# Patient Record
Sex: Female | Born: 1960 | Race: Black or African American | Hispanic: No | Marital: Married | Smoking: Never smoker
Health system: Southern US, Community
[De-identification: ages and names within clinical notes are randomized; demographics above are authoritative.]

## PROBLEM LIST (undated history)

## (undated) DIAGNOSIS — H543 Unqualified visual loss, both eyes: Secondary | ICD-10-CM

## (undated) DIAGNOSIS — I639 Cerebral infarction, unspecified: Secondary | ICD-10-CM

## (undated) DIAGNOSIS — I10 Essential (primary) hypertension: Secondary | ICD-10-CM

## (undated) DIAGNOSIS — E119 Type 2 diabetes mellitus without complications: Secondary | ICD-10-CM

## (undated) HISTORY — PX: TONSILLECTOMY: SUR1361

## (undated) HISTORY — PX: ABDOMINAL HYSTERECTOMY: SHX81

---

## 2006-12-09 ENCOUNTER — Emergency Department: Payer: Self-pay | Admitting: Emergency Medicine

## 2014-12-06 ENCOUNTER — Inpatient Hospital Stay: Payer: Self-pay | Admitting: Internal Medicine

## 2014-12-06 LAB — COMPREHENSIVE METABOLIC PANEL
ALK PHOS: 93 U/L
ALT: 17 U/L
Albumin: 3.8 g/dL
Anion Gap: 8 (ref 7–16)
BUN: 26 mg/dL — ABNORMAL HIGH
Bilirubin,Total: 0.3 mg/dL
CREATININE: 1.29 mg/dL — AB
Calcium, Total: 8.9 mg/dL
Chloride: 101 mmol/L
Co2: 29 mmol/L
EGFR (African American): 54 — ABNORMAL LOW
EGFR (Non-African Amer.): 47 — ABNORMAL LOW
GLUCOSE: 114 mg/dL — AB
Potassium: 3.7 mmol/L
SGOT(AST): 36 U/L
SODIUM: 138 mmol/L
Total Protein: 8.1 g/dL

## 2014-12-06 LAB — CBC
HCT: 40 % (ref 35.0–47.0)
HGB: 13 g/dL (ref 12.0–16.0)
MCH: 28 pg (ref 26.0–34.0)
MCHC: 32.6 g/dL (ref 32.0–36.0)
MCV: 86 fL (ref 80–100)
Platelet: 329 10*3/uL (ref 150–440)
RBC: 4.65 10*6/uL (ref 3.80–5.20)
RDW: 14.8 % — ABNORMAL HIGH (ref 11.5–14.5)
WBC: 5.9 10*3/uL (ref 3.6–11.0)

## 2014-12-06 LAB — BASIC METABOLIC PANEL
ANION GAP: 6 — AB (ref 7–16)
BUN: 22 mg/dL — ABNORMAL HIGH
CO2: 25 mmol/L
Calcium, Total: 8.1 mg/dL — ABNORMAL LOW
Chloride: 102 mmol/L
Creatinine: 1.07 mg/dL — ABNORMAL HIGH
EGFR (Non-African Amer.): 58 — ABNORMAL LOW
GLUCOSE: 153 mg/dL — AB
Potassium: 3.6 mmol/L
Sodium: 133 mmol/L — ABNORMAL LOW

## 2014-12-06 LAB — URINALYSIS, COMPLETE
Bacteria: NONE SEEN
Bilirubin,UR: NEGATIVE
Glucose,UR: >=500 mg/dL (ref 0–75)
Hyaline Cast: 6
Ketone: NEGATIVE
LEUKOCYTE ESTERASE: NEGATIVE
NITRITE: NEGATIVE
Ph: 5 (ref 4.5–8.0)
Specific Gravity: 1.011 (ref 1.003–1.030)
Squamous Epithelial: 1
WBC UR: 1 /HPF (ref 0–5)

## 2014-12-06 LAB — TROPONIN I: Troponin-I: <0.03 ng/mL

## 2014-12-07 LAB — BASIC METABOLIC PANEL
Anion Gap: 6 — ABNORMAL LOW (ref 7–16)
BUN: 21 mg/dL — ABNORMAL HIGH
Calcium, Total: 8.3 mg/dL — ABNORMAL LOW
Chloride: 101 mmol/L
Co2: 27 mmol/L
Creatinine: 1.36 mg/dL — ABNORMAL HIGH
EGFR (African American): 51 — ABNORMAL LOW
EGFR (Non-African Amer.): 44 — ABNORMAL LOW
Glucose: 265 mg/dL — ABNORMAL HIGH
Potassium: 4.1 mmol/L
Sodium: 134 mmol/L — ABNORMAL LOW

## 2014-12-07 LAB — HEMOGLOBIN A1C: Hemoglobin A1C: 11.1 % — ABNORMAL HIGH

## 2014-12-07 LAB — TSH: Thyroid Stimulating Horm: 0.284 u[IU]/mL — ABNORMAL LOW

## 2015-01-10 NOTE — H&P (Signed)
PATIENT NAME:  [name] Gilbert, Hendrix MR#:  [id number] DATE OF BIRTH:  [birth year]  DATE OF ADMISSION:  12/06/2014  REFERRING PHYSICIAN: Greenwood SinkJade J. Dolores FrameSung, M.D.    PRIMARY CARE PHYSICIAN: Nonlocal.   ADMIT DIAGNOSIS: Hypoglycemia and hypothermia.   HISTORY OF PRESENT ILLNESS: This is a 54 year old, African American female, who presents to the Emergency Department via the EMS after her blood sugar was too low to detect on her home glucometer. The patient had been lying in bed eating dinner after taking her insulin, when she reported feeling very hot. When her family members touched her skin, they report that she actually felt icy cold. The patient was diaphoretic, but complained of no pain. She had no nausea, vomiting or diarrhea. When she became more and more lethargic, the family members called the EMS, who confirmed that her blood sugar was too low to read and gave the patient glucagon in the field. Upon arrival to the Emergency Department, she was found to have a blood sugar of 14, and was given an amp of D50, which improved her blood sugar to more than 100. However, the patient's blood sugar quickly dropped again to approximately 40. She was also found to be hypothermic, which prompted the Emergency Department to call for admission.   REVIEW OF SYSTEMS: The patient is very lethargic upon my physical examination, and thus cannot contribute to her review of systems. She is able to tell me that she is not hurting anywhere, nor is she short of breath. Her family members report that the patient did not complain of anything but feeling hot, as stated before.   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   PAST SURGICAL HISTORY: C-section.   SOCIAL HISTORY: The patient lives with her husband. She does not smoke, drink, or do any drugs.    FAMILY HISTORY: There are no chronic medical illnesses that run in the family that the patient's daughter is aware of.   MEDICATIONS:  1. Aspirin 81 mg 1 tablet p.o. daily.  2. Ciprofloxacin 500 mg 1 tablet p.o. every 12 hours. Prescription is complete.  3.   Gabapentin 300 mg 1 capsule p.o. daily as needed for pain.  4. Gabapentin 300 mg 2 capsules p.o. at bedtime.  5. Insulin glargine 55 units subcutaneously at bedtime.  6. Lisinopril 10 mg 1 tablet p.o. daily.  7. Lovastatin 40 mg 1 tablet p.o. daily.  8. Nitrofurantoin macrocrystals 100 mg 1 capsule p.o. b.i.d.   ALLERGIES: CODEINE AND PENICILLIN.   PERTINENT LABORATORY RESULTS AND RADIOGRAPHIC FINDINGS: Serum glucose is 114 following glucagon and D50. BUN is 26, creatinine 1.29, serum sodium 138, potassium is 3.7, chloride is 101, bicarbonate 29, calcium is 8.9, serum albumin is 3.8, alkaline phosphatase 93, AST 36, ALT 17.   Troponin is negative.   White blood cell count 5.9, hemoglobin 13, hematocrit 40, platelet count 329,000. MCV is 86.   Urinalysis is negative for infection.   Chest x-ray shows no acute cardiopulmonary process.   PHYSICAL EXAMINATION:  VITAL SIGNS: Temperature at the time of admission is 95.1, pulse 62, respirations 10, blood pressure 138/79, pulse oximetry is 100% on room air.  GENERAL: The patient is somnolent and lethargic. She is oriented to person and place. She is in no apparent distress.  HEENT: Normocephalic, atraumatic. Pupils are equal and round, but difficult to ascertain  reactivity to light. Mucous membranes are moist.  NECK: Trachea is midline. No adenopathy. Thyroid is nonpalpable and nontender.  CHEST: Symmetric and atraumatic.  CARDIOVASCULAR: Regular rate and rhythm. Normal S1, S2. No rubs, clicks, or murmurs appreciated.  LUNGS: Clear to auscultation bilaterally. Normal effort and excursion.  ABDOMEN: Positive bowel sounds. Soft, nontender, nondistended. No hepatosplenomegaly.   GENITOURINARY: Deferred.  MUSCULOSKELETAL: The patient does not cooperate in strength or range of motion examination. I have also not observed her gait, as she will not get out of bed.  SKIN: Warm and dry. There are no rashes or lesions.  EXTREMITIES: No clubbing, cyanosis; however, there is trace edema of the left lower extremity at the ankle.  NEUROLOGIC: Cranial nerves II, and then IV through XII, are grossly intact. The patient is unable to see, and thus cranial nerve III is not working properly.  PSYCHIATRIC: It is difficult to ascertain mood, affect, judgment, or insight as the patient is so lethargic.   ASSESSMENT AND PLAN: This 54 year old female is admitted for hypoglycemia and hypothermia.   1. Hypoglycemia. Initially, the patient's blood sugar was 14 despite glucagon and D50. It was persistently low even after starting D5- half normal saline in the Emergency Department so I have switched her to D10W. We will monitor her blood sugar every hour for the next 3 hours. If it improves, we can extend the interval that we check her blood sugar. The patient does not meet septic criteria, at this time, but has recently completed antibiotics for a urinary tract infection. She does not appear to have a urinary tract infection at this juncture, but given her clinical findings, I will give 1 dose of ceftriaxone and vancomycin. Also, it is unclear as to the etiology of her hypoglycemia, at this time. The patient's daughter reports that the primary care doctor has written for a new version of her 70/30 insulin that she had been taking. This is not the same version of insulin that she has been taking. We need to clarify the type and dose of insulin the patient has been prescribed and adjust as necessary.  2. Hypothermia. The patient has a warming blanket on, at this time, and we will continue per protocol until her core temperature is improved. 3. Diabetes type 2. We will hold the patient's insulin until her  blood sugar is consistently improved.  4. Hypertension. Continue lisinopril.  5. Hyperlipidemia. Continue statins.  6. Cerebrovascular accident. The patient is currently blind. It is unclear if she has cortical blindness at this time. In better lighting conditions and when the patient is more cooperative, we will do a more thorough ophthalmologic examination.  7. Deep vein thrombosis prophylaxis: Heparin.  8. Gastrointestinal prophylaxis: None.   CODE STATUS: The patient is a FULL CODE.   TIME SPENT ON ADMISSION ORDERS AND CRITICAL PATIENT CARE: Approximately 45 minutes.    ____________________________ Kelton Pillar. Sheryle Hail, MD msd:JT D: 12/06/2014 09:54:28 ET T: 12/06/2014 10:26:52 ET JOB#: [id number]  cc: Kelton Pillar. Sheryle Hail, MD, <Dictator> Kelton Pillar Mariachristina Holle MD ELECTRONICALLY SIGNED 12/07/2014 0:33

## 2015-01-10 NOTE — Discharge Summary (Signed)
PATIENT NAME:  [name] Gilbert, [name] MR#:  [id number] DATE OF BIRTH:  1960/12/15  DATE OF ADMISSION:  12/06/2014 DATE OF DISCHARGE:  12/07/2014  For a detailed note, please take a look at the history and physical done on admission by Dr. Joycelyn RuaMichael Diamond.   DISCHARGE DIAGNOSES:  1.  Altered mental status, likely metabolic encephalopathy from hypoglycemia.  2.  Hypoglycemia, hypertension, diabetic neuropathy, diabetes, history of previous cerebrovascular accident.   DISCHARGE DIET: The patient is being discharged on a low-sodium, low-fat, carbohydrate-Controlled diet.   DISCHARGE ACTIVITY: As tolerated.   DISCHARGE FOLLOWUP: [facility] in the next 1 to 2 weeks.   DISCHARGE MEDICATIONS: Aspirin 81 mg daily, gabapentin 300 mg daily as needed, lisinopril 10 mg daily, lovastatin 40 mg daily, gabapentin 300 mg 2 caps at bedtime, Novolin 70/30 20 units in the morning and 10 units in the evening.   PERTINENT STUDIES DONE DURING THE HOSPITAL COURSE: A CT scan of the head showing prior left PCA territory stroke with encephalomalacia.   HOSPITAL COURSE: This is a 54 year old female with medical problems as mentioned above who presented to the hospital with altered mental status and noted to be severely hypoglycemic with blood sugars in the teens.  1.  Altered mental status. This was likely metabolic encephalopathy secondary from the severe hypoglycemia. The patient was placed on a D10 drip. Her blood sugars have remained much improved in the past 24 hours. Her mental status is back to baseline. Therefore, she is being discharged home. Her CT head showed old strokes, but no evidence of acute CVA presently.  2.  Hypoglycemia. This was likely the cause of the patient's altered mental status. The patient had recently been switched from Lantus to Novolin 70/30, but she was not really monitoring her blood sugars very well, and she was not eating very well prior to coming in.  That is probably what led to her hypoglycemia. At this point, I am reducing her dose of Novolin 70/30 from 40 units to 20 units in the morning and from 20 units in the evening to 10 units in the evening and further titrations to her insulin can be done as an outpatient.  3.  Acute renal failure. We do not have a baseline creatinine to compare with. She probably has underlying chronic kidney disease. This can be further followed up as an outpatient. 4.  Hyponatremia. This was likely secondary to mild hypoglycemia and has since then improved and resolved.  5.  Hypothermia. This was thought to be initially secondary to sepsis, but that has been ruled out. Her hypothermia cause is probably her hypoglycemia, which is now improved and her temperature is stable.   CODE STATUS: FULL.  DISPOSITION: She is being discharged home.   TIME SPENT ON DISCHARGE: 40 minutes. ____________________________ Rolly PancakeVivek J. Cherlynn KaiserSainani, MD vjs:sb D: 12/07/2014 16:30:57 ET T: 12/08/2014 07:33:09 ET JOB#: [id number]  cc: Rolly PancakeVivek J. Cherlynn KaiserSainani, MD, <Dictator> Houston SirenVIVEK J Chakira Jachim MD ELECTRONICALLY SIGNED 12/17/2014 16:19

## 2015-05-03 ENCOUNTER — Emergency Department: Payer: BLUE CROSS/BLUE SHIELD

## 2015-05-03 ENCOUNTER — Encounter: Payer: Self-pay | Admitting: Emergency Medicine

## 2015-05-03 ENCOUNTER — Other Ambulatory Visit: Payer: Self-pay

## 2015-05-03 ENCOUNTER — Emergency Department
Admission: EM | Admit: 2015-05-03 | Discharge: 2015-05-03 | Disposition: A | Discharge status: Home / Self Care | Payer: BLUE CROSS/BLUE SHIELD | Attending: Emergency Medicine | Admitting: Emergency Medicine

## 2015-05-03 DIAGNOSIS — M6281 Muscle weakness (generalized): Secondary | ICD-10-CM | POA: Insufficient documentation

## 2015-05-03 DIAGNOSIS — G43809 Other migraine, not intractable, without status migrainosus: Secondary | ICD-10-CM

## 2015-05-03 DIAGNOSIS — R519 Headache, unspecified: Secondary | ICD-10-CM

## 2015-05-03 DIAGNOSIS — R4182 Altered mental status, unspecified: Secondary | ICD-10-CM | POA: Diagnosis present

## 2015-05-03 DIAGNOSIS — Z79899 Other long term (current) drug therapy: Secondary | ICD-10-CM | POA: Diagnosis not present

## 2015-05-03 DIAGNOSIS — Z880 Allergy status to penicillin: Secondary | ICD-10-CM | POA: Insufficient documentation

## 2015-05-03 DIAGNOSIS — Z794 Long term (current) use of insulin: Secondary | ICD-10-CM | POA: Diagnosis not present

## 2015-05-03 DIAGNOSIS — E119 Type 2 diabetes mellitus without complications: Secondary | ICD-10-CM | POA: Insufficient documentation

## 2015-05-03 DIAGNOSIS — R531 Weakness: Secondary | ICD-10-CM

## 2015-05-03 DIAGNOSIS — R51 Headache: Secondary | ICD-10-CM

## 2015-05-03 DIAGNOSIS — I10 Essential (primary) hypertension: Secondary | ICD-10-CM | POA: Insufficient documentation

## 2015-05-03 HISTORY — DX: Cerebral infarction, unspecified: I63.9

## 2015-05-03 HISTORY — DX: Type 2 diabetes mellitus without complications: E11.9

## 2015-05-03 HISTORY — DX: Essential (primary) hypertension: I10

## 2015-05-03 LAB — LACTIC ACID, PLASMA: LACTIC ACID, VENOUS: 1.5 mmol/L (ref 0.5–2.0)

## 2015-05-03 LAB — CBC WITH DIFFERENTIAL/PLATELET
BASOS PCT: 1 %
Basophils Absolute: 0.1 10*3/uL (ref 0–0.1)
EOS ABS: 0 10*3/uL (ref 0–0.7)
Eosinophils Relative: 1 %
HCT: 42.4 % (ref 35.0–47.0)
Hemoglobin: 13.8 g/dL (ref 12.0–16.0)
Lymphocytes Relative: 19 %
Lymphs Abs: 1.8 10*3/uL (ref 1.0–3.6)
MCH: 27.8 pg (ref 26.0–34.0)
MCHC: 32.5 g/dL (ref 32.0–36.0)
MCV: 85.8 fL (ref 80.0–100.0)
MONO ABS: 0.3 10*3/uL (ref 0.2–0.9)
MONOS PCT: 3 %
Neutro Abs: 7.4 10*3/uL — ABNORMAL HIGH (ref 1.4–6.5)
Neutrophils Relative %: 76 %
PLATELETS: 319 10*3/uL (ref 150–440)
RBC: 4.94 MIL/uL (ref 3.80–5.20)
RDW: 15 % — AB (ref 11.5–14.5)
WBC: 9.6 10*3/uL (ref 3.6–11.0)

## 2015-05-03 LAB — COMPREHENSIVE METABOLIC PANEL
ALK PHOS: 106 U/L (ref 38–126)
ALT: 22 U/L (ref 14–54)
AST: 35 U/L (ref 15–41)
Albumin: 4.2 g/dL (ref 3.5–5.0)
Anion gap: 11 (ref 5–15)
BUN: 19 mg/dL (ref 6–20)
CALCIUM: 9.2 mg/dL (ref 8.9–10.3)
CO2: 28 mmol/L (ref 22–32)
CREATININE: 1.2 mg/dL — AB (ref 0.44–1.00)
Chloride: 98 mmol/L — ABNORMAL LOW (ref 101–111)
GFR calc non Af Amer: 50 mL/min — ABNORMAL LOW (ref >60)
GFR, EST AFRICAN AMERICAN: 57 mL/min — AB (ref >60)
GLUCOSE: 169 mg/dL — AB (ref 65–99)
Potassium: 4.2 mmol/L (ref 3.5–5.1)
SODIUM: 137 mmol/L (ref 135–145)
Total Bilirubin: 1.2 mg/dL (ref 0.3–1.2)
Total Protein: 9 g/dL — ABNORMAL HIGH (ref 6.5–8.1)

## 2015-05-03 LAB — URINALYSIS COMPLETE WITH MICROSCOPIC (ARMC ONLY)
Bilirubin Urine: NEGATIVE
GLUCOSE, UA: NEGATIVE mg/dL
Leukocytes, UA: NEGATIVE
Nitrite: POSITIVE — AB
Protein, ur: >500 mg/dL — AB
Specific Gravity, Urine: 1.015 (ref 1.005–1.030)
pH: 6 (ref 5.0–8.0)

## 2015-05-03 LAB — GLUCOSE, CAPILLARY: GLUCOSE-CAPILLARY: 122 mg/dL — AB (ref 65–99)

## 2015-05-03 LAB — TROPONIN I: TROPONIN I: 0.04 ng/mL — AB (ref <0.031)

## 2015-05-03 MED ORDER — ACETAMINOPHEN 325 MG PO TABS
650.0000 mg | ORAL_TABLET | Freq: Once | ORAL | Status: AC
  Administered 2015-05-03: 650 mg via ORAL
  Filled 2015-05-03: qty 2

## 2015-05-03 MED ORDER — SODIUM CHLORIDE 0.9 % IV BOLUS (SEPSIS)
1000.0000 mL | Freq: Once | INTRAVENOUS | Status: AC
  Administered 2015-05-03: 1000 mL via INTRAVENOUS

## 2015-05-03 MED ORDER — METOCLOPRAMIDE HCL 5 MG/ML IJ SOLN
10.0000 mg | Freq: Once | INTRAMUSCULAR | Status: AC
  Administered 2015-05-03: 10 mg via INTRAVENOUS
  Filled 2015-05-03: qty 2

## 2015-05-03 MED ORDER — MAGNESIUM SULFATE 2 GM/50ML IV SOLN
2.0000 g | Freq: Once | INTRAVENOUS | Status: AC
  Administered 2015-05-03: 2 g via INTRAVENOUS
  Filled 2015-05-03: qty 50

## 2015-05-03 NOTE — Discharge Instructions (Signed)
It is unclear what caused her general weakness, but we discussed the possibility that this was an atypical migraine. Your blood tests, CT scan of your head, and vital signs, were all within reason. He felt better after we gave the medicine to treat her migraine. Follow-up with your regular doctor. Return to the emergency department if you have severe headache, focal weakness, or other urgent concerns.   Migraine Headache A migraine headache is very bad, throbbing pain on one or both sides of your head. Talk to your doctor about what things may bring on (trigger) your migraine headaches. HOME CARE  Only take medicines as told by your doctor.  Lie down in a dark, quiet room when you have a migraine.  Keep a journal to find out if certain things bring on migraine headaches. For example, write down:  What you eat and drink.  How much sleep you get.  Any change to your diet or medicines.  Lessen how much alcohol you drink.  Quit smoking if you smoke.  Get enough sleep.  Lessen any stress in your life.  Keep lights dim if bright lights bother you or make your migraines worse. GET HELP RIGHT AWAY IF:   Your migraine becomes really bad.  You have a fever.  You have a stiff neck.  You have trouble seeing.  Your muscles are weak, or you lose muscle control.  You lose your balance or have trouble walking.  You feel like you will pass out (faint), or you pass out.  You have really bad symptoms that are different than your first symptoms. MAKE SURE YOU:   Understand these instructions.  Will watch your condition.  Will get help right away if you are not doing well or get worse. Document Released: 06/06/2008 Document Revised: 11/20/2011 Document Reviewed: 05/05/2013 [facility] Patient Information 2015 [place], [place]. This information is not intended to replace advice given to you by your health care provider. Make sure you discuss any questions you have with your health care  provider.

## 2015-05-03 NOTE — ED Notes (Signed)
Pt transported to CT via stretcher with Clydie Braun, RN on telemetry

## 2015-05-03 NOTE — ED Notes (Signed)
Dr Carollee Massed aware of elevated troponin, no additional orders at this time.

## 2015-05-03 NOTE — ED Notes (Signed)
Patients husband house # [phone] ;  Cell # [phone]  [name])

## 2015-05-03 NOTE — ED Notes (Signed)
Pts husband states pt was last seen well at 1030 last pm, states this am, his wife was not acting right, cold, not responding to verbal stimuli, states she vomited and passed out at some point this am. Grips weak and equal. Pt weak upon standing from car to chair. Had a headache last night however, denies ha now.

## 2015-05-03 NOTE — ED Provider Notes (Addendum)
[facility] Emergency Department Provider Note  ____________________________________________  Time seen: 8:05 AM  I have reviewed the triage vital signs and the nursing notes.   HISTORY  Chief Complaint Altered Mental Status     HPI [name] Gilbert is a 54 y.o. female who has a history of a stroke in the past now presents with some altered mental status and general weakness. Apparently she became nauseous and was vomiting last night. She reports she had a syncopal episode. Her husband, who works third shift, had called her this morning and she told him he needed to come home. When he got there he found her less communicative but still verbal. He thought her sugar may be low and he checked that. It was normal. The patient was subsequently brought emergency department.  The patient denies any focal pain. She is able to tell me "I just don't feel right". She is soft-spoken. She does follow commands and moves all 4 extremities, although with notable weakness.   Past medical history: Hypertension CVA Diabetes  There are no active problems to display for this patient.   History reviewed. No pertinent past surgical history.  Current Outpatient Rx  Name  Route  Sig  Dispense  Refill  . atorvastatin (LIPITOR) 40 MG tablet   Oral   Take 40 mg by mouth daily.         . carvedilol (COREG) 12.5 MG tablet   Oral   Take 12.5 mg by mouth 2 (two) times daily with a meal.         . insulin NPH-regular Human (NOVOLIN 70/30) (70-30) 100 UNIT/ML injection   Subcutaneous   Inject 10-18 Units into the skin 2 (two) times daily with a meal. 10 units in the morning and 18 units in the evening.         Marland Kitchen lisinopril-hydrochlorothiazide (PRINZIDE,ZESTORETIC) 20-12.5 MG per tablet   Oral   Take 2 tablets by mouth daily.           Allergies Ampicillin; Penicillins; Amoxicillin; Codeine; Metformin; and Statins  History reviewed. No pertinent family  history.  Social History Social History  Substance Use Topics  . Smoking status: Never Smoker   . Smokeless tobacco: None  . Alcohol Use: None    Review of Systems  Constitutional: Negative for fever but patient felt cold ENT: Negative for sore throat. Cardiovascular: Negative for chest pain. Respiratory: Negative for shortness of breath. Gastrointestinal: Negative for abdominal pain, vomiting and diarrhea. Genitourinary: Negative for dysuria. Musculoskeletal: No myalgias or injuries. Skin: Negative for rash. Neurological: Negative for headaches   10-point ROS otherwise negative.  ____________________________________________   PHYSICAL EXAM:  VITAL SIGNS: ED Triage Vitals  Enc Vitals Group     BP 05/03/15 0810 140/83 mmHg     Pulse Rate 05/03/15 0810 97     Resp 05/03/15 0810 18     Temp 05/03/15 0810 98.4 F (36.9 C)     Temp Source 05/03/15 0810 Oral     SpO2 05/03/15 0810 98 %     Weight 05/03/15 0810 180 lb (81.647 kg)     Height 05/03/15 0810  (1.676 m)     Head Cir --      Peak Flow --      Pain Score --      Pain Loc --      Pain Edu? --      Excl. in GC? --     Constitutional: Very soft spoken. She opens  her eyes but not much. She has not re-interactive. She appears overall lethargic. I need to get fairly close to her to understand her speech. She is basically able to speak at a level just above a whisper to tell me that she just doesn't feel right. She does answer questions appropriately and has an intact thought process. ENT   Head: Normocephalic and atraumatic.   Nose: No congestion/rhinnorhea.   Mouth/Throat: Mucous membranes are moist. Cardiovascular: Normal rate at 86, regular rhythm, no murmur noted Respiratory:  Normal respiratory effort, no tachypnea.    Breath sounds are clear and equal bilaterally.  Gastrointestinal: Soft and nontender. No distention.  Back: No muscle spasm, no tenderness, no CVA tenderness. Musculoskeletal:  No deformity noted. Nontender with normal range of motion in all extremities.  No noted edema. Neurologic:  Overall lethargic. 4+ out of 5 strength in all 4 extremities. Sensation intact. Skin:  Skin is warm, dry. No rash noted. Psychiatric: Quiet, lethargic, but with intact thought process.  ____________________________________________    LABS (pertinent positives/negatives)  Labs Reviewed  COMPREHENSIVE METABOLIC PANEL - Abnormal; Notable for the following:    Chloride 98 (*)    Glucose, Bld 169 (*)    Creatinine, Ser 1.20 (*)    Total Protein 9.0 (*)    GFR calc non Af Amer 50 (*)    GFR calc Af Amer 57 (*)    All other components within normal limits  CBC WITH DIFFERENTIAL/PLATELET - Abnormal; Notable for the following:    RDW 15.0 (*)    Neutro Abs 7.4 (*)    All other components within normal limits  TROPONIN I - Abnormal; Notable for the following:    Troponin I 0.04 (*)    All other components within normal limits  URINALYSIS COMPLETEWITH MICROSCOPIC ([facility] ONLY) - Abnormal; Notable for the following:    Color, Urine YELLOW (*)    APPearance HAZY (*)    Ketones, ur TRACE (*)    Hgb urine dipstick 2+ (*)    Protein, ur >500 (*)    Nitrite POSITIVE (*)    Bacteria, UA MANY (*)    Squamous Epithelial / LPF 0-5 (*)    All other components within normal limits  GLUCOSE, CAPILLARY - Abnormal; Notable for the following:    Glucose-Capillary 122 (*)    All other components within normal limits  LACTIC ACID, PLASMA  LACTIC ACID, PLASMA     ____________________________________________   EKG    ____________________________________________    RADIOLOGY   FINDINGS: Bilateral occipital infarcts, left larger than right. No acute intracranial abnormality. Specifically, no hemorrhage, hydrocephalus, mass lesion, acute infarction, or significant intracranial injury. No acute calvarial abnormality. Visualized paranasal sinuses and mastoids clear. Orbital soft  tissues unremarkable.  IMPRESSION: No acute intracranial abnormality.   ____________________________________________ ____________________________________________   INITIAL IMPRESSION / ASSESSMENT AND PLAN / ED COURSE  Pertinent labs & imaging results that were available during my care of the patient were reviewed by me and considered in my medical decision making (see chart for details).  54 year old female with altered mental status and general weakness. It is unclear the cause. She has had a stroke before. This weakness does not appear to have a focal nature to it. CT scan and labwork pending.  ----------------------------------------- 12:48 PM on 05/03/2015 -----------------------------------------  Blood tests overall look okay. Patient may have a urinary tract infection with 6-30 white blood cells, nitrite positive, and many bacteria. Her CT scan was negative.  Patient's vital signs continued  to be quite normal. She is unchanged currently, with ongoing general weakness and a soft voice. She complains of a headache. Upon further discussion, the patient reports she used to have migraine sometime ago. With all other tests and evaluation overall looking well, I suspect she may have a migraine. We will treat her with Reglan and magnesium.   ----------------------------------------- 3:40 PM on 05/03/2015 -----------------------------------------  After treatment for migraine, the patient looks much better. She reports she feels better. She is more alert and communicative. Her voice and strength seem to be stronger. She has eaten some food. Her husband reports that she looks much better as well as. The patient feels ready to go home.  We have discussed further the possibility that this was a migraine. We she will follow-up with her primary doctor at [facility]. ____________________________________________   FINAL CLINICAL IMPRESSION(S) / ED DIAGNOSES  Final diagnoses:  General weakness   Other type of migraine  Acute nonintractable headache, unspecified headache type      Darien Ramus, MD 05/03/15 1545  Darien Ramus, MD 05/03/15 [phone]

## 2015-12-13 ENCOUNTER — Emergency Department
Admission: EM | Admit: 2015-12-13 | Discharge: 2015-12-13 | Disposition: A | Discharge status: Home / Self Care | Payer: BLUE CROSS/BLUE SHIELD | Attending: Emergency Medicine | Admitting: Emergency Medicine

## 2015-12-13 ENCOUNTER — Emergency Department: Payer: BLUE CROSS/BLUE SHIELD

## 2015-12-13 ENCOUNTER — Encounter: Payer: Self-pay | Admitting: *Deleted

## 2015-12-13 DIAGNOSIS — E11649 Type 2 diabetes mellitus with hypoglycemia without coma: Secondary | ICD-10-CM | POA: Insufficient documentation

## 2015-12-13 DIAGNOSIS — R748 Abnormal levels of other serum enzymes: Secondary | ICD-10-CM | POA: Insufficient documentation

## 2015-12-13 DIAGNOSIS — Z794 Long term (current) use of insulin: Secondary | ICD-10-CM | POA: Diagnosis not present

## 2015-12-13 DIAGNOSIS — N390 Urinary tract infection, site not specified: Secondary | ICD-10-CM | POA: Diagnosis not present

## 2015-12-13 DIAGNOSIS — Z79899 Other long term (current) drug therapy: Secondary | ICD-10-CM | POA: Insufficient documentation

## 2015-12-13 DIAGNOSIS — E162 Hypoglycemia, unspecified: Secondary | ICD-10-CM

## 2015-12-13 DIAGNOSIS — Z8673 Personal history of transient ischemic attack (TIA), and cerebral infarction without residual deficits: Secondary | ICD-10-CM | POA: Insufficient documentation

## 2015-12-13 DIAGNOSIS — R4189 Other symptoms and signs involving cognitive functions and awareness: Secondary | ICD-10-CM

## 2015-12-13 DIAGNOSIS — I10 Essential (primary) hypertension: Secondary | ICD-10-CM | POA: Insufficient documentation

## 2015-12-13 LAB — LIPASE, BLOOD: LIPASE: 14 U/L (ref 11–51)

## 2015-12-13 LAB — ACETAMINOPHEN LEVEL: Acetaminophen (Tylenol), Serum: <10 ug/mL — ABNORMAL LOW (ref 10–30)

## 2015-12-13 LAB — PROTIME-INR
INR: 1.01
Prothrombin Time: 13.5 seconds (ref 11.4–15.0)

## 2015-12-13 LAB — URINALYSIS COMPLETE WITH MICROSCOPIC (ARMC ONLY)
BILIRUBIN URINE: NEGATIVE
GLUCOSE, UA: 50 mg/dL — AB
KETONES UR: NEGATIVE mg/dL
Leukocytes, UA: NEGATIVE
Nitrite: NEGATIVE
PH: 6 (ref 5.0–8.0)
Protein, ur: 100 mg/dL — AB
Specific Gravity, Urine: 1.01 (ref 1.005–1.030)

## 2015-12-13 LAB — GLUCOSE, CAPILLARY
GLUCOSE-CAPILLARY: 163 mg/dL — AB (ref 65–99)
GLUCOSE-CAPILLARY: 135 mg/dL — AB (ref 65–99)
GLUCOSE-CAPILLARY: 139 mg/dL — AB (ref 65–99)
GLUCOSE-CAPILLARY: 132 mg/dL — AB (ref 65–99)
Glucose-Capillary: 55 mg/dL — ABNORMAL LOW (ref 65–99)
Glucose-Capillary: 53 mg/dL — ABNORMAL LOW (ref 65–99)
Glucose-Capillary: 73 mg/dL (ref 65–99)

## 2015-12-13 LAB — AMMONIA: Ammonia: 38 umol/L — ABNORMAL HIGH (ref 9–35)

## 2015-12-13 LAB — COMPREHENSIVE METABOLIC PANEL
ALBUMIN: 4.1 g/dL (ref 3.5–5.0)
ALT: 327 U/L — ABNORMAL HIGH (ref 14–54)
ANION GAP: 6 (ref 5–15)
AST: 375 U/L — ABNORMAL HIGH (ref 15–41)
Alkaline Phosphatase: 197 U/L — ABNORMAL HIGH (ref 38–126)
BUN: 26 mg/dL — ABNORMAL HIGH (ref 6–20)
CO2: 25 mmol/L (ref 22–32)
Calcium: 9 mg/dL (ref 8.9–10.3)
Chloride: 106 mmol/L (ref 101–111)
Creatinine, Ser: 1.12 mg/dL — ABNORMAL HIGH (ref 0.44–1.00)
GFR calc non Af Amer: 54 mL/min — ABNORMAL LOW (ref >60)
GLUCOSE: 51 mg/dL — AB (ref 65–99)
POTASSIUM: 3.8 mmol/L (ref 3.5–5.1)
SODIUM: 137 mmol/L (ref 135–145)
TOTAL PROTEIN: 9.3 g/dL — AB (ref 6.5–8.1)
Total Bilirubin: 0.6 mg/dL (ref 0.3–1.2)

## 2015-12-13 LAB — CBC
HEMATOCRIT: 40.4 % (ref 35.0–47.0)
Hemoglobin: 13.6 g/dL (ref 12.0–16.0)
MCH: 29.1 pg (ref 26.0–34.0)
MCHC: 33.6 g/dL (ref 32.0–36.0)
MCV: 86.5 fL (ref 80.0–100.0)
PLATELETS: 246 10*3/uL (ref 150–440)
RBC: 4.68 MIL/uL (ref 3.80–5.20)
RDW: 15.4 % — AB (ref 11.5–14.5)
WBC: 9.1 10*3/uL (ref 3.6–11.0)

## 2015-12-13 LAB — APTT: aPTT: 30 seconds (ref 24–36)

## 2015-12-13 LAB — TROPONIN I: Troponin I: <0.03 ng/mL (ref <0.031)

## 2015-12-13 LAB — LACTIC ACID, PLASMA
LACTIC ACID, VENOUS: 1.6 mmol/L (ref 0.5–2.0)
LACTIC ACID, VENOUS: 1.5 mmol/L (ref 0.5–2.0)

## 2015-12-13 MED ORDER — DEXTROSE 50 % IV SOLN
INTRAVENOUS | Status: AC
  Administered 2015-12-13: 50 mL via INTRAVENOUS
  Filled 2015-12-13: qty 50

## 2015-12-13 MED ORDER — LEVOFLOXACIN 750 MG PO TABS
750.0000 mg | ORAL_TABLET | Freq: Every day | ORAL | Status: DC
Start: 2015-12-13 — End: 2016-01-28

## 2015-12-13 MED ORDER — LEVOFLOXACIN IN D5W 750 MG/150ML IV SOLN
750.0000 mg | Freq: Once | INTRAVENOUS | Status: AC
  Administered 2015-12-13: 750 mg via INTRAVENOUS
  Filled 2015-12-13: qty 150

## 2015-12-13 MED ORDER — LORAZEPAM 2 MG/ML IJ SOLN
1.0000 mg | Freq: Once | INTRAMUSCULAR | Status: AC
  Administered 2015-12-13: 1 mg via INTRAVENOUS

## 2015-12-13 MED ORDER — DEXTROSE 50 % IV SOLN
12.5000 g | Freq: Once | INTRAVENOUS | Status: AC
  Administered 2015-12-13: 50 mL via INTRAVENOUS

## 2015-12-13 NOTE — ED Provider Notes (Signed)
-----------------------------------------   11:03 AM on 12/13/2015 -----------------------------------------  Care was assumed Dr. Zenda AlpersWebster at approximately 9 AM pending results of ultrasound as well as additional lab work for evaluation of transaminitis, possibly hepatitis. Ultrasound shows possibly fatty infiltration with a question of trace ascites but no biliary ductal dilatation. [place] is mildly elevated at 38 but coags are normal also she has preserved synthetic function of the liver. Tylenol level is undetectable and patient reports that she has only taken 2 extra shaved Tylenol over the past 3 days. Repeat glucose is 132. She has not required any additional dextrose. She reports that she feels well and is requesting discharge patient is awake, alert, oriented in no acute distress with no complaints. I discussed return precautions with her, need for close PCP follow-up with likely GI follow-up for elevation of her AST and ALT and she voices understanding of this. DC with Levaquin for UTI.  Gayla DossEryka A Soriyah Osberg, MD 12/13/15 1115

## 2015-12-13 NOTE — Discharge Instructions (Signed)
You were seen in the emergency department for hypoglycemic episodes and were found to have elevation of your liver function tests as well as a urinary tract infection. Take all medications as prescribed and [facility] as possible. Return immediately to the emergency department if you develop any change in the color of your skin, abdominal pain, vomiting, diarrhea, fevers, chills, chest pain, back pain, difficulty breathing or for any other concerns.

## 2015-12-13 NOTE — ED Notes (Signed)
Pt given crackers and peanut butters, juice and applesauce.

## 2015-12-13 NOTE — ED Notes (Signed)
Pt woke husband at 0400 stated sugar was low, CBG at home 42. Given sprite prior to arrival. CBG at triage 55. Pt is tremorous and hypertensive.

## 2015-12-13 NOTE — ED Notes (Signed)
Patient ambulatory to bathroom.

## 2015-12-13 NOTE — ED Provider Notes (Signed)
[facility] Emergency Department Provider Note  ____________________________________________  Time seen: Approximately 426 AM  I have reviewed the triage vital signs and the nursing notes.   HISTORY  Chief Complaint Hypoglycemia    HPI [name] Gilbert is a 55 y.o. female who comes into the hospital today unresponsive. The patient's husband reports that he woke up saying that she did not feel right. She told her husband to touch her and her hands felt cold. The patient's husband checked her blood sugar and is found to be 42. He reports he gave her some Sprite and then her into the hospital. He reports that she has a history of diabetes as well as some strokes in the past and has some vision and memory challenges but she typically is verbal and can move around on her own. When she woke up at 4 AM she was moving and she was talking to her husband. She had no other complaints at the time. The husband denies any weakness at the patient has. She's not had any other symptoms as well. He was concerned so he brought her in for evaluation.   Past Medical History  Diagnosis Date  . Diabetes mellitus without complication (HCC)   . Hypertension   . Stroke [facility](HCC)     There are no active problems to display for this patient.   History reviewed. No pertinent past surgical history.  Current Outpatient Rx  Name  Route  Sig  Dispense  Refill  . atorvastatin (LIPITOR) 40 MG tablet   Oral   Take 40 mg by mouth daily.         . carvedilol (COREG) 12.5 MG tablet   Oral   Take 12.5 mg by mouth 2 (two) times daily with a meal.         . insulin NPH-regular Human (NOVOLIN 70/30) (70-30) 100 UNIT/ML injection   Subcutaneous   Inject 10-18 Units into the skin 2 (two) times daily with a meal. 10 units in the morning and 18 units in the evening.         Marland Kitchen. lisinopril-hydrochlorothiazide (PRINZIDE,ZESTORETIC) 20-12.5 MG per tablet   Oral   Take 2 tablets by mouth daily.            Allergies Ampicillin; Penicillins; Amoxicillin; Codeine; Metformin; and Statins  History reviewed. No pertinent family history.  Social History Social History  Substance Use Topics  . Smoking status: Never Smoker   . Smokeless tobacco: None  . Alcohol Use: None    Review of Systems Constitutional: Decreased responsiveness Eyes: No visual changes. ENT: No sore throat. Cardiovascular: Denies chest pain. Respiratory: Denies shortness of breath. Gastrointestinal: No abdominal pain.  No nausea, no vomiting.  No diarrhea.  No constipation. Genitourinary: Negative for dysuria. Musculoskeletal: Negative for back pain. Skin: Negative for rash. Neurological: Negative for headaches, focal weakness or numbness.  10-point ROS otherwise negative.  ____________________________________________   PHYSICAL EXAM:  VITAL SIGNS: ED Triage Vitals  Enc Vitals Group     BP 12/13/15 0430 225/111 mmHg     Pulse Rate 12/13/15 0430 95     Resp 12/13/15 0430 24     Temp 12/13/15 0628 97.8 F (36.6 C)     Temp Source 12/13/15 0628 Oral     SpO2 12/13/15 0430 100 %     Weight 12/13/15 0430 190 lb (86.183 kg)     Height 12/13/15 0430 5\' 2"  (1.575 m)     Head Cir --  Peak Flow --      Pain Score --      Pain Loc --      Pain Edu? --      Excl. in GC? --     Constitutional: Alert and not oriented and not responding sitting in the stretcher without responding.. Well appearing and in moderate distress. Eyes: Conjunctivae are normal. PERRL. EOMI. Head: Atraumatic. Nose: No congestion/rhinnorhea. Mouth/Throat: Mucous membranes are moist.  Oropharynx non-erythematous. Cardiovascular: Normal rate, regular rhythm. Grossly normal heart sounds.  Good peripheral circulation. Respiratory: Normal respiratory effort.  No retractions. Lungs CTAB. Gastrointestinal: Soft and nontender. No distention. Positive bowel sounds Musculoskeletal: No lower extremity tenderness nor edema.    Neurologic:  The patient was brought back to sitting in a wheelchair and staring. The patient is breathing without difficulty and she does move but does not follow commands. I did ask the patient she had pain and she did shake her head no when asked other questions the patient again is not responding. The patient also had some shaking episodes. Skin:  Skin is warm, dry and intact.  Psychiatric: Minimal responsiveness and staring off  ____________________________________________   LABS (all labs ordered are listed, but only abnormal results are displayed)  Labs Reviewed  CBC - Abnormal; Notable for the following:    RDW 15.4 (*)    All other components within normal limits  COMPREHENSIVE METABOLIC PANEL - Abnormal; Notable for the following:    Glucose, Bld 51 (*)    BUN 26 (*)    Creatinine, Ser 1.12 (*)    Total Protein 9.3 (*)    AST 375 (*)    ALT 327 (*)    Alkaline Phosphatase 197 (*)    GFR calc non Af Amer 54 (*)    All other components within normal limits  URINALYSIS COMPLETEWITH MICROSCOPIC ([facility] ONLY) - Abnormal; Notable for the following:    Color, Urine YELLOW (*)    APPearance HAZY (*)    Glucose, UA 50 (*)    Hgb urine dipstick 1+ (*)    Protein, ur 100 (*)    Bacteria, UA MANY (*)    Squamous Epithelial / LPF 0-5 (*)    All other components within normal limits  GLUCOSE, CAPILLARY - Abnormal; Notable for the following:    Glucose-Capillary 55 (*)    All other components within normal limits  GLUCOSE, CAPILLARY - Abnormal; Notable for the following:    Glucose-Capillary 135 (*)    All other components within normal limits  GLUCOSE, CAPILLARY - Abnormal; Notable for the following:    Glucose-Capillary 53 (*)    All other components within normal limits  GLUCOSE, CAPILLARY - Abnormal; Notable for the following:    Glucose-Capillary 163 (*)    All other components within normal limits  URINE CULTURE  TROPONIN I  LACTIC ACID, PLASMA  GLUCOSE, CAPILLARY   LACTIC ACID, PLASMA  LIPASE, BLOOD  CBG MONITORING, ED  CBG MONITORING, ED  CBG MONITORING, ED  CBG MONITORING, ED  CBG MONITORING, ED  CBG MONITORING, ED  CBG MONITORING, ED  CBG MONITORING, ED  CBG MONITORING, ED  CBG MONITORING, ED  CBG MONITORING, ED  CBG MONITORING, ED  CBG MONITORING, ED  CBG MONITORING, ED  CBG MONITORING, ED  CBG MONITORING, ED  CBG MONITORING, ED  CBG MONITORING, ED   ____________________________________________  EKG  ED ECG REPORT I, Rebecka Apley, the attending physician, personally viewed and interpreted this ECG.   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 ____________________________________________   PROCEDURES  Procedure(s) performed: None  Critical Care performed: No  ____________________________________________   INITIAL IMPRESSION / ASSESSMENT AND PLAN / ED COURSE  Pertinent labs & imaging results that were available during my care of the patient were reviewed by me and considered in my medical decision making (see chart for details).  This is a 55 year old female who has a history of diabetes and stroke who comes into the hospital today unresponsive. The patient's husband her blood sugar was very low at home. He did give her some Sprite and it did come up to the 50s. As the patient arrived here her blood sugar is still in the 50s but she is not responsive. She is also shaking which is concerning for possible seizure activity. I will give the patient a half an amp of D50 as well as a milligram of Ativan. I will check some blood work and reassess the patient.  After the D50 the patient's blood sugar came  up and she was more responsive. We did have her take some oral intake and blood sugar continued to be appropriate. The patient though does have some abnormal liver enzymes. I will check a lipase and send the patient for an ultrasound of her gallbladder.  The patient's care will be signed out to Dr Inocencio Homes who will follow up with the [place] and reassess the patient.  ____________________________________________   FINAL CLINICAL IMPRESSION(S) / ED DIAGNOSES  Final diagnoses:  Elevated liver enzymes  Hypoglycemia  Unresponsive episode      Rebecka Apley, MD 12/13/15 [id number]

## 2015-12-15 LAB — URINE CULTURE

## 2015-12-16 NOTE — Progress Notes (Signed)
ED Culture Report Follow up by Pharmacy  UC[facility] Culture from 12/13/15 growing >100k E Coli. Pt d/c'd on levofloxacin. Called microlab, who confirmed that the E Coli growing is resistant to levofloxacin. Called pt at [phone]. No answer, left message to call us back. Spoke with Dr. Lenard LancePaduchowski who agreed with changing the antibiotic to keflex 500 mg PO BID x10 days. Will await pt call back.

## 2015-12-16 NOTE — Progress Notes (Signed)
ED Culture Report Follow up by Pharmacy  UCx Culture from 12/13/15 growing >100k E Coli. Pt d/c'd on levofloxacin. Called microlab, who confirmed that the E Coli growing is resistant to levofloxacin. Called pt at [phone]. No answer, left message to call us back. Spoke with Dr. Lenard LancePaduchowski who agreed with changing the antibiotic to keflex 500 mg PO BID x10 days. Will await pt call back.   Called pt again at 1930 with no answer. Left another message on pt's phone to call. (see above)

## 2016-01-26 ENCOUNTER — Emergency Department: Payer: BLUE CROSS/BLUE SHIELD

## 2016-01-26 ENCOUNTER — Inpatient Hospital Stay
Admission: EM | Admit: 2016-01-26 | Discharge: 2016-01-28 | DRG: 639 | Disposition: A | Discharge status: Home / Self Care | Payer: BLUE CROSS/BLUE SHIELD | Attending: Internal Medicine | Admitting: Internal Medicine

## 2016-01-26 ENCOUNTER — Encounter: Payer: Self-pay | Admitting: Emergency Medicine

## 2016-01-26 DIAGNOSIS — I959 Hypotension, unspecified: Secondary | ICD-10-CM | POA: Diagnosis present

## 2016-01-26 DIAGNOSIS — Z880 Allergy status to penicillin: Secondary | ICD-10-CM

## 2016-01-26 DIAGNOSIS — Z7982 Long term (current) use of aspirin: Secondary | ICD-10-CM | POA: Diagnosis not present

## 2016-01-26 DIAGNOSIS — H540 Blindness, both eyes: Secondary | ICD-10-CM | POA: Diagnosis present

## 2016-01-26 DIAGNOSIS — R4182 Altered mental status, unspecified: Secondary | ICD-10-CM | POA: Diagnosis not present

## 2016-01-26 DIAGNOSIS — R351 Nocturia: Secondary | ICD-10-CM | POA: Diagnosis present

## 2016-01-26 DIAGNOSIS — I10 Essential (primary) hypertension: Secondary | ICD-10-CM | POA: Diagnosis present

## 2016-01-26 DIAGNOSIS — Z881 Allergy status to other antibiotic agents status: Secondary | ICD-10-CM

## 2016-01-26 DIAGNOSIS — F0631 Mood disorder due to known physiological condition with depressive features: Secondary | ICD-10-CM | POA: Diagnosis not present

## 2016-01-26 DIAGNOSIS — Z885 Allergy status to narcotic agent status: Secondary | ICD-10-CM | POA: Diagnosis not present

## 2016-01-26 DIAGNOSIS — R680 Hypothermia, not associated with low environmental temperature: Secondary | ICD-10-CM | POA: Diagnosis present

## 2016-01-26 DIAGNOSIS — Z8673 Personal history of transient ischemic attack (TIA), and cerebral infarction without residual deficits: Secondary | ICD-10-CM

## 2016-01-26 DIAGNOSIS — I639 Cerebral infarction, unspecified: Secondary | ICD-10-CM | POA: Diagnosis not present

## 2016-01-26 DIAGNOSIS — N3000 Acute cystitis without hematuria: Secondary | ICD-10-CM

## 2016-01-26 DIAGNOSIS — Z888 Allergy status to other drugs, medicaments and biological substances status: Secondary | ICD-10-CM

## 2016-01-26 DIAGNOSIS — T68XXXA Hypothermia, initial encounter: Secondary | ICD-10-CM

## 2016-01-26 DIAGNOSIS — F329 Major depressive disorder, single episode, unspecified: Secondary | ICD-10-CM | POA: Diagnosis present

## 2016-01-26 DIAGNOSIS — E11649 Type 2 diabetes mellitus with hypoglycemia without coma: Secondary | ICD-10-CM | POA: Diagnosis present

## 2016-01-26 DIAGNOSIS — IMO0002 Reserved for concepts with insufficient information to code with codable children: Secondary | ICD-10-CM

## 2016-01-26 DIAGNOSIS — E162 Hypoglycemia, unspecified: Secondary | ICD-10-CM | POA: Diagnosis present

## 2016-01-26 HISTORY — DX: Unqualified visual loss, both eyes: H54.3

## 2016-01-26 LAB — COMPREHENSIVE METABOLIC PANEL
ALK PHOS: 130 U/L — AB (ref 38–126)
ALT: 244 U/L — AB (ref 14–54)
AST: 254 U/L — ABNORMAL HIGH (ref 15–41)
Albumin: 3.4 g/dL — ABNORMAL LOW (ref 3.5–5.0)
Anion gap: 6 (ref 5–15)
BILIRUBIN TOTAL: 0.7 mg/dL (ref 0.3–1.2)
BUN: 28 mg/dL — ABNORMAL HIGH (ref 6–20)
CALCIUM: 8.5 mg/dL — AB (ref 8.9–10.3)
CO2: 26 mmol/L (ref 22–32)
CREATININE: 1.08 mg/dL — AB (ref 0.44–1.00)
Chloride: 108 mmol/L (ref 101–111)
GFR, EST NON AFRICAN AMERICAN: 56 mL/min — AB (ref >60)
Glucose, Bld: 117 mg/dL — ABNORMAL HIGH (ref 65–99)
Potassium: 3.8 mmol/L (ref 3.5–5.1)
SODIUM: 140 mmol/L (ref 135–145)
TOTAL PROTEIN: 8.1 g/dL (ref 6.5–8.1)

## 2016-01-26 LAB — CBC
HEMATOCRIT: 42 % (ref 35.0–47.0)
HEMOGLOBIN: 13.9 g/dL (ref 12.0–16.0)
MCH: 28.8 pg (ref 26.0–34.0)
MCHC: 33.2 g/dL (ref 32.0–36.0)
MCV: 86.7 fL (ref 80.0–100.0)
Platelets: 239 10*3/uL (ref 150–440)
RBC: 4.84 MIL/uL (ref 3.80–5.20)
RDW: 16.2 % — AB (ref 11.5–14.5)
WBC: 7.6 10*3/uL (ref 3.6–11.0)

## 2016-01-26 LAB — URINALYSIS COMPLETE WITH MICROSCOPIC (ARMC ONLY)
Bilirubin Urine: NEGATIVE
Glucose, UA: NEGATIVE mg/dL
Ketones, ur: NEGATIVE mg/dL
NITRITE: NEGATIVE
PROTEIN: 100 mg/dL — AB
SPECIFIC GRAVITY, URINE: 1.012 (ref 1.005–1.030)
pH: 5 (ref 5.0–8.0)

## 2016-01-26 LAB — URINE DRUG SCREEN, QUALITATIVE (ARMC ONLY)
Amphetamines, Ur Screen: NOT DETECTED
BARBITURATES, UR SCREEN: NOT DETECTED
BENZODIAZEPINE, UR SCRN: NOT DETECTED
CANNABINOID 50 NG, UR ~~LOC~~: NOT DETECTED
Cocaine Metabolite,Ur ~~LOC~~: NOT DETECTED
MDMA (Ecstasy)Ur Screen: NOT DETECTED
METHADONE SCREEN, URINE: NOT DETECTED
Opiate, Ur Screen: NOT DETECTED
Phencyclidine (PCP) Ur S: NOT DETECTED
TRICYCLIC, UR SCREEN: NOT DETECTED

## 2016-01-26 LAB — TROPONIN I

## 2016-01-26 LAB — SALICYLATE LEVEL

## 2016-01-26 LAB — GLUCOSE, CAPILLARY
GLUCOSE-CAPILLARY: 144 mg/dL — AB (ref 65–99)
Glucose-Capillary: 121 mg/dL — ABNORMAL HIGH (ref 65–99)
Glucose-Capillary: 73 mg/dL (ref 65–99)
Glucose-Capillary: 209 mg/dL — ABNORMAL HIGH (ref 65–99)
Glucose-Capillary: 267 mg/dL — ABNORMAL HIGH (ref 65–99)

## 2016-01-26 LAB — ACETAMINOPHEN LEVEL: Acetaminophen (Tylenol), Serum: <10 ug/mL — ABNORMAL LOW (ref 10–30)

## 2016-01-26 LAB — ETHANOL

## 2016-01-26 LAB — LACTIC ACID, PLASMA
Lactic Acid, Venous: 1 mmol/L (ref 0.5–2.0)
Lactic Acid, Venous: 1.6 mmol/L (ref 0.5–2.0)

## 2016-01-26 MED ORDER — ACETAMINOPHEN 650 MG RE SUPP: 650.0000 mg | Freq: Four times a day (QID) | RECTAL | Status: DC | PRN

## 2016-01-26 MED ORDER — DEXTROSE-NACL 5-0.45 % IV SOLN
INTRAVENOUS | Status: DC
  Administered 2016-01-26: 15:00:00 via INTRAVENOUS

## 2016-01-26 MED ORDER — ONDANSETRON HCL 4 MG PO TABS: 4.0000 mg | ORAL_TABLET | Freq: Four times a day (QID) | ORAL | Status: DC | PRN

## 2016-01-26 MED ORDER — SODIUM CHLORIDE 0.9 % IV BOLUS (SEPSIS)
2000.0000 mL | Freq: Once | INTRAVENOUS | Status: AC
  Administered 2016-01-26: 2000 mL via INTRAVENOUS

## 2016-01-26 MED ORDER — ACETAMINOPHEN 325 MG PO TABS: 650.0000 mg | ORAL_TABLET | Freq: Four times a day (QID) | ORAL | Status: DC | PRN

## 2016-01-26 MED ORDER — ASPIRIN 81 MG PO CHEW
81.0000 mg | CHEWABLE_TABLET | Freq: Every day | ORAL | Status: DC
  Administered 2016-01-26 – 2016-01-28 (×3): 81 mg via ORAL
  Filled 2016-01-26 (×3): qty 1

## 2016-01-26 MED ORDER — ATORVASTATIN CALCIUM 20 MG PO TABS
40.0000 mg | ORAL_TABLET | Freq: Every day | ORAL | Status: DC
  Administered 2016-01-26 – 2016-01-28 (×3): 40 mg via ORAL
  Filled 2016-01-26 (×3): qty 2

## 2016-01-26 MED ORDER — ENOXAPARIN SODIUM 40 MG/0.4ML ~~LOC~~ SOLN
40.0000 mg | SUBCUTANEOUS | Status: DC
  Administered 2016-01-27: 40 mg via SUBCUTANEOUS
  Filled 2016-01-26: qty 0.4

## 2016-01-26 MED ORDER — DEXTROSE 5 % IV SOLN
1.0000 g | Freq: Once | INTRAVENOUS | Status: AC
  Administered 2016-01-26: 1 g via INTRAVENOUS
  Filled 2016-01-26 (×2): qty 10

## 2016-01-26 MED ORDER — DEXTROSE 5 % IV SOLN
1.0000 g | INTRAVENOUS | Status: DC
  Administered 2016-01-27: 1 g via INTRAVENOUS
  Filled 2016-01-26 (×2): qty 10

## 2016-01-26 MED ORDER — ONDANSETRON HCL 4 MG/2ML IJ SOLN: 4.0000 mg | Freq: Four times a day (QID) | INTRAMUSCULAR | Status: DC | PRN

## 2016-01-26 NOTE — ED Notes (Signed)
Pt responding to painful and verbal stimuli, denies pain, oriented and alert.

## 2016-01-26 NOTE — ED Provider Notes (Signed)
[facility] Emergency Department Provider Note  ____________________________________________  Time seen: 8:20 AM on arrival by EMS  I have reviewed the triage vital signs and the nursing notes.   HISTORY  Chief Complaint No chief complaint on file. Unresponsive  Level 5 caveat:  Portions of the history and physical were unable to be obtained due to the patient's acute illness and depressed level of consciousness  HPI [name] Gilbert is a 55 y.o. female found unresponsive on the floor at home this morning by her daughter. Did not eat dinner last night due to not feeling well. Daughter thinks she may have taken extra insulin as well this morning. Patient is insulin-dependent diabetic, allergic to metformin. Also has history of hypertension hyperlipidemia and stroke. Last known well time was bedtime last night.  EMS gave one amp D50 due to hypoglycemia 30. Initial blood sugar responded to about 190, on arrival its decreased again to 74.  Per daughter, patient has had no appetite over last few days and not eating but still taking her insulin.     Past Medical History  Diagnosis Date  . Diabetes mellitus without complication (HCC)   . Hypertension   . Stroke (HCC)   . Blind in both eyes      There are no active problems to display for this patient.    History reviewed. No pertinent past surgical history.   Current Outpatient Rx  Name  Route  Sig  Dispense  Refill  . amLODipine (NORVASC) 10 MG tablet   Oral   Take 10 mg by mouth daily.         Marland Kitchen aspirin 81 MG chewable tablet   Oral   Chew 81 mg by mouth daily.         . carvedilol (COREG) 25 MG tablet   Oral   Take 25 mg by mouth 2 (two) times daily.         Marland Kitchen atorvastatin (LIPITOR) 40 MG tablet   Oral   Take 40 mg by mouth daily.         . insulin NPH-regular Human (NOVOLIN 70/30) (70-30) 100 UNIT/ML injection   Subcutaneous   Inject 10-15 Units into the skin 2 (two) times  daily with a meal. 10 units in the morning and 15 units in the evening.         Marland Kitchen levofloxacin (LEVAQUIN) 750 MG tablet   Oral   Take 1 tablet (750 mg total) by mouth daily.   5 tablet   0   . lisinopril-hydrochlorothiazide (PRINZIDE,ZESTORETIC) 20-12.5 MG per tablet   Oral   Take 2 tablets by mouth daily.            Allergies Ampicillin; Penicillins; Amoxicillin; Codeine; Metformin; and Statins   History reviewed. No pertinent family history.  Social History Social History  Substance Use Topics  . Smoking status: Never Smoker   . Smokeless tobacco: None  . Alcohol Use: No    Review of Systems Initially unable to provide, but later obtained when patient was more awake Constitutional:   No fever or chills. Generalized weakness, loss of appetite Eyes:   No vision changes.  ENT:   No sore throat. No rhinorrhea. Cardiovascular:   No chest pain. Respiratory:   No dyspnea or cough. Gastrointestinal:   Negative for abdominal pain, vomiting and diarrhea.  Genitourinary:   Negative for dysuria or difficulty urinating. Musculoskeletal:   Negative for focal pain or swelling Neurological:   Negative for headaches 10-point  ROS otherwise negative.  ____________________________________________   PHYSICAL EXAM:  VITAL SIGNS: ED Triage Vitals  Enc Vitals Group     BP 01/26/16 0818 185/100 mmHg     Pulse Rate 01/26/16 0818 67     Resp --      Temp --      Temp src --      SpO2 01/26/16 0818 100 %     Weight --      Height --      Head Cir --      Peak Flow --      Pain Score --      Pain Loc --      Pain Edu? --      Excl. in GC? --     Vital signs reviewed, nursing assessments reviewed.   Constitutional:   Awake but confused, not in distress Eyes:   No scleral icterus. No conjunctival pallor. PERRL. EOMI.  No nystagmus. No gaze fixation ENT   Head:   Normocephalic and atraumatic.   Nose:   No congestion/rhinnorhea. No septal hematoma    Mouth/Throat:   Dry mucous membranes, no pharyngeal erythema. No peritonsillar mass.    Neck:   No stridor. No SubQ emphysema. No meningismus. Hematological/Lymphatic/Immunilogical:   No cervical lymphadenopathy. Cardiovascular:   RRR. Symmetric bilateral radial and DP pulses.  No murmurs.  Respiratory:   Normal respiratory effort without tachypnea nor retractions. Breath sounds are clear and equal bilaterally. No wheezes/rales/rhonchi. Gastrointestinal:   Soft and nontender. Non distended. There is no CVA tenderness.  No rebound, rigidity, or guarding. Genitourinary:   deferred Musculoskeletal:   Nontender with normal range of motion in all extremities. No joint effusions.  No lower extremity tenderness.  No edema. Neurologic:   Slowly responsive, cranial nerves intact, motor intact. Exam nonfocal Skin:    Skin is warm, dry and intact. No rash noted.  No petechiae, purpura, or bullae.  ____________________________________________    LABS (pertinent positives/negatives) (all labs ordered are listed, but only abnormal results are displayed) Labs Reviewed  GLUCOSE, CAPILLARY - Abnormal; Notable for the following:    Glucose-Capillary 144 (*)    All other components within normal limits  CBC - Abnormal; Notable for the following:    RDW 16.2 (*)    All other components within normal limits  URINALYSIS COMPLETEWITH MICROSCOPIC ([facility] ONLY) - Abnormal; Notable for the following:    Color, Urine YELLOW (*)    APPearance CLOUDY (*)    Hgb urine dipstick 1+ (*)    Protein, ur 100 (*)    Leukocytes, UA TRACE (*)    Bacteria, UA MANY (*)    Squamous Epithelial / LPF 0-5 (*)    All other components within normal limits  BLOOD GAS, VENOUS - Abnormal; Notable for the following:    pO2, Ven <31.0 (*)    Bicarbonate 28.5 (*)    All other components within normal limits  GLUCOSE, CAPILLARY - Abnormal; Notable for the following:    Glucose-Capillary 121 (*)    All other components within  normal limits  COMPREHENSIVE METABOLIC PANEL - Abnormal; Notable for the following:    Glucose, Bld 117 (*)    BUN 28 (*)    Creatinine, Ser 1.08 (*)    Calcium 8.5 (*)    Albumin 3.4 (*)    AST 254 (*)    ALT 244 (*)    Alkaline Phosphatase 130 (*)    GFR calc non Af Amer 56 (*)  All other components within normal limits  ACETAMINOPHEN LEVEL - Abnormal; Notable for the following:    Acetaminophen (Tylenol), Serum <10 (*)    All other components within normal limits  CULTURE, BLOOD (ROUTINE X 2)  CULTURE, BLOOD (ROUTINE X 2)  URINE CULTURE  URINE DRUG SCREEN, QUALITATIVE ([facility] ONLY)  LACTIC ACID, PLASMA  TROPONIN I  COMPREHENSIVE METABOLIC PANEL  ETHANOL  SALICYLATE LEVEL  ACETAMINOPHEN LEVEL  TROPONIN I  LACTIC ACID, PLASMA  CBG MONITORING, ED  CBG MONITORING, ED   ____________________________________________   EKG  Interpreted by me Normal sinus rhythm rate of 83, right axis deviation, normal QRS ST segments and T waves  ____________________________________________    RADIOLOGY  CT head unremarkable Chest x-ray unremarkable  ____________________________________________   PROCEDURES CRITICAL CARE Performed by: Scotty Court, Riku Buttery   Total critical care time: 35 minutes  Critical care time was exclusive of separately billable procedures and treating other patients.  Critical care was necessary to treat or prevent imminent or life-threatening deterioration.  Critical care was time spent personally by me on the following activities: development of treatment plan with patient and/or surrogate as well as nursing, discussions with consultants, evaluation of patient's response to treatment, examination of patient, obtaining history from patient or surrogate, ordering and performing treatments and interventions, ordering and review of laboratory studies, ordering and review of radiographic studies, pulse oximetry and re-evaluation of patient's  condition.   ____________________________________________   INITIAL IMPRESSION / ASSESSMENT AND PLAN / ED COURSE  Pertinent labs & imaging results that were available during my care of the patient were reviewed by me and considered in my medical decision making (see chart for details).  Patient presents with hypothermia, hypoglycemia. On arrival to the emergency department blood sugar was down to 60 again, so patient was given another infusion of IV D50 by EMS. Found to be hypothermic at 93, passive rewarming blankets initiated. Exam nonfocal, labs CT head and x-ray all performed. Over the ensuing 4 hours in the emergency department, body temperature slowly improved to approximately normal. Other vital signs remained stable. Blood pressure stable and unremarkable. Workup is significant for urinary tract infection. Likely this is caused her malaise and loss of appetite which is then produced hypothermia and hypoglycemia in the setting of her insulin use. Discussed the case with the hospitalist for further management.     ____________________________________________   FINAL CLINICAL IMPRESSION(S) / ED DIAGNOSES  Final diagnoses:  Acute cystitis without hematuria  Hypothermia, initial encounter  Hypoglycemia       Portions of this note were generated with dragon dictation software. Dictation errors may occur despite best attempts at proofreading.   Sharman Cheek, MD 01/26/16 1254

## 2016-01-26 NOTE — ED Notes (Signed)
Per [facility]: pt. Found unresponsive in the floor at home by daughter who called. Pt. Initial CBG 30, amp D50 given. Pt. Is blind bilaterally. PT. Not oriented, but responsive to questions.   Pt. Arrives via [facility] hypoglycemic. Upon arrival, CBG 74.

## 2016-01-26 NOTE — ED Notes (Signed)
Blanket warmer applied per order.

## 2016-01-26 NOTE — ED Notes (Signed)
Pt responding verbally to pain caused by IV sticks, also states "i am very cold."

## 2016-01-26 NOTE — ED Notes (Signed)
MD at bedside. 

## 2016-01-26 NOTE — ED Notes (Signed)
Rectal temp placed per order. Pt tolerating well.

## 2016-01-26 NOTE — H&P (Signed)
[facility] [place] at [facility]   PATIENT NAME: [name] Gilbert    MR#:  [id number]  DATE OF BIRTH:  [birth year]  DATE OF ADMISSION:  01/26/2016  PRIMARY CARE PHYSICIAN: [facility]   REQUESTING/REFERRING PHYSICIAN: Dr. Scotty Court  CHIEF COMPLAINT:   Found unresponsive at home.  HISTORY OF PRESENT ILLNESS:  [name] Gilbert  is a 55 y.o. female with a known history of To 2 diabetes on insulin, hypertension comes to the emergency room after she was found unresponsive on the floor this morning by her daughter. She did not eat dinner last night due to nocturia and well. The patient continues to take her insulin. She was found to have blood sugar of 30. EMS gave an amp of D50. She responded blood sugar went up to 190 and decreased again to 74. She got another dose of D50 now on D5 normal saline.  Patient was also found hypothermic with temperature of 93. She is on a bear hugger and appears to be well oriented 3. She is being admitted with hypoglycemia hypothermia and UTI  PAST MEDICAL HISTORY:   Past Medical History  Diagnosis Date  . Diabetes mellitus without complication (HCC)   . Hypertension   . Stroke (HCC)   . Blind in both eyes     PAST SURGICAL HISTOIRY:  History reviewed. No pertinent past surgical history.  SOCIAL HISTORY:   Social History  Substance Use Topics  . Smoking status: Never Smoker   . Smokeless tobacco: Not on file  . Alcohol Use: No    FAMILY HISTORY:  Diabetes  DRUG ALLERGIES:   Allergies  Allergen Reactions  . Ampicillin Hives  . Penicillins Hives  . Amoxicillin   . Codeine   . Metformin   . 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 Negative for fever, chills and weight loss.  HENT: Negative for ear discharge, ear pain and nosebleeds.   Eyes: Negative for blurred vision, pain and discharge.  Respiratory: Negative  for sputum production, shortness of breath, wheezing and stridor.   Cardiovascular: Negative for chest pain, palpitations, orthopnea and PND.  Gastrointestinal: Negative for nausea, vomiting, abdominal pain and diarrhea.  Genitourinary: Negative for urgency and frequency.  Musculoskeletal: Negative for back pain and joint pain.  Neurological: Positive for weakness. Negative for sensory change, speech change and focal weakness.  Psychiatric/Behavioral: Negative for depression and hallucinations. The patient is not nervous/anxious.   All other systems reviewed and are negative.    MEDICATIONS AT HOME:   Prior to Admission medications   Medication Sig Start Date End Date Taking? Authorizing Provider  amLODipine (NORVASC) 10 MG tablet Take 10 mg by mouth daily.   Yes Historical Provider, MD  aspirin 81 MG chewable tablet Chew 81 mg by mouth daily.   Yes Historical Provider, MD  atorvastatin (LIPITOR) 40 MG tablet Take 40 mg by mouth daily.   Yes Historical Provider, MD  carvedilol (COREG) 25 MG tablet Take 25 mg by mouth 2 (two) times daily.   Yes Historical Provider, MD  insulin NPH-regular Human (NOVOLIN 70/30) (70-30) 100 UNIT/ML injection Inject 8-20 Units into the skin 2 (two) times daily with a meal. 20 units in the morning and 8 units in the evening.   Yes Historical Provider, MD  lisinopril-hydrochlorothiazide (PRINZIDE,ZESTORETIC) 20-12.5 MG per tablet Take 2 tablets by mouth daily.   Yes Historical Provider, MD  levofloxacin (LEVAQUIN) 750 MG tablet Take 1 tablet (750 mg total) by mouth daily. 12/13/15   Gayla Doss, MD      VITAL SIGNS:  Blood pressure 119/66, pulse 100, temperature 98.7 F (37.1 C), temperature source Oral, resp. rate 11, SpO2 91 %.  PHYSICAL EXAMINATION:  GENERAL:  55 y.o.-year-old patient lying in the bed with no acute distress.  EYES: Pupils equal, round, reactive to light and accommodation. No scleral icterus. Extraocular muscles intact.  HEENT: Head  atraumatic, normocephalic. Oropharynx and nasopharynx clear.  NECK:  Supple, no jugular venous distention. No thyroid enlargement, no tenderness.  LUNGS: Normal breath sounds bilaterally, no wheezing, rales,rhonchi or crepitation. No use of accessory muscles of respiration.  CARDIOVASCULAR: S1, S2 normal. No murmurs, rubs, or gallops.  ABDOMEN: Soft, nontender, nondistended. Bowel sounds present. No organomegaly or mass.  EXTREMITIES: No pedal edema, cyanosis, or clubbing.  NEUROLOGIC: Cranial nerves II through XII are intact. Muscle strength 5/5 in all extremities. Sensation intact. Gait not checked.  PSYCHIATRIC: The patient is alert and oriented x 3.  SKIN: No obvious rash, lesion, or ulcer.   LABORATORY PANEL:   CBC  Recent Labs Lab 01/26/16 0811  WBC 7.6  HGB 13.9  HCT 42.0  PLT 239   ------------------------------------------------------------------------------------------------------------------  Chemistries   Recent Labs Lab 01/26/16 1030  NA 140  K 3.8  CL 108  CO2 26  GLUCOSE 117*  BUN 28*  CREATININE 1.08*  CALCIUM 8.5*  AST 254*  ALT 244*  ALKPHOS 130*  BILITOT 0.7   ------------------------------------------------------------------------------------------------------------------  Cardiac Enzymes  Recent Labs Lab 01/26/16 1030  TROPONINI <0.03   ------------------------------------------------------------------------------------------------------------------  RADIOLOGY:  Ct Head Wo Contrast  01/26/2016  CLINICAL DATA:  Found unresponsive EXAM: CT HEAD WITHOUT CONTRAST TECHNIQUE: Contiguous axial images were obtained from the base of the skull through the vertex without intravenous contrast. COMPARISON:  12/13/2015 FINDINGS: The bony calvarium is intact. 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 No findings to suggest acute hemorrhage, acute infarction or space-occupying mass lesion are noted. IMPRESSION: Chronic  changes without acute abnormality. Electronically Signed   By: Alcide Clever M.D.   On: 01/26/2016 09:19   Dg Chest Portable 1 View  01/26/2016  CLINICAL DATA:  Altered mental status.  Found unresponsive. EXAM: PORTABLE CHEST 1 VIEW COMPARISON:  12/06/2014 FINDINGS: The cardiomediastinal silhouette is within normal limits. The lungs are well inflated and clear. There is no evidence of pleural effusion or pneumothorax. No acute osseous abnormality is identified. IMPRESSION: No active disease. Electronically Signed   By: Sebastian Ache M.D.   On: 01/26/2016 09:10    EKG:  NSR  IMPRESSION AND PLAN:   Yarrow Linhart  is a 55 y.o. female with a known history of To 2 diabetes on insulin, hypertension comes to the emergency room after she was found unresponsive on the floor this morning by her daughter. She did not eat dinner last night due to nocturia and well. The patient continues to take her insulin. She was found to have blood sugar of 30. EMS gave an amp of D50  1. Acute altered mental status/unresponsiveness secondary to Severe hypoglycemia secondary to poor by mouth intake and continuing use of her insulin -Continue dextrose with normal saline drip. -Accu-Chek every 4 hour once sugar stable. And resume sliding scale insulin -Patient encouraged to eat. 5.  2. Hypothermia suspected due to #1 and UTI -Patient's temperature appears normal. She was kept on a bear hugger. Received IV fluids.  3. UTI continue IV Rocephin and follow blood cultures and urine cultures  4. Hypertension Patient currently has relative hypotension. I will hold off on BP meds. Resume once blood pressure stable  5. DVT prophylaxis subcutaneous Lovenox  No family present in the emergency room  All the records are reviewed and case discussed with ED provider. Management plans discussed with the patient and they are in agreement.  CODE STATUS:Full  TOTAL TIME TAKING CARE OF THIS PATIENT: 50  minutes.    Lashaunda Schild  M.D on 01/26/2016 at 2:17 PM  Between 7am to 6pm - Pager - [phone]  After 6pm go to [email or web address] - password [email or web address] [facility]  [facility]  Office  [phone]  CC: Primary care physician; No PCP Per Patient

## 2016-01-27 DIAGNOSIS — IMO0002 Reserved for concepts with insufficient information to code with codable children: Secondary | ICD-10-CM

## 2016-01-27 DIAGNOSIS — F0631 Mood disorder due to known physiological condition with depressive features: Secondary | ICD-10-CM

## 2016-01-27 DIAGNOSIS — I639 Cerebral infarction, unspecified: Secondary | ICD-10-CM

## 2016-01-27 LAB — GLUCOSE, CAPILLARY
GLUCOSE-CAPILLARY: 300 mg/dL — AB (ref 65–99)
GLUCOSE-CAPILLARY: 258 mg/dL — AB (ref 65–99)
GLUCOSE-CAPILLARY: 181 mg/dL — AB (ref 65–99)
Glucose-Capillary: 237 mg/dL — ABNORMAL HIGH (ref 65–99)
Glucose-Capillary: 251 mg/dL — ABNORMAL HIGH (ref 65–99)
Glucose-Capillary: 267 mg/dL — ABNORMAL HIGH (ref 65–99)
Glucose-Capillary: 281 mg/dL — ABNORMAL HIGH (ref 65–99)
Glucose-Capillary: 295 mg/dL — ABNORMAL HIGH (ref 65–99)

## 2016-01-27 MED ORDER — CARVEDILOL 25 MG PO TABS
25.0000 mg | ORAL_TABLET | Freq: Every day | ORAL | Status: DC
  Administered 2016-01-27 – 2016-01-28 (×2): 25 mg via ORAL
  Filled 2016-01-27 (×2): qty 1

## 2016-01-27 MED ORDER — DEXTROSE 5 % IV SOLN
1.0000 g | INTRAVENOUS | Status: DC
  Administered 2016-01-28: 1 g via INTRAVENOUS
  Filled 2016-01-27: qty 10

## 2016-01-27 MED ORDER — INSULIN ASPART 100 UNIT/ML ~~LOC~~ SOLN
0.0000 [IU] | Freq: Every day | SUBCUTANEOUS | Status: DC
Start: 2016-01-27 — End: 2016-01-28

## 2016-01-27 MED ORDER — AMLODIPINE BESYLATE 10 MG PO TABS
10.0000 mg | ORAL_TABLET | Freq: Every day | ORAL | Status: DC
  Administered 2016-01-27 – 2016-01-28 (×2): 10 mg via ORAL
  Filled 2016-01-27 (×2): qty 1

## 2016-01-27 MED ORDER — MIRTAZAPINE 15 MG PO TBDP
15.0000 mg | ORAL_TABLET | Freq: Every day | ORAL | Status: DC
  Filled 2016-01-27: qty 1

## 2016-01-27 MED ORDER — HYDROCHLOROTHIAZIDE 25 MG PO TABS
25.0000 mg | ORAL_TABLET | Freq: Every day | ORAL | Status: DC
  Administered 2016-01-27 – 2016-01-28 (×2): 25 mg via ORAL
  Filled 2016-01-27 (×2): qty 1

## 2016-01-27 MED ORDER — INSULIN ASPART 100 UNIT/ML ~~LOC~~ SOLN
0.0000 [IU] | Freq: Three times a day (TID) | SUBCUTANEOUS | Status: DC
  Administered 2016-01-27 (×2): 5 [IU] via SUBCUTANEOUS
  Administered 2016-01-28 (×2): 3 [IU] via SUBCUTANEOUS
  Filled 2016-01-27 (×2): qty 5
  Filled 2016-01-27 (×2): qty 3

## 2016-01-27 MED ORDER — LISINOPRIL 20 MG PO TABS
40.0000 mg | ORAL_TABLET | Freq: Every day | ORAL | Status: DC
  Administered 2016-01-27 – 2016-01-28 (×2): 40 mg via ORAL
  Filled 2016-01-27 (×2): qty 2

## 2016-01-27 NOTE — Progress Notes (Signed)
Daughter informed me that she was concerned for mother's visual sense.  She states that the mother told her "I like your new tennis shoes" however, the daughter was not wearing sneakers but wearing white flip-flops.    She also felt as though her mother's cognition has declined as the daughter informed her mother that she was going to heat up her dinner in the microwave downstairs but her mothers reply was completely unattached to the conversation of food.  Dr. Elisabeth PigeonVachhani paged and awaiting on return call.

## 2016-01-27 NOTE — Progress Notes (Signed)
Pt Automatic BP was 172/80.  The BP was re-checked manually approx 30 minutes later and read to be 162/98.  Pt was asymptomatic however, given her hx of stroke, I informed Dr. Elisabeth PigeonVachhani that according to the [facility], that the pt was not taking her usual home medications for HTN.  Dr. Elisabeth PigeonVachhani ordered for pt's PTA Anti-Hypertensive medications to be given.  Will administer medications and continue to monitor patient and notify MD if needed.  .Marland Kitchen

## 2016-01-27 NOTE — Progress Notes (Signed)
Chaplain visited the patient on multiple occassions. Chaplain met the husband of the patient on the first encounter. The patient was in the shower room on multiple occassions, but Chaplain suspects the patient is not interested in a visit (due to the patient seemingly making herself 'unavailable' more than once). However, the Chaplain believes after being briefed by the nurse and hearing the encounters between the Doctor and patient, that a clinical assessment (emotional, mental and spiritual state) would be beneficial and seems a necessity. Patient may be depressed as she has been laid off for some time and unable to work in a setting that gives her fulfillment. Chaplain will again continue to seek out patient in an effort to support her spiritually, psychologically and emotionally during this challenging season.  

## 2016-01-27 NOTE — Progress Notes (Signed)
Pt refused to take insulin and eat breakfast. Pt stated that she, "Ate an orange and some carrots yesterday." Pt was encouraged to eat so MD's would see that she can keep her blood sugar up by PO intake. Pt's daughter was observed crying in the room saying, "You always do this to me every time I come home, you don't eat." RN went into room at 0845 and encouraged the pt to call and place a breakfast order. Pt stated she would do that and try to eat.

## 2016-01-27 NOTE — Progress Notes (Signed)
Pt refused breakfast tray. Still not willing to eat, despite education about blood sugars and how they will continue to drop.

## 2016-01-27 NOTE — Progress Notes (Addendum)
Inpatient Diabetes Program Recommendations  AACE/ADA: New Consensus Statement on Inpatient Glycemic Control (2015)  Target Ranges:  Prepandial:   less than 140 mg/dL      Peak postprandial:   less than 180 mg/dL (1-2 hours)      Critically ill patients:  140 - 180 mg/dL  Results for [name] Gilbert, Loris (MRN [id number]) as of 01/27/2016 12:09  Ref. Range 01/26/2016 16:47 01/26/2016 20:35 01/27/2016 00:28 01/27/2016 04:04 01/27/2016 07:43 01/27/2016 11:29  Glucose-Capillary Latest Ref Range: 65-99 mg/dL 841209 (H) 324267 (H) 401295 (H) 267 (H) 237 (H) 281 (H)   Review of Glycemic Control  Diabetes history: DM2 Outpatient Diabetes medications: 70/30 20 units QAM, 70/30 8 units QPM Current orders for Inpatient glycemic control: Novolog 0-9 units TID with meals, Novolog 0-5 units QHS  Inpatient Diabetes Program Recommendations: Insulin - Basal: Noted patient takes 70/30 as an outpatient and experiened hypoglcyemia after taking insulin and not eating. Hypoglycemia has resolved and glucose has ranged from 209-295 mg/dl over the past 20 hours and patient has NOT received any insulin since being admitted. If patient does not eat well, may want to switch basal insulin to Lantus since 70/30 has intermediate and short acting insulin mixed and is not recommended if patient is not eating well. Recommend starting with Lantus 9 units Q24H (based on 87 kg x 0.1 untis) starting now. Correction (SSI): May want to consider changing CBGs and Novolog frequency to Q4H since patient continues to have poor PO intake. NURSING: Please administer Novolog correction insulin as ordered even if patient is not eating. Correction insulin is intended to get glucose back down into target ranges of 140-180 mg/dl and should not be held without a MD order to hold.  A1C: Please consider ordering an A1C to evaluate glycemic control over the past 2-3 months.  Thanks, Orlando PennerMarie Zelma Snead, RN, MSN, CDE Diabetes Coordinator Inpatient Diabetes  Program [phone] (Team Pager from 8am to 5pm) [phone] ([facility] office) [phone] [facility] office) [phone] [facility] office)

## 2016-01-27 NOTE — Consult Note (Signed)
[facility] Face-to-Face Psychiatry Consult   Reason for Consult:  Consult requested for this 55 year old woman because of concern about depression. Referring Physician:  Anselm Jungling Patient Identification: [name] Gilbert MRN:  [id number] Principal Diagnosis: Depression due to stroke [facility]) Diagnosis:   Patient Active Problem List   Diagnosis Date Noted  . Depression due to stroke [facility]) [I63.9, F06.31] 01/27/2016  . 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 Hypoglycemia [E16.2] 01/26/2016    Total Time spent with patient: 1 hour  Subjective:   [name] Gilbert is a 55 y.o. female patient admitted with "I am not depressed".  HPI:  Patient interviewed. Her daughter was also present and the patient requested the daughter stay. Consult for concerns about depression in this patient who has presented multiple times to the hospital with dangerously low blood sugars and loss of consciousness. She per gives ended to the hospital again this time with blood sugars extraordinarily low she was passed out. Patient is aware that this is why she came into the hospital. She tells me she thinks it's the only time it's ever happen. Daughter points out correctly that it has been multiple times. Patient says that perhaps it is because she does not eat as well as she should but seems somewhat unconcerned about it. Patient made it very clear that she seemed taken aback by having a psychiatry consult called and was only partially compliant with the interview. She totally denied feeling sad down or depressed. On the other hand she was unable to articulate any clear enjoyment that she has in life or anything that she is looking forward to. Patient had a stroke in 2016 which profoundly changed her life. 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 She had been working at demanding white collar job previously and has never been able to  return to work. Patient denies any suicidal thoughts. Denies any hallucinations. She admits that her sleep is restless. She says that her appetite is fine. She admits that she does drink alcohol. She claims it's only a couple times a month. I was unable to directly interview the daughter because she left before the interview was over but it seemed to me that the daughter was shaking her head during this and so I'm concerned she may be drinking a little more than that. Not abusing any drugs. Not on any psychiatric medicine or receiving any psychiatric treatment.  : She is married. Lives with her husband. Her only child is a daughter who lives in [place]. Daughter is apparently back home frequently but not full time. The patient had previously been working as an Medical illustrator in a fulfilling job. Since her stroke she has never been able to work again. She indicates that she has not stayed in touch with anyone from work and does not feel that she has any friends.  Medical history: Diabetes insulin-dependent. Status post stroke. Multiple presentations to the hospital since then for hypoglycemia.  Substance abuse history: No documented history of substance abuse. Patient admits that she drinks a couple times a month and minimizes the severity of it. I think this is a question that needs to be pursued.  Past Psychiatric History: Evidently no previous psychiatric treatment. No hospitalization no suicide attempts. As I mentioned previously the patient seemed fairly upset that I had even been called to see her and no matter how much I reassured her and tried to be supportive she was  clearly unhappy with the idea of depression. At the same time she looks like she is in a great deal of distress. Evidently no history of psychiatric medicine being prescribed.  Risk to Self: Is patient at risk for suicide?: No Risk to Others:   Prior Inpatient Therapy:   Prior Outpatient Therapy:    Past Medical History:  Past  Medical History  Diagnosis Date  . Diabetes mellitus without complication ([place])   . Hypertension   . Stroke ([place])   . Blind in both eyes    History reviewed. No pertinent past surgical history. Family History: History reviewed. No pertinent family history. Family Psychiatric  History: Patient denies knowing of any family history at all of mental health problems Social History:  History  Alcohol Use No     History  Drug Use No    Social History   Social History  . Marital Status: Married    Spouse Name: N/A  . Number of Children: N/A  . Years of Education: N/A   Social History Main Topics  . Smoking status: Never Smoker   . Smokeless tobacco: None  . Alcohol Use: No  . Drug Use: No  . Sexual Activity: Not Asked   Other Topics Concern  . None   Social History Narrative   Additional Social History:    Allergies:   Allergies  Allergen Reactions  . Ampicillin Hives  . Penicillins Hives  . Amoxicillin   . Codeine   . Metformin   . 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    Labs:  Results for orders placed or performed during the hospital encounter of 01/26/16 (from the past 48 hour(s))  Comprehensive metabolic panel     Status: None   Collection Time: 01/26/16  8:11 AM  Result Value Ref Range   Sodium HEMOLYZED SPECIMEN - SUGGEST RECOLLECT 135 - 145 mmol/L   Potassium HEMOLYZED SPECIMEN - SUGGEST RECOLLECT 3.5 - 5.1 mmol/L   GFR calc non Af Amer NOT CALCULATED >60 mL/min   GFR calc Af Amer NOT CALCULATED >60 mL/min    Comment: (NOTE) The eGFR has been calculated using the CKD EPI equation. This calculation has not been validated in all clinical situations. eGFR's persistently <60 mL/min signify possible Chronic Kidney Disease.    Anion gap NOT CALCULATED 5 - 15    Comment: Performed at [facility]  CBC     Status: Abnormal   Collection Time: 01/26/16  8:11 AM  Result Value Ref Range   WBC 7.6 3.6 - 11.0 K/uL   RBC 4.84 3.80 -  5.20 MIL/uL   Hemoglobin 13.9 12.0 - 16.0 g/dL   HCT 42.0 35.0 - 47.0 %   MCV 86.7 80.0 - 100.0 fL   MCH 28.8 26.0 - 34.0 pg   MCHC 33.2 32.0 - 36.0 g/dL   RDW 16.2 (H) 11.5 - 14.5 %   Platelets 239 150 - 440 K/uL  Ethanol     Status: None   Collection Time: 01/26/16  8:11 AM  Result Value Ref Range   Alcohol, Ethyl (B) HEMOLYZED SPECIMEN - SUGGEST RECOLLECT <5 mg/dL    Comment: Performed at [facility]  Salicylate level     Status: None   Collection Time: 01/26/16  8:11 AM  Result Value Ref Range   Salicylate Lvl HEMOLYZED SPECIMEN - SUGGEST RECOLLECT 2.8 - 30.0 mg/dL    Comment: Performed at [facility]  Acetaminophen level  Status: None   Collection Time: 01/26/16  8:11 AM  Result Value Ref Range   Acetaminophen (Tylenol), Serum HEMOLYZED SPECIMEN - SUGGEST RECOLLECT 10 - 30 ug/mL    Comment: Performed at [facility]  Troponin I     Status: None   Collection Time: 01/26/16  8:11 AM  Result Value Ref Range   Troponin I HEMOLYZED SPECIMEN - SUGGEST RECOLLECT <0.031 ng/mL    Comment: Performed at [facility]  Glucose, capillary     Status: Abnormal   Collection Time: 01/26/16  8:28 AM  Result Value Ref Range   Glucose-Capillary 144 (H) 65 - 99 mg/dL  Glucose, capillary     Status: Abnormal   Collection Time: 01/26/16 10:13 AM  Result Value Ref Range   Glucose-Capillary 121 (H) 65 - 99 mg/dL  Culture, blood (routine x 2)     Status: None (Preliminary result)   Collection Time: 01/26/16 10:15 AM  Result Value Ref Range   Specimen Description BLOOD LEFT ARM    Special Requests BOTTLES DRAWN AEROBIC AND ANAEROBIC 5ML    Culture NO GROWTH 1 DAY    Report Status PENDING   Blood gas, venous     Status: Abnormal (Preliminary result)   Collection Time: 01/26/16 10:20 AM  Result Value Ref Range   pH, Ven 7.33 7.320 - 7.430   pCO2, Ven 54 44.0 - 60.0 mmHg   pO2, Ven <31.0 (L) 31.0 - 45.0 mmHg    Comment: CRITICAL RESULT CALLED TO, READ BACK BY  AND VERIFIED WITH: Garnetta Buddy RN ON [id number] AT 1040    Bicarbonate 28.5 (H) 21.0 - 28.0 mEq/L   Acid-Base Excess 1.4 0.0 - 3.0 mmol/L   O2 Saturation PENDING %   Patient temperature 37.0    Collection site PENDING    Sample type PENDING   Culture, blood (routine x 2)     Status: None (Preliminary result)   Collection Time: 01/26/16 10:30 AM  Result Value Ref Range   Specimen Description BLOOD LEFT ARM    Special Requests BOTTLES DRAWN AEROBIC AND ANAEROBIC 5ML    Culture NO GROWTH 1 DAY    Report Status PENDING   Lactic acid, plasma     Status: None   Collection Time: 01/26/16 10:30 AM  Result Value Ref Range   Lactic Acid, Venous 1.0 0.5 - 2.0 mmol/L  Comprehensive metabolic panel     Status: Abnormal   Collection Time: 01/26/16 10:30 AM  Result Value Ref Range   Sodium 140 135 - 145 mmol/L   Potassium 3.8 3.5 - 5.1 mmol/L   Chloride 108 101 - 111 mmol/L   CO2 26 22 - 32 mmol/L   Glucose, Bld 117 (H) 65 - 99 mg/dL   BUN 28 (H) 6 - 20 mg/dL   Creatinine, Ser 1.08 (H) 0.44 - 1.00 mg/dL   Calcium 8.5 (L) 8.9 - 10.3 mg/dL   Total Protein 8.1 6.5 - 8.1 g/dL   Albumin 3.4 (L) 3.5 - 5.0 g/dL   AST 254 (H) 15 - 41 U/L   ALT 244 (H) 14 - 54 U/L   Alkaline Phosphatase 130 (H) 38 - 126 U/L   Total Bilirubin 0.7 0.3 - 1.2 mg/dL   GFR calc non Af Amer 56 (L) >60 mL/min   GFR calc Af Amer >60 >60 mL/min    Comment: (NOTE) The eGFR has been calculated using the CKD EPI equation. This calculation has not been validated in all clinical situations. eGFR's  persistently <60 mL/min signify possible Chronic Kidney Disease.    Anion gap 6 5 - 15  Acetaminophen level     Status: Abnormal   Collection Time: 01/26/16 10:30 AM  Result Value Ref Range   Acetaminophen (Tylenol), Serum <10 (L) 10 - 30 ug/mL    Comment:        THERAPEUTIC CONCENTRATIONS VARY SIGNIFICANTLY. A RANGE OF 10-30 ug/mL MAY BE AN EFFECTIVE CONCENTRATION FOR MANY PATIENTS. HOWEVER, SOME ARE BEST TREATED AT  CONCENTRATIONS OUTSIDE THIS RANGE. ACETAMINOPHEN CONCENTRATIONS >150 ug/mL AT 4 HOURS AFTER INGESTION AND >50 ug/mL AT 12 HOURS AFTER INGESTION ARE OFTEN ASSOCIATED WITH TOXIC REACTIONS.   Troponin I     Status: None   Collection Time: 01/26/16 10:30 AM  Result Value Ref Range   Troponin I <0.03 <0.031 ng/mL    Comment:        NO INDICATION OF MYOCARDIAL INJURY.   Urinalysis complete, with microscopic     Status: Abnormal   Collection Time: 01/26/16 10:40 AM  Result Value Ref Range   Color, Urine YELLOW (A) YELLOW   APPearance CLOUDY (A) CLEAR   Glucose, UA NEGATIVE NEGATIVE mg/dL   Bilirubin Urine NEGATIVE NEGATIVE   Ketones, ur NEGATIVE NEGATIVE mg/dL   Specific Gravity, Urine 1.012 1.005 - 1.030   Hgb urine dipstick 1+ (A) NEGATIVE   pH 5.0 5.0 - 8.0   Protein, ur 100 (A) NEGATIVE mg/dL   Nitrite NEGATIVE NEGATIVE   Leukocytes, UA TRACE (A) NEGATIVE   RBC / HPF 0-5 0 - 5 RBC/hpf   WBC, UA 6-30 0 - 5 WBC/hpf   Bacteria, UA MANY (A) NONE SEEN   Squamous Epithelial / LPF 0-5 (A) NONE SEEN  Urine Drug Screen, Qualitative     Status: None   Collection Time: 01/26/16 10:40 AM  Result Value Ref Range   Tricyclic, Ur Screen NONE DETECTED NONE DETECTED   Amphetamines, Ur Screen NONE DETECTED NONE DETECTED   MDMA (Ecstasy)Ur Screen NONE DETECTED NONE DETECTED   Cocaine Metabolite,Ur [place] NONE DETECTED NONE DETECTED   Opiate, Ur Screen NONE DETECTED NONE DETECTED   Phencyclidine (PCP) Ur S NONE DETECTED NONE DETECTED   Cannabinoid 50 Ng, Ur [place] NONE DETECTED NONE DETECTED   Barbiturates, Ur Screen NONE DETECTED NONE DETECTED   Benzodiazepine, Ur Scrn NONE DETECTED NONE DETECTED   Methadone Scn, Ur NONE DETECTED NONE DETECTED    Comment: (NOTE) 352  Tricyclics, urine               Cutoff 1000 ng/mL 200  Amphetamines, urine             Cutoff 1000 ng/mL 300  MDMA (Ecstasy), urine           Cutoff 500 ng/mL 400  Cocaine Metabolite, urine       Cutoff 300 ng/mL 500  Opiate,  urine                   Cutoff 300 ng/mL 600  Phencyclidine (PCP), urine      Cutoff 25 ng/mL 700  Cannabinoid, urine              Cutoff 50 ng/mL 800  Barbiturates, urine             Cutoff 200 ng/mL 900  Benzodiazepine, urine           Cutoff 200 ng/mL 1000 Methadone, urine  Cutoff 300 ng/mL 1100 1200 The urine drug screen provides only a preliminary, unconfirmed 1300 analytical test result and should not be used for non-medical 1400 purposes. Clinical consideration and professional judgment should 1500 be applied to any positive drug screen result due to possible 1600 interfering substances. A more specific alternate chemical method 1700 must be used in order to obtain a confirmed analytical result.  1800 Gas chromato graphy / mass spectrometry (GC/MS) is the preferred 1900 confirmatory method.   Urine culture     Status: Abnormal (Preliminary result)   Collection Time: 01/26/16 10:40 AM  Result Value Ref Range   Specimen Description URINE, RANDOM    Special Requests NONE    Culture >=100,000 COLONIES/mL GRAM NEGATIVE RODS (A)    Report Status PENDING   Glucose, capillary     Status: None   Collection Time: 01/26/16  2:26 PM  Result Value Ref Range   Glucose-Capillary 73 65 - 99 mg/dL  Lactic acid, plasma     Status: None   Collection Time: 01/26/16  3:06 PM  Result Value Ref Range   Lactic Acid, Venous 1.6 0.5 - 2.0 mmol/L  Glucose, capillary     Status: Abnormal   Collection Time: 01/26/16  4:47 PM  Result Value Ref Range   Glucose-Capillary 209 (H) 65 - 99 mg/dL   Comment 1 Notify RN   Glucose, capillary     Status: Abnormal   Collection Time: 01/26/16  8:35 PM  Result Value Ref Range   Glucose-Capillary 267 (H) 65 - 99 mg/dL  Glucose, capillary     Status: Abnormal   Collection Time: 01/27/16 12:28 AM  Result Value Ref Range   Glucose-Capillary 295 (H) 65 - 99 mg/dL  Glucose, capillary     Status: Abnormal   Collection Time: 01/27/16  4:04 AM   Result Value Ref Range   Glucose-Capillary 267 (H) 65 - 99 mg/dL  Glucose, capillary     Status: Abnormal   Collection Time: 01/27/16  7:43 AM  Result Value Ref Range   Glucose-Capillary 237 (H) 65 - 99 mg/dL   Comment 1 Notify RN   Glucose, capillary     Status: Abnormal   Collection Time: 01/27/16 11:29 AM  Result Value Ref Range   Glucose-Capillary 281 (H) 65 - 99 mg/dL  Glucose, capillary     Status: Abnormal   Collection Time: 01/27/16  1:19 PM  Result Value Ref Range   Glucose-Capillary 251 (H) 65 - 99 mg/dL  Glucose, capillary     Status: Abnormal   Collection Time: 01/27/16  4:19 PM  Result Value Ref Range   Glucose-Capillary 300 (H) 65 - 99 mg/dL   Comment 1 Notify RN   Glucose, capillary     Status: Abnormal   Collection Time: 01/27/16  5:49 PM  Result Value Ref Range   Glucose-Capillary 258 (H) 65 - 99 mg/dL    Current Facility-Administered Medications  Medication Dose Route Frequency Provider Last Rate Last Dose  . acetaminophen (TYLENOL) tablet 650 mg  650 mg Oral Q6H PRN [name], MD       Or  . acetaminophen (TYLENOL) suppository 650 mg  650 mg Rectal Q6H PRN [name], MD      . amLODipine (NORVASC) tablet 10 mg  10 mg Oral Daily Vaughan Basta, MD   10 mg at 01/27/16 1753  . aspirin chewable tablet 81 mg  81 mg Oral Daily [name], MD   81 mg at 01/27/16 1040  .  atorvastatin (LIPITOR) tablet 40 mg  40 mg Oral Daily [name], MD   40 mg at 01/27/16 1040  . carvedilol (COREG) tablet 25 mg  25 mg Oral Daily Vaughan Basta, MD   25 mg at 01/27/16 1753  . [START ON 01/28/2016] cefTRIAXone (ROCEPHIN) 1 g in dextrose 5 % 50 mL IVPB  1 g Intravenous Q24H Crystal G Scarpena, RPH      . enoxaparin (LOVENOX) injection 40 mg  40 mg Subcutaneous Q24H [name], MD   40 mg at 01/27/16 1753  . hydrochlorothiazide (HYDRODIURIL) tablet 25 mg  25 mg Oral Daily Vaughan Basta, MD   25 mg at 01/27/16 1753  . insulin aspart (novoLOG) injection 0-5 Units   0-5 Units Subcutaneous QHS Lance Coon, MD      . insulin aspart (novoLOG) injection 0-9 Units  0-9 Units Subcutaneous TID WC Lance Coon, MD   5 Units at 01/27/16 1754  . lisinopril (PRINIVIL,ZESTRIL) tablet 40 mg  40 mg Oral Daily Vaughan Basta, MD   40 mg at 01/27/16 1753  . mirtazapine (REMERON SOL-TAB) disintegrating tablet 15 mg  15 mg Oral QHS John T Clapacs, MD      . ondansetron (ZOFRAN) tablet 4 mg  4 mg Oral Q6H PRN [name], MD       Or  . ondansetron (ZOFRAN) injection 4 mg  4 mg Intravenous Q6H PRN [name], MD        Musculoskeletal: Strength & Muscle Tone: decreased Gait & Station: unsteady Patient leans: N/A  Psychiatric Specialty Exam: Review of Systems  Constitutional: Negative.   HENT: Negative.   Eyes: Negative.        Patient had blindness after her stroke. She has regained some vision but seems to still be quite impaired. Very sensitive to strong light.  Respiratory: Negative.   Cardiovascular: Negative.   Gastrointestinal: Negative.   Musculoskeletal: Negative.   Skin: Negative.   Neurological: Negative.   Psychiatric/Behavioral: Positive for memory loss. Negative for depression, suicidal ideas, hallucinations and substance abuse. The patient has insomnia. The patient is not nervous/anxious.     Blood pressure 115/62, pulse 73, temperature 98.3 F (36.8 C), temperature source Oral, resp. rate 16, height '5\' 2"'  (1.575 m), weight 87.317 kg (192 lb 8 oz), SpO2 100 %.Body mass index is 35.2 kg/(m^2).  General Appearance: Fairly Groomed  Engineer, water::  Minimal  Speech:  Slow  Volume:  Decreased  Mood:  Euthymic  Affect:  Flat and Inappropriate  Thought Process:  Circumstantial  Orientation:  Full (Time, Place, and Person)  Thought Content:  Negative  Suicidal Thoughts:  No  Homicidal Thoughts:  No  Memory:  Immediate;   Good Recent;   Poor Remote;   Fair  Judgement:  Impaired  Insight:  Lacking  Psychomotor Activity:  Decreased   Concentration:  Poor  Recall:  Poor  Fund of Knowledge:Fair  Language: Fair  Akathisia:  No  Handed:  Right  AIMS (if indicated):     Assets:  Financial Resources/Insurance Housing Social Support  ADL's:  Impaired  Cognition: Impaired,  Mild  Sleep:      Treatment Plan Summary: Daily contact with patient to assess and evaluate symptoms and progress in treatment, Medication management and Plan 55 year old woman status post stroke. Patient and daughter did admit that she has had a remarkable change in her personality and behavior since her stroke. There've been multiple presentations to the hospital now with hypoglycemia. It's not clear if this is because  of cognitive problems that get in the way of her being able to take care of her diabetes or whether there could possibly be some intention involved. Patient's affect and behavior was quite atypical. Although she insists on denying depression and she looks quite pained and withdrawn. When I tried to discuss treatment she became quite angry with me and immediately said she would refuse to take any antidepressants and then abruptly threw me out of her room. All in all it was a very inappropriate and strange interaction. I'm quite concerned about this patient. I suspect C she has some serious cognitive and emotional problems related to the stroke. Despite her statements I am ordering mirtazapine 15 mg at night as an attempted treatment. I will try to follow-up as best I can in the hospital.  Disposition: Supportive therapy provided about ongoing stressors.  Alethia Berthold, MD 01/27/2016 9:29 PM

## 2016-01-27 NOTE — Progress Notes (Signed)
Pt FSBS increasing. Prime Dr paged. Primary nurse spoke to Dr. Anne HahnWillis. Dr. Anne HahnWillis placed new orders. Primary nurse to continue to monitor.

## 2016-01-28 LAB — URINE CULTURE

## 2016-01-28 LAB — GLUCOSE, CAPILLARY
GLUCOSE-CAPILLARY: 201 mg/dL — AB (ref 65–99)
Glucose-Capillary: 242 mg/dL — ABNORMAL HIGH (ref 65–99)

## 2016-01-28 MED ORDER — INSULIN ASPART PROT & ASPART (70-30 MIX) 100 UNIT/ML ~~LOC~~ SUSP: 10.0000 [IU] | Freq: Two times a day (BID) | SUBCUTANEOUS | Status: DC

## 2016-01-28 MED ORDER — INSULIN NPH ISOPHANE & REGULAR (70-30) 100 UNIT/ML ~~LOC~~ SUSP: 8.0000 [IU] | Freq: Every day | SUBCUTANEOUS | Status: DC

## 2016-01-28 MED ORDER — MIRTAZAPINE 15 MG PO TBDP: 15.0000 mg | ORAL_TABLET | Freq: Every day | ORAL | Status: DC

## 2016-01-28 MED ORDER — CEFUROXIME AXETIL 250 MG PO TABS: 250.0000 mg | ORAL_TABLET | Freq: Two times a day (BID) | ORAL | Status: DC

## 2016-01-28 NOTE — Progress Notes (Signed)
[facility] [place] at [facility]   PATIENT NAME: [name] GrandchildJeannette Szymborski    MR#:  [id number]  DATE OF BIRTH:  March 13, 1961  SUBJECTIVE:  CHIEF COMPLAINT:  No chief complaint on file.  Brought with episode fo hypoglycemia. Had multiple hospital visits for same reason.  She claims to be eating good, but her husband in room contradict it, and she was very angree and upset with him for telling that. Earlier her daughter was also concern for same and she spoke to nurse for some psych issues. Her blood sugar is now under control after holding insulin.  REVIEW OF SYSTEMS:  CONSTITUTIONAL: No fever, fatigue or weakness.  EYES: No blurred or double vision.  EARS, NOSE, AND THROAT: No tinnitus or ear pain.  RESPIRATORY: No cough, shortness of breath, wheezing or hemoptysis.  CARDIOVASCULAR: No chest pain, orthopnea, edema.  GASTROINTESTINAL: No nausea, vomiting, diarrhea or abdominal pain.  GENITOURINARY: No dysuria, hematuria.  ENDOCRINE: No polyuria, nocturia,  HEMATOLOGY: No anemia, easy bruising or bleeding SKIN: No rash or lesion. MUSCULOSKELETAL: No joint pain or arthritis.   NEUROLOGIC: No tingling, numbness, weakness.  PSYCHIATRY: No anxiety or depression.   ROS  DRUG ALLERGIES:   Allergies  Allergen Reactions  . Ampicillin Hives  . Penicillins Hives  . Amoxicillin   . Codeine   . Metformin   . 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    VITALS:  Blood pressure 151/70, pulse 72, temperature 98 F (36.7 C), temperature source Oral, resp. rate 16, height 5\' 2"  (1.575 m), weight 87.317 kg (192 lb 8 oz), SpO2 100 %.  PHYSICAL EXAMINATION:  GENERAL:  55 y.o.-year-old patient lying in the bed with no acute distress.  EYES: Pupils equal, round, reactive to light and accommodation. No scleral icterus. Extraocular muscles intact.  HEENT: Head atraumatic, normocephalic. Oropharynx and nasopharynx clear.  NECK:  Supple, no jugular venous distention. No  thyroid enlargement, no tenderness.  LUNGS: Normal breath sounds bilaterally, no wheezing, rales,rhonchi or crepitation. No use of accessory muscles of respiration.  CARDIOVASCULAR: S1, S2 normal. No murmurs, rubs, or gallops.  ABDOMEN: Soft, nontender, nondistended. Bowel sounds present. No organomegaly or mass.  EXTREMITIES: No pedal edema, cyanosis, or clubbing.  NEUROLOGIC: Cranial nerves II through XII are intact. Muscle strength 5/5 in all extremities. Sensation intact. Gait not checked.  PSYCHIATRIC: The patient is alert and oriented x 3.  SKIN: No obvious rash, lesion, or ulcer.   Physical Exam LABORATORY PANEL:   CBC  Recent Labs Lab 01/26/16 0811  WBC 7.6  HGB 13.9  HCT 42.0  PLT 239   ------------------------------------------------------------------------------------------------------------------  Chemistries   Recent Labs Lab 01/26/16 1030  NA 140  K 3.8  CL 108  CO2 26  GLUCOSE 117*  BUN 28*  CREATININE 1.08*  CALCIUM 8.5*  AST 254*  ALT 244*  ALKPHOS 130*  BILITOT 0.7   ------------------------------------------------------------------------------------------------------------------  Cardiac Enzymes  Recent Labs Lab 01/26/16 0811 01/26/16 1030  TROPONINI HEMOLYZED SPECIMEN - SUGGEST RECOLLECT <0.03   ------------------------------------------------------------------------------------------------------------------  RADIOLOGY:  Ct Head Wo Contrast  01/26/2016  CLINICAL DATA:  Found unresponsive EXAM: CT HEAD WITHOUT CONTRAST TECHNIQUE: Contiguous axial images were obtained from the base of the skull through the vertex without intravenous contrast. COMPARISON:  12/13/2015 FINDINGS: The bony calvarium is intact. 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 No findings to suggest acute hemorrhage, acute infarction or space-occupying mass lesion are noted. IMPRESSION: Chronic changes without acute abnormality.  Electronically Signed   By: Alcide Clever M.D.   On: 01/26/2016 09:19   Dg Chest Portable 1 View  01/26/2016  CLINICAL DATA:  Altered mental status.  Found unresponsive. EXAM: PORTABLE CHEST 1 VIEW COMPARISON:  12/06/2014 FINDINGS: The cardiomediastinal silhouette is within normal limits. The lungs are well inflated and clear. There is no evidence of pleural effusion or pneumothorax. No acute osseous abnormality is identified. IMPRESSION: No active disease. Electronically Signed   By: Sebastian Ache M.D.   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 female with a known history of To 2 diabetes on insulin, hypertension comes to the emergency room after she was found unresponsive on the floor this morning by her daughter. She did not eat dinner last night due to nocturia and well. The patient continues to take her insulin. She was found to have blood sugar of 30. EMS gave an amp of D50  1. Acute altered mental status/unresponsiveness secondary to Severe hypoglycemia secondary to poor by mouth intake and continuing use of her insulin -initially given dextrose with normal saline drip. Stopped last night, as blood sugar is stable. -started on sliding scale insulin. -Patient encouraged to eat.  2. Hypothermia suspected due to #1 and UTI -Patient's temperature appears normal. She was kept on a bear hugger. Received IV fluids.  3. UTI continue IV Rocephin and follow blood cultures and urine cultures  4. Hypertension Patient on admission has relative hypotension. Held off on BP meds. Resume now as blood pressure stable  5. DVT prophylaxis subcutaneous Lovenox  6. Depression deu to stroke    Family is very concerned about this   Called psych consult.   All the records are reviewed and case discussed with Care  Management/Social Workerr. Management plans discussed with the patient, family and they are in agreement.  CODE STATUS: FUll  TOTAL TIME TAKING CARE OF THIS PATIENT: 35 minutes.     POSSIBLE D/C IN 1-2 DAYS, DEPENDING ON CLINICAL CONDITION.   Altamese Dilling M.D on 01/28/2016   Between 7am to 6pm - Pager - [phone]  After 6pm go to [email or web address] - Social research officer, government  [facility] [place]  Office  [phone]  CC: Primary care physician; No PCP Per Patient  Note: This dictation was prepared with Dragon dictation along with smaller phrase technology. Any transcriptional errors that result from this process are unintentional.

## 2016-01-28 NOTE — Discharge Summary (Signed)
[facility] [place] at 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 Diabetes mellitus without complication (HCC)   . Hypertension   . Stroke (HCC)   . Blind in both eyes     HOSPITAL COURSE:   [name] Gilbert Cilia is a 55 y.o. female with a known history of To 2 diabetes on insulin, hypertension comes to the emergency room after she was found unresponsive on the floor this morning by her daughter. She did not eat dinner last night due to nocturia and well. The patient continues to take her insulin. She was found to have blood sugar of 30. EMS gave an amp of D50  1. Acute altered mental status/unresponsiveness secondary to Severe hypoglycemia secondary to poor by mouth intake and continuing use of her insulin -initially given dextrose with normal saline drip. Stopped last night, as blood sugar is stable. -started on sliding scale insulin. -Patient encouraged to eat. - Advised to take only morning insulin and skip night time insulin dose for next few days, and keep record of her blodo sugar and follow with PMD.  2. Hypothermia suspected due to #1 and UTI -Patient's temperature appears normal. She was kept on a bear hugger. Received IV fluids.  3. UTI continue IV Rocephin and follow blood cultures and urine cultures   As per Urine cx- switch to cefuroxime.  4. Hypertension Patient on admission has  relative hypotension. Held off on BP meds. Resume now as blood pressure stable  5. DVT prophylaxis subcutaneous Lovenox  6. Depression deu to stroke   Called psych consult.    He suggested Remeron, I gave prescription, but pt is saying " I am not going to take it, as I don't need it."  DISCHARGE CONDITIONS:   Stable.  CONSULTS OBTAINED:  Treatment Team:  Audery AmelJohn T Clapacs, MD Shane CrutchPradeep Ramachandran, MD  DRUG ALLERGIES:   Allergies  Allergen Reactions  . Ampicillin Hives  . Penicillins Hives  . Amoxicillin   . Codeine   . Metformin   . 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    DISCHARGE MEDICATIONS:   Current Discharge Medication List    START taking these medications   Details  cefUROXime (CEFTIN) 250 MG tablet Take 1 tablet (250 mg total) by mouth 2 (two) times daily with a meal. Qty: 8 tablet, Refills: 0    mirtazapine (REMERON SOL-TAB) 15 MG disintegrating tablet Take 1 tablet (15 mg total) by mouth at bedtime. Qty: 30 tablet, Refills: 0      CONTINUE these medications which have CHANGED   Details  insulin NPH-regular Human (NOVOLIN 70/30) (70-30) 100 UNIT/ML injection Inject 8-20 Units into the skin daily with breakfast. 20 units in the morning and 8 units in the evening. Qty: 10 mL, Refills: 11      CONTINUE these medications which have NOT CHANGED   Details  amLODipine (NORVASC) 10 MG tablet Take 10 mg by  mouth daily.    aspirin 81 MG chewable tablet Chew 81 mg by mouth daily.    atorvastatin (LIPITOR) 40 MG tablet Take 40 mg by mouth daily.    carvedilol (COREG) 25 MG tablet Take 25 mg by mouth 2 (two) times daily.    lisinopril-hydrochlorothiazide (PRINZIDE,ZESTORETIC) 20-12.5 MG per tablet Take 2 tablets by mouth daily.      STOP taking these medications     levofloxacin (LEVAQUIN) 750 MG tablet          DISCHARGE INSTRUCTIONS:    Follow with PMD in 1-2 weeks.  If you experience worsening of your admission symptoms,  develop shortness of breath, life threatening emergency, suicidal or homicidal thoughts you must seek medical attention immediately by calling 911 or calling your MD immediately  if symptoms less severe.  You Must read complete instructions/literature along with all the possible adverse reactions/side effects for all the Medicines you take and that have been prescribed to you. Take any new Medicines after you have completely understood and accept all the possible adverse reactions/side effects.   Please note  You were cared for by a hospitalist during your hospital stay. If you have any questions about your discharge medications or the care you received while you were in the hospital after you are discharged, you can call the unit and asked to speak with the hospitalist on call if the hospitalist that took care of you is not available. Once you are discharged, your primary care physician will handle any further medical issues. Please note that NO REFILLS for any discharge medications will be authorized once you are discharged, as it is imperative that you return to your primary care physician (or establish a relationship with a primary care physician if you do not have one) for your aftercare needs so that they can reassess your need for medications and monitor your lab values.    Today   CHIEF COMPLAINT:  No chief complaint on file.   HISTORY OF PRESENT ILLNESS:  [name] Gilbert  is a 55 y.o. female with a known history of 2 diabetes on insulin, hypertension comes to the emergency room after she was found unresponsive on the floor this morning by her daughter. She did not eat dinner last night due to nocturia and well. The patient continues to take her insulin. She was found to have blood sugar of 30. EMS gave an amp of D50. She responded blood sugar went up to 190 and decreased again to 74. She got another dose of D50 now on D5 normal saline.  Patient was also found hypothermic with temperature  of 93. She is on a bear hugger and appears to be well oriented 3. She is being admitted with hypoglycemia hypothermia and UTI   VITAL SIGNS:  Blood pressure 151/70, pulse 72, temperature 98 F (36.7 C), temperature source Oral, resp. rate 16, height  (1.575 m), weight 87.317 kg (192 lb 8 oz), SpO2 100 %.  I/O:   Intake/Output Summary (Last 24 hours) at 01/28/16 1456 Last data filed at 01/28/16 1300  Gross per 24 hour  Intake    240 ml  Output      0 ml  Net    240 ml    PHYSICAL EXAMINATION:  GENERAL:  55 y.o.-year-old patient lying in the bed with no acute distress.  EYES: Pupils equal, round, reactive to light and accommodation. No scleral icterus. Extraocular muscles intact.  HEENT: Head atraumatic, normocephalic. Oropharynx and nasopharynx clear.  NECK:  Supple, no jugular venous distention. No thyroid enlargement, no tenderness.  LUNGS: Normal breath sounds bilaterally, no wheezing, rales,rhonchi or crepitation. No use of accessory muscles of respiration.  CARDIOVASCULAR: S1, S2 normal. No murmurs, rubs, or gallops.  ABDOMEN: Soft, non-tender, non-distended. Bowel sounds present. No organomegaly or mass.  EXTREMITIES: No pedal edema, cyanosis, or clubbing.  NEUROLOGIC: Cranial nerves II through XII are intact. Muscle strength 5/5 in all extremities. Sensation intact. Gait not checked.  PSYCHIATRIC: The patient is alert and oriented x 3.  SKIN: No obvious rash, lesion, or ulcer.   DATA REVIEW:   CBC  Recent Labs Lab 01/26/16 0811  WBC 7.6  HGB 13.9  HCT 42.0  PLT 239    Chemistries   Recent Labs Lab 01/26/16 1030  NA 140  K 3.8  CL 108  CO2 26  GLUCOSE 117*  BUN 28*  CREATININE 1.08*  CALCIUM 8.5*  AST 254*  ALT 244*  ALKPHOS 130*  BILITOT 0.7    Cardiac Enzymes  Recent Labs Lab 01/26/16 1030  TROPONINI <0.03    Microbiology Results  Results for orders placed or performed during the hospital encounter of 01/26/16  Culture, blood  (routine x 2)     Status: None (Preliminary result)   Collection Time: 01/26/16 10:15 AM  Result Value Ref Range Status   Specimen Description BLOOD LEFT ARM  Final   Special Requests BOTTLES DRAWN AEROBIC AND ANAEROBIC  Final   Culture NO GROWTH 1 DAY  Final   Report Status PENDING  Incomplete  Culture, blood (routine x 2)     Status: None (Preliminary result)   Collection Time: 01/26/16 10:30 AM  Result Value Ref Range Status   Specimen Description BLOOD LEFT ARM  Final   Special Requests BOTTLES DRAWN AEROBIC AND ANAEROBIC  Final   Culture NO GROWTH 1 DAY  Final   Report Status PENDING  Incomplete  Urine culture     Status: Abnormal   Collection Time: 01/26/16 10:40 AM  Result Value Ref Range Status   Specimen Description URINE, RANDOM  Final   Special Requests NONE  Final   Culture >=100,000 COLONIES/mL ESCHERICHIA COLI (A)  Final   Report Status 01/28/2016 FINAL  Final   Organism ID, Bacteria ESCHERICHIA COLI (A)  Final      Susceptibility   Escherichia coli - MIC*    AMPICILLIN >=32 RESISTANT Resistant     CEFAZOLIN <=4 SENSITIVE Sensitive     CEFTRIAXONE <=1 SENSITIVE Sensitive     CIPROFLOXACIN >=4 RESISTANT Resistant     GENTAMICIN 2 SENSITIVE Sensitive     IMIPENEM <=0.25 SENSITIVE Sensitive     NITROFURANTOIN <=16 SENSITIVE Sensitive     TRIMETH/SULFA <=20 SENSITIVE Sensitive     AMPICILLIN/SULBACTAM >=32 RESISTANT Resistant     PIP/TAZO 8 SENSITIVE Sensitive     Extended ESBL NEGATIVE Sensitive     * >=100,000 COLONIES/mL ESCHERICHIA COLI    RADIOLOGY:  No results found.  EKG:   Orders placed or performed during the hospital encounter of 01/26/16  . EKG 12-Lead  . EKG 12-Lead  . ED EKG  . ED EKG      Management plans discussed with the patient, family and they are in agreement.  CODE STATUS:     Code Status Orders        Start     Ordered   01/26/16 1447  Full code   Continuous     01/26/16 1446    Code  Status History    Date  Active Date Inactive Code Status Order ID Comments User Context   This patient has a current code status but no historical code status.      TOTAL TIME TAKING CARE OF THIS PATIENT: 35 minutes.    Altamese Dilling M.D on 01/28/2016 at 2:56 PM  Between 7am to 6pm - Pager - [phone]  After 6pm go to [email or web address] - Social research officer, government  [facility] [facility]  Office  [phone]  CC: Primary care physician; No PCP Per Patient   Note: This dictation was prepared with Dragon dictation along with smaller phrase technology. Any transcriptional errors that result from this process are unintentional.

## 2016-01-28 NOTE — Progress Notes (Signed)
Pt stable. IV removed. D/c instructions given and education provided. Signed prescriptions verified and given. Pt states she understands instructions. Pt dressed and escorted out by staff. Driven home by family.  

## 2016-01-29 LAB — BLOOD GAS, VENOUS
ACID-BASE EXCESS: 1.4 mmol/L (ref 0.0–3.0)
Bicarbonate: 28.5 mEq/L — ABNORMAL HIGH (ref 21.0–28.0)
PATIENT TEMPERATURE: 37
pCO2, Ven: 54 mmHg (ref 44.0–60.0)
pH, Ven: 7.33 (ref 7.320–7.430)
pO2, Ven: <31 mmHg — ABNORMAL LOW (ref 31.0–45.0)

## 2016-01-31 LAB — CULTURE, BLOOD (ROUTINE X 2)
CULTURE: NO GROWTH
CULTURE: NO GROWTH

## 2016-08-21 ENCOUNTER — Encounter: Payer: Self-pay | Admitting: *Deleted

## 2016-08-21 ENCOUNTER — Inpatient Hospital Stay
Admission: EM | Admit: 2016-08-21 | Discharge: 2016-08-23 | DRG: 639 | Disposition: A | Discharge status: Home / Self Care | Payer: BLUE CROSS/BLUE SHIELD | Attending: Internal Medicine | Admitting: Internal Medicine

## 2016-08-21 DIAGNOSIS — Z880 Allergy status to penicillin: Secondary | ICD-10-CM

## 2016-08-21 DIAGNOSIS — H547 Unspecified visual loss: Secondary | ICD-10-CM | POA: Diagnosis present

## 2016-08-21 DIAGNOSIS — E11649 Type 2 diabetes mellitus with hypoglycemia without coma: Secondary | ICD-10-CM | POA: Diagnosis not present

## 2016-08-21 DIAGNOSIS — Z23 Encounter for immunization: Secondary | ICD-10-CM

## 2016-08-21 DIAGNOSIS — E860 Dehydration: Secondary | ICD-10-CM | POA: Diagnosis present

## 2016-08-21 DIAGNOSIS — Z90710 Acquired absence of both cervix and uterus: Secondary | ICD-10-CM

## 2016-08-21 DIAGNOSIS — Z888 Allergy status to other drugs, medicaments and biological substances status: Secondary | ICD-10-CM

## 2016-08-21 DIAGNOSIS — Z885 Allergy status to narcotic agent status: Secondary | ICD-10-CM

## 2016-08-21 DIAGNOSIS — E162 Hypoglycemia, unspecified: Secondary | ICD-10-CM | POA: Diagnosis present

## 2016-08-21 DIAGNOSIS — Z8673 Personal history of transient ischemic attack (TIA), and cerebral infarction without residual deficits: Secondary | ICD-10-CM

## 2016-08-21 DIAGNOSIS — Z79899 Other long term (current) drug therapy: Secondary | ICD-10-CM

## 2016-08-21 DIAGNOSIS — R410 Disorientation, unspecified: Secondary | ICD-10-CM | POA: Diagnosis not present

## 2016-08-21 DIAGNOSIS — Z833 Family history of diabetes mellitus: Secondary | ICD-10-CM

## 2016-08-21 DIAGNOSIS — Z794 Long term (current) use of insulin: Secondary | ICD-10-CM

## 2016-08-21 DIAGNOSIS — N179 Acute kidney failure, unspecified: Secondary | ICD-10-CM | POA: Diagnosis present

## 2016-08-21 DIAGNOSIS — E161 Other hypoglycemia: Secondary | ICD-10-CM | POA: Diagnosis present

## 2016-08-21 DIAGNOSIS — I10 Essential (primary) hypertension: Secondary | ICD-10-CM | POA: Diagnosis present

## 2016-08-21 LAB — GLUCOSE, CAPILLARY: GLUCOSE-CAPILLARY: 37 mg/dL — AB (ref 65–99)

## 2016-08-21 MED ORDER — SODIUM CHLORIDE 0.9 % IV BOLUS (SEPSIS)
500.0000 mL | Freq: Once | INTRAVENOUS | Status: AC
  Administered 2016-08-22: 500 mL via INTRAVENOUS

## 2016-08-21 MED ORDER — DEXTROSE 50 % IV SOLN
INTRAVENOUS | Status: AC
  Administered 2016-08-22: 50 mL
  Filled 2016-08-21: qty 50

## 2016-08-21 NOTE — ED Triage Notes (Signed)
Pt brought to room 4 from triage with blood sugar of 37.  Pt confused.

## 2016-08-22 ENCOUNTER — Emergency Department: Payer: BLUE CROSS/BLUE SHIELD

## 2016-08-22 ENCOUNTER — Encounter: Payer: Self-pay | Admitting: Emergency Medicine

## 2016-08-22 DIAGNOSIS — E161 Other hypoglycemia: Secondary | ICD-10-CM | POA: Diagnosis present

## 2016-08-22 DIAGNOSIS — Z23 Encounter for immunization: Secondary | ICD-10-CM | POA: Diagnosis not present

## 2016-08-22 DIAGNOSIS — N179 Acute kidney failure, unspecified: Secondary | ICD-10-CM | POA: Diagnosis present

## 2016-08-22 DIAGNOSIS — Z8673 Personal history of transient ischemic attack (TIA), and cerebral infarction without residual deficits: Secondary | ICD-10-CM | POA: Diagnosis not present

## 2016-08-22 DIAGNOSIS — Z880 Allergy status to penicillin: Secondary | ICD-10-CM | POA: Diagnosis not present

## 2016-08-22 DIAGNOSIS — Z90710 Acquired absence of both cervix and uterus: Secondary | ICD-10-CM | POA: Diagnosis not present

## 2016-08-22 DIAGNOSIS — R410 Disorientation, unspecified: Secondary | ICD-10-CM | POA: Diagnosis present

## 2016-08-22 DIAGNOSIS — I10 Essential (primary) hypertension: Secondary | ICD-10-CM | POA: Diagnosis present

## 2016-08-22 DIAGNOSIS — Z794 Long term (current) use of insulin: Secondary | ICD-10-CM | POA: Diagnosis not present

## 2016-08-22 DIAGNOSIS — H547 Unspecified visual loss: Secondary | ICD-10-CM | POA: Diagnosis present

## 2016-08-22 DIAGNOSIS — Z79899 Other long term (current) drug therapy: Secondary | ICD-10-CM | POA: Diagnosis not present

## 2016-08-22 DIAGNOSIS — Z885 Allergy status to narcotic agent status: Secondary | ICD-10-CM | POA: Diagnosis not present

## 2016-08-22 DIAGNOSIS — E860 Dehydration: Secondary | ICD-10-CM | POA: Diagnosis present

## 2016-08-22 DIAGNOSIS — Z888 Allergy status to other drugs, medicaments and biological substances status: Secondary | ICD-10-CM | POA: Diagnosis not present

## 2016-08-22 DIAGNOSIS — E11649 Type 2 diabetes mellitus with hypoglycemia without coma: Secondary | ICD-10-CM | POA: Diagnosis present

## 2016-08-22 DIAGNOSIS — Z833 Family history of diabetes mellitus: Secondary | ICD-10-CM | POA: Diagnosis not present

## 2016-08-22 LAB — CBC
HCT: 36.7 % (ref 35.0–47.0)
HEMATOCRIT: 41.1 % (ref 35.0–47.0)
HEMOGLOBIN: 12.4 g/dL (ref 12.0–16.0)
Hemoglobin: 13.8 g/dL (ref 12.0–16.0)
MCH: 29.8 pg (ref 26.0–34.0)
MCH: 30.5 pg (ref 26.0–34.0)
MCHC: 33.5 g/dL (ref 32.0–36.0)
MCHC: 33.9 g/dL (ref 32.0–36.0)
MCV: 88.9 fL (ref 80.0–100.0)
MCV: 89.9 fL (ref 80.0–100.0)
PLATELETS: 257 10*3/uL (ref 150–440)
Platelets: 266 10*3/uL (ref 150–440)
RBC: 4.08 MIL/uL (ref 3.80–5.20)
RBC: 4.62 MIL/uL (ref 3.80–5.20)
RDW: 14.9 % — ABNORMAL HIGH (ref 11.5–14.5)
RDW: 15.1 % — AB (ref 11.5–14.5)
WBC: 7.9 10*3/uL (ref 3.6–11.0)
WBC: 9.5 10*3/uL (ref 3.6–11.0)

## 2016-08-22 LAB — BASIC METABOLIC PANEL
Anion gap: 5 (ref 5–15)
BUN: 25 mg/dL — AB (ref 6–20)
CALCIUM: 8.6 mg/dL — AB (ref 8.9–10.3)
CO2: 24 mmol/L (ref 22–32)
CREATININE: 1.28 mg/dL — AB (ref 0.44–1.00)
Chloride: 111 mmol/L (ref 101–111)
GFR calc non Af Amer: 45 mL/min — ABNORMAL LOW (ref >60)
GFR, EST AFRICAN AMERICAN: 53 mL/min — AB (ref >60)
Glucose, Bld: 78 mg/dL (ref 65–99)
Potassium: 4.4 mmol/L (ref 3.5–5.1)
SODIUM: 140 mmol/L (ref 135–145)

## 2016-08-22 LAB — COMPREHENSIVE METABOLIC PANEL
ALBUMIN: 3.8 g/dL (ref 3.5–5.0)
ALK PHOS: 131 U/L — AB (ref 38–126)
ALT: 94 U/L — AB (ref 14–54)
AST: 133 U/L — AB (ref 15–41)
Anion gap: 7 (ref 5–15)
BILIRUBIN TOTAL: 0.8 mg/dL (ref 0.3–1.2)
BUN: 28 mg/dL — AB (ref 6–20)
CALCIUM: 8.6 mg/dL — AB (ref 8.9–10.3)
CO2: 25 mmol/L (ref 22–32)
CREATININE: 1.47 mg/dL — AB (ref 0.44–1.00)
Chloride: 107 mmol/L (ref 101–111)
GFR calc Af Amer: 45 mL/min — ABNORMAL LOW (ref >60)
GFR calc non Af Amer: 38 mL/min — ABNORMAL LOW (ref >60)
GLUCOSE: 33 mg/dL — AB (ref 65–99)
Potassium: 4.8 mmol/L (ref 3.5–5.1)
SODIUM: 139 mmol/L (ref 135–145)
TOTAL PROTEIN: 8.9 g/dL — AB (ref 6.5–8.1)

## 2016-08-22 LAB — GLUCOSE, CAPILLARY
GLUCOSE-CAPILLARY: 118 mg/dL — AB (ref 65–99)
GLUCOSE-CAPILLARY: 124 mg/dL — AB (ref 65–99)
GLUCOSE-CAPILLARY: 175 mg/dL — AB (ref 65–99)
GLUCOSE-CAPILLARY: 58 mg/dL — AB (ref 65–99)
GLUCOSE-CAPILLARY: 63 mg/dL — AB (ref 65–99)
GLUCOSE-CAPILLARY: 90 mg/dL (ref 65–99)
Glucose-Capillary: 65 mg/dL (ref 65–99)
Glucose-Capillary: 150 mg/dL — ABNORMAL HIGH (ref 65–99)
Glucose-Capillary: 65 mg/dL (ref 65–99)
Glucose-Capillary: 141 mg/dL — ABNORMAL HIGH (ref 65–99)
Glucose-Capillary: 97 mg/dL (ref 65–99)
Glucose-Capillary: 194 mg/dL — ABNORMAL HIGH (ref 65–99)
Glucose-Capillary: 194 mg/dL — ABNORMAL HIGH (ref 65–99)
Glucose-Capillary: 246 mg/dL — ABNORMAL HIGH (ref 65–99)
Glucose-Capillary: 314 mg/dL — ABNORMAL HIGH (ref 65–99)
Glucose-Capillary: 376 mg/dL — ABNORMAL HIGH (ref 65–99)

## 2016-08-22 LAB — URINALYSIS, COMPLETE (UACMP) WITH MICROSCOPIC
Bilirubin Urine: NEGATIVE
Glucose, UA: 150 mg/dL — AB
Ketones, ur: NEGATIVE mg/dL
NITRITE: NEGATIVE
PROTEIN: 30 mg/dL — AB
Specific Gravity, Urine: 1.009 (ref 1.005–1.030)
pH: 6 (ref 5.0–8.0)

## 2016-08-22 LAB — TROPONIN I: Troponin I: <0.03 ng/mL (ref <0.03)

## 2016-08-22 MED ORDER — DEXTROSE-NACL 5-0.9 % IV SOLN
INTRAVENOUS | Status: DC
  Administered 2016-08-22: 03:00:00 via INTRAVENOUS

## 2016-08-22 MED ORDER — ATORVASTATIN CALCIUM 20 MG PO TABS
40.0000 mg | ORAL_TABLET | Freq: Every day | ORAL | Status: DC
  Administered 2016-08-22: 21:00:00 40 mg via ORAL
  Filled 2016-08-22: qty 2

## 2016-08-22 MED ORDER — SODIUM CHLORIDE 0.9 % IV SOLN
INTRAVENOUS | Status: DC
  Administered 2016-08-22: 21:00:00 via INTRAVENOUS

## 2016-08-22 MED ORDER — ENOXAPARIN SODIUM 40 MG/0.4ML ~~LOC~~ SOLN
40.0000 mg | SUBCUTANEOUS | Status: DC
  Administered 2016-08-23: 40 mg via SUBCUTANEOUS
  Filled 2016-08-22: qty 0.4

## 2016-08-22 MED ORDER — ONDANSETRON HCL 4 MG PO TABS: 4.0000 mg | ORAL_TABLET | Freq: Four times a day (QID) | ORAL | Status: DC | PRN

## 2016-08-22 MED ORDER — INFLUENZA VAC SPLIT QUAD 0.5 ML IM SUSY
0.5000 mL | PREFILLED_SYRINGE | INTRAMUSCULAR | Status: AC
  Administered 2016-08-23: 09:00:00 0.5 mL via INTRAMUSCULAR
  Filled 2016-08-22: qty 0.5

## 2016-08-22 MED ORDER — DEXTROSE-NACL 5-0.9 % IV SOLN
INTRAVENOUS | Status: DC
  Administered 2016-08-22: 05:00:00 via INTRAVENOUS

## 2016-08-22 MED ORDER — ONDANSETRON HCL 4 MG/2ML IJ SOLN: 4.0000 mg | Freq: Four times a day (QID) | INTRAMUSCULAR | Status: DC | PRN

## 2016-08-22 MED ORDER — CARVEDILOL 12.5 MG PO TABS
12.5000 mg | ORAL_TABLET | Freq: Two times a day (BID) | ORAL | Status: DC
  Administered 2016-08-22 – 2016-08-23 (×2): 12.5 mg via ORAL
  Filled 2016-08-22 (×3): qty 1

## 2016-08-22 MED ORDER — AMLODIPINE BESYLATE 5 MG PO TABS
5.0000 mg | ORAL_TABLET | Freq: Every day | ORAL | Status: DC
  Administered 2016-08-22 – 2016-08-23 (×2): 5 mg via ORAL
  Filled 2016-08-22 (×2): qty 1

## 2016-08-22 NOTE — ED Notes (Signed)
Pt up to bathroom with assistance 

## 2016-08-22 NOTE — H&P (Signed)
[facility] [place] [facility]   PATIENT NAME: [name] Gilbert    MR#:  [id number]  DATE OF BIRTH:  1960/12/02  DATE OF ADMISSION:  08/21/2016  PRIMARY CARE PHYSICIAN: No PCP Per Patient   REQUESTING/REFERRING PHYSICIAN:   CHIEF COMPLAINT:   Chief Complaint  Patient presents with  . Hypoglycemia    HISTORY OF PRESENT ILLNESS: [name] GrandchildJeannette Ohmer  is a 55 y.o. female with a known history of Diabetes mellitus2 hypertension, CVA presented to the emergency room with confusion and disorientation. Patient was cold and sweaty and confused according to the family member. No history of fall or head injury. Blood sugars were very low when she presented to the emergency room as well as 37 mg/dL. Subsequent follow blood sugars were also low. Patient was evaluated for hypoglycemia and hospitalist service was consulted for further care of the patient. When patient was seen patient was awake and alert and oriented blood sugars improved slightly but still on the lower side. She was able to answer to verbal commands. No complaints of any chest pain, shortness of breath. No headache and dizziness. She of any witnessed seizures. No history of any syncope. Patient was worked up with CT head without contrast which showed old CVA.  PAST MEDICAL HISTORY:   Past Medical History:  Diagnosis Date  . Blind in both eyes   . Diabetes mellitus without complication (HCC)   . Hypertension   . Stroke [facility](HCC)     PAST SURGICAL HISTORY: Past Surgical History:  Procedure Laterality Date  . ABDOMINAL HYSTERECTOMY    . TONSILLECTOMY      SOCIAL HISTORY:  Social History  Substance Use Topics  . Smoking status: Never Smoker  . Smokeless tobacco: Never Used  . Alcohol use No    FAMILY HISTORY:  Family History  Problem Relation Age of Onset  . Diabetes Father     DRUG ALLERGIES:  Allergies  Allergen Reactions  . Penicillins Hives, Nausea And Vomiting and Swelling    Has patient  had a PCN reaction causing immediate rash, facial/tongue/throat swelling, SOB or lightheadedness with hypotension: Yes Has patient had a PCN reaction causing severe rash involving mucus membranes or skin necrosis: No Has patient had a PCN reaction that required hospitalization Yes Has patient had a PCN reaction occurring within the last 10 years: No If all of the above answers are "NO", then may proceed with Cephalosporin use.   . Codeine Other (See Comments)    Reaction: unknown  . Metformin Other (See Comments)    Reaction: unknown  . 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  EYES: No blurred or double vision.  EARS, NOSE, AND THROAT: No tinnitus or ear pain.  RESPIRATORY: No cough, shortness of breath, wheezing or hemoptysis.  CARDIOVASCULAR: No chest pain, orthopnea, edema.  GASTROINTESTINAL: No nausea, vomiting, diarrhea or abdominal pain.  GENITOURINARY: No dysuria, hematuria.  ENDOCRINE: No polyuria, nocturia,  HEMATOLOGY: No anemia, easy bruising or bleeding SKIN: No rash or lesion. MUSCULOSKELETAL: No joint pain or arthritis.   NEUROLOGIC: No tingling, numbness, weakness.  Disoriented at initial presentation PSYCHIATRY: No anxiety or depression.   MEDICATIONS AT HOME:  Prior to Admission medications   Medication Sig Start Date End Date Taking? Authorizing Provider  amLODipine (NORVASC) 2.5 MG tablet Take 5 mg by mouth daily.    Yes Historical Provider, MD  atorvastatin (LIPITOR) 40  MG tablet Take 40 mg by mouth daily.   Yes Historical Provider, MD  carvedilol (COREG) 12.5 MG tablet Take 12.5 mg by mouth 2 (two) times daily.    Yes Historical Provider, MD  insulin NPH-regular Human (NOVOLIN 70/30) (70-30) 100 UNIT/ML injection Inject 8-20 Units into the skin daily with breakfast. 20 units in the morning and 8 units in the evening. 01/28/16  Yes Altamese DillingVaibhavkumar Vachhani, MD   lisinopril-hydrochlorothiazide (PRINZIDE,ZESTORETIC) 20-12.5 MG per tablet Take 2 tablets by mouth daily.   Yes Historical Provider, MD      PHYSICAL EXAMINATION:   VITAL SIGNS: Blood pressure (!) 165/77, pulse 94, temperature 97.6 F (36.4 C), temperature source Oral, resp. rate 18, height 5\' 6"  (1.676 m), weight 83 kg (183 lb), SpO2 100 %.  GENERAL:  55 y.o.-year-old patient lying in the bed with no acute distress.  EYES: Pupils equal bilaterally. No scleral icterus. Extraocular muscles intact.  HEENT: Head atraumatic, normocephalic. Oropharynx and nasopharynx clear.  NECK:  Supple, no jugular venous distention. No thyroid enlargement, no tenderness.  LUNGS: Normal breath sounds bilaterally, no wheezing, rales,rhonchi or crepitation. No use of accessory muscles of respiration.  CARDIOVASCULAR: S1, S2 normal. No murmurs, rubs, or gallops.  ABDOMEN: Soft, nontender, nondistended. Bowel sounds present. No organomegaly or mass.  EXTREMITIES: No pedal edema, cyanosis, or clubbing.  NEUROLOGIC: Cranial nerves II through XII are intact. Muscle strength 5/5 in all extremities. Sensation intact. Gait not checked.  PSYCHIATRIC: The patient is alert and oriented x 3.  SKIN: No obvious rash, lesion, or ulcer.   LABORATORY PANEL:   CBC  Recent Labs Lab 08/21/16 2345  WBC 9.5  HGB 13.8  HCT 41.1  PLT 266  MCV 88.9  MCH 29.8  MCHC 33.5  RDW 14.9*   ------------------------------------------------------------------------------------------------------------------  Chemistries   Recent Labs Lab 08/21/16 2345  NA 139  K 4.8  CL 107  CO2 25  GLUCOSE 33*  BUN 28*  CREATININE 1.47*  CALCIUM 8.6*  AST 133*  ALT 94*  ALKPHOS 131*  BILITOT 0.8   ------------------------------------------------------------------------------------------------------------------ estimated creatinine clearance is 45.9 mL/min (by C-G formula based on SCr of 1.47 mg/dL  (H)). ------------------------------------------------------------------------------------------------------------------ No results for input(s): TSH, T4TOTAL, T3FREE, THYROIDAB in the last 72 hours.  Invalid input(s): FREET3   Coagulation profile No results for input(s): INR, PROTIME in the last 168 hours. ------------------------------------------------------------------------------------------------------------------- No results for input(s): DDIMER in the last 72 hours. -------------------------------------------------------------------------------------------------------------------  Cardiac Enzymes  Recent Labs Lab 08/21/16 2345  TROPONINI <0.03   ------------------------------------------------------------------------------------------------------------------ Invalid input(s): POCBNP  ---------------------------------------------------------------------------------------------------------------  Urinalysis    Component Value Date/Time   COLORURINE YELLOW (A) 08/21/2016 2345   APPEARANCEUR HAZY (A) 08/21/2016 2345   APPEARANCEUR Clear 12/06/2014 0133   LABSPEC 1.009 08/21/2016 2345   LABSPEC 1.011 12/06/2014 0133   PHURINE 6.0 08/21/2016 2345   GLUCOSEU 150 (A) 08/21/2016 2345   GLUCOSEU >=500 12/06/2014 0133   HGBUR SMALL (A) 08/21/2016 2345   BILIRUBINUR NEGATIVE 08/21/2016 2345   BILIRUBINUR Negative 12/06/2014 0133   KETONESUR NEGATIVE 08/21/2016 2345   PROTEINUR 30 (A) 08/21/2016 2345   NITRITE NEGATIVE 08/21/2016 2345   LEUKOCYTESUR MODERATE (A) 08/21/2016 2345   LEUKOCYTESUR Negative 12/06/2014 0133     RADIOLOGY: Ct Head Wo Contrast  Result Date: 08/22/2016 CLINICAL DATA:  55 year old female with altered mental status. No known injury. History of prior CVA. EXAM: CT HEAD WITHOUT CONTRAST TECHNIQUE: Contiguous axial images were obtained from the base of the skull through the vertex without intravenous contrast. COMPARISON:  Head CT dated 01/26/2016  FINDINGS: Brain: The ventricles and sulci are appropriate in size for the patient's age. Areas of old infarct and encephalomalacia noted in the left occipital lobe as well as right parietal convexity. There is associated mild ex vacuo dilatation of the trigone of the left lateral ventricle. Mild periventricular and deep white matter chronic microvascular ischemic changes noted. There is no acute intracranial hemorrhage. No mass effect or midline shift noted. No extra-axial fluid collection. Vascular: No hyperdense vessel or unexpected calcification. Skull: Normal. Negative for fracture or focal lesion. Sinuses/Orbits: No acute finding. Other: None IMPRESSION: No acute intracranial pathology. 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 Electronically Signed   By: Elgie Collard M.D.   On: 08/22/2016 01:13    EKG: Orders placed or performed during the hospital encounter of 08/21/16  . EKG 12-Lead  . EKG 12-Lead    IMPRESSION AND PLAN: 55 year old female patient with history of CVA, type 2 diabetes mellitus, hypertension presented to the emergency room with disorientation and low blood sugar. Admitting diagnosis 1. Altered mental status secondary to hypoglycemia 2. Hypoglycemia 3. Abnormal liver function tests 4. Hypertension Treatment plan Admit patient to medical floor IV fluid hydration with D5 normal saline Monitor blood sugars closely Follow-up liver function tests Resume home medication Avoid diabetic medication for now Supportive care.  All the records are reviewed and case discussed with ED provider. Management plans discussed with the patient, family and they are in agreement.  CODE STATUS: Code Status History    Date Active Date Inactive Code Status Order ID Comments User Context   01/26/2016  2:46 PM 01/28/2016  7:25 PM Full Code [id number]  Enedina Finner, MD ED       TOTAL TIME TAKING CARE OF THIS PATIENT:51 minutes.    Ihor Austin  M.D on 08/22/2016 at 2:56 AM  Between 7am to 6pm - Pager - [phone]  After 6pm go to [email or web address] - password [email or web address] [facility]  [place] [facility]  Office  [phone]  CC: Primary care physician; No PCP Per Patient

## 2016-08-22 NOTE — Progress Notes (Signed)
Inpatient Diabetes Program Recommendations  AACE/ADA: New Consensus Statement on Inpatient Glycemic Control (2015)  Target Ranges:  Prepandial:   less than 140 mg/dL      Peak postprandial:   less than 180 mg/dL (1-2 hours)      Critically ill patients:  140 - 180 mg/dL   Results for Gilbert Gilbert (MRN [id number]) as of 08/22/2016 13:53  Ref. Range 08/21/2016 23:34 08/22/2016 00:22 08/22/2016 01:32 08/22/2016 02:39 08/22/2016 04:02 08/22/2016 06:18 08/22/2016 06:20 08/22/2016 06:40 08/22/2016 07:22 08/22/2016 08:01 08/22/2016 09:59 08/22/2016 11:53  Glucose-Capillary Latest Ref Range: 65 - 99 mg/dL 37 (LL) 409175 (H) 90 65 811118 (H) 63 (L) 58 (L) 65 150 (H) 141 (H) 97 124 (H)   Review of Glycemic Control  Diabetes history: DM2 Outpatient Diabetes medications: 70/30 20 units with breakfast, 70/30 8 units with supper Current orders for Inpatient glycemic control: CBGs Q2H  Inpatient Diabetes Program Recommendations: Outpatient Referral: Recommend patient be referred to Endocrinologist to assist with improving DM control. Insulin-Correction: Once glucose is consistently greater than 150 mg/dl without any treatment of hypoglycemia, please consider ordering Novolog 0-9 units Q4H. Outpatient DM regimen: At time of discharge, MD may want to provide guidelines for patient to use reduced dose of 70/30 if she is not able to eat a full meal.  NOTE: Spoke with patient about diabetes and home regimen for diabetes control. Patient reports that she is followed by PCP for diabetes management and currently she takes 70/30 20 units with breakfast and 70/30 8 units with supper as an outpatient for diabetes control. Patient reports that she is taking insulin as prescribed.  Patient reports that she does not always eat a full meal with insulin, "I eat until I get full and sometimes it doesn't take much."  Inquired about hypoglycemia and patient reports that the night prior to coming to the hospital her husband  called EMS for hypoglycemia and she was treated and not brought to the hospital. In reviewing chart, noted patient has had issues with recurrent hypoglycemia for several months. Patient states that she checks her glucose 2 times per day and that it is usually in the 100's mg/dl in the morning and goes up in the 200-300's mg/dl during the day after eating. Inquired about potential of using Lantus insulin and patient states that she use to take Lantus insulin in the past and she feels that the 70/30 insulin works better for her and wants to continue to take 70/30. Discussed glucose and A1C goals. Discussed acute issues with hypoglycemia and long term complications of hyperglycemia.  Stressed to the patient the importance of improving glycemic control to prevent further recurrent hypoglycemia events and also avoidance of complications from uncontrolled diabetes. Inquired if patient has asked her PCP about reduced dosages of 70/30 with meals if she does not eat well and she reports that she has not asked her doctor about that.  Patient may benefit from MD guidelines to reduce 70/30 dose if she only eats half of meal. Inquired about an Endocrinologist and patient reports that she does not currently see an Actorndocrinologist. Encouraged patient to ask her PCP about being referred to an Endocrinologist to help with improving glycemic control.  Encouraged patient to check her glucose 4 times per day (before meals and at bedtime) and to keep a log book of glucose readings and insulin taken which she will need to take to doctor appointments. Explained how the doctor can use the log book to continue to make insulin adjustments if  needed. Patient verbalized understanding of information discussed and she states that she has no further questions at this time related to diabetes.  Thanks, Orlando PennerMarie Reverie Vaquera, RN, MSN, CDE Diabetes Coordinator Inpatient Diabetes Program [phone] (Team Pager)

## 2016-08-22 NOTE — ED Notes (Signed)
fsbs 175 after 1 amp d50   Dr Zenda Alperswebster aware

## 2016-08-22 NOTE — ED Notes (Signed)
Unsuccessful US IV (1xRA, 1x LA) start x2.

## 2016-08-22 NOTE — ED Provider Notes (Signed)
[facility] Emergency Department Provider Note   ____________________________________________   First MD Initiated Contact with Patient 08/21/16 2336     (approximate)  I have reviewed the triage vital signs and the nursing notes.   HISTORY  Chief Complaint Hypoglycemia    HPI [name] Gilbert is a 55 y.o. female who comes into the hospital today with hypoglycemia. According to the husband the patient checked her blood sugar at home because her eyes were glassy and she was sweaty. The patient's blood sugar was found to be 51. He reports that she seemed to be jerking so they contacted EMS. The patient received an amp of D50 but refused transport by EMS. When she arrived in the emergency department here her blood sugar was in the 30s. The patient's blood sugar prior to leaving home was in the 100s. The patient also had an episode during the weekend where her blood sugar was low and she received D50 by EMS but refused to come in. According to the patient's husband she is little confused. He states that she didn't eat much today and she hasn't been eating much because she doesn't think that food taste right. The patient takes Novolin 70/30 and according to her husband she thinks she is taking it appropriately. The patient has had a stroke in the past with some residual blurred vision and memory difficulties. The patient is staring off and is slow to respond during the history.   Past Medical History:  Diagnosis Date  . Blind in both eyes   . Diabetes mellitus without complication (HCC)   . Hypertension   . Stroke [facility])     Patient Active Problem List   Diagnosis Date Noted  . 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 Hypoglycemia 01/26/2016    History reviewed. No pertinent surgical history.  Prior to Admission medications   Medication Sig Start Date End Date Taking?  Authorizing Provider  amLODipine (NORVASC) 2.5 MG tablet Take 5 mg by mouth daily.    Yes Historical Provider, MD  atorvastatin (LIPITOR) 40 MG tablet Take 40 mg by mouth daily.   Yes Historical Provider, MD  carvedilol (COREG) 12.5 MG tablet Take 12.5 mg by mouth 2 (two) times daily.    Yes Historical Provider, MD  insulin NPH-regular Human (NOVOLIN 70/30) (70-30) 100 UNIT/ML injection Inject 8-20 Units into the skin daily with breakfast. 20 units in the morning and 8 units in the evening. 01/28/16  Yes Altamese Dilling, MD  lisinopril-hydrochlorothiazide (PRINZIDE,ZESTORETIC) 20-12.5 MG per tablet Take 2 tablets by mouth daily.   Yes Historical Provider, MD    Allergies Penicillins; Codeine; Metformin; and Statins  No family history on file.  Social History Social History  Substance Use Topics  . Smoking status: Never Smoker  . Smokeless tobacco: Never Used  . Alcohol use No    Review of Systems Constitutional: Sweats ENT: No sore throat. Cardiovascular: Denies chest pain. Respiratory: Denies shortness of breath. Gastrointestinal: No abdominal pain.  No nausea, no vomiting.  No diarrhea.  No constipation. Genitourinary: Negative for dysuria. Musculoskeletal: Negative for back pain. Skin: Negative for rash. Neurological: Confusion and slow to respond 10-point ROS otherwise negative.  ____________________________________________   PHYSICAL EXAM:  VITAL SIGNS: ED Triage Vitals  Enc Vitals Group     BP 08/21/16 2338 (!) 175/89     Pulse Rate 08/21/16 2338 100     Resp 08/21/16 2338 18  Temp 08/21/16 2338 97.6 F (36.4 C)     Temp Source 08/21/16 2338 Oral     SpO2 08/21/16 2338 99 %     Weight 08/21/16 2339 183 lb (83 kg)     Height 08/21/16 2339 5\' 6"  (1.676 m)     Head Circumference --      Peak Flow --      Pain Score --      Pain Loc --      Pain Edu? --      Excl. in GC? --     Constitutional: Alert but confused Well appearing and moderate  distress. Eyes: Conjunctivae are normal. PERRL. EOMI. Head: Atraumatic. Nose: No congestion/rhinnorhea. Mouth/Throat: Mucous membranes are moist.  Oropharynx non-erythematous. Cardiovascular: Normal rate, regular rhythm. Grossly normal heart sounds.  Good peripheral circulation. Respiratory: Normal respiratory effort.  No retractions. Lungs CTAB. Gastrointestinal: Soft and nontender. No distention.Positive bowel sounds Musculoskeletal: No lower extremity tenderness nor edema. . Neurologic:  Slow speech, slow to respond patient neurologically intact with no new focal motor or neuro deficits Skin:  Skin is warm, dry and intact.  Psychiatric: Mood and affect are normal.   ____________________________________________   LABS (all labs ordered are listed, but only abnormal results are displayed)  Labs Reviewed  GLUCOSE, CAPILLARY - Abnormal; Notable for the following:       Result Value   Glucose-Capillary 37 (*)    All other components within normal limits  CBC - Abnormal; Notable for the following:    RDW 14.9 (*)    All other components within normal limits  COMPREHENSIVE METABOLIC PANEL - Abnormal; Notable for the following:    Glucose, Bld 33 (*)    BUN 28 (*)    Creatinine, Ser 1.47 (*)    Calcium 8.6 (*)    Total Protein 8.9 (*)    AST 133 (*)    ALT 94 (*)    Alkaline Phosphatase 131 (*)    GFR calc non Af Amer 38 (*)    GFR calc Af Amer 45 (*)    All other components within normal limits  URINALYSIS, COMPLETE (UACMP) WITH MICROSCOPIC - Abnormal; Notable for the following:    Color, Urine YELLOW (*)    APPearance HAZY (*)    Glucose, UA 150 (*)    Hgb urine dipstick SMALL (*)    Protein, ur 30 (*)    Leukocytes, UA MODERATE (*)    Bacteria, UA FEW (*)    Squamous Epithelial / LPF 0-5 (*)    All other components within normal limits  GLUCOSE, CAPILLARY - Abnormal; Notable for the following:    Glucose-Capillary 175 (*)    All other components within normal limits   TROPONIN I  GLUCOSE, CAPILLARY  CBG MONITORING, ED  CBG MONITORING, ED   ____________________________________________  EKG  ED ECG REPORT I, Rebecka ApleyWebster,  Anglea Gordner P, the attending physician, personally viewed and interpreted this ECG.   Date: 08/22/2016  EKG Time: 2347  Rate: 96  Rhythm: normal sinus rhythm  Axis: normal  Intervals:none  ST&T Change: none  ____________________________________________  RADIOLOGY  CT head ____________________________________________   PROCEDURES  Procedure(s) performed: None  Procedures  Critical Care performed: No  ____________________________________________   INITIAL IMPRESSION / ASSESSMENT AND PLAN / ED COURSE  Pertinent labs & imaging results that were available during my care of the patient were reviewed by me and considered in my medical decision making (see chart for details).  This is a 55 year old female  who comes into the hospital today with hypoglycemia. The patient has had multiple episodes. I did have the patient drink some juice that she did have a difficult time obtaining an IV. The patient did start to speak more fluidly and ask where she was. We were able to obtain an IV and gave the patient D50. After the D50 the patient's blood sugar came up to 175 but it did start to decrease again. I will have the patient attempted to eat a more complex meal but I will admit the patient to the hospital for further evaluation of this hypoglycemia. We attempted to give the patient some fluids with a concern for dehydration but her IV stopped working. The patient will be admitted to the hospitalist service.  Clinical Course as of Aug 22 212  Tue Aug 22, 2016  0136 No acute intracranial pathology.  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   CT Head Wo Contrast [AW]    Clinical Course User Index [AW] Rebecka ApleyAllison P Eliah Ozawa, MD     ____________________________________________   FINAL  CLINICAL IMPRESSION(S) / ED DIAGNOSES  Final diagnoses:  Hypoglycemia      NEW MEDICATIONS STARTED DURING THIS VISIT:  New Prescriptions   No medications on file     Note:  This document was prepared using Dragon voice recognition software and may include unintentional dictation errors.    Rebecka ApleyAllison P Wayburn Shaler, MD 08/22/16 [phone]

## 2016-08-22 NOTE — ED Notes (Signed)
fsbs 90   md aware.

## 2016-08-22 NOTE — ED Notes (Signed)
Pt c/o burning with medication administration at both IV sites. RN checked IV placement - both IV's patent. MD informed. MD ordered to decrease infusion rate to 150 mL/hour. Infusion rate decreased.

## 2016-08-22 NOTE — Progress Notes (Signed)
Pt blood sugar was 63 with second check being 58. Followed hypoglycemic protocol by giving 4 ounces of juice. Blood sugar rechecked with reading of 65. Pt currently drinking 4 more ounces of grape juice (her preference). Pt has D5 NS at 1250ml/hr. On q 2 finger sticks. Will continue to monitor.

## 2016-08-22 NOTE — ED Notes (Signed)
Patient transported to CT 

## 2016-08-22 NOTE — ED Notes (Signed)
Iv hard to flush and pt has pain.   rn will try to start another iv.

## 2016-08-22 NOTE — ED Notes (Signed)
Lab called with glucose of 33  Dr Lazarus Gowdawebster awre.  Iv started after numerous attempts.  d50 given.  Pt talking.  nsr on monitor.  Skin warm and dry.

## 2016-08-22 NOTE — ED Notes (Signed)
fsbs 65  Md aware.   Pt eating dinner tray now.  Family with pt

## 2016-08-22 NOTE — Progress Notes (Signed)
[facility] - [place] at [facility]   PATIENT NAME: [name] Gilbert    MR#:  [id number]  DATE OF BIRTH:  04-26-1961  SUBJECTIVE:  CHIEF COMPLAINT:   Chief Complaint  Patient presents with  . Hypoglycemia  Was feeling lightheaded.  Her level in the blood sugar dropped, although now feeling much better.  Feels overall frustrated with diabetes management REVIEW OF SYSTEMS:  Review of Systems  Constitutional: Positive for diaphoresis and malaise/fatigue. Negative for chills, fever and weight loss.  HENT: Negative for nosebleeds and sore throat.   Eyes: Negative for blurred vision.  Respiratory: Negative for cough, shortness of breath and wheezing.   Cardiovascular: Negative for chest pain, orthopnea, leg swelling and PND.  Gastrointestinal: Negative for abdominal pain, constipation, diarrhea, heartburn, nausea and vomiting.  Genitourinary: Negative for dysuria and urgency.  Musculoskeletal: Negative for back pain.  Skin: Negative for rash.  Neurological: Positive for weakness. Negative for dizziness, speech change, focal weakness and headaches.  Endo/Heme/Allergies: Does not bruise/bleed easily.  Psychiatric/Behavioral: Negative for depression.    DRUG ALLERGIES:   Allergies  Allergen Reactions  . Penicillins Hives, Nausea And Vomiting and Swelling    Has patient had a PCN reaction causing immediate rash, facial/tongue/throat swelling, SOB or lightheadedness with hypotension: Yes Has patient had a PCN reaction causing severe rash involving mucus membranes or skin necrosis: No Has patient had a PCN reaction that required hospitalization Yes Has patient had a PCN reaction occurring within the last 10 years: No If all of the above answers are "NO", then may proceed with Cephalosporin use.   . Codeine Other (See Comments)    Reaction: unknown  . Metformin Other (See Comments)    Reaction: unknown  . 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 rate 18, height 5\' 6"  (1.676 m), weight 83 kg (183 lb), SpO2 100 %. PHYSICAL EXAMINATION:  Physical Exam  Constitutional: She is oriented to person, place, and time and well-developed, well-nourished, and in no distress.  HENT:  Head: Normocephalic and atraumatic.  Eyes: Conjunctivae and EOM are normal. Pupils are equal, round, and reactive to light.  Neck: Normal range of motion. Neck supple. No tracheal deviation present. No thyromegaly present.  Cardiovascular: Normal rate, regular rhythm and normal heart sounds.   Pulmonary/Chest: Effort normal and breath sounds normal. No respiratory distress. She has no wheezes. She exhibits no tenderness.  Abdominal: Soft. Bowel sounds are normal. She exhibits no distension. There is no tenderness.  Musculoskeletal: Normal range of motion.  Neurological: She is alert and oriented to person, place, and time. No cranial nerve deficit.  Skin: Skin is warm and dry. No rash noted.  Psychiatric: Mood and affect normal.   LABORATORY PANEL:   CBC  Recent Labs Lab 08/22/16 0529  WBC 7.9  HGB 12.4  HCT 36.7  PLT 257   ------------------------------------------------------------------------------------------------------------------ Chemistries   Recent Labs Lab 08/21/16 2345 08/22/16 0529  NA 139 140  K 4.8 4.4  CL 107 111  CO2 25 24  GLUCOSE 33* 78  BUN 28* 25*  CREATININE 1.47* 1.28*  CALCIUM 8.6* 8.6*  AST 133*  --   ALT 94*  --   ALKPHOS 131*  --   BILITOT 0.8  --    RADIOLOGY:  Ct Head Wo Contrast  Result Date: 08/22/2016 CLINICAL DATA:  55 year old female with altered mental  status. No known injury. History of prior CVA. EXAM: CT HEAD WITHOUT CONTRAST TECHNIQUE: Contiguous axial images were obtained from the base of the skull through the vertex without intravenous contrast. COMPARISON:  Head CT dated 01/26/2016 FINDINGS:  Brain: The ventricles and sulci are appropriate in size for the patient's age. Areas of old infarct and encephalomalacia noted in the left occipital lobe as well as right parietal convexity. There is associated mild ex vacuo dilatation of the trigone of the left lateral ventricle. Mild periventricular and deep white matter chronic microvascular ischemic changes noted. There is no acute intracranial hemorrhage. No mass effect or midline shift noted. No extra-axial fluid collection. Vascular: No hyperdense vessel or unexpected calcification. Skull: Normal. Negative for fracture or focal lesion. Sinuses/Orbits: No acute finding. Other: None IMPRESSION: No acute intracranial pathology. 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 Electronically Signed   By: Elgie CollardArash  Radparvar M.D.   On: 08/22/2016 01:13   ASSESSMENT AND PLAN:  55 year old female patient with history of CVA, type 2 diabetes mellitus, hypertension presented to the emergency room with disorientation and low blood sugar.  1. Altered mental status secondary to hypoglycemia - Mental status much better with improvement in blood sugar  2. Persistant Hypoglycemia - Sugar is improving while on D5 normal saline - Monitor closely - Consult diabetic nurse coordinator  3.  acute renal failure - Likely prerenal due to dehydration - Improving with IV fluids  4.  Hypertension - Stable on Norvasc and Coreg     All the records are reviewed and case discussed with Care Management/Social Worker. Management plans discussed with the patient, family and they are in agreement.  CODE STATUS: Full code  TOTAL TIME TAKING CARE OF THIS PATIENT: 35 minutes.   More than 50% of the time was spent in counseling/coordination of care: YES  POSSIBLE D/C IN 1-2 DAYS, DEPENDING ON CLINICAL CONDITION.  And blood sugar management   Delfino LovettVipul Trentyn Boisclair M.D on 08/22/2016 at 4:53 PM  Between 7am to 6pm - Pager -  [phone]  After 6pm go to [email or web address] - Scientist, research (life sciences)password EPAS [facility]  [facility] [place] [facility]  Office  [phone]  CC: Primary care physician; No PCP Per Patient  Note: This dictation was prepared with Dragon dictation along with smaller phrase technology. Any transcriptional errors that result from this process are unintentional.

## 2016-08-23 LAB — CBC
HEMATOCRIT: 33 % — AB (ref 35.0–47.0)
HEMOGLOBIN: 11 g/dL — AB (ref 12.0–16.0)
MCH: 30.2 pg (ref 26.0–34.0)
MCHC: 33.5 g/dL (ref 32.0–36.0)
MCV: 90 fL (ref 80.0–100.0)
Platelets: 210 10*3/uL (ref 150–440)
RBC: 3.66 MIL/uL — ABNORMAL LOW (ref 3.80–5.20)
RDW: 14.9 % — AB (ref 11.5–14.5)
WBC: 5.8 10*3/uL (ref 3.6–11.0)

## 2016-08-23 LAB — BASIC METABOLIC PANEL
Anion gap: 3 — ABNORMAL LOW (ref 5–15)
BUN: 27 mg/dL — ABNORMAL HIGH (ref 6–20)
CALCIUM: 8.1 mg/dL — AB (ref 8.9–10.3)
CHLORIDE: 109 mmol/L (ref 101–111)
CO2: 25 mmol/L (ref 22–32)
Creatinine, Ser: 1.39 mg/dL — ABNORMAL HIGH (ref 0.44–1.00)
GFR calc Af Amer: 48 mL/min — ABNORMAL LOW (ref >60)
GFR calc non Af Amer: 41 mL/min — ABNORMAL LOW (ref >60)
GLUCOSE: 348 mg/dL — AB (ref 65–99)
Potassium: 4.8 mmol/L (ref 3.5–5.1)
Sodium: 137 mmol/L (ref 135–145)

## 2016-08-23 LAB — GLUCOSE, CAPILLARY
GLUCOSE-CAPILLARY: 292 mg/dL — AB (ref 65–99)
GLUCOSE-CAPILLARY: 366 mg/dL — AB (ref 65–99)
GLUCOSE-CAPILLARY: 277 mg/dL — AB (ref 65–99)
GLUCOSE-CAPILLARY: 125 mg/dL — AB (ref 65–99)
Glucose-Capillary: 357 mg/dL — ABNORMAL HIGH (ref 65–99)

## 2016-08-23 MED ORDER — INSULIN ASPART PROT & ASPART (70-30 MIX) 100 UNIT/ML ~~LOC~~ SUSP
5.0000 [IU] | Freq: Two times a day (BID) | SUBCUTANEOUS | Status: DC
  Administered 2016-08-23: 11:00:00 5 [IU] via SUBCUTANEOUS
  Filled 2016-08-23: qty 5

## 2016-08-23 MED ORDER — INSULIN ASPART 100 UNIT/ML ~~LOC~~ SOLN: 0.0000 [IU] | Freq: Every day | SUBCUTANEOUS | Status: DC

## 2016-08-23 MED ORDER — INSULIN ASPART 100 UNIT/ML ~~LOC~~ SOLN
0.0000 [IU] | Freq: Three times a day (TID) | SUBCUTANEOUS | Status: DC
  Administered 2016-08-23: 8 [IU] via SUBCUTANEOUS
  Filled 2016-08-23: qty 8
  Filled 2016-08-23: qty 2

## 2016-08-23 MED ORDER — INSULIN ASPART PROT & ASPART (70-30 MIX) 100 UNIT/ML ~~LOC~~ SUSP: 5.0000 [IU] | Freq: Two times a day (BID) | SUBCUTANEOUS | 0 refills | Status: DC

## 2016-08-23 NOTE — Discharge Instructions (Signed)
Hypoglycemia  Hypoglycemia is when the sugar (glucose) level in the blood is too low. Symptoms of low blood sugar may include:  Feeling:  Hungry.  Worried or nervous (anxious).  Sweaty and clammy.  Confused.  Dizzy.  Sleepy.  Sick to your stomach (nauseous).  Having:  A fast heartbeat.  A headache.  A change in your vision.  Jerky movements that you cannot control (seizure).  Nightmares.  Tingling or no feeling (numbness) around the mouth, lips, or tongue.  Having trouble with:  Talking.  Paying attention (concentrating).  Moving (coordination).  Sleeping.  Shaking.  Passing out (fainting).  Getting upset easily (irritability). Low blood sugar can happen to people who have diabetes and people who do not have diabetes. Low blood sugar can happen quickly, and it can be an emergency. Treating Low Blood Sugar  Low blood sugar is often treated by eating or drinking something sugary right away. If you can think clearly and swallow safely, follow the 15:15 rule:  Take 15 grams of a fast-acting carb (carbohydrate). Some fast-acting carbs are:  1 tube of glucose gel.  3 sugar tablets (glucose pills).  6-8 pieces of hard candy.  4 oz (120 mL) of fruit juice.  4 oz (120 mL) of regular (not diet) soda.  Check your blood sugar 15 minutes after you take the carb.  If your blood sugar is still at or below 70 mg/dL (3.9 mmol/L), take 15 grams of a carb again.  If your blood sugar does not go above 70 mg/dL (3.9 mmol/L) after 3 tries, get help right away.  After your blood sugar goes back to normal, eat a meal or a snack within 1 hour. Treating Very Low Blood Sugar  If your blood sugar is at or below 54 mg/dL (3 mmol/L), you have very low blood sugar (severe hypoglycemia). This is an emergency. Do not wait to see if the symptoms will go away. Get medical help right away. Call your local emergency services (911 in the [place] Do not drive yourself to the  hospital. If you have very low blood sugar and you cannot eat or drink, you may need a glucagon shot (injection). A family member or friend should learn how to check your blood sugar and how to give you a glucagon shot. Ask your doctor if you need to have a glucagon shot kit at home. Follow these instructions at home: General instructions  Avoid any diets that cause you to not eat enough food. Talk with your doctor before you start any new diet.  Take over-the-counter and prescription medicines only as told by your doctor.  Limit alcohol to no more than 1 drink per day for nonpregnant women and 2 drinks per day for men. One drink equals 12 oz of beer, 5 oz of wine, or 1 oz of hard liquor.  Keep all follow-up visits as told by your doctor. This is important. If You Have Diabetes:   Make sure you know the symptoms of low blood sugar.  Always keep a source of sugar with you, such as:  Sugar.  Sugar tablets.  Glucose gel.  Fruit juice.  Regular soda (not diet soda).  Milk.  Hard candy.  Honey.  Take your medicines as told.  Follow your exercise and meal plan.  Eat on time. Do not skip meals.  Follow your sick day plan when you cannot eat or drink normally. Make this plan ahead of time with your doctor.  Check your blood sugar as  often as told by your doctor. Always check before and after exercise.  Share your diabetes care plan with:  Your work or school.  People you live with.  Check your pee (urine) for ketones:  When you are sick.  As told by your doctor.  Carry a card or wear jewelry that says you have diabetes. If You Have Low Blood Sugar From Other Causes:   Check your blood sugar as often as told by your doctor.  Follow instructions from your doctor about what you cannot eat or drink. Contact a doctor if:  You have trouble keeping your blood sugar in your target range.  You have low blood sugar often. Get help right away if:  You still have  symptoms after you eat or drink something sugary.  Your blood sugar is at or below 54 mg/dL (3 mmol/L).  You have jerky movements that you cannot control.  You pass out. These symptoms may be an emergency. Do not wait to see if the symptoms will go away. Get medical help right away. Call your local emergency services (911 in the [place] Do not drive yourself to the hospital.  This information is not intended to replace advice given to you by your health care provider. Make sure you discuss any questions you have with your health care provider. Document Released: 11/22/2009 Document Revised: 02/03/2016 Document Reviewed: 10/01/2015 Elsevier Interactive Patient Education  2017 [employer].

## 2016-08-23 NOTE — Progress Notes (Signed)
Inpatient Diabetes Program Recommendations  AACE/ADA: New Consensus Statement on Inpatient Glycemic Control (2015)  Target Ranges:  Prepandial:   less than 140 mg/dL      Peak postprandial:   less than 180 mg/dL (1-2 hours)      Critically ill patients:  140 - 180 mg/dL   Results for [name] Gilbert, [name] (MRN [id number]) as of 08/23/2016 08:28  Ref. Range 08/22/2016 09:59 08/22/2016 11:53 08/22/2016 13:52 08/22/2016 16:58 08/22/2016 18:33 08/22/2016 20:33 08/22/2016 22:50 08/23/2016 00:33 08/23/2016 02:27 08/23/2016 03:51 08/23/2016 07:56  Glucose-Capillary Latest Ref Range: 65 - 99 mg/dL 97 409124 (H) 811194 (H) 914194 (H) 246 (H) 314 (H) 376 (H) 357 (H) 292 (H) 366 (H) 277 (H)   Review of Glycemic Control  Diabetes history: DM2 Outpatient Diabetes medications: 70/30 20 units with breakfast, 70/30 8 units with supper Current orders for Inpatient glycemic control: Novolog 0-15 units TID with meals, Novolog 0-5 units QHS (both orders just started this morning)  Inpatient Diabetes Program Recommendations: Insulin - Basal: Please consider ordering 70/30 5 units BID starting now (which will provide 7 units for basal and 3 units for meal coverage per day). Outpatient Referral: Recommend patient be referred to Endocrinologist to assist with improving DM control. Diet: Please consider changing diet from Regular to Carb Modified diet.  NOTE: No insulin has been given since patient was admitted to the hospital. Glucose has ranged from 277-357 mg/dl over the past 8 hours. Novolog correction scale was just ordered this morning. At time of discharge patient may benefit from MD guidelines to reduce 70/30 dose if she only eats half of meal. Recommend patient to ask her PCP about being referred to an Endocrinologist   Thanks, Orlando PennerMarie Jadiel Schmieder, RN, MSN, CDE Diabetes Coordinator Inpatient Diabetes Program [phone] (Team Pager from 8am to 5pm)

## 2016-08-23 NOTE — Progress Notes (Signed)
Pt is being discharged today, discharge instructions given to patient, she verified understanding. Pt belongings packed and given to patient. She is currently waiting on her husband to arrive, she will be wheeled out in a wheelchair by staff.

## 2016-08-23 NOTE — Plan of Care (Signed)
Problem: Metabolic: Goal: Ability to maintain appropriate glucose levels will improve Outcome: Not Progressing Pt on q 2 hr CBG's. MD notified earlier on the shift because levels started to rise in the 300's. D5NS was discontinued and 0.9 % NS at 50 was added. Pt CBG's are currently trending downward (now <300).  A request was made to MD for parameters but none given. Also, pt being seen by in house Diabetes Educator. Will continue to monitor.

## 2016-08-24 NOTE — Discharge Summary (Signed)
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 Diabetes mellitus without complication (HCC)   . Hypertension   . Stroke (HCC)   . visual impaired    HOSPITAL COURSE:  55 year old female patient with history of CVA, type 2 diabetes mellitus, hypertension presented to the emergency room with disorientation and low blood sugar.  1.Altered mental status secondary to hypoglycemia - Mental status much better with improvement in blood sugar  2.Persistant Hypoglycemia: Initially requiring D10->D5 drip along with orange juices and d50 injections but later improved as she started eating. - resolved. Reduced dose of insulin on D/C  3. Acute renal failure - Likely prerenal due to dehydration - Resolved with IVFs  4.  Hypertension - Stable on Norvasc and Coreg  DISCHARGE CONDITIONS:  STABLE CONSULTS OBTAINED:   DRUG ALLERGIES:   Allergies  Allergen Reactions  . Penicillins Hives, Nausea And Vomiting and Swelling    Has patient had a PCN reaction causing immediate rash, facial/tongue/throat swelling, SOB or lightheadedness with hypotension: Yes Has patient had a PCN reaction causing severe rash involving mucus membranes or skin necrosis: No Has patient had a PCN reaction that required hospitalization Yes Has patient had a PCN reaction occurring within the last 10 years: No If all of the above answers are "NO", then may proceed with Cephalosporin use.   . Codeine Other (See Comments)     Reaction: unknown  . Metformin Other (See Comments)    Reaction: unknown  . 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   atorvastatin 40 MG tablet Commonly known as:  LIPITOR Take 40 mg by mouth daily.   carvedilol 12.5 MG tablet Commonly known as:  COREG Take 12.5 mg by mouth 2 (two) times daily.   insulin aspart protamine- aspart (70-30) 100 UNIT/ML injection Commonly known as:  NOVOLOG MIX 70/30 Inject 0.05 mLs (5 Units total) into the skin 2 (two) times daily with a meal.  lisinopril-hydrochlorothiazide 20-12.5 MG tablet Commonly known as:  PRINZIDE,ZESTORETIC Take 2 tablets by mouth daily.        DISCHARGE INSTRUCTIONS:   DIET:  Regular diet and Diabetic diet DISCHARGE CONDITION:  Good ACTIVITY:  Activity as tolerated OXYGEN:  Home Oxygen: No.  Oxygen Delivery: room air DISCHARGE LOCATION:  home   If you experience worsening of your admission symptoms, develop  shortness of breath, life threatening emergency, suicidal or homicidal thoughts you must seek medical attention immediately by calling 911 or calling your MD immediately  if symptoms less severe.  You Must read complete instructions/literature along with all the possible adverse reactions/side effects for all the Medicines you take and that have been prescribed to you. Take any new Medicines after you have completely understood and accpet all the possible adverse reactions/side effects.   Please note  You were cared for by a hospitalist during your hospital stay. If you have any questions about your discharge medications or the care you received while you were in the hospital after you are discharged, you can call the unit and asked to speak with the hospitalist on call if the hospitalist that took care of you is not available. Once you are discharged, your primary care physician will handle any further medical issues. Please note that NO REFILLS for any discharge medications will be authorized once you are discharged, as it is imperative that you return to your primary care physician (or establish a relationship with a primary care physician if you do not have one) for your aftercare needs so that they can reassess your need for medications and monitor your lab values.    On the day of Discharge:  VITAL SIGNS:  Blood pressure 103/60, pulse 73, temperature 98 F (36.7 C), temperature source Oral, resp. rate 15, height 5\' 6"  (1.676 m), weight 83 kg (183 lb), SpO2 100 %. PHYSICAL EXAMINATION:  GENERAL:  55 y.o.-year-old patient lying in the bed with no acute distress.  EYES: Pupils equal, round, reactive to light and accommodation. No scleral icterus. Extraocular muscles intact.  HEENT: Head atraumatic, normocephalic. Oropharynx and nasopharynx clear.  NECK:  Supple, no jugular venous distention. No thyroid enlargement, no tenderness.  LUNGS: Normal breath sounds bilaterally, no wheezing,  rales,rhonchi or crepitation. No use of accessory muscles of respiration.  CARDIOVASCULAR: S1, S2 normal. No murmurs, rubs, or gallops.  ABDOMEN: Soft, non-tender, non-distended. Bowel sounds present. No organomegaly or mass.  EXTREMITIES: No pedal edema, cyanosis, or clubbing.  NEUROLOGIC: Cranial nerves II through XII are intact. Muscle strength 5/5 in all extremities. Sensation intact. Gait not checked.  PSYCHIATRIC: The patient is alert and oriented x 3.  SKIN: No obvious rash, lesion, or ulcer.  DATA REVIEW:   CBC  Recent Labs Lab 08/23/16 0412  WBC 5.8  HGB 11.0*  HCT 33.0*  PLT 210    Chemistries   Recent Labs Lab 08/21/16 2345  08/23/16 0412  NA 139  < > 137  K 4.8  < > 4.8  CL 107  < > 109  CO2 25  < > 25  GLUCOSE 33*  < > 348*  BUN 28*  < > 27*  CREATININE 1.47*  < > 1.39*  CALCIUM 8.6*  < > 8.1*  AST 133*  --   --   ALT 94*  --   --   ALKPHOS 131*  --   --   BILITOT 0.8  --   --   < > = values in this  interval not displayed.    Management plans discussed with the patient, family and they are in agreement.  CODE STATUS: FULL CODE  TOTAL TIME TAKING CARE OF THIS PATIENT: 45minutes.    Delfino LovettVipul Candise Crabtree M.D on 08/24/2016 at 8:29 PM  Between 7am to 6pm - Pager - [phone]  After 6pm go to [email or web address] - Social research officer, governmentpassword EPAS [facility]  [facility] [place] [facility]  Office  [phone]  CC: Primary care physician; Tobey GrimMCCRAVY, MATTHEW S, MD   Note: This dictation was prepared with Dragon dictation along with smaller phrase technology. Any transcriptional errors that result from this process are unintentional.

## 2016-09-26 IMAGING — CT CT HEAD W/O CM
1 series · 16 of 30 positions shown, 20 images · non-contrast
Comparison: 12/06/2014

CLINICAL DATA: Altered mental status.

EXAM:
CT HEAD WITHOUT CONTRAST
TECHNIQUE: Contiguous axial images were obtained from the base of the skull
through the vertex without intravenous contrast.

[Series 2: soft tissue · axial · 0.41mm/px · z∈[+508,+644]mm · 16 of 30 slices shown, 20 images]
[im 2/30  brain]
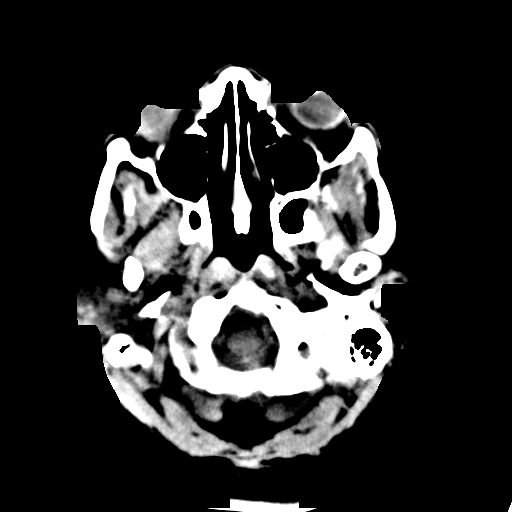
[im 2/30  bone]
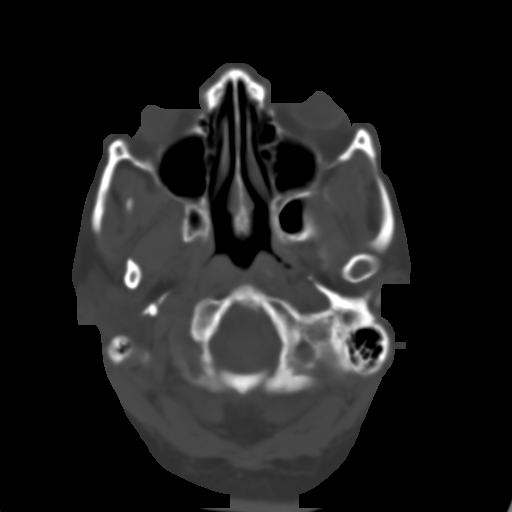
[im 4/30  brain]
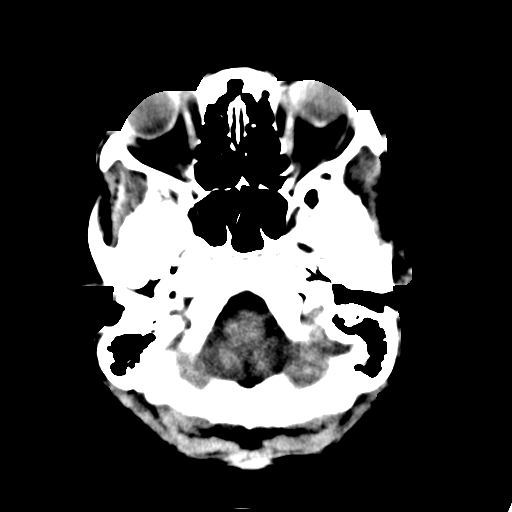
[im 6/30  brain]
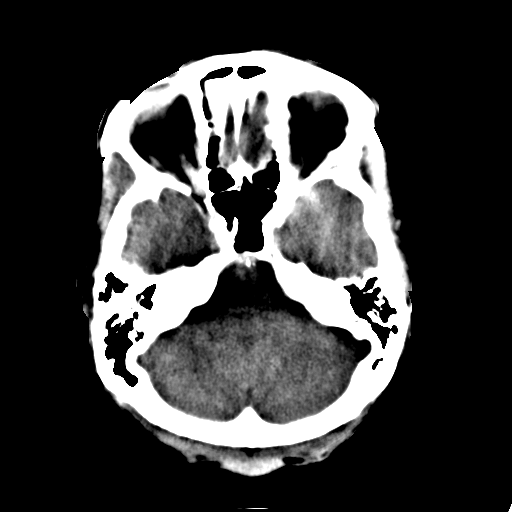
[im 8/30  brain]
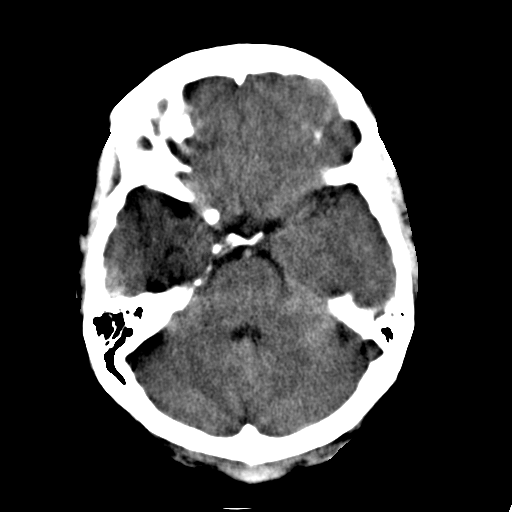
[im 9/30  brain]
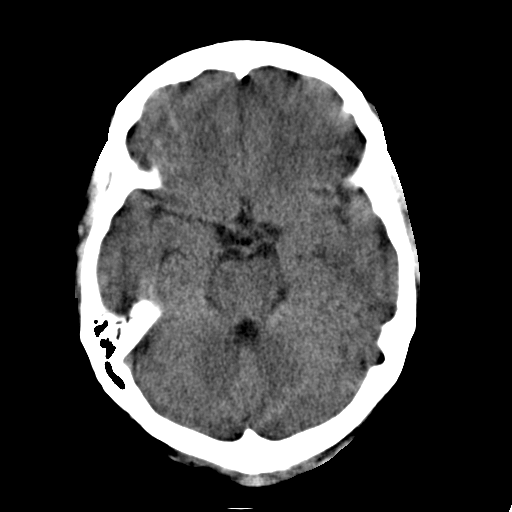
[im 9/30  bone]
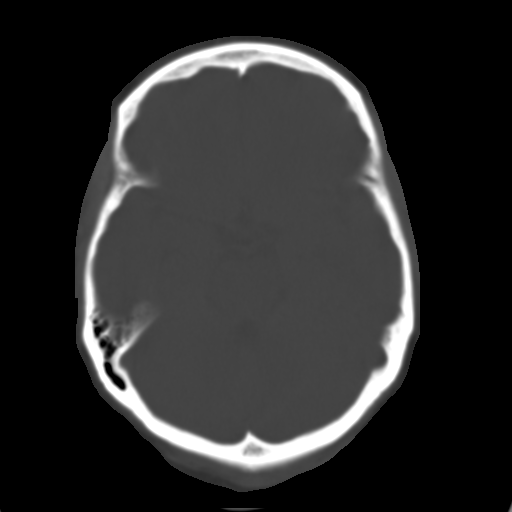
[im 11/30  brain]
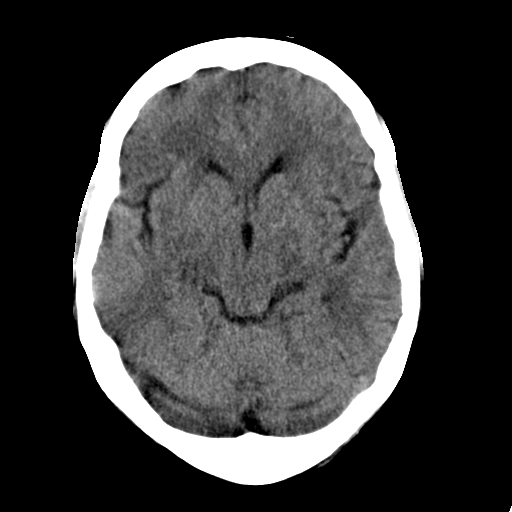
[im 13/30  brain]
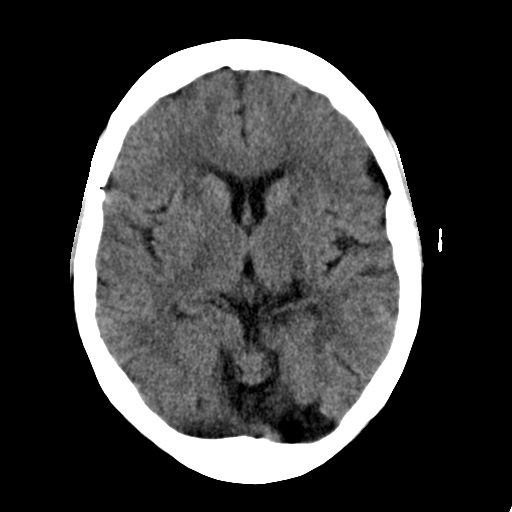
[im 15/30  brain]
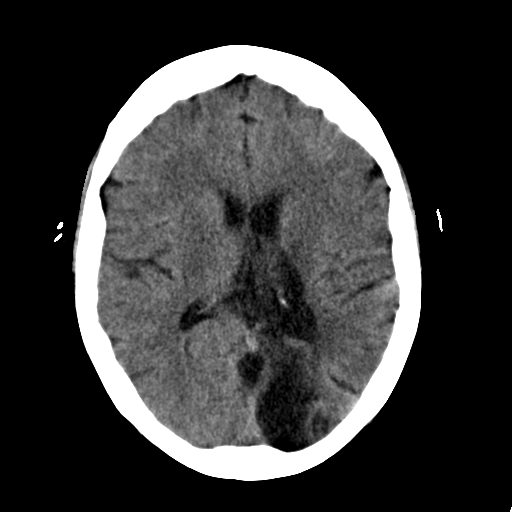
[im 16/30  brain]
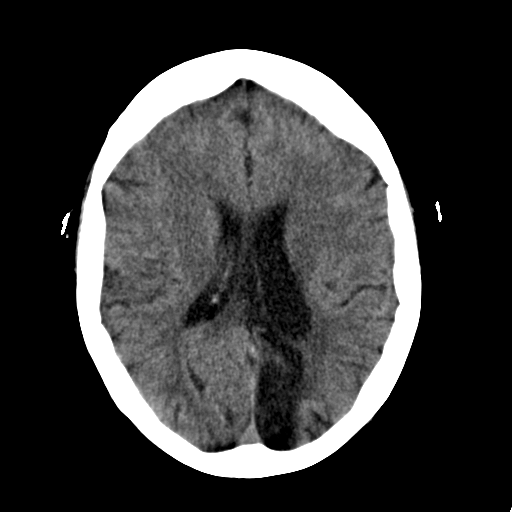
[im 16/30  bone]
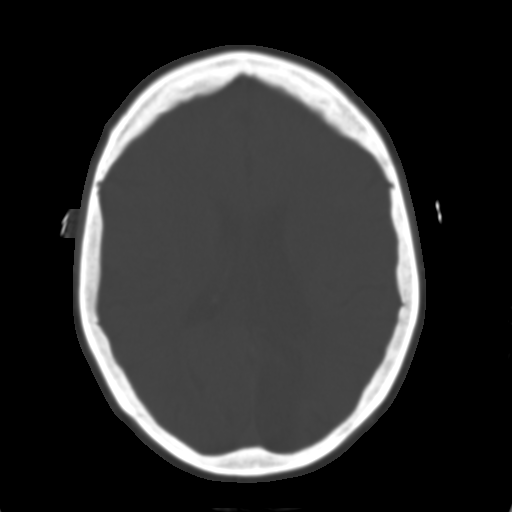
[im 18/30  brain]
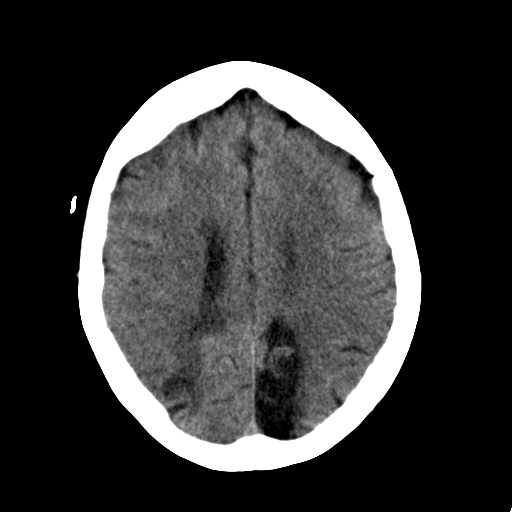
[im 20/30  brain]
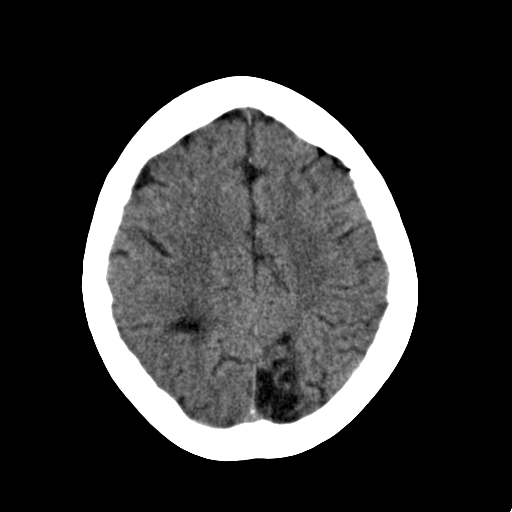
[im 22/30  brain]
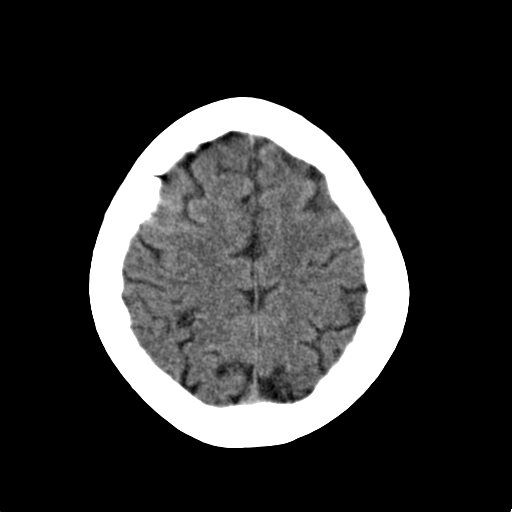
[im 23/30  brain]
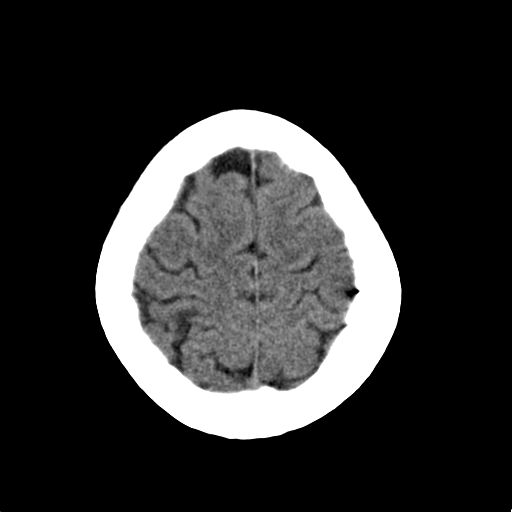
[im 23/30  bone]
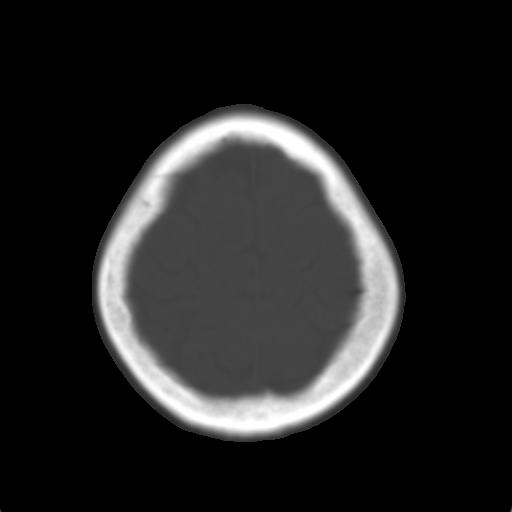
[im 25/30  brain]
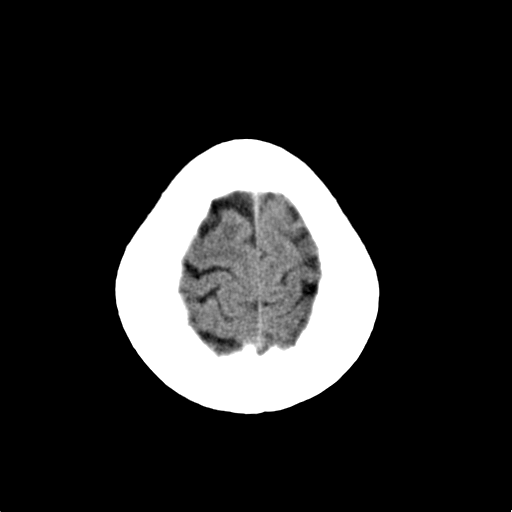
[im 27/30  brain]
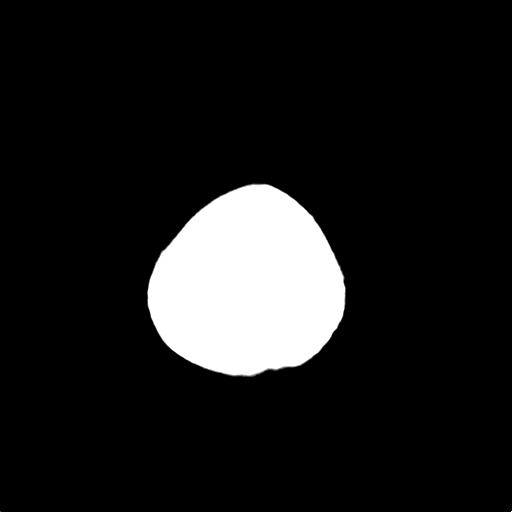
[im 29/30  brain]
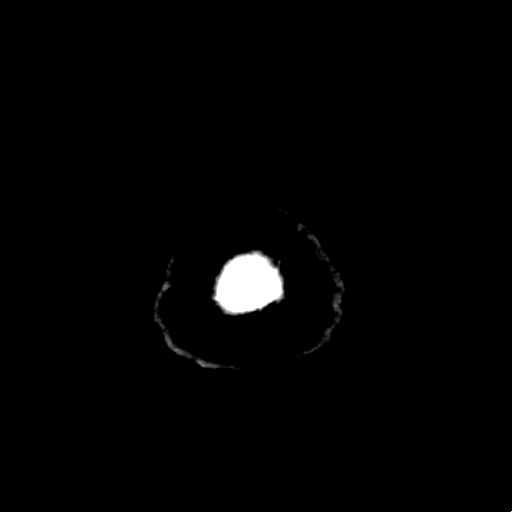

[16 of 30 positions shown; findings below may reference images not displayed]

FINDINGS: Bilateral occipital infarcts, left larger than right. No acute
intracranial abnormality. Specifically, no hemorrhage,
hydrocephalus, mass lesion, acute infarction, or significant
intracranial injury. No acute calvarial abnormality. Visualized
paranasal sinuses and mastoids clear. Orbital soft tissues
unremarkable.
IMPRESSION: No acute intracranial abnormality.

## 2017-05-08 IMAGING — CT CT HEAD W/O CM
1 series · 15 of 30 positions shown, 19 images · non-contrast
Comparison: CT head May 03, 2015

CLINICAL DATA: Hypertensive and hypoglycemic, altered mental
status. History of hypertension, stroke and diabetes.

EXAM:
CT HEAD WITHOUT CONTRAST
TECHNIQUE: Contiguous axial images were obtained from the base of the skull
through the vertex without intravenous contrast.

[Series 2: head wo · axial · 0.47mm/px · z∈[-123,+7]mm · 15 of 30 slices shown, 19 images]
[im 2/30  brain]
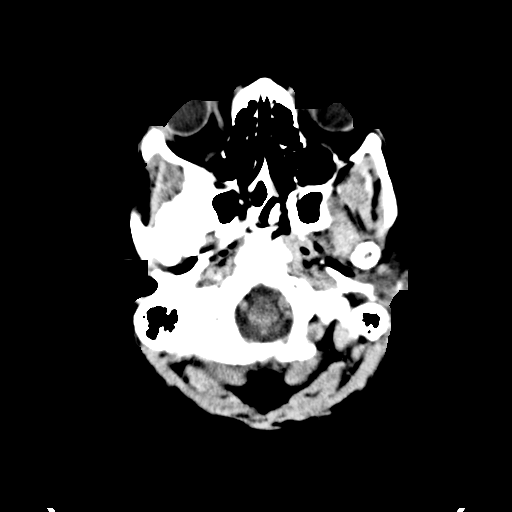
[im 2/30  bone]
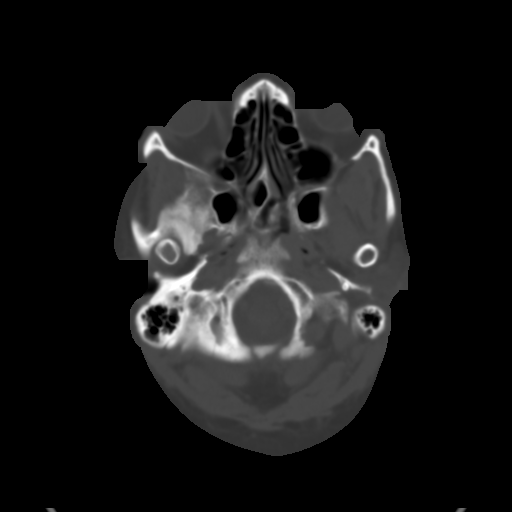
[im 4/30  brain]
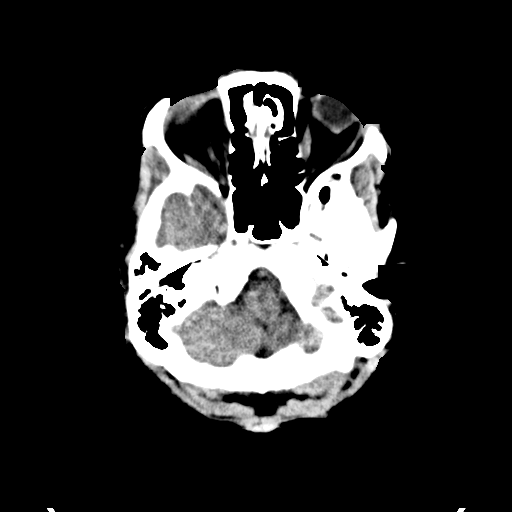
[im 6/30  brain]
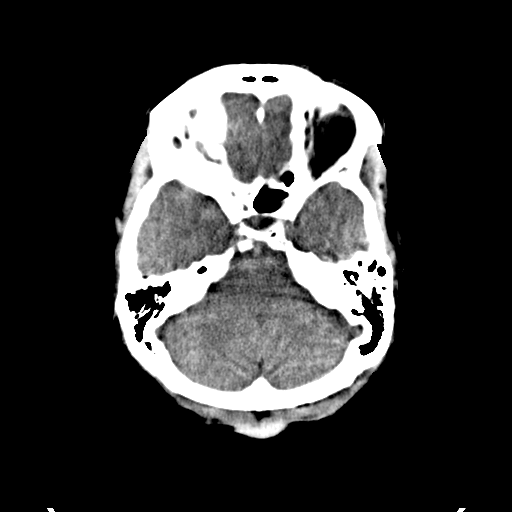
[im 8/30  brain]
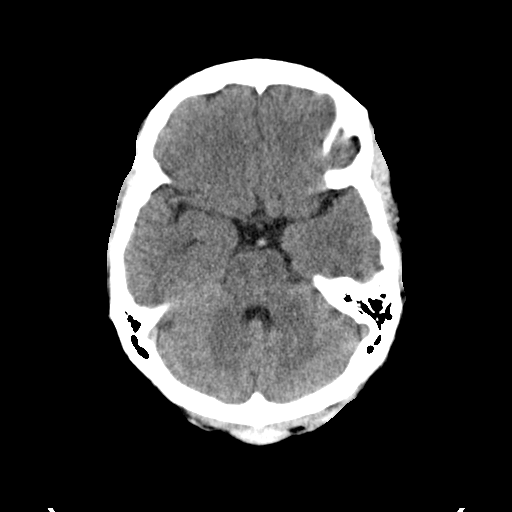
[im 10/30  brain]
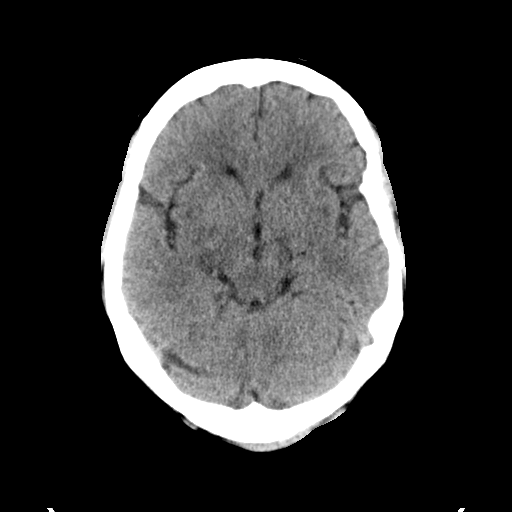
[im 10/30  bone]
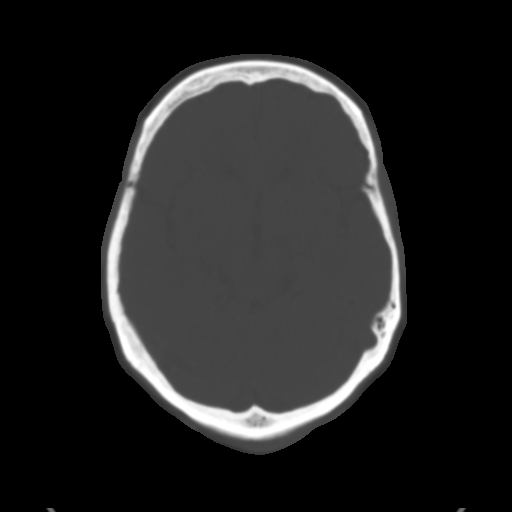
[im 12/30  brain]
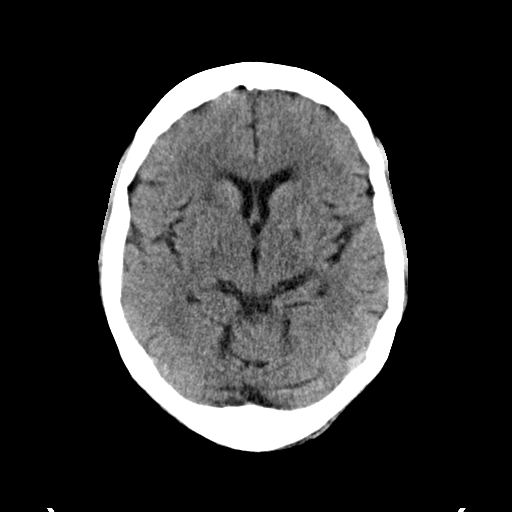
[im 14/30  brain]
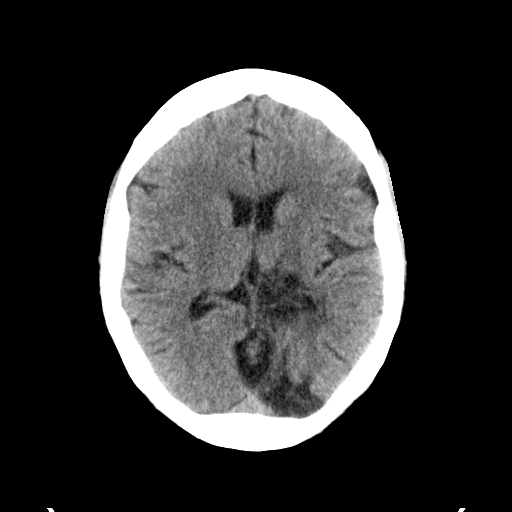
[im 16/30  brain]
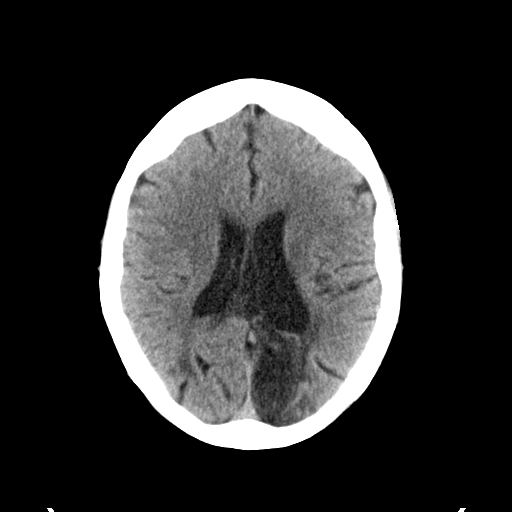
[im 17/30  brain]
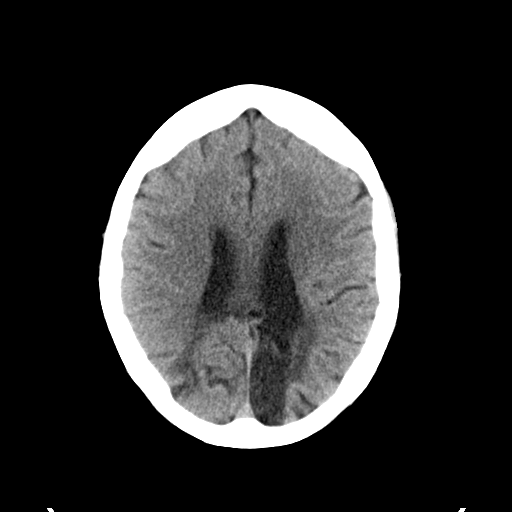
[im 17/30  bone]
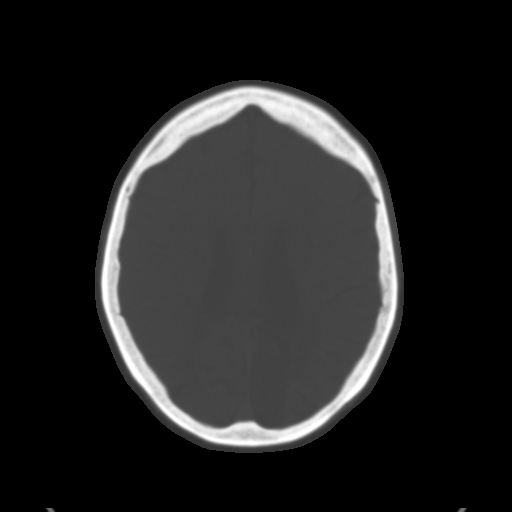
[im 19/30  brain]
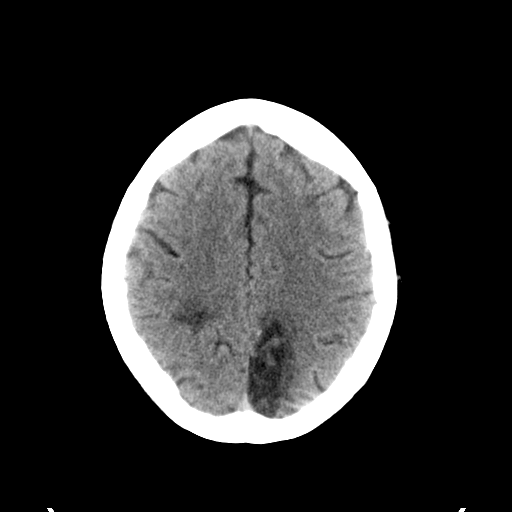
[im 21/30  brain]
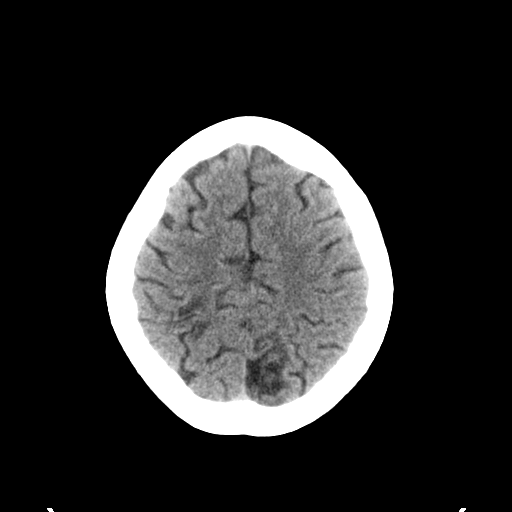
[im 23/30  brain]
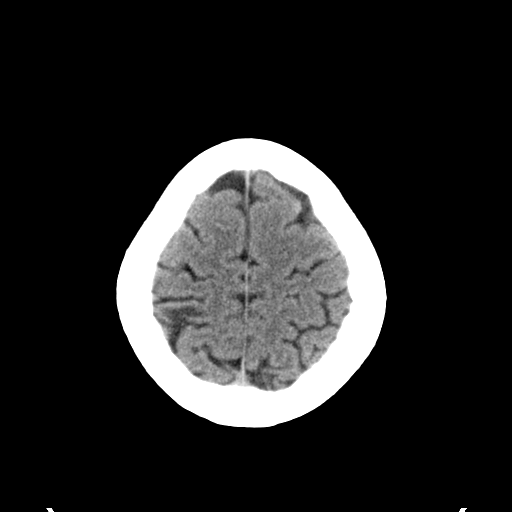
[im 25/30  brain]
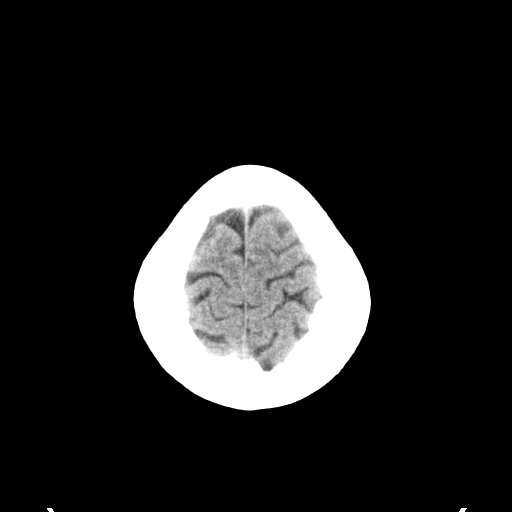
[im 25/30  bone]
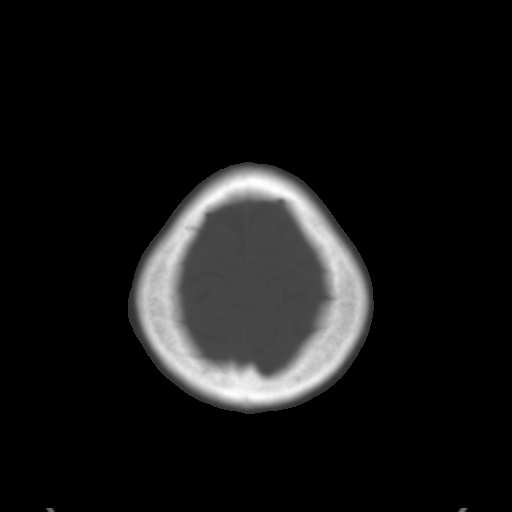
[im 27/30  brain]
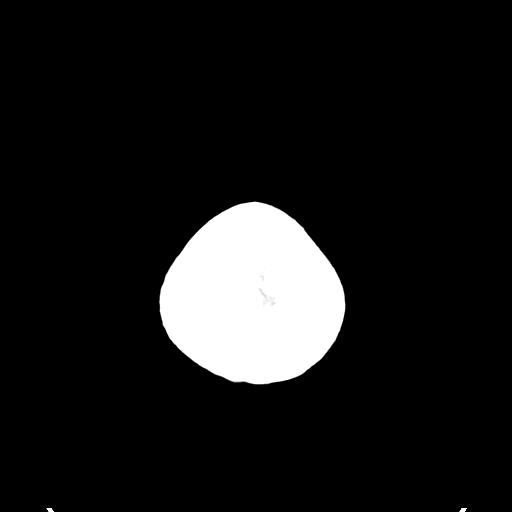
[im 29/30  brain]
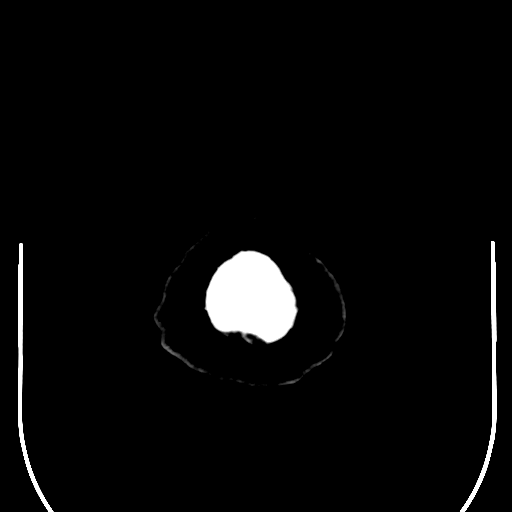

[15 of 30 positions shown; findings below may reference images not displayed]

FINDINGS: INTRACRANIAL CONTENTS: No intraparenchymal hemorrhage, mass effect,
midline shift or acute large vascular territory infarcts. Old LEFT
basal ganglia lacunar infarct. Old LEFT thalamus infarct associated
with LEFT mesial occipital encephalomalacia. Small area RIGHT
parietal infarct. Patchy white matter hypodensities compatible with
mild chronic small vessel ischemic disease. No abnormal extra-axial
fluid collections. Basal cisterns are patent. Minimal calcific
atherosclerosis of the carotid siphons.

ORBITS: The included ocular globes and orbital contents are
non-suspicious.

SINUSES: The mastoid aircells and included paranasal sinuses are
well-aerated.

SKULL/SOFT TISSUES: No skull fracture. No significant soft tissue
swelling.
IMPRESSION: No acute intracranial process.

Chronic changes including old LEFT PCA territory infarct, small area
RIGHT parietal encephalomalacia and LEFT basal ganglia lacunar
infarct.

## 2017-07-17 ENCOUNTER — Emergency Department
Admission: EM | Admit: 2017-07-17 | Discharge: 2017-07-17 | Disposition: A | Discharge status: Home / Self Care | Payer: BLUE CROSS/BLUE SHIELD | Attending: Emergency Medicine | Admitting: Emergency Medicine

## 2017-07-17 ENCOUNTER — Other Ambulatory Visit: Payer: Self-pay

## 2017-07-17 ENCOUNTER — Encounter: Payer: Self-pay | Admitting: Emergency Medicine

## 2017-07-17 DIAGNOSIS — Z8673 Personal history of transient ischemic attack (TIA), and cerebral infarction without residual deficits: Secondary | ICD-10-CM | POA: Insufficient documentation

## 2017-07-17 DIAGNOSIS — E11649 Type 2 diabetes mellitus with hypoglycemia without coma: Secondary | ICD-10-CM | POA: Insufficient documentation

## 2017-07-17 DIAGNOSIS — E162 Hypoglycemia, unspecified: Secondary | ICD-10-CM

## 2017-07-17 DIAGNOSIS — I10 Essential (primary) hypertension: Secondary | ICD-10-CM | POA: Diagnosis not present

## 2017-07-17 DIAGNOSIS — R4182 Altered mental status, unspecified: Secondary | ICD-10-CM | POA: Diagnosis present

## 2017-07-17 DIAGNOSIS — T68XXXA Hypothermia, initial encounter: Secondary | ICD-10-CM

## 2017-07-17 LAB — URINALYSIS, COMPLETE (UACMP) WITH MICROSCOPIC
Bilirubin Urine: NEGATIVE
GLUCOSE, UA: 150 mg/dL — AB
HGB URINE DIPSTICK: NEGATIVE
KETONES UR: NEGATIVE mg/dL
Leukocytes, UA: NEGATIVE
NITRITE: NEGATIVE
PROTEIN: 30 mg/dL — AB
RBC / HPF: NONE SEEN RBC/hpf (ref 0–5)
Specific Gravity, Urine: 1.012 (ref 1.005–1.030)
pH: 5 (ref 5.0–8.0)

## 2017-07-17 LAB — CBC WITH DIFFERENTIAL/PLATELET
BASOS PCT: 0 %
Basophils Absolute: 0 10*3/uL (ref 0–0.1)
EOS ABS: 0.1 10*3/uL (ref 0–0.7)
Eosinophils Relative: 1 %
HCT: 43.1 % (ref 35.0–47.0)
Hemoglobin: 14.2 g/dL (ref 12.0–16.0)
Lymphocytes Relative: 17 %
Lymphs Abs: 1.6 10*3/uL (ref 1.0–3.6)
MCH: 29.9 pg (ref 26.0–34.0)
MCHC: 33.1 g/dL (ref 32.0–36.0)
MCV: 90.3 fL (ref 80.0–100.0)
MONO ABS: 0.7 10*3/uL (ref 0.2–0.9)
MONOS PCT: 8 %
Neutro Abs: 6.8 10*3/uL — ABNORMAL HIGH (ref 1.4–6.5)
Neutrophils Relative %: 74 %
Platelets: 214 10*3/uL (ref 150–440)
RBC: 4.77 MIL/uL (ref 3.80–5.20)
RDW: 15 % — AB (ref 11.5–14.5)
WBC: 9.2 10*3/uL (ref 3.6–11.0)

## 2017-07-17 LAB — GLUCOSE, CAPILLARY
GLUCOSE-CAPILLARY: 112 mg/dL — AB (ref 65–99)
Glucose-Capillary: 154 mg/dL — ABNORMAL HIGH (ref 65–99)
Glucose-Capillary: 113 mg/dL — ABNORMAL HIGH (ref 65–99)

## 2017-07-17 LAB — COMPREHENSIVE METABOLIC PANEL
ALT: 26 U/L (ref 14–54)
ANION GAP: 7 (ref 5–15)
AST: 46 U/L — ABNORMAL HIGH (ref 15–41)
Albumin: 3 g/dL — ABNORMAL LOW (ref 3.5–5.0)
Alkaline Phosphatase: 103 U/L (ref 38–126)
BUN: 30 mg/dL — ABNORMAL HIGH (ref 6–20)
CHLORIDE: 107 mmol/L (ref 101–111)
CO2: 24 mmol/L (ref 22–32)
CREATININE: 1.29 mg/dL — AB (ref 0.44–1.00)
Calcium: 8.2 mg/dL — ABNORMAL LOW (ref 8.9–10.3)
GFR, EST AFRICAN AMERICAN: 52 mL/min — AB (ref >60)
GFR, EST NON AFRICAN AMERICAN: 45 mL/min — AB (ref >60)
Glucose, Bld: 119 mg/dL — ABNORMAL HIGH (ref 65–99)
POTASSIUM: 3.6 mmol/L (ref 3.5–5.1)
SODIUM: 138 mmol/L (ref 135–145)
Total Bilirubin: 0.8 mg/dL (ref 0.3–1.2)
Total Protein: 7.5 g/dL (ref 6.5–8.1)

## 2017-07-17 LAB — TROPONIN I

## 2017-07-17 MED ORDER — ATORVASTATIN CALCIUM 40 MG PO TABS
40.00 mg | ORAL_TABLET | ORAL | Status: DC
Start: 2017-07-16 — End: 2017-07-17

## 2017-07-17 MED ORDER — ASPIRIN 81 MG PO CHEW
81.00 mg | CHEWABLE_TABLET | ORAL | Status: DC
Start: 2017-07-17 — End: 2017-07-17

## 2017-07-17 MED ORDER — INSULIN LISPRO 100 UNIT/ML ~~LOC~~ SOLN
0.00 | SUBCUTANEOUS | Status: DC
Start: 2017-07-16 — End: 2017-07-17

## 2017-07-17 MED ORDER — GENERIC EXTERNAL MEDICATION
Status: DC
Start: 2017-07-17 — End: 2017-07-17

## 2017-07-17 MED ORDER — ACETAMINOPHEN 325 MG PO TABS
650.00 mg | ORAL_TABLET | ORAL | Status: DC
Start: ? — End: 2017-07-17

## 2017-07-17 MED ORDER — CARVEDILOL 6.25 MG PO TABS
25.00 mg | ORAL_TABLET | ORAL | Status: DC
Start: 2017-07-16 — End: 2017-07-17

## 2017-07-17 MED ORDER — INSULIN NPH ISOPHANE & REGULAR (70-30) 100 UNIT/ML ~~LOC~~ SUSP
12.00 | SUBCUTANEOUS | Status: DC
Start: 2017-07-17 — End: 2017-07-17

## 2017-07-17 MED ORDER — NITROFURANTOIN MONOHYD MACRO 100 MG PO CAPS
100.00 mg | ORAL_CAPSULE | ORAL | Status: DC
Start: 2017-07-16 — End: 2017-07-17

## 2017-07-17 MED ORDER — DEXTROSE 50 % IV SOLN
12.50 | INTRAVENOUS | Status: DC
Start: ? — End: 2017-07-17

## 2017-07-17 MED ORDER — BUTALBITAL-APAP-CAFFEINE 50-325-40 MG PO TABS
1.00 | ORAL_TABLET | ORAL | Status: DC
Start: ? — End: 2017-07-17

## 2017-07-17 MED ORDER — INSULIN NPH ISOPHANE & REGULAR (70-30) 100 UNIT/ML ~~LOC~~ SUSP
6.00 | SUBCUTANEOUS | Status: DC
Start: ? — End: 2017-07-17

## 2017-07-17 NOTE — ED Triage Notes (Signed)
Pt arrived via ems from home after family found pt unresponsive. EMS arrived and found pt unresonsive with a blood sugar of 40; pt was given 1 amp of dextrose and ems reports pt became more responsive. Upon arrival to the ED pt's blood sugar 154 and pt alert but is till non verbal. Pt's family last reported seeing pt eat at 1630 yesterday.No family at bedside currently.

## 2017-07-17 NOTE — ED Notes (Signed)
Patient given phone to call husband.

## 2017-07-17 NOTE — Care Management (Signed)
Patient has discharged to home from ED at time of this documentation. [facility] will accept this patient. I have contacted patient's home number without success. I have left message on patient's husband's cell phone [phone] to update on this and PCP follow up appointment. I also left message that his home VM was full and that home health would be calling to make arrangements to come out.

## 2017-07-17 NOTE — ED Notes (Signed)
Patient taken to lobby in wheelchair. Steady gait noted when getting in car.

## 2017-07-17 NOTE — ED Notes (Addendum)
Pt reports she is a diabetic and needs crackers and a banana. RN took pt a sandwich tray, crackers and peanut butter as well as a banana that was frozen in the refrigerator. RN explained to pt that the banana was frozen but presented other meal options. Pt refused and stated, "No, I want a banana." RN called dietary.

## 2017-07-17 NOTE — ED Provider Notes (Signed)
Patient is been evaluated at length and there were lengthy discussions with the family over best plan of care for her.  Family states they want to take her home.  I cannot ensure her safety and Adult Protective Services has been involved.  Her temperature and blood sugar have been normalized.   [name] Gilbert, Ashante Yellin E, MD 07/17/17 [phone]

## 2017-07-17 NOTE — ED Provider Notes (Signed)
[facility] Emergency Department Provider Note       Time seen: ----------------------------------------- 7:01 AM on 07/17/2017 -----------------------------------------  Level V caveat: History/ROS limited by altered mental status  I have reviewed the triage vital signs and the nursing notes.  HISTORY   Chief Complaint No chief complaint on file.    HPI [name] Gilbert is a 56 y.o. female with a history of diabetes who presents to the ED for altered mental status.  Patient arrives via EMS from home after the family found her unresponsive.  EMS arrived and found she was unresponsive with a blood sugar around 40.  She was given an amp of dextrose after which she was more responsive.  Blood sugar was at 154 on arrival but she arrives confused but alert.  Family last reported seeing her eat around 4:30 yesterday.  Past Medical History:  Diagnosis Date  . Diabetes mellitus without complication (HCC)   . Hypertension   . Stroke (HCC)   . visual impaired     Patient Active Problem List   Diagnosis Date Noted  . 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  . 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 Hypoglycemia 01/26/2016    Past Surgical History:  Procedure Laterality Date  . ABDOMINAL HYSTERECTOMY    . TONSILLECTOMY      Allergies Penicillins; Codeine; Metformin; and Statins  Social History Social History   Tobacco Use  . Smoking status: Never Smoker  . Smokeless tobacco: Never Used  Substance Use Topics  . Alcohol use: No  . Drug use: No    Review of Systems Unknown, patient is confused at this time  All systems negative/normal/unremarkable except as stated in the HPI  ____________________________________________   PHYSICAL EXAM:  VITAL SIGNS: ED Triage Vitals  Enc Vitals Group     BP      Pulse      Resp      Temp       Temp src      SpO2      Weight      Height      Head Circumference      Peak Flow      Pain Score      Pain Loc      Pain Edu?      Excl. in GC?     Constitutional: Alert but disoriented, mild distress  Eyes: Conjunctivae are normal. Normal extraocular movements. ENT   Head: Normocephalic and atraumatic.   Nose: No congestion/rhinnorhea.   Mouth/Throat: Mucous membranes are moist.   Neck: No stridor. Cardiovascular: Normal rate, regular rhythm. No murmurs, rubs, or gallops. Respiratory: Normal respiratory effort without tachypnea nor retractions. Breath sounds are clear and equal bilaterally. No wheezes/rales/rhonchi. Gastrointestinal: Soft and nontender. Normal bowel sounds Musculoskeletal: Nontender with normal range of motion in extremities. No lower extremity tenderness nor edema. Neurologic: Patient is confused and not following commands well.  She appears to be moving her extremities well. Skin:  Skin is cool to touch but dry and intact Psychiatric: Mood and affect are normal. Speech and behavior are normal.  ____________________________________________  EKG: Interpreted by me.  Sinus rhythm rate of 61 bpm, normal PR interval, normal QRS, normal QT.  No Earl GalaOsborne wave is present  ____________________________________________  ED COURSE:  Pertinent labs & imaging results that were available during my care of the patient were reviewed by me and considered in my medical  decision making (see chart for details). Patient presents for altered mental status and recent hypoglycemia, we will assess with labs and imaging as indicated.   Procedures ____________________________________________   LABS (pertinent positives/negatives)  Labs Reviewed  GLUCOSE, CAPILLARY - Abnormal; Notable for the following components:      Result Value   Glucose-Capillary 154 (*)    All other components within normal limits  CBC WITH DIFFERENTIAL/PLATELET - Abnormal; Notable for the  following components:   RDW 15.0 (*)    Neutro Abs 6.8 (*)    All other components within normal limits  COMPREHENSIVE METABOLIC PANEL - Abnormal; Notable for the following components:   Glucose, Bld 119 (*)    BUN 30 (*)    Creatinine, Ser 1.29 (*)    Calcium 8.2 (*)    Albumin 3.0 (*)    AST 46 (*)    GFR calc non Af Amer 45 (*)    GFR calc Af Amer 52 (*)    All other components within normal limits  GLUCOSE, CAPILLARY - Abnormal; Notable for the following components:   Glucose-Capillary 112 (*)    All other components within normal limits  TROPONIN I  URINALYSIS, COMPLETE (UACMP) WITH MICROSCOPIC  CBG MONITORING, ED   CRITICAL CARE Performed by: Emily FilbertWilliams, Radin Raptis E   Total critical care time: 30 minutes  Critical care time was exclusive of separately billable procedures and treating other patients.  Critical care was necessary to treat or prevent imminent or life-threatening deterioration.  Critical care was time spent personally by me on the following activities: development of treatment plan with patient and/or surrogate as well as nursing, discussions with consultants, evaluation of patient's response to treatment, examination of patient, obtaining history from patient or surrogate, ordering and performing treatments and interventions, ordering and review of laboratory studies, ordering and review of radiographic studies, pulse oximetry and re-evaluation of patient's condition. ____________________________________________  DIFFERENTIAL DIAGNOSIS   Hypoglycemia, hypothermia, electrolyte abnormality, renal failure, dehydration  FINAL ASSESSMENT AND PLAN  Hypoglycemia, hypothermia   Plan: Patient had presented for altered mental status likely secondary to prolonged hypoglycemia resulting in hypothermia.  She was placed under a bear hugger blanket and started on warmed fluids.  Patient's labs are grossly unremarkable.  Given the fact that she was just hospitalized for  this there [facility] in a rehab facility.  Family at that time want to take her home which has now resulted in prolonged hypoglycemia with resulting hypothermia.  We will consult social work for skilled rehab placement.   Emily FilbertWilliams, Jhada Risk E, MD   Note: This note was generated in part or whole with voice recognition software. Voice recognition is usually quite accurate but there are transcription errors that can and very often do occur. I apologize for any typographical errors that were not detected and corrected.     Emily FilbertWilliams, Ronte Parker E, MD 07/17/17 1007

## 2017-07-17 NOTE — ED Notes (Signed)
Patient ambulated to restroom with no assistance. Steady gait noted. Patient repositioned in bed and hooked to cardiac monitor.

## 2017-07-17 NOTE — Care Management (Addendum)
Current home health referral pending to [phone] [facility].  RNCM met with patient to discuss home health. She states she is not aware of home health agency [facility] arranged "because I haven't been home long enough for them to start". "I'm a very private person and I don't want them all in my business".  I explained what the [facility] will do: make adjustments to diabetes medications with her [facility] PCP. She is currently agreeing with [facility] I have added HHSW. Patient states she has a working glucometer and checks her sugars sometimes 3 times per day. Patient does not want her husband involved.  She states she is independent with mobility. She does not drive and her mother stays with her 24/7.  Husband works third shift at [employer]. He provides health insurance. She states she is "safe at home and denies abuse".  RNCM now working to find a home health agency that will accept her private BCBS. I have sent referral to [facility] fax [phone] since patient has [facility] provider. Status pending; ED MD updated.

## 2017-07-17 NOTE — ED Notes (Signed)
Pt given banana.

## 2017-07-17 NOTE — Care Management (Addendum)
Patient agrees to follow up appointment any day after 1PM with Dr. Penni BombardMcCravy at [facility]: [phone]. She has an appointment already in place for Monday Nov. 12 at 3:20PM.  Dr. Penni BombardMcCravy will call this RNCM back regarding [facility] with [facility] or any home health agency if possible. [facility] for [facility]- still pending their response. I have reached back out to [facility] to follow up on fax for home health orders- no answer/actually lost call. RNCM called back and spoke with central intake at [facility].  I spoke with Estella at [phone] at [facility]. She states she has a previous referral for home health on record also. I have managed to re-fax new homehealth orders to Estella's attention. She will call me back with update.
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
## 2017-07-17 NOTE — Care Management (Addendum)
RNCM received callback [facility] Ernestine stating that [facility]. [facility]. [facility] patient yesterday at time of referral. I called Ernestine back explaining that this patient was followed by [facility] and the [facility] this [facility]. Ernestine will contact [facility] "as it is documented that [facility] yesterday". [facility] Rebecca [phone] had accepted patient yesterday for home health- Lurena JoinerRebecca will [facility] and call this RNCM back with reply.

## 2017-07-18 NOTE — Care Management (Signed)
This RNCM received callback from [facility]CP Dr[facility] Penni BombardMcCravy this AM. He has been updated on [facility]'s [facility] ED and DM concerns. Per Dr. Penni BombardMcCravy, patient is on an insulin that may be contributing to hypoglycemia however "its the only one her insurance will cover".  Patient denied problems with obtaining her medication during my assessment of discharge needs. [facility] [place]  She may not be aware that PCP had considered a different insulin. I also received a voice mail from patient's husband letting me know that he did receive my message and is aware of follow up appointment with Dr. Penni BombardMcCravy and that [facility] with patient.

## 2017-10-28 ENCOUNTER — Emergency Department
Admission: EM | Admit: 2017-10-28 | Discharge: 2017-10-28 | Disposition: A | Discharge status: Home / Self Care | Payer: BLUE CROSS/BLUE SHIELD | Attending: Emergency Medicine | Admitting: Emergency Medicine

## 2017-10-28 ENCOUNTER — Emergency Department: Payer: BLUE CROSS/BLUE SHIELD

## 2017-10-28 DIAGNOSIS — N390 Urinary tract infection, site not specified: Secondary | ICD-10-CM | POA: Diagnosis not present

## 2017-10-28 DIAGNOSIS — E162 Hypoglycemia, unspecified: Secondary | ICD-10-CM

## 2017-10-28 DIAGNOSIS — Z8673 Personal history of transient ischemic attack (TIA), and cerebral infarction without residual deficits: Secondary | ICD-10-CM | POA: Diagnosis not present

## 2017-10-28 DIAGNOSIS — Z794 Long term (current) use of insulin: Secondary | ICD-10-CM | POA: Insufficient documentation

## 2017-10-28 DIAGNOSIS — I10 Essential (primary) hypertension: Secondary | ICD-10-CM | POA: Insufficient documentation

## 2017-10-28 DIAGNOSIS — E11649 Type 2 diabetes mellitus with hypoglycemia without coma: Secondary | ICD-10-CM | POA: Insufficient documentation

## 2017-10-28 LAB — COMPREHENSIVE METABOLIC PANEL
ALBUMIN: 4.3 g/dL (ref 3.5–5.0)
ALK PHOS: 128 U/L — AB (ref 38–126)
ALT: 49 U/L (ref 14–54)
AST: 86 U/L — ABNORMAL HIGH (ref 15–41)
Anion gap: 7 (ref 5–15)
BUN: 31 mg/dL — ABNORMAL HIGH (ref 6–20)
CALCIUM: 8.9 mg/dL (ref 8.9–10.3)
CO2: 27 mmol/L (ref 22–32)
CREATININE: 1.32 mg/dL — AB (ref 0.44–1.00)
Chloride: 104 mmol/L (ref 101–111)
GFR calc Af Amer: 50 mL/min — ABNORMAL LOW (ref >60)
GFR calc non Af Amer: 44 mL/min — ABNORMAL LOW (ref >60)
GLUCOSE: 198 mg/dL — AB (ref 65–99)
Potassium: 4.2 mmol/L (ref 3.5–5.1)
SODIUM: 138 mmol/L (ref 135–145)
Total Bilirubin: 0.8 mg/dL (ref 0.3–1.2)
Total Protein: 10 g/dL — ABNORMAL HIGH (ref 6.5–8.1)

## 2017-10-28 LAB — CBC WITH DIFFERENTIAL/PLATELET
BASOS PCT: 1 %
Basophils Absolute: 0 10*3/uL (ref 0–0.1)
EOS ABS: 0 10*3/uL (ref 0–0.7)
Eosinophils Relative: 1 %
HCT: 43.2 % (ref 35.0–47.0)
HEMOGLOBIN: 14.3 g/dL (ref 12.0–16.0)
Lymphocytes Relative: 20 %
Lymphs Abs: 1.5 10*3/uL (ref 1.0–3.6)
MCH: 29.8 pg (ref 26.0–34.0)
MCHC: 33.1 g/dL (ref 32.0–36.0)
MCV: 89.9 fL (ref 80.0–100.0)
Monocytes Absolute: 0.4 10*3/uL (ref 0.2–0.9)
Monocytes Relative: 6 %
NEUTROS PCT: 74 %
Neutro Abs: 5.6 10*3/uL (ref 1.4–6.5)
Platelets: 291 10*3/uL (ref 150–440)
RBC: 4.8 MIL/uL (ref 3.80–5.20)
RDW: 15 % — ABNORMAL HIGH (ref 11.5–14.5)
WBC: 7.6 10*3/uL (ref 3.6–11.0)

## 2017-10-28 LAB — ETHANOL: Alcohol, Ethyl (B): <10 mg/dL (ref <10)

## 2017-10-28 LAB — URINALYSIS, COMPLETE (UACMP) WITH MICROSCOPIC
BILIRUBIN URINE: NEGATIVE
GLUCOSE, UA: NEGATIVE mg/dL
KETONES UR: NEGATIVE mg/dL
Nitrite: NEGATIVE
PROTEIN: 30 mg/dL — AB
Specific Gravity, Urine: 1.008 (ref 1.005–1.030)
pH: 6 (ref 5.0–8.0)

## 2017-10-28 LAB — GLUCOSE, CAPILLARY
GLUCOSE-CAPILLARY: 112 mg/dL — AB (ref 65–99)
Glucose-Capillary: 146 mg/dL — ABNORMAL HIGH (ref 65–99)
Glucose-Capillary: 132 mg/dL — ABNORMAL HIGH (ref 65–99)
Glucose-Capillary: 112 mg/dL — ABNORMAL HIGH (ref 65–99)

## 2017-10-28 LAB — TROPONIN I: Troponin I: <0.03 ng/mL (ref <0.03)

## 2017-10-28 MED ORDER — DEXTROSE 50 % IV SOLN
INTRAVENOUS | Status: AC
  Filled 2017-10-28: qty 50

## 2017-10-28 MED ORDER — CIPROFLOXACIN IN D5W 400 MG/200ML IV SOLN
400.0000 mg | Freq: Once | INTRAVENOUS | Status: AC
  Administered 2017-10-28: 400 mg via INTRAVENOUS
  Filled 2017-10-28: qty 200

## 2017-10-28 MED ORDER — CIPROFLOXACIN HCL 500 MG PO TABS: 500.0000 mg | ORAL_TABLET | Freq: Two times a day (BID) | ORAL | 0 refills | Status: DC

## 2017-10-28 NOTE — ED Provider Notes (Signed)
Patient accepted from Dr. Dolores FrameSung.  I spoke with the patient again about her medication dosing.  She has continued to be on Victoza, and she is taking her insulin midday.  I discussed with her again with her husband and she is very clear not to stop the Victoza and move her insulin 7030 to the morning.  [facility], I have asked her to call for an appointment tomorrow.   Governor RooksLord, Tranice Laduke, MD 10/28/17 [phone]

## 2017-10-28 NOTE — Discharge Instructions (Signed)
1.  Here are your doctor's instructions regarding your blood sugar medicines:  - Give 12 units NPH 70/30 insulin with breakfast; do not give nightly dose  - Do not take Victoza 2. Take antibiotic as prescribed (Cipro 500mg  twice daily for 7 days). 3.  Return to the ER for recurrent or worsening symptoms, persistent vomiting, difficulty breathing or other concerns.

## 2017-10-28 NOTE — ED Notes (Signed)
Report received, pt awake and alert, only c/o feeling very tired, denies pain or discomfort at this time, IV Cipro infusing

## 2017-10-28 NOTE — ED Notes (Signed)
Juice and crackers given, repeat blood sugar 112

## 2017-10-28 NOTE — ED Triage Notes (Signed)
Pt presents today via [facility] from home. Pt husband called for pt being very lethargic. [facility] reports CBG of 39 and gave 1g of glucagon and 25 ml of D5. Pt is lethargic but arousal by stimuli. Pt takes 70/30 Humalog and Victozia for DM2. Pt was seen by PCP this week for unstable glucose. Pt presents with a foul smelling urine. EDP at bedside.

## 2017-10-28 NOTE — ED Provider Notes (Signed)
[facility] Emergency Department Provider Note   ____________________________________________   First MD Initiated Contact with Patient 10/28/17 0250     (approximate)  I have reviewed the triage vital signs and the nursing notes.   HISTORY  Chief Complaint Hypoglycemia  Level 5 caveat: Limited by decreased LOC  HPI [name] Gilbert is a 57 y.o. female brought to the ED from home via EMS with a chief complaint of hypoglycemia.  Patient is an insulin-dependent diabetic with bouts of recurrent hypoglycemia.  Patient's husband called EMS for patient sweating and shaking.  Blood sugar at home was 39.  EMS administered 1 g and one half dose of D50 with blood sugar 41.  Patient denies accidentally or intentionally taking more insulin as prescribed.  Denies recent fever, chills, chest pain, shortness of breath, abdominal pain, nausea, vomiting, dysuria, diarrhea.  Denies recent travel or trauma.   Past Medical History:  Diagnosis Date  . Diabetes mellitus without complication (HCC)   . Hypertension   . Stroke (HCC)   . visual impaired     Patient Active Problem List   Diagnosis Date Noted  . 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  . 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 Hypoglycemia 01/26/2016    Past Surgical History:  Procedure Laterality Date  . ABDOMINAL HYSTERECTOMY    . TONSILLECTOMY      Prior to Admission medications   Medication Sig Start Date End Date Taking? Authorizing Provider  amLODipine (NORVASC) 2.5 MG tablet Take 5 mg by mouth daily.     [provider]  atorvastatin (LIPITOR) 40 MG tablet Take 40 mg by mouth daily.    [provider]  carvedilol (COREG) 12.5 MG tablet Take 12.5 mg by mouth 2 (two) times daily.     [provider]  insulin aspart protamine- aspart (NOVOLOG MIX 70/30) (70-30)  100 UNIT/ML injection Inject 0.05 mLs (5 Units total) into the skin 2 (two) times daily with a meal. 08/23/16   Delfino Lovett, MD  lisinopril-hydrochlorothiazide (PRINZIDE,ZESTORETIC) 20-12.5 MG per tablet Take 2 tablets by mouth daily.    [provider]    Allergies Penicillins; Codeine; Metformin; and Statins  Family History  Problem Relation Age of Onset  . Diabetes Father     Social History Social History   Tobacco Use  . Smoking status: Never Smoker  . Smokeless tobacco: Never Used  Substance Use Topics  . Alcohol use: No  . Drug use: No    Review of Systems  Constitutional: Positive for sweating and shaking.  No fever/chills. Eyes: No visual changes. ENT: No sore throat. Cardiovascular: Denies chest pain. Respiratory: Denies shortness of breath. Gastrointestinal: No abdominal pain.  No nausea, no vomiting.  No diarrhea.  No constipation. Genitourinary: Negative for dysuria. Musculoskeletal: Negative for back pain. Skin: Negative for rash. Neurological: Negative for headaches, focal weakness or numbness.   ____________________________________________   PHYSICAL EXAM:  VITAL SIGNS: ED Triage Vitals  Enc Vitals Group     BP      Pulse      Resp      Temp      Temp src      SpO2      Weight      Height      Head Circumference      Peak Flow      Pain Score      Pain Loc  Pain Edu?      Excl. in GC?     Constitutional: Alert and oriented.  Tired appearing and in no acute distress. Eyes: Conjunctivae are normal. PERRL. EOMI. Head: Atraumatic. Nose: No congestion/rhinnorhea. Mouth/Throat: Mucous membranes are moist.  Oropharynx non-erythematous. Neck: No stridor.  Supple neck without meningismus. Cardiovascular: Normal rate, regular rhythm. Grossly normal heart sounds.  Good peripheral circulation. Respiratory: Normal respiratory effort.  No retractions. Lungs CTAB. Gastrointestinal: Soft and nontender. No distention. No abdominal  bruits. No CVA tenderness. Musculoskeletal: No lower extremity tenderness nor edema.  No joint effusions. Neurologic:  Normal speech and language. No gross focal neurologic deficits are appreciated.  Skin:  Skin is warm, dry and intact. No rash noted. Psychiatric: Mood and affect are normal. Speech and behavior are normal.  ____________________________________________   LABS (all labs ordered are listed, but only abnormal results are displayed)  Labs Reviewed  CBC WITH DIFFERENTIAL/PLATELET - Abnormal; Notable for the following components:      Result Value   RDW 15.0 (*)    All other components within normal limits  COMPREHENSIVE METABOLIC PANEL - Abnormal; Notable for the following components:   Glucose, Bld 198 (*)    BUN 31 (*)    Creatinine, Ser 1.32 (*)    Total Protein 10.0 (*)    AST 86 (*)    Alkaline Phosphatase 128 (*)    GFR calc non Af Amer 44 (*)    GFR calc Af Amer 50 (*)    All other components within normal limits  URINALYSIS, COMPLETE (UACMP) WITH MICROSCOPIC - Abnormal; Notable for the following components:   Color, Urine YELLOW (*)    APPearance HAZY (*)    Hgb urine dipstick SMALL (*)    Protein, ur 30 (*)    Leukocytes, UA SMALL (*)    Bacteria, UA FEW (*)    Squamous Epithelial / LPF 0-5 (*)    All other components within normal limits  GLUCOSE, CAPILLARY - Abnormal; Notable for the following components:   Glucose-Capillary 146 (*)    All other components within normal limits  GLUCOSE, CAPILLARY - Abnormal; Notable for the following components:   Glucose-Capillary 132 (*)    All other components within normal limits  GLUCOSE, CAPILLARY - Abnormal; Notable for the following components:   Glucose-Capillary 112 (*)    All other components within normal limits  URINE CULTURE  ETHANOL  TROPONIN I   ____________________________________________  EKG  ED ECG REPORT I, Percy Winterrowd J, the attending physician, personally viewed and interpreted this  ECG.   Date: 10/28/2017  EKG Time: 0433  Rate: 75  Rhythm: normal EKG, normal sinus rhythm  Axis: RAD  Intervals:none  ST&T Change: Nonspecific  ____________________________________________  RADIOLOGY  ED MD interpretation: No pneumonia  Official radiology report(s): Dg Chest Port 1 View  Result Date: 10/28/2017 CLINICAL DATA:  Hypoglycemia EXAM: PORTABLE CHEST 1 VIEW COMPARISON:  01/26/2016 FINDINGS: The heart size and mediastinal contours are within normal limits. Lung volumes are low with crowding of interstitial lung markings. No pneumonic consolidation, CHF, effusion or pneumothorax. The visualized skeletal structures are unremarkable. IMPRESSION: No active disease. Electronically Signed   By: Tollie Ethavid  Kwon M.D.   On: 10/28/2017 03:24    ____________________________________________   PROCEDURES  Procedure(s) performed: None  Procedures  Critical Care performed: No  ____________________________________________   INITIAL IMPRESSION / ASSESSMENT AND PLAN / ED COURSE  As part of my medical decision making, I reviewed the following data within the electronic medical  record:  History obtained from family, Nursing notes reviewed and incorporated, Labs reviewed, EKG interpreted, Old chart reviewed, Radiograph reviewed and Notes from prior ED visits.   57 year old female insulin-dependent diabetic who presents with hypoglycemia.  Chart review demonstrates patient has been seen for recurrent hypoglycemia.  Most recently patient was seen at [facility] on 2/9 for hypoglycemia most likely secondary to administering too much insulin.  Will check screening lab work, administer 1 amp D50 and observe.  Clinical Course as of Oct 28 645  Wynelle Link Oct 28, 2017  0507 Patient maintaining her blood sugar, currently 132.  Awaiting results of urinalysis.  [JS]  [id number] Updated patient and spouse of urine result.  Will administer dose of IV antibiotic.  Will continue to monitor blood sugar.  [JS]  0641  Resting no acute distress.  Oral temperature  98 F.  Discussed with patient and her spouse; anticipate discharge home with Cipro if patient maintains her blood sugars.  Strict return precautions given.  Both verbalize understanding and agree with plan of care.  [JS]  [id number] Of note, I reviewed patient's office visit with her PCP at [facility] on 10/19/2017 when she followed up after her ED visit the same day.  At the time she was instructed to only take her NPH 70/30 insulin 12 units at breakfast only.  She was to discontinue Victoza.  I am not convinced that the patient and her husband are doing this because they mentioned that she is taking NPH twice daily and taking Victoza.  I will reiterate her PCPs instructions to them.  [JS]    Clinical Course User Index [JS] Irean Hong, MD     ____________________________________________   FINAL CLINICAL IMPRESSION(S) / ED DIAGNOSES  Final diagnoses:  Hypoglycemia  Urinary tract infection without hematuria, site unspecified     ED Discharge Orders    None       Note:  This document was prepared using Dragon voice recognition software and may include unintentional dictation errors.    Irean Hong, MD 10/28/17 [phone]

## 2017-10-28 NOTE — ED Notes (Signed)
Pt lethargic and slow to respond.

## 2017-10-31 LAB — URINE CULTURE: Culture: >=100000 — AB

## 2017-11-01 NOTE — Progress Notes (Signed)
ED CULTURE REPORT   57 yo female presented to the ED on 2/17 with c/o hypoglycemia. During ED visit, she was found to have "few" bacteria in her urine, a urine culture was obtained, and she was discharged on ciprofloxacin 500mg  BID x 7 days. Her urine cultures resulted on 2/21 showing E coli sensitive to ciprofloxacin. I discussed her case with ED MD Dr. Cyril LoosenKinner who agreed no further action was needed.   Results for orders placed or performed during the hospital encounter of 10/28/17  Urine culture     Status: Abnormal   Collection Time: 10/28/17  3:02 AM  Result Value Ref Range Status   Specimen Description   Final    URINE, RANDOM Performed at [facility], [address] [place], [place] [address]    Special Requests   Final    NONE Performed at [facility], [address] [place], [place] [address]    Culture >=100,000 COLONIES/mL ESCHERICHIA COLI (A)  Final   Report Status 10/31/2017 FINAL  Final   Organism ID, Bacteria ESCHERICHIA COLI (A)  Final      Susceptibility   Escherichia coli - MIC*    AMPICILLIN 4 SENSITIVE Sensitive     CEFAZOLIN <=4 SENSITIVE Sensitive     CEFTRIAXONE <=1 SENSITIVE Sensitive     CIPROFLOXACIN <=0.25 SENSITIVE Sensitive     GENTAMICIN <=1 SENSITIVE Sensitive     IMIPENEM <=0.25 SENSITIVE Sensitive     NITROFURANTOIN <=16 SENSITIVE Sensitive     TRIMETH/SULFA <=20 SENSITIVE Sensitive     AMPICILLIN/SULBACTAM <=2 SENSITIVE Sensitive     PIP/TAZO <=4 SENSITIVE Sensitive     Extended ESBL NEGATIVE Sensitive     * >=100,000 COLONIES/mL ESCHERICHIA COLI   Yolanda BonineHannah Kayton Ripp, PharmD Pharmacy Resident

## 2018-10-31 ENCOUNTER — Observation Stay
Admission: EM | Admit: 2018-10-31 | Discharge: 2018-11-01 | Disposition: A | Discharge status: Home / Self Care | Payer: BLUE CROSS/BLUE SHIELD | Attending: Internal Medicine | Admitting: Internal Medicine

## 2018-10-31 ENCOUNTER — Other Ambulatory Visit: Payer: Self-pay

## 2018-10-31 ENCOUNTER — Emergency Department: Payer: BLUE CROSS/BLUE SHIELD

## 2018-10-31 DIAGNOSIS — E11649 Type 2 diabetes mellitus with hypoglycemia without coma: Secondary | ICD-10-CM | POA: Diagnosis not present

## 2018-10-31 DIAGNOSIS — Z79899 Other long term (current) drug therapy: Secondary | ICD-10-CM | POA: Insufficient documentation

## 2018-10-31 DIAGNOSIS — Z794 Long term (current) use of insulin: Secondary | ICD-10-CM | POA: Diagnosis not present

## 2018-10-31 DIAGNOSIS — Z888 Allergy status to other drugs, medicaments and biological substances status: Secondary | ICD-10-CM | POA: Insufficient documentation

## 2018-10-31 DIAGNOSIS — E785 Hyperlipidemia, unspecified: Secondary | ICD-10-CM | POA: Diagnosis not present

## 2018-10-31 DIAGNOSIS — E1151 Type 2 diabetes mellitus with diabetic peripheral angiopathy without gangrene: Secondary | ICD-10-CM | POA: Diagnosis not present

## 2018-10-31 DIAGNOSIS — Z8673 Personal history of transient ischemic attack (TIA), and cerebral infarction without residual deficits: Secondary | ICD-10-CM | POA: Diagnosis not present

## 2018-10-31 DIAGNOSIS — Z885 Allergy status to narcotic agent status: Secondary | ICD-10-CM | POA: Insufficient documentation

## 2018-10-31 DIAGNOSIS — I10 Essential (primary) hypertension: Secondary | ICD-10-CM | POA: Diagnosis not present

## 2018-10-31 DIAGNOSIS — R4182 Altered mental status, unspecified: Secondary | ICD-10-CM

## 2018-10-31 DIAGNOSIS — Z833 Family history of diabetes mellitus: Secondary | ICD-10-CM | POA: Diagnosis not present

## 2018-10-31 DIAGNOSIS — Z880 Allergy status to penicillin: Secondary | ICD-10-CM | POA: Diagnosis not present

## 2018-10-31 DIAGNOSIS — G9341 Metabolic encephalopathy: Secondary | ICD-10-CM | POA: Insufficient documentation

## 2018-10-31 DIAGNOSIS — E162 Hypoglycemia, unspecified: Secondary | ICD-10-CM | POA: Diagnosis present

## 2018-10-31 DIAGNOSIS — E119 Type 2 diabetes mellitus without complications: Secondary | ICD-10-CM

## 2018-10-31 LAB — GLUCOSE, CAPILLARY
GLUCOSE-CAPILLARY: 44 mg/dL — AB (ref 70–99)
Glucose-Capillary: 150 mg/dL — ABNORMAL HIGH (ref 70–99)
Glucose-Capillary: 165 mg/dL — ABNORMAL HIGH (ref 70–99)
Glucose-Capillary: 38 mg/dL — CL (ref 70–99)
Glucose-Capillary: 81 mg/dL (ref 70–99)
Glucose-Capillary: 93 mg/dL (ref 70–99)

## 2018-10-31 LAB — COMPREHENSIVE METABOLIC PANEL
ALT: 37 U/L (ref 0–44)
AST: 66 U/L — ABNORMAL HIGH (ref 15–41)
Albumin: 3.8 g/dL (ref 3.5–5.0)
Alkaline Phosphatase: 102 U/L (ref 38–126)
Anion gap: 6 (ref 5–15)
BILIRUBIN TOTAL: 0.9 mg/dL (ref 0.3–1.2)
BUN: 32 mg/dL — ABNORMAL HIGH (ref 6–20)
CALCIUM: 8.3 mg/dL — AB (ref 8.9–10.3)
CO2: 25 mmol/L (ref 22–32)
Chloride: 107 mmol/L (ref 98–111)
Creatinine, Ser: 1.3 mg/dL — ABNORMAL HIGH (ref 0.44–1.00)
GFR calc Af Amer: 52 mL/min — ABNORMAL LOW (ref >60)
GFR calc non Af Amer: 45 mL/min — ABNORMAL LOW (ref >60)
Glucose, Bld: 154 mg/dL — ABNORMAL HIGH (ref 70–99)
Potassium: 3.9 mmol/L (ref 3.5–5.1)
Sodium: 138 mmol/L (ref 135–145)
TOTAL PROTEIN: 8.4 g/dL — AB (ref 6.5–8.1)

## 2018-10-31 LAB — CBC WITH DIFFERENTIAL/PLATELET
Abs Immature Granulocytes: 0.01 10*3/uL (ref 0.00–0.07)
Basophils Absolute: 0 10*3/uL (ref 0.0–0.1)
Basophils Relative: 1 %
Eosinophils Absolute: 0.1 10*3/uL (ref 0.0–0.5)
Eosinophils Relative: 2 %
HCT: 44.2 % (ref 36.0–46.0)
Hemoglobin: 13.9 g/dL (ref 12.0–15.0)
Immature Granulocytes: 0 %
Lymphocytes Relative: 44 %
Lymphs Abs: 2.7 10*3/uL (ref 0.7–4.0)
MCH: 29.1 pg (ref 26.0–34.0)
MCHC: 31.4 g/dL (ref 30.0–36.0)
MCV: 92.7 fL (ref 80.0–100.0)
MONO ABS: 0.6 10*3/uL (ref 0.1–1.0)
Monocytes Relative: 11 %
Neutro Abs: 2.5 10*3/uL (ref 1.7–7.7)
Neutrophils Relative %: 42 %
Platelets: 233 10*3/uL (ref 150–400)
RBC: 4.77 MIL/uL (ref 3.87–5.11)
RDW: 14.6 % (ref 11.5–15.5)
WBC: 5.9 10*3/uL (ref 4.0–10.5)
nRBC: 0 % (ref 0.0–0.2)

## 2018-10-31 LAB — URINALYSIS, COMPLETE (UACMP) WITH MICROSCOPIC
Bilirubin Urine: NEGATIVE
Glucose, UA: 150 mg/dL — AB
Ketones, ur: NEGATIVE mg/dL
Leukocytes,Ua: NEGATIVE
Nitrite: NEGATIVE
Protein, ur: 30 mg/dL — AB
Specific Gravity, Urine: 1.009 (ref 1.005–1.030)
pH: 6 (ref 5.0–8.0)

## 2018-10-31 LAB — PROCALCITONIN: Procalcitonin: <0.1 ng/mL

## 2018-10-31 LAB — INFLUENZA PANEL BY PCR (TYPE A & B)
Influenza A By PCR: NEGATIVE
Influenza B By PCR: NEGATIVE

## 2018-10-31 LAB — LACTIC ACID, PLASMA: Lactic Acid, Venous: 1.5 mmol/L (ref 0.5–1.9)

## 2018-10-31 MED ORDER — DEXTROSE 10 % IV SOLN
INTRAVENOUS | Status: DC
  Administered 2018-10-31: 21:00:00 via INTRAVENOUS

## 2018-10-31 MED ORDER — DEXTROSE 50 % IV SOLN
INTRAVENOUS | Status: AC
  Filled 2018-10-31: qty 50

## 2018-10-31 MED ORDER — LIDOCAINE HCL (PF) 1 % IJ SOLN
INTRAMUSCULAR | Status: AC
  Filled 2018-10-31: qty 5

## 2018-10-31 MED ORDER — DEXTROSE 50 % IV SOLN
50.0000 mL | Freq: Once | INTRAVENOUS | Status: AC
  Administered 2018-10-31: 50 mL via INTRAVENOUS

## 2018-10-31 MED ORDER — SODIUM CHLORIDE 0.9 % IV BOLUS
500.0000 mL | Freq: Once | INTRAVENOUS | Status: AC
  Administered 2018-10-31: 500 mL via INTRAVENOUS

## 2018-10-31 MED ORDER — DEXTROSE 50 % IV SOLN
1.0000 | Freq: Once | INTRAVENOUS | Status: AC
  Administered 2018-10-31: 50 mL via INTRAVENOUS

## 2018-10-31 MED ORDER — DEXTROSE 50 % IV SOLN
INTRAVENOUS | Status: AC
  Administered 2018-10-31: 50 mL via INTRAVENOUS
  Filled 2018-10-31: qty 50

## 2018-10-31 NOTE — ED Triage Notes (Addendum)
Arrives to ER via [facility] from home. Husband left home approx 1530 and returned 1630. Pt in bed, nonverbal. EMS reports CBG of 50, given 1mg  glucagon IM and CBG up to 70. Pt gazing towards right, nonverbal with this RN.   axillary temperature 2F.

## 2018-10-31 NOTE — ED Notes (Signed)
EDP informed of patient rectal temperature.

## 2018-10-31 NOTE — ED Notes (Signed)
Patient transported to X-ray 

## 2018-10-31 NOTE — ED Notes (Signed)
Due to difficult IV stick, lactic and blood cultures not obtained at this time. PIV access X 2 in proccess.

## 2018-10-31 NOTE — ED Provider Notes (Signed)
[name] Gilbert    First MD Initiated Contact with Patient 10/31/18 1817     (approximate)  I have reviewed the triage vital signs and the nursing notes.   HISTORY  Chief Complaint Altered Mental Status    HPI [name] Gilbert is a 58 y.o. female presents the ER for evaluation of altered mental status.  Patient last seen normal roughly 1 to 2 hours prior to arrival.  Husband had gone out to run errands and came back to find the patient altered and unresponsive.  Had a right lateral gaze.  EMS found the patient found her to be hypothermic to 92 degrees and hypoglycemic to the 30s.  She was given IM glucagon.  They are unable to establish IV access and brought her to the ER.  She arrives encephalopathic.  Not following commands with seemingly right gaze deviation preference.  Repeat glucose here in the 30s.  She was given D50 with improvement of symptoms   Past Medical History:  Diagnosis Date  . Diabetes mellitus without complication (HCC)   . Hypertension   . Stroke (HCC)   . visual impaired    Family History  Problem Relation Age of Onset  . Diabetes Father    Past Surgical History:  Procedure Laterality Date  . ABDOMINAL HYSTERECTOMY    . TONSILLECTOMY     Patient Active Problem List   Diagnosis Date Noted  . 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  . 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 Hypoglycemia 01/26/2016      Prior to Admission medications   Medication Sig Start Date End Date Taking? Authorizing Provider  amLODipine (NORVASC) 10 MG tablet Take 10 mg by mouth daily. 10/28/18  Yes [provider]  atorvastatin (LIPITOR) 40 MG tablet Take 40 mg by mouth daily.   Yes [provider]  carvedilol (COREG) 12.5 MG tablet Take 25 mg by mouth 2 (two) times daily.    Yes [provider]  lisinopril-hydrochlorothiazide (PRINZIDE,ZESTORETIC) 20-25 MG tablet Take 1 tablet by mouth daily. 10/28/18 10/28/19 Yes [provider]  insulin aspart protamine- aspart (NOVOLOG MIX 70/30) (70-30) 100 UNIT/ML injection Inject 0.05 mLs (5 Units total) into the skin 2 (two) times daily with a meal. Patient taking differently: Inject 18 Units into the skin daily with breakfast.  08/23/16   Delfino LovettShah, Vipul, MD    Allergies Penicillins; Codeine; Metformin; and Statins    Social History Social History   Tobacco Use  . Smoking status: Never Smoker  . Smokeless tobacco: Never Used  Substance Use Topics  . Alcohol use: No  . Drug use: No    Review of Systems Patient denies headaches, rhinorrhea, blurry vision, numbness, shortness of breath, chest pain, edema, cough, abdominal pain, nausea, vomiting, diarrhea, dysuria, fevers, rashes or hallucinations unless otherwise stated above in HPI. ____________________________________________   PHYSICAL EXAM:  VITAL SIGNS: Vitals:   10/31/18 2300 10/31/18 2330  BP: 129/70 128/64  Pulse: 66 67  Resp: 15 17  Temp:    SpO2: 99% 99%    Constitutional: acute encephalopathy with rightward gaze deviation.   Eyes: Conjunctivae are normal.  Head: Atraumatic. Nose: No congestion/rhinnorhea. Mouth/Throat: Mucous membranes are moist.   Neck: No stridor. Painless ROM.  Cardiovascular: Normal rate, regular rhythm. Grossly normal heart sounds.  Good peripheral circulation. Respiratory: Normal respiratory effort.  No retractions. Lungs CTAB. Gastrointestinal: Soft and nontender.  No distention. No abdominal bruits. No CVA tenderness. Genitourinary: deferred Musculoskeletal: No lower extremity tenderness nor edema.  No joint effusions. Neurologic:  Right gaze deviation and nonverbal, sx resolved after D50.  MAE spontaneously, following commands, no facial droop or gaze deviation. Skin:  Skin is warm, dry and intact. No rash  noted. Psychiatric: Mood and affect are normal. Speech and behavior are normal.  ____________________________________________   LABS (all labs ordered are listed, but only abnormal results are displayed)  Results for orders placed or performed during the hospital encounter of 10/31/18 (from the past 24 hour(s))  Glucose, capillary     Status: Abnormal   Collection Time: 10/31/18  6:18 PM  Result Value Ref Range   Glucose-Capillary 44 (LL) 70 - 99 mg/dL  Glucose, capillary     Status: Abnormal   Collection Time: 10/31/18  6:31 PM  Result Value Ref Range   Glucose-Capillary 165 (H) 70 - 99 mg/dL  CBC WITH DIFFERENTIAL     Status: None   Collection Time: 10/31/18  6:31 PM  Result Value Ref Range   WBC 5.9 4.0 - 10.5 K/uL   RBC 4.77 3.87 - 5.11 MIL/uL   Hemoglobin 13.9 12.0 - 15.0 g/dL   HCT 82.9 56.2 - 13.0 %   MCV 92.7 80.0 - 100.0 fL   MCH 29.1 26.0 - 34.0 pg   MCHC 31.4 30.0 - 36.0 g/dL   RDW 86.5 78.4 - 69.6 %   Platelets 233 150 - 400 K/uL   nRBC 0.0 0.0 - 0.2 %   Neutrophils Relative % 42 %   Neutro Abs 2.5 1.7 - 7.7 K/uL   Lymphocytes Relative 44 %   Lymphs Abs 2.7 0.7 - 4.0 K/uL   Monocytes Relative 11 %   Monocytes Absolute 0.6 0.1 - 1.0 K/uL   Eosinophils Relative 2 %   Eosinophils Absolute 0.1 0.0 - 0.5 K/uL   Basophils Relative 1 %   Basophils Absolute 0.0 0.0 - 0.1 K/uL   Immature Granulocytes 0 %   Abs Immature Granulocytes 0.01 0.00 - 0.07 K/uL  Urinalysis, Complete w Microscopic     Status: Abnormal   Collection Time: 10/31/18  6:31 PM  Result Value Ref Range   Color, Urine COLORLESS (A) YELLOW   APPearance CLEAR (A) CLEAR   Specific Gravity, Urine 1.009 1.005 - 1.030   pH 6.0 5.0 - 8.0   Glucose, UA 150 (A) NEGATIVE mg/dL   Hgb urine dipstick SMALL (A) NEGATIVE   Bilirubin Urine NEGATIVE NEGATIVE   Ketones, ur NEGATIVE NEGATIVE mg/dL   Protein, ur 30 (A) NEGATIVE mg/dL   Nitrite NEGATIVE NEGATIVE   Leukocytes,Ua NEGATIVE NEGATIVE   RBC / HPF 0-5 0  - 5 RBC/hpf   WBC, UA 0-5 0 - 5 WBC/hpf   Bacteria, UA RARE (A) NONE SEEN   Squamous Epithelial / LPF 0-5 0 - 5  Influenza panel by PCR (type A & B)     Status: None   Collection Time: 10/31/18  6:31 PM  Result Value Ref Range   Influenza A By PCR NEGATIVE NEGATIVE   Influenza B By PCR NEGATIVE NEGATIVE  Glucose, capillary     Status: Abnormal   Collection Time: 10/31/18  7:49 PM  Result Value Ref Range   Glucose-Capillary 38 (LL) 70 - 99 mg/dL   Comment 1 Document in Chart   Procalcitonin - Baseline     Status: None   Collection Time: 10/31/18  8:26 PM  Result Value Ref  Range   Procalcitonin <0.10 ng/mL  Lactic acid, plasma     Status: None   Collection Time: 10/31/18  8:28 PM  Result Value Ref Range   Lactic Acid, Venous 1.5 0.5 - 1.9 mmol/L  Comprehensive metabolic panel     Status: Abnormal   Collection Time: 10/31/18  8:28 PM  Result Value Ref Range   Sodium 138 135 - 145 mmol/L   Potassium 3.9 3.5 - 5.1 mmol/L   Chloride 107 98 - 111 mmol/L   CO2 25 22 - 32 mmol/L   Glucose, Bld 154 (H) 70 - 99 mg/dL   BUN 32 (H) 6 - 20 mg/dL   Creatinine, Ser 3.88 (H) 0.44 - 1.00 mg/dL   Calcium 8.3 (L) 8.9 - 10.3 mg/dL   Total Protein 8.4 (H) 6.5 - 8.1 g/dL   Albumin 3.8 3.5 - 5.0 g/dL   AST 66 (H) 15 - 41 U/L   ALT 37 0 - 44 U/L   Alkaline Phosphatase 102 38 - 126 U/L   Total Bilirubin 0.9 0.3 - 1.2 mg/dL   GFR calc non Af Amer 45 (L) >60 mL/min   GFR calc Af Amer 52 (L) >60 mL/min   Anion gap 6 5 - 15  Glucose, capillary     Status: None   Collection Time: 10/31/18  8:58 PM  Result Value Ref Range   Glucose-Capillary 93 70 - 99 mg/dL  Glucose, capillary     Status: None   Collection Time: 10/31/18 10:00 PM  Result Value Ref Range   Glucose-Capillary 81 70 - 99 mg/dL  Glucose, capillary     Status: Abnormal   Collection Time: 10/31/18 11:35 PM  Result Value Ref Range   Glucose-Capillary 150 (H) 70 - 99 mg/dL   ____________________________________________  EKG My  review and personal interpretation at Time: 18:24   Indication: unresponsive  Rate: 85  Rhythm: sinus Axis: normal Other: normal intervals, no stemi ____________________________________________  RADIOLOGY  I personally reviewed all radiographic images ordered to evaluate for the above acute complaints and reviewed radiology reports and findings.  These findings were personally discussed with the patient.  Please see medical record for radiology report.  ____________________________________________   PROCEDURES  Procedure(s) performed:  .Critical Care Performed by: Willy Eddy, MD Authorized by: Willy Eddy, MD   Critical care provider statement:    Critical care time (minutes):  40   Critical care time was exclusive of:  Separately billable procedures and treating other patients   Critical care was necessary to treat or prevent imminent or life-threatening deterioration of the following conditions:  Endocrine crisis   Critical care was time spent personally by me on the following activities:  Development of treatment plan with patient or surrogate, discussions with consultants, evaluation of patient's response to treatment, examination of patient, obtaining history from patient or surrogate, ordering and performing treatments and interventions, ordering and review of laboratory studies, ordering and review of radiographic studies, pulse oximetry, re-evaluation of patient's condition and review of old charts   Due to difficulty with obtaining IV access, a 20G peripheral IV catheter was inserted using US guidance into the LUE.  The site was prepped with chlorhexidine and allowed to dry.  The patient tolerated the procedure without any complications.    Critical Care performed: yes ____________________________________________   INITIAL IMPRESSION / ASSESSMENT AND PLAN / ED COURSE  Pertinent labs & imaging results that were available during my care of the patient were  reviewed by me and  considered in my medical decision making (see chart for details).   DDX: Dehydration, sepsis, pna, uti, hypoglycemia, cva, drug effect, withdrawal, encephalitis   Darrielle Schoening is a 58 y.o. who presents to the ED with altered mental status and evidence of hypoglycemia.  Patient given D50 with improvement in symptoms.  Does not seem consistent with stroke but will order blood work as well as CT imaging and chest x-ray to evaluate for underlying etiology to explain the patient's hypoglycemia.  She was found to be hypothermic which may be secondary to prolonged hypoglycemia.  Will order septic evaluation.  The patient will be placed on continuous pulse oximetry and telemetry for monitoring.  Laboratory evaluation will be sent to evaluate for the above complaints.     Clinical Course as of Nov 01 2351  Thu Oct 31, 2018  0938 Patient now more alert.  Repeat glucose is 165.  Responding.  States that she needs to urinate.  Have a high suspicion for sepsis therefore will order blood work for that.  Does not seem consistent with code stroke given hypoglycemia.   [PR]  1924 Patient reassessed.  Denies any pain.  Denies any weakness but feels cold.   [PR]  2000 Repeat glucose down to 38.  Patient given another bolus of D50.  Will start on D10 drip.   [PR]  2113 Patient stabilizing on D10 infusion.  I do not have any other markers to suggest sepsis.  No identifiable source at this time   [PR]  2221 Procalcitonin is negative.  Doubt infectious process at this point.  Regardless will require hospitalization for further medical management given her recurrent hypoglycemia.   [PR]    Clinical Course User Index [PR] Willy Eddy, MD     As part of my medical decision making, I reviewed the following data within the electronic MEDICAL RECORD NUMBER Nursing notes reviewed and incorporated, Labs reviewed, notes from prior ED visits and [place] Controlled Substance  Database   ____________________________________________   FINAL CLINICAL IMPRESSION(S) / ED DIAGNOSES  Final diagnoses:  Acute metabolic encephalopathy due to hypoglycemia  Altered mental status, unspecified altered mental status type      NEW MEDICATIONS STARTED DURING THIS VISIT:  New Prescriptions   No medications on file     Gilbert:  This document was prepared using Dragon voice recognition software and may include unintentional dictation errors.    Willy Eddy, MD 10/31/18 [phone]

## 2018-11-01 DIAGNOSIS — E119 Type 2 diabetes mellitus without complications: Secondary | ICD-10-CM

## 2018-11-01 DIAGNOSIS — I10 Essential (primary) hypertension: Secondary | ICD-10-CM | POA: Diagnosis present

## 2018-11-01 LAB — URINE CULTURE

## 2018-11-01 LAB — CBC
HCT: 37.4 % (ref 36.0–46.0)
Hemoglobin: 12.3 g/dL (ref 12.0–15.0)
MCH: 29.1 pg (ref 26.0–34.0)
MCHC: 32.9 g/dL (ref 30.0–36.0)
MCV: 88.6 fL (ref 80.0–100.0)
Platelets: 248 10*3/uL (ref 150–400)
RBC: 4.22 MIL/uL (ref 3.87–5.11)
RDW: 14.2 % (ref 11.5–15.5)
WBC: 6.2 10*3/uL (ref 4.0–10.5)
nRBC: 0 % (ref 0.0–0.2)

## 2018-11-01 LAB — BASIC METABOLIC PANEL
ANION GAP: 5 (ref 5–15)
BUN: 27 mg/dL — ABNORMAL HIGH (ref 6–20)
CO2: 24 mmol/L (ref 22–32)
Calcium: 8.5 mg/dL — ABNORMAL LOW (ref 8.9–10.3)
Chloride: 110 mmol/L (ref 98–111)
Creatinine, Ser: 1.11 mg/dL — ABNORMAL HIGH (ref 0.44–1.00)
GFR calc Af Amer: >60 mL/min (ref >60)
GFR calc non Af Amer: 54 mL/min — ABNORMAL LOW (ref >60)
Glucose, Bld: 173 mg/dL — ABNORMAL HIGH (ref 70–99)
Potassium: 4.2 mmol/L (ref 3.5–5.1)
Sodium: 139 mmol/L (ref 135–145)

## 2018-11-01 LAB — GLUCOSE, CAPILLARY
Glucose-Capillary: 192 mg/dL — ABNORMAL HIGH (ref 70–99)
Glucose-Capillary: 128 mg/dL — ABNORMAL HIGH (ref 70–99)
Glucose-Capillary: 127 mg/dL — ABNORMAL HIGH (ref 70–99)

## 2018-11-01 LAB — HEMOGLOBIN A1C
Hgb A1c MFr Bld: 7.7 % — ABNORMAL HIGH (ref 4.8–5.6)
Mean Plasma Glucose: 174.29 mg/dL

## 2018-11-01 MED ORDER — ONDANSETRON HCL 4 MG/2ML IJ SOLN: 4.0000 mg | Freq: Four times a day (QID) | INTRAMUSCULAR | Status: DC | PRN

## 2018-11-01 MED ORDER — INSULIN DEGLUDEC 100 UNIT/ML ~~LOC~~ SOPN: 10.0000 [IU] | PEN_INJECTOR | Freq: Every day | SUBCUTANEOUS | Status: DC

## 2018-11-01 MED ORDER — ACETAMINOPHEN 325 MG PO TABS: 650.0000 mg | ORAL_TABLET | Freq: Four times a day (QID) | ORAL | Status: DC | PRN

## 2018-11-01 MED ORDER — INSULIN ASPART 100 UNIT/ML FLEXPEN: 2.0000 [IU] | PEN_INJECTOR | Freq: Three times a day (TID) | SUBCUTANEOUS | 11 refills | Status: DC

## 2018-11-01 MED ORDER — ENOXAPARIN SODIUM 40 MG/0.4ML ~~LOC~~ SOLN: 40.0000 mg | SUBCUTANEOUS | Status: DC

## 2018-11-01 MED ORDER — ONDANSETRON HCL 4 MG PO TABS: 4.0000 mg | ORAL_TABLET | Freq: Four times a day (QID) | ORAL | Status: DC | PRN

## 2018-11-01 MED ORDER — ACETAMINOPHEN 650 MG RE SUPP: 650.0000 mg | Freq: Four times a day (QID) | RECTAL | Status: DC | PRN

## 2018-11-01 NOTE — Discharge Summary (Signed)
[facility] - [place] [facility]   PATIENT NAME: [name] Gilbert    MR#:  [id number]  DATE OF BIRTH:  [birth year]  DATE OF ADMISSION:  10/31/2018 ADMITTING PHYSICIAN: Oralia Manisavid Willis, MD  DATE OF DISCHARGE: 11/01/2018 12:19 PM  PRIMARY CARE PHYSICIAN: Tobey GrimMcCravy, Matthew S, MD    ADMISSION DIAGNOSIS:  Altered mental status, unspecified altered mental status type [R41.82] Acute metabolic encephalopathy due to hypoglycemia [G93.41, E16.2]  DISCHARGE DIAGNOSIS:  Principal Problem:   Hypoglycemia Active Problems:   Diabetes (HCC)   HTN (hypertension)   SECONDARY DIAGNOSIS:   Past Medical History:  Diagnosis Date  . Diabetes mellitus without complication (HCC)   . Hypertension   . Stroke (HCC)   . visual impaired     HOSPITAL COURSE:   1.  Hypoglycemia with diabetes.  Patient has been off any drips at this point.  She feels well and wanted to go home.  I will stop Victoza.  New short acting NovoLog 2 units prior to meals.  Decrease Tresiba insulin to 10 units nightly.  Follow-up with PMD and endocrine as outpatient patient [facility] at [facility].  Patient states that she has poor vision.  She does have the pens and she does get her husband to help out with looking at the numbers so she can inject properly.  Advised to check her sugars quite often and anytime she feels funny. 2.  Essential hypertension.  Hold on Norvasc.  Continue Coreg and lisinopril HCT 3.  Hyperlipidemia unspecified on atorvastatin   DISCHARGE CONDITIONS:   Satisfactory  CONSULTS OBTAINED:  None  DRUG ALLERGIES:   Allergies  Allergen Reactions  . Penicillins Hives, Nausea And Vomiting and Swelling    Has patient had a PCN reaction causing immediate rash, facial/tongue/throat swelling, SOB or lightheadedness with hypotension: Yes Has patient had a PCN reaction causing severe rash involving mucus membranes or skin necrosis: No Has patient had a PCN reaction that required hospitalization Yes Has  patient had a PCN reaction occurring within the last 10 years: No If all of the above answers are "NO", then may proceed with Cephalosporin use.   . Codeine Other (See Comments)    Reaction: unknown  . Metformin Other (See Comments)    Reaction: unknown  . 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   carvedilol 12.5 MG tablet Commonly known as:  COREG Take  25 mg by mouth 2 (two) times daily.   insulin aspart 100 UNIT/ML FlexPen Commonly known as:  NOVOLOG FLEXPEN Inject 2 Units into the skin 3 (three) times daily with meals. What changed:    how much to take  when to take this   insulin degludec 100 UNIT/ML Sopn FlexTouch Pen Commonly known as:  TRESIBA Inject 0.1 mLs (10 Units total) into the skin at bedtime. What changed:    how much to take  when to take this   lisinopril-hydrochlorothiazide 20-25 MG tablet Commonly known as:  PRINZIDE,ZESTORETIC Take 1  tablet by mouth daily. Notes to patient:  Take this when you get home        DISCHARGE INSTRUCTIONS:   Follow-up PMD 5 days  If you experience worsening of your admission symptoms, develop shortness of breath, life threatening emergency, suicidal or homicidal thoughts you must seek medical attention immediately by calling 911 or calling your MD immediately  if symptoms less severe.  You Must read complete instructions/literature along with all the possible adverse reactions/side effects for all the Medicines you take and that have been prescribed to you. Take any new Medicines after you have completely understood and accept all the possible adverse reactions/side effects.   Please note  You were cared for by a hospitalist during your hospital stay. If you have any questions about your discharge medications or the care you received while you were in the hospital after you are discharged, you can call the unit and asked to speak with the hospitalist on call if the hospitalist that took care of you is not available. Once you are discharged, your primary care physician will handle any further medical issues. Please note that NO REFILLS for any discharge medications will be authorized once you are discharged, as it is imperative that you return to your primary care physician (or establish a relationship with a primary care physician if you do not have one) for your aftercare needs so that they can reassess your need for medications and monitor your lab values.    Today   CHIEF COMPLAINT:   Chief Complaint  Patient presents with  . Altered Mental Status    HISTORY OF PRESENT ILLNESS:  [name] Gilbert  is a 58 y.o. female came in with altered mental status secondary to hypoglycemia   VITAL SIGNS:  Blood pressure (!) 151/73, pulse 79, temperature 98.3 F (36.8 C), temperature source Oral, resp. rate 17, height 5\' 5"  (1.651 m), weight 72.6 kg, SpO2 100 %.  I/O:  No intake or output  data in the 24 hours ending 11/01/18 1403  PHYSICAL EXAMINATION:  GENERAL:  58 y.o.-year-old patient lying in the bed with no acute distress.  EYES: Pupils equal, round, reactive to light and accommodation. No scleral icterus. Extraocular muscles intact.  HEENT: Head atraumatic, normocephalic. Oropharynx and nasopharynx clear.  NECK:  Supple, no jugular venous distention. No thyroid enlargement, no tenderness.  LUNGS: Normal breath sounds bilaterally, no wheezing, rales,rhonchi or crepitation. No use of accessory muscles of respiration.  CARDIOVASCULAR: S1, S2 normal. No murmurs, rubs, or gallops.  ABDOMEN: Soft, non-tender, non-distended. Bowel sounds present. No organomegaly or mass.  EXTREMITIES: No pedal edema, cyanosis, or clubbing.  NEUROLOGIC: Cranial nerves II through XII are intact. Muscle strength 5/5 in all extremities. Sensation intact. Gait not checked.  PSYCHIATRIC: The patient is alert and oriented x 3.  SKIN: No obvious rash, lesion, or ulcer.   DATA REVIEW:   CBC Recent Labs  Lab 11/01/18  0430  WBC 6.2  HGB 12.3  HCT 37.4  PLT 248    Chemistries  Recent Labs  Lab 10/31/18 2028 11/01/18 0430  NA 138 139  K 3.9 4.2  CL 107 110  CO2 25 24  GLUCOSE 154* 173*  BUN 32* 27*  CREATININE 1.30* 1.11*  CALCIUM 8.3* 8.5*  AST 66*  --   ALT 37  --   ALKPHOS 102  --   BILITOT 0.9  --      Microbiology Results  Results for orders placed or performed during the hospital encounter of 10/31/18  Urine culture     Status: None   Collection Time: 10/31/18  6:31 PM  Result Value Ref Range Status   Specimen Description   Final    URINE, RANDOM Performed at [facility], [address] [place], [place] [address]    Special Requests   Final    NONE Performed at [facility], [address] [place], [place] [address]    Culture   Final    Multiple bacterial morphotypes present, none predominant. Suggest appropriate recollection if clinically  indicated.   Report Status 11/01/2018 FINAL  Final  Blood Culture (routine x 2)     Status: None (Preliminary result)   Collection Time: 10/31/18  8:28 PM  Result Value Ref Range Status   Specimen Description BLOOD BLOOD LEFT HAND  Final   Special Requests   Final    BOTTLES DRAWN AEROBIC ONLY Blood Culture results may not be optimal due to an inadequate volume of blood received in culture bottles   Culture   Final    NO GROWTH < 12 HOURS Performed at [facility], [address] [place], [place] [address]    Report Status PENDING  Incomplete  Blood Culture (routine x 2)     Status: None (Preliminary result)   Collection Time: 10/31/18  8:28 PM  Result Value Ref Range Status   Specimen Description BLOOD BLOOD RIGHT ARM  Final   Special Requests   Final    BOTTLES DRAWN AEROBIC AND ANAEROBIC Blood Culture results may not be optimal due to an inadequate volume of blood received in culture bottles   Culture   Final    NO GROWTH < 12 HOURS Performed at [facility], [address] [place], [place] [address]    Report Status PENDING  Incomplete    RADIOLOGY:  Dg Chest 2 View  Result Date: 10/31/2018 CLINICAL DATA:  58 year old female found with altered mental status, nonverbal. Fever. EXAM: CHEST - 2 VIEW COMPARISON:  10/28/2017 and earlier. FINDINGS: Seated AP and lateral views of the chest. Low lung volumes similar to the 2019 comparison. Normal cardiac size and mediastinal contours. Visualized tracheal air column is within normal limits. No pneumothorax, pleural effusion or consolidation. Mild increased pulmonary interstitial markings similar to those in 2019 which are probably crowding/atelectasis. No confluent pulmonary opacity. Negative visible bowel gas pattern. No acute osseous abnormality identified. IMPRESSION: Low lung volumes with suspected crowding of lung markings. No convincing acute cardiopulmonary abnormality. Electronically Signed   By: Odessa FlemingH  Hall M.D.    On: 10/31/2018 19:07   Ct Head Wo Contrast  Result Date: 10/31/2018 CLINICAL DATA:  58 year old female found with altered mental status, nonverbal. Hypoglycemia. Fever. EXAM: CT HEAD WITHOUT CONTRAST TECHNIQUE: Contiguous axial images were obtained from the base of the skull through the vertex without intravenous contrast. COMPARISON:  Head CTs 08/22/2016 and earlier. FINDINGS: Brain: Chronic encephalomalacia in  the left parietal and occipital lobes. Ex vacuo enlargement of the left lateral ventricle atrium. Patchy chronic encephalomalacia also in the right superior parietal lobe. Chronic heterogeneous hypodensity in the left lentiform. Stable gray-white matter differentiation throughout the brain. No midline shift, ventriculomegaly, mass effect, evidence of mass lesion, intracranial hemorrhage or evidence of cortically based acute infarction. Vascular: Calcified atherosclerosis at the skull base. No suspicious intracranial vascular hyperdensity. Skull: Negative. Sinuses/Orbits: Visualized paranasal sinuses and mastoids are stable and well pneumatized. Other: Stable orbits and scalp. IMPRESSION: 1. No acute intracranial abnormality. 2. 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 Electronically Signed   By: Odessa Fleming M.D.   On: 10/31/2018 19:27     Management plans discussed with the patient, family and they are in agreement.  CODE STATUS:     Code Status Orders  (From admission, onward)         Start     Ordered   11/01/18 0055  Full code  Continuous     11/01/18 0054        Code Status History    Date Active Date Inactive Code Status Order ID Comments User Context   08/22/2016 0507 08/23/2016 1549 Full Code [id number]  Ihor Austin, MD Inpatient   01/26/2016 1446 01/28/2016 1925 Full Code [id number]  Enedina Finner, MD ED      TOTAL TIME TAKING CARE OF THIS PATIENT: 35  minutes.    Alford Highland M.D on 11/01/2018 at 2:03 PM  Between 7am to 6pm - Pager - [phone]  After 6pm go to [email or web address] - password [email or web address]  [facility] Office  [phone]  CC: Primary care physician; Tobey Grim, MD

## 2018-11-01 NOTE — Progress Notes (Signed)
Discharge to home with husband.  Adjustments made to insulin doses.  No additional meds. Says she feels better than she did this morning.

## 2018-11-01 NOTE — ED Notes (Signed)
Admitting MD at bedside.

## 2018-11-01 NOTE — Discharge Instructions (Signed)
Blood Glucose Monitoring, Adult °Monitoring your blood sugar (glucose) is an important part of managing your diabetes (diabetes mellitus). Blood glucose monitoring involves checking your blood glucose as often as directed and keeping a record (log) of your results over time. °Checking your blood glucose regularly and keeping a blood glucose log can: °· Help you and your health care provider adjust your diabetes management plan as needed, including your medicines or insulin. °· Help you understand how food, exercise, illnesses, and medicines affect your blood glucose. °· Let you know what your blood glucose is at any time. You can quickly find out if you have low blood glucose (hypoglycemia) or high blood glucose (hyperglycemia). °Your health care provider will set individualized treatment goals for you. Your goals will be based on your age, other medical conditions you have, and how you respond to diabetes treatment. Generally, the goal of treatment is to maintain the following blood glucose levels: °· Before meals (preprandial): 80-130 mg/dL (4.4-7.2 mmol/L). °· After meals (postprandial): below 180 mg/dL (10 mmol/L). °· A1c level: less than 7%. °Supplies needed: °· Blood glucose meter. °· Test strips for your meter. Each meter has its own strips. You must use the strips that came with your meter. °· A needle to prick your finger (lancet). Do not use a lancet more than one time. °· A device that holds the lancet (lancing device). °· A journal or log book to write down your results. °How to check your blood glucose ° °1. Wash your hands with soap and water. °2. Prick the side of your finger (not the tip) with the lancet. Use a different finger each time. °3. Gently rub the finger until a small drop of blood appears. °4. Follow instructions that come with your meter for inserting the test strip, applying blood to the strip, and using your blood glucose meter. °5. Write down your result and any notes. °Some meters  allow you to use areas of your body other than your finger (alternative sites) to test your blood. The most common alternative sites are: °· Forearm. °· Thigh. °· Palm of the hand. °If you think you may have hypoglycemia, or if you have a history of not knowing when your blood glucose is getting low (hypoglycemia unawareness), do not use alternative sites. Use your finger instead. Alternative sites may not be as accurate as the fingers, because blood flow is slower in these areas. This means that the result you get may be delayed, and it may be different from the result that you would get from your finger. °Follow these instructions at home: °Blood glucose log ° °· Every time you check your blood glucose, write down your result. Also write down any notes about things that may be affecting your blood glucose, such as your diet and exercise for the day. This information can help you and your health care provider: °? Look for patterns in your blood glucose over time. °? Adjust your diabetes management plan as needed. °· Check if your meter allows you to download your records to a computer. Most glucose meters store a record of glucose readings in the meter. °If you have type 1 diabetes: °· Check your blood glucose 2 or more times a day. °· Also check your blood glucose: °? Before every insulin injection. °? Before and after exercise. °? Before meals. °? 2 hours after a meal. °? Occasionally between 2:00 a.m. and 3:00 a.m., as directed. °? Before potentially dangerous tasks, like driving or using heavy machinery. °?   At bedtime.  You may need to check your blood glucose more often, up to 6-10 times a day, if you: ? Use an insulin pump. ? Need multiple daily injections (MDI). ? Have diabetes that is not well-controlled. ? Are ill. ? Have a history of severe hypoglycemia. ? Have hypoglycemia unawareness. If you have type 2 diabetes:  If you take insulin or other diabetes medicines, check your blood glucose 2 or  more times a day.  If you are on intensive insulin therapy, check your blood glucose 4 or more times a day. Occasionally, you may also need to check between 2:00 a.m. and 3:00 a.m., as directed.  Also check your blood glucose: ? Before and after exercise. ? Before potentially dangerous tasks, like driving or using heavy machinery.  You may need to check your blood glucose more often if: ? Your medicine is being adjusted. ? Your diabetes is not well-controlled. ? You are ill. General tips  Always keep your supplies with you.  If you have questions or need help, all blood glucose meters have a 24-hour "hotline" phone number that you can call. You may also contact your health care provider.  After you use a few boxes of test strips, adjust (calibrate) your blood glucose meter by following instructions that came with your meter. Contact a health care provider if:  Your blood glucose is at or above 240 mg/dL (13.3 mmol/L) for 2 days in a row.  You have been sick or have had a fever for 2 days or longer, and you are not getting better.  You have any of the following problems for more than 6 hours: ? You cannot eat or drink. ? You have nausea or vomiting. ? You have diarrhea. Get help right away if:  Your blood glucose is lower than 54 mg/dL (3 mmol/L).  You become confused or you have trouble thinking clearly.  You have difficulty breathing.  You have moderate or large ketone levels in your urine. Summary  Monitoring your blood sugar (glucose) is an important part of managing your diabetes (diabetes mellitus).  Blood glucose monitoring involves checking your blood glucose as often as directed and keeping a record (log) of your results over time.  Your health care provider will set individualized treatment goals for you. Your goals will be based on your age, other medical conditions you have, and how you respond to diabetes treatment.  Every time you check your blood glucose,  write down your result. Also write down any notes about things that may be affecting your blood glucose, such as your diet and exercise for the day. This information is not intended to replace advice given to you by your health care provider. Make sure you discuss any questions you have with your health care provider. Document Released: 08/31/2003 Document Revised: 07/09/2017 Document Reviewed: 02/07/2016 Elsevier Interactive Patient Education  2019 [place].   Hypoglycemia Hypoglycemia is when the sugar (glucose) level in your blood is too low. Signs of low blood sugar may include:  Feeling: ? Hungry. ? Worried or nervous (anxious). ? Sweaty and clammy. ? Confused. ? Dizzy. ? Sleepy. ? Sick to your stomach (nauseous).  Having: ? A fast heartbeat. ? A headache. ? A change in your vision. ? Tingling or no feeling (numbness) around your mouth, lips, or tongue. ? Jerky movements that you cannot control (seizure).  Having trouble with: ? Moving (coordination). ? Sleeping. ? Passing out (fainting). ? Getting upset easily (irritability). Low blood sugar can  happen to people who have diabetes and people who do not have diabetes. Low blood sugar can happen quickly, and it can be an emergency. Treating low blood sugar Low blood sugar is often treated by eating or drinking something sugary right away, such as:  Fruit juice, 4-6 oz (120-150 mL).  Regular soda (not diet soda), 4-6 oz (120-150 mL).  Low-fat milk, 4 oz (120 mL).  Several pieces of hard candy.  Sugar or honey, 1 Tbsp (15 mL). Treating low blood sugar if you have diabetes If you can think clearly and swallow safely, follow the 15:15 rule:  Take 15 grams of a fast-acting carb (carbohydrate). Talk with your doctor about how much you should take.  Always keep a source of fast-acting carb with you, such as: ? Sugar tablets (glucose pills). Take 3-4 pills. ? 6-8 pieces of hard candy. ? 4-6 oz (120-150 mL) of fruit  juice. ? 4-6 oz (120-150 mL) of regular (not diet) soda. ? 1 Tbsp (15 mL) honey or sugar.  Check your blood sugar 15 minutes after you take the carb.  If your blood sugar is still at or below 70 mg/dL (3.9 mmol/L), take 15 grams of a carb again.  If your blood sugar does not go above 70 mg/dL (3.9 mmol/L) after 3 tries, get help right away.  After your blood sugar goes back to normal, eat a meal or a snack within 1 hour.  Treating very low blood sugar If your blood sugar is at or below 54 mg/dL (3 mmol/L), you have very low blood sugar (severe hypoglycemia). This may also cause:  Passing out.  Jerky movements you cannot control (seizure).  Losing consciousness (coma). This is an emergency. Do not wait to see if the symptoms will go away. Get medical help right away. Call your local emergency services (911 in the [place] Do not drive yourself to the hospital. If you have very low blood sugar and you cannot eat or drink, you may need a glucagon shot (injection). A family member or friend should learn how to check your blood sugar and how to give you a glucagon shot. Ask your doctor if you need to have a glucagon shot kit at home. Follow these instructions at home: General instructions  Take over-the-counter and prescription medicines only as told by your doctor.  Stay aware of your blood sugar as told by your doctor.  Limit alcohol intake to no more than 1 drink a day for nonpregnant women and 2 drinks a day for men. One drink equals 12 oz of beer (355 mL), 5 oz of wine (148 mL), or 1 oz of hard liquor (44 mL).  Keep all follow-up visits as told by your doctor. This is important. If you have diabetes:   Follow your diabetes care plan as told by your doctor. Make sure you: ? Know the signs of low blood sugar. ? Take your medicines as told. ? Follow your exercise and meal plan. ? Eat on time. Do not skip meals. ? Check your blood sugar as often as told by your doctor. Always check  it before and after exercise. ? Follow your sick day plan when you cannot eat or drink normally. Make this plan ahead of time with your doctor.  Share your diabetes care plan with: ? Your work or school. ? People you live with.  Check your pee (urine) for ketones: ? When you are sick. ? As told by your doctor.  Carry a card or  wear jewelry that says you have diabetes. Contact a doctor if:  You have trouble keeping your blood sugar in your target range.  You have low blood sugar often. Get help right away if:  You still have symptoms after you eat or drink something sugary.  Your blood sugar is at or below 54 mg/dL (3 mmol/L).  You have jerky movements that you cannot control.  You pass out. These symptoms may be an emergency. Do not wait to see if the symptoms will go away. Get medical help right away. Call your local emergency services (911 in the [place] Do not drive yourself to the hospital. Summary  Hypoglycemia happens when the level of sugar (glucose) in your blood is too low.  Low blood sugar can happen to people who have diabetes and people who do not have diabetes. Low blood sugar can happen quickly, and it can be an emergency.  Make sure you know the signs of low blood sugar and know how to treat it.  Always keep a source of sugar (fast-acting carb) with you to treat low blood sugar. This information is not intended to replace advice given to you by your health care provider. Make sure you discuss any questions you have with your health care provider. Document Released: 11/22/2009 Document Revised: 02/19/2018 Document Reviewed: 10/01/2015 Elsevier Interactive Patient Education  2019 [employer].

## 2018-11-01 NOTE — H&P (Signed)
[facility] - [place] at [facility]   PATIENT NAME: [name] Gilbert    MR#:  [id number]  DATE OF BIRTH:  07/09/1961  DATE OF ADMISSION:  10/31/2018  PRIMARY CARE PHYSICIAN: Tobey GrimMcCravy, Matthew S, MD   REQUESTING/REFERRING PHYSICIAN: Roxan Hockeyobinson, MD  CHIEF COMPLAINT:   Chief Complaint  Patient presents with  . Altered Mental Status    HISTORY OF PRESENT ILLNESS:  [name] Gilbert  is a 58 y.o. female who presents with chief complaint as above.  Presents the ED after an episode of unresponsiveness and altered mental status.  When she arrived she was found to have glucose in the 30s.  She was given dextrose and her glucose came up and her symptoms resolved.  However, her glucose began to drift down again afterwards.  Patient denies taking too much insulin, however on chart review she is seen by endocrinology and per their last note she has had multiple episodes of hypoglycemia like this requiring hospitalization and they are unsure why this is occurring, but they suspect she could be accidentally taking too much insulin.  She required initiation of D10 infusion in the ED to keep her glucose levels up, and hospitalist were called for admission  PAST MEDICAL HISTORY:   Past Medical History:  Diagnosis Date  . Diabetes mellitus without complication (HCC)   . Hypertension   . Stroke (HCC)   . visual impaired      PAST SURGICAL HISTORY:   Past Surgical History:  Procedure Laterality Date  . ABDOMINAL HYSTERECTOMY    . TONSILLECTOMY       SOCIAL HISTORY:   Social History   Tobacco Use  . Smoking status: Never Smoker  . Smokeless tobacco: Never Used  Substance Use Topics  . Alcohol use: No     FAMILY HISTORY:   Family History  Problem Relation Age of Onset  . Diabetes Father      DRUG ALLERGIES:   Allergies  Allergen Reactions  . Penicillins Hives, Nausea And Vomiting and Swelling    Has patient had a PCN reaction causing immediate rash,  facial/tongue/throat swelling, SOB or lightheadedness with hypotension: Yes Has patient had a PCN reaction causing severe rash involving mucus membranes or skin necrosis: No Has patient had a PCN reaction that required hospitalization Yes Has patient had a PCN reaction occurring within the last 10 years: No If all of the above answers are "NO", then may proceed with Cephalosporin use.   . Codeine Other (See Comments)    Reaction: unknown  . Metformin Other (See Comments)    Reaction: unknown  . 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 Authorizing Provider  amLODipine (NORVASC) 10 MG tablet Take 10 mg by mouth daily. 10/28/18  Yes [provider]  atorvastatin (LIPITOR) 40 MG tablet Take 40 mg by mouth daily.   Yes [provider]  carvedilol (COREG) 12.5 MG tablet Take 25 mg by mouth 2 (two) times daily.    Yes [provider]  insulin aspart (NOVOLOG FLEXPEN) 100 UNIT/ML FlexPen Inject into the skin. 03/22/18  Yes [provider]  insulin degludec (TRESIBA) 100 UNIT/ML SOPN FlexTouch Pen Inject into the skin. 03/22/18  Yes [provider]  liraglutide (VICTOZA) 18 MG/3ML SOPN INJ 0.6 MG [place] QD 06/21/18  Yes [provider]  lisinopril-hydrochlorothiazide (PRINZIDE,ZESTORETIC) 20-25 MG tablet Take 1  tablet by mouth daily. 10/28/18 10/28/19 Yes [provider]    REVIEW OF SYSTEMS:  Review of Systems  Constitutional: Negative for chills, fever, malaise/fatigue and weight loss.  HENT: Negative for ear pain, hearing loss and tinnitus.   Eyes: Negative for blurred vision, double vision, pain and redness.  Respiratory: Negative for cough, hemoptysis and shortness of breath.   Cardiovascular: Negative for chest pain, palpitations, orthopnea and leg swelling.  Gastrointestinal: Negative for abdominal pain,  constipation, diarrhea, nausea and vomiting.  Genitourinary: Negative for dysuria, frequency and hematuria.  Musculoskeletal: Negative for back pain, joint pain and neck pain.  Skin:       No acne, rash, or lesions  Neurological: Negative for dizziness, tremors, focal weakness and weakness.  Endo/Heme/Allergies: Negative for polydipsia. Does not bruise/bleed easily.  Psychiatric/Behavioral: Negative for depression. The patient is not nervous/anxious and does not have insomnia.      VITAL SIGNS:   Vitals:   10/31/18 2230 10/31/18 2300 10/31/18 2330 11/01/18 0007  BP: (!) 151/82 129/70 128/64   Pulse: 71 66 67   Resp: 13 15 17    Temp:    97.9 F (36.6 C)  TempSrc:    Oral  SpO2: 100% 99% 99%   Weight:      Height:       Wt Readings from Last 3 Encounters:  10/31/18 72.6 kg  07/17/17 77.6 kg  08/21/16 83 kg    PHYSICAL EXAMINATION:  Physical Exam  Vitals reviewed. Constitutional: She is oriented to person, place, and time. She appears well-developed and well-nourished. No distress.  HENT:  Head: Normocephalic and atraumatic.  Mouth/Throat: Oropharynx is clear and moist.  Eyes: Pupils are equal, round, and reactive to light. Conjunctivae and EOM are normal. No scleral icterus.  Neck: Normal range of motion. Neck supple. No JVD present. No thyromegaly present.  Cardiovascular: Normal rate, regular rhythm and intact distal pulses. Exam reveals no gallop and no friction rub.  No murmur heard. Respiratory: Effort normal and breath sounds normal. No respiratory distress. She has no wheezes. She has no rales.  GI: Soft. Bowel sounds are normal. She exhibits no distension. There is no abdominal tenderness.  Musculoskeletal: Normal range of motion.        General: No edema.     Comments: No arthritis, no gout  Lymphadenopathy:    She has no cervical adenopathy.  Neurological: She is alert and oriented to person, place, and time. No cranial nerve deficit.  No dysarthria, no  aphasia  Skin: Skin is warm and dry. No rash noted. No erythema.  Psychiatric: She has a normal mood and affect. Her behavior is normal. Judgment and thought content normal.    LABORATORY PANEL:   CBC Recent Labs  Lab 10/31/18 1831  WBC 5.9  HGB 13.9  HCT 44.2  PLT 233   ------------------------------------------------------------------------------------------------------------------  Chemistries  Recent Labs  Lab 10/31/18 2028  NA 138  K 3.9  CL 107  CO2 25  GLUCOSE 154*  BUN 32*  CREATININE 1.30*  CALCIUM 8.3*  AST 66*  ALT 37  ALKPHOS 102  BILITOT 0.9   ------------------------------------------------------------------------------------------------------------------  Cardiac Enzymes No results for input(s): TROPONINI in the last 168 hours. ------------------------------------------------------------------------------------------------------------------  RADIOLOGY:  Dg Chest 2 View  Result Date: 10/31/2018 CLINICAL DATA:  58 year old female found with altered mental status, nonverbal. Fever. EXAM: CHEST - 2 VIEW COMPARISON:  10/28/2017 and earlier. FINDINGS: Seated AP and lateral views of the chest. Low lung volumes similar to the 2019  comparison. Normal cardiac size and mediastinal contours. Visualized tracheal air column is within normal limits. No pneumothorax, pleural effusion or consolidation. Mild increased pulmonary interstitial markings similar to those in 2019 which are probably crowding/atelectasis. No confluent pulmonary opacity. Negative visible bowel gas pattern. No acute osseous abnormality identified. IMPRESSION: Low lung volumes with suspected crowding of lung markings. No convincing acute cardiopulmonary abnormality. Electronically Signed   By: Odessa Fleming M.D.   On: 10/31/2018 19:07   Ct Head Wo Contrast  Result Date: 10/31/2018 CLINICAL DATA:  58 year old female found with altered mental status, nonverbal. Hypoglycemia. Fever. EXAM: CT HEAD WITHOUT  CONTRAST TECHNIQUE: Contiguous axial images were obtained from the base of the skull through the vertex without intravenous contrast. COMPARISON:  Head CTs 08/22/2016 and earlier. FINDINGS: Brain: Chronic encephalomalacia in the left parietal and occipital lobes. Ex vacuo enlargement of the left lateral ventricle atrium. Patchy chronic encephalomalacia also in the right superior parietal lobe. Chronic heterogeneous hypodensity in the left lentiform. Stable gray-white matter differentiation throughout the brain. No midline shift, ventriculomegaly, mass effect, evidence of mass lesion, intracranial hemorrhage or evidence of cortically based acute infarction. Vascular: Calcified atherosclerosis at the skull base. No suspicious intracranial vascular hyperdensity. Skull: Negative. Sinuses/Orbits: Visualized paranasal sinuses and mastoids are stable and well pneumatized. Other: Stable orbits and scalp. IMPRESSION: 1. No acute intracranial abnormality. 2. 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 Electronically Signed   By: Odessa Fleming M.D.   On: 10/31/2018 19:27    EKG:   Orders placed or performed during the hospital encounter of 10/31/18  . ED EKG 12-Lead  . ED EKG 12-Lead    IMPRESSION AND PLAN:  Principal Problem:   Hypoglycemia -unclear etiology for her hypoglycemic episodes, however this is a recurring thing with this patient.  D10 infusion for now is keeping her glucose levels up.  We will monitor her and wean the D10 as able Active Problems:   Diabetes (HCC) -hold all anti-glycemic's at this time, carb modified diet   HTN (hypertension) -continue home meds  Chart review performed and case discussed with ED provider. Labs, imaging and/or ECG reviewed by provider and discussed with patient/family. Management plans discussed with the patient and/or family.  DVT PROPHYLAXIS:  SubQ lovenox   GI PROPHYLAXIS:  None  ADMISSION STATUS: Observation  CODE STATUS: Full Code Status History    Date Active Date Inactive Code Status Order ID Comments User Context   08/22/2016 0507 08/23/2016 1549 Full Code [id number]  Ihor Austin, MD Inpatient   01/26/2016 1446 01/28/2016 1925 Full Code [id number]  Enedina Finner, MD ED      TOTAL TIME TAKING CARE OF THIS PATIENT: 40 minutes.   Barney Drain 11/01/2018, 12:48 AM  [facility]  Office  [phone]  CC: Primary care physician; Tobey Grim, MD  Note:  This document was prepared using Dragon voice recognition software and may include unintentional dictation errors.

## 2018-11-01 NOTE — ED Notes (Signed)
ED TO INPATIENT HANDOFF REPORT  Name/Age/Gender [name] Gilbert 58 y.o. female  Code Status    Code Status Orders  (From admission, onward)         Start     Ordered   11/01/18 0055  Full code  Continuous     11/01/18 0054        Code Status History    Date Active Date Inactive Code Status Order ID Comments User Context   08/22/2016 0507 08/23/2016 1549 Full Code [id number]  Ihor Austin, MD Inpatient   01/26/2016 1446 01/28/2016 1925 Full Code [id number]  Enedina Finner, MD ED      Home/SNF/Other Home  Chief Complaint unresponsive  Level of Care/Admitting Diagnosis ED Disposition    ED Disposition Condition Comment   Admit  Hospital Area: [facility] [[id number]]  Level of Care: Med-Surg [16]  Diagnosis: Hypoglycemia [782956]  Admitting Physician: Oralia Manis [[id number]]  Attending Physician: Oralia Manis [id number]  PT Class (Do Not Modify): Observation [104]  PT Acc Code (Do Not Modify): Observation [10022]       Medical History Past Medical History:  Diagnosis Date  . Diabetes mellitus without complication (HCC)   . Hypertension   . Stroke (HCC)   . visual impaired     Allergies Allergies  Allergen Reactions  . Penicillins Hives, Nausea And Vomiting and Swelling    Has patient had a PCN reaction causing immediate rash, facial/tongue/throat swelling, SOB or lightheadedness with hypotension: Yes Has patient had a PCN reaction causing severe rash involving mucus membranes or skin necrosis: No Has patient had a PCN reaction that required hospitalization Yes Has patient had a PCN reaction occurring within the last 10 years: No If all of the above answers are "NO", then may proceed with Cephalosporin use.   . Codeine Other (See Comments)    Reaction: unknown  . Metformin Other (See Comments)    Reaction: unknown  . 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    IV Location/Drains/Wounds Patient Lines/Drains/Airways  Status   Active Line/Drains/Airways    Name:   Placement date:   Placement time:   Site:   Days:   Peripheral IV 10/31/18 Right Hand   10/31/18    1827    Hand   1   Peripheral IV 10/31/18 Left Arm   10/31/18    2011    Arm   1   External Urinary Catheter   10/31/18    2235    -   1          Labs/Imaging Results for orders placed or performed during the hospital encounter of 10/31/18 (from the past 48 hour(s))  Glucose, capillary     Status: Abnormal   Collection Time: 10/31/18  6:18 PM  Result Value Ref Range   Glucose-Capillary 44 (LL) 70 - 99 mg/dL  Glucose, capillary     Status: Abnormal   Collection Time: 10/31/18  6:31 PM  Result Value Ref Range   Glucose-Capillary 165 (H) 70 - 99 mg/dL  CBC WITH DIFFERENTIAL     Status: None   Collection Time: 10/31/18  6:31 PM  Result Value Ref Range   WBC 5.9 4.0 - 10.5 K/uL   RBC 4.77 3.87 - 5.11 MIL/uL   Hemoglobin 13.9 12.0 - 15.0 g/dL   HCT 95.2 84.1 - 32.4 %   MCV 92.7 80.0 - 100.0 fL   MCH 29.1 26.0 - 34.0 pg   MCHC 31.4 30.0 -  36.0 g/dL   RDW 28.4 13.2 - 44.0 %   Platelets 233 150 - 400 K/uL   nRBC 0.0 0.0 - 0.2 %   Neutrophils Relative % 42 %   Neutro Abs 2.5 1.7 - 7.7 K/uL   Lymphocytes Relative 44 %   Lymphs Abs 2.7 0.7 - 4.0 K/uL   Monocytes Relative 11 %   Monocytes Absolute 0.6 0.1 - 1.0 K/uL   Eosinophils Relative 2 %   Eosinophils Absolute 0.1 0.0 - 0.5 K/uL   Basophils Relative 1 %   Basophils Absolute 0.0 0.0 - 0.1 K/uL   Immature Granulocytes 0 %   Abs Immature Granulocytes 0.01 0.00 - 0.07 K/uL    Comment: Performed at [facility], [address] [place], [place] [address]  Urinalysis, Complete w Microscopic     Status: Abnormal   Collection Time: 10/31/18  6:31 PM  Result Value Ref Range   Color, Urine COLORLESS (A) YELLOW   APPearance CLEAR (A) CLEAR   Specific Gravity, Urine 1.009 1.005 - 1.030   pH 6.0 5.0 - 8.0   Glucose, UA 150 (A) NEGATIVE mg/dL   Hgb urine dipstick SMALL (A)  NEGATIVE   Bilirubin Urine NEGATIVE NEGATIVE   Ketones, ur NEGATIVE NEGATIVE mg/dL   Protein, ur 30 (A) NEGATIVE mg/dL   Nitrite NEGATIVE NEGATIVE   Leukocytes,Ua NEGATIVE NEGATIVE   RBC / HPF 0-5 0 - 5 RBC/hpf   WBC, UA 0-5 0 - 5 WBC/hpf   Bacteria, UA RARE (A) NONE SEEN   Squamous Epithelial / LPF 0-5 0 - 5    Comment: Performed at [facility], [address], [place], [place] [address]  Influenza panel by PCR (type A & B)     Status: None   Collection Time: 10/31/18  6:31 PM  Result Value Ref Range   Influenza A By PCR NEGATIVE NEGATIVE   Influenza B By PCR NEGATIVE NEGATIVE    Comment: (NOTE) The Xpert Xpress Flu assay is intended as an aid in the diagnosis of  influenza and should not be used as a sole basis for treatment.  This  assay is FDA approved for nasopharyngeal swab specimens only. Nasal  washings and aspirates are unacceptable for Xpert Xpress Flu testing. Performed at [facility], [address] [place], [place] [address]   Glucose, capillary     Status: Abnormal   Collection Time: 10/31/18  7:49 PM  Result Value Ref Range   Glucose-Capillary 38 (LL) 70 - 99 mg/dL   Comment 1 Document in Chart   Procalcitonin - Baseline     Status: None   Collection Time: 10/31/18  8:26 PM  Result Value Ref Range   Procalcitonin <0.10 ng/mL    Comment:        Interpretation: PCT (Procalcitonin) <= 0.5 ng/mL: Systemic infection (sepsis) is not likely. Local bacterial infection is possible. (NOTE)       Sepsis PCT Algorithm           Lower Respiratory Tract                                      Infection PCT Algorithm    ----------------------------     ----------------------------         PCT < 0.25 ng/mL                PCT < 0.10 ng/mL  Strongly encourage             Strongly discourage   discontinuation of antibiotics    initiation of antibiotics    ----------------------------     -----------------------------       PCT 0.25 - 0.50 ng/mL             PCT 0.10 - 0.25 ng/mL               OR       >80% decrease in PCT            Discourage initiation of                                            antibiotics      Encourage discontinuation           of antibiotics    ----------------------------     -----------------------------         PCT >= 0.50 ng/mL              PCT 0.26 - 0.50 ng/mL               AND        <80% decrease in PCT             Encourage initiation of                                             antibiotics       Encourage continuation           of antibiotics    ----------------------------     -----------------------------        PCT >= 0.50 ng/mL                  PCT > 0.50 ng/mL               AND         increase in PCT                  Strongly encourage                                      initiation of antibiotics    Strongly encourage escalation           of antibiotics                                     -----------------------------                                           PCT <= 0.25 ng/mL                                                 OR                                        >  80% decrease in PCT                                     Discontinue / Do not initiate                                             antibiotics Performed at [facility], [address] [place], [place] [address]   Lactic acid, plasma     Status: None   Collection Time: 10/31/18  8:28 PM  Result Value Ref Range   Lactic Acid, Venous 1.5 0.5 - 1.9 mmol/L    Comment: Performed at [facility], [address] [place], [place] [address]  Comprehensive metabolic panel     Status: Abnormal   Collection Time: 10/31/18  8:28 PM  Result Value Ref Range   Sodium 138 135 - 145 mmol/L   Potassium 3.9 3.5 - 5.1 mmol/L   Chloride 107 98 - 111 mmol/L   CO2 25 22 - 32 mmol/L   Glucose, Bld 154 (H) 70 - 99 mg/dL   BUN 32 (H) 6 - 20 mg/dL   Creatinine, Ser 0.981.30 (H) 0.44 - 1.00 mg/dL   Calcium 8.3 (L) 8.9 -  10.3 mg/dL   Total Protein 8.4 (H) 6.5 - 8.1 g/dL   Albumin 3.8 3.5 - 5.0 g/dL   AST 66 (H) 15 - 41 U/L   ALT 37 0 - 44 U/L   Alkaline Phosphatase 102 38 - 126 U/L   Total Bilirubin 0.9 0.3 - 1.2 mg/dL   GFR calc non Af Amer 45 (L) >60 mL/min   GFR calc Af Amer 52 (L) >60 mL/min   Anion gap 6 5 - 15    Comment: Performed at [facility], [address] [place], [place] [address]  Glucose, capillary     Status: None   Collection Time: 10/31/18  8:58 PM  Result Value Ref Range   Glucose-Capillary 93 70 - 99 mg/dL  Glucose, capillary     Status: None   Collection Time: 10/31/18 10:00 PM  Result Value Ref Range   Glucose-Capillary 81 70 - 99 mg/dL  Glucose, capillary     Status: Abnormal   Collection Time: 10/31/18 11:35 PM  Result Value Ref Range   Glucose-Capillary 150 (H) 70 - 99 mg/dL  Glucose, capillary     Status: Abnormal   Collection Time: 11/01/18  2:37 AM  Result Value Ref Range   Glucose-Capillary 192 (H) 70 - 99 mg/dL   Dg Chest 2 View  Result Date: 10/31/2018 CLINICAL DATA:  58 year old female found with altered mental status, nonverbal. Fever. EXAM: CHEST - 2 VIEW COMPARISON:  10/28/2017 and earlier. FINDINGS: Seated AP and lateral views of the chest. Low lung volumes similar to the 2019 comparison. Normal cardiac size and mediastinal contours. Visualized tracheal air column is within normal limits. No pneumothorax, pleural effusion or consolidation. Mild increased pulmonary interstitial markings similar to those in 2019 which are probably crowding/atelectasis. No confluent pulmonary opacity. Negative visible bowel gas pattern. No acute osseous abnormality identified. IMPRESSION: Low lung volumes with suspected crowding of lung markings. No convincing acute cardiopulmonary abnormality. Electronically Signed   By: Odessa FlemingH  Hall M.D.   On: 10/31/2018 19:07   Ct Head Wo Contrast  Result Date: 10/31/2018  CLINICAL DATA:  58 year old female found with altered mental status,  nonverbal. Hypoglycemia. Fever. EXAM: CT HEAD WITHOUT CONTRAST TECHNIQUE: Contiguous axial images were obtained from the base of the skull through the vertex without intravenous contrast. COMPARISON:  Head CTs 08/22/2016 and earlier. FINDINGS: Brain: Chronic encephalomalacia in the left parietal and occipital lobes. Ex vacuo enlargement of the left lateral ventricle atrium. Patchy chronic encephalomalacia also in the right superior parietal lobe. Chronic heterogeneous hypodensity in the left lentiform. Stable gray-white matter differentiation throughout the brain. No midline shift, ventriculomegaly, mass effect, evidence of mass lesion, intracranial hemorrhage or evidence of cortically based acute infarction. Vascular: Calcified atherosclerosis at the skull base. No suspicious intracranial vascular hyperdensity. Skull: Negative. Sinuses/Orbits: Visualized paranasal sinuses and mastoids are stable and well pneumatized. Other: Stable orbits and scalp. IMPRESSION: 1. No acute intracranial abnormality. 2. 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 Electronically Signed   By: Odessa Fleming M.D.   On: 10/31/2018 19:27    Pending Labs Unresulted Labs (From admission, onward)    Start     Ordered   11/08/18 0500  Creatinine, serum  (enoxaparin (LOVENOX)    CrCl >/= 30 ml/min)  Weekly,   STAT    Comments:  while on enoxaparin therapy    11/01/18 0054   11/01/18 0500  Basic metabolic panel  Tomorrow morning,   STAT     11/01/18 0054   11/01/18 0500  CBC  Tomorrow morning,   STAT     11/01/18 0054   10/31/18 1834  Blood Culture (routine x 2)  BLOOD CULTURE X 2,   STAT     10/31/18 1833   10/31/18 1834  Urine culture  ONCE - STAT,   STAT     10/31/18 1833   Signed and Held  HIV antibody (Routine Testing)  Once,   R     Signed and Held   Signed and Held  CBC  (enoxaparin (LOVENOX)    CrCl >/=  30 ml/min)  Once,   R    Comments:  Baseline for enoxaparin therapy IF NOT ALREADY DRAWN.  Notify MD if PLT < 100 K.    Signed and Held   Signed and Held  Creatinine, serum  (enoxaparin (LOVENOX)    CrCl >/= 30 ml/min)  Once,   R    Comments:  Baseline for enoxaparin therapy IF NOT ALREADY DRAWN.    Signed and Held          Vitals/Pain Today's Vitals   11/01/18 0030 11/01/18 0100 11/01/18 0130 11/01/18 0200  BP: 136/73 134/65 129/70 128/68  Pulse: 68 67 66 67  Resp: 12 20 13 16   Temp:      TempSrc:      SpO2: 98% 98% 99% 99%  Weight:      Height:        Isolation Precautions No active isolations  Medications Medications  lidocaine (PF) (XYLOCAINE) 1 % injection (has no administration in time range)  dextrose 10 % infusion ( Intravenous New Bag/Given 10/31/18 2102)  acetaminophen (TYLENOL) tablet 650 mg (has no administration in time range)    Or  acetaminophen (TYLENOL) suppository 650 mg (has no administration in time range)  ondansetron (ZOFRAN) tablet 4 mg (has no administration in time range)    Or  ondansetron (ZOFRAN) injection 4 mg (has no administration in time range)  dextrose 50 % solution  50 mL (50 mLs Intravenous Given 10/31/18 1829)  dextrose 50 % solution 50 mL (50 mLs Intravenous Given 10/31/18 1953)  sodium chloride 0.9 % bolus 500 mL (0 mLs Intravenous Stopped 10/31/18 2243)    Mobility walks

## 2018-11-01 NOTE — ED Notes (Signed)
POCT  CBP Q4hr per admitting MD Anne Hahn

## 2018-11-02 LAB — HIV ANTIBODY (ROUTINE TESTING W REFLEX): HIV Screen 4th Generation wRfx: NONREACTIVE

## 2018-11-05 LAB — CULTURE, BLOOD (ROUTINE X 2)
Culture: NO GROWTH
Culture: NO GROWTH

## 2019-03-24 IMAGING — DX DG CHEST 1V PORT
1 series · 1 of 1 positions shown · non-contrast
Comparison: 01/26/2016

CLINICAL DATA: Hypoglycemia

EXAM:
PORTABLE CHEST 1 VIEW

[chest ap]
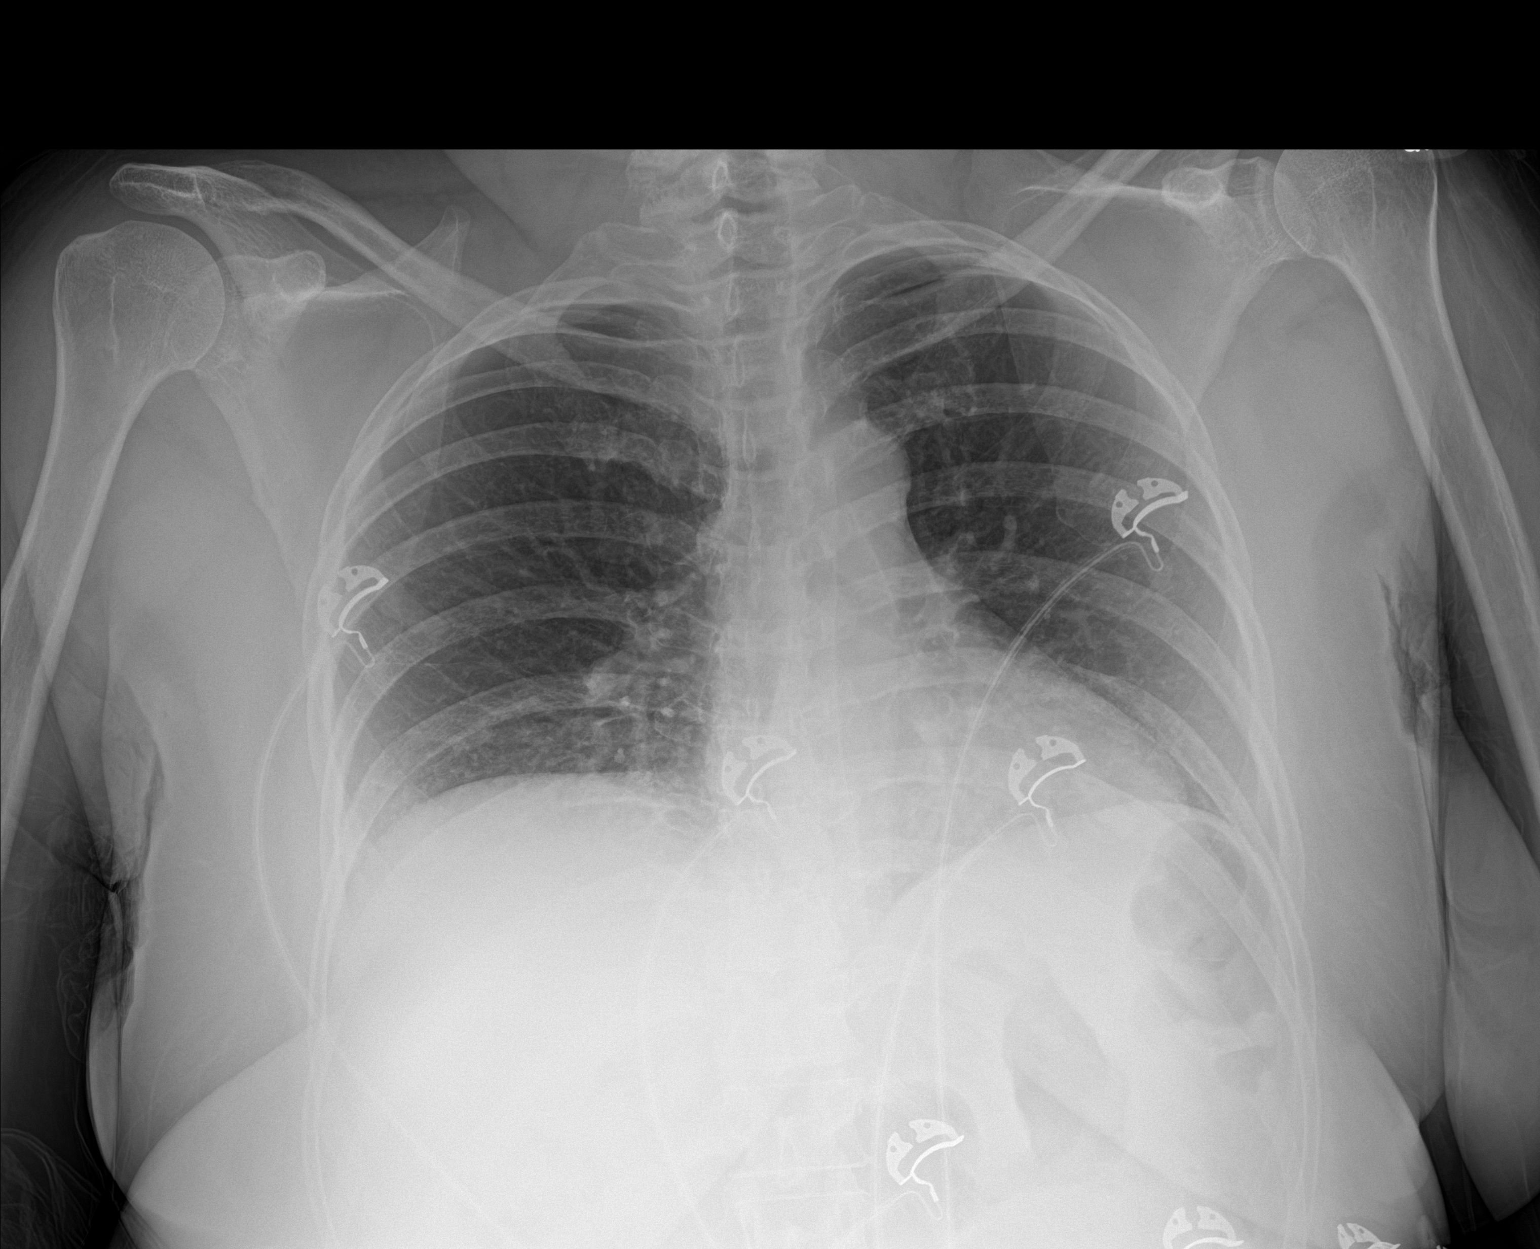

[1 of 1 positions shown; findings below may reference images not displayed]

FINDINGS: The heart size and mediastinal contours are within normal limits.
Lung volumes are low with crowding of interstitial lung markings. No
pneumonic consolidation, CHF, effusion or pneumothorax. The
visualized skeletal structures are unremarkable.
IMPRESSION: No active disease.

## 2019-10-01 ENCOUNTER — Inpatient Hospital Stay: Payer: Medicare HMO

## 2019-10-01 ENCOUNTER — Emergency Department: Payer: Medicare HMO

## 2019-10-01 ENCOUNTER — Inpatient Hospital Stay
Admission: EM | Admit: 2019-10-01 | Discharge: 2019-10-05 | DRG: 637 | Disposition: A | Discharge status: Home / Self Care | Payer: Medicare HMO | Attending: Internal Medicine | Admitting: Internal Medicine

## 2019-10-01 ENCOUNTER — Other Ambulatory Visit: Payer: Self-pay

## 2019-10-01 DIAGNOSIS — J9600 Acute respiratory failure, unspecified whether with hypoxia or hypercapnia: Secondary | ICD-10-CM | POA: Diagnosis present

## 2019-10-01 DIAGNOSIS — Z781 Physical restraint status: Secondary | ICD-10-CM

## 2019-10-01 DIAGNOSIS — Z9119 Patient's noncompliance with other medical treatment and regimen: Secondary | ICD-10-CM

## 2019-10-01 DIAGNOSIS — Z794 Long term (current) use of insulin: Secondary | ICD-10-CM

## 2019-10-01 DIAGNOSIS — J690 Pneumonitis due to inhalation of food and vomit: Secondary | ICD-10-CM | POA: Diagnosis present

## 2019-10-01 DIAGNOSIS — E1101 Type 2 diabetes mellitus with hyperosmolarity with coma: Secondary | ICD-10-CM | POA: Diagnosis not present

## 2019-10-01 DIAGNOSIS — Z20822 Contact with and (suspected) exposure to covid-19: Secondary | ICD-10-CM | POA: Diagnosis present

## 2019-10-01 DIAGNOSIS — Z833 Family history of diabetes mellitus: Secondary | ICD-10-CM

## 2019-10-01 DIAGNOSIS — Z8673 Personal history of transient ischemic attack (TIA), and cerebral infarction without residual deficits: Secondary | ICD-10-CM | POA: Diagnosis not present

## 2019-10-01 DIAGNOSIS — H547 Unspecified visual loss: Secondary | ICD-10-CM | POA: Diagnosis present

## 2019-10-01 DIAGNOSIS — N179 Acute kidney failure, unspecified: Secondary | ICD-10-CM | POA: Diagnosis present

## 2019-10-01 DIAGNOSIS — Z79899 Other long term (current) drug therapy: Secondary | ICD-10-CM | POA: Diagnosis not present

## 2019-10-01 DIAGNOSIS — E876 Hypokalemia: Secondary | ICD-10-CM | POA: Diagnosis not present

## 2019-10-01 DIAGNOSIS — J9601 Acute respiratory failure with hypoxia: Secondary | ICD-10-CM | POA: Diagnosis present

## 2019-10-01 DIAGNOSIS — E1100 Type 2 diabetes mellitus with hyperosmolarity without nonketotic hyperglycemic-hyperosmolar coma (NKHHC): Secondary | ICD-10-CM | POA: Diagnosis present

## 2019-10-01 DIAGNOSIS — Z880 Allergy status to penicillin: Secondary | ICD-10-CM | POA: Diagnosis not present

## 2019-10-01 DIAGNOSIS — Z90710 Acquired absence of both cervix and uterus: Secondary | ICD-10-CM | POA: Diagnosis not present

## 2019-10-01 DIAGNOSIS — F0280 Dementia in other diseases classified elsewhere without behavioral disturbance: Secondary | ICD-10-CM | POA: Diagnosis present

## 2019-10-01 DIAGNOSIS — G9341 Metabolic encephalopathy: Secondary | ICD-10-CM | POA: Diagnosis present

## 2019-10-01 DIAGNOSIS — E1165 Type 2 diabetes mellitus with hyperglycemia: Secondary | ICD-10-CM | POA: Diagnosis present

## 2019-10-01 DIAGNOSIS — Z885 Allergy status to narcotic agent status: Secondary | ICD-10-CM | POA: Diagnosis not present

## 2019-10-01 DIAGNOSIS — I10 Essential (primary) hypertension: Secondary | ICD-10-CM | POA: Diagnosis present

## 2019-10-01 DIAGNOSIS — Z452 Encounter for adjustment and management of vascular access device: Secondary | ICD-10-CM

## 2019-10-01 LAB — CBC WITH DIFFERENTIAL/PLATELET
Abs Immature Granulocytes: 0.03 10*3/uL (ref 0.00–0.07)
Basophils Absolute: 0 10*3/uL (ref 0.0–0.1)
Basophils Relative: 0 %
Eosinophils Absolute: 0 10*3/uL (ref 0.0–0.5)
Eosinophils Relative: 1 %
HCT: 38.4 % (ref 36.0–46.0)
Hemoglobin: 12.9 g/dL (ref 12.0–15.0)
Immature Granulocytes: 0 %
Lymphocytes Relative: 49 %
Lymphs Abs: 3.2 10*3/uL (ref 0.7–4.0)
MCH: 28.8 pg (ref 26.0–34.0)
MCHC: 33.6 g/dL (ref 30.0–36.0)
MCV: 85.7 fL (ref 80.0–100.0)
Monocytes Absolute: 0.4 10*3/uL (ref 0.1–1.0)
Monocytes Relative: 6 %
Neutro Abs: 2.9 10*3/uL (ref 1.7–7.7)
Neutrophils Relative %: 44 %
Platelets: 210 10*3/uL (ref 150–400)
RBC: 4.48 MIL/uL (ref 3.87–5.11)
RDW: 12.5 % (ref 11.5–15.5)
WBC: 6.7 10*3/uL (ref 4.0–10.5)
nRBC: 0 % (ref 0.0–0.2)

## 2019-10-01 LAB — URINALYSIS, COMPLETE (UACMP) WITH MICROSCOPIC
Bacteria, UA: NONE SEEN
Bilirubin Urine: NEGATIVE
Glucose, UA: >=500 mg/dL — AB
Ketones, ur: NEGATIVE mg/dL
Leukocytes,Ua: NEGATIVE
Nitrite: NEGATIVE
Protein, ur: 30 mg/dL — AB
Specific Gravity, Urine: 1.018 (ref 1.005–1.030)
Squamous Epithelial / HPF: NONE SEEN (ref 0–5)
pH: 7 (ref 5.0–8.0)

## 2019-10-01 LAB — COMPREHENSIVE METABOLIC PANEL
ALT: 22 U/L (ref 0–44)
AST: 32 U/L (ref 15–41)
Albumin: 3.5 g/dL (ref 3.5–5.0)
Alkaline Phosphatase: 168 U/L — ABNORMAL HIGH (ref 38–126)
Anion gap: 12 (ref 5–15)
BUN: 30 mg/dL — ABNORMAL HIGH (ref 6–20)
CO2: 22 mmol/L (ref 22–32)
Calcium: 8.5 mg/dL — ABNORMAL LOW (ref 8.9–10.3)
Chloride: 90 mmol/L — ABNORMAL LOW (ref 98–111)
Creatinine, Ser: 1.87 mg/dL — ABNORMAL HIGH (ref 0.44–1.00)
GFR calc Af Amer: 33 mL/min — ABNORMAL LOW (ref >60)
GFR calc non Af Amer: 29 mL/min — ABNORMAL LOW (ref >60)
Glucose, Bld: 1030 mg/dL (ref 70–99)
Potassium: 4.4 mmol/L (ref 3.5–5.1)
Sodium: 124 mmol/L — ABNORMAL LOW (ref 135–145)
Total Bilirubin: 0.7 mg/dL (ref 0.3–1.2)
Total Protein: 8.5 g/dL — ABNORMAL HIGH (ref 6.5–8.1)

## 2019-10-01 LAB — BLOOD GAS, VENOUS
Acid-base deficit: 1.9 mmol/L (ref 0.0–2.0)
Bicarbonate: 24.3 mmol/L (ref 20.0–28.0)
O2 Saturation: 81.1 %
Patient temperature: 37
pCO2, Ven: 46 mmHg (ref 44.0–60.0)
pH, Ven: 7.33 (ref 7.250–7.430)
pO2, Ven: 49 mmHg — ABNORMAL HIGH (ref 32.0–45.0)

## 2019-10-01 LAB — RESPIRATORY PANEL BY RT PCR (FLU A&B, COVID)
Influenza A by PCR: NEGATIVE
Influenza B by PCR: NEGATIVE
SARS Coronavirus 2 by RT PCR: NEGATIVE

## 2019-10-01 LAB — GLUCOSE, CAPILLARY
Glucose-Capillary: >600 mg/dL (ref 70–99)
Glucose-Capillary: >600 mg/dL (ref 70–99)

## 2019-10-01 LAB — TROPONIN I (HIGH SENSITIVITY): Troponin I (High Sensitivity): 16 ng/L (ref <18)

## 2019-10-01 LAB — BETA-HYDROXYBUTYRIC ACID: Beta-Hydroxybutyric Acid: 0.18 mmol/L (ref 0.05–0.27)

## 2019-10-01 LAB — LACTIC ACID, PLASMA: Lactic Acid, Venous: 4.5 mmol/L (ref 0.5–1.9)

## 2019-10-01 MED ORDER — SODIUM CHLORIDE 0.9 % IV SOLN
1.0000 g | Freq: Two times a day (BID) | INTRAVENOUS | Status: DC
  Filled 2019-10-01 (×2): qty 1

## 2019-10-01 MED ORDER — PROPOFOL 1000 MG/100ML IV EMUL
INTRAVENOUS | Status: AC
  Filled 2019-10-01: qty 100

## 2019-10-01 MED ORDER — DEXTROSE-NACL 5-0.45 % IV SOLN: INTRAVENOUS | Status: DC

## 2019-10-01 MED ORDER — SODIUM CHLORIDE 0.9 % IV SOLN
1000.0000 mL | Freq: Once | INTRAVENOUS | Status: AC
  Administered 2019-10-01: 21:00:00 1000 mL via INTRAVENOUS

## 2019-10-01 MED ORDER — SUCCINYLCHOLINE CHLORIDE 20 MG/ML IJ SOLN
INTRAMUSCULAR | Status: DC | PRN
  Administered 2019-10-01: 100 mg via INTRAVENOUS

## 2019-10-01 MED ORDER — SODIUM CHLORIDE 0.9 % IV SOLN: INTRAVENOUS | Status: DC

## 2019-10-01 MED ORDER — ACETAMINOPHEN 325 MG PO TABS: 650.0000 mg | ORAL_TABLET | ORAL | Status: DC | PRN

## 2019-10-01 MED ORDER — LORAZEPAM 2 MG/ML IJ SOLN
1.0000 mg | Freq: Once | INTRAMUSCULAR | Status: AC
  Administered 2019-10-01: 21:00:00 1 mg via INTRAVENOUS

## 2019-10-01 MED ORDER — FENTANYL CITRATE (PF) 100 MCG/2ML IJ SOLN
50.0000 ug | INTRAMUSCULAR | Status: DC | PRN
  Administered 2019-10-02: 01:00:00 100 ug via INTRAVENOUS
  Filled 2019-10-01 (×2): qty 2

## 2019-10-01 MED ORDER — INSULIN REGULAR(HUMAN) IN NACL 100-0.9 UT/100ML-% IV SOLN
INTRAVENOUS | Status: DC
  Administered 2019-10-01: 22:00:00 5 [IU]/h via INTRAVENOUS
  Filled 2019-10-01: qty 100

## 2019-10-01 MED ORDER — HEPARIN SODIUM (PORCINE) 5000 UNIT/ML IJ SOLN
5000.0000 [IU] | Freq: Three times a day (TID) | INTRAMUSCULAR | Status: DC
  Administered 2019-10-02 – 2019-10-05 (×11): 5000 [IU] via SUBCUTANEOUS
  Filled 2019-10-01 (×11): qty 1

## 2019-10-01 MED ORDER — DEXTROSE 50 % IV SOLN
0.0000 mL | INTRAVENOUS | Status: DC | PRN
  Administered 2019-10-03: 16:00:00 50 mL via INTRAVENOUS
  Administered 2019-10-03 – 2019-10-04 (×3): 25 mL via INTRAVENOUS
  Filled 2019-10-01 (×3): qty 50

## 2019-10-01 MED ORDER — FENTANYL CITRATE (PF) 100 MCG/2ML IJ SOLN: 50.0000 ug | INTRAMUSCULAR | Status: DC | PRN

## 2019-10-01 MED ORDER — MIDAZOLAM HCL 2 MG/2ML IJ SOLN
2.0000 mg | INTRAMUSCULAR | Status: DC | PRN
  Administered 2019-10-02: 01:00:00 2 mg via INTRAVENOUS
  Filled 2019-10-01: qty 2

## 2019-10-01 MED ORDER — POTASSIUM CHLORIDE 10 MEQ/100ML IV SOLN
10.0000 meq | INTRAVENOUS | Status: AC
  Administered 2019-10-02 (×2): 10 meq via INTRAVENOUS
  Filled 2019-10-01 (×2): qty 100

## 2019-10-01 MED ORDER — INSULIN ASPART 100 UNIT/ML ~~LOC~~ SOLN
10.0000 [IU] | Freq: Once | SUBCUTANEOUS | Status: AC
  Administered 2019-10-01: 22:00:00 10 [IU] via INTRAVENOUS
  Filled 2019-10-01: qty 1

## 2019-10-01 MED ORDER — ONDANSETRON HCL 4 MG/2ML IJ SOLN: 4.0000 mg | Freq: Four times a day (QID) | INTRAMUSCULAR | Status: DC | PRN

## 2019-10-01 MED ORDER — FAMOTIDINE IN NACL 20-0.9 MG/50ML-% IV SOLN
20.0000 mg | Freq: Two times a day (BID) | INTRAVENOUS | Status: DC
  Administered 2019-10-02 – 2019-10-05 (×7): 20 mg via INTRAVENOUS
  Filled 2019-10-01 (×7): qty 50

## 2019-10-01 MED ORDER — PROPOFOL 1000 MG/100ML IV EMUL
5.0000 ug/kg/min | INTRAVENOUS | Status: DC
  Administered 2019-10-02: 06:00:00 70 ug/kg/min via INTRAVENOUS
  Administered 2019-10-02: 10:00:00 50 ug/kg/min via INTRAVENOUS
  Filled 2019-10-01 (×3): qty 100

## 2019-10-01 MED ORDER — INSULIN REGULAR(HUMAN) IN NACL 100-0.9 UT/100ML-% IV SOLN
INTRAVENOUS | Status: DC
  Administered 2019-10-02: 08:00:00 1.3 [IU]/h via INTRAVENOUS

## 2019-10-01 MED ORDER — ETOMIDATE 2 MG/ML IV SOLN
INTRAVENOUS | Status: DC | PRN
  Administered 2019-10-01: 20 mg via INTRAVENOUS

## 2019-10-01 MED ORDER — LABETALOL HCL 5 MG/ML IV SOLN
10.0000 mg | INTRAVENOUS | Status: DC | PRN
  Administered 2019-10-04 – 2019-10-05 (×4): 10 mg via INTRAVENOUS
  Filled 2019-10-01 (×4): qty 4

## 2019-10-01 MED ORDER — SODIUM CHLORIDE 0.9 % IV SOLN
1.0000 g | INTRAVENOUS | Status: AC
  Administered 2019-10-02: 01:00:00 1 g via INTRAVENOUS
  Filled 2019-10-01: qty 1

## 2019-10-01 MED ORDER — DEXMEDETOMIDINE HCL IN NACL 400 MCG/100ML IV SOLN: 0.4000 ug/kg/h | INTRAVENOUS | Status: DC

## 2019-10-01 MED ORDER — MIDAZOLAM HCL 2 MG/2ML IJ SOLN: 2.0000 mg | INTRAMUSCULAR | Status: DC | PRN

## 2019-10-01 MED ORDER — IPRATROPIUM-ALBUTEROL 0.5-2.5 (3) MG/3ML IN SOLN: 3.0000 mL | RESPIRATORY_TRACT | Status: DC | PRN
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
## 2019-10-01 NOTE — Progress Notes (Signed)
pts medication list verified by spouse. Last doses were unknown.

## 2019-10-01 NOTE — ED Notes (Signed)
Date and time results received: 10/01/19 2135   (use smartphrase ".now" to insert current time)  Test: lactic/glucose Critical Value: 4.5/1,030  Name of Provider Notified: williams  Orders Received? Or Actions Taken?: Orders Received - See Orders for details

## 2019-10-01 NOTE — ED Triage Notes (Signed)
Pt arrives via EMS from home with husband. Pt arrives non verbal but alert, pt combative on arrival, having to be physically restrained by ems. MD williams at bediside, orders for wrist and ankle restraints. Pt has fixed gaze to right side. Hx stroke, moving all extremities, no known deficits from previous stroke. Per husband pt was verbal prior to transportation to hospital. Husband states this has happened before when pts sugar is high, cbg reading high at this time

## 2019-10-01 NOTE — ED Notes (Signed)
Lab called for redraw.

## 2019-10-01 NOTE — H&P (Signed)
Name: [name] Gilbert MRN: [id number] DOB: January 08, 1961    ADMISSION DATE:  10/01/2019 CONSULTATION DATE:  10/01/2019  REFERRING MD : Dr. Jimmye Norman  CHIEF COMPLAINT: Altered mental status  BRIEF PATIENT DESCRIPTION:  59 year old female admitted 10/01/2019 with altered mental status secondary to  Hyperosmolar hyperglycemic state requiring insulin drip.  While in ED she had episode of vomiting with concern for aspiration and inability to maintain her airway, of which she was subsequently intubated in the ED.  SIGNIFICANT EVENTS  1/20-presented to ED 1/20- vomiting with concern for aspiration while in ED requiring intubation 1/20- Admission to ICU  STUDIES:  1/20-CT head>>Moderate motion degradation limits evaluation. No evidence of acute intracranial abnormality.Unchanged chronic infarcts within the bilateral parietal lobes and left occipital lobe. Redemonstrated chronic small-vessel ischemic changes within the left deep gray nuclei. 1/20-chest x-ray>>No acute abnormalities.  CULTURES: SARS-CoV-2 PCR 1/20>> Influenza PCR 1/20>> Tracheal Aspirate 1/20>>  ANTIBIOTICS: Meropenem 1/20>>  LINES/TUBES: ETT 1/20>> Left IJ CVC 1/21>>  HISTORY OF PRESENT ILLNESS:   [name] Gilbert is a 59 year old female with a past medical history notable for diabetes mellitus, hypertension, CVA, and visual impairment who presented to [facility] ED on 10/01/2019 due to altered mental status and combativeness.  Patient is currently intubated and sedated and no family present at bedside, therefore history is obtained from ED and nursing notes.  Initial work-up in the ED revealed glucose 1030, BUN 30, creatinine 1.87, sodium 124, lactic acid 4.5, beta hydroxybutyric acid 0.18.  Urinalysis negative for ketones, and negative for UTI.  Chest x-ray was normal, and CT head negative for any acute intracranial abnormalities.  Her COVID-19 PCR is negative, influenza PCR is negative.  She was to be admitted to ICU for further  work-up and treatment of altered mental status in the setting of hyperosmolar hyperglycemic state.    However while awaiting bed placement in the ED, she became increasingly altered with vomiting, concerning for aspiration and inability to maintain her airway.  She was subsequently intubated in the ED.  PCCM is consulted to admit.  PAST MEDICAL HISTORY :   has a past medical history of Diabetes mellitus without complication ([place]), Hypertension, Stroke ([place]), and visual impaired.  has a past surgical history that includes Abdominal hysterectomy and Tonsillectomy. Prior to Admission medications   Medication Sig Start Date End Date Taking? Authorizing Provider  amLODipine (NORVASC) 10 MG tablet Take 10 mg by mouth daily. 09/28/19  Yes [provider]  atorvastatin (LIPITOR) 40 MG tablet Take 40 mg by mouth daily.   Yes [provider]  DULoxetine (CYMBALTA) 20 MG capsule Take 60 mg by mouth daily. 04/21/19  Yes [provider]  insulin aspart (NOVOLOG FLEXPEN) 100 UNIT/ML FlexPen Inject 2 Units into the skin 3 (three) times daily with meals. 11/01/18  Yes Wieting, Richard, MD  insulin degludec (TRESIBA FLEXTOUCH) 100 UNIT/ML SOPN FlexTouch Pen Inject 30 Units into the skin daily. 08/11/19  Yes [provider]  liraglutide (VICTOZA) 18 MG/3ML SOPN Inject 0.6 mg into the skin daily. 02/05/19  Yes [provider]  lisinopril-hydrochlorothiazide (PRINZIDE,ZESTORETIC) 20-25 MG tablet Take 1 tablet by mouth daily. 10/28/18 10/28/19 Yes [provider]   Allergies  Allergen Reactions  . Penicillins Hives, Nausea And Vomiting and Swelling    Has patient had a PCN reaction causing immediate rash, facial/tongue/throat swelling, SOB or lightheadedness with hypotension: Yes Has patient had a PCN reaction causing severe rash involving mucus membranes or skin necrosis: No Has patient had a PCN reaction that required  hospitalization Yes Has patient had a PCN  reaction occurring within the last 10 years: No If all of the above answers are "NO", then may proceed with Cephalosporin use.   . Codeine Other (See Comments)    Reaction: unknown  . Metformin Other (See Comments)    Reaction: unknown  . 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 SOCIAL HISTORY:  reports that she has never smoked. She has never used smokeless tobacco. She reports that she does not drink alcohol or use drugs.   COVID-19 DISASTER DECLARATION:  FULL CONTACT PHYSICAL EXAMINATION WAS NOT POSSIBLE DUE TO TREATMENT OF COVID-19 AND  CONSERVATION OF PERSONAL PROTECTIVE EQUIPMENT, LIMITED EXAM FINDINGS INCLUDE-  Patient assessed or the symptoms described in the history of present illness.  In the context of the Global COVID-19 pandemic, which necessitated consideration that the patient might be at risk for infection with the SARS-CoV-2 virus that causes COVID-19, Institutional protocols and algorithms that pertain to the evaluation of patients at risk for COVID-19 are in a state of rapid change based on information released by regulatory bodies including the CDC and federal and state organizations. These policies and algorithms were followed during the patient's care while in hospital.  REVIEW OF SYSTEMS:   Unable to assess due to intubation and critical illness  SUBJECTIVE:  Unable to assess due to intubation and critical illness  VITAL SIGNS: Pulse Rate:  [100-114] 100 (01/20 2045) Resp:  [16] 16 (01/20 2045) BP: (163)/(93) 163/93 (01/20 2045) SpO2:  [98 %] 98 % (01/20 2036) Weight:  [90.7 kg] 90.7 kg (01/20 2037)  PHYSICAL EXAMINATION: General: Acutely ill-appearing female, laying in bed, intubated and sedated, no acute distress Neuro: Sedated, intermittent agitation, withdraws from pain with purposeful movement, does not follow commands HEENT: Atraumatic, normocephalic, neck supple,  no JVD, pupils PERRLA Cardiovascular: Tachycardia, regular rhythm, S1-S2, no murmurs, rubs, or gallops, 2+ pulses Lungs: Clear to auscultation bilaterally, no wheezing or rails, vent assisted, even, nonlabored Abdomen: Soft, nontender, nondistended, no guarding or rebound tenderness, bowel sounds positive x4 Musculoskeletal: No deformities, normal bulk and tone, no edema Skin: Warm and dry, no obvious rashes, lesions, or ulcerations  Recent Labs  Lab 10/01/19 2038  NA 124*  K 4.4  CL 90*  CO2 22  BUN 30*  CREATININE 1.87*  GLUCOSE 1,030*   Recent Labs  Lab 10/01/19 2038  HGB 12.9  HCT 38.4  WBC 6.7  PLT 210   DG Chest 1 View  Result Date: 10/01/2019 CLINICAL DATA:  Combative, altered mental status, stroke EXAM: CHEST  1 VIEW COMPARISON:  Portable exam 2026 hours compared to 10/31/2018 FINDINGS: Normal heart size, mediastinal contours, and pulmonary vascularity. Lungs clear. No pulmonary infiltrate, pleural effusion or pneumothorax. Bones unremarkable. IMPRESSION: No acute abnormalities. Electronically Signed   By: Lavonia Dana M.D.   On: 10/01/2019 20:56   CT Head Wo Contrast  Result Date: 10/01/2019 CLINICAL DATA:  Encephalopathy. Additional history provided: Patient arrives from home nonverbal but alert, combative, fixed EAC the right, history of stroke, moving all extremities EXAM: CT HEAD WITHOUT CONTRAST TECHNIQUE: Contiguous axial images were obtained from the base of the skull through the vertex without intravenous contrast. COMPARISON:  Head CT 10/31/2018 FINDINGS: Brain: The examination is moderately motion degraded, limiting evaluation. Within this limitation, no acute intracranial hemorrhage or acute demarcated cortical infarction is identified. Redemonstrated chronic encephalomalacia within the left parietal and occipital  lobes with associated ex vacuo dilatation of the posterior left lateral ventricle. Redemonstrated patchy chronic encephalomalacia within the superior  right parietal lobe. Chronic heterogeneity within the left lentiform nucleus. Mild generalized parenchymal atrophy. There is no evidence of intracranial mass. No midline shift or extra-axial fluid collection. Vascular: No hyperdense vessel is identified. Atherosclerotic calcifications. Skull: Within the limitations of motion degradation, no calvarial fracture or suspicious osseous lesion is identified. Sinuses/Orbits: Rightward gaze. No significant paranasal sinus disease or mastoid effusion at the imaged levels. IMPRESSION: Moderate motion degradation limits evaluation. No evidence of acute intracranial abnormality. Unchanged chronic infarcts within the bilateral parietal lobes and left occipital lobe. Redemonstrated chronic small-vessel ischemic changes within the left deep gray nuclei. Electronically Signed   By: Kellie Simmering DO   On: 10/01/2019 21:36    ASSESSMENT / PLAN:  Hyperosmolar Hyperglycemic State Hx: DM -Follow HHS protocol -Aggressive IV fluids -Insulin drip -Follow BMP every 4 hours -Once Serum Osmolality <315, can convert to SSI and Long acting insulin -Check hemoglobin A1c -Consult diabetes coordinator, appreciate input  Concern for aspiration and inability to maintain airway -Intubated in ED -Full vent support -Wean FiO2 and PEEP as tolerated -Follow intermittent ABG and chest x-ray as needed -Spontaneous breathing trials when respiratory parameters met and mentation permits -As needed bronchodilators -Will place on empiric Meropenem (pt with severe PCN allergy)  ? aspiration -Monitor fever curve -Trend WBCs and procalcitonin -Follow cultures as above -Will place on empiric meropenem for now (patient with severe penicillin allergy)  AKI Hypertonic Hyponatremia (corrected Na given glucose of 1030 is Na 139) Lactic acidosis -Monitor I&O's / urinary output -Follow BMP -Ensure adequate renal perfusion -Avoid nephrotoxic agents as able -Replace electrolytes as  indicated -IV Fluids -Trend lactic acid until normalized  Acute Metabolic Encephalopathy in setting of HHS Sedation needs in setting of mechanical ventilation Hx: CVA, visual impairment -Correct HHS -Maintain RASS 0 to -1 -Propfol gtt to maintain RASS goal -Avoid sedating meds as able -Daily wake up assessment -Provide supportive care -CT Head negative for acute intracranial abnormality -Check Urine drug screen            DISPOSITION: ICU GOALS OF CARE: Full code VTE PROPHYLAXIS: SQ Heparin SUP PROPHYLAXIS: Pepcid UPDATES: Pt intubated and sedated, No family at bedside for update during NP rounds 10/01/2019  Darel Hong, [facility] [place] [facility] Pager: [phone]  10/01/2019, 10:02 PM

## 2019-10-01 NOTE — ED Notes (Signed)
Prop inititated at at this time

## 2019-10-01 NOTE — Code Documentation (Signed)
Restraints removed.

## 2019-10-02 ENCOUNTER — Inpatient Hospital Stay: Payer: Medicare HMO

## 2019-10-02 LAB — BASIC METABOLIC PANEL
Anion gap: 8 (ref 5–15)
Anion gap: 7 (ref 5–15)
BUN: 25 mg/dL — ABNORMAL HIGH (ref 6–20)
BUN: 26 mg/dL — ABNORMAL HIGH (ref 6–20)
CO2: 24 mmol/L (ref 22–32)
CO2: 25 mmol/L (ref 22–32)
Calcium: 8 mg/dL — ABNORMAL LOW (ref 8.9–10.3)
Calcium: 7.8 mg/dL — ABNORMAL LOW (ref 8.9–10.3)
Chloride: 106 mmol/L (ref 98–111)
Chloride: 106 mmol/L (ref 98–111)
Creatinine, Ser: 1.56 mg/dL — ABNORMAL HIGH (ref 0.44–1.00)
Creatinine, Ser: 1.32 mg/dL — ABNORMAL HIGH (ref 0.44–1.00)
GFR calc Af Amer: 41 mL/min — ABNORMAL LOW (ref >60)
GFR calc Af Amer: 51 mL/min — ABNORMAL LOW (ref >60)
GFR calc non Af Amer: 36 mL/min — ABNORMAL LOW (ref >60)
GFR calc non Af Amer: 44 mL/min — ABNORMAL LOW (ref >60)
Glucose, Bld: 185 mg/dL — ABNORMAL HIGH (ref 70–99)
Glucose, Bld: 214 mg/dL — ABNORMAL HIGH (ref 70–99)
Potassium: 3.9 mmol/L (ref 3.5–5.1)
Potassium: 4.2 mmol/L (ref 3.5–5.1)
Sodium: 138 mmol/L (ref 135–145)
Sodium: 138 mmol/L (ref 135–145)

## 2019-10-02 LAB — BLOOD GAS, ARTERIAL
Acid-base deficit: 0.3 mmol/L (ref 0.0–2.0)
Bicarbonate: 24.1 mmol/L (ref 20.0–28.0)
FIO2: 0.5
MECHVT: 430 mL
O2 Saturation: 99.8 %
PEEP: 5 cmH2O
Patient temperature: 37
RATE: 16 resp/min
pCO2 arterial: 38 mmHg (ref 32.0–48.0)
pH, Arterial: 7.41 (ref 7.350–7.450)
pO2, Arterial: 242 mmHg — ABNORMAL HIGH (ref 83.0–108.0)

## 2019-10-02 LAB — TROPONIN I (HIGH SENSITIVITY)
Troponin I (High Sensitivity): 98 ng/L — ABNORMAL HIGH (ref <18)
Troponin I (High Sensitivity): 99 ng/L — ABNORMAL HIGH (ref <18)
Troponin I (High Sensitivity): 118 ng/L (ref <18)

## 2019-10-02 LAB — GLUCOSE, CAPILLARY
Glucose-Capillary: 116 mg/dL — ABNORMAL HIGH (ref 70–99)
Glucose-Capillary: 151 mg/dL — ABNORMAL HIGH (ref 70–99)
Glucose-Capillary: 160 mg/dL — ABNORMAL HIGH (ref 70–99)
Glucose-Capillary: 161 mg/dL — ABNORMAL HIGH (ref 70–99)
Glucose-Capillary: 164 mg/dL — ABNORMAL HIGH (ref 70–99)
Glucose-Capillary: 169 mg/dL — ABNORMAL HIGH (ref 70–99)
Glucose-Capillary: 171 mg/dL — ABNORMAL HIGH (ref 70–99)
Glucose-Capillary: 176 mg/dL — ABNORMAL HIGH (ref 70–99)
Glucose-Capillary: 180 mg/dL — ABNORMAL HIGH (ref 70–99)
Glucose-Capillary: 186 mg/dL — ABNORMAL HIGH (ref 70–99)
Glucose-Capillary: 186 mg/dL — ABNORMAL HIGH (ref 70–99)
Glucose-Capillary: 216 mg/dL — ABNORMAL HIGH (ref 70–99)
Glucose-Capillary: 344 mg/dL — ABNORMAL HIGH (ref 70–99)
Glucose-Capillary: 419 mg/dL — ABNORMAL HIGH (ref 70–99)
Glucose-Capillary: >600 mg/dL (ref 70–99)

## 2019-10-02 LAB — URINE DRUG SCREEN, QUALITATIVE (ARMC ONLY)
Amphetamines, Ur Screen: NOT DETECTED
Barbiturates, Ur Screen: NOT DETECTED
Benzodiazepine, Ur Scrn: NOT DETECTED
Cannabinoid 50 Ng, Ur ~~LOC~~: NOT DETECTED
Cocaine Metabolite,Ur ~~LOC~~: NOT DETECTED
MDMA (Ecstasy)Ur Screen: NOT DETECTED
Methadone Scn, Ur: NOT DETECTED
Opiate, Ur Screen: NOT DETECTED
Phencyclidine (PCP) Ur S: NOT DETECTED
Tricyclic, Ur Screen: NOT DETECTED

## 2019-10-02 LAB — PROCALCITONIN
Procalcitonin: <0.1 ng/mL
Procalcitonin: <0.1 ng/mL

## 2019-10-02 LAB — OSMOLALITY
Osmolality: 311 mOsm/kg — ABNORMAL HIGH (ref 275–295)
Osmolality: 303 mOsm/kg — ABNORMAL HIGH (ref 275–295)

## 2019-10-02 LAB — CBC
HCT: 34.9 % — ABNORMAL LOW (ref 36.0–46.0)
Hemoglobin: 12 g/dL (ref 12.0–15.0)
MCH: 28.9 pg (ref 26.0–34.0)
MCHC: 34.4 g/dL (ref 30.0–36.0)
MCV: 84.1 fL (ref 80.0–100.0)
Platelets: 237 10*3/uL (ref 150–400)
RBC: 4.15 MIL/uL (ref 3.87–5.11)
RDW: 12.2 % (ref 11.5–15.5)
WBC: 13.5 10*3/uL — ABNORMAL HIGH (ref 4.0–10.5)
nRBC: 0 % (ref 0.0–0.2)

## 2019-10-02 LAB — LACTIC ACID, PLASMA
Lactic Acid, Venous: 4.2 mmol/L (ref 0.5–1.9)
Lactic Acid, Venous: 2.6 mmol/L (ref 0.5–1.9)
Lactic Acid, Venous: 2 mmol/L (ref 0.5–1.9)

## 2019-10-02 LAB — MRSA PCR SCREENING: MRSA by PCR: NEGATIVE

## 2019-10-02 LAB — HEMOGLOBIN A1C
Hgb A1c MFr Bld: 13.6 % — ABNORMAL HIGH (ref 4.8–5.6)
Mean Plasma Glucose: 343.62 mg/dL

## 2019-10-02 LAB — TRIGLYCERIDES: Triglycerides: 125 mg/dL (ref <150)

## 2019-10-02 MED ORDER — SODIUM CHLORIDE 0.9 % IV SOLN: INTRAVENOUS | Status: DC

## 2019-10-02 MED ORDER — INSULIN ASPART 100 UNIT/ML ~~LOC~~ SOLN
0.0000 [IU] | SUBCUTANEOUS | Status: DC
  Administered 2019-10-02 (×3): 3 [IU] via SUBCUTANEOUS
  Administered 2019-10-03 (×2): 2 [IU] via SUBCUTANEOUS
  Filled 2019-10-02 (×5): qty 1

## 2019-10-02 MED ORDER — SODIUM CHLORIDE 0.9 % IV SOLN
2.0000 g | INTRAVENOUS | Status: DC
  Administered 2019-10-02: 12:00:00 2 g via INTRAVENOUS
  Filled 2019-10-02 (×2): qty 20

## 2019-10-02 MED ORDER — LACTATED RINGERS IV BOLUS
1000.0000 mL | Freq: Once | INTRAVENOUS | Status: AC
  Administered 2019-10-02: 20:00:00 1000 mL via INTRAVENOUS

## 2019-10-02 MED ORDER — INSULIN DETEMIR 100 UNIT/ML ~~LOC~~ SOLN
20.0000 [IU] | Freq: Every day | SUBCUTANEOUS | Status: DC
  Administered 2019-10-02 – 2019-10-03 (×2): 20 [IU] via SUBCUTANEOUS
  Filled 2019-10-02 (×2): qty 0.2

## 2019-10-02 MED ORDER — DEXMEDETOMIDINE HCL IN NACL 400 MCG/100ML IV SOLN
0.4000 ug/kg/h | INTRAVENOUS | Status: DC
  Administered 2019-10-02 – 2019-10-03 (×2): 0.5 ug/kg/h via INTRAVENOUS
  Filled 2019-10-02 (×2): qty 100

## 2019-10-02 MED ORDER — FENTANYL CITRATE (PF) 100 MCG/2ML IJ SOLN: 50.0000 ug | Freq: Once | INTRAMUSCULAR | Status: DC

## 2019-10-02 MED ORDER — FENTANYL 2500MCG IN NS 250ML (10MCG/ML) PREMIX INFUSION
50.0000 ug/h | INTRAVENOUS | Status: DC
  Administered 2019-10-02: 06:00:00 100 ug/h via INTRAVENOUS
  Filled 2019-10-02: qty 250

## 2019-10-02 MED ORDER — CHLORHEXIDINE GLUCONATE CLOTH 2 % EX PADS
6.0000 | MEDICATED_PAD | Freq: Every day | CUTANEOUS | Status: DC
  Administered 2019-10-02 – 2019-10-05 (×3): 6 via TOPICAL

## 2019-10-02 MED ORDER — SODIUM CHLORIDE 0.9 % IV BOLUS
1000.0000 mL | Freq: Once | INTRAVENOUS | Status: AC
  Administered 2019-10-02: 03:00:00 1000 mL via INTRAVENOUS

## 2019-10-02 MED ORDER — ORAL CARE MOUTH RINSE
15.0000 mL | OROMUCOSAL | Status: DC
  Administered 2019-10-02 – 2019-10-03 (×12): 15 mL via OROMUCOSAL

## 2019-10-02 MED ORDER — CHLORHEXIDINE GLUCONATE 0.12% ORAL RINSE (MEDLINE KIT)
15.0000 mL | Freq: Two times a day (BID) | OROMUCOSAL | Status: DC
  Administered 2019-10-02 – 2019-10-05 (×5): 15 mL via OROMUCOSAL

## 2019-10-02 MED ORDER — FENTANYL BOLUS VIA INFUSION
50.0000 ug | INTRAVENOUS | Status: DC | PRN
  Filled 2019-10-02: qty 50

## 2019-10-02 MED ORDER — NOREPINEPHRINE 16 MG/250ML-% IV SOLN
0.0000 ug/min | INTRAVENOUS | Status: DC
  Administered 2019-10-02: 20:00:00 1 ug/min via INTRAVENOUS
  Administered 2019-10-02: 03:00:00 2 ug/min via INTRAVENOUS
  Filled 2019-10-02: qty 250

## 2019-10-02 NOTE — Progress Notes (Signed)
Called and updated pt's spouse [name] of pt's status, that she required intubation in ED due to concern for aspiration and airway protection.  Updated him that HHS has resolved, and that we are continuing to treat her for potential aspiration.  All questions answered.   Harlon Ditty, AGACNP-BC [place] [facility] Pager: [phone]

## 2019-10-02 NOTE — Progress Notes (Signed)
Inpatient Diabetes Program Recommendations  AACE/ADA: New Consensus Statement on Inpatient Glycemic Control   Target Ranges:  Prepandial:   less than 140 mg/dL      Peak postprandial:   less than 180 mg/dL (1-2 hours)      Critically ill patients:  140 - 180 mg/dL   Results for [name] Gilbert, [name] Gilbert (MRN [id number]) as of 10/02/2019 09:08  Ref. Range 10/02/2019 00:52 10/02/2019 02:07 10/02/2019 03:08 10/02/2019 04:10 10/02/2019 05:08 10/02/2019 06:27 10/02/2019 07:06 10/02/2019 07:57  Glucose-Capillary Latest Ref Range: 70 - 99 mg/dL 409 (H) 811 (H) 914 (H) 180 (H) 169 (H) 186 (H) 186 (H) 176 (H)  Results for [name] Gilbert, [name] Gilbert (MRN [id number]) as of 10/02/2019 09:08  Ref. Range 10/01/2019 20:38  Beta-Hydroxybutyric Acid Latest Ref Range: 0.05 - 0.27 mmol/L 0.18  Glucose Latest Ref Range: 70 - 99 mg/dL 0,865 (HH)   Review of Glycemic Control  Diabetes history: DM2 Outpatient Diabetes medications: Tresiba 30 units daily, Novolog 2 units TID with meals, Victoza 0.6 mg daily Current orders for Inpatient glycemic control: IV insulin; transitioning to Levemir 20 units daily, Novolog 0-15 units Q4H  NOTE: Noted consult for Diabetes Coordinator. Chart reviewed. Patient is currently ordered IV insulin and has already received Levemir 20 units at 8:17 am today for transitioning to SQ insulin. Patient is currently on the ventilator. Will continue to follow and make further recommendations if needed based on glycemic trends.   Thanks, Orlando Penner, RN, MSN, CDE Diabetes Coordinator Inpatient Diabetes Program [phone] (Team Pager from 8am to 5pm)

## 2019-10-02 NOTE — Progress Notes (Signed)
Name: [name] Gilbert MRN: [id number] DOB: 11-07-1960    ADMISSION DATE:  10/01/2019 CONSULTATION DATE:  10/01/2019  REFERRING MD : Dr. Jimmye Norman  CHIEF COMPLAINT: Altered mental status  BRIEF PATIENT DESCRIPTION:  59 year old female admitted 10/01/2019 with altered mental status secondary to  Hyperosmolar hyperglycemic state requiring insulin drip.  While in ED she had episode of vomiting with concern for aspiration and inability to maintain her airway, of which she was subsequently intubated in the ED.  SIGNIFICANT EVENTS  1/20-presented to ED 1/20- vomiting with concern for aspiration while in ED requiring intubation 1/20- Admission to ICU  STUDIES:  1/20-CT head>>Moderate motion degradation limits evaluation. No evidence of acute intracranial abnormality.Unchanged chronic infarcts within the bilateral parietal lobes and left occipital lobe. Redemonstrated chronic small-vessel ischemic changes within the left deep gray nuclei. 1/20-chest x-ray>>No acute abnormalities.  CULTURES: SARS-CoV-2 PCR 1/20>> Influenza PCR 1/20>> Tracheal Aspirate 1/20>>  ANTIBIOTICS: Meropenem 1/20>>Rocephin   LINES/TUBES: ETT 1/20>> Left IJ CVC 1/21>>  HISTORY OF PRESENT ILLNESS:   [name] Gilbert is a 59 year old female with a past medical history notable for diabetes mellitus, hypertension, CVA, and visual impairment who presented to [facility] ED on 10/01/2019 due to altered mental status and combativeness.  Patient is currently intubated and sedated and no family present at bedside, therefore history is obtained from ED and nursing notes.  Initial work-up in the ED revealed glucose 1030, BUN 30, creatinine 1.87, sodium 124, lactic acid 4.5, beta hydroxybutyric acid 0.18.  Urinalysis negative for ketones, and negative for UTI.  Chest x-ray was normal, and CT head negative for any acute intracranial abnormalities.  Her COVID-19 PCR is negative, influenza PCR is negative.  She was to be admitted to ICU for  further work-up and treatment of altered mental status in the setting of hyperosmolar hyperglycemic state.    However while awaiting bed placement in the ED, she became increasingly altered with vomiting, concerning for aspiration and inability to maintain her airway.  She was subsequently intubated in the ED.  PCCM is consulted to admit.  PAST MEDICAL HISTORY :   has a past medical history of Diabetes mellitus without complication ([place]), Hypertension, Stroke ([place]), and visual impaired.  has a past surgical history that includes Abdominal hysterectomy and Tonsillectomy. Prior to Admission medications   Medication Sig Start Date End Date Taking? Authorizing Provider  amLODipine (NORVASC) 10 MG tablet Take 10 mg by mouth daily. 09/28/19  Yes [provider]  atorvastatin (LIPITOR) 40 MG tablet Take 40 mg by mouth daily.   Yes [provider]  DULoxetine (CYMBALTA) 20 MG capsule Take 60 mg by mouth daily. 04/21/19  Yes [provider]  insulin aspart (NOVOLOG FLEXPEN) 100 UNIT/ML FlexPen Inject 2 Units into the skin 3 (three) times daily with meals. 11/01/18  Yes Wieting, Richard, MD  insulin degludec (TRESIBA FLEXTOUCH) 100 UNIT/ML SOPN FlexTouch Pen Inject 30 Units into the skin daily. 08/11/19  Yes [provider]  liraglutide (VICTOZA) 18 MG/3ML SOPN Inject 0.6 mg into the skin daily. 02/05/19  Yes [provider]  lisinopril-hydrochlorothiazide (PRINZIDE,ZESTORETIC) 20-25 MG tablet Take 1 tablet by mouth daily. 10/28/18 10/28/19 Yes [provider]   Allergies  Allergen Reactions  . Penicillins Hives, Nausea And Vomiting and Swelling    Has patient had a PCN reaction causing immediate rash, facial/tongue/throat swelling, SOB or lightheadedness with hypotension: Yes Has patient had a PCN reaction causing severe rash involving mucus membranes or skin necrosis: No Has patient had a PCN reaction that  required hospitalization Yes Has patient had a  PCN reaction occurring within the last 10 years: No If all of the above answers are "NO", then may proceed with Cephalosporin use.   . Codeine Other (See Comments)    Reaction: unknown  . Metformin Other (See Comments)    Reaction: unknown  . 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 SOCIAL HISTORY:  reports that she has never smoked. She has never used smokeless tobacco. She reports that she does not drink alcohol or use drugs.    REVIEW OF SYSTEMS:   Unable to assess due to intubation and critical illness  SUBJECTIVE:  Unable to assess due to intubation and critical illness  VITAL SIGNS: Temp:  [97.6 F (36.4 C)-99.8 F (37.7 C)] 99 F (37.2 C) (01/21 0800) Pulse Rate:  [73-116] 84 (01/21 0700) Resp:  [14-23] 16 (01/21 0700) BP: (82-174)/(60-141) 105/63 (01/21 0700) SpO2:  [98 %-100 %] 100 % (01/21 0700) FiO2 (%):  [24 %-50 %] 24 % (01/21 0816) Weight:  [80 kg-90.7 kg] 80 kg (01/21 0500)  PHYSICAL EXAMINATION: General: Acutely ill-appearing female, laying in bed, intubated and sedated, no acute distress Neuro: Sedated, intermittent agitation, withdraws from pain with purposeful movement, does not follow commands HEENT: Atraumatic, normocephalic, neck supple, no JVD, pupils PERRLA Cardiovascular: Tachycardia, regular rhythm, S1-S2, no murmurs, rubs, or gallops, 2+ pulses Lungs: Clear to auscultation bilaterally, no wheezing or rails, vent assisted, even, nonlabored Abdomen: Soft, nontender, nondistended, no guarding or rebound tenderness, bowel sounds positive x4 Musculoskeletal: No deformities, normal bulk and tone, no edema Skin: Warm and dry, no obvious rashes, lesions, or ulcerations  Recent Labs  Lab 10/01/19 2038 10/02/19 0240 10/02/19 0645  NA 124* 138 138  K 4.4 3.9 4.2  CL 90* 106 106  CO2 '22 24 25  ' BUN 30* 25* 26*  CREATININE 1.87* 1.56* 1.32*  GLUCOSE 1,030* 185* 214*    Recent Labs  Lab 10/01/19 2038 10/02/19 0511  HGB 12.9 12.0  HCT 38.4 34.9*  WBC 6.7 13.5*  PLT 210 237   DG Chest 1 View  Result Date: 10/01/2019 CLINICAL DATA:  Tube placement EXAM: CHEST  1 VIEW COMPARISON:  09/30/2018 FINDINGS: Interval intubation, tip of the endotracheal tube is about 1.5 cm superior to the carina. Esophageal tube is looped over the gastric fundus. Low lung volumes. No focal airspace disease, pleural effusion or pneumothorax. Normal heart size IMPRESSION: 1. Endotracheal tube tip about 1.5 cm superior to carina 2. Esophageal tube tip overlies the gastric fundus. 3. Clear lung fields Electronically Signed   By: Donavan Foil M.D.   On: 10/01/2019 22:56   DG Chest 1 View  Result Date: 10/01/2019 CLINICAL DATA:  Combative, altered mental status, stroke EXAM: CHEST  1 VIEW COMPARISON:  Portable exam 2026 hours compared to 10/31/2018 FINDINGS: Normal heart size, mediastinal contours, and pulmonary vascularity. Lungs clear. No pulmonary infiltrate, pleural effusion or pneumothorax. Bones unremarkable. IMPRESSION: No acute abnormalities. Electronically Signed   By: Lavonia Dana M.D.   On: 10/01/2019 20:56   CT Head Wo Contrast  Result Date: 10/01/2019 CLINICAL DATA:  Encephalopathy. Additional history provided: Patient arrives from home nonverbal but alert, combative, fixed EAC the right, history of stroke, moving all extremities EXAM: CT HEAD WITHOUT CONTRAST TECHNIQUE: Contiguous axial images were obtained from the base of the skull through the vertex without intravenous contrast. COMPARISON:  Head CT 10/31/2018 FINDINGS:  Brain: The examination is moderately motion degraded, limiting evaluation. Within this limitation, no acute intracranial hemorrhage or acute demarcated cortical infarction is identified. Redemonstrated chronic encephalomalacia within the left parietal and occipital lobes with associated ex vacuo dilatation of the posterior left lateral ventricle.  Redemonstrated patchy chronic encephalomalacia within the superior right parietal lobe. Chronic heterogeneity within the left lentiform nucleus. Mild generalized parenchymal atrophy. There is no evidence of intracranial mass. No midline shift or extra-axial fluid collection. Vascular: No hyperdense vessel is identified. Atherosclerotic calcifications. Skull: Within the limitations of motion degradation, no calvarial fracture or suspicious osseous lesion is identified. Sinuses/Orbits: Rightward gaze. No significant paranasal sinus disease or mastoid effusion at the imaged levels. IMPRESSION: Moderate motion degradation limits evaluation. No evidence of acute intracranial abnormality. Unchanged chronic infarcts within the bilateral parietal lobes and left occipital lobe. Redemonstrated chronic small-vessel ischemic changes within the left deep gray nuclei. Electronically Signed   By: Kellie Simmering DO   On: 10/01/2019 21:36   DG Chest Port 1 View  Result Date: 10/02/2019 CLINICAL DATA:  Central line placement EXAM: PORTABLE CHEST 1 VIEW COMPARISON:  October 01, 2019 FINDINGS: The endotracheal tube terminates approximately 3.5 cm above the carina. There is a well-positioned left IJ central venous catheter with tip terminating near the cavoatrial junction. The enteric tube terminates over the gastric body. The lungs are essentially clear. There is no evidence for pneumothorax. No large pleural effusion. The heart size is normal. IMPRESSION: 1. Lines and tubes as above. 2. No pneumothorax. 3. The lungs are clear. Electronically Signed   By: Constance Holster M.D.   On: 10/02/2019 02:36    ASSESSMENT / PLAN:  Hyperosmolar Hyperglycemic State Hx: DM -Follow HHS protocol- off of insulin drip now  -Aggressive IV fluids -Insulin drip -Follow BMP every 4 hours -Once Serum Osmolality <315, can convert to SSI and Long acting insulin -Check hemoglobin A1c -Consult diabetes coordinator, appreciate  input  Aspiration pneumonia  -Intubated in ED -Full vent support -Wean FiO2 and PEEP as tolerated -Follow intermittent ABG and chest x-ray as needed -Spontaneous breathing trials when respiratory parameters met and mentation permits -As needed bronchodilators -changing abx for Rocephin IV -weaning trial now for liberation from MV   AKI Hypertonic Hyponatremia (corrected Na given glucose of 1030 is Na 139) Lactic acidosis -Monitor I&O's / urinary output -Follow BMP -Ensure adequate renal perfusion -Avoid nephrotoxic agents as able -Replace electrolytes as indicated -IV Fluids -Trend lactic acid until normalized  Acute Metabolic Encephalopathy in setting of HHS Sedation needs in setting of mechanical ventilation Hx: CVA, visual impairment -Correct HHS -Maintain RASS 0 to -1 -Propfol gtt to maintain RASS goal -Avoid sedating meds as able -Daily wake up assessment -Provide supportive care -CT Head negative for acute intracranial abnormality -Check Urine drug screen   DISPOSITION: ICU GOALS OF CARE: Full code VTE PROPHYLAXIS: SQ Heparin SUP PROPHYLAXIS: Pepcid UPDATES: Pt intubated and sedated, No family at bedside for update during NP rounds 10/01/2019   Critical care provider statement:    Critical care time (minutes):  33   Critical care time was exclusive of:  Separately billable procedures and  treating other patients   Critical care was necessary to treat or prevent imminent or  life-threatening deterioration of the following conditions:  aspiration pneumonia, hyperosmolar non ketotic hypeglycemia, multiple comorbid conditions   Critical care was time spent personally by me on the following  activities:  Development of treatment plan with patient or surrogate,  discussions with consultants, evaluation of  patient's response to  treatment, examination of patient, obtaining history from patient or  surrogate, ordering and performing treatments and interventions,  ordering  and review of laboratory studies and re-evaluation of patient's condition   I assumed direction of critical care for this patient from another  provider in my specialty: no      Ottie Glazier, M.D.  [facility]

## 2019-10-02 NOTE — Progress Notes (Signed)
Propofol and fentanyl dose decreased by 50% for vent wean.

## 2019-10-02 NOTE — Progress Notes (Signed)
Patient moving around in bed but does not follow any simple commands.  Sedation remains off.

## 2019-10-02 NOTE — Progress Notes (Signed)
Pharmacy Antibiotic Note  [name] Gilbert is a 59 y.o. female admitted on 10/01/2019 with pneumonia.  Pharmacy has been consulted for Meropenem dosing.  Plan: Meropenem 1gm IV q12hrs  Height: 5\' 8"  (172.7 cm) Weight: 200 lb (90.7 kg) IBW/kg (Calculated) : 63.9  Temp (24hrs), Avg:98.6 F (37 C), Min:97.6 F (36.4 C), Max:99.8 F (37.7 C)  Recent Labs  Lab 10/01/19 2037 10/01/19 2038  WBC  --  6.7  CREATININE  --  1.87*  LATICACIDVEN 4.5*  --     Estimated Creatinine Clearance: 37.7 mL/min (A) (by C-G formula based on SCr of 1.87 mg/dL (H)).    Allergies  Allergen Reactions  . Penicillins Hives, Nausea And Vomiting and Swelling    Has patient had a PCN reaction causing immediate rash, facial/tongue/throat swelling, SOB or lightheadedness with hypotension: Yes Has patient had a PCN reaction causing severe rash involving mucus membranes or skin necrosis: No Has patient had a PCN reaction that required hospitalization Yes Has patient had a PCN reaction occurring within the last 10 years: No If all of the above answers are "NO", then may proceed with Cephalosporin use.   . Codeine Other (See Comments)    Reaction: unknown  . Metformin Other (See Comments)    Reaction: unknown  . 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  2/21 A 10/02/2019 1:52 AM

## 2019-10-02 NOTE — Progress Notes (Signed)
Patient has put out 125 ml of urine from foley. Dr. Karna Christmas notified and no orders obtained.

## 2019-10-02 NOTE — Progress Notes (Signed)
Initial Nutrition Assessment  DOCUMENTATION CODES:   Not applicable  INTERVENTION:   If tube feeds initiated, recommend:  Vital HP @20ml /hr + PS 71ml QID via tube   Free water flushes 37ml q4 hours to maintain tube patency   Propofol: 27.1 ml/hr- provides 715kcal/day   Regimen provides 1595kca/day, 102g/day protein, 574ml/day free water  Liquid MVI daily via tube   Pt likely at refeed risk; recommend monitor K, Mg and P labs daily until stable   NUTRITION DIAGNOSIS:   Inadequate oral intake related to inability to eat(pt sedated and ventilated) as evidenced by NPO status.  GOAL:   Provide needs based on ASPEN/SCCM guidelines  MONITOR:   Vent status, Labs, Weight trends, Skin, I & O's  REASON FOR ASSESSMENT:   Ventilator    ASSESSMENT:   59 year old female with a past medical history notable for diabetes mellitus, hypertension, CVA, and visual impairment who presented to [facility] ED on 10/01/2019 due to altered mental status and combativeness secondary to hyperosmolar hyperglycemia.   Pt sedated and ventilated for airway protection in setting of pt vomiting. OGT in place. No plans for tube feeds today. Per chart, pt appears fairly weight stable pta. Suspect pt with good appetite and oral intake at baseline.   Medications reviewed and include: heparin, insulin, NaCl @100ml /hr, ceftriaxone, pepcid, levophed, propofol, fentanyl   Labs reviewed: BUN 26(H), creat 1.32(H) Wbc- 13.5(H) cbgs- 186, 176, 151, 171, 160 x 24 hrs AIC 13.6(H)- 1/21  Patient is currently intubated on ventilator support MV: 6.9 L/min Temp (24hrs), Avg:98.7 F (37.1 C), Min:97.6 F (36.4 C), Max:99.8 F (37.7 C)  Propofol: 27.1 ml/hr- provides 715kcal/day   MAP- >31mmHg  UOP- 2/21   NUTRITION - FOCUSED PHYSICAL EXAM:    Most Recent Value  Orbital Region  No depletion  Upper Arm Region  No depletion  Thoracic and Lumbar Region  No depletion  Buccal Region  No depletion  Temple Region   No depletion  Clavicle Bone Region  No depletion  Clavicle and Acromion Bone Region  No depletion  Scapular Bone Region  No depletion  Dorsal Hand  No depletion  Patellar Region  No depletion  Anterior Thigh Region  No depletion  Posterior Calf Region  No depletion  Edema (RD Assessment)  None  Hair  Reviewed  Eyes  Reviewed  Mouth  Reviewed  Skin  Reviewed  Nails  Reviewed     Diet Order:   Diet Order            Diet NPO time specified  Diet effective now             EDUCATION NEEDS:   No education needs have been identified at this time  Skin:  Skin Assessment: Reviewed RN Assessment(ecchymosis)  Last BM:  pta  Height:   Ht Readings from Last 1 Encounters:  10/01/19 5\' 8"  (1.727 m)    Weight:   Wt Readings from Last 1 Encounters:  10/02/19 80 kg    Ideal Body Weight:  63.6 kg  BMI:  Body mass index is 26.82 kg/m.  Estimated Nutritional Needs:   Kcal:  1663kcal/day  Protein:  95-105g/day  Fluid:  1.9L/day  10/03/19 MS, RD, LDN Pager #- [phone] Office#- [phone] After Hours Pager: [phone]

## 2019-10-02 NOTE — Procedures (Signed)
Central Venous Catheter Insertion Procedure Note Ninette Cotta [id number] June 07, 1961  Procedure: Insertion of Central Venous Catheter Indications: Assessment of intravascular volume, Drug and/or fluid administration and Frequent blood sampling  Procedure Details Consent: Unable to obtain consent because of emergent medical necessity. Time Out: Verified patient identification, verified procedure, site/side was marked, verified correct patient position, special equipment/implants available, medications/allergies/relevent history reviewed, required imaging and test results available.  Performed  Maximum sterile technique was used including antiseptics, cap, gloves, gown, hand hygiene, mask and sheet. Skin prep: Chlorhexidine; local anesthetic administered A antimicrobial bonded/coated triple lumen catheter was placed in the left internal jugular vein using the Seldinger technique.  Evaluation Blood flow good Complications: No apparent complications Patient did tolerate procedure well. Chest X-ray ordered to verify placement.  CXR: pending.      Procedure was performed using ultrasound for direct visualization of cannulation of left IJ.    Line was secured at 20 cm.        Harlon Ditty, AGACNP-BC  [facility] Pager: [phone]  Judithe Modest 10/02/2019, 2:07 AM

## 2019-10-02 NOTE — Progress Notes (Signed)
eLink Physician-Brief Progress Note Patient Name: [name] Gilbert DOB: 06/19/1961 MRN: [id number]   Date of Service  10/02/2019  HPI/Events of Note  Pt admitted to [facility] ICU due to uncontrolled type 2 diabetes with hyperosmolar syndrome, complicated by vomiting in the ED and hypoxemic respiratory failure due to aspiration.  eICU Interventions  New Patient Evaluation completed.        [name] 10/02/2019, 12:51 AM

## 2019-10-02 NOTE — Progress Notes (Signed)
Propofol and fentanyl remain off.  Patient does not wake and follow commands.  Dr. Karna Christmas at bedside to assess patient. Per Dr. Karna Christmas patient needs more time for medication to clear from system.

## 2019-10-02 NOTE — Progress Notes (Signed)
End Tidal CO2 module was not reading properly. New ETCO2 sensor was replaced but still not reading on the monitor. Module was replaced and working properly.   RN called RT to bedside for assistance with placing bite block. Patient clamping down on ETT and NG tube despite sedation and bolus'. RN at bedside. Biteblock placed with no complications. Patient tolerated well.

## 2019-10-02 NOTE — Progress Notes (Addendum)
Patient not waking or following simple commands.  Propofol and fentanyl turned off.

## 2019-10-03 LAB — BASIC METABOLIC PANEL
Anion gap: 5 (ref 5–15)
BUN: 23 mg/dL — ABNORMAL HIGH (ref 6–20)
CO2: 23 mmol/L (ref 22–32)
Calcium: 7.4 mg/dL — ABNORMAL LOW (ref 8.9–10.3)
Chloride: 110 mmol/L (ref 98–111)
Creatinine, Ser: 1.25 mg/dL — ABNORMAL HIGH (ref 0.44–1.00)
GFR calc Af Amer: 54 mL/min — ABNORMAL LOW (ref >60)
GFR calc non Af Amer: 47 mL/min — ABNORMAL LOW (ref >60)
Glucose, Bld: 146 mg/dL — ABNORMAL HIGH (ref 70–99)
Potassium: 4 mmol/L (ref 3.5–5.1)
Sodium: 138 mmol/L (ref 135–145)

## 2019-10-03 LAB — CBC
HCT: 31.2 % — ABNORMAL LOW (ref 36.0–46.0)
Hemoglobin: 10.6 g/dL — ABNORMAL LOW (ref 12.0–15.0)
MCH: 29.3 pg (ref 26.0–34.0)
MCHC: 34 g/dL (ref 30.0–36.0)
MCV: 86.2 fL (ref 80.0–100.0)
Platelets: 177 10*3/uL (ref 150–400)
RBC: 3.62 MIL/uL — ABNORMAL LOW (ref 3.87–5.11)
RDW: 12.9 % (ref 11.5–15.5)
WBC: 8.1 10*3/uL (ref 4.0–10.5)
nRBC: 0 % (ref 0.0–0.2)

## 2019-10-03 LAB — GLUCOSE, CAPILLARY
Glucose-Capillary: 105 mg/dL — ABNORMAL HIGH (ref 70–99)
Glucose-Capillary: 129 mg/dL — ABNORMAL HIGH (ref 70–99)
Glucose-Capillary: 130 mg/dL — ABNORMAL HIGH (ref 70–99)
Glucose-Capillary: 132 mg/dL — ABNORMAL HIGH (ref 70–99)
Glucose-Capillary: 155 mg/dL — ABNORMAL HIGH (ref 70–99)
Glucose-Capillary: 53 mg/dL — ABNORMAL LOW (ref 70–99)
Glucose-Capillary: 57 mg/dL — ABNORMAL LOW (ref 70–99)
Glucose-Capillary: 63 mg/dL — ABNORMAL LOW (ref 70–99)
Glucose-Capillary: 72 mg/dL (ref 70–99)

## 2019-10-03 LAB — CBC WITH DIFFERENTIAL/PLATELET
Abs Immature Granulocytes: 0.01 10*3/uL (ref 0.00–0.07)
Basophils Absolute: 0 10*3/uL (ref 0.0–0.1)
Basophils Relative: 0 %
Eosinophils Absolute: 0.1 10*3/uL (ref 0.0–0.5)
Eosinophils Relative: 2 %
HCT: 30.7 % — ABNORMAL LOW (ref 36.0–46.0)
Hemoglobin: 10.4 g/dL — ABNORMAL LOW (ref 12.0–15.0)
Immature Granulocytes: 0 %
Lymphocytes Relative: 22 %
Lymphs Abs: 1.8 10*3/uL (ref 0.7–4.0)
MCH: 29.1 pg (ref 26.0–34.0)
MCHC: 33.9 g/dL (ref 30.0–36.0)
MCV: 86 fL (ref 80.0–100.0)
Monocytes Absolute: 0.8 10*3/uL (ref 0.1–1.0)
Monocytes Relative: 10 %
Neutro Abs: 5.6 10*3/uL (ref 1.7–7.7)
Neutrophils Relative %: 66 %
Platelets: 168 10*3/uL (ref 150–400)
RBC: 3.57 MIL/uL — ABNORMAL LOW (ref 3.87–5.11)
RDW: 13 % (ref 11.5–15.5)
WBC: 8.4 10*3/uL (ref 4.0–10.5)
nRBC: 0 % (ref 0.0–0.2)

## 2019-10-03 LAB — PROCALCITONIN: Procalcitonin: <0.1 ng/mL

## 2019-10-03 MED ORDER — INSULIN ASPART 100 UNIT/ML ~~LOC~~ SOLN
0.0000 [IU] | SUBCUTANEOUS | Status: DC
  Administered 2019-10-04 (×2): 2 [IU] via SUBCUTANEOUS
  Administered 2019-10-04: 13:00:00 1 [IU] via SUBCUTANEOUS
  Administered 2019-10-05 (×2): 2 [IU] via SUBCUTANEOUS
  Filled 2019-10-03 (×5): qty 1

## 2019-10-03 MED ORDER — INSULIN DETEMIR 100 UNIT/ML ~~LOC~~ SOLN
16.0000 [IU] | Freq: Every day | SUBCUTANEOUS | Status: DC
  Administered 2019-10-04 – 2019-10-05 (×2): 16 [IU] via SUBCUTANEOUS
  Filled 2019-10-03 (×3): qty 0.16

## 2019-10-03 MED ORDER — FENTANYL CITRATE (PF) 100 MCG/2ML IJ SOLN
12.5000 ug | INTRAMUSCULAR | Status: DC | PRN
  Administered 2019-10-04 (×2): 25 ug via INTRAVENOUS
  Filled 2019-10-03 (×2): qty 2

## 2019-10-03 MED ORDER — DEXTROSE-NACL 5-0.9 % IV SOLN: INTRAVENOUS | Status: DC

## 2019-10-03 NOTE — Progress Notes (Signed)
Name: [name] Gilbert MRN: [id number] DOB: 12/06/1960    ADMISSION DATE:  10/01/2019 CONSULTATION DATE:  10/01/2019  REFERRING MD : Dr. Mayford Knife  CHIEF COMPLAINT: Altered mental status  BRIEF PATIENT DESCRIPTION:  59 year old female admitted 10/01/2019 with altered mental status secondary to  Hyperosmolar hyperglycemic state requiring insulin drip.  While in ED she had episode of vomiting with concern for aspiration and inability to maintain her airway, of which she was subsequently intubated in the ED.  SIGNIFICANT EVENTS  1/20-presented to ED 1/20- vomiting with concern for aspiration while in ED requiring intubation 1/20- Admission to ICU 1/22- liberated from mechanical ventilation.  STUDIES:  1/20-CT head>>Moderate motion degradation limits evaluation. No evidence of acute intracranial abnormality.Unchanged chronic infarcts within the bilateral parietal lobes and left occipital lobe. Redemonstrated chronic small-vessel ischemic changes within the left deep gray nuclei. 1/20-chest x-ray>>No acute abnormalities.  CULTURES: SARS-CoV-2 PCR 1/20>> Influenza PCR 1/20>> Tracheal Aspirate 1/20>>  ANTIBIOTICS: Meropenem 1/20>>Rocephin   LINES/TUBES: ETT 1/20>> Left IJ CVC 1/21>>  HISTORY OF PRESENT ILLNESS:   [name] Gilbert is a 59 year old female with a past medical history notable for diabetes mellitus, hypertension, CVA, and visual impairment who presented to [facility] ED on 10/01/2019 due to altered mental status and combativeness.  Patient is currently intubated and sedated and no family present at bedside, therefore history is obtained from ED and nursing notes.  Initial work-up in the ED revealed glucose 1030, BUN 30, creatinine 1.87, sodium 124, lactic acid 4.5, beta hydroxybutyric acid 0.18.  Urinalysis negative for ketones, and negative for UTI.  Chest x-ray was normal, and CT head negative for any acute intracranial abnormalities.  Her COVID-19 PCR is negative, influenza PCR is  negative.  She was to be admitted to ICU for further work-up and treatment of altered mental status in the setting of hyperosmolar hyperglycemic state.    However while awaiting bed placement in the ED, she became increasingly altered with vomiting, concerning for aspiration and inability to maintain her airway.  She was subsequently intubated in the ED.  PCCM is consulted to admit.  PAST MEDICAL HISTORY :   has a past medical history of Diabetes mellitus without complication (HCC), Hypertension, Stroke (HCC), and visual impaired.  has a past surgical history that includes Abdominal hysterectomy and Tonsillectomy. Prior to Admission medications   Medication Sig Start Date End Date Taking? Authorizing Provider  amLODipine (NORVASC) 10 MG tablet Take 10 mg by mouth daily. 09/28/19  Yes [provider]  atorvastatin (LIPITOR) 40 MG tablet Take 40 mg by mouth daily.   Yes [provider]  DULoxetine (CYMBALTA) 20 MG capsule Take 60 mg by mouth daily. 04/21/19  Yes [provider]  insulin aspart (NOVOLOG FLEXPEN) 100 UNIT/ML FlexPen Inject 2 Units into the skin 3 (three) times daily with meals. 11/01/18  Yes Wieting, Richard, MD  insulin degludec (TRESIBA FLEXTOUCH) 100 UNIT/ML SOPN FlexTouch Pen Inject 30 Units into the skin daily. 08/11/19  Yes [provider]  liraglutide (VICTOZA) 18 MG/3ML SOPN Inject 0.6 mg into the skin daily. 02/05/19  Yes [provider]  lisinopril-hydrochlorothiazide (PRINZIDE,ZESTORETIC) 20-25 MG tablet Take 1 tablet by mouth daily. 10/28/18 10/28/19 Yes [provider]   Allergies  Allergen Reactions   Penicillins Hives, Nausea And Vomiting and Swelling    Has patient had a PCN reaction causing immediate rash, facial/tongue/throat swelling, SOB or lightheadedness with hypotension: Yes Has patient had a PCN reaction causing severe rash involving mucus membranes or skin necrosis: No Has patient  had a PCN reaction that  required hospitalization Yes Has patient had a PCN reaction occurring within the last 10 years: No If all of the above answers are "NO", then may proceed with Cephalosporin use.    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 SOCIAL HISTORY:  reports that she has never smoked. She has never used smokeless tobacco. She reports that she does not drink alcohol or use drugs.    REVIEW OF SYSTEMS:   Unable to assess due to intubation and critical illness  SUBJECTIVE:  Unable to assess due to intubation and critical illness  VITAL SIGNS: Temp:  [93.7 F (34.3 C)-99.8 F (37.7 C)] 97.9 F (36.6 C) (01/22 1400) Pulse Rate:  [43-109] 69 (01/22 1400) Resp:  [3-31] 17 (01/22 1400) BP: (50-164)/(32-78) 120/65 (01/22 1400) SpO2:  [99 %-100 %] 100 % (01/22 1400) FiO2 (%):  [12 %-24 %] 24 % (01/22 1140) Weight:  [80.1 kg] 80.1 kg (01/22 0422)  PHYSICAL EXAMINATION: General: Acutely ill-appearing female, laying in bed, intubated and sedated, no acute distress Neuro: Sedated, intermittent agitation, withdraws from pain with purposeful movement, does not follow commands HEENT: Atraumatic, normocephalic, neck supple, no JVD, pupils PERRLA Cardiovascular: Tachycardia, regular rhythm, S1-S2, no murmurs, rubs, or gallops, 2+ pulses Lungs: Clear to auscultation bilaterally, no wheezing or rails, vent assisted, even, nonlabored Abdomen: Soft, nontender, nondistended, no guarding or rebound tenderness, bowel sounds positive x4 Musculoskeletal: No deformities, normal bulk and tone, no edema Skin: Warm and dry, no obvious rashes, lesions, or ulcerations  Recent Labs  Lab 10/02/19 0240 10/02/19 0645 10/03/19 0427  NA 138 138 138  K 3.9 4.2 4.0  CL 106 106 110  CO2 24 25 23   BUN 25* 26* 23*  CREATININE  1.56* 1.32* 1.25*  GLUCOSE 185* 214* 146*   Recent Labs  Lab 10/01/19 2038 10/02/19 0511 10/03/19 0427  HGB 12.9 12.0 10.6*  HCT 38.4 34.9* 31.2*  WBC 6.7 13.5* 8.1  PLT 210 237 177   DG Chest 1 View  Result Date: 10/01/2019 CLINICAL DATA:  Tube placement EXAM: CHEST  1 VIEW COMPARISON:  09/30/2018 FINDINGS: Interval intubation, tip of the endotracheal tube is about 1.5 cm superior to the carina. Esophageal tube is looped over the gastric fundus. Low lung volumes. No focal airspace disease, pleural effusion or pneumothorax. Normal heart size IMPRESSION: 1. Endotracheal tube tip about 1.5 cm superior to carina 2. Esophageal tube tip overlies the gastric fundus. 3. Clear lung fields Electronically Signed   By: Donavan Foil M.D.   On: 10/01/2019 22:56   DG Chest 1 View  Result Date: 10/01/2019 CLINICAL DATA:  Combative, altered mental status, stroke EXAM: CHEST  1 VIEW COMPARISON:  Portable exam 2026 hours compared to 10/31/2018 FINDINGS: Normal heart size, mediastinal contours, and pulmonary vascularity. Lungs clear. No pulmonary infiltrate, pleural effusion or pneumothorax. Bones unremarkable. IMPRESSION: No acute abnormalities. Electronically Signed   By: Lavonia Dana M.D.   On: 10/01/2019 20:56   CT Head Wo Contrast  Result Date: 10/01/2019 CLINICAL DATA:  Encephalopathy. Additional history provided: Patient arrives from home nonverbal but alert, combative, fixed EAC the right, history of stroke, moving all extremities EXAM: CT HEAD WITHOUT CONTRAST TECHNIQUE: Contiguous axial images were obtained from the base of the skull through the  vertex without intravenous contrast. COMPARISON:  Head CT 10/31/2018 FINDINGS: Brain: The examination is moderately motion degraded, limiting evaluation. Within this limitation, no acute intracranial hemorrhage or acute demarcated cortical infarction is identified. Redemonstrated chronic encephalomalacia within the left parietal and occipital lobes with  associated ex vacuo dilatation of the posterior left lateral ventricle. Redemonstrated patchy chronic encephalomalacia within the superior right parietal lobe. Chronic heterogeneity within the left lentiform nucleus. Mild generalized parenchymal atrophy. There is no evidence of intracranial mass. No midline shift or extra-axial fluid collection. Vascular: No hyperdense vessel is identified. Atherosclerotic calcifications. Skull: Within the limitations of motion degradation, no calvarial fracture or suspicious osseous lesion is identified. Sinuses/Orbits: Rightward gaze. No significant paranasal sinus disease or mastoid effusion at the imaged levels. IMPRESSION: Moderate motion degradation limits evaluation. No evidence of acute intracranial abnormality. Unchanged chronic infarcts within the bilateral parietal lobes and left occipital lobe. Redemonstrated chronic small-vessel ischemic changes within the left deep gray nuclei. Electronically Signed   By: Jackey Loge DO   On: 10/01/2019 21:36   DG Chest Port 1 View  Result Date: 10/02/2019 CLINICAL DATA:  Central line placement EXAM: PORTABLE CHEST 1 VIEW COMPARISON:  October 01, 2019 FINDINGS: The endotracheal tube terminates approximately 3.5 cm above the carina. There is a well-positioned left IJ central venous catheter with tip terminating near the cavoatrial junction. The enteric tube terminates over the gastric body. The lungs are essentially clear. There is no evidence for pneumothorax. No large pleural effusion. The heart size is normal. IMPRESSION: 1. Lines and tubes as above. 2. No pneumothorax. 3. The lungs are clear. Electronically Signed   By: Katherine Mantle M.D.   On: 10/02/2019 02:36       ASSESSMENT / PLAN:  Hyperosmolar Hyperglycemic State Hx: DM -Follow HHS protocol- off of insulin drip now  -Aggressive IV fluids -Insulin drip-stopped,  Blood glucose repeatedly <200 over 24h -resolved - plan to transfer to SDU with hospitalist  consult  Aspiration pneumonia  -weaning trial and liberation from MV -stopped Rocephin per pharmacy recommendation - patient has PCN allergies - currently 100% on room air -repeat CBCw/Diff pending   AKI Hypertonic Hyponatremia (corrected Na given glucose of 1030 is Na 139) Lactic acidosis -Monitor I&O's / urinary output -Follow BMP -Ensure adequate renal perfusion -Avoid nephrotoxic agents as able -Replace electrolytes as indicated -IV Fluids -improingTrend lactic acid   Acute Metabolic Encephalopathy in setting of HHS Sedation needs in setting of mechanical ventilation Hx: CVA, visual impairment -Correct HHS -Maintain RASS 0 to -1 -Propfol gtt to maintain RASS goal -Avoid sedating meds as able -Daily wake up assessment -Provide supportive care -CT Head negative for acute intracranial abnormality -Check Urine drug screen   DISPOSITION: ICU GOALS OF CARE: Full code VTE PROPHYLAXIS: SQ Heparin SUP PROPHYLAXIS: Pepcid UPDATES: Pt intubated and sedated, No family at bedside for update during NP rounds 10/01/2019   Critical care provider statement:    Critical care time (minutes):  33   Critical care time was exclusive of:  Separately billable procedures and  treating other patients   Critical care was necessary to treat or prevent imminent or  life-threatening deterioration of the following conditions:  aspiration pneumonia, hyperosmolar non ketotic hypeglycemia, multiple comorbid conditions   Critical care was time spent personally by me on the following  activities:  Development of treatment plan with patient or surrogate,  discussions with consultants, evaluation of patient's response to  treatment, examination of patient, obtaining history from patient or  surrogate, ordering  and performing treatments and interventions, ordering  and review of laboratory studies and re-evaluation of patient's condition   I assumed direction of critical care for this patient from  another  provider in my specialty: no      Vida Rigger, M.D.  Pulmonary & Critical Care Medicine  [facility]

## 2019-10-03 NOTE — Progress Notes (Signed)
Patients bladder temp per foley cath is 34.4.  Will implement bare hugger and stop precedex to promote awakening trial.

## 2019-10-03 NOTE — Progress Notes (Addendum)
Patient has extubated from vent therapy and is breathing at a normal rate and normal rhythm on room air.  Her saturations are recording at 100% with a good wave pleth. Her CVP has been unremarkable.  Her husband came to bedside for a visit.  I explained the procedure for body warming through the bear hugger which successfully revived patients core temp to 37.4c.  Patients blood glucose levels were recorded low this afternoon around 35.  Utilized the hypoglycemia protocol and pushed 25g of dextrose via IV.  FSBS recheck indicated a blood sugar of 155.  Removed the hand mittens from patient as she has begun to express herself ina lucid manner.  Her UOP has ben WDL.  Her vitals have been WDL. Her peripheral pulses are good.  She is resting.  Coordinated with Physician to infuse 5% dextrose NS at 42ml hr to avoid further glucose reductions.

## 2019-10-03 NOTE — Progress Notes (Signed)
Patient was extubated around 1200 after she passed her spontaneous breathing trial.  She is resting now on RA at 91%.  Her husband has come to visit her. She responds to voice and moves herself left and right in bed along with movement of her legs to adjust for her sleeping posture.  CVP is WDL.  Urine output is low. Will consult physician.

## 2019-10-03 NOTE — Progress Notes (Signed)
Inpatient Diabetes Program Recommendations  AACE/ADA: New Consensus Statement on Inpatient Glycemic Control (2015)  Target Ranges:  Prepandial:   less than 140 mg/dL      Peak postprandial:   less than 180 mg/dL (1-2 hours)      Critically ill patients:  140 - 180 mg/dL   Results for [name] Gilbert, [name] Gilbert (MRN [id number]) as of 10/03/2019 11:28  Ref. Range 10/03/2019 00:10 10/03/2019 03:43 10/03/2019 07:29 10/03/2019 11:22 10/03/2019 11:25  Glucose-Capillary Latest Ref Range: 70 - 99 mg/dL 456 (H) 256 (H) 389 (H) 57 (L) 72     Home DM Meds: Tresiba 30 units daily       Novolog 2 units TID with meals       Victoza 0.6 mg daily  Current Orders: Levemir 20 units Daily      Novolog Moderate Correction Scale/ SSI (0-15 units) Q4 hours     Transitioned to SQ Insulin yesterday--Levemir 20 units started at 8am.  Mild Hypoglycemic event today at 11am.     MD- Please consider the following:  1. Reduce Levemir to 16 units Daily (20% reduction of dose)  2. Reduce Novolog SSI to Sensitive scale (0-9 units) Q4 hours     --Will follow patient during hospitalization--  Ambrose Finland RN, MSN, CDE Diabetes Coordinator Inpatient Glycemic Control Team Team Pager: [phone] (8a-5p)

## 2019-10-03 NOTE — Progress Notes (Signed)
Assessed patient on ventilator settings: FiO2 24% Peep 5, RR 10, 430 TV.  Patient vitals B/P 111/67, Saturating at 100%, HR is bradycardic maintaining 45-50 BPM.  Her heart did have an episode of 35 BPM that lasted for about 20 secs.  Upon auscultation of the lungs and belly the patient awoke to touch stimulus and was very restless and tearful.  Prepared to administer push of fentanyl but patient fell back asleep and is resting at bradycardic HR again.  Performed mouth care and suctioned her mouth d/t some emesis that occurred upon assessment.

## 2019-10-03 NOTE — Clinical Social Work Note (Signed)
CSW acknowledges consult for advanced directives and family contact information. Medical staff have been able to reach patient's husband. No further concerns. CSW signing off.  Charlynn Court, CSW [phone]

## 2019-10-04 DIAGNOSIS — J9601 Acute respiratory failure with hypoxia: Secondary | ICD-10-CM

## 2019-10-04 DIAGNOSIS — E1101 Type 2 diabetes mellitus with hyperosmolarity with coma: Secondary | ICD-10-CM

## 2019-10-04 LAB — BASIC METABOLIC PANEL
Anion gap: 5 (ref 5–15)
BUN: 14 mg/dL (ref 6–20)
CO2: 25 mmol/L (ref 22–32)
Calcium: 7.8 mg/dL — ABNORMAL LOW (ref 8.9–10.3)
Chloride: 110 mmol/L (ref 98–111)
Creatinine, Ser: 1.06 mg/dL — ABNORMAL HIGH (ref 0.44–1.00)
GFR calc Af Amer: >60 mL/min (ref >60)
GFR calc non Af Amer: 57 mL/min — ABNORMAL LOW (ref >60)
Glucose, Bld: 132 mg/dL — ABNORMAL HIGH (ref 70–99)
Potassium: 3.2 mmol/L — ABNORMAL LOW (ref 3.5–5.1)
Sodium: 140 mmol/L (ref 135–145)

## 2019-10-04 LAB — CBC WITH DIFFERENTIAL/PLATELET
Abs Immature Granulocytes: 0.02 10*3/uL (ref 0.00–0.07)
Basophils Absolute: 0 10*3/uL (ref 0.0–0.1)
Basophils Relative: 0 %
Eosinophils Absolute: 0.1 10*3/uL (ref 0.0–0.5)
Eosinophils Relative: 2 %
HCT: 31.3 % — ABNORMAL LOW (ref 36.0–46.0)
Hemoglobin: 10.7 g/dL — ABNORMAL LOW (ref 12.0–15.0)
Immature Granulocytes: 0 %
Lymphocytes Relative: 27 %
Lymphs Abs: 2 10*3/uL (ref 0.7–4.0)
MCH: 29.2 pg (ref 26.0–34.0)
MCHC: 34.2 g/dL (ref 30.0–36.0)
MCV: 85.3 fL (ref 80.0–100.0)
Monocytes Absolute: 0.8 10*3/uL (ref 0.1–1.0)
Monocytes Relative: 10 %
Neutro Abs: 4.7 10*3/uL (ref 1.7–7.7)
Neutrophils Relative %: 61 %
Platelets: 198 10*3/uL (ref 150–400)
RBC: 3.67 MIL/uL — ABNORMAL LOW (ref 3.87–5.11)
RDW: 12.8 % (ref 11.5–15.5)
WBC: 7.6 10*3/uL (ref 4.0–10.5)
nRBC: 0 % (ref 0.0–0.2)

## 2019-10-04 LAB — GLUCOSE, CAPILLARY
Glucose-Capillary: 108 mg/dL — ABNORMAL HIGH (ref 70–99)
Glucose-Capillary: 120 mg/dL — ABNORMAL HIGH (ref 70–99)
Glucose-Capillary: 134 mg/dL — ABNORMAL HIGH (ref 70–99)
Glucose-Capillary: 145 mg/dL — ABNORMAL HIGH (ref 70–99)
Glucose-Capillary: 173 mg/dL — ABNORMAL HIGH (ref 70–99)
Glucose-Capillary: 54 mg/dL — ABNORMAL LOW (ref 70–99)
Glucose-Capillary: 67 mg/dL — ABNORMAL LOW (ref 70–99)
Glucose-Capillary: 80 mg/dL (ref 70–99)

## 2019-10-04 LAB — PHOSPHORUS: Phosphorus: 2.1 mg/dL — ABNORMAL LOW (ref 2.5–4.6)

## 2019-10-04 LAB — MAGNESIUM: Magnesium: 1.7 mg/dL (ref 1.7–2.4)

## 2019-10-04 MED ORDER — ORAL CARE MOUTH RINSE
15.0000 mL | Freq: Every morning | OROMUCOSAL | Status: DC
  Administered 2019-10-05: 09:00:00 15 mL via OROMUCOSAL

## 2019-10-04 MED ORDER — MAGNESIUM SULFATE 2 GM/50ML IV SOLN
2.0000 g | Freq: Once | INTRAVENOUS | Status: AC
  Administered 2019-10-04: 2 g via INTRAVENOUS
  Filled 2019-10-04: qty 50

## 2019-10-04 MED ORDER — POTASSIUM PHOSPHATES 15 MMOLE/5ML IV SOLN
10.0000 mmol | Freq: Once | INTRAVENOUS | Status: AC
  Administered 2019-10-04: 09:00:00 10 mmol via INTRAVENOUS
  Filled 2019-10-04: qty 3.33

## 2019-10-04 NOTE — Progress Notes (Signed)
Name: Keyle Doby MRN: [id number] DOB: 1961/04/24    ADMISSION DATE:  10/01/2019 CONSULTATION DATE:  10/01/2019  REFERRING MD : Dr. Jimmye Norman  CHIEF COMPLAINT: Altered mental status  BRIEF PATIENT DESCRIPTION:  59 year old female admitted 10/01/2019 with altered mental status secondary to  Hyperosmolar hyperglycemic state requiring insulin drip.  While in ED she had episode of vomiting with concern for aspiration and inability to maintain her airway, of which she was subsequently intubated in the ED.  SIGNIFICANT EVENTS  1/20-presented to ED 1/20- vomiting with concern for aspiration while in ED requiring intubation 1/20- Admission to ICU 1/22- liberated from mechanical ventilation. 10/04/19- patient is more lucid and able to speak now, will optimize for downgrade to SDU  STUDIES:  1/20-CT head>>Moderate motion degradation limits evaluation. No evidence of acute intracranial abnormality.Unchanged chronic infarcts within the bilateral parietal lobes and left occipital lobe. Redemonstrated chronic small-vessel ischemic changes within the left deep gray nuclei. 1/20-chest x-ray>>No acute abnormalities.  CULTURES: SARS-CoV-2 PCR 1/20>> Influenza PCR 1/20>> Tracheal Aspirate 1/20>>  ANTIBIOTICS: Meropenem 1/20>>Rocephin   LINES/TUBES: ETT 1/20>> Left IJ CVC 1/21>>  HISTORY OF PRESENT ILLNESS:   Mrs. Chanda is a 59 year old female with a past medical history notable for diabetes mellitus, hypertension, CVA, and visual impairment who presented to [facility] ED on 10/01/2019 due to altered mental status and combativeness.  Patient is currently intubated and sedated and no family present at bedside, therefore history is obtained from ED and nursing notes.  Initial work-up in the ED revealed glucose 1030, BUN 30, creatinine 1.87, sodium 124, lactic acid 4.5, beta hydroxybutyric acid 0.18.  Urinalysis negative for ketones, and negative for UTI.  Chest x-ray was normal, and CT head negative for  any acute intracranial abnormalities.  Her COVID-19 PCR is negative, influenza PCR is negative.  She was to be admitted to ICU for further work-up and treatment of altered mental status in the setting of hyperosmolar hyperglycemic state.    However while awaiting bed placement in the ED, she became increasingly altered with vomiting, concerning for aspiration and inability to maintain her airway.  She was subsequently intubated in the ED.  PCCM is consulted to admit.  PAST MEDICAL HISTORY :   has a past medical history of Diabetes mellitus without complication ([place]), Hypertension, Stroke ([place]), and visual impaired.  has a past surgical history that includes Abdominal hysterectomy and Tonsillectomy. Prior to Admission medications   Medication Sig Start Date End Date Taking? Authorizing Provider  amLODipine (NORVASC) 10 MG tablet Take 10 mg by mouth daily. 09/28/19  Yes [provider]  atorvastatin (LIPITOR) 40 MG tablet Take 40 mg by mouth daily.   Yes [provider]  DULoxetine (CYMBALTA) 20 MG capsule Take 60 mg by mouth daily. 04/21/19  Yes [provider]  insulin aspart (NOVOLOG FLEXPEN) 100 UNIT/ML FlexPen Inject 2 Units into the skin 3 (three) times daily with meals. 11/01/18  Yes Wieting, Richard, MD  insulin degludec (TRESIBA FLEXTOUCH) 100 UNIT/ML SOPN FlexTouch Pen Inject 30 Units into the skin daily. 08/11/19  Yes [provider]  liraglutide (VICTOZA) 18 MG/3ML SOPN Inject 0.6 mg into the skin daily. 02/05/19  Yes [provider]  lisinopril-hydrochlorothiazide (PRINZIDE,ZESTORETIC) 20-25 MG tablet Take 1 tablet by mouth daily. 10/28/18 10/28/19 Yes [provider]   Allergies  Allergen Reactions  . Penicillins Hives, Nausea And Vomiting and Swelling    Has patient had a PCN reaction causing immediate rash, facial/tongue/throat swelling, SOB or lightheadedness with hypotension: Yes Has patient  had a PCN reaction causing severe rash  involving mucus membranes or skin necrosis: No Has patient had a PCN reaction that required hospitalization Yes Has patient had a PCN reaction occurring within the last 10 years: No If all of the above answers are "NO", then may proceed with Cephalosporin use.   . Codeine Other (See Comments)    Reaction: unknown  . Metformin Other (See Comments)    Reaction: unknown  . 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 SOCIAL HISTORY:  reports that she has never smoked. She has never used smokeless tobacco. She reports that she does not drink alcohol or use drugs.    REVIEW OF SYSTEMS:   Unable to assess due to intubation and critical illness  SUBJECTIVE:  Unable to assess due to intubation and critical illness  VITAL SIGNS: Temp:  [98.8 F (37.1 C)-100.4 F (38 C)] 99 F (37.2 C) (01/23 0800) Pulse Rate:  [69-97] 72 (01/23 1000) Resp:  [9-22] 16 (01/23 1000) BP: (94-169)/(58-115) 130/58 (01/23 1000) SpO2:  [91 %-100 %] 98 % (01/23 1000) Weight:  [80.2 kg] 80.2 kg (01/23 0452)  PHYSICAL EXAMINATION: General: Crhronically ill-appearing female, Neuro: mentation is slow but appropriate HEENT: Atraumatic, normocephalic, neck supple, no JVD, pupils PERRLA Cardiovascular: normal S1-S2, no murmurs, rubs, or gallops, 2+ pulses Lungs: Clear to auscultation bilaterally, no wheezing or rales Abdomen: Soft, nontender, nondistended, no guarding or rebound tenderness, bowel sounds positive x4 Musculoskeletal: No deformities, normal bulk and tone, no edema Skin: Warm and dry, no obvious rashes, lesions, or ulcerations  Recent Labs  Lab 10/02/19 0645 10/03/19 0427 10/04/19 0456  NA 138 138 140  K 4.2 4.0 3.2*  CL 106 110 110  CO2 25 23 25   BUN 26* 23* 14  CREATININE 1.32* 1.25* 1.06*  GLUCOSE 214* 146* 132*   Recent Labs  Lab 10/03/19 0427 10/03/19 1456 10/04/19 0456  HGB 10.6* 10.4* 10.7*  HCT  31.2* 30.7* 31.3*  WBC 8.1 8.4 7.6  PLT 177 168 198   No results found.     ASSESSMENT / PLAN:  Hyperosmolar Hyperglycemic State Hx: DM -Follow HHS protocol- off of insulin drip now  -Aggressive IV fluids -Insulin drip-stopped,  Blood glucose repeatedly <200 over 24h -resolved - plan to transfer to SDU with hospitalist consult  Aspiration pneumonia  -weaning trial and liberation from MV -stopped Rocephin per pharmacy recommendation - patient has PCN allergies - currently 100% on room air -repeat CBCw/Diff pending   AKI-IMPORVED  Hypertonic Hyponatremia (corrected Na given glucose of 1030 is Na 139) Lactic acidosis -Monitor I&O's / urinary output -Follow BMP -Ensure adequate renal perfusion -Avoid nephrotoxic agents as able -Replace electrolytes as indicated -IV Fluids -improingTrend lactic acid   Acute Metabolic Encephalopathy in setting of HHS-RESOLVED Sedation needs in setting of mechanical ventilation Hx: CVA, visual impairment HHS-RESOLVED  -Avoid sedating meds as able -Provide supportive care   DISPOSITION: ICU GOALS OF CARE: Full code VTE PROPHYLAXIS: SQ Heparin SUP PROPHYLAXIS: Pepcid UPDATES: Pt intubated and sedated, No family at bedside for update during NP rounds 10/01/2019   Critical care provider statement:    Critical care time (minutes):  33   Critical care time was exclusive of:  Separately billable procedures and  treating other patients   Critical care was necessary to treat or prevent imminent or  life-threatening deterioration of the following conditions:  aspiration pneumonia, hyperosmolar non ketotic hypeglycemia,  multiple comorbid conditions   Critical care was time spent personally by me on the following  activities:  Development of treatment plan with patient or surrogate,  discussions with consultants, evaluation of patient's response to  treatment, examination of patient, obtaining history from patient or  surrogate, ordering  and performing treatments and interventions, ordering  and review of laboratory studies and re-evaluation of patient's condition   I assumed direction of critical care for this patient from another  provider in my specialty: no      Vida Rigger, M.D.  Pulmonary & Critical Care Medicine  [facility]

## 2019-10-04 NOTE — Progress Notes (Signed)
PROGRESS NOTE  Transfer from PCCM.  [name] Gilbert  SFS:[id number] DOB: [birth year] DOA: 10/01/2019 PCP: Christella Scheuermann, MD   Brief Narrative:  [name] Gilbert is a 59 year old female with a past medical history notable for diabetes mellitus, hypertension, CVA, and visual impairment who presented to [facility] ED on 10/01/2019 due to altered mental status and combativeness. Her mental status worsened ED and she started vomiting, intubated on 10/01/2019 for airway protection.  Extubated on 10/03/2019.  Saturating well on room air.  She was found to have hyperosmolar hyperglycemia which was initially treated with insulin gtt. later switched to long-acting and NovoLog. Patient remained stable and transferred out of ICU.  Subjective: Patient was feeling better when seen this morning.  She seems little disoriented, not know about her baseline.  No new complaints.  Assessment & Plan:   Active Problems:   Hyperosmolar hyperglycemic state (HHS) (HCC)  Encephalopathy.  Resolved.  Patient seems little disoriented as she was unable to tell me the year stating it is 30.  She could not name the president but does know that it is a new one.  The patient she has some memory issues because of her prior CVA.  Otherwise answering all the questions appropriately. -Transfer to MedSurg. -Discontinue central line and Foley's catheter.  Diabetes with hyperosmolar hyperglycemia.  Uncontrolled diabetes with A1c of 13.6.  Initially treated with insulin GTT.  Mental status improved along with blood glucose level. -CBG within goal with Levemir of 16 units daily and sensitive scale SSI.  Concern for aspiration pneumonia.  Initially treated with meropenem and then Rocephin which was discontinued per pharmacy recommendations due to her allergies with penicillin. -Chest x-ray clear. -Patient is saturating well on room air. -CBC with differential within normal limit. -Procalcitonin below 0.10. -No need for antibiotics at  this time  Objective: Vitals:   10/04/19 0700 10/04/19 0800 10/04/19 0900 10/04/19 1000  BP: (!) 154/72 (!) 164/75 (!) 141/63 (!) 130/58  Pulse: 69 73 73 72  Resp: '13 14 14 16  ' Temp: 99 F (37.2 C) 99 F (37.2 C)    TempSrc:  Bladder    SpO2: 99% 99% 99% 98%  Weight:      Height:        Intake/Output Summary (Last 24 hours) at 10/04/2019 1234 Last data filed at 10/04/2019 1029 Gross per 24 hour  Intake 1257.59 ml  Output 2365 ml  Net -1107.41 ml   Filed Weights   10/02/19 0500 10/03/19 0422 10/04/19 0452  Weight: 80 kg 80.1 kg 80.2 kg    Examination:  General exam: Appears calm and comfortable  Respiratory system: Clear to auscultation. Respiratory effort normal. Cardiovascular system: S1 & S2 heard, RRR. No JVD, murmurs, rubs, gallops or clicks. Gastrointestinal system: Soft, nontender, nondistended, bowel sounds positive. Central nervous system: Alert and oriented. No focal neurological deficits.Symmetric 5 x 5 power. Extremities: No edema, no cyanosis, pulses intact and symmetrical. Skin: No rashes, lesions or ulcers Psychiatry: Judgement and insight appear normal. Mood & affect appropriate.    DVT prophylaxis: Heparin Code Status: Full Family Communication: No family at bedside. Disposition Plan: Most likely be home tomorrow, will observe 1 more day.  Consultants:   PCCM  Procedures:  Antimicrobials:   Data Reviewed: I have personally reviewed following labs and imaging studies  CBC: Recent Labs  Lab 10/01/19 2038 10/02/19 0511 10/03/19 0427 10/03/19 1456 10/04/19 0456  WBC 6.7 13.5* 8.1 8.4 7.6  NEUTROABS 2.9  --   --  5.6 4.7  HGB 12.9 12.0 10.6* 10.4* 10.7*  HCT 38.4 34.9* 31.2* 30.7* 31.3*  MCV 85.7 84.1 86.2 86.0 85.3  PLT 210 237 177 168 161   Basic Metabolic Panel: Recent Labs  Lab 10/01/19 2038 10/02/19 0240 10/02/19 0645 10/03/19 0427 10/04/19 0456  NA 124* 138 138 138 140  K 4.4 3.9 4.2 4.0 3.2*  CL 90* 106 106 110 110  CO2  '22 24 25 23 25  ' GLUCOSE 1,030* 185* 214* 146* 132*  BUN 30* 25* 26* 23* 14  CREATININE 1.87* 1.56* 1.32* 1.25* 1.06*  CALCIUM 8.5* 8.0* 7.8* 7.4* 7.8*  MG  --   --   --   --  1.7  PHOS  --   --   --   --  2.1*   GFR: Estimated Creatinine Clearance: 62.7 mL/min (A) (by C-G formula based on SCr of 1.06 mg/dL (H)). Liver Function Tests: Recent Labs  Lab 10/01/19 2038  AST 32  ALT 22  ALKPHOS 168*  BILITOT 0.7  PROT 8.5*  ALBUMIN 3.5   No results for input(s): LIPASE, AMYLASE in the last 168 hours. No results for input(s): AMMONIA in the last 168 hours. Coagulation Profile: No results for input(s): INR, PROTIME in the last 168 hours. Cardiac Enzymes: No results for input(s): CKTOTAL, CKMB, CKMBINDEX, TROPONINI in the last 168 hours. BNP (last 3 results) No results for input(s): PROBNP in the last 8760 hours. HbA1C: Recent Labs    10/02/19 0238  HGBA1C 13.6*   CBG: Recent Labs  Lab 10/04/19 0006 10/04/19 0322 10/04/19 0355 10/04/19 0735 10/04/19 1126  GLUCAP 108* 67* 145* 120* 134*   Lipid Profile: Recent Labs    10/02/19 0239  TRIG 125   Thyroid Function Tests: No results for input(s): TSH, T4TOTAL, FREET4, T3FREE, THYROIDAB in the last 72 hours. Anemia Panel: No results for input(s): VITAMINB12, FOLATE, FERRITIN, TIBC, IRON, RETICCTPCT in the last 72 hours. Sepsis Labs: Recent Labs  Lab 10/01/19 2037 10/02/19 0239 10/02/19 0511 10/02/19 0526 10/02/19 1011 10/03/19 0427  PROCALCITON  --  <0.10 <0.10  --   --  <0.10  LATICACIDVEN 4.5* 4.2*  --  2.6* 2.0*  --     Recent Results (from the past 240 hour(s))  Respiratory Panel by RT PCR (Flu A&B, Covid) - Nasopharyngeal Swab     Status: None   Collection Time: 10/01/19  8:53 PM   Specimen: Nasopharyngeal Swab  Result Value Ref Range Status   SARS Coronavirus 2 by RT PCR NEGATIVE NEGATIVE Final    Comment: (NOTE) SARS-CoV-2 target nucleic acids are NOT DETECTED. The SARS-CoV-2 RNA is generally  detectable in upper respiratoy specimens during the acute phase of infection. The lowest concentration of SARS-CoV-2 viral copies this assay can detect is 131 copies/mL. A negative result does not preclude SARS-Cov-2 infection and should not be used as the sole basis for treatment or other patient management decisions. A negative result may occur with  improper specimen collection/handling, submission of specimen other than nasopharyngeal swab, presence of viral mutation(s) within the areas targeted by this assay, and inadequate number of viral copies (<131 copies/mL). A negative result must be combined with clinical observations, patient history, and epidemiological information. The expected result is Negative. Fact Sheet for Patients:  PinkCheek.be Fact Sheet for Healthcare Providers:  GravelBags.it This test is not yet ap proved or cleared by the [place] FDA and  has been authorized for detection and/or diagnosis of SARS-CoV-2 by FDA under an Emergency Use Authorization (EUA). This  EUA will remain  in effect (meaning this test can be used) for the duration of the COVID-19 declaration under Section 564(b)(1) of the Act, 21 U.S.C. section 360bbb-3(b)(1), unless the authorization is terminated or revoked sooner.    Influenza A by PCR NEGATIVE NEGATIVE Final   Influenza B by PCR NEGATIVE NEGATIVE Final    Comment: (NOTE) The Xpert Xpress SARS-CoV-2/FLU/RSV assay is intended as an aid in  the diagnosis of influenza from Nasopharyngeal swab specimens and  should not be used as a sole basis for treatment. Nasal washings and  aspirates are unacceptable for Xpert Xpress SARS-CoV-2/FLU/RSV  testing. Fact Sheet for Patients: PinkCheek.be Fact Sheet for Healthcare Providers: GravelBags.it This test is not yet approved or cleared by the [place] FDA and  has been  authorized for detection and/or diagnosis of SARS-CoV-2 by  FDA under an Emergency Use Authorization (EUA). This EUA will remain  in effect (meaning this test can be used) for the duration of the  Covid-19 declaration under Section 564(b)(1) of the Act, 21  U.S.C. section 360bbb-3(b)(1), unless the authorization is  terminated or revoked. Performed at [facility], [address] [place], [place] [address]   MRSA PCR Screening     Status: None   Collection Time: 10/02/19 12:18 AM   Specimen: Nasopharyngeal  Result Value Ref Range Status   MRSA by PCR NEGATIVE NEGATIVE Final    Comment:        The GeneXpert MRSA Assay (FDA approved for NASAL specimens only), is one component of a comprehensive MRSA colonization surveillance program. It is not intended to diagnose MRSA infection nor to guide or monitor treatment for MRSA infections. Performed at [facility], [address], [place],  [address]      Radiology Studies: No results found.  Scheduled Meds: . chlorhexidine gluconate (MEDLINE KIT)  15 mL Mouth Rinse BID  . Chlorhexidine Gluconate Cloth  6 each Topical Daily  . heparin  5,000 Units Subcutaneous Q8H  . insulin aspart  0-9 Units Subcutaneous Q4H  . insulin detemir  16 Units Subcutaneous Daily  . [START ON 10/05/2019] mouth rinse  15 mL Mouth Rinse q morning - 10a   Continuous Infusions: . dextrose 5 % and 0.9% NaCl 40 mL/hr at 10/04/19 1000  . famotidine (PEPCID) IV 20 mg (10/04/19 1029)  . potassium PHOSPHATE IVPB (in mmol) 42 mL/hr at 10/04/19 1000     LOS: 3 days   Time spent: 35 minutes.  Lorella Nimrod, MD [facility] Pager [phone]  If 7PM-7AM, please contact night-coverage [email or web address] Password [facility] 10/04/2019, 12:34 PM   This record has been created using Dragon voice recognition software. Errors have been sought and corrected,but may not always be located. Such creation errors do not reflect on the standard  of care.

## 2019-10-04 NOTE — Evaluation (Signed)
Clinical/Bedside Swallow Evaluation Patient Details  Name: [name] Gilbert MRN: [id number] Date of Birth: 08-Jun-1961  Today's Date: 10/04/2019 Time: SLP Start Time (ACUTE ONLY): 1000 SLP Stop Time (ACUTE ONLY): 1045 SLP Time Calculation (min) (ACUTE ONLY): 45 min  Past Medical History:  Past Medical History:  Diagnosis Date  . Diabetes mellitus without complication ([place])   . Hypertension   . Stroke ([place])   . visual impaired    Past Surgical History:  Past Surgical History:  Procedure Laterality Date  . ABDOMINAL HYSTERECTOMY    . TONSILLECTOMY     HPI:  Patient is a 59 year old female admitted 10/01/2019 with altered mental status secondary to  Hyperosmolar hyperglycemic state requiring insulin drip.  While in ED she had episode of vomiting with concern for aspiration and inability to maintain her airway, of which she was subsequently intubated via ETT in the ED. She was extubated on 1/22 and currently on RA without respiratory concerns since extubation. CT impression on 1/20 stated the following: "No evidence of acute intracranial abnormality. Unchanged chronic infarcts within the bilateral parietal lobes andleft occipital lobe. Redemonstrated chronic small-vessel ischemicchanges within the left deep gray nuclei."   Assessment / Plan / Recommendation Clinical Impression  Patient presents with the following characteristics at bedside: generalized oral weakness, slow oral motor movements, cognitive-feeding deficits with intermittent confusion (RN states confusion is improving), and infrequent coughing throughout session (with and without PO trials). Unable to r/o pharyngeal dysphagia at bedside, however patient generally presents with an oropharyngeal swallowing function that is grossly WNL when given the following consistencies: ice chips, thins via cup/straw, puree, soft solid. Due to cognitive-feeding deficits, intermittent confusion, and recent extubation, SLP recommends Fine Chop  (Dysphagia 2) diet and Thin liquids given supervision/assistance by staff during meals to follow safe swallowing precautions. Education provided to patient, RN, and MD re: diet recommendation and safe swallowing precautions. Of note, due to recent intubation, hx of CVA, and inability to r/o pharyngeal dysphagia at bedside, SLP to consider MBSS/FEES if swallowing issues occur during this hospital stay or following d/c. SLP to also consider cognitive-communication assessment if confusion does not improve and/or return to appropriate baseline. Patient agreed to POC re: teaching/training in safe swallowing strategies and potential diet upgrade (as appropriate) during patient's hospital stay.  She was left in bed with her call bell in reach and all wants/needs met before exiting room.    SLP Visit Diagnosis: Dysphagia, unspecified (R13.10)    Aspiration Risk  Mild aspiration risk    Diet Recommendation Dysphagia 2 (Fine chop);Thin liquid   Liquid Administration via: Cup;Straw Medication Administration: Crushed with puree Supervision: Staff to assist with self feeding;Intermittent supervision to cue for compensatory strategies Compensations: Minimize environmental distractions;Slow rate;Small sips/bites Postural Changes: Seated upright at 90 degrees    Other  Recommendations Oral Care Recommendations: Oral care before and after PO   Follow up Recommendations Other (comment)(TBD)      Frequency and Duration min 3x week  2 weeks       Prognosis Prognosis for Safe Diet Advancement: Good Barriers to Reach Goals: Other (Comment)(Lethargy, Deconditioning, Med hx (CVA, previous intubation))      Swallow Study   General Date of Onset: 10/01/19 HPI: Patient is a 59 year old female admitted 10/01/2019 with altered mental status secondary to  Hyperosmolar hyperglycemic state requiring insulin drip.  While in ED she had episode of vomiting with concern for aspiration and inability to maintain her  airway, of which she was subsequently intubated via ETT in the  ED. She was extubated on 1/22 and currently on RA without respiratory concerns since extubation. CT impression on 1/20 stated the following: "No evidence of acute intracranial abnormality. Unchanged chronic infarcts within the bilateral parietal lobes andleft occipital lobe. Redemonstrated chronic small-vessel ischemicchanges within the left deep gray nuclei." Type of Study: Bedside Swallow Evaluation Previous Swallow Assessment: Patient stated she had a swallowing test a few years ago when she had a stroke, however was able to eat/drink afterwards. She was unable to recall what type of swallowing test she had (i.e. BSE, FEES, or MBSS). Diet Prior to this Study: NPO Temperature Spikes Noted: No Respiratory Status: Room air History of Recent Intubation: Yes Length of Intubations (days): 2 days Date extubated: 10/03/19 Behavior/Cognition: Alert;Confused;Lethargic/Drowsy Oral Cavity Assessment: Other (comment)(Tongue appeared to have white coating) Oral Care Completed by SLP: Yes Oral Cavity - Dentition: Adequate natural dentition Vision: Functional for self-feeding Self-Feeding Abilities: Needs assist Patient Positioning: Upright in bed Baseline Vocal Quality: Normal Volitional Cough: Strong Volitional Swallow: (laryngeal movement present)    Oral/Motor/Sensory Function Overall Oral Motor/Sensory Function: Generalized oral weakness Facial ROM: Within Functional Limits Facial Symmetry: Within Functional Limits Facial Sensation: Within Functional Limits Lingual ROM: Within Functional Limits Lingual Symmetry: Within Functional Limits Velum: Within Functional Limits Mandible: Within Functional Limits   Ice Chips Ice chips: Within functional limits Presentation: Spoon   Thin Liquid Thin Liquid: Within functional limits Presentation: Straw;Spoon;Cup;Self Fed    Nectar Thick Nectar Thick Liquid: Not tested   Honey Thick Honey  Thick Liquid: Not tested   Puree Puree: Within functional limits Presentation: Spoon;Self Fed   Solid     Loni Beckwith, M.S. CCC-SLP Speech-Language Pathologist  Solid: Within functional limits Presentation: Spoon;Self Fed      Loni Beckwith 10/04/2019,12:12 PM

## 2019-10-05 ENCOUNTER — Other Ambulatory Visit: Payer: Self-pay

## 2019-10-05 LAB — GLUCOSE, CAPILLARY
Glucose-Capillary: 158 mg/dL — ABNORMAL HIGH (ref 70–99)
Glucose-Capillary: 172 mg/dL — ABNORMAL HIGH (ref 70–99)
Glucose-Capillary: 126 mg/dL — ABNORMAL HIGH (ref 70–99)

## 2019-10-05 LAB — CBC WITH DIFFERENTIAL/PLATELET
Abs Immature Granulocytes: 0.03 10*3/uL (ref 0.00–0.07)
Basophils Absolute: 0 10*3/uL (ref 0.0–0.1)
Basophils Relative: 0 %
Eosinophils Absolute: 0.1 10*3/uL (ref 0.0–0.5)
Eosinophils Relative: 1 %
HCT: 37.3 % (ref 36.0–46.0)
Hemoglobin: 12.6 g/dL (ref 12.0–15.0)
Immature Granulocytes: 0 %
Lymphocytes Relative: 37 %
Lymphs Abs: 2.6 10*3/uL (ref 0.7–4.0)
MCH: 29 pg (ref 26.0–34.0)
MCHC: 33.8 g/dL (ref 30.0–36.0)
MCV: 85.9 fL (ref 80.0–100.0)
Monocytes Absolute: 0.7 10*3/uL (ref 0.1–1.0)
Monocytes Relative: 9 %
Neutro Abs: 3.7 10*3/uL (ref 1.7–7.7)
Neutrophils Relative %: 53 %
Platelets: 234 10*3/uL (ref 150–400)
RBC: 4.34 MIL/uL (ref 3.87–5.11)
RDW: 13.2 % (ref 11.5–15.5)
WBC: 7.1 10*3/uL (ref 4.0–10.5)
nRBC: 0 % (ref 0.0–0.2)

## 2019-10-05 LAB — RENAL FUNCTION PANEL
Albumin: 3.1 g/dL — ABNORMAL LOW (ref 3.5–5.0)
Anion gap: 11 (ref 5–15)
BUN: 8 mg/dL (ref 6–20)
CO2: 22 mmol/L (ref 22–32)
Calcium: 8.6 mg/dL — ABNORMAL LOW (ref 8.9–10.3)
Chloride: 106 mmol/L (ref 98–111)
Creatinine, Ser: 0.97 mg/dL (ref 0.44–1.00)
GFR calc Af Amer: >60 mL/min (ref >60)
GFR calc non Af Amer: >60 mL/min (ref >60)
Glucose, Bld: 181 mg/dL — ABNORMAL HIGH (ref 70–99)
Phosphorus: 2.2 mg/dL — ABNORMAL LOW (ref 2.5–4.6)
Potassium: 3.4 mmol/L — ABNORMAL LOW (ref 3.5–5.1)
Sodium: 139 mmol/L (ref 135–145)

## 2019-10-05 LAB — PHOSPHORUS: Phosphorus: 2.3 mg/dL — ABNORMAL LOW (ref 2.5–4.6)

## 2019-10-05 LAB — MAGNESIUM: Magnesium: 2 mg/dL (ref 1.7–2.4)

## 2019-10-05 MED ORDER — AMLODIPINE BESYLATE 10 MG PO TABS
10.0000 mg | ORAL_TABLET | Freq: Every day | ORAL | Status: DC
  Administered 2019-10-05: 09:00:00 10 mg via ORAL
  Filled 2019-10-05: qty 1

## 2019-10-05 MED ORDER — ATORVASTATIN CALCIUM 20 MG PO TABS: 40.0000 mg | ORAL_TABLET | Freq: Every day | ORAL | Status: DC

## 2019-10-05 MED ORDER — ONDANSETRON 4 MG PO TBDP
4.0000 mg | ORAL_TABLET | Freq: Three times a day (TID) | ORAL | Status: DC | PRN
  Administered 2019-10-05: 12:00:00 4 mg via ORAL
  Filled 2019-10-05: qty 1

## 2019-10-05 MED ORDER — INSULIN ASPART 100 UNIT/ML ~~LOC~~ SOLN
0.0000 [IU] | Freq: Three times a day (TID) | SUBCUTANEOUS | Status: DC
  Administered 2019-10-05: 12:00:00 1 [IU] via SUBCUTANEOUS
  Filled 2019-10-05: qty 1

## 2019-10-05 MED ORDER — DULOXETINE HCL 30 MG PO CPEP
60.0000 mg | ORAL_CAPSULE | Freq: Every day | ORAL | Status: DC
  Administered 2019-10-05: 09:00:00 60 mg via ORAL
  Filled 2019-10-05: qty 2

## 2019-10-05 MED ORDER — POTASSIUM PHOSPHATES 15 MMOLE/5ML IV SOLN
20.0000 mmol | Freq: Once | INTRAVENOUS | Status: AC
  Administered 2019-10-05: 12:00:00 20 mmol via INTRAVENOUS
  Filled 2019-10-05: qty 6.67

## 2019-10-05 MED ORDER — HYDROCHLOROTHIAZIDE 25 MG PO TABS
25.0000 mg | ORAL_TABLET | Freq: Every day | ORAL | Status: DC
  Administered 2019-10-05: 12:00:00 25 mg via ORAL
  Filled 2019-10-05: qty 1

## 2019-10-05 MED ORDER — LISINOPRIL 20 MG PO TABS
20.0000 mg | ORAL_TABLET | Freq: Every day | ORAL | Status: DC
  Administered 2019-10-05: 12:00:00 20 mg via ORAL
  Filled 2019-10-05: qty 1

## 2019-10-05 NOTE — Progress Notes (Signed)
[name] Gilbert A and O X 4. VSS. Pt tolerating diet well. No complaints of pain or nausea. IV removed intact, prescriptions given. Pt voiced understanding of discharge instructions with no further questions. Pt discharged via wheelchair with RN.   Allergies as of 10/05/2019      Reactions   Penicillins Hives, Nausea And Vomiting, Swelling   Has patient had a PCN reaction causing immediate rash, facial/tongue/throat swelling, SOB or lightheadedness with hypotension: Yes Has patient had a PCN reaction causing severe rash involving mucus membranes or skin necrosis: No Has patient had a PCN reaction that required hospitalization Yes Has patient had a PCN reaction occurring within the last 10 years: No If all of the above answers are "NO", then may proceed with Cephalosporin use.   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   atorvastatin 40 MG tablet Commonly known as: LIPITOR Take 40 mg by mouth daily.   DULoxetine 20 MG capsule Commonly known as: CYMBALTA Take 60 mg by mouth daily.   insulin aspart 100 UNIT/ML FlexPen Commonly known as: NovoLOG FlexPen Inject 2 Units into the skin 3 (three) times daily with meals.   lisinopril-hydrochlorothiazide 20-25 MG tablet Commonly known as: ZESTORETIC Take 1 tablet by mouth daily.   Evaristo Bury FlexTouch 100 UNIT/ML Sopn FlexTouch Pen Generic drug: insulin degludec Inject 30 Units into the skin daily.   Victoza 18 MG/3ML Sopn Generic drug: liraglutide Inject 0.6 mg into the skin daily.       Vitals:   10/05/19 0616 10/05/19 1202  BP: (!) 185/90 (!) 155/73  Pulse: 84 74  Resp:  15  Temp:  98.7 F (37.1 C)  SpO2:  99%    [name] Gilbert

## 2019-10-05 NOTE — Progress Notes (Signed)
Order given to check CBG ac and hs ( was previously ordered for q4h)

## 2019-10-05 NOTE — Discharge Summary (Signed)
Physician Discharge Summary  [name] Gilbert XTG:[id number] DOB: [birth year] DOA: 10/01/2019  PCP: Tobey Grim, MD  Admit date: 10/01/2019 Discharge date: 10/05/2019  Admitted From: Home Disposition: Home  Recommendations for Outpatient Follow-up:  1. Follow up with PCP in 1-2 weeks 2. Please obtain BMP/CBC in one week 3. Please follow up on the following pending results: None  Home Health: No Equipment/Devices: None Discharge Condition: Stable  CODE STATUS: Full Diet recommendation: Heart Healthy / Carb Modified   Brief/Interim Summary: [name] Gilbert is a 59 year old female with a past medical history notable for diabetes mellitus, hypertension, CVA, and visual impairment who presented to [facility] ED on 10/01/2019 due to altered mental status and combativeness. Her mental status worsened ED and she started vomiting, intubated on 10/01/2019 for airway protection.  Extubated on 10/03/2019.  Saturating well on room air.  She was found to have hyperosmolar hyperglycemia which was initially treated with insulin gtt. later switched to long-acting and NovoLog. There is some noncompliance issues as her blood sugar responded very well to insulin during hospitalization.  She needs to follow-up with PCP for further management.  Did had some electrolyte derangements which includes hypokalemia and hypophosphatemia which were repleted.  Patient to have some underlying dementia secondary to her CVA.  At baseline now.  She was given some antibiotics initially for concern of aspiration, chest x-ray remained clear, procalcitonin was below 0.10 and antibiotics were discontinued.  Discharge Diagnoses:  Active Problems:   HHNC (hyperglycemic hyperosmolar nonketotic coma) (HCC)   Acute respiratory failure with hypoxia [facility])  Discharge Instructions  Discharge Instructions    Diet - low sodium heart healthy   Complete by: As directed    Increase activity slowly   Complete by: As directed       Allergies as of 10/05/2019      Reactions   Penicillins Hives, Nausea And Vomiting, Swelling   Has patient had a PCN reaction causing immediate rash, facial/tongue/throat swelling, SOB or lightheadedness with hypotension: Yes Has patient had a PCN reaction causing severe rash involving mucus membranes or skin necrosis: No Has patient had a PCN reaction that required hospitalization Yes Has patient had a PCN reaction occurring within the last 10 years: No If all of the above answers are "NO", then may proceed with Cephalosporin use.   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   atorvastatin 40 MG tablet Commonly known as: LIPITOR Take 40 mg by mouth daily.   DULoxetine 20 MG capsule Commonly known as: CYMBALTA Take 60 mg by mouth daily.   insulin aspart 100 UNIT/ML FlexPen Commonly known as: NovoLOG FlexPen Inject 2 Units into the skin 3 (three) times daily with meals.   lisinopril-hydrochlorothiazide 20-25 MG tablet Commonly known as: ZESTORETIC Take 1 tablet by mouth daily.   Evaristo Bury FlexTouch 100 UNIT/ML Sopn FlexTouch Pen Generic drug: insulin degludec Inject 30 Units into the skin daily.   Victoza 18 MG/3ML Sopn Generic drug: liraglutide Inject 0.6 mg into the skin daily.       Allergies  Allergen Reactions  . Penicillins Hives, Nausea And Vomiting and Swelling    Has patient had a PCN reaction causing immediate rash, facial/tongue/throat swelling, SOB or lightheadedness with  hypotension: Yes Has patient had a PCN reaction causing severe rash involving mucus membranes or skin necrosis: No Has patient had a PCN reaction that required hospitalization Yes Has patient had a PCN reaction occurring within the last 10  years: No If all of the above answers are "NO", then may proceed with Cephalosporin use.   . Codeine Other (See Comments)    Reaction: unknown  . Metformin Other (See Comments)    Reaction: unknown  . 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 Esophageal tube is looped over the gastric fundus. Low lung volumes. No focal airspace disease, pleural effusion or pneumothorax. Normal heart size IMPRESSION: 1. Endotracheal tube tip about 1.5 cm superior to carina 2. Esophageal tube tip overlies the gastric fundus. 3. Clear lung fields Electronically Signed   By: Jasmine PangKim  Fujinaga M.D.   On: 10/01/2019 22:56   DG Chest 1 View  Result Date: 10/01/2019 CLINICAL DATA:  Combative, altered mental status, stroke EXAM: CHEST  1 VIEW COMPARISON:  Portable exam 2026 hours compared to 10/31/2018 FINDINGS: Normal heart size, mediastinal contours, and pulmonary vascularity. Lungs clear. No pulmonary infiltrate, pleural effusion or pneumothorax. Bones unremarkable. IMPRESSION: No acute abnormalities. Electronically Signed   By: Ulyses SouthwardMark  Boles M.D.   On: 10/01/2019 20:56   CT Head Wo Contrast  Result Date: 10/01/2019 CLINICAL DATA:  Encephalopathy. Additional history provided: Patient arrives from home nonverbal but alert, combative, fixed EAC the right, history of stroke, moving all extremities EXAM: CT HEAD WITHOUT CONTRAST TECHNIQUE: Contiguous axial images were obtained from the base of the skull through the vertex without intravenous contrast. COMPARISON:  Head CT 10/31/2018 FINDINGS: Brain: The examination is moderately motion degraded, limiting evaluation. Within this limitation, no acute intracranial hemorrhage or acute demarcated cortical  infarction is identified. Redemonstrated chronic encephalomalacia within the left parietal and occipital lobes with associated ex vacuo dilatation of the posterior left lateral ventricle. Redemonstrated patchy chronic encephalomalacia within the superior right parietal lobe. Chronic heterogeneity within the left lentiform nucleus. Mild generalized parenchymal atrophy. There is no evidence of intracranial mass. No midline shift or extra-axial fluid collection. Vascular: No hyperdense vessel is identified. Atherosclerotic calcifications. Skull: Within the limitations of motion degradation, no calvarial fracture or suspicious osseous lesion is identified. Sinuses/Orbits: Rightward gaze. No significant paranasal sinus disease or mastoid effusion at the imaged levels. IMPRESSION: Moderate motion degradation limits evaluation. No evidence of acute intracranial abnormality. Unchanged chronic infarcts within the bilateral parietal lobes and left occipital lobe. Redemonstrated chronic small-vessel ischemic changes within the left deep gray nuclei. Electronically Signed   By: Jackey LogeKyle  Golden DO   On: 10/01/2019 21:36   DG Chest Port 1 View  Result Date: 10/02/2019 CLINICAL DATA:  Central line placement EXAM: PORTABLE CHEST 1 VIEW COMPARISON:  October 01, 2019 FINDINGS: The endotracheal tube terminates approximately 3.5 cm above the carina. There is a well-positioned left IJ central venous catheter with tip terminating near the cavoatrial junction. The enteric tube terminates over the gastric body. The lungs are essentially clear. There is no evidence for pneumothorax. No large pleural effusion. The heart size is normal. IMPRESSION: 1. Lines and tubes as above. 2. No pneumothorax. 3. The lungs are clear. Electronically Signed   By:  Constance Holster M.D.   On: 10/02/2019 02:36    Subjective: Was feeling better when seen this morning.  No new complaints.  Discharge Exam: Vitals:   10/05/19 0616 10/05/19 1202  BP: (!)  185/90 (!) 155/73  Pulse: 84 74  Resp:  15  Temp:  98.7 F (37.1 C)  SpO2:  99%   Vitals:   10/04/19 2304 10/05/19 0457 10/05/19 0616 10/05/19 1202  BP: (!) 150/75 (!) 189/88 (!) 185/90 (!) 155/73  Pulse: 72 84 84 74  Resp:  18  15  Temp:  98.2 F (36.8 C)  98.7 F (37.1 C)  TempSrc:  Oral  Oral  SpO2:  100%  99%  Weight:      Height:        General: Pt is alert, awake, not in acute distress Cardiovascular: RRR, S1/S2 +, no rubs, no gallops Respiratory: CTA bilaterally, no wheezing, no rhonchi Abdominal: Soft, NT, ND, bowel sounds + Extremities: no edema, no cyanosis   The results of significant diagnostics from this hospitalization (including imaging, microbiology, ancillary and laboratory) are listed below for reference.    Microbiology: Recent Results (from the past 240 hour(s))  Respiratory Panel by RT PCR (Flu A&B, Covid) - Nasopharyngeal Swab     Status: None   Collection Time: 10/01/19  8:53 PM   Specimen: Nasopharyngeal Swab  Result Value Ref Range Status   SARS Coronavirus 2 by RT PCR NEGATIVE NEGATIVE Final    Comment: (NOTE) SARS-CoV-2 target nucleic acids are NOT DETECTED. The SARS-CoV-2 RNA is generally detectable in upper respiratoy specimens during the acute phase of infection. The lowest concentration of SARS-CoV-2 viral copies this assay can detect is 131 copies/mL. A negative result does not preclude SARS-Cov-2 infection and should not be used as the sole basis for treatment or other patient management decisions. A negative result may occur with  improper specimen collection/handling, submission of specimen other than nasopharyngeal swab, presence of viral mutation(s) within the areas targeted by this assay, and inadequate number of viral copies (<131 copies/mL). A negative result must be combined with clinical observations, patient history, and epidemiological information. The expected result is Negative. Fact Sheet for Patients:   PinkCheek.be Fact Sheet for Healthcare Providers:  GravelBags.it This test is not yet ap proved or cleared by the [place] FDA and  has been authorized for detection and/or diagnosis of SARS-CoV-2 by FDA under an Emergency Use Authorization (EUA). This EUA will remain  in effect (meaning this test can be used) for the duration of the COVID-19 declaration under Section 564(b)(1) of the Act, 21 U.S.C. section 360bbb-3(b)(1), unless the authorization is terminated or revoked sooner.    Influenza A by PCR NEGATIVE NEGATIVE Final   Influenza B by PCR NEGATIVE NEGATIVE Final    Comment: (NOTE) The Xpert Xpress SARS-CoV-2/FLU/RSV assay is intended as an aid in  the diagnosis of influenza from Nasopharyngeal swab specimens and  should not be used as a sole basis for treatment. Nasal washings and  aspirates are unacceptable for Xpert Xpress SARS-CoV-2/FLU/RSV  testing. Fact Sheet for Patients: PinkCheek.be Fact Sheet for Healthcare Providers: GravelBags.it This test is not yet approved or cleared by the [place] FDA and  has been authorized for detection and/or diagnosis of SARS-CoV-2 by  FDA under an Emergency Use Authorization (EUA). This EUA will remain  in effect (meaning this test can be used) for the duration of the  Covid-19 declaration under Section 564(b)(1) of the Act, 21  U.S.C. section  360bbb-3(b)(1), unless the authorization is  terminated or revoked. Performed at [facility], [address] [place], [place] [address]   MRSA PCR Screening     Status: None   Collection Time: 10/02/19 12:18 AM   Specimen: Nasopharyngeal  Result Value Ref Range Status   MRSA by PCR NEGATIVE NEGATIVE Final    Comment:        The GeneXpert MRSA Assay (FDA approved for NASAL specimens only), is one component of a comprehensive MRSA colonization surveillance  program. It is not intended to diagnose MRSA infection nor to guide or monitor treatment for MRSA infections. Performed at [facility], [address] [place], [place] [address]      Labs: BNP (last 3 results) No results for input(s): BNP in the last 8760 hours. Basic Metabolic Panel: Recent Labs  Lab 10/02/19 0240 10/02/19 0645 10/03/19 0427 10/04/19 0456 10/05/19 0735  NA 138 138 138 140 139  K 3.9 4.2 4.0 3.2* 3.4*  CL 106 106 110 110 106  CO2 24 25 23 25 22   GLUCOSE 185* 214* 146* 132* 181*  BUN 25* 26* 23* 14 8  CREATININE 1.56* 1.32* 1.25* 1.06* 0.97  CALCIUM 8.0* 7.8* 7.4* 7.8* 8.6*  MG  --   --   --  1.7 2.0  PHOS  --   --   --  2.1* 2.3*  2.2*   Liver Function Tests: Recent Labs  Lab 10/01/19 2038 10/05/19 0735  AST 32  --   ALT 22  --   ALKPHOS 168*  --   BILITOT 0.7  --   PROT 8.5*  --   ALBUMIN 3.5 3.1*   No results for input(s): LIPASE, AMYLASE in the last 168 hours. No results for input(s): AMMONIA in the last 168 hours. CBC: Recent Labs  Lab 10/01/19 2038 10/01/19 2038 10/02/19 0511 10/03/19 0427 10/03/19 1456 10/04/19 0456 10/05/19 0735  WBC 6.7   < > 13.5* 8.1 8.4 7.6 7.1  NEUTROABS 2.9  --   --   --  5.6 4.7 3.7  HGB 12.9   < > 12.0 10.6* 10.4* 10.7* 12.6  HCT 38.4   < > 34.9* 31.2* 30.7* 31.3* 37.3  MCV 85.7   < > 84.1 86.2 86.0 85.3 85.9  PLT 210   < > 237 177 168 198 234   < > = values in this interval not displayed.   Cardiac Enzymes: No results for input(s): CKTOTAL, CKMB, CKMBINDEX, TROPONINI in the last 168 hours. BNP: Invalid input(s): POCBNP CBG: Recent Labs  Lab 10/04/19 1621 10/04/19 2306 10/05/19 0501 10/05/19 0747 10/05/19 1157  GLUCAP 173* 80 158* 172* 126*   D-Dimer No results for input(s): DDIMER in the last 72 hours. Hgb A1c No results for input(s): HGBA1C in the last 72 hours. Lipid Profile No results for input(s): CHOL, HDL, LDLCALC, TRIG, CHOLHDL, LDLDIRECT in the last 72  hours. Thyroid function studies No results for input(s): TSH, T4TOTAL, T3FREE, THYROIDAB in the last 72 hours.  Invalid input(s): FREET3 Anemia work up No results for input(s): VITAMINB12, FOLATE, FERRITIN, TIBC, IRON, RETICCTPCT in the last 72 hours. Urinalysis    Component Value Date/Time   COLORURINE COLORLESS (A) 10/01/2019 2038   APPEARANCEUR CLEAR (A) 10/01/2019 2038   APPEARANCEUR Clear 12/06/2014 0133   LABSPEC 1.018 10/01/2019 2038   LABSPEC 1.011 12/06/2014 0133   PHURINE 7.0 10/01/2019 2038   GLUCOSEU >=500 (A) 10/01/2019 2038   GLUCOSEU >=500 12/06/2014 0133   HGBUR SMALL (  A) 10/01/2019 2038   BILIRUBINUR NEGATIVE 10/01/2019 2038   BILIRUBINUR Negative 12/06/2014 0133   KETONESUR NEGATIVE 10/01/2019 2038   PROTEINUR 30 (A) 10/01/2019 2038   NITRITE NEGATIVE 10/01/2019 2038   LEUKOCYTESUR NEGATIVE 10/01/2019 2038   LEUKOCYTESUR Negative 12/06/2014 0133   Sepsis Labs Invalid input(s): PROCALCITONIN,  WBC,  LACTICIDVEN Microbiology Recent Results (from the past 240 hour(s))  Respiratory Panel by RT PCR (Flu A&B, Covid) - Nasopharyngeal Swab     Status: None   Collection Time: 10/01/19  8:53 PM   Specimen: Nasopharyngeal Swab  Result Value Ref Range Status   SARS Coronavirus 2 by RT PCR NEGATIVE NEGATIVE Final    Comment: (NOTE) SARS-CoV-2 target nucleic acids are NOT DETECTED. The SARS-CoV-2 RNA is generally detectable in upper respiratoy specimens during the acute phase of infection. The lowest concentration of SARS-CoV-2 viral copies this assay can detect is 131 copies/mL. A negative result does not preclude SARS-Cov-2 infection and should not be used as the sole basis for treatment or other patient management decisions. A negative result may occur with  improper specimen collection/handling, submission of specimen other than nasopharyngeal swab, presence of viral mutation(s) within the areas targeted by this assay, and inadequate number of viral copies (<131  copies/mL). A negative result must be combined with clinical observations, patient history, and epidemiological information. The expected result is Negative. Fact Sheet for Patients:  https://www.moore.com/ Fact Sheet for Healthcare Providers:  https://www.young.biz/ This test is not yet ap proved or cleared by the [place] FDA and  has been authorized for detection and/or diagnosis of SARS-CoV-2 by FDA under an Emergency Use Authorization (EUA). This EUA will remain  in effect (meaning this test can be used) for the duration of the COVID-19 declaration under Section 564(b)(1) of the Act, 21 U.S.C. section 360bbb-3(b)(1), unless the authorization is terminated or revoked sooner.    Influenza A by PCR NEGATIVE NEGATIVE Final   Influenza B by PCR NEGATIVE NEGATIVE Final    Comment: (NOTE) The Xpert Xpress SARS-CoV-2/FLU/RSV assay is intended as an aid in  the diagnosis of influenza from Nasopharyngeal swab specimens and  should not be used as a sole basis for treatment. Nasal washings and  aspirates are unacceptable for Xpert Xpress SARS-CoV-2/FLU/RSV  testing. Fact Sheet for Patients: https://www.moore.com/ Fact Sheet for Healthcare Providers: https://www.young.biz/ This test is not yet approved or cleared by the [place] FDA and  has been authorized for detection and/or diagnosis of SARS-CoV-2 by  FDA under an Emergency Use Authorization (EUA). This EUA will remain  in effect (meaning this test can be used) for the duration of the  Covid-19 declaration under Section 564(b)(1) of the Act, 21  U.S.C. section 360bbb-3(b)(1), unless the authorization is  terminated or revoked. Performed at [facility], [address] [place], [place] [address]   MRSA PCR Screening     Status: None   Collection Time: 10/02/19 12:18 AM   Specimen: Nasopharyngeal  Result Value Ref Range Status   MRSA by  PCR NEGATIVE NEGATIVE Final    Comment:        The GeneXpert MRSA Assay (FDA approved for NASAL specimens only), is one component of a comprehensive MRSA colonization surveillance program. It is not intended to diagnose MRSA infection nor to guide or monitor treatment for MRSA infections. Performed at [facility], [address] [place], [place] [address]     Time coordinating discharge: Over 30 minutes  SIGNED:  Arnetha Courser, MD  [facility]  10/05/2019, 3:30 PM Pager [phone]  If 7PM-7AM, please contact night-coverage [email or web address] Password [email or web address]  This record has been created using Conservation officer, historic buildingsDragon voice recognition software. Errors have been sought and corrected,but may not always be located. Such creation errors do not reflect on the standard of care.

## 2019-10-06 LAB — GLUCOSE, CAPILLARY: Glucose-Capillary: 37 mg/dL — CL (ref 70–99)

## 2021-06-12 ENCOUNTER — Observation Stay: Payer: Medicare HMO

## 2021-06-12 ENCOUNTER — Inpatient Hospital Stay
Admission: EM | Admit: 2021-06-12 | Discharge: 2021-06-15 | DRG: 638 | Disposition: A | Discharge status: Home / Self Care | Payer: Medicare HMO | Attending: Internal Medicine | Admitting: Internal Medicine

## 2021-06-12 ENCOUNTER — Inpatient Hospital Stay (HOSPITAL_COMMUNITY)
Admission: AD | Admit: 2021-06-12 | Payer: Medicare HMO | Source: Other Acute Inpatient Hospital | Admitting: Internal Medicine

## 2021-06-12 DIAGNOSIS — T68XXXA Hypothermia, initial encounter: Secondary | ICD-10-CM | POA: Diagnosis not present

## 2021-06-12 DIAGNOSIS — E119 Type 2 diabetes mellitus without complications: Secondary | ICD-10-CM

## 2021-06-12 DIAGNOSIS — E1122 Type 2 diabetes mellitus with diabetic chronic kidney disease: Secondary | ICD-10-CM | POA: Diagnosis present

## 2021-06-12 DIAGNOSIS — R1013 Epigastric pain: Secondary | ICD-10-CM

## 2021-06-12 DIAGNOSIS — Z6832 Body mass index (BMI) 32.0-32.9, adult: Secondary | ICD-10-CM

## 2021-06-12 DIAGNOSIS — E663 Overweight: Secondary | ICD-10-CM | POA: Diagnosis present

## 2021-06-12 DIAGNOSIS — E11649 Type 2 diabetes mellitus with hypoglycemia without coma: Secondary | ICD-10-CM | POA: Diagnosis not present

## 2021-06-12 DIAGNOSIS — R680 Hypothermia, not associated with low environmental temperature: Secondary | ICD-10-CM | POA: Diagnosis present

## 2021-06-12 DIAGNOSIS — E1169 Type 2 diabetes mellitus with other specified complication: Secondary | ICD-10-CM

## 2021-06-12 DIAGNOSIS — E871 Hypo-osmolality and hyponatremia: Secondary | ICD-10-CM | POA: Diagnosis present

## 2021-06-12 DIAGNOSIS — I129 Hypertensive chronic kidney disease with stage 1 through stage 4 chronic kidney disease, or unspecified chronic kidney disease: Secondary | ICD-10-CM | POA: Diagnosis present

## 2021-06-12 DIAGNOSIS — Z833 Family history of diabetes mellitus: Secondary | ICD-10-CM

## 2021-06-12 DIAGNOSIS — N1831 Chronic kidney disease, stage 3a: Secondary | ICD-10-CM | POA: Diagnosis present

## 2021-06-12 DIAGNOSIS — I10 Essential (primary) hypertension: Secondary | ICD-10-CM | POA: Diagnosis present

## 2021-06-12 DIAGNOSIS — R519 Headache, unspecified: Secondary | ICD-10-CM

## 2021-06-12 DIAGNOSIS — I69354 Hemiplegia and hemiparesis following cerebral infarction affecting left non-dominant side: Secondary | ICD-10-CM

## 2021-06-12 DIAGNOSIS — Z885 Allergy status to narcotic agent status: Secondary | ICD-10-CM

## 2021-06-12 DIAGNOSIS — R569 Unspecified convulsions: Secondary | ICD-10-CM | POA: Diagnosis not present

## 2021-06-12 DIAGNOSIS — E860 Dehydration: Secondary | ICD-10-CM | POA: Diagnosis present

## 2021-06-12 DIAGNOSIS — Z79899 Other long term (current) drug therapy: Secondary | ICD-10-CM

## 2021-06-12 DIAGNOSIS — E162 Hypoglycemia, unspecified: Secondary | ICD-10-CM | POA: Diagnosis present

## 2021-06-12 DIAGNOSIS — Z888 Allergy status to other drugs, medicaments and biological substances status: Secondary | ICD-10-CM

## 2021-06-12 DIAGNOSIS — Z794 Long term (current) use of insulin: Secondary | ICD-10-CM | POA: Diagnosis not present

## 2021-06-12 DIAGNOSIS — R29818 Other symptoms and signs involving the nervous system: Secondary | ICD-10-CM

## 2021-06-12 DIAGNOSIS — Z20822 Contact with and (suspected) exposure to covid-19: Secondary | ICD-10-CM | POA: Diagnosis present

## 2021-06-12 DIAGNOSIS — N179 Acute kidney failure, unspecified: Secondary | ICD-10-CM | POA: Diagnosis present

## 2021-06-12 DIAGNOSIS — R109 Unspecified abdominal pain: Secondary | ICD-10-CM | POA: Diagnosis present

## 2021-06-12 DIAGNOSIS — Z880 Allergy status to penicillin: Secondary | ICD-10-CM

## 2021-06-12 LAB — COMPREHENSIVE METABOLIC PANEL
ALT: 14 U/L (ref 0–44)
AST: 24 U/L (ref 15–41)
Albumin: 3 g/dL — ABNORMAL LOW (ref 3.5–5.0)
Alkaline Phosphatase: 91 U/L (ref 38–126)
Anion gap: 5 (ref 5–15)
BUN: 37 mg/dL — ABNORMAL HIGH (ref 8–23)
CO2: 27 mmol/L (ref 22–32)
Calcium: 8.2 mg/dL — ABNORMAL LOW (ref 8.9–10.3)
Chloride: 105 mmol/L (ref 98–111)
Creatinine, Ser: 1.69 mg/dL — ABNORMAL HIGH (ref 0.44–1.00)
GFR, Estimated: 34 mL/min — ABNORMAL LOW (ref >60)
Glucose, Bld: 262 mg/dL — ABNORMAL HIGH (ref 70–99)
Potassium: 4 mmol/L (ref 3.5–5.1)
Sodium: 137 mmol/L (ref 135–145)
Total Bilirubin: 0.6 mg/dL (ref 0.3–1.2)
Total Protein: 7.1 g/dL (ref 6.5–8.1)

## 2021-06-12 LAB — URINALYSIS, COMPLETE (UACMP) WITH MICROSCOPIC
Bacteria, UA: NONE SEEN
Bilirubin Urine: NEGATIVE
Glucose, UA: >=500 mg/dL — AB
Hgb urine dipstick: NEGATIVE
Ketones, ur: NEGATIVE mg/dL
Leukocytes,Ua: NEGATIVE
Nitrite: NEGATIVE
Protein, ur: 100 mg/dL — AB
Specific Gravity, Urine: 1.014 (ref 1.005–1.030)
Squamous Epithelial / HPF: NONE SEEN (ref 0–5)
pH: 5 (ref 5.0–8.0)

## 2021-06-12 LAB — TROPONIN I (HIGH SENSITIVITY)
Troponin I (High Sensitivity): 11 ng/L (ref <18)
Troponin I (High Sensitivity): 11 ng/L (ref <18)

## 2021-06-12 LAB — CBC
HCT: 33.2 % — ABNORMAL LOW (ref 36.0–46.0)
Hemoglobin: 10.9 g/dL — ABNORMAL LOW (ref 12.0–15.0)
MCH: 29.1 pg (ref 26.0–34.0)
MCHC: 32.8 g/dL (ref 30.0–36.0)
MCV: 88.5 fL (ref 80.0–100.0)
Platelets: 233 10*3/uL (ref 150–400)
RBC: 3.75 MIL/uL — ABNORMAL LOW (ref 3.87–5.11)
RDW: 13.7 % (ref 11.5–15.5)
WBC: 7.6 10*3/uL (ref 4.0–10.5)
nRBC: 0 % (ref 0.0–0.2)

## 2021-06-12 LAB — APTT: aPTT: 30 seconds (ref 24–36)

## 2021-06-12 LAB — URINE DRUG SCREEN, QUALITATIVE (ARMC ONLY)
Amphetamines, Ur Screen: NOT DETECTED
Barbiturates, Ur Screen: NOT DETECTED
Benzodiazepine, Ur Scrn: NOT DETECTED
Cannabinoid 50 Ng, Ur ~~LOC~~: NOT DETECTED
Cocaine Metabolite,Ur ~~LOC~~: NOT DETECTED
MDMA (Ecstasy)Ur Screen: NOT DETECTED
Methadone Scn, Ur: NOT DETECTED
Opiate, Ur Screen: NOT DETECTED
Phencyclidine (PCP) Ur S: NOT DETECTED
Tricyclic, Ur Screen: NOT DETECTED

## 2021-06-12 LAB — RESP PANEL BY RT-PCR (FLU A&B, COVID) ARPGX2
Influenza A by PCR: NEGATIVE
Influenza B by PCR: NEGATIVE
SARS Coronavirus 2 by RT PCR: NEGATIVE

## 2021-06-12 LAB — LIPASE, BLOOD: Lipase: 21 U/L (ref 11–51)

## 2021-06-12 LAB — PROTIME-INR
INR: 1.1 (ref 0.8–1.2)
Prothrombin Time: 14 seconds (ref 11.4–15.2)

## 2021-06-12 LAB — CBG MONITORING, ED
Glucose-Capillary: 175 mg/dL — ABNORMAL HIGH (ref 70–99)
Glucose-Capillary: 65 mg/dL — ABNORMAL LOW (ref 70–99)
Glucose-Capillary: 149 mg/dL — ABNORMAL HIGH (ref 70–99)
Glucose-Capillary: 142 mg/dL — ABNORMAL HIGH (ref 70–99)
Glucose-Capillary: 107 mg/dL — ABNORMAL HIGH (ref 70–99)
Glucose-Capillary: 70 mg/dL (ref 70–99)
Glucose-Capillary: 106 mg/dL — ABNORMAL HIGH (ref 70–99)
Glucose-Capillary: 61 mg/dL — ABNORMAL LOW (ref 70–99)
Glucose-Capillary: 118 mg/dL — ABNORMAL HIGH (ref 70–99)
Glucose-Capillary: 87 mg/dL (ref 70–99)

## 2021-06-12 MED ORDER — SPIRONOLACTONE 25 MG PO TABS: 150.0000 mg | ORAL_TABLET | Freq: Every day | ORAL | Status: DC

## 2021-06-12 MED ORDER — CLONIDINE HCL 0.1 MG PO TABS
0.2000 mg | ORAL_TABLET | Freq: Two times a day (BID) | ORAL | Status: DC
  Administered 2021-06-12 – 2021-06-13 (×2): 0.2 mg via ORAL
  Filled 2021-06-12 (×2): qty 2

## 2021-06-12 MED ORDER — ATORVASTATIN CALCIUM 20 MG PO TABS: 40.0000 mg | ORAL_TABLET | Freq: Every day | ORAL | Status: DC

## 2021-06-12 MED ORDER — DEXTROSE 50 % IV SOLN
25.0000 mL | Freq: Once | INTRAVENOUS | Status: AC
  Administered 2021-06-12: 25 mL via INTRAVENOUS

## 2021-06-12 MED ORDER — KETOROLAC TROMETHAMINE 30 MG/ML IJ SOLN: 30.0000 mg | Freq: Four times a day (QID) | INTRAMUSCULAR | Status: DC | PRN

## 2021-06-12 MED ORDER — SODIUM CHLORIDE 0.9 % IV SOLN
20.0000 mg/kg | Freq: Once | INTRAVENOUS | Status: AC
  Administered 2021-06-12: 1630 mg via INTRAVENOUS
  Filled 2021-06-12: qty 16.3

## 2021-06-12 MED ORDER — ACETAMINOPHEN 500 MG PO TABS: 1000.0000 mg | ORAL_TABLET | Freq: Once | ORAL | Status: DC

## 2021-06-12 MED ORDER — SODIUM CHLORIDE 0.45 % IV SOLN: INTRAVENOUS | Status: DC

## 2021-06-12 MED ORDER — DEXTROSE 50 % IV SOLN
INTRAVENOUS | Status: AC
  Filled 2021-06-12: qty 50

## 2021-06-12 MED ORDER — LORAZEPAM 2 MG/ML IJ SOLN
2.0000 mg | Freq: Once | INTRAMUSCULAR | Status: AC
  Administered 2021-06-12: 2 mg via INTRAVENOUS
  Filled 2021-06-12: qty 1

## 2021-06-12 MED ORDER — ASPIRIN 81 MG PO CHEW
81.0000 mg | CHEWABLE_TABLET | Freq: Every morning | ORAL | Status: DC
  Administered 2021-06-12 – 2021-06-15 (×4): 81 mg via ORAL
  Filled 2021-06-12 (×4): qty 1

## 2021-06-12 MED ORDER — BUTALBITAL-APAP-CAFFEINE 50-325-40 MG PO TABS
2.0000 | ORAL_TABLET | Freq: Once | ORAL | Status: AC
  Administered 2021-06-12: 2 via ORAL
  Filled 2021-06-12: qty 2

## 2021-06-12 MED ORDER — INSULIN GLARGINE-YFGN 100 UNIT/ML ~~LOC~~ SOLN
10.0000 [IU] | Freq: Every day | SUBCUTANEOUS | Status: DC
  Filled 2021-06-12 (×2): qty 0.1

## 2021-06-12 MED ORDER — INSULIN DEGLUDEC 100 UNIT/ML ~~LOC~~ SOPN: 30.0000 [IU] | PEN_INJECTOR | Freq: Every day | SUBCUTANEOUS | Status: DC

## 2021-06-12 MED ORDER — DULOXETINE HCL 60 MG PO CPEP: 60.0000 mg | ORAL_CAPSULE | Freq: Every day | ORAL | Status: DC

## 2021-06-12 MED ORDER — PREGABALIN 75 MG PO CAPS
75.0000 mg | ORAL_CAPSULE | Freq: Two times a day (BID) | ORAL | Status: DC
  Administered 2021-06-12 – 2021-06-15 (×3): 75 mg via ORAL
  Filled 2021-06-12 (×5): qty 1

## 2021-06-12 MED ORDER — SODIUM CHLORIDE 0.45 % IV SOLN: INTRAVENOUS | Status: AC

## 2021-06-12 MED ORDER — ACETAMINOPHEN 500 MG PO TABS: 1000.0000 mg | ORAL_TABLET | Freq: Three times a day (TID) | ORAL | Status: DC | PRN

## 2021-06-12 MED ORDER — ENOXAPARIN SODIUM 40 MG/0.4ML IJ SOSY
40.0000 mg | PREFILLED_SYRINGE | INTRAMUSCULAR | Status: DC
  Administered 2021-06-12 – 2021-06-14 (×3): 40 mg via SUBCUTANEOUS
  Filled 2021-06-12 (×3): qty 0.4

## 2021-06-12 MED ORDER — INSULIN ASPART 100 UNIT/ML IJ SOLN
2.0000 [IU] | Freq: Three times a day (TID) | INTRAMUSCULAR | Status: DC
  Administered 2021-06-14: 2 [IU] via SUBCUTANEOUS
  Filled 2021-06-12: qty 1

## 2021-06-12 MED ORDER — AMLODIPINE BESYLATE 10 MG PO TABS
10.0000 mg | ORAL_TABLET | Freq: Every day | ORAL | Status: DC
  Administered 2021-06-12 – 2021-06-13 (×2): 10 mg via ORAL
  Filled 2021-06-12 (×2): qty 2

## 2021-06-12 MED ORDER — HYDRALAZINE HCL 50 MG PO TABS
25.0000 mg | ORAL_TABLET | Freq: Three times a day (TID) | ORAL | Status: DC
  Administered 2021-06-13: 25 mg via ORAL
  Filled 2021-06-12: qty 1

## 2021-06-12 MED ORDER — CARVEDILOL 12.5 MG PO TABS
25.0000 mg | ORAL_TABLET | Freq: Two times a day (BID) | ORAL | Status: DC
  Administered 2021-06-12: 25 mg via ORAL
  Filled 2021-06-12 (×2): qty 1

## 2021-06-12 MED ORDER — INSULIN ASPART 100 UNIT/ML IJ SOLN
0.0000 [IU] | Freq: Three times a day (TID) | INTRAMUSCULAR | Status: DC
  Administered 2021-06-13: 3 [IU] via SUBCUTANEOUS
  Administered 2021-06-14: 15 [IU] via SUBCUTANEOUS
  Administered 2021-06-14: 2 [IU] via SUBCUTANEOUS
  Filled 2021-06-12 (×3): qty 1

## 2021-06-12 MED ORDER — LEVETIRACETAM IN NACL 500 MG/100ML IV SOLN
500.0000 mg | Freq: Two times a day (BID) | INTRAVENOUS | Status: DC
  Administered 2021-06-13 – 2021-06-14 (×3): 500 mg via INTRAVENOUS
  Filled 2021-06-12 (×5): qty 100

## 2021-06-12 MED ORDER — HYDROCHLOROTHIAZIDE 25 MG PO TABS
50.0000 mg | ORAL_TABLET | Freq: Every day | ORAL | Status: DC
  Administered 2021-06-12: 50 mg via ORAL
  Filled 2021-06-12: qty 2

## 2021-06-12 MED ORDER — SODIUM CHLORIDE 0.9 % IV BOLUS
1000.0000 mL | Freq: Once | INTRAVENOUS | Status: AC
  Administered 2021-06-12: 1000 mL via INTRAVENOUS

## 2021-06-12 MED ORDER — ACETAMINOPHEN 650 MG RE SUPP
650.0000 mg | Freq: Four times a day (QID) | RECTAL | Status: DC | PRN
  Filled 2021-06-12: qty 1

## 2021-06-12 MED ORDER — ROSUVASTATIN CALCIUM 10 MG PO TABS
20.0000 mg | ORAL_TABLET | Freq: Every day | ORAL | Status: DC
  Administered 2021-06-14: 20 mg via ORAL
  Filled 2021-06-12: qty 1
  Filled 2021-06-12: qty 2
  Filled 2021-06-12: qty 1

## 2021-06-12 MED ORDER — KETOROLAC TROMETHAMINE 30 MG/ML IJ SOLN
30.0000 mg | Freq: Once | INTRAMUSCULAR | Status: AC
  Administered 2021-06-12: 30 mg via INTRAVENOUS
  Filled 2021-06-12: qty 1

## 2021-06-12 MED ORDER — AMLODIPINE BESYLATE 5 MG PO TABS: 10.0000 mg | ORAL_TABLET | Freq: Every day | ORAL | Status: DC

## 2021-06-12 MED ORDER — ACETAMINOPHEN 325 MG PO TABS
650.0000 mg | ORAL_TABLET | Freq: Four times a day (QID) | ORAL | Status: DC | PRN
  Filled 2021-06-12: qty 2

## 2021-06-12 MED ORDER — LIRAGLUTIDE 18 MG/3ML ~~LOC~~ SOPN: 0.6000 mg | PEN_INJECTOR | Freq: Every day | SUBCUTANEOUS | Status: DC

## 2021-06-12 NOTE — Progress Notes (Signed)
A consult was placed to IV Therapy requesting to place a PIV, so that the central line that was placed this a.m.could be removed;  both arms assessed w ultrasound; attempted x 1 in RAFA but was unable to thread the catheter;  pt stated " I always have to have a central line;"  pt also assessed by the IV Team Picc RN ; no suitable veins noted for picc or midline at this time;  RN made aware.

## 2021-06-12 NOTE — H&P (Addendum)
/ History and Physical    [name] Gilbert IDP:[id number] DOB: 07/28/1961 DOA: 06/12/2021  PCP: Tobey Grim, MD   Patient coming from: home  I have personally briefly reviewed patient's old medical records in [facility]  Chief Complaint: unresponsive  HPI: [name] Gilbert is a 60 y.o. female with medical history significant of DM on insulin, HTN, h/o CVA was found to be unresponsive at home. EMS activated. On arrival EMT found patient to have serum glucose of 30. She has poor vascular access and IV line could not be established. She is transported to [facility] for emergent treatment.   ED Course:  T 96.5  137/69  HR 63  RR 15. Patient was unresponsive. CBG 18. EDP placed right femoral IV and D50 was administered. Bear Hugger ordered for hypothermia. Lab revealed glucose (after administration of D50) of 262. Cr 1.69 (baseline approx 1.0), BUN 37. Patient became more responsive. She c/o headache - Fioricet given w/o relief. IVF ordered and Ketorolac IV. TRH called to admit for continued management.   Review of Systems: As per HPI otherwise 10 point review of systems negative. Persistent decreased sensation left vs right LE. Poor appetite   Past Medical History:  Diagnosis Date   Diabetes mellitus without complication (HCC)    Hypertension    Stroke (HCC)    visual impaired     Past Surgical History:  Procedure Laterality Date   ABDOMINAL HYSTERECTOMY     TONSILLECTOMY       reports that she has never smoked. She has never used smokeless tobacco. She reports that she does not drink alcohol and does not use drugs.  Allergies  Allergen Reactions   Penicillins Hives, Nausea And Vomiting and Swelling    Has patient had a PCN reaction causing immediate rash, facial/tongue/throat swelling, SOB or lightheadedness with hypotension: Yes Has patient had a PCN reaction causing severe rash involving mucus membranes or skin necrosis: No Has patient had a PCN reaction that required  hospitalization Yes Has patient had a PCN reaction occurring within the last 10 years: No If all of the above answers are "NO", then may proceed with Cephalosporin use.    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 Authorizing Provider  amLODipine (NORVASC) 10 MG tablet Take 10 mg by mouth daily. 09/28/19   [provider]  atorvastatin (LIPITOR) 40 MG tablet Take 40 mg by mouth daily.    [provider]  DULoxetine (CYMBALTA) 20 MG capsule Take 60 mg by mouth daily. 04/21/19   [provider]  insulin aspart (NOVOLOG FLEXPEN) 100 UNIT/ML FlexPen Inject 2 Units into the skin 3 (three) times daily with meals. 11/01/18   Alford Highland, MD  insulin degludec (TRESIBA FLEXTOUCH) 100 UNIT/ML SOPN FlexTouch Pen Inject 30 Units into the skin daily. 08/11/19   [provider]  liraglutide (VICTOZA) 18 MG/3ML SOPN Inject 0.6 mg into the skin daily. 02/05/19   [provider]    Physical Exam: Vitals:   06/12/21 0530 06/12/21 0545 06/12/21 0600 06/12/21 0839  BP: 96/79 96/79 137/69   Pulse: 65 65 63  Resp:  15    Temp:  (!) 95.8 F (35.4 C) (!) 96.5 F (35.8 C)   TempSrc:  Rectal    SpO2: 99% 100% 100%   Weight:    81.6 kg  Height:    5\' 2"  (1.575 m)     Vitals:   06/12/21 0530 06/12/21 0545 06/12/21 0600 06/12/21 0839  BP: 96/79 96/79 137/69   Pulse: 65 65 63   Resp:  15    Temp:  (!) 95.8 F (35.4 C) (!) 96.5 F (35.8 C)   TempSrc:  Rectal    SpO2: 99% 100% 100%   Weight:    81.6 kg  Height:    5\' 2"  (1.575 m)   General: over weight woman who is uncomfortable due to a HA.  Eyes: PERRL, lids and conjunctivae normal ENMT: Mucous  membranes are moist. Posterior pharynx clear of any exudate or lesions. Neck: normal, supple, no masses, no thyromegaly Respiratory: clear to auscultation bilaterally, no wheezing, no crackles. Normal respiratory effort. No accessory muscle use.  Cardiovascular: Regular rate and rhythm, no murmurs / rubs / gallops. No extremity edema. 2+ pedal pulses. No carotid bruits.  Abdomen:  tenderness to deep palpation epigastrum, RLQ, no masses palpated. No hepatosplenomegaly. Bowel sounds positive.  Musculoskeletal: no clubbing / cyanosis. No joint deformity upper and lower extremities. Good ROM, no contractures. Normal muscle tone.  Skin: no rashes, lesions, ulcers. No induration Neurologic: CN 2-12 grossly intact. Sensation intact, DTR normal. Strength 5/5 in all 4.  Psychiatric: Normal judgment and insight. Alert and oriented x 3. Normal mood.     Labs on Admission: I have personally reviewed following labs and imaging studies  CBC: Recent Labs  Lab 06/12/21 0342  WBC 7.6  HGB 10.9*  HCT 33.2*  MCV 88.5  PLT 233   Basic Metabolic Panel: Recent Labs  Lab 06/12/21 0342  NA 137  K 4.0  CL 105  CO2 27  GLUCOSE 262*  BUN 37*  CREATININE 1.69*  CALCIUM 8.2*   GFR: Estimated Creatinine Clearance: 34.2 mL/min (A) (by C-G formula based on SCr of 1.69 mg/dL (H)). Liver Function Tests: Recent Labs  Lab 06/12/21 0342  AST 24  ALT 14  ALKPHOS 91  BILITOT 0.6  PROT 7.1  ALBUMIN 3.0*   No results for input(s): LIPASE, AMYLASE in the last 168 hours. No results for input(s): AMMONIA in the last 168 hours. Coagulation Profile: No results for input(s): INR, PROTIME in the last 168 hours. Cardiac Enzymes: No results for input(s): CKTOTAL, CKMB, CKMBINDEX, TROPONINI in the last 168 hours. BNP (last 3 results) No results for input(s): PROBNP in the last 8760 hours. HbA1C: No results for input(s): HGBA1C in the last 72 hours. CBG: Recent Labs  Lab 06/12/21 0330 06/12/21 0354  06/12/21 0459 06/12/21 0636 06/12/21 0819  GLUCAP 65* 175* 149* 142* 107*   Lipid Profile: No results for input(s): CHOL, HDL, LDLCALC, TRIG, CHOLHDL, LDLDIRECT in the last 72 hours. Thyroid Function Tests: No results for input(s): TSH, T4TOTAL, FREET4, T3FREE, THYROIDAB in the last 72 hours. Anemia Panel: No results for input(s): VITAMINB12, FOLATE, FERRITIN, TIBC, IRON, RETICCTPCT in the last 72 hours. Urine analysis:    Component Value Date/Time   COLORURINE YELLOW (A) 06/12/2021 0342   APPEARANCEUR CLEAR (A) 06/12/2021 0342   APPEARANCEUR Clear 12/06/2014 0133   LABSPEC 1.014 06/12/2021 0342   LABSPEC 1.011 12/06/2014 0133   PHURINE 5.0 06/12/2021 0342   GLUCOSEU >=500 (A) 06/12/2021 0342   GLUCOSEU >=  500 12/06/2014 0133   HGBUR NEGATIVE 06/12/2021 0342   BILIRUBINUR NEGATIVE 06/12/2021 0342   BILIRUBINUR Negative 12/06/2014 0133   KETONESUR NEGATIVE 06/12/2021 0342   PROTEINUR 100 (A) 06/12/2021 0342   NITRITE NEGATIVE 06/12/2021 0342   LEUKOCYTESUR NEGATIVE 06/12/2021 0342   LEUKOCYTESUR Negative 12/06/2014 0133    Radiological Exams on Admission: No results found.  EKG: Independently reviewed. NSR,. T wave inversion AvL  Assessment/Plan Active Problems:   Hypoglycemia   Headache   Abdominal pain   Diabetes (HCC)   Dehydration   HTN (hypertension)   Hypoglycemia/diabetes - patient has been resuscitated in ED. She reports that this has happened before for unknown reasons. She is on multiple products including novolog, victoza and trisheba.  Plan SS coverage  Hold Victoza  2. Dehydration - patient with elevated BUN/Cr acutely. Has had poor PO intake Plan IVF w/ 1/2NS  F/u Bmet  3. HTN- stable - continue home meds  4. Abdominal pain - very tender on exam. No masses. Nl LFTs Plan Lipase  CT abd/pelvis  Addendum: After admission at about 1: 18 patient had an acute change in mental status.She was unresponsive .  Repeat Exam: Patient only ressponsive  to hard sternal rub Pupils equal but not reactive, no response to controntation.  Had a rightward gaze. Right UE flaccid.  Code stroke called.  CT head- negatvie MRI brain w/o acute event MRA w/o LVO  Dr. Selina Cooley saw patient.Her diagnostic concern was seizure. Patient Loaded with Keppra. Transfer to [facility] for neuro monitoring, continuous EEG recommended.  Transfer initiated.    DVT prophylaxis: lovenox  Code Status: full code  Family Communication: attempted to call Robt Day - spouse, no answer  Disposition Plan: home when stable  Consults called: none  Admission status: obs     Illene Regulus MD [facility] Pager [phone]  If 7PM-7AM, please contact night-coverage [email or web address] Password [email or web address]  06/12/2021, 8:52 AM

## 2021-06-12 NOTE — ED Notes (Signed)
Pt bladder scanned for .

## 2021-06-12 NOTE — ED Notes (Addendum)
Multiple attempts made for iv access and ej access by md and rns wihtout success. Emergency placement of right groin triple lumen central line initiated by dr. Lenard Lance. Attempted to get pt to drink juice, pt is too confused to follow commands, repeat fsbs 65on meter.

## 2021-06-12 NOTE — Consult Note (Addendum)
NEUROLOGY CONSULTATION NOTE   Date of service: June 12, 2021 Patient Name: [name] Gilbert MRN:  [id number] DOB:  April 13, 1961 Reason for consult: stroke code Requesting physician: Dr. Illene Regulus _ _ _   _ __   _ __ _ _  __ __   _ __   __ _  History of Present Illness   This is a 60 year old woman with past medical history of diabetes type 2, hypertension, remote strokes in the left PCA region as well as elements in the right occipital and right parietal lobes, and visual impairment who was found unresponsive by EMS with a blood sugar of 18.  After administration of D50 patient became more responsive.  She complained of a headache and Fioricet was given without relief.  She has poor vascular access and had to have a central femoral line placed.  She was started on IVF and ketorolac.  She was alert and interactive until LKW 1:10 PM when nurse noted that she became significantly less responsive, had acute on chronic halting speech defect, right-sided hemiplegia, and gaze deviation to the right.  Stroke code was activated.  Head CT showed no acute intracranial process.  Direction of gaze deviation was consistent consistent with seizure rather than stroke particularly in the context of recent hypoglycemia therefore TNKase was held pending further evaluation with stroke code MRI.  Patient did receive 2 mg of IV Ativan and CT scan which did not improve her exam but only made her more lethargic.  There is no evidence of acute infarct on limited MRI study.  MRA head and neck did not show any LVO.  Based on the MRI and MRA results there is no indication to give TNKase or to proceed with neuro intervention.  MRI brain MRA H&N IMPRESSION: 1. Severely motion degraded head MRI without an acute intracranial abnormality identified. 2. Chronic ischemia with multiple old infarcts as above. 3. No emergent large vessel occlusion. 4. Severe left P2 PCA stenoses with severely decreased flow/occlusion of more  distal left PCA branch vessels corresponding to a chronic infarct. 5. Severe distal M1 and proximal M2 stenoses bilaterally. 6. Mild-to-moderate right A1 origin stenosis. 7. Severely limited neck MRA. Gross patency of the included portions of the cervical carotid and vertebral arteries.    CNS imaging personally reviewed I agree with above interpretation.   ROS   Per HPI: all other systems reviewed and are negative  Past History   I have reviewed the following:  Past Medical History:  Diagnosis Date   Diabetes mellitus without complication (HCC)    Hypertension    Stroke (HCC)    visual impaired    Past Surgical History:  Procedure Laterality Date   ABDOMINAL HYSTERECTOMY     TONSILLECTOMY     Family History  Problem Relation Age of Onset   Diabetes Father    Social History   Socioeconomic History   Marital status: Married    Spouse name: Not on file   Number of children: Not on file   Years of education: Not on file   Highest education level: Not on file  Occupational History   Occupation: home maker  Tobacco Use   Smoking status: Never   Smokeless tobacco: Never  Substance and Sexual Activity   Alcohol use: No   Drug use: No   Sexual activity: Not on file  Other Topics Concern   Not on file  Social History Narrative   Not on file   Social Determinants of Health  Financial Resource Strain: Not on file  Food Insecurity: Not on file  Transportation Needs: Not on file  Physical Activity: Not on file  Stress: Not on file  Social Connections: Not on file   Allergies  Allergen Reactions   Metformin Other (See Comments)    Reaction: unknown   Penicillins Hives, Nausea And Vomiting and Swelling    Has patient had a PCN reaction causing immediate rash, facial/tongue/throat swelling, SOB or lightheadedness with hypotension: Yes Has patient had a PCN reaction causing severe rash involving mucus membranes or skin necrosis: No Has patient had a PCN  reaction that required hospitalization Yes Has patient had a PCN reaction occurring within the last 10 years: No If all of the above answers are "NO", then may proceed with Cephalosporin use.    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    Medications    Current Facility-Administered Medications:    [EXPIRED] 0.45 % sodium chloride infusion, , Intravenous, Continuous, Last Rate: 150 mL/hr at 06/12/21 0821, New Bag at 06/12/21 0821 **FOLLOWED BY** 0.45 % sodium chloride infusion, , Intravenous, Continuous, Norins, Rosalyn Gess, MD, Last Rate: 75 mL/hr at 06/12/21 1451, New Bag at 06/12/21 1451   acetaminophen (TYLENOL) tablet 650 mg, 650 mg, Oral, Q6H PRN **OR** acetaminophen (TYLENOL) suppository 650 mg, 650 mg, Rectal, Q6H PRN, Norins, Rosalyn Gess, MD   acetaminophen (TYLENOL) tablet 1,000 mg, 1,000 mg, Oral, TID PRN, Norins, Rosalyn Gess, MD   amLODipine (NORVASC) tablet 10 mg, 10 mg, Oral, Daily, Norins, Rosalyn Gess, MD, 10 mg at 06/12/21 1211   aspirin chewable tablet 81 mg, 81 mg, Oral, q morning, Norins, Rosalyn Gess, MD, 81 mg at 06/12/21 1212   carvedilol (COREG) tablet 25 mg, 25 mg, Oral, BID, Norins, Rosalyn Gess, MD, 25 mg at 06/12/21 1212   cloNIDine (CATAPRES) tablet 0.2 mg, 0.2 mg, Oral, BID, Norins, Rosalyn Gess, MD, 0.2 mg at 06/12/21 1211   enoxaparin (LOVENOX) injection 40 mg, 40 mg, Subcutaneous, Q24H, Norins, Rosalyn Gess, MD   hydrALAZINE (APRESOLINE) tablet 25 mg, 25 mg, Oral, TID, Norins, Rosalyn Gess, MD   hydrochlorothiazide (HYDRODIURIL) tablet 50 mg, 50 mg, Oral, Daily, Norins, Rosalyn Gess, MD, 50 mg at 06/12/21 1210   insulin aspart (novoLOG) injection 0-15 Units, 0-15 Units, Subcutaneous, TID WC, Norins, Rosalyn Gess, MD   insulin aspart (novoLOG) injection 2 Units, 2 Units, Subcutaneous, TID WC, Norins, Rosalyn Gess, MD   insulin glargine-yfgn (SEMGLEE) injection 10 Units, 10 Units, Subcutaneous,  QHS, Norins, Rosalyn Gess, MD   ketorolac (TORADOL) 30 MG/ML injection 30 mg, 30 mg, Intravenous, Q6H PRN, Norins, Rosalyn Gess, MD   levETIRAcetam (KEPPRA) 1,630 mg in sodium chloride 0.9 % 100 mL IVPB, 20 mg/kg, Intravenous, Once, Jefferson Fuel, MD   pregabalin (LYRICA) capsule 75 mg, 75 mg, Oral, BID, Norins, Rosalyn Gess, MD, 75 mg at 06/12/21 1212   rosuvastatin (CRESTOR) tablet 20 mg, 20 mg, Oral, QHS, Norins, Rosalyn Gess, MD   [START ON 06/13/2021] spironolactone (ALDACTONE) tablet 150 mg, 150 mg, Oral, Daily, Norins, Rosalyn Gess, MD  Current Outpatient Medications:    acetaminophen (TYLENOL) 500 MG tablet, Take 1,000 mg by mouth daily as needed for pain., Disp: , Rfl:    amLODipine (NORVASC) 10 MG tablet, Take 10 mg by mouth daily., Disp: , Rfl:    aspirin 81 MG chewable tablet, Chew 81 mg  by mouth every morning., Disp: , Rfl:    carvedilol (COREG) 12.5 MG tablet, Take 25 mg by mouth 2 (two) times daily., Disp: , Rfl:    cloNIDine (CATAPRES) 0.1 MG tablet, Take 0.2 mg by mouth 2 (two) times daily., Disp: , Rfl:    diclofenac Sodium (VOLTAREN) 1 % GEL, Apply 2 g topically in the morning, at noon, in the evening, and at bedtime., Disp: , Rfl:    hydrALAZINE (APRESOLINE) 25 MG tablet, Take 25 mg by mouth 3 (three) times daily., Disp: , Rfl:    hydrochlorothiazide (HYDRODIURIL) 50 MG tablet, Take 50 mg by mouth daily., Disp: , Rfl:    insulin aspart (NOVOLOG FLEXPEN) 100 UNIT/ML FlexPen, Inject 2 Units into the skin 3 (three) times daily with meals., Disp: 15 mL, Rfl: 11   insulin degludec (TRESIBA FLEXTOUCH) 100 UNIT/ML SOPN FlexTouch Pen, Inject 30 Units into the skin daily., Disp: , Rfl:    insulin lispro (HUMALOG) 100 UNIT/ML injection, Inject 0-30 Units into the skin daily., Disp: , Rfl:    lisinopril (ZESTRIL) 40 MG tablet, Take 40 mg by mouth daily., Disp: , Rfl:    pregabalin (LYRICA) 75 MG capsule, Take 75 mg by mouth 2 (two) times daily., Disp: , Rfl:    rosuvastatin (CRESTOR) 20 MG tablet,  Take 20 mg by mouth at bedtime., Disp: , Rfl:    spironolactone (ALDACTONE) 50 MG tablet, Take 150 mg by mouth daily., Disp: , Rfl:   Vitals   Vitals:   06/12/21 0600 06/12/21 0839 06/12/21 1200 06/12/21 1211  BP: 137/69  (!) 160/99 (!) 167/90  Pulse: 63  69   Resp:   16   Temp: (!) 96.5 F (35.8 C)     TempSrc:      SpO2: 100%  100%   Weight:  81.6 kg    Height:  5\' 2"  (1.575 m)       Body mass index is 32.92 kg/m.  Physical Exam   Physical Exam Gen: drowsy, oriented to first name only HEENT: Atraumatic, normocephalic;mucous membranes moist; oropharynx clear, tongue without atrophy or fasciculations. Neck: Supple, trachea midline. Resp: CTAB, no w/r/r CV: RRR, no m/g/r; nml S1 and S2. 2+ symmetric peripheral pulses. Abd: soft/NT/ND; nabs x 4 quad Extrem: Nml bulk; no cyanosis, clubbing, or edema.  Neuro: *MS: drowsy, oriented to first name only, unable to follow most simple commands *Speech: scanning, halting speech, moderate dysarthria, unable to attend to test naming and repetition *CN: PERRL, blinks to threat bilat, R gaze preference cannot cross midline, sensation intact, face symmetric at rest, hearing intact to voice *Motor: LUE slight drift, RUE some effort against gravity but falls to bed, wiggles toes only BLE *Sensory: SILT *Reflexes: 1+ symmetric throughout, toes mute bilat *Coordination, gait: UTA   NIHSS  1a Level of Conscious.: 1 1b LOC Questions: 2 1c LOC Commands: 2 2 Best Gaze: 1 3 Visual: 0 4 Facial Palsy: 0 5a Motor Arm - left: 1 5b Motor Arm - Right: 2 6a Motor Leg - Left: 3 6b Motor Leg - Right: 3 7 Limb Ataxia: 0 8 Sensory: 0 9 Best Language: 1 10 Dysarthria: 1 11 Extinct. and Inatten.: 0  TOTAL: 17   Premorbid mRS = 1   Labs   CBC:  Recent Labs  Lab 06/12/21 0342  WBC 7.6  HGB 10.9*  HCT 33.2*  MCV 88.5  PLT 233    Basic Metabolic Panel:  Lab Results  Component Value Date   NA 137  06/12/2021   K 4.0 06/12/2021    CO2 27 06/12/2021   GLUCOSE 262 (H) 06/12/2021   BUN 37 (H) 06/12/2021   CREATININE 1.69 (H) 06/12/2021   CALCIUM 8.2 (L) 06/12/2021   GFRNONAA 34 (L) 06/12/2021   GFRAA >60 10/05/2019   Lipid Panel: No results found for: LDLCALC HgbA1c:  Lab Results  Component Value Date   HGBA1C 13.6 (H) 10/02/2019   Urine Drug Screen:     Component Value Date/Time   LABOPIA NONE DETECTED 10/01/2019 2038   COCAINSCRNUR NONE DETECTED 10/01/2019 2038   LABBENZ NONE DETECTED 10/01/2019 2038   AMPHETMU NONE DETECTED 10/01/2019 2038   THCU NONE DETECTED 10/01/2019 2038   LABBARB NONE DETECTED 10/01/2019 2038    Alcohol Level     Component Value Date/Time   ETH <10 10/28/2017 0302     Impression   60 year old woman with past medical history of diabetes type 2, hypertension, remote strokes in the left PCA region as well as elements in the right occipital and right parietal lobes, and visual impairment who was found unresponsive by EMS with a blood sugar of 18.  After administration of D50 she was alert and interactive in ED with only a baseline halting speech defect from prior stroke and no other deficits. At 1310 while RN was in her room pt developed decreased responsiveness, aphasia, R sided weakness, and R gaze deviation. This constellation of symptoms with gaze deviation to the right is suggestive of seizure (would deviate to opposite side in stroke) therefore TNK was held pending MRI evaluation which was negative for acute ischemia. Therefore TNK was not administered. MRA was neg for LVO therefore no indication for intervention. Suspect etiology is seizure. Patient did not improve after administration of IV ativan. Keppra load 20mg /kg ordered to be f/b 500mg  bid. If she does not return to baseline shortly she should be transferred to [facility] for continuous EEG to rule out nonconvulsive seizure activity (we do not have that capability at [facility]).   Recommendations   - S/p 2mg  IV ativan w/o  improvement - S/p keppra load 20mg /kg to be f/b 500mg  bid - Workup for other medical causes of unresponsiveness per primary team - No further stroke w/u indicated - If patient does not return to baseline shortly or if she develops fluctuating mental status or further episodes of unresponsiveness she should be transferred to [facility] for continuous EEG to rule out nonconvulsive seizure activity (we do not have that capability at [facility]). She should be transferred to hospitalist service (floor bed if vitals stable, no need to be on neuro unit) and page [facility] neurohospitalist on arrival. - Continue ASA 81mg  daily for secondary stroke prevention  D/w Dr. her [facility] neurohospitalist who will request transfer to [facility]. I will make the [facility] neurohospitalist team aware that she is coming. In the event that she is still awaiting transfer by tmrw AM my colleague Dr. [facility] will see her at [facility].  This patient is critically ill and at significant risk of neurological worsening, death and care requires constant monitoring of vital signs, hemodynamics,respiratory and cardiac monitoring, neurological assessment, discussion with family, other specialists and medical decision making of high complexity. I spent 90 minutes of neurocritical care time  in the care of  this patient. This was time spent independent of any time provided by nurse practitioner or PA.  Debby Bud, MD [facility] [phone]  If 7pm- 7am, please page neurology on call as listed in AMION.

## 2021-06-12 NOTE — ED Provider Notes (Addendum)
[facility] Emergency Department Provider Note  Time seen: 3:36 AM  I have reviewed the triage vital signs and the nursing notes.   HISTORY  Chief Complaint Hypoglycemia    HPI [name] Gilbert is a 60 y.o. female with a past medical history of diabetes, hypertension, prior CVA, presents to the emergency department for unresponsiveness.  According to EMS they were called out for unresponsiveness, found the patient's blood sugar to be 30.  Attempted IV multiple times without success and brought the patient to the emergency department.  Upon arrival patient's blood glucose of 18 in the emergency department.  Unable to obtain IV access despite multiple attempts including ultrasound-guided attempts.      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 Authorizing Provider  amLODipine (NORVASC) 10 MG tablet Take 10 mg by mouth daily. 09/28/19   [provider]  atorvastatin (LIPITOR) 40 MG tablet Take 40 mg by mouth daily.    [provider]  DULoxetine (CYMBALTA) 20 MG capsule Take 60 mg by mouth daily. 04/21/19   [provider]  insulin aspart (NOVOLOG FLEXPEN) 100 UNIT/ML FlexPen Inject 2 Units into the skin 3 (three)  times daily with meals. 11/01/18   Alford Highland, MD  insulin degludec (TRESIBA FLEXTOUCH) 100 UNIT/ML SOPN FlexTouch Pen Inject 30 Units into the skin daily. 08/11/19   [provider]  liraglutide (VICTOZA) 18 MG/3ML SOPN Inject 0.6 mg into the skin daily. 02/05/19   [provider]    Allergies  Allergen Reactions   Penicillins Hives, Nausea And Vomiting and Swelling    Has patient had a PCN reaction causing immediate rash, facial/tongue/throat swelling, SOB or lightheadedness with hypotension: Yes Has patient had a PCN reaction causing severe rash involving mucus membranes or skin necrosis: No Has patient had a PCN reaction that required hospitalization Yes Has patient had a PCN reaction occurring within the last 10 years: No If all of the above answers are "NO", then may proceed with Cephalosporin use.    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  ____________________________________________   PHYSICAL EXAM:  VITAL SIGNS: ED Triage Vitals [06/12/21 0327]  Enc Vitals Group     BP (!) 152/132  Pulse Rate (!) 58     Resp 12     Temp      Temp Source Rectal     SpO2      Weight      Height      Head Circumference      Peak Flow      Pain Score      Pain Loc      Pain Edu?      Excl. in GC?    Constitutional: Patient is unresponsive occasionally will moan or state "I am cold."  Does not answer questions or follow commands. Eyes: 2 to 3 mm reactive although somewhat sluggishly. ENT      Head:  Normocephalic and atraumatic.      Mouth/Throat: Dry appearing mucous membranes Cardiovascular: Normal rate, regular rhythm. Respiratory: Normal respiratory effort without tachypnea nor retractions. Breath sounds are clear  Gastrointestinal: Soft and nontender. No distention.  Musculoskeletal: Nontender with normal range of motion in all extremities. Neurologic: Decreased responsiveness, not answering questions or following commands will occasionally moan or say "I am cold." Skin:  Skin is cool to the touch. Psychiatric: Confused  ____________________________________________    EKG  EKG viewed and interpreted by myself shows a normal sinus rhythm at 61 bpm with a narrow QRS, normal axis, normal intervals, nonspecific ST changes.  ____________________________________________   INITIAL IMPRESSION / ASSESSMENT AND PLAN / ED COURSE  Pertinent labs & imaging results that were available during my care of the patient were reviewed by me and considered in my medical decision making (see chart for details).   Patient presents to the emergency department via EMS for unresponsiveness.  Patient's initial blood glucose of 30 with EMS, 18 upon arrival.  Multiple IV attempts without success.  Attempted ultrasound-guided IV without success.  Patient has extremely limited and small veins.  Given the emergent situation unable to obtain IV access I placed a right femoral triple-lumen catheter.  We were able to give D50 immediately through the catheter after confirming placement via pullback and flushing.  Within several seconds patient has become more responsive although still confused, she is now talking and answering questions.  93.8 degrees rectally, placed on a Lawyer.  CENTRAL LINE Performed by: Minna Antis Consent: The procedure was performed in an emergent situation. Required items: required blood products, implants, devices, and special equipment available Patient identity confirmed:  arm band and provided demographic data Time out: Immediately prior to procedure a "time out" was called to verify the correct patient, procedure, equipment, support staff and site/side marked as required. Indications: vascular access Anesthesia: local infiltration Local anesthetic: lidocaine 1% with epinephrine Anesthetic total: 3 ml Patient sedated: no Preparation: skin prepped with 2% chlorhexidine Skin prep agent dried: skin prep agent completely dried prior to procedure Hand hygiene: hand hygiene performed prior to central venous catheter insertion  Location details: Right femoral  Catheter type: triple lumen Catheter size: 8 Fr Pre-procedure: landmarks identified Ultrasound guidance: Yes Successful placement: yes Post-procedure: line sutured and dressing applied Assessment: blood return through all parts, free fluid flow Patient tolerance: Patient tolerated the procedure well with no immediate complications.   Alysha Doolan was evaluated in Emergency Department on 06/12/2021 for the symptoms described in the history of present illness. She was evaluated in the context of the global COVID-19 pandemic, which necessitated consideration that the patient might be at risk for infection with the SARS-CoV-2 virus that causes COVID-19. Institutional protocols and algorithms that pertain to the evaluation of  patients at risk for COVID-19 are in a state of rapid change based on information released by regulatory bodies including the CDC and federal and state organizations. These policies and algorithms were followed during the patient's care in the ED.  CRITICAL CARE Performed by: Minna Antis   Total critical care time: 30 minutes  Critical care time was exclusive of separately billable procedures and treating other patients.  Critical care was necessary to treat or prevent imminent or life-threatening deterioration.  Critical care was time spent personally by me on the following  activities: development of treatment plan with patient and/or surrogate as well as nursing, discussions with consultants, evaluation of patient's response to treatment, examination of patient, obtaining history from patient or surrogate, ordering and performing treatments and interventions, ordering and review of laboratory studies, ordering and review of radiographic studies, pulse oximetry and re-evaluation of patient's condition.  ____________________________________________   FINAL CLINICAL IMPRESSION(S) / ED DIAGNOSES  Hypoglycemia   Minna Antis, MD 06/12/21 0998    Minna Antis, MD 06/12/21 [phone]

## 2021-06-12 NOTE — ED Notes (Addendum)
This RN was arriving with meds for patient when I witnessed pt sitting on floor at the foot of the bed. Pt had been hooked to monitor and checked in on moments before hand. Pt appeared confused and was unsure why she was out of bed. Pt was placed back in bed with assist of 2. Pt was assessed and is endorsing ankle pain with ROM with no pain. MD alerted. Pt resting in bed. Yellow socks placed on patient, bed alarm turned on and patient instructed to call for help before attempting to ambulate.

## 2021-06-12 NOTE — ED Notes (Signed)
Patient to be transferred to [facility]  per Dr. Ranell Patrick

## 2021-06-12 NOTE — ED Notes (Signed)
Called spoke to [facility] patient waiting for bed assignment [phone]

## 2021-06-12 NOTE — ED Notes (Signed)
Pt resting in bed, Drowsy but arousable. Pt unable to move RUE, but does have sensation. Pt R pupil sluggish but patient endorses vision at this time. Assessment was limited due to patients drowsiness. Pt was able to ask questions and respond at times.

## 2021-06-12 NOTE — ED Notes (Signed)
Pt with decreased LOC from baseline. Pt

## 2021-06-12 NOTE — ED Notes (Signed)
Crackers given to pt per pt request.

## 2021-06-12 NOTE — ED Notes (Signed)
Called [facility] spoke to [place] waiting for bed assignment

## 2021-06-12 NOTE — ED Notes (Signed)
Purewick placed for patient comfort.

## 2021-06-12 NOTE — ED Triage Notes (Signed)
EMS called out for unresponsive. Initial BG was in the 30s. Pt is altered but responsive to pain. Attempting IV access at this time.

## 2021-06-12 NOTE — Progress Notes (Signed)
Cross coverage  Pt had unwitnessed fall from bed, and that she was noted to be sitting on the floor when nurse walked into the room.  He was slightly confused.  No external signs of injury.  Was helped back into bed.  We will continue to monitor.  Fall precautions initiated.  Blood sugar testing ordered

## 2021-06-12 NOTE — ED Notes (Signed)
No TNK/TNP at this time per neuro. Pt responsive to voice at this time. IV Ativan given in CT scan due to possible suspected seizure.

## 2021-06-12 NOTE — ED Notes (Signed)
Bair hugger applied at highest temperature.

## 2021-06-13 ENCOUNTER — Inpatient Hospital Stay
Admit: 2021-06-13 | Discharge: 2021-06-13 | Disposition: A | Discharge status: Home / Self Care | Payer: Medicare HMO | Attending: Internal Medicine | Admitting: Internal Medicine

## 2021-06-13 DIAGNOSIS — R29818 Other symptoms and signs involving the nervous system: Secondary | ICD-10-CM

## 2021-06-13 DIAGNOSIS — N1831 Chronic kidney disease, stage 3a: Secondary | ICD-10-CM | POA: Diagnosis present

## 2021-06-13 DIAGNOSIS — E860 Dehydration: Secondary | ICD-10-CM | POA: Diagnosis not present

## 2021-06-13 DIAGNOSIS — I69354 Hemiplegia and hemiparesis following cerebral infarction affecting left non-dominant side: Secondary | ICD-10-CM | POA: Diagnosis not present

## 2021-06-13 DIAGNOSIS — Z880 Allergy status to penicillin: Secondary | ICD-10-CM | POA: Diagnosis not present

## 2021-06-13 DIAGNOSIS — R569 Unspecified convulsions: Secondary | ICD-10-CM | POA: Diagnosis present

## 2021-06-13 DIAGNOSIS — R680 Hypothermia, not associated with low environmental temperature: Secondary | ICD-10-CM | POA: Diagnosis present

## 2021-06-13 DIAGNOSIS — E11649 Type 2 diabetes mellitus with hypoglycemia without coma: Secondary | ICD-10-CM | POA: Diagnosis present

## 2021-06-13 DIAGNOSIS — N179 Acute kidney failure, unspecified: Secondary | ICD-10-CM | POA: Diagnosis present

## 2021-06-13 DIAGNOSIS — Z885 Allergy status to narcotic agent status: Secondary | ICD-10-CM | POA: Diagnosis not present

## 2021-06-13 DIAGNOSIS — Z79899 Other long term (current) drug therapy: Secondary | ICD-10-CM | POA: Diagnosis not present

## 2021-06-13 DIAGNOSIS — Z794 Long term (current) use of insulin: Secondary | ICD-10-CM | POA: Diagnosis not present

## 2021-06-13 DIAGNOSIS — Z833 Family history of diabetes mellitus: Secondary | ICD-10-CM | POA: Diagnosis not present

## 2021-06-13 DIAGNOSIS — Z888 Allergy status to other drugs, medicaments and biological substances status: Secondary | ICD-10-CM | POA: Diagnosis not present

## 2021-06-13 DIAGNOSIS — E1122 Type 2 diabetes mellitus with diabetic chronic kidney disease: Secondary | ICD-10-CM | POA: Diagnosis present

## 2021-06-13 DIAGNOSIS — T68XXXA Hypothermia, initial encounter: Secondary | ICD-10-CM | POA: Diagnosis present

## 2021-06-13 DIAGNOSIS — E871 Hypo-osmolality and hyponatremia: Secondary | ICD-10-CM | POA: Diagnosis present

## 2021-06-13 DIAGNOSIS — E162 Hypoglycemia, unspecified: Secondary | ICD-10-CM | POA: Diagnosis present

## 2021-06-13 DIAGNOSIS — Z6832 Body mass index (BMI) 32.0-32.9, adult: Secondary | ICD-10-CM | POA: Diagnosis not present

## 2021-06-13 DIAGNOSIS — I10 Essential (primary) hypertension: Secondary | ICD-10-CM | POA: Diagnosis not present

## 2021-06-13 DIAGNOSIS — E663 Overweight: Secondary | ICD-10-CM | POA: Diagnosis present

## 2021-06-13 DIAGNOSIS — I129 Hypertensive chronic kidney disease with stage 1 through stage 4 chronic kidney disease, or unspecified chronic kidney disease: Secondary | ICD-10-CM | POA: Diagnosis present

## 2021-06-13 DIAGNOSIS — Z20822 Contact with and (suspected) exposure to covid-19: Secondary | ICD-10-CM | POA: Diagnosis present

## 2021-06-13 DIAGNOSIS — R109 Unspecified abdominal pain: Secondary | ICD-10-CM | POA: Diagnosis present

## 2021-06-13 LAB — BASIC METABOLIC PANEL
Anion gap: 5 (ref 5–15)
BUN: 33 mg/dL — ABNORMAL HIGH (ref 8–23)
CO2: 23 mmol/L (ref 22–32)
Calcium: 7.6 mg/dL — ABNORMAL LOW (ref 8.9–10.3)
Chloride: 105 mmol/L (ref 98–111)
Creatinine, Ser: 1.8 mg/dL — ABNORMAL HIGH (ref 0.44–1.00)
GFR, Estimated: 31 mL/min — ABNORMAL LOW (ref >60)
Glucose, Bld: 163 mg/dL — ABNORMAL HIGH (ref 70–99)
Potassium: 4.1 mmol/L (ref 3.5–5.1)
Sodium: 133 mmol/L — ABNORMAL LOW (ref 135–145)

## 2021-06-13 LAB — ECHOCARDIOGRAM COMPLETE
AR max vel: 2.25 cm2
AV Area VTI: 2.51 cm2
AV Area mean vel: 2.51 cm2
AV Mean grad: 2 mmHg
AV Peak grad: 3.1 mmHg
Ao pk vel: 0.88 m/s
Area-P 1/2: 3.95 cm2
Height: 62 in
S' Lateral: 1.84 cm
Weight: 2880 oz

## 2021-06-13 LAB — CBG MONITORING, ED
Glucose-Capillary: 158 mg/dL — ABNORMAL HIGH (ref 70–99)
Glucose-Capillary: 160 mg/dL — ABNORMAL HIGH (ref 70–99)
Glucose-Capillary: 93 mg/dL (ref 70–99)
Glucose-Capillary: 109 mg/dL — ABNORMAL HIGH (ref 70–99)

## 2021-06-13 LAB — HEMOGLOBIN A1C
Hgb A1c MFr Bld: 11.6 % — ABNORMAL HIGH (ref 4.8–5.6)
Mean Plasma Glucose: 286 mg/dL

## 2021-06-13 MED ORDER — INSULIN GLARGINE-YFGN 100 UNIT/ML ~~LOC~~ SOLN
20.0000 [IU] | Freq: Every day | SUBCUTANEOUS | Status: DC
  Filled 2021-06-13: qty 0.2

## 2021-06-13 MED ORDER — SODIUM CHLORIDE 0.9 % IV SOLN: INTRAVENOUS | Status: DC

## 2021-06-13 MED ORDER — SODIUM CHLORIDE 0.9 % IV BOLUS
250.0000 mL | Freq: Once | INTRAVENOUS | Status: AC
  Administered 2021-06-13: 250 mL via INTRAVENOUS

## 2021-06-13 MED ORDER — INSULIN GLARGINE-YFGN 100 UNIT/ML ~~LOC~~ SOLN
26.0000 [IU] | Freq: Every day | SUBCUTANEOUS | Status: DC
  Filled 2021-06-13 (×2): qty 0.26

## 2021-06-13 NOTE — ED Notes (Addendum)
Unable to verify that stroke swallow screen was completed yesterday when it was ordered, 06/12/21 @ 1300. Pt was eating and drinking at shift change (0700) this am without choking and/or coughing and no s/s of aspiration.

## 2021-06-13 NOTE — Progress Notes (Signed)
Eeg done 

## 2021-06-13 NOTE — ED Notes (Addendum)
Pt with hypotension, 77/50(60). MD notified. 250cc bolus given. Bp improved. See flowsheet.

## 2021-06-13 NOTE — ED Notes (Signed)
Pt given applesauce, graham crackers, and ginger ale this time. Pt needs minimal assistance. Pt still lethargic at this time.

## 2021-06-13 NOTE — ED Notes (Signed)
Called Patient placement for update on bed assignment, nothing available at this time.

## 2021-06-13 NOTE — ED Notes (Signed)
Pt yelling to nurses station, pt stating she does not want to be here and she wants to call her husband to come pick her up. Explained that she has been in the hospital the past day and that she has been confused but that she is getting treated for her hypoglycemia and possible seizures. Pt is confused and disoriented x4. NAD

## 2021-06-13 NOTE — ED Notes (Signed)
Pt noted to be off the monitor. This RN and Zollie Scale, RN at bedside. Pt states she needs clothes and needs to get out of here because she cannot lay here like this. Pt states she wants to go home. Pt taken out of her personal clothes and into hospital gown. Pt states she feels wet. This RN checked brief cleaned and dry. Pt pad positioned in the right place and pt is repositioned in the bed.

## 2021-06-13 NOTE — ED Notes (Signed)
Pt requesting to go home - attending notified by secure chat.

## 2021-06-13 NOTE — Progress Notes (Signed)
Neurology Progress Note   S:// Seen and examined. No overnight seizures or concerning events. Comfortably laying in bed.   O:// Current vital signs: BP 96/83   Pulse 70   Temp (!) 96.5 F (35.8 C)   Resp 13   Ht 5\' 2"  (1.575 m)   Wt 81.6 kg   SpO2 100%   BMI 32.92 kg/m  Vital signs in last 24 hours: Pulse Rate:  [46-70] 70 (10/03 0600) Resp:  [8-20] 13 (10/03 0600) BP: (74-167)/(46-99) 96/83 (10/03 0600) SpO2:  [98 %-100 %] 100 % (10/03 0600) Weight:  [81.6 kg] 81.6 kg (10/02 0839) GENERAL: Awake, alert in NAD HEENT: - Normocephalic and atraumatic, dry mm, no LN++, no Thyromegally LUNGS - Clear to auscultation bilaterally with no wheezes CV - S1S2 RRR, no m/r/g, equal pulses bilaterally. ABDOMEN - Soft, nontender, nondistended with normoactive BS Ext: warm, well perfused, intact peripheral pulses, no edema NEURO:  Mental Status: AA and oriented to self, age (1 yr when she is 25), could not tell me month, told me she is in the hospital. Also said wants to go home. Attention concentration is reduced. Language: speech is clear.  Naming, repetition, fluency, and comprehension intact. Cranial Nerves: PERRL  EOMI, visual fields - blinks consistently to threat bilaterally but has difficulty with fixating gaze in all directions, no facial asymmetry, facial sensation intact, hearing intact, tongue/uvula/soft palate midline, normal sternocleidomastoid and trapezius muscle strength. No evidence of tongue atrophy or fibrillations Motor: baseline left hemiparesis - LUE 4/5, LLE barely 3/5 with coaching. RUE and RLE 4+/5 Tone: increased mildly on left. Normal on right Sensation- decreased to LT on the left Coordination: no dysmetria Gait- deferred   Medications  Current Facility-Administered Medications:    [EXPIRED] 0.45 % sodium chloride infusion, , Intravenous, Continuous, Stopped at 06/12/21 1500 **FOLLOWED BY** 0.45 % sodium chloride infusion, , Intravenous, Continuous,  Norins, 08/12/21, MD, Last Rate: 75 mL/hr at 06/13/21 0346, Rate Verify at 06/13/21 0346   acetaminophen (TYLENOL) tablet 650 mg, 650 mg, Oral, Q6H PRN **OR** acetaminophen (TYLENOL) suppository 650 mg, 650 mg, Rectal, Q6H PRN, Norins, 08/13/21, MD   acetaminophen (TYLENOL) tablet 1,000 mg, 1,000 mg, Oral, TID PRN, Norins, Rosalyn Gess, MD   amLODipine (NORVASC) tablet 10 mg, 10 mg, Oral, Daily, Norins, Rosalyn Gess, MD, 10 mg at 06/12/21 1211   aspirin chewable tablet 81 mg, 81 mg, Oral, q morning, Norins, 1212, MD, 81 mg at 06/12/21 1212   carvedilol (COREG) tablet 25 mg, 25 mg, Oral, BID, Norins, 08/12/21, MD, 25 mg at 06/12/21 1212   cloNIDine (CATAPRES) tablet 0.2 mg, 0.2 mg, Oral, BID, Norins, 1213, MD, 0.2 mg at 06/12/21 1211   enoxaparin (LOVENOX) injection 40 mg, 40 mg, Subcutaneous, Q24H, Norins, 08/12/21, MD, 40 mg at 06/12/21 2305   hydrALAZINE (APRESOLINE) tablet 25 mg, 25 mg, Oral, TID, Norins, 2306, MD   hydrochlorothiazide (HYDRODIURIL) tablet 50 mg, 50 mg, Oral, Daily, Norins, Rosalyn Gess, MD, 50 mg at 06/12/21 1210   insulin aspart (novoLOG) injection 0-15 Units, 0-15 Units, Subcutaneous, TID WC, Norins, 11-10-1986, MD   insulin aspart (novoLOG) injection 2 Units, 2 Units, Subcutaneous, TID WC, Norins, Rosalyn Gess, MD   insulin glargine-yfgn (SEMGLEE) injection 10 Units, 10 Units, Subcutaneous, QHS, Norins, Rosalyn Gess, MD   ketorolac (TORADOL) 30 MG/ML injection 30 mg, 30 mg, Intravenous, Q6H PRN, Norins, Rosalyn Gess, MD   levETIRAcetam (KEPPRA) IVPB 500 mg/100 mL premix, 500 mg, Intravenous, Q12H, Rosalyn Gess  M, MD, Stopped at 06/13/21 0345   pregabalin (LYRICA) capsule 75 mg, 75 mg, Oral, BID, Norins, Rosalyn Gess, MD, 75 mg at 06/12/21 1212   rosuvastatin (CRESTOR) tablet 20 mg, 20 mg, Oral, QHS, Norins, Rosalyn Gess, MD   spironolactone (ALDACTONE) tablet 150 mg, 150 mg, Oral, Daily, Norins, Rosalyn Gess, MD  Current Outpatient Medications:    acetaminophen (TYLENOL) 500 MG  tablet, Take 1,000 mg by mouth daily as needed for pain., Disp: , Rfl:    amLODipine (NORVASC) 10 MG tablet, Take 10 mg by mouth daily., Disp: , Rfl:    aspirin 81 MG chewable tablet, Chew 81 mg by mouth every morning., Disp: , Rfl:    carvedilol (COREG) 12.5 MG tablet, Take 25 mg by mouth 2 (two) times daily., Disp: , Rfl:    cloNIDine (CATAPRES) 0.1 MG tablet, Take 0.2 mg by mouth 2 (two) times daily., Disp: , Rfl:    diclofenac Sodium (VOLTAREN) 1 % GEL, Apply 2 g topically in the morning, at noon, in the evening, and at bedtime., Disp: , Rfl:    hydrALAZINE (APRESOLINE) 25 MG tablet, Take 25 mg by mouth 3 (three) times daily., Disp: , Rfl:    hydrochlorothiazide (HYDRODIURIL) 50 MG tablet, Take 50 mg by mouth daily., Disp: , Rfl:    insulin aspart (NOVOLOG FLEXPEN) 100 UNIT/ML FlexPen, Inject 2 Units into the skin 3 (three) times daily with meals., Disp: 15 mL, Rfl: 11   insulin degludec (TRESIBA FLEXTOUCH) 100 UNIT/ML SOPN FlexTouch Pen, Inject 30 Units into the skin daily., Disp: , Rfl:    insulin lispro (HUMALOG) 100 UNIT/ML injection, Inject 0-30 Units into the skin daily., Disp: , Rfl:    lisinopril (ZESTRIL) 40 MG tablet, Take 40 mg by mouth daily., Disp: , Rfl:    pregabalin (LYRICA) 75 MG capsule, Take 75 mg by mouth 2 (two) times daily., Disp: , Rfl:    rosuvastatin (CRESTOR) 20 MG tablet, Take 20 mg by mouth at bedtime., Disp: , Rfl:    spironolactone (ALDACTONE) 50 MG tablet, Take 150 mg by mouth daily., Disp: , Rfl:  Labs CBC    Component Value Date/Time   WBC 7.6 06/12/2021 0342   RBC 3.75 (L) 06/12/2021 0342   HGB 10.9 (L) 06/12/2021 0342   HGB 13.0 12/06/2014 0133   HCT 33.2 (L) 06/12/2021 0342   HCT 40.0 12/06/2014 0133   PLT 233 06/12/2021 0342   PLT 329 12/06/2014 0133   MCV 88.5 06/12/2021 0342   MCV 86 12/06/2014 0133   MCH 29.1 06/12/2021 0342   MCHC 32.8 06/12/2021 0342   RDW 13.7 06/12/2021 0342   RDW 14.8 (H) 12/06/2014 0133   LYMPHSABS 2.6 10/05/2019 0735    MONOABS 0.7 10/05/2019 0735   EOSABS 0.1 10/05/2019 0735   BASOSABS 0.0 10/05/2019 0735    CMP     Component Value Date/Time   NA 137 06/12/2021 0342   NA 134 (L) 12/07/2014 0606   K 4.0 06/12/2021 0342   K 4.1 12/07/2014 0606   CL 105 06/12/2021 0342   CL 101 12/07/2014 0606   CO2 27 06/12/2021 0342   CO2 27 12/07/2014 0606   GLUCOSE 262 (H) 06/12/2021 0342   GLUCOSE 265 (H) 12/07/2014 0606   BUN 37 (H) 06/12/2021 0342   BUN 21 (H) 12/07/2014 0606   CREATININE 1.69 (H) 06/12/2021 0342   CREATININE 1.36 (H) 12/07/2014 0606   CALCIUM 8.2 (L) 06/12/2021 0342   CALCIUM 8.3 (L) 12/07/2014 0606  PROT 7.1 06/12/2021 0342   PROT 8.1 12/06/2014 0133   ALBUMIN 3.0 (L) 06/12/2021 0342   ALBUMIN 3.8 12/06/2014 0133   AST 24 06/12/2021 0342   AST 36 12/06/2014 0133   ALT 14 06/12/2021 0342   ALT 17 12/06/2014 0133   ALKPHOS 91 06/12/2021 0342   ALKPHOS 93 12/06/2014 0133   BILITOT 0.6 06/12/2021 0342   BILITOT 0.3 12/06/2014 0133   GFRNONAA 34 (L) 06/12/2021 0342   GFRNONAA 44 (L) 12/07/2014 0606   GFRAA >60 10/05/2019 0735   GFRAA 51 (L) 12/07/2014 0606   UA neg for UTI   Imaging I have reviewed images in epic and the results pertinent to this consultation are: CT head, MRI brain and MRA head, MRA neck without any acute changes. Chronic left PCA and small chronic right occipital and parietal infarcts.  Formal read below  IMPRESSION: 1. Severely motion degraded head MRI without an acute intracranial abnormality identified. 2. Chronic ischemia with multiple old infarcts as above. 3. No emergent large vessel occlusion. 4. Severe left P2 PCA stenoses with severely decreased flow/occlusion of more distal left PCA branch vessels corresponding to a chronic infarct. 5. Severe distal M1 and proximal M2 stenoses bilaterally. 6. Mild-to-moderate right A1 origin stenosis. 7. Severely limited neck MRA. Gross patency of the included portions of the cervical carotid and  vertebral arteries.  Assessment: 60/F with multiple prior strokes with residual spastic left hemiparesis, HTN, DM2, came in as a code stroke for unresponsiveness. CBG 18 at that time. Gaze deviation to right with right sided weakness, concerning for seizure. Stroke eval emergently done - MRI negative for stroke. MRA negative for LVO.  Loaded with Keppra and started on Keppra standing dose. Clinically doing better today. Awake and alert, cooperative with exam. Mild left hemiparesis. No gaze preference or deviation noted today. Likely presentation due to seizure, possibly in the setting of profound hypoglycemia. Initial recs were to transfer to [facility] for cEEG if not back to baseline, but improved exam nearing baseline today - may do a spot EEG here at [facility] and not transfer patient to [facility]. Will keep on AED given prior strokes and possibly multiple risk factors that can lower seizure threshold.   Impression: -Seizure - ?provoked by hypoglycemia in the setting of prior strokes in multiple areas of the brain -Bilateral remote cortical infarcts -Toxic metabolic encephalopathy - multifactorial - post ictal+hypoglycemia+deranged renal function.  Recommendations: -Management of hypoglycemia and toxic metabolic derangements per primary team as you are. Renal function mildly more deranged today compared to yesterday on labs. -Will recommend continue Keppra 500 BID -Seizure precautions (detailed below) -Routine EEG - if not concerning, no further neurological work up needed. -Given significant improvement in exam, do not see an emergent need for cEEG and hence no need for emergent transfer to [facility]. -Stroke prevention - continue antiplatelet. No indication for DAPT as there is no acute stroke although has multifocal intracranial stenoses.  Preliminary plan relayed to primary hospitalist Dr. Butler Denmark via secure chat    -- Milon Dikes, MD Neurologist [facility] Pager:  [phone]  SEIZURE PRECAUTIONS Per [facility] statutes, patients with seizures are not allowed to drive until they have been seizure-free for six months.   Use caution when using heavy equipment or power tools. Avoid working on ladders or at heights. Take showers instead of baths. Ensure the water temperature is not too high on the home water heater. Do not go swimming alone. Do not lock yourself in  a room alone (i.e. bathroom). When caring for infants or small children, sit down when holding, feeding, or changing them to minimize risk of injury to the child in the event you have a seizure. Maintain good sleep hygiene. Avoid alcohol.    If patient has another seizure, call 911 and bring them back to the ED if: A.  The seizure lasts longer than 5 minutes.      B.  The patient doesn't wake shortly after the seizure or has new problems such as difficulty seeing, speaking or moving following the seizure C.  The patient was injured during the seizure D.  The patient has a temperature over 102 F (39C) E.  The patient vomited during the seizure and now is having trouble breathing

## 2021-06-13 NOTE — ED Notes (Signed)
Bed alarm remains in place. Curtains are open in the room.

## 2021-06-13 NOTE — Progress Notes (Addendum)
Inpatient Diabetes Program Recommendations  AACE/ADA: New Consensus Statement on Inpatient Glycemic Control (2015)  Target Ranges:  Prepandial:   less than 140 mg/dL      Peak postprandial:   less than 180 mg/dL (1-2 hours)      Critically ill patients:  140 - 180 mg/dL  Results for ADELIS, DOCTER (MRN [id number]) as of 06/13/2021 10:37  Ref. Range 06/12/2021 03:30 06/12/2021 03:54 06/12/2021 04:59 06/12/2021 06:36 06/12/2021 08:19 06/12/2021 12:00 06/12/2021 13:12 06/12/2021 18:45 06/12/2021 19:42  Glucose-Capillary Latest Ref Range: 70 - 99 mg/dL 65 (L) 175 (H) 149 (H) 142 (H) 107 (H) 70 106 (H) 61 (L) 118 (H)  Results for LETTI, TOWELL (MRN [id number]) as of 06/13/2021 10:37  Ref. Range 06/12/2021 23:17 06/13/2021 02:55 06/13/2021 07:55  Glucose-Capillary Latest Ref Range: 70 - 99 mg/dL 87 158 (H) 160 (H)  3 units Novolog     Admit with: Unresponsive due to Hypoglycemia  History: DM  Home DM Meds: Novolog 7 units with Breakfast/ 10 units with Lunch/ 10 units with Dinner       Tresiba 12 units QHS  Current Orders: Semglee 26 units daily      Novolog 2 units TID with meals      Novolog Moderate Correction Scale/ SSI (0-15 units) TID AC    Note pt saw hr Endocrinologist on 05/13/2021 (see below)  Spoke w/ Dr. Wynelle Cleveland by phone this AM--Dr. Wynelle Cleveland currently working with the RN caring for patient to dose insulins based on food intake, CBGs.  Per Dr. Wynelle Cleveland, food intake and Insulin dosing at home very labile.    Addendum 12:30pm--Met w/ pt in the ED.  Husband at bedside.  No other family present.  Pt unable to have meaningful conversation as she appeared lethargic and would not keep her eyes open or answer any of my questions.  Husband did not provide much info to me.  Verified pt takes her own insulin and usually only eats once per day.  Did not know when next ENDO appt is.  Discussed with husband that the plan is to monitor pt's food intake and insulin needs over the next 12-24 hours and try  to come up with a safe discharge plan.  Discussed with husband briefly that pt should be eating 3 times per day at home, monitoring her CBGs at least TID at home, and taking insulin as prescribed by Dr. Osvaldo Angst.  Discussed with husband that the diabetes team will come see pt again tomorrow to further discuss home diabetes habits, routine and to provide advice for home.    Endocrinologist: Dr. Joneen Caraway with 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 If blood glucose level is 201 - 250, give 3 units of insulin. If blood glucose level is 251 - 300, give 5 units of insulin. If blood glucose level is 301 - 350, give 7 units of insulin. If blood glucose level is > 350, give 9 units of insulin. At bedtime: Use following correction scale novolog If blood glucose level is 201 - 300, give 1 units of insulin. If blood glucose level is 301 - 400, give 2 units of insulin. If blood glucose level is 401 or above 3 units of insulin  --  Will follow patient during hospitalization--  Wyn Quaker RN, MSN, CDE Diabetes Coordinator Inpatient Glycemic Control Team Team Pager: [phone] (8a-5p)

## 2021-06-13 NOTE — ED Notes (Signed)
Called spoke to [employer] patient still waiting bed assignment [phone]

## 2021-06-13 NOTE — Progress Notes (Signed)
*  PRELIMINARY RESULTS* Echocardiogram 2D Echocardiogram has been performed.  [name] Gilbert 06/13/2021, 2:34 PM

## 2021-06-13 NOTE — Progress Notes (Signed)
PROGRESS NOTE    [name] Gilbert   JHE:[id number]  DOB: [birth year]  DOA: 06/12/2021 PCP: Tobey Grim, MD   Brief Narrative:  [name] Gilbert is a 60 year old female with a history of diabetes mellitus on insulin and CVA who was found to be unresponsive at home.  When EMS arrived, the patient was noted to have a glucose of 30.  An IV line could not be established by EMS. Upon arrival to the ED, CBG was 18 and an IV was unable to be obtained despite multiple attempts using the ultrasound.  A central line was placed and D50 was given.  The patient became more alert but was confused. CBG improved to 262. Also noted, creatinine was 1.69 where her baseline is around 1.0. She was given a 1 L normal saline bolus in the ED.  Subjective: The patient states that she is feeling extremely tired and slowed this morning.  She has no other complaints.  We have had an extensive discussion about her sugars.  Her daughter is at bedside and is able to give more input.  The patient and daughter state that typically, the patient eats only 1 meal a day which is often somewhere between 4 and 6 PM.  When she checks her sugars about 3 -5 hours later sometimes they are running in the 400s.  When she wakes up in the morning sometimes she has sugars in the 70s and other times she has sugars which are in the 400s.  She typically will eat something small if her sugars are running in the 70s.  She gives herself 30 units of long-acting insulin daily and despite giving herself this much insulin in the morning, her sugars will range in the 4-5 100s in the evening. We have discussed eating multiple small meals a day.    Assessment & Plan:   Principal Problem:   Hypoglycemia associated with diabetes mellitus type 2 -Please see above discussion in regards to the patient's oral intake and insulin usage - I have discussed the solution with the family and the patient and we have decided that the patient will begin to eat  at least 2 meals a day-I have spoken with the diabetes coordinator to teach the patient carb counting so that she is receiving a consistent amount of carbohydrates that each meal - At this point we are going to see how much lunch she eats prior to giving her long-acting insulin-I have also lowered her mealtime insulin (she typically gives herself a range between 0 to 30 units) and will be following her sugars carefully and adjusting insulin as needed - I have also ordered a dietitian consult for teaching purposes -Her last A1c on in 2/221 was 11.1 - A1c has been ordered and is pending  Active Problems: Neurological abnormality - In the ED, patient was noted to be lethargic with right-sided hemiplegia and gaze deviation to the right - Code stroke was activated however, this was most likely secondary to having a CBG of 18 - Neurology following-EEG is pending-she has been started on Keppra    HTN (hypertension) -Continue amlodipine - Hold HCTZ and Aldactone in the setting of rising creatinine -There is no echo on the chart-I would like to obtain this before placing the patient back on diuretics-if her sugars are uncontrolled, she does not need additional diuretics as she will be more prone to being dehydrated  AKI/dehydration, hyponatremia - The patient's creatinine on 10/2 was 1.69 and has risen to 1.80 - Creatinine in  1/21 was 0.97 but more recently this year it has ranged from 1.2-1.4 which would place her in the CKD stage IIIa category - Change half-normal saline to normal saline and continue to infuse today-recheck creatinine tomorrow morning    Time spent in minutes: 35 DVT prophylaxis: Lovenox Code Status: Full code Family Communication: Daughter at bedside  Level of Care: Level of care: Med-Surg Disposition Plan:  Status is: Observation  The patient will require care spanning > 2 midnights and should be moved to inpatient because: IV treatments appropriate due to intensity of  illness or inability to take PO  Dispo: The patient is from: Home              Anticipated d/c is to: Home              Patient currently is not medically stable to d/c.   Difficult to place patient No  Consultants:  Neurology Procedures:   Antimicrobials:  Anti-infectives (From admission, onward)    None        Objective: Vitals:   06/13/21 0600 06/13/21 0915 06/13/21 0930 06/13/21 1000  BP: 96/83 136/73 137/78 138/77  Pulse: 70 (!) 51 (!) 51 (!) 54  Resp: Temp:      TempSrc:      SpO2: 100% 100% 100% 100%  Weight:      Height:        Intake/Output Summary (Last 24 hours) at 06/13/2021 1056 Last data filed at 06/13/2021 0636 Gross per 24 hour  Intake 215.49 ml  Output 400 ml  Net -184.51 ml   Filed Weights   06/12/21 0839  Weight: 81.6 kg    Examination: General exam: Well-built well-nourished female laying in bed, appears fatigued but appears comfortable  HEENT: PERRLA, oral mucosa moist, no sclera icterus or thrush Respiratory system: Clear to auscultation. Respiratory effort normal. Cardiovascular system: S1 & S2 heard, RRR.   Gastrointestinal system: Abdomen soft, non-tender, nondistended. Normal bowel sounds. Central nervous system: Alert and oriented. No focal neurological deficits. Extremities: No cyanosis, clubbing or edema Skin: No rashes or ulcers Psychiatry:  Mood & affect appropriate.     Data Reviewed: I have personally reviewed following labs and imaging studies  CBC: Recent Labs  Lab 06/12/21 0342  WBC 7.6  HGB 10.9*  HCT 33.2*  MCV 88.5  PLT 233   Basic Metabolic Panel: Recent Labs  Lab 06/12/21 0342 06/13/21 0756  NA 137 133*  K 4.0 4.1  CL 105 105  CO2 27 23  GLUCOSE 262* 163*  BUN 37* 33*  CREATININE 1.69* 1.80*  CALCIUM 8.2* 7.6*   GFR: Estimated Creatinine Clearance: 32.1 mL/min (A) (by C-G formula based on SCr of 1.8 mg/dL (H)). Liver Function Tests: Recent Labs  Lab 06/12/21 0342  AST 24  ALT  14  ALKPHOS 91  BILITOT 0.6  PROT 7.1  ALBUMIN 3.0*   Recent Labs  Lab 06/12/21 0556  LIPASE 21   No results for input(s): AMMONIA in the last 168 hours. Coagulation Profile: Recent Labs  Lab 06/12/21 1520  INR 1.1   Cardiac Enzymes: No results for input(s): CKTOTAL, CKMB, CKMBINDEX, TROPONINI in the last 168 hours. BNP (last 3 results) No results for input(s): PROBNP in the last 8760 hours. HbA1C: No results for input(s): HGBA1C in the last 72 hours. CBG: Recent Labs  Lab 06/12/21 1845 06/12/21 1942 06/12/21 2317 06/13/21 0255 06/13/21 0755  GLUCAP 61* 118* 87 158* 160*   Lipid  Profile: No results for input(s): CHOL, HDL, LDLCALC, TRIG, CHOLHDL, LDLDIRECT in the last 72 hours. Thyroid Function Tests: No results for input(s): TSH, T4TOTAL, FREET4, T3FREE, THYROIDAB in the last 72 hours. Anemia Panel: No results for input(s): VITAMINB12, FOLATE, FERRITIN, TIBC, IRON, RETICCTPCT in the last 72 hours. Urine analysis:    Component Value Date/Time   COLORURINE YELLOW (A) 06/12/2021 0342   APPEARANCEUR CLEAR (A) 06/12/2021 0342   APPEARANCEUR Clear 12/06/2014 0133   LABSPEC 1.014 06/12/2021 0342   LABSPEC 1.011 12/06/2014 0133   PHURINE 5.0 06/12/2021 0342   GLUCOSEU >=500 (A) 06/12/2021 0342   GLUCOSEU >=500 12/06/2014 0133   HGBUR NEGATIVE 06/12/2021 0342   BILIRUBINUR NEGATIVE 06/12/2021 0342   BILIRUBINUR Negative 12/06/2014 0133   KETONESUR NEGATIVE 06/12/2021 0342   PROTEINUR 100 (A) 06/12/2021 0342   NITRITE NEGATIVE 06/12/2021 0342   LEUKOCYTESUR NEGATIVE 06/12/2021 0342   LEUKOCYTESUR Negative 12/06/2014 0133   Sepsis Labs: @LABRCNTIP (procalcitonin:4,lacticidven:4) ) Recent Results (from the past 240 hour(s))  Resp Panel by RT-PCR (Flu A&B, Covid) Nasopharyngeal Swab     Status: None   Collection Time: 06/12/21  3:42 AM   Specimen: Nasopharyngeal Swab; Nasopharyngeal(NP) swabs in vial transport medium  Result Value Ref Range Status   SARS  Coronavirus 2 by RT PCR NEGATIVE NEGATIVE Final    Comment: (NOTE) SARS-CoV-2 target nucleic acids are NOT DETECTED.  The SARS-CoV-2 RNA is generally detectable in upper respiratory specimens during the acute phase of infection. The lowest concentration of SARS-CoV-2 viral copies this assay can detect is 138 copies/mL. A negative result does not preclude SARS-Cov-2 infection and should not be used as the sole basis for treatment or other patient management decisions. A negative result may occur with  improper specimen collection/handling, submission of specimen other than nasopharyngeal swab, presence of viral mutation(s) within the areas targeted by this assay, and inadequate number of viral copies(<138 copies/mL). A negative result must be combined with clinical observations, patient history, and epidemiological information. The expected result is Negative.  Fact Sheet for Patients:  08/12/21  Fact Sheet for Healthcare Providers:  BloggerCourse.com  This test is no t yet approved or cleared by the [employer] FDA and  has been authorized for detection and/or diagnosis of SARS-CoV-2 by FDA under an Emergency Use Authorization (EUA). This EUA will remain  in effect (meaning this test can be used) for the duration of the COVID-19 declaration under Section 564(b)(1) of the Act, 21 U.S.C.section 360bbb-3(b)(1), unless the authorization is terminated  or revoked sooner.       Influenza A by PCR NEGATIVE NEGATIVE Final   Influenza B by PCR NEGATIVE NEGATIVE Final    Comment: (NOTE) The Xpert Xpress SARS-CoV-2/FLU/RSV plus assay is intended as an aid in the diagnosis of influenza from Nasopharyngeal swab specimens and should not be used as a sole basis for treatment. Nasal washings and aspirates are unacceptable for Xpert Xpress SARS-CoV-2/FLU/RSV testing.  Fact Sheet for  Patients: [place]  Fact Sheet for Healthcare Providers: BloggerCourse.com  This test is not yet approved or cleared by the [employer] FDA and has been authorized for detection and/or diagnosis of SARS-CoV-2 by FDA under an Emergency Use Authorization (EUA). This EUA will remain in effect (meaning this test can be used) for the duration of the COVID-19 declaration under Section 564(b)(1) of the Act, 21 U.S.C. section 360bbb-3(b)(1), unless the authorization is terminated or revoked.  Performed at [facility], [address], [place], [place]  Radiology Studies: CT ABDOMEN PELVIS WO CONTRAST  Result Date: 06/12/2021 CLINICAL DATA:  Acute abdominal pain. EXAM: CT ABDOMEN AND PELVIS WITHOUT CONTRAST TECHNIQUE: Multidetector CT imaging of the abdomen and pelvis was performed following the standard protocol without IV contrast. COMPARISON:  None. FINDINGS: Lower chest: Normal heart size. Minimal atelectasis lower lobes bilaterally. No pleural effusion. Hepatobiliary: The liver is nodular in contour. Gallbladder is unremarkable. No intrahepatic or extrahepatic biliary ductal dilatation. Gallbladder is unremarkable. Pancreas: Atrophy of the pancreatic parenchyma. Spleen: Unremarkable Adrenals/Urinary Tract: Mild bilateral adrenal gland thickening. Kidneys are symmetric in size. Abnormal contour along the inferior margin of the left kidney (image 31; series 2). No hydronephrosis. No nephroureterolithiasis. Stomach/Bowel: Normal morphology of the stomach. No abnormal bowel wall thickening or evidence for bowel obstruction. No free fluid or free intraperitoneal air. Normal appendix. Vascular/Lymphatic: Normal caliber abdominal aorta. No retroperitoneal lymphadenopathy. Reproductive: Unremarkable.  No pelvic masses. Other: None. Musculoskeletal: No aggressive or acute appearing osseous lesions. IMPRESSION: No  acute process within the abdomen or pelvis. Abnormal contour along the inferior margin of the left kidney, potentially normal for the patient or secondary to atrophy. The possibility of underlying mass causing this contour abnormality is not excluded. When patient clinically able, recommend further evaluation of the kidneys with contrast-enhanced CT. Additionally, patient may benefit from renal ultrasound prior to the CT, if they are unable to receive contrast due to elevated creatinine. Nodular contour of the liver raising the possibility of cirrhosis. Electronically Signed   By: Annia Belt M.D.   On: 06/12/2021 09:36   MR ANGIO HEAD WO CONTRAST  Result Date: 06/12/2021 CLINICAL DATA:  Neuro deficit, acute, stroke suspected. Unresponsive. Hypoglycemia. EXAM: MRI HEAD WITHOUT CONTRAST MRA HEAD WITHOUT CONTRAST MRA NECK WITHOUT CONTRAST TECHNIQUE: Multiplanar, multiecho pulse sequences of the brain and surrounding structures were obtained without intravenous contrast. Angiographic images of the Circle of Willis were obtained using MRA technique without intravenous contrast. Angiographic images of the neck were obtained using MRA technique without intravenous contrast. Carotid stenosis measurements (when applicable) are obtained utilizing NASCET criteria, using the distal internal carotid diameter as the denominator. COMPARISON:  Head CT 06/12/2021 FINDINGS: MRI HEAD FINDINGS Multiple sequences are severely motion degraded. Brain: Within limitations of motion, no acute infarct, intracranial hemorrhage, gross mass/mass effect, or extra-axial fluid collection is identified. A large chronic left PCA infarct and small chronic right occipital and right parietal infarcts are again noted. There is ex vacuo dilatation of the posterior left lateral ventricle. T2 hyperintensities elsewhere in the cerebral white matter bilaterally are nonspecific but compatible with moderate chronic small vessel ischemic disease. There is a  chronic right pontine infarct. There is also a chronic lacunar infarct in the left basal ganglia. Vascular: Major intracranial vascular flow voids are grossly preserved. Skull and upper cervical spine: No gross skull lesion. Sinuses/Orbits: Unremarkable orbits. Paranasal sinuses and mastoid air cells are clear. Other: None. MRA HEAD FINDINGS The study is moderately motion degraded. The intracranial vertebral arteries are patent to the basilar without evidence of significant stenosis. Patent left PICA and bilateral SCA origins are identified. AICAs and a right PICA are not clearly seen. The basilar artery is widely patent. Posterior communicating arteries are small or absent. The right PCA is patent without evidence of a significant proximal stenosis. There are severe left P2 stenoses with severely decreased flow or occlusion of more distal left PCA branch vessels corresponding to the chronic infarct. The internal carotid arteries are patent from skull base to carotid termini without  evidence of a significant stenosis. ACAs and MCAs are patent without evidence of a proximal branch occlusion. There are severe distal M1 and proximal M2 stenoses bilaterally. The left A1 segment is absent with the right A1 segment supplying both A2 segments. There is a mild-to-moderate right A1 origin stenosis. No aneurysm is identified. MRA NECK FINDINGS The study is limited by severe motion artifact and noncontrast technique. Flow is present in the included portions of the common carotid and cervical internal carotid arteries bilaterally. There is no evidence of a flow limiting stenosis at either carotid bifurcation. Antegrade flow is also evident in the vertebral arteries which are codominant. IMPRESSION: 1. Severely motion degraded head MRI without an acute intracranial abnormality identified. 2. Chronic ischemia with multiple old infarcts as above. 3. No emergent large vessel occlusion. 4. Severe left P2 PCA stenoses with severely  decreased flow/occlusion of more distal left PCA branch vessels corresponding to a chronic infarct. 5. Severe distal M1 and proximal M2 stenoses bilaterally. 6. Mild-to-moderate right A1 origin stenosis. 7. Severely limited neck MRA. Gross patency of the included portions of the cervical carotid and vertebral arteries. Electronically Signed   By: Sebastian Ache M.D.   On: 06/12/2021 15:25   MR ANGIO NECK WO CONTRAST  Result Date: 06/12/2021 CLINICAL DATA:  Neuro deficit, acute, stroke suspected. Unresponsive. Hypoglycemia. EXAM: MRI HEAD WITHOUT CONTRAST MRA HEAD WITHOUT CONTRAST MRA NECK WITHOUT CONTRAST TECHNIQUE: Multiplanar, multiecho pulse sequences of the brain and surrounding structures were obtained without intravenous contrast. Angiographic images of the Circle of Willis were obtained using MRA technique without intravenous contrast. Angiographic images of the neck were obtained using MRA technique without intravenous contrast. Carotid stenosis measurements (when applicable) are obtained utilizing NASCET criteria, using the distal internal carotid diameter as the denominator. COMPARISON:  Head CT 06/12/2021 FINDINGS: MRI HEAD FINDINGS Multiple sequences are severely motion degraded. Brain: Within limitations of motion, no acute infarct, intracranial hemorrhage, gross mass/mass effect, or extra-axial fluid collection is identified. A large chronic left PCA infarct and small chronic right occipital and right parietal infarcts are again noted. There is ex vacuo dilatation of the posterior left lateral ventricle. T2 hyperintensities elsewhere in the cerebral white matter bilaterally are nonspecific but compatible with moderate chronic small vessel ischemic disease. There is a chronic right pontine infarct. There is also a chronic lacunar infarct in the left basal ganglia. Vascular: Major intracranial vascular flow voids are grossly preserved. Skull and upper cervical spine: No gross skull lesion.  Sinuses/Orbits: Unremarkable orbits. Paranasal sinuses and mastoid air cells are clear. Other: None. MRA HEAD FINDINGS The study is moderately motion degraded. The intracranial vertebral arteries are patent to the basilar without evidence of significant stenosis. Patent left PICA and bilateral SCA origins are identified. AICAs and a right PICA are not clearly seen. The basilar artery is widely patent. Posterior communicating arteries are small or absent. The right PCA is patent without evidence of a significant proximal stenosis. There are severe left P2 stenoses with severely decreased flow or occlusion of more distal left PCA branch vessels corresponding to the chronic infarct. The internal carotid arteries are patent from skull base to carotid termini without evidence of a significant stenosis. ACAs and MCAs are patent without evidence of a proximal branch occlusion. There are severe distal M1 and proximal M2 stenoses bilaterally. The left A1 segment is absent with the right A1 segment supplying both A2 segments. There is a mild-to-moderate right A1 origin stenosis. No aneurysm is identified. MRA NECK FINDINGS The  study is limited by severe motion artifact and noncontrast technique. Flow is present in the included portions of the common carotid and cervical internal carotid arteries bilaterally. There is no evidence of a flow limiting stenosis at either carotid bifurcation. Antegrade flow is also evident in the vertebral arteries which are codominant. IMPRESSION: 1. Severely motion degraded head MRI without an acute intracranial abnormality identified. 2. Chronic ischemia with multiple old infarcts as above. 3. No emergent large vessel occlusion. 4. Severe left P2 PCA stenoses with severely decreased flow/occlusion of more distal left PCA branch vessels corresponding to a chronic infarct. 5. Severe distal M1 and proximal M2 stenoses bilaterally. 6. Mild-to-moderate right A1 origin stenosis. 7. Severely limited  neck MRA. Gross patency of the included portions of the cervical carotid and vertebral arteries. Electronically Signed   By: Sebastian Ache M.D.   On: 06/12/2021 15:25   MR BRAIN WO CONTRAST  Result Date: 06/12/2021 CLINICAL DATA:  Neuro deficit, acute, stroke suspected. Unresponsive. Hypoglycemia. EXAM: MRI HEAD WITHOUT CONTRAST MRA HEAD WITHOUT CONTRAST MRA NECK WITHOUT CONTRAST TECHNIQUE: Multiplanar, multiecho pulse sequences of the brain and surrounding structures were obtained without intravenous contrast. Angiographic images of the Circle of Willis were obtained using MRA technique without intravenous contrast. Angiographic images of the neck were obtained using MRA technique without intravenous contrast. Carotid stenosis measurements (when applicable) are obtained utilizing NASCET criteria, using the distal internal carotid diameter as the denominator. COMPARISON:  Head CT 06/12/2021 FINDINGS: MRI HEAD FINDINGS Multiple sequences are severely motion degraded. Brain: Within limitations of motion, no acute infarct, intracranial hemorrhage, gross mass/mass effect, or extra-axial fluid collection is identified. A large chronic left PCA infarct and small chronic right occipital and right parietal infarcts are again noted. There is ex vacuo dilatation of the posterior left lateral ventricle. T2 hyperintensities elsewhere in the cerebral white matter bilaterally are nonspecific but compatible with moderate chronic small vessel ischemic disease. There is a chronic right pontine infarct. There is also a chronic lacunar infarct in the left basal ganglia. Vascular: Major intracranial vascular flow voids are grossly preserved. Skull and upper cervical spine: No gross skull lesion. Sinuses/Orbits: Unremarkable orbits. Paranasal sinuses and mastoid air cells are clear. Other: None. MRA HEAD FINDINGS The study is moderately motion degraded. The intracranial vertebral arteries are patent to the basilar without evidence of  significant stenosis. Patent left PICA and bilateral SCA origins are identified. AICAs and a right PICA are not clearly seen. The basilar artery is widely patent. Posterior communicating arteries are small or absent. The right PCA is patent without evidence of a significant proximal stenosis. There are severe left P2 stenoses with severely decreased flow or occlusion of more distal left PCA branch vessels corresponding to the chronic infarct. The internal carotid arteries are patent from skull base to carotid termini without evidence of a significant stenosis. ACAs and MCAs are patent without evidence of a proximal branch occlusion. There are severe distal M1 and proximal M2 stenoses bilaterally. The left A1 segment is absent with the right A1 segment supplying both A2 segments. There is a mild-to-moderate right A1 origin stenosis. No aneurysm is identified. MRA NECK FINDINGS The study is limited by severe motion artifact and noncontrast technique. Flow is present in the included portions of the common carotid and cervical internal carotid arteries bilaterally. There is no evidence of a flow limiting stenosis at either carotid bifurcation. Antegrade flow is also evident in the vertebral arteries which are codominant. IMPRESSION: 1. Severely motion degraded head MRI  without an acute intracranial abnormality identified. 2. Chronic ischemia with multiple old infarcts as above. 3. No emergent large vessel occlusion. 4. Severe left P2 PCA stenoses with severely decreased flow/occlusion of more distal left PCA branch vessels corresponding to a chronic infarct. 5. Severe distal M1 and proximal M2 stenoses bilaterally. 6. Mild-to-moderate right A1 origin stenosis. 7. Severely limited neck MRA. Gross patency of the included portions of the cervical carotid and vertebral arteries. Electronically Signed   By: Sebastian Ache M.D.   On: 06/12/2021 15:25   CT HEAD CODE STROKE WO CONTRAST  Result Date: 06/12/2021 CLINICAL DATA:   Code stroke. Neuro deficit, acute, stroke suspected. Unresponsive. Hypoglycemia. EXAM: CT HEAD WITHOUT CONTRAST TECHNIQUE: Contiguous axial images were obtained from the base of the skull through the vertex without intravenous contrast. COMPARISON:  10/01/2019 FINDINGS: Brain: There is no evidence of an acute infarct, intracranial hemorrhage, mass, midline shift, or extra-axial fluid collection. A large chronic left PCA infarct is again noted with ex vacuo dilatation of the left lateral ventricle. There are also unchanged small chronic right occipital and right parietal infarcts. Dilated perivascular spaces or chronic lacunar infarcts are noted in the lentiform nuclei bilaterally. Hypodensities elsewhere in the cerebral white matter bilaterally are similar to the prior CT and nonspecific but compatible with moderate chronic small vessel ischemic disease. Vascular: Calcified atherosclerosis at the skull base. No hyperdense vessel. Skull: No acute fracture or suspicious osseous lesion. Sinuses/Orbits: Visualized paranasal sinuses and mastoid air cells are clear. Unremarkable orbits. Other: None. ASPECTS [facility] Stroke Program Early CT Score) Not scored with this history. IMPRESSION: 1. No evidence of acute intracranial abnormality. 2. Chronic ischemia with old infarcts as above. These results were communicated to Dr. Selina Cooley at 1:56 pm on 06/12/2021 by text page via the [facility] messaging system. Electronically Signed   By: Sebastian Ache M.D.   On: 06/12/2021 13:56      Scheduled Meds:  amLODipine  10 mg Oral Daily   aspirin  81 mg Oral q morning   carvedilol  25 mg Oral BID   cloNIDine  0.2 mg Oral BID   enoxaparin (LOVENOX) injection  40 mg Subcutaneous Q24H   hydrALAZINE  25 mg Oral TID   insulin aspart  0-15 Units Subcutaneous TID WC   insulin aspart  2 Units Subcutaneous TID WC   insulin glargine-yfgn  26 Units Subcutaneous Daily   pregabalin  75 mg Oral BID   rosuvastatin  20 mg Oral QHS   Continuous  Infusions:  sodium chloride     levETIRAcetam Stopped (06/13/21 0345)     LOS: 0 days      Calvert Cantor, MD [facility] Pager: [email or web address] 06/13/2021, 10:56 AM

## 2021-06-13 NOTE — Progress Notes (Signed)
Will request unit chaplain follow up visit with this patient, she was a code stroke yesterday taken to ct scan when I was available for visit, later she was sleeping.

## 2021-06-13 NOTE — ED Notes (Signed)
Informed RN bed assigned 

## 2021-06-13 NOTE — Procedures (Signed)
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 CBG 18 at that time. Gaze deviation to right with right sided weakness, concerning for seizure. Stroke eval emergently done - MRI negative for stroke. MRA negative for LVO.  Loaded with Keppra and started on Keppra standing dose.  EEG to evaluate for seizures.  Level of alertness: lethargic, asleep  AEDs during EEG study: LEV, Pregabalin  Technical aspects: This EEG study was done with scalp electrodes positioned according to the 10-20 International system of electrode placement. Electrical activity was acquired at a sampling rate of 500Hz  and reviewed with a high frequency filter of 70Hz  and a low frequency filter of 1Hz . EEG data were recorded continuously and digitally stored.   Description: No posterior dominant rhythm was seen. Sleep was characterized by vertex waves, sleep spindles (12 to 14 Hz), maximal frontocentral region. EEG showed intermittent generalized 3 to 6 Hz theta-delta slowing. Hyperventilation and photic stimulation were not performed.     ABNORMALITY - Intermittent slow, generalized  IMPRESSION: This study is suggestive of mild diffuse encephalopathy, nonspecific etiology. No seizures or epileptiform discharges were seen throughout the recording.  Annina Piotrowski 

## 2021-06-13 NOTE — ED Notes (Signed)
Bed alarm went off. Patient was sitting up and going toward the end of the stretcher. Patient was alert, asking for her husband. Patient was persuaded to move back up the stretcher and another RN was brought to bedside to assist with repositioning. Patient was given a phone to call her husband.

## 2021-06-14 DIAGNOSIS — E11649 Type 2 diabetes mellitus with hypoglycemia without coma: Secondary | ICD-10-CM | POA: Diagnosis not present

## 2021-06-14 DIAGNOSIS — R569 Unspecified convulsions: Secondary | ICD-10-CM | POA: Diagnosis not present

## 2021-06-14 LAB — BASIC METABOLIC PANEL
Anion gap: 4 — ABNORMAL LOW (ref 5–15)
BUN: 30 mg/dL — ABNORMAL HIGH (ref 8–23)
CO2: 21 mmol/L — ABNORMAL LOW (ref 22–32)
Calcium: 7.2 mg/dL — ABNORMAL LOW (ref 8.9–10.3)
Chloride: 112 mmol/L — ABNORMAL HIGH (ref 98–111)
Creatinine, Ser: 1.75 mg/dL — ABNORMAL HIGH (ref 0.44–1.00)
GFR, Estimated: 33 mL/min — ABNORMAL LOW (ref >60)
Glucose, Bld: 118 mg/dL — ABNORMAL HIGH (ref 70–99)
Potassium: 4.4 mmol/L (ref 3.5–5.1)
Sodium: 137 mmol/L (ref 135–145)

## 2021-06-14 LAB — CBC
HCT: 31.3 % — ABNORMAL LOW (ref 36.0–46.0)
Hemoglobin: 10.7 g/dL — ABNORMAL LOW (ref 12.0–15.0)
MCH: 30 pg (ref 26.0–34.0)
MCHC: 34.2 g/dL (ref 30.0–36.0)
MCV: 87.7 fL (ref 80.0–100.0)
Platelets: 223 10*3/uL (ref 150–400)
RBC: 3.57 MIL/uL — ABNORMAL LOW (ref 3.87–5.11)
RDW: 13.9 % (ref 11.5–15.5)
WBC: 3.7 10*3/uL — ABNORMAL LOW (ref 4.0–10.5)
nRBC: 0 % (ref 0.0–0.2)

## 2021-06-14 LAB — MRSA NEXT GEN BY PCR, NASAL: MRSA by PCR Next Gen: NOT DETECTED

## 2021-06-14 LAB — GLUCOSE, CAPILLARY
Glucose-Capillary: 116 mg/dL — ABNORMAL HIGH (ref 70–99)
Glucose-Capillary: 117 mg/dL — ABNORMAL HIGH (ref 70–99)
Glucose-Capillary: 126 mg/dL — ABNORMAL HIGH (ref 70–99)
Glucose-Capillary: 376 mg/dL — ABNORMAL HIGH (ref 70–99)
Glucose-Capillary: 386 mg/dL — ABNORMAL HIGH (ref 70–99)
Glucose-Capillary: 178 mg/dL — ABNORMAL HIGH (ref 70–99)
Glucose-Capillary: 213 mg/dL — ABNORMAL HIGH (ref 70–99)

## 2021-06-14 LAB — HIV ANTIBODY (ROUTINE TESTING W REFLEX): HIV Screen 4th Generation wRfx: NONREACTIVE

## 2021-06-14 MED ORDER — INSULIN GLARGINE-YFGN 100 UNIT/ML ~~LOC~~ SOLN
25.0000 [IU] | Freq: Every day | SUBCUTANEOUS | Status: DC
  Administered 2021-06-14: 25 [IU] via SUBCUTANEOUS
  Filled 2021-06-14 (×2): qty 0.25

## 2021-06-14 MED ORDER — CHLORHEXIDINE GLUCONATE CLOTH 2 % EX PADS
6.0000 | MEDICATED_PAD | Freq: Every day | CUTANEOUS | Status: DC
  Administered 2021-06-14: 6 via TOPICAL

## 2021-06-14 MED ORDER — AMLODIPINE BESYLATE 10 MG PO TABS
10.0000 mg | ORAL_TABLET | Freq: Every day | ORAL | Status: DC
  Administered 2021-06-14 – 2021-06-15 (×2): 10 mg via ORAL
  Filled 2021-06-14 (×2): qty 1

## 2021-06-14 MED ORDER — INSULIN DEGLUDEC 100 UNIT/ML ~~LOC~~ SOPN: 25.0000 [IU] | PEN_INJECTOR | SUBCUTANEOUS | Status: DC

## 2021-06-14 MED ORDER — LEVETIRACETAM 250 MG PO TABS
250.0000 mg | ORAL_TABLET | Freq: Two times a day (BID) | ORAL | Status: DC
  Administered 2021-06-14 – 2021-06-15 (×3): 250 mg via ORAL
  Filled 2021-06-14 (×6): qty 1

## 2021-06-14 NOTE — Plan of Care (Signed)
  Problem: Education: Goal: Knowledge of General Education information will improve Description: Including pain rating scale, medication(s)/side effects and non-pharmacologic comfort measures Outcome: Progressing   Problem: Clinical Measurements: Goal: Cardiovascular complication will be avoided Outcome: Progressing   Problem: Activity: Goal: Risk for activity intolerance will decrease Outcome: Progressing   Problem: Safety: Goal: Ability to remain free from injury will improve Outcome: Progressing   Problem: Coping: Goal: Level of anxiety will decrease Outcome: Progressing

## 2021-06-14 NOTE — Evaluation (Signed)
Physical Therapy Evaluation Patient Details Name: [name] Gilbert MRN: [id number] DOB: May 05, 1961 Today's Date: 06/14/2021  History of Present Illness  Pt is a 60 y.o. 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 EMT found patient to have serum glucose of 30. She has poor vascular access and IV line could not be established. She was transported to [facility] for emergent treatment. MD assessment includes: gaze deviation to the right with right-sided weakness with MRI negative for CVA and possible seizures with pt started on Keppra.   Clinical Impression  Pt was pleasant and motivated to participate during the session and put forth good effort throughout.  Pt did not require physical assistance with any functional task and demonstrated good control and stability with transfers and gait.  Pt stated subjectively that she feels weaker than at baseline and she typically ambulates without an AD but preferred the RW during our session.  Pt will benefit from HHPT upon discharge to safely address deficits listed in patient problem list for decreased caregiver assistance and eventual return to PLOF. Of note, pt's R femoral line was assessed after the session with no concerns/bleeding noted.          Recommendations for follow up therapy are one component of a multi-disciplinary discharge planning process, led by the attending physician.  Recommendations may be updated based on patient status, additional functional criteria and insurance authorization.  Follow Up Recommendations Home health PT;Supervision for mobility/OOB    Equipment Recommendations  None recommended by PT    Recommendations for Other Services       Precautions / Restrictions Precautions Precautions: Fall Restrictions Weight Bearing Restrictions: No Other Position/Activity  Restrictions: Possible seizure activity per neuro note      Mobility  Bed Mobility Overal bed mobility: Modified Independent             General bed mobility comments: Min extra time and effort only    Transfers Overall transfer level: Needs assistance Equipment used: Rolling walker (2 wheeled) Transfers: Sit to/from Stand Sit to Stand: Supervision         General transfer comment: Good eccentric and concentric control and stability  Ambulation/Gait Ambulation/Gait assistance: Supervision Gait Distance (Feet): 150 Feet Assistive device: Rolling walker (2 wheeled) Gait Pattern/deviations: Step-through pattern;Decreased step length - right;Decreased step length - left Gait velocity: decreased   General Gait Details: Slow cadence but steady with the RW with min lean on the walker for support  Stairs            Wheelchair Mobility    Modified Rankin (Stroke Patients Only)       Balance Overall balance assessment: Needs assistance   Sitting balance-Leahy Scale: Good     Standing balance support: Bilateral upper extremity supported;During functional activity Standing balance-Leahy Scale: Good                               Pertinent Vitals/Pain Pain Assessment: No/denies pain    Home Living Family/patient expects to be discharged to:: Private residence Living Arrangements: Children;Spouse/significant other Available Help at Discharge: Family;Available 24 hours/day Type of Home: House Home Access: Stairs to enter Entrance Stairs-Rails: None Entrance Stairs-Number of Steps: 2 Home Layout: One level Home Equipment: Walker - 2 wheels      Prior Function Level of Independence: Independent  Comments: Ind amb community distances without an AD, one fall in the last year related to this admission from hypoglycemia, Ind with ADLs     Hand Dominance   Dominant Hand: Right    Extremity/Trunk Assessment   Upper Extremity  Assessment Upper Extremity Assessment: LUE deficits/detail LUE Deficits / Details: chronic LUE weakness secondary to CVA    Lower Extremity Assessment Lower Extremity Assessment: Generalized weakness       Communication   Communication: No difficulties  Cognition Arousal/Alertness: Awake/alert Behavior During Therapy: WFL for tasks assessed/performed Overall Cognitive Status: Within Functional Limits for tasks assessed                                        General Comments      Exercises Total Joint Exercises Ankle Circles/Pumps: Strengthening;Both;10 reps Quad Sets: Strengthening;Both;10 reps Gluteal Sets: Strengthening;Both;10 reps Long Arc Quad: AROM;Strengthening;Both;10 reps Knee Flexion: AROM;Strengthening;Both;10 reps Marching in Standing: AROM;Strengthening;Both;5 reps;Standing (small amplitude) Other Exercises Other Exercises: HEP education for BLE APs, QS, and GS x 10 each every 1-2 hours daily   Assessment/Plan    PT Assessment Patient needs continued PT services  PT Problem List Decreased strength;Decreased activity tolerance;Decreased balance       PT Treatment Interventions DME instruction;Gait training;Stair training;Functional mobility training;Therapeutic activities;Therapeutic exercise;Balance training;Patient/family education    PT Goals (Current goals can be found in the Care Plan section)  Acute Rehab PT Goals Patient Stated Goal: To get stronger PT Goal Formulation: With patient Time For Goal Achievement: 06/27/21 Potential to Achieve Goals: Good    Frequency Min 2X/week   Barriers to discharge        Co-evaluation               AM-PAC PT "6 Clicks" Mobility  Outcome Measure Help needed turning from your back to your side while in a flat bed without using bedrails?: None Help needed moving from lying on your back to sitting on the side of a flat bed without using bedrails?: None Help needed moving to and from a  bed to a chair (including a wheelchair)?: A Little Help needed standing up from a chair using your arms (e.g., wheelchair or bedside chair)?: A Little Help needed to walk in hospital room?: A Little Help needed climbing 3-5 steps with a railing? : A Little 6 Click Score: 20    End of Session Equipment Utilized During Treatment: Gait belt Activity Tolerance: Patient tolerated treatment well Patient left: in bed;with call bell/phone within reach;with bed alarm set Nurse Communication: Mobility status;Other (comment) (Pt in need of new O2 sensor) PT Visit Diagnosis: Muscle weakness (generalized) (M62.81);Difficulty in walking, not elsewhere classified (R26.2)    Time: 3295-1884 PT Time Calculation (min) (ACUTE ONLY): 43 min   Charges:   PT Evaluation $PT Eval Moderate Complexity: 1 Mod PT Treatments $Therapeutic Exercise: 8-22 mins        D. Elly Modena PT, DPT 06/14/21, 4:17 PM

## 2021-06-14 NOTE — Plan of Care (Signed)
Nutrition Education Note   RD consulted for nutrition education regarding diabetes.   60 y.o. female with medical history significant for DM on insulin, HTN and CVA who is admitted with hypoglycemia.   Lab Results  Component Value Date   HGBA1C 11.6 (H) 06/12/2021   Met with patient in room today. MD with concerns that patient may only be eating one meal per day at home. Pt reports that she does not eat breakfast in the mornings. Pt generally eats her first meal of the day between 12:30p-1:30p which generally includes a ham sandwich. Pt reports that she eats a snack around 3:00pm which may include a piece of fruit or a peanut butter bar. Pt reports that at some point in the evening she eats dinner which may include a meat and some sort of vegetable. Pt reports that she is a picky eater and does not like a lot of foods to begin with. Pt reports that's he usually drinks crystal light or water.   Pt's lunch tray was sitting on her side table untouched today. Pt reports that she is not hungry at the moment and she is confused as to why she was brought in the hospital and is asking to go home. RD discussed with patient about diabetes and the importance of not skipping meals. Recommended for patient to eat something first thing when she gets up even if just a piece a fruit, a bar or protein shake. Pt agrees that she might be able to eat a banana or peanut butter bar in the mornings; pt also thinks she may be able to eat some graham crackers with peanut butter as she snacks on this a lot at home. Pt is reluctant to drink any Ensure or protein drinks and reports that she does not like milk, tea or juice. RD discussed with patient the importance of eating while taking her insulin. Pt reports that she does not take her insulin if she does not eat a meal and that she always eats first and then takes her insulin.   RD provided "Nutrition and Type II Diabetes" handout from the [employer].  Discussed different food groups and their effects on blood sugar, emphasizing carbohydrate-containing foods. Provided list of carbohydrates and recommended serving sizes of common foods.  Discussed importance of controlled and consistent carbohydrate intake throughout the day. Provided examples of ways to balance meals/snacks and encouraged intake of high-fiber, whole grain complex carbohydrates. Teach back method used.  Expect poor compliance. Pt seems reluctant to change from what she has been doing but does report that she will try to eat fruit or a bar when she gets up in the mornings.   Body mass index is 32.92 kg/m. Pt meets criteria for obesity  based on current BMI. Per chart, pt appears weight stable pta.   Nutrition Focused Physical Exam:  Flowsheet Row Most Recent Value  Orbital Region No depletion  Upper Arm Region No depletion  Thoracic and Lumbar Region No depletion  Buccal Region No depletion  Temple Region No depletion  Clavicle Bone Region No depletion  Clavicle and Acromion Bone Region No depletion  Scapular Bone Region No depletion  Dorsal Hand No depletion  Patellar Region No depletion  Anterior Thigh Region No depletion  Posterior Calf Region No depletion  Edema (RD Assessment) None  Hair Reviewed  Eyes Reviewed  Mouth Reviewed  Skin Reviewed  Nails Reviewed   Labs and medications reviewed.   Pt declines any supplements or  nutritional interventions at this time. No further nutrition interventions warranted at this time. RD contact information provided. If additional nutrition issues arise, please re-consult RD.  Koleen Distance MS, RD, LDN Please refer to [facility] for RD and/or RD on-call/weekend/after hours pager

## 2021-06-14 NOTE — Progress Notes (Signed)
Patient transferred from icu. Alert and oriented x 3. No complaints of pain. Assisted to bedside commode with contact guard assist. Patient sitting up in bed eating supper with no needs at this time.

## 2021-06-14 NOTE — Progress Notes (Signed)
PROGRESS NOTE    [name] Gilbert   WUJ:[id number]  DOB: 1961-06-08  DOA: 06/12/2021 PCP: Tobey Grim, MD   Brief Narrative:  [name] Gilbert is a 60 year old female with a history of diabetes mellitus on insulin and CVA who was found to be unresponsive at home.  When EMS arrived, the patient was noted to have a glucose of 30.  An IV line could not be established by EMS. Upon arrival to the ED, CBG was 18 and an IV was unable to be obtained despite multiple attempts using the ultrasound.  A central line was placed and D50 was given.  The patient became more alert but was confused. CBG improved to 262. Also noted, creatinine was 1.69 where her baseline is around 1.0. She was given a 1 L normal saline bolus in the ED.  Subjective: The patient has been adamant about going home today. She appears slightly confused to me.     Assessment & Plan:   Principal Problem:   Hypoglycemia associated with diabetes mellitus type 2 -Please see above discussion in regards to the patient's oral intake and insulin usage - I have discussed the solution with the family and the patient and we have decided that the patient will begin to eat at least 2 meals a day-I have spoken with the diabetes coordinator to teach the patient carb counting so that she is receiving a consistent amount of carbohydrates that each meal - I have ordered a dietitian consult for teaching purposes - I have extensively educated the patient and her daughter- -Her last A1c on in 2/221 was 11.1 - A1c now is 11.6 - start Tresiba 30 U tonight- follow sugars overnight and adjust insulin as needed  Active Problems: Neurological abnormality - In the ED, patient was noted to be lethargic with right-sided hemiplegia and gaze deviation to the right - Code stroke was activated however, this was most likely secondary to having a CBG of 18 - Neurology following-EEG shows encephalopathy -she has been started on Keppra - of note, she  remains slightly confused today- I am suspecting she has some mild dementia vs some cognitive injury due to prolonged hypoglycemia - I am also suspecting that she has not been taking her medications appropriately- I have communicated this to her daughter who has been at bedside daily    HTN (hypertension) -Continue amlodipine - Hold HCTZ and Aldactone in the setting of rising creatinine -There is no echo on the chart- - 2 D ECHO ordered> no heart faliure- I do not feel she needs any diuretics- I do feel she forgets to take her medications - when we gave her Amlodipine, Coreg and Cloniding (home meds) her BP dropped to 80s- all antihypertensive were held and she was started on IVF - resume Amlodipine only today - follow BP overnight  AKI/dehydration, hyponatremia - The patient's creatinine on 10/2 was 1.69 and has risen to 1.80 - Creatinine in 1/21 was 0.97 but more recently this year it has ranged from 1.2-1.4 which would place her in the CKD stage IIIa category - continue IVF today and re-assess tomorrow    Time spent in minutes: 35 DVT prophylaxis: Lovenox Code Status: Full code Family Communication: Daughter at bedside  Level of Care: Level of care: Progressive Cardiac Disposition Plan:  Status is: inpatient  The patient will require care spanning > 2 midnights and should be moved to inpatient because: IV treatments appropriate due to intensity of illness or inability to take PO  Dispo: The  patient is from: Home              Anticipated d/c is to: Home              Patient currently is not medically stable to d/c.   Difficult to place patient No  Consultants:  Neurology Procedures:   Antimicrobials:  Anti-infectives (From admission, onward)    None        Objective: Vitals:   06/14/21 1100 06/14/21 1200 06/14/21 1400 06/14/21 1600  BP: (!) 151/71 (!) 153/97 (!) 164/83 138/67  Pulse: 70 75 77 71  Resp: 16 17 13 14   Temp: 98.4 F (36.9 C) 98.4 F (36.9 C) 98.6  F (37 C) 98.2 F (36.8 C)  TempSrc:    Oral  SpO2: 100% 95% 99% 100%  Weight:      Height:        Intake/Output Summary (Last 24 hours) at 06/14/2021 1707 Last data filed at 06/14/2021 1600 Gross per 24 hour  Intake 2680.85 ml  Output 1450 ml  Net 1230.85 ml    Filed Weights   06/12/21 0839  Weight: 81.6 kg    Examination: General exam: Appears comfortable  HEENT: PERRLA, oral mucosa moist, no sclera icterus or thrush Respiratory system: Clear to auscultation. Respiratory effort normal. Cardiovascular system: S1 & S2 heard, regular rate and rhythm Gastrointestinal system: Abdomen soft, non-tender, nondistended. Normal bowel sounds   Central nervous system: Alert and oriented to person and place but not situation- No focal neurological deficits. Extremities: No cyanosis, clubbing or edema Skin: No rashes or ulcers Psychiatry:  flat affect    Data Reviewed: I have personally reviewed following labs and imaging studies  CBC: Recent Labs  Lab 06/12/21 0342 06/14/21 0854  WBC 7.6 3.7*  HGB 10.9* 10.7*  HCT 33.2* 31.3*  MCV 88.5 87.7  PLT 233 223    Basic Metabolic Panel: Recent Labs  Lab 06/12/21 0342 06/13/21 0756 06/14/21 0431  NA 137 133* 137  K 4.0 4.1 4.4  CL 105 105 112*  CO2 27 23 21*  GLUCOSE 262* 163* 118*  BUN 37* 33* 30*  CREATININE 1.69* 1.80* 1.75*  CALCIUM 8.2* 7.6* 7.2*    GFR: Estimated Creatinine Clearance: 33 mL/min (A) (by C-G formula based on SCr of 1.75 mg/dL (H)). Liver Function Tests: Recent Labs  Lab 06/12/21 0342  AST 24  ALT 14  ALKPHOS 91  BILITOT 0.6  PROT 7.1  ALBUMIN 3.0*    Recent Labs  Lab 06/12/21 0556  LIPASE 21    No results for input(s): AMMONIA in the last 168 hours. Coagulation Profile: Recent Labs  Lab 06/12/21 1520  INR 1.1    Cardiac Enzymes: No results for input(s): CKTOTAL, CKMB, CKMBINDEX, TROPONINI in the last 168 hours. BNP (last 3 results) No results for input(s): PROBNP in the  last 8760 hours. HbA1C: Recent Labs    06/12/21 0342  HGBA1C 11.6*   CBG: Recent Labs  Lab 06/14/21 0212 06/14/21 0738 06/14/21 1122 06/14/21 1559 06/14/21 1607  GLUCAP 116* 117* 126* 386* 376*    Lipid Profile: No results for input(s): CHOL, HDL, LDLCALC, TRIG, CHOLHDL, LDLDIRECT in the last 72 hours. Thyroid Function Tests: No results for input(s): TSH, T4TOTAL, FREET4, T3FREE, THYROIDAB in the last 72 hours. Anemia Panel: No results for input(s): VITAMINB12, FOLATE, FERRITIN, TIBC, IRON, RETICCTPCT in the last 72 hours. Urine analysis:    Component Value Date/Time   COLORURINE YELLOW (A) 06/12/2021 0342   APPEARANCEUR CLEAR (  A) 06/12/2021 0342   APPEARANCEUR Clear 12/06/2014 0133   LABSPEC 1.014 06/12/2021 0342   LABSPEC 1.011 12/06/2014 0133   PHURINE 5.0 06/12/2021 0342   GLUCOSEU >=500 (A) 06/12/2021 0342   GLUCOSEU >=500 12/06/2014 0133   HGBUR NEGATIVE 06/12/2021 0342   BILIRUBINUR NEGATIVE 06/12/2021 0342   BILIRUBINUR Negative 12/06/2014 0133   KETONESUR NEGATIVE 06/12/2021 0342   PROTEINUR 100 (A) 06/12/2021 0342   NITRITE NEGATIVE 06/12/2021 0342   LEUKOCYTESUR NEGATIVE 06/12/2021 0342   LEUKOCYTESUR Negative 12/06/2014 0133   Sepsis Labs: @LABRCNTIP (procalcitonin:4,lacticidven:4) ) Recent Results (from the past 240 hour(s))  Resp Panel by RT-PCR (Flu A&B, Covid) Nasopharyngeal Swab     Status: None   Collection Time: 06/12/21  3:42 AM   Specimen: Nasopharyngeal Swab; Nasopharyngeal(NP) swabs in vial transport medium  Result Value Ref Range Status   SARS Coronavirus 2 by RT PCR NEGATIVE NEGATIVE Final    Comment: (NOTE) SARS-CoV-2 target nucleic acids are NOT DETECTED.  The SARS-CoV-2 RNA is generally detectable in upper respiratory specimens during the acute phase of infection. The lowest concentration of SARS-CoV-2 viral copies this assay can detect is 138 copies/mL. A negative result does not preclude SARS-Cov-2 infection and should not be  used as the sole basis for treatment or other patient management decisions. A negative result may occur with  improper specimen collection/handling, submission of specimen other than nasopharyngeal swab, presence of viral mutation(s) within the areas targeted by this assay, and inadequate number of viral copies(<138 copies/mL). A negative result must be combined with clinical observations, patient history, and epidemiological information. The expected result is Negative.  Fact Sheet for Patients:  08/12/21  Fact Sheet for Healthcare Providers:  BloggerCourse.com  This test is no t yet approved or cleared by the [employer] FDA and  has been authorized for detection and/or diagnosis of SARS-CoV-2 by FDA under an Emergency Use Authorization (EUA). This EUA will remain  in effect (meaning this test can be used) for the duration of the COVID-19 declaration under Section 564(b)(1) of the Act, 21 U.S.C.section 360bbb-3(b)(1), unless the authorization is terminated  or revoked sooner.       Influenza A by PCR NEGATIVE NEGATIVE Final   Influenza B by PCR NEGATIVE NEGATIVE Final    Comment: (NOTE) The Xpert Xpress SARS-CoV-2/FLU/RSV plus assay is intended as an aid in the diagnosis of influenza from Nasopharyngeal swab specimens and should not be used as a sole basis for treatment. Nasal washings and aspirates are unacceptable for Xpert Xpress SARS-CoV-2/FLU/RSV testing.  Fact Sheet for Patients: [place]  Fact Sheet for Healthcare Providers: BloggerCourse.com  This test is not yet approved or cleared by the [employer] FDA and has been authorized for detection and/or diagnosis of SARS-CoV-2 by FDA under an Emergency Use Authorization (EUA). This EUA will remain in effect (meaning this test can be used) for the duration of the COVID-19 declaration under Section  564(b)(1) of the Act, 21 U.S.C. section 360bbb-3(b)(1), unless the authorization is terminated or revoked.  Performed at [facility], [address] [place], [place]   MRSA Next Gen by PCR, Nasal     Status: None   Collection Time: 06/14/21  2:37 AM   Specimen: Nasal Mucosa; Nasal Swab  Result Value Ref Range Status   MRSA by PCR Next Gen NOT DETECTED NOT DETECTED Final    Comment: (NOTE) The GeneXpert MRSA Assay (FDA approved for NASAL specimens only), is one component of a comprehensive MRSA colonization surveillance program. It  is not intended to diagnose MRSA infection nor to guide or monitor treatment for MRSA infections. Test performance is not FDA approved in patients less than 9 years old. 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 CBG 18 at that time. Gaze deviation to right with right sided weakness, concerning for seizure. Stroke eval emergently done - MRI negative for stroke. MRA negative for LVO. Loaded with Keppra and started on Keppra standing dose.  EEG to evaluate for seizures. Level of alertness: lethargic, asleep AEDs during EEG study: LEV, Pregabalin Technical aspects: This EEG study was done with scalp electrodes positioned according to the 10-20 International system of electrode placement. Electrical activity was acquired at a sampling rate of  and reviewed with a high frequency filter of  and a low frequency filter of . EEG data were recorded continuously and digitally stored.  Description: No posterior dominant rhythm was seen. Sleep was characterized by vertex waves, sleep spindles (12 to 14 Hz), maximal frontocentral region. EEG showed intermittent generalized 3 to 6 Hz theta-delta slowing. Hyperventilation and photic stimulation were not performed.   ABNORMALITY - Intermittent slow, generalized IMPRESSION: This study is suggestive of mild diffuse encephalopathy, nonspecific etiology. No seizures or epileptiform discharges were seen throughout the recording. Charlsie Quest   ECHOCARDIOGRAM COMPLETE  Result Date: Jul 08, 2021    ECHOCARDIOGRAM REPORT   Patient Name:   FATUMATA KASHANI Date of Exam: July 08, 2021 Medical Rec #:  [id number]        Height:       62.0 in Accession #:    [id number]       Weight:       180.0 lb Date of Birth:  27-Mar-1961        BSA:          1.828 m Patient Age:    60 years         BP:           103/64 mmHg Patient Gender: F                HR:           44 bpm. Exam Location:  [facility] Procedure: 2D Echo, Cardiac Doppler and Color Doppler Indications:     CHF-acute diastolic I50.31  History:         Patient has no prior history of Echocardiogram examinations.                  Stroke; Risk Factors:Diabetes and Hypertension.  Sonographer:     Cristela Blue Referring Phys:  [id number] Calvert Cantor Diagnosing Phys: Adrian Blackwater  Sonographer Comments: Suboptimal apical window. IMPRESSIONS  1. Left ventricular ejection fraction, by estimation, is 55 to 60%. The left ventricle has normal function. The left ventricle has no regional wall motion abnormalities. There is mild concentric left ventricular hypertrophy. Left ventricular diastolic parameters are consistent with Grade I diastolic dysfunction (impaired relaxation).  2. Right ventricular systolic function is normal. The right ventricular size is normal.  3. Left atrial size was mildly dilated.  4. The  mitral valve is normal in structure. Mild mitral valve regurgitation. No evidence of mitral stenosis.  5. The aortic valve is  normal in structure. Aortic valve regurgitation is not visualized. No aortic stenosis is present.  6. The inferior vena cava is normal in size with greater than 50% respiratory variability, suggesting right atrial pressure of 3 mmHg. FINDINGS  Left Ventricle: Left ventricular ejection fraction, by estimation, is 55 to 60%. The left ventricle has normal function. The left ventricle has no regional wall motion abnormalities. The left ventricular internal cavity size was normal in size. There is  mild concentric left ventricular hypertrophy. Left ventricular diastolic parameters are consistent with Grade I diastolic dysfunction (impaired relaxation). Right Ventricle: The right ventricular size is normal. No increase in right ventricular wall thickness. Right ventricular systolic function is normal. Left Atrium: Left atrial size was mildly dilated. Right Atrium: Right atrial size was normal in size. Pericardium: There is no evidence of pericardial effusion. Mitral Valve: The mitral valve is normal in structure. Mild mitral valve regurgitation. No evidence of mitral valve stenosis. Tricuspid Valve: The tricuspid valve is normal in structure. Tricuspid valve regurgitation is trivial. No evidence of tricuspid stenosis. Aortic Valve: The aortic valve is normal in structure. Aortic valve regurgitation is not visualized. No aortic stenosis is present. Aortic valve mean gradient measures 2.0 mmHg. Aortic valve peak gradient measures 3.1 mmHg. Aortic valve area, by VTI measures 2.51 cm. Pulmonic Valve: The pulmonic valve was normal in structure. Pulmonic valve regurgitation is trivial. No evidence of pulmonic stenosis. Aorta: The aortic root is normal in size and structure. Venous: The inferior vena cava is normal in size with greater than 50% respiratory variability, suggesting right atrial pressure of 3 mmHg. IAS/Shunts: No atrial level shunt detected by color flow Doppler.  LEFT VENTRICLE PLAX 2D LVIDd:         2.95 cm   Diastology LVIDs:         1.84 cm  LV e' medial:    4.35 cm/s LV PW:         1.13 cm  LV E/e' medial:  14.1 LV IVS:        1.00 cm  LV e' lateral:   5.77 cm/s LVOT diam:     2.00 cm  LV E/e' lateral: 10.6 LV SV:         52 LV SV Index:   29 LVOT Area:     3.14 cm  RIGHT VENTRICLE RV Basal diam:  3.09 cm RV S prime:     13.70 cm/s TAPSE (M-mode): 3.1 cm LEFT ATRIUM             Index       RIGHT ATRIUM           Index LA diam:        3.70 cm 2.02 cm/m  RA Area:     13.60 cm LA Vol (A2C):   41.2 ml 22.54 ml/m RA Volume:   31.00 ml  16.96 ml/m LA Vol (A4C):   50.9 ml 27.85 ml/m LA Biplane Vol: 49.5 ml 27.08 ml/m  AORTIC VALVE                   PULMONIC VALVE AV Area (Vmax):    2.25 cm    PV Vmax:        0.92 m/s AV Area (Vmean):   2.51 cm    PV Peak grad:   3.4 mmHg AV Area (VTI):  2.51 cm    RVOT Peak grad: 1 mmHg AV Vmax:           87.50 cm/s AV Vmean:          63.000 cm/s AV VTI:            0.209 m AV Peak Grad:      3.1 mmHg AV Mean Grad:      2.0 mmHg LVOT Vmax:         62.70 cm/s LVOT Vmean:        50.400 cm/s LVOT VTI:          0.167 m LVOT/AV VTI ratio: 0.80  AORTA Ao Root diam: 2.40 cm MITRAL VALVE               TRICUSPID VALVE MV Area (PHT): 3.95 cm    TR Peak grad:   17.0 mmHg MV Decel Time: 192 msec    TR Vmax:        206.00 cm/s MV E velocity: 61.30 cm/s MV A velocity: 76.70 cm/s  SHUNTS MV E/A ratio:  0.80        Systemic VTI:  0.17 m                            Systemic Diam: 2.00 cm Adrian Blackwater Electronically signed by Adrian Blackwater Signature Date/Time: 06/13/2021/3:30:50 PM    Final       Scheduled Meds:  aspirin  81 mg Oral q morning   Chlorhexidine Gluconate Cloth  6 each Topical Q0600   enoxaparin (LOVENOX) injection  40 mg Subcutaneous Q24H   insulin aspart  0-15 Units Subcutaneous TID WC   insulin aspart  2 Units Subcutaneous TID WC   levETIRAcetam  250 mg Oral BID   pregabalin  75 mg Oral BID   rosuvastatin  20 mg Oral QHS   Continuous Infusions:  sodium chloride 100  mL/hr at 06/14/21 1600     LOS: 1 day      Calvert Cantor, MD [facility] Pager: [email or web address] 06/14/2021, 5:07 PM

## 2021-06-14 NOTE — Progress Notes (Signed)
Neurology Progress Note   S:// Seen and examined No acute changes overnight.  Reportedly not eating much of her food. Primary team also concerned about her level of consciousness also concern if the medication is making her more sleepy. Home medications-antihypertensives were resumed yesterday that led to a sharp decrease in his blood pressure, requiring fluid hydration to return to normotension. Question if she is compliant to medications at home given that she is on multiple antihypertensives and is also managing her medications herself. Spoke with the daughter at bedside-who reports that she does her medications herself and for most part does a good job but sometimes may have issues with managing her insulin and antihypertensives. I asked the daughter specifically about any cognitive decline-the daughter said the family has not noticed any cognitive decline at home but she does not really change her routine much or does not venture out much for them to notice this.  She lives with her husband, who is the daughter's stepfather.  O:// Current vital signs: BP (!) 153/97   Pulse 75   Temp 98.4 F (36.9 C)   Resp 17   Ht 5\' 2"  (1.575 m)   Wt 81.6 kg   SpO2 95%   BMI 32.92 kg/m  Vital signs in last 24 hours: Temp:  [94.2 F (34.6 C)-99 F (37.2 C)] 98.4 F (36.9 C) (10/04 1200) Pulse Rate:  [36-75] 75 (10/04 1200) Resp:  [7-18] 17 (10/04 1200) BP: (84-153)/(45-101) 153/97 (10/04 1200) SpO2:  [93 %-100 %] 95 % (10/04 1200) Neurological exam She is awake alert oriented to self and the fact that she is in the hospital She expressed interest in going home.  At 1 point she just perseverated "I want to go home" Her speech is clear Naming comprehension repetition is intact Cranial examination: She has a rightward gaze preference but she is able to look in both directions.  She is able to count my fingers but says that her vision has not been so good over the past few weeks and she has an  appointment with her ophthalmologist soon.  Base with her from both sides.  Tends to the examiner on both sides.  Face appears symmetric.  Tongue and palate midline. Motor examination with antigravity strength in all fours with mild subtle drift of the left upper and lower extremity on extended testing. Sensation diminished on the left-says its baseline since her prior stroke.  No extinction. Coordination with no dysmetria   On repeat examination: More awake Following commands Insistent on going home No gaze preference or deviation Follows all commands in conversation and participates    Medications  Current Facility-Administered Medications:    0.9 %  sodium chloride infusion, , Intravenous, Continuous, Rizwan, Saima, MD, Last Rate: 100 mL/hr at 06/14/21 1200, Infusion Verify at 06/14/21 1200   acetaminophen (TYLENOL) tablet 650 mg, 650 mg, Oral, Q6H PRN **OR** acetaminophen (TYLENOL) suppository 650 mg, 650 mg, Rectal, Q6H PRN, Norins, 08/14/21, MD   acetaminophen (TYLENOL) tablet 1,000 mg, 1,000 mg, Oral, TID PRN, Norins, Rosalyn Gess, MD   aspirin chewable tablet 81 mg, 81 mg, Oral, q morning, Norins, Rosalyn Gess, MD, 81 mg at 06/14/21 0935   Chlorhexidine Gluconate Cloth 2 % PADS 6 each, 6 each, Topical, Q0600, Rizwan, Saima, MD, 6 each at 06/14/21 0428   enoxaparin (LOVENOX) injection 40 mg, 40 mg, Subcutaneous, Q24H, Norins, 08/14/21, MD, 40 mg at 06/14/21 0144   insulin aspart (novoLOG) injection 0-15 Units, 0-15 Units, Subcutaneous, TID WC,  Jacques Navy, MD, 3 Units at 06/13/21 9518   insulin aspart (novoLOG) injection 2 Units, 2 Units, Subcutaneous, TID WC, Norins, Rosalyn Gess, MD   pregabalin (LYRICA) capsule 75 mg, 75 mg, Oral, BID, Norins, Rosalyn Gess, MD, 75 mg at 06/13/21 1141   rosuvastatin (CRESTOR) tablet 20 mg, 20 mg, Oral, QHS, NorinsRosalyn Gess, MD Labs CBC    Component Value Date/Time   WBC 3.7 (L) 06/14/2021 0854   RBC 3.57 (L) 06/14/2021 0854   HGB 10.7 (L)  06/14/2021 0854   HGB 13.0 12/06/2014 0133   HCT 31.3 (L) 06/14/2021 0854   HCT 40.0 12/06/2014 0133   PLT 223 06/14/2021 0854   PLT 329 12/06/2014 0133   MCV 87.7 06/14/2021 0854   MCV 86 12/06/2014 0133   MCH 30.0 06/14/2021 0854   MCHC 34.2 06/14/2021 0854   RDW 13.9 06/14/2021 0854   RDW 14.8 (H) 12/06/2014 0133   LYMPHSABS 2.6 10/05/2019 0735   MONOABS 0.7 10/05/2019 0735   EOSABS 0.1 10/05/2019 0735   BASOSABS 0.0 10/05/2019 0735    CMP     Component Value Date/Time   NA 137 06/14/2021 0431   NA 134 (L) 12/07/2014 0606   K 4.4 06/14/2021 0431   K 4.1 12/07/2014 0606   CL 112 (H) 06/14/2021 0431   CL 101 12/07/2014 0606   CO2 21 (L) 06/14/2021 0431   CO2 27 12/07/2014 0606   GLUCOSE 118 (H) 06/14/2021 0431   GLUCOSE 265 (H) 12/07/2014 0606   BUN 30 (H) 06/14/2021 0431   BUN 21 (H) 12/07/2014 0606   CREATININE 1.75 (H) 06/14/2021 0431   CREATININE 1.36 (H) 12/07/2014 0606   CALCIUM 7.2 (L) 06/14/2021 0431   CALCIUM 8.3 (L) 12/07/2014 0606   PROT 7.1 06/12/2021 0342   PROT 8.1 12/06/2014 0133   ALBUMIN 3.0 (L) 06/12/2021 0342   ALBUMIN 3.8 12/06/2014 0133   AST 24 06/12/2021 0342   AST 36 12/06/2014 0133   ALT 14 06/12/2021 0342   ALT 17 12/06/2014 0133   ALKPHOS 91 06/12/2021 0342   ALKPHOS 93 12/06/2014 0133   BILITOT 0.6 06/12/2021 0342   BILITOT 0.3 12/06/2014 0133   GFRNONAA 33 (L) 06/14/2021 0431   GFRNONAA 44 (L) 12/07/2014 0606   GFRAA >60 10/05/2019 0735   GFRAA 51 (L) 12/07/2014 0606   A1c-11.6   EEG with no evidence of seizures. Mild diffuse encephalopathy.  Imaging I have reviewed images in epic and the results pertinent to this consultation are: CT head from 06/12/2021-no acute changes. MRI brain 06/12/2021 with large chronic left PCA infarct and small chronic right occipital and right parietal infarcts. 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  Assessment: 60 year old with multiple prior  strokes, residual mild spastic left hemiparesis, hypertension, diabetes, visual field deficits and visual acuity deficits at baseline came in with severe hypoglycemia with concern for strokelike symptoms.  There was a gaze deviation to the right with right-sided weakness concerning for seizure at that time. Emergent MRI negative for stroke Loaded with Keppra and started on Keppra 500 twice daily-concern for increasing somnolence.  I recommended this morning that her dose of Keppra be reduced to 250 mg twice daily. There is a concern of her not feeding herself well leading to these hypoglycemic episodes.  Also concern for medication compliance and underlying cognitive deficits-which could be a possibility given multiple strokes. Primary team working with dietary team/diabetes coordinator for better management of her diabetes.  From a neurological standpoint, I think the patient might have some sort of underlying cognitive disorder which has been unmasked due to this acute condition.  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  Recommendations: Management of multiple toxic metabolic derangements per primary team as you are Continue Keppra 250 twice daily-gets somnolent on higher dose. Continue antiplatelets for stroke prevention For agitation-consider Seroquel low-dose 12.5mg  at night. Can gradually increase if needed. Seizure precauations  Outpatient neurology follow-up-within 4 to 6 weeks of discharge.  Should be evaluated by outpatient neurology with a formal neuropsychological testing for underlying cognitive deficits/dementia..  Discussed in detail with the daughter at bedside. Plan d/w Dr. Butler Denmark over secure chat and phone.   -- Milon Dikes, MD Neurologist [facility] Pager: [phone]  SEIZURE PRECAUTIONS Per [facility]  statutes, patients with seizures are not allowed to drive until they have been seizure-free for six months.   Use caution when using heavy equipment or power tools. Avoid working on ladders or at heights. Take showers instead of baths. Ensure the water temperature is not too high on the home water heater. Do not go swimming alone. Do not lock yourself in a room alone (i.e. bathroom). When caring for infants or small children, sit down when holding, feeding, or changing them to minimize risk of injury to the child in the event you have a seizure. Maintain good sleep hygiene. Avoid alcohol.    If patient has another seizure, call 911 and bring them back to the ED if: A.  The seizure lasts longer than 5 minutes.      B.  The patient doesn't wake shortly after the seizure or has new problems such as difficulty seeing, speaking or moving following the seizure C.  The patient was injured during the seizure D.  The patient has a temperature over 102 F (39C) E.  The patient vomited during the seizure and now is having trouble breathing

## 2021-06-15 DIAGNOSIS — E11649 Type 2 diabetes mellitus with hypoglycemia without coma: Secondary | ICD-10-CM | POA: Diagnosis not present

## 2021-06-15 LAB — GLUCOSE, CAPILLARY
Glucose-Capillary: 108 mg/dL — ABNORMAL HIGH (ref 70–99)
Glucose-Capillary: 96 mg/dL (ref 70–99)

## 2021-06-15 LAB — BASIC METABOLIC PANEL
Anion gap: 3 — ABNORMAL LOW (ref 5–15)
BUN: 30 mg/dL — ABNORMAL HIGH (ref 8–23)
CO2: 22 mmol/L (ref 22–32)
Calcium: 7.6 mg/dL — ABNORMAL LOW (ref 8.9–10.3)
Chloride: 115 mmol/L — ABNORMAL HIGH (ref 98–111)
Creatinine, Ser: 1.85 mg/dL — ABNORMAL HIGH (ref 0.44–1.00)
GFR, Estimated: 30 mL/min — ABNORMAL LOW (ref >60)
Glucose, Bld: 169 mg/dL — ABNORMAL HIGH (ref 70–99)
Potassium: 4.4 mmol/L (ref 3.5–5.1)
Sodium: 140 mmol/L (ref 135–145)

## 2021-06-15 MED ORDER — ENOXAPARIN SODIUM 60 MG/0.6ML IJ SOSY
0.5000 mg/kg | PREFILLED_SYRINGE | INTRAMUSCULAR | Status: DC
  Filled 2021-06-15: qty 0.45

## 2021-06-15 MED ORDER — LEVETIRACETAM 250 MG PO TABS: 250.0000 mg | ORAL_TABLET | Freq: Two times a day (BID) | ORAL | 0 refills | Status: DC

## 2021-06-15 NOTE — Discharge Summary (Signed)
Physician Discharge Summary  Sinda Leedom ZOX:[id number] DOB: June 30, 1961 DOA: 06/12/2021  PCP: Tobey Grim, MD  Admit date: 06/12/2021 Discharge date: 06/15/2021  Admitted From: Home Disposition: Home  Recommendations for Outpatient Follow-up:  Follow up with PCP in 1-2 weeks   Home Health: No Equipment/Devices: None  Discharge Condition: Stable CODE STATUS: Full Diet recommendation: Carb modified  Brief/Interim Summary:  Honora Searson is a 60 year old female with a history of diabetes mellitus on insulin and CVA who was found to be unresponsive at home.  When EMS arrived, the patient was noted to have a glucose of 30.  An IV line could not be established by EMS. Upon arrival to the ED, CBG was 18 and an IV was unable to be obtained despite multiple attempts using the ultrasound.  A central line was placed and D50 was given.  The patient became more alert but was confused. CBG improved to 262. Also noted, creatinine was 1.69 where her baseline is around 1.0. She was given a 1 L normal saline bolus in the ED.  On the day of discharge I had a lengthy discussion with the patient regarding the reason for her admission and the potential hazards of suspected medication and dietary indiscretion.  Husband was present at bedside during my discussion.  I explained to the patient that her diabetes is under poor control and her sugars are very labile and given her history she is at high risk for clinical decompensation and even death with future episodes of such severe hypoglycemia.  All questions and concerns were addressed.  Despite our urging the patient refused a freestyle libre patch for continuous glucose monitoring.  She also refused any sort of home health services or DME.  At time of discharge I have discontinued her lisinopril and clonidine as I feel that her hypertension was being overtreated.  No changes made and home insulin regimen however stressed the patient urgent need to  follow-up with primary care provider for a endocrinologist as soon as possible.    Discharge Diagnoses:  Principal Problem:   Hypoglycemia associated with diabetes (HCC) Active Problems:   HTN (hypertension)   Dehydration   Headache   Abdominal pain   Neurologic abnormality   Hypoglycemia   Hypothermia  Type 2 diabetes mellitus with hypoglycemia Please see above discussion.  Have discussed with the patient at length.  She is given education on dietary control and insulin dosing.  Also discussed the dangers of a markedly elevated hemoglobin A1c.  No changes made at home medication regimen at this time.  The patient was advised to follow-up with PCP soon as possible.  Neurologic abnormality Patient was noted to be lethargic with right-sided hemiplegia and gaze deviation.  Code stroke was activated however canceled.  Imaging negative for stroke.  Imaging does demonstrate old infarcts such with neurology advice she has been started on Keppra.  Will discharge on this medication.  I strongly suspect some element of cognitive injury and medication indiscretion.  Was referred to follow-up with her neurologist at [facility].  Strongly recommend a neurocognitive evaluation  Essential hypertension Patient is on 6 antihypertensives at home.  When these medications were restarted her blood pressure dropped to systolic in the 80s.  All his antihypertensives were held.  At time of discharge will resume amlodipine and Coreg.  Hold clonidine, hydrochlorothiazide, Aldactone.  Also okay to resume hydralazine.  Follow-up with PCP.  Discharge Instructions  Discharge Instructions     Diet - low sodium heart healthy  Complete by: As directed    Increase activity slowly   Complete by: As directed       Allergies as of 06/15/2021       Reactions   Metformin Other (See Comments)   Reaction: unknown   Penicillins Hives, Nausea And Vomiting, Swelling   Has patient had a PCN reaction causing immediate rash,  facial/tongue/throat swelling, SOB or lightheadedness with hypotension: Yes Has patient had a PCN reaction causing severe rash involving mucus membranes or skin necrosis: No Has patient had a PCN reaction that required hospitalization Yes Has patient had a PCN reaction occurring within the last 10 years: No If all of the above answers are "NO", then may proceed with Cephalosporin use.   Codeine Other (See Comments)   Reaction: unknown   Hydralazine    Stomach pain   Statins Other (See Comments)   Myopathy with atorva 80        Medication List     STOP taking these medications    cloNIDine 0.1 MG tablet Commonly known as: CATAPRES   hydrochlorothiazide 50 MG tablet Commonly known as: HYDRODIURIL   lisinopril 40 MG tablet Commonly known as: ZESTRIL       TAKE these medications    acetaminophen 500 MG tablet Commonly known as: TYLENOL Take 1,000 mg by mouth daily as needed for pain.   amLODipine 10 MG tablet Commonly known as: NORVASC Take 10 mg by mouth daily.   aspirin 81 MG chewable tablet Chew 81 mg by mouth every morning.   carvedilol 12.5 MG tablet Commonly known as: COREG Take 25 mg by mouth 2 (two) times daily.   diclofenac Sodium 1 % Gel Commonly known as: VOLTAREN Apply 2 g topically in the morning, at noon, in the evening, and at bedtime.   hydrALAZINE 25 MG tablet Commonly known as: APRESOLINE Take 25 mg by mouth 3 (three) times daily.   insulin aspart 100 UNIT/ML FlexPen Commonly known as: NovoLOG FlexPen Inject 2 Units into the skin 3 (three) times daily with meals.   insulin lispro 100 UNIT/ML injection Commonly known as: HUMALOG Inject 0-30 Units into the skin daily.   levETIRAcetam 250 MG tablet Commonly known as: KEPPRA Take 1 tablet (250 mg total) by mouth 2 (two) times daily.   pregabalin 75 MG capsule Commonly known as: LYRICA Take 75 mg by mouth 2 (two) times daily.   rosuvastatin 20 MG tablet Commonly known as:  CRESTOR Take 20 mg by mouth at bedtime.   spironolactone 50 MG tablet Commonly known as: ALDACTONE Take 150 mg by mouth daily.   Evaristo Bury FlexTouch 100 UNIT/ML FlexTouch Pen Generic drug: insulin degludec Inject 30 Units into the skin daily.        Follow-up Information     Maxie Barb, Ian Bushman, MD. Schedule an appointment as soon as possible for a visit in 1 week(s).   Specialty: Neurology Why: [facility] neurologist.  Please make follow up appointment in 1-2 weeks.  Recommend neuro-cognitive evaluation               Allergies  Allergen Reactions   Metformin Other (See Comments)    Reaction: unknown   Penicillins Hives, Nausea And Vomiting and Swelling    Has patient had a PCN reaction causing immediate rash, facial/tongue/throat swelling, SOB or lightheadedness with hypotension: Yes Has patient had a PCN reaction causing severe rash involving mucus membranes or skin necrosis: No Has patient had a PCN reaction that required hospitalization Yes Has patient had  a PCN reaction occurring within the last 10 years: No If all of the above answers are "NO", then may proceed with Cephalosporin use.    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    Consultations: Neurology   Procedures/Studies: CT ABDOMEN PELVIS WO CONTRAST  Result Date: 06/12/2021 CLINICAL DATA:  Acute abdominal pain. EXAM: CT ABDOMEN AND PELVIS WITHOUT CONTRAST TECHNIQUE: Multidetector CT imaging of the abdomen and pelvis was performed following the standard protocol without IV contrast. COMPARISON:  None. FINDINGS: Lower chest: Normal heart size. Minimal atelectasis lower lobes bilaterally. No pleural effusion. Hepatobiliary: The liver is nodular in contour. Gallbladder is unremarkable. No intrahepatic or extrahepatic biliary ductal dilatation. Gallbladder is unremarkable. Pancreas: Atrophy of the pancreatic parenchyma.  Spleen: Unremarkable Adrenals/Urinary Tract: Mild bilateral adrenal gland thickening. Kidneys are symmetric in size. Abnormal contour along the inferior margin of the left kidney (image 31; series 2). No hydronephrosis. No nephroureterolithiasis. Stomach/Bowel: Normal morphology of the stomach. No abnormal bowel wall thickening or evidence for bowel obstruction. No free fluid or free intraperitoneal air. Normal appendix. Vascular/Lymphatic: Normal caliber abdominal aorta. No retroperitoneal lymphadenopathy. Reproductive: Unremarkable.  No pelvic masses. Other: None. Musculoskeletal: No aggressive or acute appearing osseous lesions. IMPRESSION: No acute process within the abdomen or pelvis. Abnormal contour along the inferior margin of the left kidney, potentially normal for the patient or secondary to atrophy. The possibility of underlying mass causing this contour abnormality is not excluded. When patient clinically able, recommend further evaluation of the kidneys with contrast-enhanced CT. Additionally, patient may benefit from renal ultrasound prior to the CT, if they are unable to receive contrast due to elevated creatinine. Nodular contour of the liver raising the possibility of cirrhosis. Electronically Signed   By: Annia Belt M.D.   On: 06/12/2021 09:36   MR ANGIO HEAD WO CONTRAST  Result Date: 06/12/2021 CLINICAL DATA:  Neuro deficit, acute, stroke suspected. Unresponsive. Hypoglycemia. EXAM: MRI HEAD WITHOUT CONTRAST MRA HEAD WITHOUT CONTRAST MRA NECK WITHOUT CONTRAST TECHNIQUE: Multiplanar, multiecho pulse sequences of the brain and surrounding structures were obtained without intravenous contrast. Angiographic images of the Circle of Willis were obtained using MRA technique without intravenous contrast. Angiographic images of the neck were obtained using MRA technique without intravenous contrast. Carotid stenosis measurements (when applicable) are obtained utilizing NASCET criteria, using the  distal internal carotid diameter as the denominator. COMPARISON:  Head CT 06/12/2021 FINDINGS: MRI HEAD FINDINGS Multiple sequences are severely motion degraded. Brain: Within limitations of motion, no acute infarct, intracranial hemorrhage, gross mass/mass effect, or extra-axial fluid collection is identified. A large chronic left PCA infarct and small chronic right occipital and right parietal infarcts are again noted. There is ex vacuo dilatation of the posterior left lateral ventricle. T2 hyperintensities elsewhere in the cerebral white matter bilaterally are nonspecific but compatible with moderate chronic small vessel ischemic disease. There is a chronic right pontine infarct. There is also a chronic lacunar infarct in the left basal ganglia. Vascular: Major intracranial vascular flow voids are grossly preserved. Skull and upper cervical spine: No gross skull lesion. Sinuses/Orbits: Unremarkable orbits. Paranasal sinuses and mastoid air cells are clear. Other: None. MRA HEAD FINDINGS The study is moderately motion degraded. The intracranial vertebral arteries are patent to the basilar without evidence of significant stenosis. Patent left PICA and bilateral SCA origins are identified. AICAs and a right PICA are not  clearly seen. The basilar artery is widely patent. Posterior communicating arteries are small or absent. The right PCA is patent without evidence of a significant proximal stenosis. There are severe left P2 stenoses with severely decreased flow or occlusion of more distal left PCA branch vessels corresponding to the chronic infarct. The internal carotid arteries are patent from skull base to carotid termini without evidence of a significant stenosis. ACAs and MCAs are patent without evidence of a proximal branch occlusion. There are severe distal M1 and proximal M2 stenoses bilaterally. The left A1 segment is absent with the right A1 segment supplying both A2 segments. There is a mild-to-moderate  right A1 origin stenosis. No aneurysm is identified. MRA NECK FINDINGS The study is limited by severe motion artifact and noncontrast technique. Flow is present in the included portions of the common carotid and cervical internal carotid arteries bilaterally. There is no evidence of a flow limiting stenosis at either carotid bifurcation. Antegrade flow is also evident in the vertebral arteries which are codominant. IMPRESSION: 1. Severely motion degraded head MRI without an acute intracranial abnormality identified. 2. Chronic ischemia with multiple old infarcts as above. 3. No emergent large vessel occlusion. 4. Severe left P2 PCA stenoses with severely decreased flow/occlusion of more distal left PCA branch vessels corresponding to a chronic infarct. 5. Severe distal M1 and proximal M2 stenoses bilaterally. 6. Mild-to-moderate right A1 origin stenosis. 7. Severely limited neck MRA. Gross patency of the included portions of the cervical carotid and vertebral arteries. Electronically Signed   By: Sebastian Ache M.D.   On: 06/12/2021 15:25   MR ANGIO NECK WO CONTRAST  Result Date: 06/12/2021 CLINICAL DATA:  Neuro deficit, acute, stroke suspected. Unresponsive. Hypoglycemia. EXAM: MRI HEAD WITHOUT CONTRAST MRA HEAD WITHOUT CONTRAST MRA NECK WITHOUT CONTRAST TECHNIQUE: Multiplanar, multiecho pulse sequences of the brain and surrounding structures were obtained without intravenous contrast. Angiographic images of the Circle of Willis were obtained using MRA technique without intravenous contrast. Angiographic images of the neck were obtained using MRA technique without intravenous contrast. Carotid stenosis measurements (when applicable) are obtained utilizing NASCET criteria, using the distal internal carotid diameter as the denominator. COMPARISON:  Head CT 06/12/2021 FINDINGS: MRI HEAD FINDINGS Multiple sequences are severely motion degraded. Brain: Within limitations of motion, no acute infarct, intracranial  hemorrhage, gross mass/mass effect, or extra-axial fluid collection is identified. A large chronic left PCA infarct and small chronic right occipital and right parietal infarcts are again noted. There is ex vacuo dilatation of the posterior left lateral ventricle. T2 hyperintensities elsewhere in the cerebral white matter bilaterally are nonspecific but compatible with moderate chronic small vessel ischemic disease. There is a chronic right pontine infarct. There is also a chronic lacunar infarct in the left basal ganglia. Vascular: Major intracranial vascular flow voids are grossly preserved. Skull and upper cervical spine: No gross skull lesion. Sinuses/Orbits: Unremarkable orbits. Paranasal sinuses and mastoid air cells are clear. Other: None. MRA HEAD FINDINGS The study is moderately motion degraded. The intracranial vertebral arteries are patent to the basilar without evidence of significant stenosis. Patent left PICA and bilateral SCA origins are identified. AICAs and a right PICA are not clearly seen. The basilar artery is widely patent. Posterior communicating arteries are small or absent. The right PCA is patent without evidence of a significant proximal stenosis. There are severe left P2 stenoses with severely decreased flow or occlusion of more distal left PCA branch vessels corresponding to the chronic infarct. The internal carotid arteries are patent from  skull base to carotid termini without evidence of a significant stenosis. ACAs and MCAs are patent without evidence of a proximal branch occlusion. There are severe distal M1 and proximal M2 stenoses bilaterally. The left A1 segment is absent with the right A1 segment supplying both A2 segments. There is a mild-to-moderate right A1 origin stenosis. No aneurysm is identified. MRA NECK FINDINGS The study is limited by severe motion artifact and noncontrast technique. Flow is present in the included portions of the common carotid and cervical internal  carotid arteries bilaterally. There is no evidence of a flow limiting stenosis at either carotid bifurcation. Antegrade flow is also evident in the vertebral arteries which are codominant. IMPRESSION: 1. Severely motion degraded head MRI without an acute intracranial abnormality identified. 2. Chronic ischemia with multiple old infarcts as above. 3. No emergent large vessel occlusion. 4. Severe left P2 PCA stenoses with severely decreased flow/occlusion of more distal left PCA branch vessels corresponding to a chronic infarct. 5. Severe distal M1 and proximal M2 stenoses bilaterally. 6. Mild-to-moderate right A1 origin stenosis. 7. Severely limited neck MRA. Gross patency of the included portions of the cervical carotid and vertebral arteries. Electronically Signed   By: Sebastian Ache M.D.   On: 06/12/2021 15:25   MR BRAIN WO CONTRAST  Result Date: 06/12/2021 CLINICAL DATA:  Neuro deficit, acute, stroke suspected. Unresponsive. Hypoglycemia. EXAM: MRI HEAD WITHOUT CONTRAST MRA HEAD WITHOUT CONTRAST MRA NECK WITHOUT CONTRAST TECHNIQUE: Multiplanar, multiecho pulse sequences of the brain and surrounding structures were obtained without intravenous contrast. Angiographic images of the Circle of Willis were obtained using MRA technique without intravenous contrast. Angiographic images of the neck were obtained using MRA technique without intravenous contrast. Carotid stenosis measurements (when applicable) are obtained utilizing NASCET criteria, using the distal internal carotid diameter as the denominator. COMPARISON:  Head CT 06/12/2021 FINDINGS: MRI HEAD FINDINGS Multiple sequences are severely motion degraded. Brain: Within limitations of motion, no acute infarct, intracranial hemorrhage, gross mass/mass effect, or extra-axial fluid collection is identified. A large chronic left PCA infarct and small chronic right occipital and right parietal infarcts are again noted. There is ex vacuo dilatation of the posterior  left lateral ventricle. T2 hyperintensities elsewhere in the cerebral white matter bilaterally are nonspecific but compatible with moderate chronic small vessel ischemic disease. There is a chronic right pontine infarct. There is also a chronic lacunar infarct in the left basal ganglia. Vascular: Major intracranial vascular flow voids are grossly preserved. Skull and upper cervical spine: No gross skull lesion. Sinuses/Orbits: Unremarkable orbits. Paranasal sinuses and mastoid air cells are clear. Other: None. MRA HEAD FINDINGS The study is moderately motion degraded. The intracranial vertebral arteries are patent to the basilar without evidence of significant stenosis. Patent left PICA and bilateral SCA origins are identified. AICAs and a right PICA are not clearly seen. The basilar artery is widely patent. Posterior communicating arteries are small or absent. The right PCA is patent without evidence of a significant proximal stenosis. There are severe left P2 stenoses with severely decreased flow or occlusion of more distal left PCA branch vessels corresponding to the chronic infarct. The internal carotid arteries are patent from skull base to carotid termini without evidence of a significant stenosis. ACAs and MCAs are patent without evidence of a proximal branch occlusion. There are severe distal M1 and proximal M2 stenoses bilaterally. The left A1 segment is absent with the right A1 segment supplying both A2 segments. There is a mild-to-moderate right A1 origin stenosis. No aneurysm is  identified. MRA NECK FINDINGS The study is limited by severe motion artifact and noncontrast technique. Flow is present in the included portions of the common carotid and cervical internal carotid arteries bilaterally. There is no evidence of a flow limiting stenosis at either carotid bifurcation. Antegrade flow is also evident in the vertebral arteries which are codominant. IMPRESSION: 1. Severely motion degraded head MRI  without an acute intracranial abnormality identified. 2. Chronic ischemia with multiple old infarcts as above. 3. No emergent large vessel occlusion. 4. Severe left P2 PCA stenoses with severely decreased flow/occlusion of more distal left PCA branch vessels corresponding to a chronic infarct. 5. Severe distal M1 and proximal M2 stenoses bilaterally. 6. Mild-to-moderate right A1 origin stenosis. 7. Severely limited neck MRA. Gross patency of the included portions of the cervical carotid and vertebral arteries. Electronically Signed   By: Sebastian Ache M.D.   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 CBG 18 at that time. Gaze deviation to right with right sided weakness, concerning for seizure. Stroke eval emergently done - MRI negative for stroke. MRA negative for LVO. Loaded with Keppra and started on Keppra standing dose.  EEG to evaluate for seizures. Level of alertness: lethargic, asleep AEDs during EEG study: LEV, Pregabalin Technical aspects: This EEG study was done with scalp electrodes positioned according to the 10-20 International system of electrode placement. Electrical activity was acquired at a sampling rate of 500Hz  and reviewed with a high frequency filter of 70Hz  and a low frequency filter of 1Hz . EEG data were recorded continuously and digitally stored. Description: No posterior dominant rhythm was seen. Sleep was characterized by vertex waves, sleep spindles (12 to 14 Hz), maximal frontocentral region. EEG showed intermittent generalized 3 to 6 Hz theta-delta slowing. Hyperventilation and photic stimulation were not performed.    ABNORMALITY - Intermittent slow, generalized IMPRESSION: This study is suggestive of mild diffuse encephalopathy, nonspecific etiology. No seizures or epileptiform discharges were seen throughout the recording.   ECHOCARDIOGRAM COMPLETE  Result Date: 06/13/2021    ECHOCARDIOGRAM REPORT   Patient Name:   ADYSON VANBUREN Date of Exam: 06/13/2021 Medical Rec #:  08/13/2021        Height:       62.0 in Accession #:    [name]       Weight:       180.0 lb Date of Birth:  04-12-1961        BSA:          1.828 m Patient Age:    60 years         BP:           103/64 mmHg Patient Gender: F                HR:           44 bpm. Exam Location:  [facility] Procedure: 2D Echo, Cardiac Doppler and Color Doppler Indications:     CHF-acute diastolic I50.31  History:         Patient has no prior history of Echocardiogram examinations.  Stroke; Risk Factors:Diabetes and Hypertension.  Sonographer:     Cristela Blue Referring Phys:  [id number] Cantor Diagnosing Phys: Adrian Blackwater  Sonographer Comments: Suboptimal apical window. IMPRESSIONS  1. Left ventricular ejection fraction, by estimation, is 55 to 60%. The left ventricle has normal function. The left ventricle has no regional wall motion abnormalities. There is mild concentric left ventricular hypertrophy. Left ventricular diastolic parameters are consistent with Grade I diastolic dysfunction (impaired relaxation).  2. Right ventricular systolic function is normal. The right ventricular size is normal.  3. Left atrial size was mildly dilated.  4. The mitral valve is normal in structure. Mild mitral valve regurgitation. No evidence of mitral stenosis.  5. The aortic valve is normal in structure. Aortic valve regurgitation is not visualized. No aortic stenosis is present.  6. The inferior vena cava is normal in size with greater than 50% respiratory variability, suggesting right atrial pressure of 3 mmHg. FINDINGS  Left Ventricle: Left ventricular  ejection fraction, by estimation, is 55 to 60%. The left ventricle has normal function. The left ventricle has no regional wall motion abnormalities. The left ventricular internal cavity size was normal in size. There is  mild concentric left ventricular hypertrophy. Left ventricular diastolic parameters are consistent with Grade I diastolic dysfunction (impaired relaxation). Right Ventricle: The right ventricular size is normal. No increase in right ventricular wall thickness. Right ventricular systolic function is normal. Left Atrium: Left atrial size was mildly dilated. Right Atrium: Right atrial size was normal in size. Pericardium: There is no evidence of pericardial effusion. Mitral Valve: The mitral valve is normal in structure. Mild mitral valve regurgitation. No evidence of mitral valve stenosis. Tricuspid Valve: The tricuspid valve is normal in structure. Tricuspid valve regurgitation is trivial. No evidence of tricuspid stenosis. Aortic Valve: The aortic valve is normal in structure. Aortic valve regurgitation is not visualized. No aortic stenosis is present. Aortic valve mean gradient measures 2.0 mmHg. Aortic valve peak gradient measures 3.1 mmHg. Aortic valve area, by VTI measures 2.51 cm. Pulmonic Valve: The pulmonic valve was normal in structure. Pulmonic valve regurgitation is trivial. No evidence of pulmonic stenosis. Aorta: The aortic root is normal in size and structure. Venous: The inferior vena cava is normal in size with greater than 50% respiratory variability, suggesting right atrial pressure of 3 mmHg. IAS/Shunts: No atrial level shunt detected by color flow Doppler.  LEFT VENTRICLE PLAX 2D LVIDd:         2.95 cm  Diastology LVIDs:         1.84 cm  LV e' medial:    4.35 cm/s LV PW:         1.13 cm  LV E/e' medial:  14.1 LV IVS:        1.00 cm  LV e' lateral:   5.77 cm/s LVOT diam:     2.00 cm  LV E/e' lateral: 10.6 LV SV:         52 LV SV Index:   29 LVOT Area:     3.14 cm  RIGHT  VENTRICLE RV Basal diam:  3.09 cm RV S prime:     13.70 cm/s TAPSE (M-mode): 3.1 cm LEFT ATRIUM             Index       RIGHT ATRIUM           Index LA diam:        3.70 cm 2.02 cm/m  RA Area:     13.60 cm  LA Vol (A2C):   41.2 ml 22.54 ml/m RA Volume:   31.00 ml  16.96 ml/m LA Vol (A4C):   50.9 ml 27.85 ml/m LA Biplane Vol: 49.5 ml 27.08 ml/m  AORTIC VALVE                   PULMONIC VALVE AV Area (Vmax):    2.25 cm    PV Vmax:        0.92 m/s AV Area (Vmean):   2.51 cm    PV Peak grad:   3.4 mmHg AV Area (VTI):     2.51 cm    RVOT Peak grad: 1 mmHg AV Vmax:           87.50 cm/s AV Vmean:          63.000 cm/s AV VTI:            0.209 m AV Peak Grad:      3.1 mmHg AV Mean Grad:      2.0 mmHg LVOT Vmax:         62.70 cm/s LVOT Vmean:        50.400 cm/s LVOT VTI:          0.167 m LVOT/AV VTI ratio: 0.80  AORTA Ao Root diam: 2.40 cm MITRAL VALVE               TRICUSPID VALVE MV Area (PHT): 3.95 cm    TR Peak grad:   17.0 mmHg MV Decel Time: 192 msec    TR Vmax:        206.00 cm/s MV E velocity: 61.30 cm/s MV A velocity: 76.70 cm/s  SHUNTS MV E/A ratio:  0.80        Systemic VTI:  0.17 m                            Systemic Diam: 2.00 cm Adrian Blackwater Electronically signed by Adrian Blackwater Signature Date/Time: 06/13/2021/3:30:50 PM    Final    CT HEAD CODE STROKE WO CONTRAST  Result Date: 06/12/2021 CLINICAL DATA:  Code stroke. Neuro deficit, acute, stroke suspected. Unresponsive. Hypoglycemia. EXAM: CT HEAD WITHOUT CONTRAST TECHNIQUE: Contiguous axial images were obtained from the base of the skull through the vertex without intravenous contrast. COMPARISON:  10/01/2019 FINDINGS: Brain: There is no evidence of an acute infarct, intracranial hemorrhage, mass, midline shift, or extra-axial fluid collection. A large chronic left PCA infarct is again noted with ex vacuo dilatation of the left lateral ventricle. There are also unchanged small chronic right occipital and right parietal infarcts. Dilated perivascular  spaces or chronic lacunar infarcts are noted in the lentiform nuclei bilaterally. Hypodensities elsewhere in the cerebral white matter bilaterally are similar to the prior CT and nonspecific but compatible with moderate chronic small vessel ischemic disease. Vascular: Calcified atherosclerosis at the skull base. No hyperdense vessel. Skull: No acute fracture or suspicious osseous lesion. Sinuses/Orbits: Visualized paranasal sinuses and mastoid air cells are clear. Unremarkable orbits. Other: None. ASPECTS [facility] Stroke Program Early CT Score) Not scored with this history. IMPRESSION: 1. No evidence of acute intracranial abnormality. 2. Chronic ischemia with old infarcts as above. These results were communicated to Dr. Selina Cooley at 1:56 pm on 06/12/2021 by text page via the [facility] messaging system. Electronically Signed   By: Sebastian Ache M.D.   On: 06/12/2021 13:56      Subjective: Patient seen and examined on the day of discharge.  Lengthy  discussion regarding hospital course and discharge instructions provided to patient husband at bedside.  Discharge Exam: Vitals:   06/15/21 0751 06/15/21 1113  BP: (!) 156/78 (!) 168/90  Pulse: 72 75  Resp: 18 18  Temp: 98.3 F (36.8 C) 98.1 F (36.7 C)  SpO2: 100% 100%   Vitals:   06/15/21 0006 06/15/21 0417 06/15/21 0751 06/15/21 1113  BP: (!) 111/59 117/63 (!) 156/78 (!) 168/90  Pulse:   72 75  Resp: Temp: 98.8 F (37.1 C) 98.5 F (36.9 C) 98.3 F (36.8 C) 98.1 F (36.7 C)  TempSrc: Oral Oral    SpO2: 100% 100% 100% 100%  Weight:      Height:        General: Pt is alert, awake, not in acute distress Cardiovascular: RRR, S1/S2 +, no rubs, no gallops Respiratory: CTA bilaterally, no wheezing, no rhonchi Abdominal: Soft, NT, ND, bowel sounds + Extremities: no edema, no cyanosis    The results of significant diagnostics from this hospitalization (including imaging, microbiology, ancillary and laboratory) are listed below for  reference.     Microbiology: Recent Results (from the past 240 hour(s))  Resp Panel by RT-PCR (Flu A&B, Covid) Nasopharyngeal Swab     Status: None   Collection Time: 06/12/21  3:42 AM   Specimen: Nasopharyngeal Swab; Nasopharyngeal(NP) swabs in vial transport medium  Result Value Ref Range Status   SARS Coronavirus 2 by RT PCR NEGATIVE NEGATIVE Final    Comment: (NOTE) SARS-CoV-2 target nucleic acids are NOT DETECTED.  The SARS-CoV-2 RNA is generally detectable in upper respiratory specimens during the acute phase of infection. The lowest concentration of SARS-CoV-2 viral copies this assay can detect is 138 copies/mL. A negative result does not preclude SARS-Cov-2 infection and should not be used as the sole basis for treatment or other patient management decisions. A negative result may occur with  improper specimen collection/handling, submission of specimen other than nasopharyngeal swab, presence of viral mutation(s) within the areas targeted by this assay, and inadequate number of viral copies(<138 copies/mL). A negative result must be combined with clinical observations, patient history, and epidemiological information. The expected result is Negative.  Fact Sheet for Patients:  BloggerCourse.com  Fact Sheet for Healthcare Providers:  SeriousBroker.it  This test is no t yet approved or cleared by the [place] FDA and  has been authorized for detection and/or diagnosis of SARS-CoV-2 by FDA under an Emergency Use Authorization (EUA). This EUA will remain  in effect (meaning this test can be used) for the duration of the COVID-19 declaration under Section 564(b)(1) of the Act, 21 U.S.C.section 360bbb-3(b)(1), unless the authorization is terminated  or revoked sooner.       Influenza A by PCR NEGATIVE NEGATIVE Final   Influenza B by PCR NEGATIVE NEGATIVE Final    Comment: (NOTE) The Xpert Xpress SARS-CoV-2/FLU/RSV  plus assay is intended as an aid in the diagnosis of influenza from Nasopharyngeal swab specimens and should not be used as a sole basis for treatment. Nasal washings and aspirates are unacceptable for Xpert Xpress SARS-CoV-2/FLU/RSV testing.  Fact Sheet for Patients: BloggerCourse.com  Fact Sheet for Healthcare Providers: SeriousBroker.it  This test is not yet approved or cleared by the [place] FDA and has been authorized for detection and/or diagnosis of SARS-CoV-2 by FDA under an Emergency Use Authorization (EUA). This EUA will remain in effect (meaning this test can be used) for the duration of the COVID-19 declaration under Section 564(b)(1) of the  Act, 21 U.S.C. section 360bbb-3(b)(1), unless the authorization is terminated or revoked.  Performed at [facility], [address] [place], [place] [address]   MRSA Next Gen by PCR, Nasal     Status: None   Collection Time: 06/14/21  2:37 AM   Specimen: Nasal Mucosa; Nasal Swab  Result Value Ref Range Status   MRSA by PCR Next Gen NOT DETECTED NOT DETECTED Final    Comment: (NOTE) The GeneXpert MRSA Assay (FDA approved for NASAL specimens only), is one component of a comprehensive MRSA colonization surveillance program. It is not intended to diagnose MRSA infection nor to guide or monitor treatment for MRSA infections. Test performance is not FDA approved in patients less than 11 years old. Performed at [facility], [address] [place], [place] [address]      Labs: BNP (last 3 results) No results for input(s): BNP in the last 8760 hours. Basic Metabolic Panel: Recent Labs  Lab 06/12/21 0342 06/13/21 0756 06/14/21 0431 06/15/21 0404  NA 137 133* 137 140  K 4.0 4.1 4.4 4.4  CL 105 105 112* 115*  CO2 27 23 21* 22  GLUCOSE 262* 163* 118* 169*  BUN 37* 33* 30* 30*  CREATININE 1.69* 1.80* 1.75* 1.85*  CALCIUM 8.2* 7.6* 7.2* 7.6*    Liver Function Tests: Recent Labs  Lab 06/12/21 0342  AST 24  ALT 14  ALKPHOS 91  BILITOT 0.6  PROT 7.1  ALBUMIN 3.0*   Recent Labs  Lab 06/12/21 0556  LIPASE 21   No results for input(s): AMMONIA in the last 168 hours. CBC: Recent Labs  Lab 06/12/21 0342 06/14/21 0854  WBC 7.6 3.7*  HGB 10.9* 10.7*  HCT 33.2* 31.3*  MCV 88.5 87.7  PLT 233 223   Cardiac Enzymes: No results for input(s): CKTOTAL, CKMB, CKMBINDEX, TROPONINI in the last 168 hours. BNP: Invalid input(s): POCBNP CBG: Recent Labs  Lab 06/14/21 1607 06/14/21 2056 06/14/21 2122 06/15/21 0752 06/15/21 1114  GLUCAP 376* 213* 178* 108* 96   D-Dimer No results for input(s): DDIMER in the last 72 hours. Hgb A1c No results for input(s): HGBA1C in the last 72 hours. Lipid Profile No results for input(s): CHOL, HDL, LDLCALC, TRIG, CHOLHDL, LDLDIRECT in the last 72 hours. Thyroid function studies No results for input(s): TSH, T4TOTAL, T3FREE, THYROIDAB in the last 72 hours.  Invalid input(s): FREET3 Anemia work up No results for input(s): VITAMINB12, FOLATE, FERRITIN, TIBC, IRON, RETICCTPCT in the last 72 hours. Urinalysis    Component Value Date/Time   COLORURINE YELLOW (A) 06/12/2021 0342   APPEARANCEUR CLEAR (A) 06/12/2021 0342   APPEARANCEUR Clear 12/06/2014 0133   LABSPEC 1.014 06/12/2021 0342   LABSPEC 1.011 12/06/2014 0133   PHURINE 5.0 06/12/2021 0342   GLUCOSEU >=500 (A) 06/12/2021 0342   GLUCOSEU >=500 12/06/2014 0133   HGBUR NEGATIVE 06/12/2021 0342   BILIRUBINUR NEGATIVE 06/12/2021 0342   BILIRUBINUR Negative 12/06/2014 0133   KETONESUR NEGATIVE 06/12/2021 0342   PROTEINUR 100 (A) 06/12/2021 0342   NITRITE NEGATIVE 06/12/2021 0342   LEUKOCYTESUR NEGATIVE 06/12/2021 0342   LEUKOCYTESUR Negative 12/06/2014 0133   Sepsis Labs Invalid input(s): PROCALCITONIN,  WBC,  LACTICIDVEN Microbiology Recent Results (from the past 240 hour(s))  Resp Panel by RT-PCR (Flu A&B, Covid)  Nasopharyngeal Swab     Status: None   Collection Time: 06/12/21  3:42 AM   Specimen: Nasopharyngeal Swab; Nasopharyngeal(NP) swabs in vial transport medium  Result Value Ref Range Status   SARS Coronavirus 2  by RT PCR NEGATIVE NEGATIVE Final    Comment: (NOTE) SARS-CoV-2 target nucleic acids are NOT DETECTED.  The SARS-CoV-2 RNA is generally detectable in upper respiratory specimens during the acute phase of infection. The lowest concentration of SARS-CoV-2 viral copies this assay can detect is 138 copies/mL. A negative result does not preclude SARS-Cov-2 infection and should not be used as the sole basis for treatment or other patient management decisions. A negative result may occur with  improper specimen collection/handling, submission of specimen other than nasopharyngeal swab, presence of viral mutation(s) within the areas targeted by this assay, and inadequate number of viral copies(<138 copies/mL). A negative result must be combined with clinical observations, patient history, and epidemiological information. The expected result is Negative.  Fact Sheet for Patients:  BloggerCourse.com  Fact Sheet for Healthcare Providers:  SeriousBroker.it  This test is no t yet approved or cleared by the [place] FDA and  has been authorized for detection and/or diagnosis of SARS-CoV-2 by FDA under an Emergency Use Authorization (EUA). This EUA will remain  in effect (meaning this test can be used) for the duration of the COVID-19 declaration under Section 564(b)(1) of the Act, 21 U.S.C.section 360bbb-3(b)(1), unless the authorization is terminated  or revoked sooner.       Influenza A by PCR NEGATIVE NEGATIVE Final   Influenza B by PCR NEGATIVE NEGATIVE Final    Comment: (NOTE) The Xpert Xpress SARS-CoV-2/FLU/RSV plus assay is intended as an aid in the diagnosis of influenza from Nasopharyngeal swab specimens and should not be  used as a sole basis for treatment. Nasal washings and aspirates are unacceptable for Xpert Xpress SARS-CoV-2/FLU/RSV testing.  Fact Sheet for Patients: BloggerCourse.com  Fact Sheet for Healthcare Providers: SeriousBroker.it  This test is not yet approved or cleared by the [place] FDA and has been authorized for detection and/or diagnosis of SARS-CoV-2 by FDA under an Emergency Use Authorization (EUA). This EUA will remain in effect (meaning this test can be used) for the duration of the COVID-19 declaration under Section 564(b)(1) of the Act, 21 U.S.C. section 360bbb-3(b)(1), unless the authorization is terminated or revoked.  Performed at [facility], [address] [place], [place] [address]   MRSA Next Gen by PCR, Nasal     Status: None   Collection Time: 06/14/21  2:37 AM   Specimen: Nasal Mucosa; Nasal Swab  Result Value Ref Range Status   MRSA by PCR Next Gen NOT DETECTED NOT DETECTED Final    Comment: (NOTE) The GeneXpert MRSA Assay (FDA approved for NASAL specimens only), is one component of a comprehensive MRSA colonization surveillance program. It is not intended to diagnose MRSA infection nor to guide or monitor treatment for MRSA infections. Test performance is not FDA approved in patients less than 49 years old. Performed at [facility], [address] [place], [place] [address]      Time coordinating discharge: Over 30 minutes  SIGNED:   Tresa Moore, MD  [facility] 06/15/2021, 2:43 PM Pager   If 7PM-7AM, please contact night-coverage

## 2021-06-15 NOTE — Evaluation (Signed)
Occupational Therapy Evaluation Patient Details Name: [name] Gilbert MRN: [id number] DOB: Mar 15, 1961 Today's Date: 06/15/2021   History of Present Illness Pt is a 60 y.o. 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 EMT found patient to have serum glucose of 30. She has poor vascular access and IV line could not be established. She was transported to [facility] for emergent treatment. MD assessment includes: gaze deviation to the right with right-sided weakness with MRI negative for CVA and possible seizures with pt started on Keppra.   Clinical Impression   Pt seen for OT evaluation this date in setting of acute hospitalization d/t hypoglycemia. Pt reports being INDEP with ADLs/ADL mobility at baseline. However, pt is somewhat poor historian. Pt requires CGA/MIN A with ADL transfers and refuses use of RW although her balance is poor. Pt requires MIN A for peri care/LB bathing in standing d/t P dynamic standing balance. Pt requires CGA/MIN A for fxl mobility to amb to chair on opposite side of room as well as safety cueing throughout. Pt with poor insight into deficits. Anticipate she will require f/u OT services as well as SUPV 24/7 for fall prevention.      Recommendations for follow up therapy are one component of a multi-disciplinary discharge planning process, led by the attending physician.  Recommendations may be updated based on patient status, additional functional criteria and insurance authorization.   Follow Up Recommendations  Home health OT;Supervision/Assistance - 24 hour    Equipment Recommendations  3 in 1 bedside commode;Tub/shower seat;Other (comment) (2ww)    Recommendations for Other Services       Precautions / Restrictions Precautions Precautions: Fall Restrictions Weight Bearing Restrictions: No Other  Position/Activity Restrictions: Possible seizure activity per neuro note      Mobility Bed Mobility               General bed mobility comments: Pt was up to commode when OT entered room with bed alarm going off.    Transfers Overall transfer level: Needs assistance   Transfers: Sit to/from Stand Sit to Stand: Min guard;Min assist         General transfer comment: Pt with poor balance, upon initial CTS from commode, she refuses to use walker and is noted to lose balance posteriorly back to commode with OT having to assist for safety and fall prevention.    Balance Overall balance assessment: Needs assistance Sitting-balance support: Feet supported Sitting balance-Leahy Scale: Good     Standing balance support: During functional activity Standing balance-Leahy Scale: Poor Standing balance comment: needs UE support, but refuses despite education from OT. Requires at least CGA for fxl mobility to prevent falls. Sways x3 and one instance of posterior LOB requiring assist for safety.                           ADL either performed or assessed with clinical judgement   ADL                                         General ADL Comments: Requires SETUP for seated UB ADLs, MIN/CGA for seated and standing LB ADLs. Demos P balance, refuses walker. Requires CGA/MIN A for fxl mobility. Staggers at least 3 times,  near fall once.     Vision   Additional Comments: Pt with baseline low vision. Unrealiable historian, states "sometimes I can see, sometimes I can't. Appears able to see shadows/big object, but notably has diffiuclty handling smaller items like lid on toothpaste, could also be r/t baseline weakness from CVA.     Perception     Praxis      Pertinent Vitals/Pain Pain Assessment: No/denies pain     Hand Dominance Right   Extremity/Trunk Assessment Upper Extremity Assessment Upper Extremity Assessment: LUE deficits/detail;RUE  deficits/detail RUE Deficits / Details: grossly WFL LUE Deficits / Details: chronic LUE weakness secondary to CVA   Lower Extremity Assessment Lower Extremity Assessment: Defer to PT evaluation;Generalized weakness       Communication Communication Communication: No difficulties   Cognition Arousal/Alertness: Awake/alert Behavior During Therapy: WFL for tasks assessed/performed Overall Cognitive Status: Within Functional Limits for tasks assessed                                     General Comments       Exercises Other Exercises Other Exercises: OT education re: role of OT, fall prevention, importance of BG monitoring, importance of recieving medical treatment as pt is threatening to leave hospital.   Shoulder Instructions      Home Living Family/patient expects to be discharged to:: Private residence Living Arrangements: Children;Spouse/significant other Available Help at Discharge: Family;Available 24 hours/day Type of Home: House Home Access: Stairs to enter [employer] of Steps: 2 Entrance Stairs-Rails: None Home Layout: One level     Bathroom Shower/Tub: Chief Strategy Officer: Standard     Home Equipment: Environmental consultant - 2 wheels          Prior Functioning/Environment Level of Independence: Independent        Comments: Ind amb community distances without an AD, one fall in the last year related to this admission from hypoglycemia, Ind with ADLs        OT Problem List: Decreased strength;Decreased activity tolerance;Impaired balance (sitting and/or standing);Decreased safety awareness;Decreased knowledge of use of DME or AE;Impaired vision/perception      OT Treatment/Interventions: Self-care/ADL training;Therapeutic exercise;Therapeutic activities;Patient/family education    OT Goals(Current goals can be found in the care plan section) Acute Rehab OT Goals Patient Stated Goal: to go home today OT Goal Formulation:  With patient Time For Goal Achievement: 06/29/21 Potential to Achieve Goals: Fair ADL Goals Pt Will Perform Grooming: with supervision;standing (with G balance while completing 2-3 g/h tasks to improve standing tolerance and balance) Pt Will Perform Lower Body Bathing: with supervision;sit to/from stand Pt Will Transfer to Toilet: with supervision;ambulating (with LRAD PRN, to/from restroom to improve balance/tolerance for fxl HH distances.)  OT Frequency: Min 1X/week   Barriers to D/C:            Co-evaluation              AM-PAC OT "6 Clicks" Daily Activity     Outcome Measure Help from another person eating meals?: None Help from another person taking care of personal grooming?: A Little Help from another person toileting, which includes using toliet, bedpan, or urinal?: A Little Help from another person bathing (including washing, rinsing, drying)?: A Little Help from another person to put on and taking off regular upper body clothing?: A Little Help from another person to put on and taking off regular lower body clothing?: A  Little 6 Click Score: 19   End of Session Equipment Utilized During Treatment: Engineer, water Communication: Mobility status  Activity Tolerance: Patient tolerated treatment well Patient left: in chair;with call bell/phone within reach;with chair alarm set  OT Visit Diagnosis: Unsteadiness on feet (R26.81);Muscle weakness (generalized) (M62.81);Other abnormalities of gait and mobility (R26.89)                Time: 5009-3818 OT Time Calculation (min): 25 min Charges:  OT General Charges $OT Visit: 1 Visit OT Evaluation $OT Eval Moderate Complexity: 1 Mod OT Treatments $Self Care/Home Management : 8-22 mins  Rejeana Brock, MS, OTR/L ascom [phone] 06/15/21, 12:06 PM

## 2021-06-15 NOTE — Progress Notes (Signed)
Inpatient Diabetes Program Recommendations  AACE/ADA: New Consensus Statement on Inpatient Glycemic Control   Target Ranges:  Prepandial:   less than 140 mg/dL      Peak postprandial:   less than 180 mg/dL (1-2 hours)      Critically ill patients:  140 - 180 mg/dL   Results for [name] Gilbert, [name] Gilbert (MRN [id number]) as of 06/15/2021 10:13  Ref. Range 06/14/2021 07:38 06/14/2021 11:22 06/14/2021 15:59 06/14/2021 16:07 06/14/2021 20:56 06/14/2021 21:22 06/15/2021 07:52  Glucose-Capillary Latest Ref Range: 70 - 99 mg/dL 527 (H) 782 (H) 423 (H) 376 (H) 213 (H) 178 (H) 108 (H)   Results for [name] Gilbert, [name] Gilbert (MRN [id number]) as of 06/15/2021 10:13  Ref. Range 06/12/2021 03:30  Glucose-Capillary Latest Ref Range: 70 - 99 mg/dL 65 (L)  Results for [name] Gilbert, [name] Gilbert (MRN [id number]) as of 06/15/2021 10:56  Ref. Range 06/12/2021 03:42  Hemoglobin A1C Latest Ref Range: 4.8 - 5.6 % 11.6 (H)   Review of Glycemic Control  Admit with: Unresponsive due to Hypoglycemia Diabetes history: DM2 Outpatient Diabetes medications: Tresiba 12 units QHS, Novolog 7 units with breakfast, Novolog 10 units with lunch and supper, plus Novolog correction Current orders for Inpatient glycemic control: Semglee 25 units QHS, Novolog 0-15 units TID with meals, Novolog 2 units TID with meals   NOTE: Per Care Everywhere, patient sees Dr. Leda Roys with [facility] Endocrinology for DM management and was last seen 05/13/21. 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 If blood glucose level is 201 - 250, give 3 units of insulin. If blood glucose level is 251 - 300, give 5 units of insulin. If blood glucose level is 301 - 350, give 7 units of insulin. If blood  glucose level is > 350, give 9 units of insulin. At bedtime: Use following correction scale novolog If blood glucose level is 201 - 300, give 1 units of insulin. If blood glucose level is 301 - 400, give 2 units of insulin. If blood glucose level is 401 or above 3 units of insulin  Spoke with patient at bedside about diabetes and home regimen for diabetes control. Patient sitting up in chair and husband at bedside as well. Patient would not make eye contact with me during discussion, kept looking down and to the side. Patient reports being followed by Endocrinologist for diabetes management and states she takes insulin like her doctor tells her. She is not able to verbalize the name of either insulin she is prescribed and she is not able to tell me the dosages; states "I have it written down at home."  Patient reports she checks glucose at home and it is up and down but not able to provide any specific values.  Discussed A1C results (11.6% on 06/12/21) and explained that current A1C indicates an average glucose of 286 mg/dl over the past 2-3 months. Discussed danger of hypoglycemia and asked if she had any idea of why she experienced significant hypoglycemia prior to admission and she stated "No, I do not."  Asked patient about using a CGM and patient reports she use to use Dexcom but needs to see her doctor about getting it again. Asked if she had knowledge about FreeStyle [place]  CGM and patient states she knows about it and states "I don't want it. I will talk to my doctor and do what my doctor tells me."  Discussed consistent meal intake and patient stated, "I will eat when I am hungry."  Asked if she could at least eat a snack bar or some other small snack if she does not want to eat a meal and she again stated, "I will eat when I am hungry and I am not going to make myself eat."  Patient states she does not have a follow up appointment with Endocrinology and she states she does not. Asked patient to call  today to get follow up appointment with Endocrinology. Throughout the conversation, patient kept sighing and shaking her head when asked questions and she stated, "You are frustrating me asking me questions. I am doing what my doctor tells me. I want to go home and I am leaving this hospital today."  Expressed concern regarding significant hypoglycemia she presented with and encouraged her to call her Endocrinologist today to get appointment and make them aware of hospitalization due to significant hypoglycemia with unresponsiveness.    Patient verbalized understanding of information discussed and reports no further questions at this time related to diabetes.  Thanks, Orlando Penner, RN, MSN, CDE Diabetes Coordinator Inpatient Diabetes Program [phone] (Team Pager)

## 2021-06-15 NOTE — TOC Transition Note (Signed)
Transition of Care [facility]) - CM/SW Discharge Note   Patient Details  Name: [name] Gilbert MRN: [id number] Date of Birth: 1961-06-21  Transition of Care [facility] CM/SW Contact:  Alberteen Sam, LCSW Phone Number: 06/15/2021, 12:18 PM   Clinical Narrative:     CSW met with patient at bedside, informed of dc today and recommendations for home health PT. Patient declines at this time does not want home health therapies or RN. Also declined any DME needs. Reports she has no problems getting to and from appointments, reports she gets her medications through the mail and that she is established with a PCP she follows up with.   No discharge needs at this time.    Final next level of care: Home/Self Care Barriers to Discharge: No Barriers Identified   Patient Goals and CMS Choice Patient states their goals for this hospitalization and ongoing recovery are:: to go home CMS Medicare.gov Compare Post Acute Care list provided to:: Patient Choice offered to / list presented to : Patient  Discharge Placement                    Patient and family notified of of transfer: 06/15/21  Discharge Plan and Services                                     Social Determinants of Health (SDOH) Interventions     Readmission Risk Interventions No flowsheet data found.

## 2021-07-13 ENCOUNTER — Encounter (HOSPITAL_COMMUNITY): Payer: Self-pay | Admitting: Radiology

## 2021-11-01 ENCOUNTER — Emergency Department: Payer: Medicare HMO

## 2021-11-01 ENCOUNTER — Other Ambulatory Visit: Payer: Self-pay

## 2021-11-01 ENCOUNTER — Emergency Department
Admission: EM | Admit: 2021-11-01 | Discharge: 2021-11-01 | Disposition: A | Discharge status: Home / Self Care | Payer: Medicare HMO | Attending: Emergency Medicine | Admitting: Emergency Medicine

## 2021-11-01 DIAGNOSIS — R410 Disorientation, unspecified: Secondary | ICD-10-CM | POA: Diagnosis not present

## 2021-11-01 DIAGNOSIS — E162 Hypoglycemia, unspecified: Secondary | ICD-10-CM | POA: Insufficient documentation

## 2021-11-01 DIAGNOSIS — I6523 Occlusion and stenosis of bilateral carotid arteries: Secondary | ICD-10-CM | POA: Diagnosis not present

## 2021-11-01 LAB — COMPREHENSIVE METABOLIC PANEL
ALT: 15 U/L (ref 0–44)
AST: 27 U/L (ref 15–41)
Albumin: 3.7 g/dL (ref 3.5–5.0)
Alkaline Phosphatase: 110 U/L (ref 38–126)
Anion gap: 11 (ref 5–15)
BUN: 27 mg/dL — ABNORMAL HIGH (ref 8–23)
CO2: 22 mmol/L (ref 22–32)
Calcium: 8.8 mg/dL — ABNORMAL LOW (ref 8.9–10.3)
Chloride: 102 mmol/L (ref 98–111)
Creatinine, Ser: 1.41 mg/dL — ABNORMAL HIGH (ref 0.44–1.00)
GFR, Estimated: 42 mL/min — ABNORMAL LOW (ref >60)
Glucose, Bld: 149 mg/dL — ABNORMAL HIGH (ref 70–99)
Potassium: 4.1 mmol/L (ref 3.5–5.1)
Sodium: 135 mmol/L (ref 135–145)
Total Bilirubin: 0.6 mg/dL (ref 0.3–1.2)
Total Protein: 9 g/dL — ABNORMAL HIGH (ref 6.5–8.1)

## 2021-11-01 LAB — CBC WITH DIFFERENTIAL/PLATELET
Abs Immature Granulocytes: 0.03 10*3/uL (ref 0.00–0.07)
Basophils Absolute: 0 10*3/uL (ref 0.0–0.1)
Basophils Relative: 0 %
Eosinophils Absolute: 0 10*3/uL (ref 0.0–0.5)
Eosinophils Relative: 0 %
HCT: 42.4 % (ref 36.0–46.0)
Hemoglobin: 13.4 g/dL (ref 12.0–15.0)
Immature Granulocytes: 1 %
Lymphocytes Relative: 20 %
Lymphs Abs: 1.2 10*3/uL (ref 0.7–4.0)
MCH: 28 pg (ref 26.0–34.0)
MCHC: 31.6 g/dL (ref 30.0–36.0)
MCV: 88.7 fL (ref 80.0–100.0)
Monocytes Absolute: 0.3 10*3/uL (ref 0.1–1.0)
Monocytes Relative: 5 %
Neutro Abs: 4.7 10*3/uL (ref 1.7–7.7)
Neutrophils Relative %: 74 %
Platelets: 309 10*3/uL (ref 150–400)
RBC: 4.78 MIL/uL (ref 3.87–5.11)
RDW: 14.6 % (ref 11.5–15.5)
WBC: 6.3 10*3/uL (ref 4.0–10.5)
nRBC: 0 % (ref 0.0–0.2)

## 2021-11-01 LAB — URINALYSIS, ROUTINE W REFLEX MICROSCOPIC
Bilirubin Urine: NEGATIVE
Glucose, UA: 150 mg/dL — AB
Ketones, ur: NEGATIVE mg/dL
Leukocytes,Ua: NEGATIVE
Nitrite: NEGATIVE
Protein, ur: >=300 mg/dL — AB
Specific Gravity, Urine: 1.011 (ref 1.005–1.030)
pH: 5 (ref 5.0–8.0)

## 2021-11-01 LAB — CBG MONITORING, ED
Glucose-Capillary: 109 mg/dL — ABNORMAL HIGH (ref 70–99)
Glucose-Capillary: 189 mg/dL — ABNORMAL HIGH (ref 70–99)

## 2021-11-01 LAB — TROPONIN I (HIGH SENSITIVITY): Troponin I (High Sensitivity): 13 ng/L (ref <18)

## 2021-11-01 NOTE — Discharge Instructions (Signed)
For your low blood sugar:  For the next several days, decrease your TRESIBA, or insulin degludec, from 12 units at bedtime to 6 units at bedtime  Tonight, I'd also cut back your NOVOLOG (insulin aspart) from 10 units at dinner to 8 units at dinner.  Call your primary doctor tomorrow to discuss your ER visit.  Make sure to eat frequent, small meals.

## 2021-11-01 NOTE — ED Triage Notes (Signed)
Pt came from home via acems. Pt cbg was 57 via on scene and ems gave glucose tablets and pt cbg was 92. At hospital arrival cbg is 109. Pt not verablly responsive but does follow commands.

## 2021-11-01 NOTE — ED Provider Notes (Signed)
[facility] Provider Note    Event Date/Time   First MD Initiated Contact with Patient 11/01/21 [phone]     (approximate)   History   Hypoglycemia   HPI  [name] Gilbert is a 61 y.o. female here with hypoglycemia.  History somewhat limited due to patient confusion.  Per report, the patient's husband states that he found her in the bed this morning staring straight ahead, minimally responsive.  She had a glassy eyed look.  She would not respond.  She has a history of fairly labile blood sugars with history of hypoglycemia similar presentations, so he checked her blood sugar and it was in the 30s.  EMS was called and patient was given glucose tablets.  CBG has improved and patient is now verbally responsive.  She reportedly has been as high as the 400s at night, but has been persistently in the 40s in the mornings for the last several days.  She denies any recent dietary changes or recent illnesses.  She has been admitted previously for labile blood sugars.  She has been taking her medications as prescribed.  Denies recent fevers or chills or illness.  No chest pain or shortness of breath.  Nurse infectious symptoms.  She states she currently feels tired but denies any other complaints.  No headaches.  There is no seizure-like activity.     Physical Exam   Triage Vital Signs: ED Triage Vitals  Enc Vitals Group     BP 11/01/21 0709 (!) 179/116     Pulse Rate 11/01/21 0709 98     Resp 11/01/21 0709 18     Temp --      Temp src --      SpO2 11/01/21 0709 97 %     Weight 11/01/21 0710 164 lb 10.9 oz (74.7 kg)     Height 11/01/21 0710 5\' 2"  (1.575 m)     Head Circumference --      Peak Flow --      Pain Score 11/01/21 0710 0     Pain Loc --      Pain Edu? --      Excl. in GC? --     Most recent vital signs: Vitals:   11/01/21 0825 11/01/21 1200  BP: (!) 165/95 (!) 155/87  Pulse: 99 98  Resp: 17 18  Temp: 98.4 F (36.9 C) 98.3 F (36.8 C)  SpO2: 96%  98%     General: Drowsy but responds, no distress. CV:  Good peripheral perfusion.  Regular rate and rhythm. Resp:  Normal effort.  Lungs clear to auscultation bilaterally. Abd:  No distention.  No tenderness. Other:  Cranial nerves grossly intact.  Strength decreased in the left due to chronic stroke-related weakness.  Normal tone.  No seizure-like activity.   ED Results / Procedures / Treatments   Labs (all labs ordered are listed, but only abnormal results are displayed) Labs Reviewed  COMPREHENSIVE METABOLIC PANEL - Abnormal; Notable for the following components:      Result Value   Glucose, Bld 149 (*)    BUN 27 (*)    Creatinine, Ser 1.41 (*)    Calcium 8.8 (*)    Total Protein 9.0 (*)    GFR, Estimated 42 (*)    All other components within normal limits  URINALYSIS, ROUTINE W REFLEX MICROSCOPIC - Abnormal; Notable for the following components:   Color, Urine YELLOW (*)    APPearance HAZY (*)    Glucose, UA 150 (*)  Hgb urine dipstick SMALL (*)    Protein, ur >=300 (*)    Bacteria, UA RARE (*)    All other components within normal limits  CBG MONITORING, ED - Abnormal; Notable for the following components:   Glucose-Capillary 109 (*)    All other components within normal limits  CBG MONITORING, ED - Abnormal; Notable for the following components:   Glucose-Capillary 189 (*)    All other components within normal limits  CBC WITH DIFFERENTIAL/PLATELET  CBG MONITORING, ED  TROPONIN I (HIGH SENSITIVITY)     EKG Sinus tachycardia, ventricular rate 105.  PR 146, QRS 70, QTc 438.  No acute ST elevations or depressions.  EKG evidence of acute ischemia or infarct.   RADIOLOGY CT head: No acute finding, stable chronic infarcts   I also independently reviewed and agree wit radiologist interpretations.   PROCEDURES:  Critical Care performed: No  .1-3 Lead EKG Interpretation Performed by: Shaune Pollack, MD Authorized by: Shaune Pollack, MD      Interpretation: normal     ECG rate:  80-105   ECG rate assessment: normal     Rhythm: sinus rhythm     Ectopy: none     Conduction: normal   Comments:     Indication: Weakness, hypoglycemia    MEDICATIONS ORDERED IN ED: Medications - No data to display   IMPRESSION / MDM / ASSESSMENT AND PLAN / ED COURSE  I reviewed the triage vital signs and the nursing notes.                               The patient is on the cardiac monitor to evaluate for evidence of arrhythmia and/or significant heart rate changes.   Ddx:  Transient hypoglycemia in the setting of brittle diabetes, AKI causing insulin retention, occult infection or ischemia causing hypoglycemia, TIA, less likely seizure, metabolic encephalopathy.   MDM:  61 year old female with extensive past medical history including fairly brittle diabetes with recurrent ED admissions for hyper and hypoglycemia here with hypoglycemia this morning causing altered mental status.  Patient seems to be slowly returning to her baseline after receiving glucose with EMS.  She remains confused more than her baseline, however.  No obvious focal neurological deficits on arrival.  Her blood sugar seems to be improving.  We will send initial fairly broad lab work evaluating for possible AKI or other reasons to have insulin buildup, as well as a CT head to evaluate for possible stroke.  Patient has a history of stroke previously.  EKG is nonischemic.   Husband present at bedside and in agreement with this plan.  Reviewed records including admission from October 2022 with fairly similar history.  CT head negative.  Given her history, she was sent for MRI which also reviewed and is negative for any kind of acute stroke or abnormality.  Patient CBGs have remained elevated and she is now back to her mental baseline.  Given very reassuring work-up with no apparent occult infectious or other ischemic causes, no evidence of stroke, patient would like to return home  which I think is reasonable.  We will have her cut her nighttime insulin in half as well as monitor her sugars closely at night with a slightly decreased dose at dinner.  She will call her endocrinologist to discuss any changes and further medication changes as indicated.  MEDICATIONS GIVEN IN ED: Medications - No data to display   Consults:  None  EMR reviewed  H&P, DC summary from 06/12/2021 admission at [facility]     FINAL CLINICAL IMPRESSION(S) / ED DIAGNOSES   Final diagnoses:  Hypoglycemia     Rx / DC Orders   ED Discharge Orders     None        Note:  This document was prepared using Dragon voice recognition software and may include unintentional dictation errors.   Shaune Pollack, MD 11/01/21 [phone]

## 2022-01-25 ENCOUNTER — Other Ambulatory Visit: Payer: Self-pay

## 2022-01-25 ENCOUNTER — Emergency Department: Payer: Medicare HMO

## 2022-01-25 ENCOUNTER — Inpatient Hospital Stay
Admit: 2022-01-25 | Discharge: 2022-01-25 | Disposition: A | Discharge status: Home / Self Care | Payer: Medicare HMO | Attending: Internal Medicine | Admitting: Internal Medicine

## 2022-01-25 ENCOUNTER — Inpatient Hospital Stay: Payer: Medicare HMO

## 2022-01-25 ENCOUNTER — Inpatient Hospital Stay
Admission: EM | Admit: 2022-01-25 | Discharge: 2022-02-02 | DRG: 065 | Disposition: A | Discharge status: Rehabilitation Facility | Payer: Medicare HMO | Attending: Osteopathic Medicine | Admitting: Osteopathic Medicine

## 2022-01-25 DIAGNOSIS — N1832 Chronic kidney disease, stage 3b: Secondary | ICD-10-CM | POA: Diagnosis present

## 2022-01-25 DIAGNOSIS — R4701 Aphasia: Secondary | ICD-10-CM | POA: Diagnosis present

## 2022-01-25 DIAGNOSIS — R296 Repeated falls: Secondary | ICD-10-CM | POA: Diagnosis present

## 2022-01-25 DIAGNOSIS — H547 Unspecified visual loss: Secondary | ICD-10-CM | POA: Diagnosis present

## 2022-01-25 DIAGNOSIS — E1165 Type 2 diabetes mellitus with hyperglycemia: Secondary | ICD-10-CM | POA: Diagnosis present

## 2022-01-25 DIAGNOSIS — R778 Other specified abnormalities of plasma proteins: Secondary | ICD-10-CM | POA: Diagnosis present

## 2022-01-25 DIAGNOSIS — I639 Cerebral infarction, unspecified: Secondary | ICD-10-CM | POA: Diagnosis not present

## 2022-01-25 DIAGNOSIS — R471 Dysarthria and anarthria: Secondary | ICD-10-CM | POA: Diagnosis present

## 2022-01-25 DIAGNOSIS — W010XXA Fall on same level from slipping, tripping and stumbling without subsequent striking against object, initial encounter: Secondary | ICD-10-CM | POA: Diagnosis present

## 2022-01-25 DIAGNOSIS — Z7189 Other specified counseling: Secondary | ICD-10-CM

## 2022-01-25 DIAGNOSIS — Z794 Long term (current) use of insulin: Secondary | ICD-10-CM | POA: Diagnosis not present

## 2022-01-25 DIAGNOSIS — E1169 Type 2 diabetes mellitus with other specified complication: Secondary | ICD-10-CM | POA: Diagnosis not present

## 2022-01-25 DIAGNOSIS — Z833 Family history of diabetes mellitus: Secondary | ICD-10-CM

## 2022-01-25 DIAGNOSIS — R569 Unspecified convulsions: Secondary | ICD-10-CM | POA: Diagnosis present

## 2022-01-25 DIAGNOSIS — K5900 Constipation, unspecified: Secondary | ICD-10-CM | POA: Diagnosis not present

## 2022-01-25 DIAGNOSIS — I69311 Memory deficit following cerebral infarction: Secondary | ICD-10-CM

## 2022-01-25 DIAGNOSIS — F32A Depression, unspecified: Secondary | ICD-10-CM | POA: Diagnosis present

## 2022-01-25 DIAGNOSIS — E785 Hyperlipidemia, unspecified: Secondary | ICD-10-CM | POA: Diagnosis not present

## 2022-01-25 DIAGNOSIS — Y92012 Bathroom of single-family (private) house as the place of occurrence of the external cause: Secondary | ICD-10-CM

## 2022-01-25 DIAGNOSIS — N179 Acute kidney failure, unspecified: Secondary | ICD-10-CM | POA: Diagnosis present

## 2022-01-25 DIAGNOSIS — Z90710 Acquired absence of both cervix and uterus: Secondary | ICD-10-CM

## 2022-01-25 DIAGNOSIS — Z1152 Encounter for screening for COVID-19: Secondary | ICD-10-CM | POA: Diagnosis not present

## 2022-01-25 DIAGNOSIS — I130 Hypertensive heart and chronic kidney disease with heart failure and stage 1 through stage 4 chronic kidney disease, or unspecified chronic kidney disease: Secondary | ICD-10-CM | POA: Diagnosis present

## 2022-01-25 DIAGNOSIS — I63532 Cerebral infarction due to unspecified occlusion or stenosis of left posterior cerebral artery: Principal | ICD-10-CM | POA: Diagnosis present

## 2022-01-25 DIAGNOSIS — E875 Hyperkalemia: Secondary | ICD-10-CM

## 2022-01-25 DIAGNOSIS — I5032 Chronic diastolic (congestive) heart failure: Secondary | ICD-10-CM | POA: Diagnosis present

## 2022-01-25 DIAGNOSIS — E7800 Pure hypercholesterolemia, unspecified: Secondary | ICD-10-CM | POA: Diagnosis present

## 2022-01-25 DIAGNOSIS — I248 Other forms of acute ischemic heart disease: Secondary | ICD-10-CM | POA: Diagnosis present

## 2022-01-25 DIAGNOSIS — Z885 Allergy status to narcotic agent status: Secondary | ICD-10-CM

## 2022-01-25 DIAGNOSIS — I10 Essential (primary) hypertension: Secondary | ICD-10-CM | POA: Diagnosis not present

## 2022-01-25 DIAGNOSIS — Z7982 Long term (current) use of aspirin: Secondary | ICD-10-CM

## 2022-01-25 DIAGNOSIS — Z298 Encounter for other specified prophylactic measures: Secondary | ICD-10-CM | POA: Diagnosis not present

## 2022-01-25 DIAGNOSIS — Z79899 Other long term (current) drug therapy: Secondary | ICD-10-CM

## 2022-01-25 DIAGNOSIS — E1122 Type 2 diabetes mellitus with diabetic chronic kidney disease: Secondary | ICD-10-CM | POA: Diagnosis present

## 2022-01-25 DIAGNOSIS — S82831A Other fracture of upper and lower end of right fibula, initial encounter for closed fracture: Secondary | ICD-10-CM | POA: Diagnosis present

## 2022-01-25 DIAGNOSIS — E669 Obesity, unspecified: Secondary | ICD-10-CM | POA: Diagnosis not present

## 2022-01-25 DIAGNOSIS — Z751 Person awaiting admission to adequate facility elsewhere: Secondary | ICD-10-CM

## 2022-01-25 DIAGNOSIS — Z66 Do not resuscitate: Secondary | ICD-10-CM | POA: Diagnosis not present

## 2022-01-25 DIAGNOSIS — N183 Chronic kidney disease, stage 3 unspecified: Secondary | ICD-10-CM | POA: Diagnosis not present

## 2022-01-25 DIAGNOSIS — R29711 NIHSS score 11: Secondary | ICD-10-CM | POA: Diagnosis present

## 2022-01-25 DIAGNOSIS — I69354 Hemiplegia and hemiparesis following cerebral infarction affecting left non-dominant side: Secondary | ICD-10-CM | POA: Diagnosis not present

## 2022-01-25 DIAGNOSIS — Z888 Allergy status to other drugs, medicaments and biological substances status: Secondary | ICD-10-CM

## 2022-01-25 DIAGNOSIS — W19XXXA Unspecified fall, initial encounter: Secondary | ICD-10-CM

## 2022-01-25 DIAGNOSIS — I6381 Other cerebral infarction due to occlusion or stenosis of small artery: Secondary | ICD-10-CM | POA: Diagnosis not present

## 2022-01-25 DIAGNOSIS — R7989 Other specified abnormal findings of blood chemistry: Secondary | ICD-10-CM | POA: Diagnosis present

## 2022-01-25 DIAGNOSIS — I161 Hypertensive emergency: Secondary | ICD-10-CM | POA: Diagnosis present

## 2022-01-25 DIAGNOSIS — E1129 Type 2 diabetes mellitus with other diabetic kidney complication: Secondary | ICD-10-CM | POA: Diagnosis present

## 2022-01-25 DIAGNOSIS — R4781 Slurred speech: Secondary | ICD-10-CM | POA: Diagnosis present

## 2022-01-25 DIAGNOSIS — Z880 Allergy status to penicillin: Secondary | ICD-10-CM

## 2022-01-25 LAB — CBC WITH DIFFERENTIAL/PLATELET
Abs Immature Granulocytes: 0.03 10*3/uL (ref 0.00–0.07)
Basophils Absolute: 0 10*3/uL (ref 0.0–0.1)
Basophils Relative: 0 %
Eosinophils Absolute: 0.1 10*3/uL (ref 0.0–0.5)
Eosinophils Relative: 1 %
HCT: 36.9 % (ref 36.0–46.0)
Hemoglobin: 12.2 g/dL (ref 12.0–15.0)
Immature Granulocytes: 0 %
Lymphocytes Relative: 21 %
Lymphs Abs: 1.6 10*3/uL (ref 0.7–4.0)
MCH: 28 pg (ref 26.0–34.0)
MCHC: 33.1 g/dL (ref 30.0–36.0)
MCV: 84.6 fL (ref 80.0–100.0)
Monocytes Absolute: 0.7 10*3/uL (ref 0.1–1.0)
Monocytes Relative: 9 %
Neutro Abs: 5.1 10*3/uL (ref 1.7–7.7)
Neutrophils Relative %: 69 %
Platelets: 244 10*3/uL (ref 150–400)
RBC: 4.36 MIL/uL (ref 3.87–5.11)
RDW: 13.8 % (ref 11.5–15.5)
WBC: 7.5 10*3/uL (ref 4.0–10.5)
nRBC: 0 % (ref 0.0–0.2)

## 2022-01-25 LAB — GLUCOSE, CAPILLARY: Glucose-Capillary: 384 mg/dL — ABNORMAL HIGH (ref 70–99)

## 2022-01-25 LAB — ECHOCARDIOGRAM COMPLETE
AR max vel: 2.56 cm2
AV Area VTI: 2.6 cm2
AV Area mean vel: 2.56 cm2
AV Mean grad: 3 mmHg
AV Peak grad: 5 mmHg
Ao pk vel: 1.12 m/s
Area-P 1/2: 3.91 cm2
Height: 62 in
MV VTI: 3.04 cm2
S' Lateral: 2.1 cm
Weight: 2892.44 oz

## 2022-01-25 LAB — URINALYSIS, COMPLETE (UACMP) WITH MICROSCOPIC
Bilirubin Urine: NEGATIVE
Glucose, UA: 150 mg/dL — AB
Ketones, ur: NEGATIVE mg/dL
Leukocytes,Ua: NEGATIVE
Nitrite: NEGATIVE
Protein, ur: 100 mg/dL — AB
Specific Gravity, Urine: 1.015 (ref 1.005–1.030)
Squamous Epithelial / HPF: >50 — ABNORMAL HIGH (ref 0–5)
pH: 6 (ref 5.0–8.0)

## 2022-01-25 LAB — COMPREHENSIVE METABOLIC PANEL
ALT: 14 U/L (ref 0–44)
AST: 27 U/L (ref 15–41)
Albumin: 3.3 g/dL — ABNORMAL LOW (ref 3.5–5.0)
Alkaline Phosphatase: 99 U/L (ref 38–126)
Anion gap: 9 (ref 5–15)
BUN: 27 mg/dL — ABNORMAL HIGH (ref 8–23)
CO2: 27 mmol/L (ref 22–32)
Calcium: 8.5 mg/dL — ABNORMAL LOW (ref 8.9–10.3)
Chloride: 96 mmol/L — ABNORMAL LOW (ref 98–111)
Creatinine, Ser: 1.71 mg/dL — ABNORMAL HIGH (ref 0.44–1.00)
GFR, Estimated: 33 mL/min — ABNORMAL LOW (ref >60)
Glucose, Bld: 261 mg/dL — ABNORMAL HIGH (ref 70–99)
Potassium: 4.2 mmol/L (ref 3.5–5.1)
Sodium: 132 mmol/L — ABNORMAL LOW (ref 135–145)
Total Bilirubin: 0.9 mg/dL (ref 0.3–1.2)
Total Protein: 8.6 g/dL — ABNORMAL HIGH (ref 6.5–8.1)

## 2022-01-25 LAB — URINE DRUG SCREEN, QUALITATIVE (ARMC ONLY)
Amphetamines, Ur Screen: NOT DETECTED
Barbiturates, Ur Screen: NOT DETECTED
Benzodiazepine, Ur Scrn: NOT DETECTED
Cannabinoid 50 Ng, Ur ~~LOC~~: NOT DETECTED
Cocaine Metabolite,Ur ~~LOC~~: NOT DETECTED
MDMA (Ecstasy)Ur Screen: NOT DETECTED
Methadone Scn, Ur: NOT DETECTED
Opiate, Ur Screen: NOT DETECTED
Phencyclidine (PCP) Ur S: NOT DETECTED
Tricyclic, Ur Screen: NOT DETECTED

## 2022-01-25 LAB — RESP PANEL BY RT-PCR (FLU A&B, COVID) ARPGX2
Influenza A by PCR: NEGATIVE
Influenza B by PCR: NEGATIVE
SARS Coronavirus 2 by RT PCR: NEGATIVE

## 2022-01-25 LAB — TROPONIN I (HIGH SENSITIVITY)
Troponin I (High Sensitivity): 62 ng/L — ABNORMAL HIGH (ref <18)
Troponin I (High Sensitivity): 50 ng/L — ABNORMAL HIGH (ref <18)
Troponin I (High Sensitivity): 44 ng/L — ABNORMAL HIGH (ref <18)

## 2022-01-25 LAB — ETHANOL: Alcohol, Ethyl (B): <10 mg/dL (ref <10)

## 2022-01-25 LAB — PROTIME-INR
INR: 1 (ref 0.8–1.2)
Prothrombin Time: 12.7 seconds (ref 11.4–15.2)

## 2022-01-25 LAB — CBG MONITORING, ED
Glucose-Capillary: 239 mg/dL — ABNORMAL HIGH (ref 70–99)
Glucose-Capillary: 196 mg/dL — ABNORMAL HIGH (ref 70–99)

## 2022-01-25 LAB — BRAIN NATRIURETIC PEPTIDE: B Natriuretic Peptide: 80.8 pg/mL (ref 0.0–100.0)

## 2022-01-25 LAB — APTT: aPTT: 24 seconds (ref 24–36)

## 2022-01-25 MED ORDER — ASPIRIN 81 MG PO CHEW
81.0000 mg | CHEWABLE_TABLET | Freq: Every morning | ORAL | Status: DC
  Administered 2022-01-26 – 2022-02-02 (×8): 81 mg via ORAL
  Filled 2022-01-25 (×8): qty 1

## 2022-01-25 MED ORDER — ROSUVASTATIN CALCIUM 10 MG PO TABS
20.0000 mg | ORAL_TABLET | Freq: Every day | ORAL | Status: DC
Start: 2022-01-25 — End: 2022-01-27
  Administered 2022-01-25 – 2022-01-26 (×2): 20 mg via ORAL
  Filled 2022-01-25 (×2): qty 2

## 2022-01-25 MED ORDER — GADOBUTROL 1 MMOL/ML IV SOLN
7.5000 mL | Freq: Once | INTRAVENOUS | Status: AC | PRN
  Administered 2022-01-25: 7.5 mL via INTRAVENOUS

## 2022-01-25 MED ORDER — LEVETIRACETAM 250 MG PO TABS
250.0000 mg | ORAL_TABLET | Freq: Two times a day (BID) | ORAL | Status: DC
  Administered 2022-01-25 – 2022-02-02 (×16): 250 mg via ORAL
  Filled 2022-01-25 (×16): qty 1

## 2022-01-25 MED ORDER — ACETAMINOPHEN 160 MG/5ML PO SOLN: 650.0000 mg | ORAL | Status: DC | PRN

## 2022-01-25 MED ORDER — PREGABALIN 75 MG PO CAPS
75.0000 mg | ORAL_CAPSULE | Freq: Two times a day (BID) | ORAL | Status: DC
  Administered 2022-01-25 – 2022-02-02 (×16): 75 mg via ORAL
  Filled 2022-01-25 (×16): qty 1

## 2022-01-25 MED ORDER — ENALAPRILAT 1.25 MG/ML IV SOLN: 0.6250 mg | INTRAVENOUS | Status: DC | PRN

## 2022-01-25 MED ORDER — LEVETIRACETAM 250 MG PO TABS: 250.0000 mg | ORAL_TABLET | Freq: Two times a day (BID) | ORAL | Status: DC

## 2022-01-25 MED ORDER — ONDANSETRON HCL 4 MG/2ML IJ SOLN
4.0000 mg | Freq: Three times a day (TID) | INTRAMUSCULAR | Status: DC | PRN
Start: 2022-01-25 — End: 2022-02-02

## 2022-01-25 MED ORDER — INSULIN ASPART 100 UNIT/ML IJ SOLN
0.0000 [IU] | Freq: Three times a day (TID) | INTRAMUSCULAR | Status: DC
  Administered 2022-01-25: 5 [IU] via SUBCUTANEOUS
  Administered 2022-01-25: 2 [IU] via SUBCUTANEOUS
  Administered 2022-01-25: 3 [IU] via SUBCUTANEOUS
  Administered 2022-01-26: 2 [IU] via SUBCUTANEOUS
  Administered 2022-01-26: 7 [IU] via SUBCUTANEOUS
  Administered 2022-01-26: 1 [IU] via SUBCUTANEOUS
  Administered 2022-01-28: 5 [IU] via SUBCUTANEOUS
  Administered 2022-01-28 (×2): 2 [IU] via SUBCUTANEOUS
  Administered 2022-01-29: 7 [IU] via SUBCUTANEOUS
  Administered 2022-01-29 – 2022-01-30 (×3): 5 [IU] via SUBCUTANEOUS
  Administered 2022-01-30: 3 [IU] via SUBCUTANEOUS
  Administered 2022-01-30: 7 [IU] via SUBCUTANEOUS
  Administered 2022-01-31: 2 [IU] via SUBCUTANEOUS
  Administered 2022-01-31: 3 [IU] via SUBCUTANEOUS
  Administered 2022-02-01: 2 [IU] via SUBCUTANEOUS
  Administered 2022-02-01: 3 [IU] via SUBCUTANEOUS
  Administered 2022-02-02: 1 [IU] via SUBCUTANEOUS
  Administered 2022-02-02: 2 [IU] via SUBCUTANEOUS
  Filled 2022-01-25 (×21): qty 1

## 2022-01-25 MED ORDER — ACETAMINOPHEN 500 MG PO TABS
1000.0000 mg | ORAL_TABLET | Freq: Once | ORAL | Status: DC
Start: 2022-01-25 — End: 2022-01-27
  Filled 2022-01-25: qty 2

## 2022-01-25 MED ORDER — NICARDIPINE HCL IN NACL 20-0.86 MG/200ML-% IV SOLN
3.0000 mg/h | INTRAVENOUS | Status: DC
  Administered 2022-01-25: 5 mg/h via INTRAVENOUS
  Filled 2022-01-25: qty 200

## 2022-01-25 MED ORDER — CLOPIDOGREL BISULFATE 75 MG PO TABS
300.0000 mg | ORAL_TABLET | Freq: Once | ORAL | Status: AC
Start: 2022-01-25 — End: 2022-01-25
  Administered 2022-01-25: 300 mg via ORAL
  Filled 2022-01-25: qty 4

## 2022-01-25 MED ORDER — INSULIN GLARGINE-YFGN 100 UNIT/ML ~~LOC~~ SOLN
9.0000 [IU] | Freq: Every day | SUBCUTANEOUS | Status: DC
Start: 2022-01-25 — End: 2022-01-26
  Administered 2022-01-25: 9 [IU] via SUBCUTANEOUS
  Filled 2022-01-25 (×2): qty 0.09

## 2022-01-25 MED ORDER — CLOPIDOGREL BISULFATE 75 MG PO TABS
75.0000 mg | ORAL_TABLET | Freq: Every day | ORAL | Status: DC
Start: 2022-01-26 — End: 2022-02-02
  Administered 2022-01-26 – 2022-02-02 (×8): 75 mg via ORAL
  Filled 2022-01-25 (×8): qty 1

## 2022-01-25 MED ORDER — ACETAMINOPHEN 325 MG PO TABS
650.0000 mg | ORAL_TABLET | ORAL | Status: DC | PRN
  Filled 2022-01-25: qty 2

## 2022-01-25 MED ORDER — SENNOSIDES-DOCUSATE SODIUM 8.6-50 MG PO TABS
1.0000 | ORAL_TABLET | Freq: Every evening | ORAL | Status: DC | PRN
  Administered 2022-01-26: 1 via ORAL
  Filled 2022-01-25: qty 1

## 2022-01-25 MED ORDER — ENOXAPARIN SODIUM 40 MG/0.4ML IJ SOSY
40.0000 mg | PREFILLED_SYRINGE | INTRAMUSCULAR | Status: DC
  Administered 2022-01-25 – 2022-01-29 (×5): 40 mg via SUBCUTANEOUS
  Filled 2022-01-25 (×5): qty 0.4

## 2022-01-25 MED ORDER — INSULIN ASPART 100 UNIT/ML IJ SOLN
0.0000 [IU] | Freq: Every day | INTRAMUSCULAR | Status: DC
  Administered 2022-01-25: 5 [IU] via SUBCUTANEOUS
  Administered 2022-01-26: 3 [IU] via SUBCUTANEOUS
  Administered 2022-01-27: 2 [IU] via SUBCUTANEOUS
  Administered 2022-01-28: 4 [IU] via SUBCUTANEOUS
  Filled 2022-01-25 (×4): qty 1

## 2022-01-25 MED ORDER — ACETAMINOPHEN 650 MG RE SUPP: 650.0000 mg | RECTAL | Status: DC | PRN

## 2022-01-25 MED ORDER — STROKE: EARLY STAGES OF RECOVERY BOOK: Freq: Once | Status: AC

## 2022-01-25 NOTE — Consult Note (Signed)
[facility] ?[facility] Tele[facility] ? ? ?Patient Name:   [name] Gilbert, [name] Gilbert ?Date of Birth:   28-Sep-1960 ?Date of Service:   01/25/2022 06:58:49 ? ?Diagnosis: ?      I63.9 - Cerebrovascular accident (CVA), unspecified mechanism ([place]) ? ?Impression: ?     61 yo woman presenting for weakness and falls. 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 Unfortunately with two days of symptoms, she is outside the window for thrombolytics and thrombectomy. Recommend admission for further workup. Suspect underlying afib, may benefit from an MRI to look for other infarcts in other territories. For now, place on aspirin. Please have neurology continue to follow. ? ?Our recommendations are outlined below. ? ?Recommendations: ? ?      Stroke/Telemetry Floor ?      Neuro Checks ?      Bedside Swallow Eval ?      DVT Prophylaxis ?      IV Fluids, Normal Saline ?      Head of Bed 30 Degrees ?      Euglycemia and Avoid Hyperthermia (PRN Acetaminophen) ?      Initiate or continue Aspirin 325 MG daily ? ?Per facility request will defer further work up, management, and referrals to inpatient service, inclusive of inpatient neurology consult. ? ?Sign Out: ?      Discussed with Emergency Department Provider ? ? ? ?------------------------------------------------------------------------------ ? ?Advanced Imaging: ?Advanced Imaging Deferred because: ? ?Does not meet criteria due to being out of the 24-hour window for thrombectomy ? ? ?Metrics: ?Last Known Well: Unknown ?[facility] Notification Time: 01/25/2022 06:58:49 ?Arrival Time: 01/25/2022 06:03:00 ?Stamp Time: 01/25/2022 06:58:49 ?Initial Response Time: 01/25/2022 07:03:51 ?Symptoms: weakness, falls. ?Initial patient interaction: 01/25/2022 07:04:51 ?NIHSS Assessment Completed: 01/25/2022 07:06:29 ?Patient is not a candidate for Thrombolytic. ?Thrombolytic Medical Decision: 01/25/2022 07:09:00 ?Patient was not deemed candidate for Thrombolytic because of  following reasons: ?Last Well Known Above 4.5 Hours. ? ?I personally Reviewed the CT Head ? ?Primary Provider Notified of Diagnostic Impression and Management Plan on: 01/25/2022 07:31:44 ? ? ? ?------------------------------------------------------------------------------ ? ?History of Present Illness: ?Patient is a 61 year old Female. ? ?Patient was brought by EMS for symptoms of weakness, falls. ?This is a 61 yo woman with history of HTN, DM, and prior strokes. She presents for multiple falls over the last two days, including this morning. Unclear if she hit her head or lost consciousness. She seems confused and is not able to tell me what happened. She speaks quite softly and sparsely. ? ?Per report, she has baseline left sided weakness but is feeling weaker than her baseline. Head CT was done before my evaluation and showed a new acute/subacute left temporal lobe infarct. ? ?She says she does not take any blood thinners though aspirin is listed in her chart. She also seems to have a history of seizures (keppra listed as a medicine). ? ?two days of weakness ?fell four times in the last two days ?history of stroke, left sided weakness at baseline ?  ? ?Past Medical History: ?     Hypertension ?     Stroke ?     Seizures ? ?Medications: ? ?No Anticoagulant use  ?Antiplatelet use: Unknown ?Reviewed EMR for current medications ? ?Allergies:  ?Reviewed ? ?Social History: ?Smoking: No ? ?Family History: ? ?Family History Cannot Be Obtained Because:Patient Is Confused ? ?ROS : ROS Cannot Be Obtained Because:  Patient Is Confused ? ?Past Surgical History: ?Past Surgical History Cannot Be Obtained Because: Patient Is Confused ? ?  ? ?  Examination: ?BP(199/104), Pulse(101), ?1A: Level of Consciousness - Alert; keenly responsive + 0 ?1B: Ask Month and Age - 1 Question Right + 1 ?1C: Blink Eyes & Squeeze Hands - Performs Both Tasks + 0 ?2: Test Horizontal Extraocular Movements - Normal + 0 ?3: Test Visual Fields - No Visual  Loss + 0 ?4: Test Facial Palsy (Use Grimace if Obtunded) - Normal symmetry + 0 ?5A: Test Left Arm Motor Drift - Drift, hits bed + 2 ?5B: Test Right Arm Motor Drift - Drift, but doesn't hit bed + 1 ?6A: Test Left Leg Motor Drift - Some Effort Against Gravity + 2 ?6B: Test Right Leg Motor Drift - Some Effort Against Gravity + 2 ?7: Test Limb Ataxia (FNF/Heel-Shin) - No Ataxia + 0 ?8: Test Sensation - Normal; No sensory loss + 0 ?9: Test Language/Aphasia - Severe Aphasia: Fragmentary Expression, Inference Needed, Cannot Identify Materials + 2 ?10: Test Dysarthria - Mild-Moderate Dysarthria: Slurring but can be understood + 1 ?11: Test Extinction/Inattention - No abnormality + 0 ? ?NIHSS Score: 11 ? ? ?Pre-Morbid Modified Rankin Scale: ?Unable to assess ? ? ?Patient/Family was informed the Neurology Consult would occur via TeleHealth consult by way of interactive audio and video telecommunications and consented to receiving care in this manner. ? ? ?Patient is being evaluated for possible acute neurologic impairment and high probability of imminent or life-threatening deterioration. I spent total of 30 minutes providing care to this patient, including time for face to face visit via telemedicine, review of medical records, imaging studies and discussion of findings with providers, the patient and/or family. ? ? ?Dr Precious Gilding ? ? ?[facility] ?[phone] ? ? ?Case PZ:[id number] ?  ?

## 2022-01-25 NOTE — Evaluation (Signed)
Occupational Therapy Evaluation ?Patient Details ?Name: [name] Gilbert ?MRN: [id number] ?DOB: Dec 20, 1960 ?Today's Date: 01/25/2022 ? ? ?History of Present Illness This is a 61 yo woman with history of HTN, DM, and prior strokes. She presents for multiple falls over the last two days, including this morning.  Head CT showed a new acute/subacute left temporal lobe infarct.  ? ?Clinical Impression ?  ?Ms Threat was seen for OT evaluation this date. Prior to hospital admission, pt was Independent for mobility and ADLs. Pt lives with spouse in home c 2 STE. Pt presents to acute OT demonstrating impaired ADL performance and functional mobility 2/2 decreased activity tolerance and functional strength/ROM/balance deficits. Pt has flat affect and limited by BLE pain (R worse than L) pt does not quantify or specify. ? ?Pt currently requires MAX A don B socks at bed level. MIN A bed mobility. MIN A + HHA sit<>stand - B knee buckling, tolerates 1 side step before sitting. Pt would benefit from skilled OT to address noted impairments and functional limitations (see below for any additional details). Upon hospital discharge, recommend STR, anticipate improving to AIR if pt demonstrates increased tolerance next session.   ? ?Recommendations for follow up therapy are one component of a multi-disciplinary discharge planning process, led by the attending physician.  Recommendations may be updated based on patient status, additional functional criteria and insurance authorization.  ? ?Follow Up Recommendations ? Skilled nursing-short term rehab (<3 hours/day) (may improve to AIR with tolerance)  ?  ?Assistance Recommended at Discharge Frequent or constant Supervision/Assistance  ?Patient can return home with the following A lot of help with walking and/or transfers;A lot of help with bathing/dressing/bathroom;Help with stairs or ramp for entrance ? ?  ?Functional Status Assessment ? Patient has had a recent decline in their functional  status and demonstrates the ability to make significant improvements in function in a reasonable and predictable amount of time.  ?Equipment Recommendations ? BSC/3in1  ?  ?Recommendations for Other Services   ? ? ?  ?Precautions / Restrictions Precautions ?Precautions: Fall ?Restrictions ?Weight Bearing Restrictions: No  ? ?  ? ?Mobility Bed Mobility ?Overal bed mobility: Needs Assistance ?Bed Mobility: Supine to Sit, Sit to Supine ?  ?  ?Supine to sit: Min assist, HOB elevated ?Sit to supine: Min guard, HOB elevated ?  ?  ?  ? ?Transfers ?Overall transfer level: Needs assistance ?Equipment used: None ?Transfers: Sit to/from Stand ?Sit to Stand: Min assist, From elevated surface ?  ?  ?  ?  ?  ?General transfer comment: B knees buckling ?  ? ?  ?Balance Overall balance assessment: Needs assistance ?Sitting-balance support: No upper extremity supported, Feet supported ?Sitting balance-Leahy Scale: Fair ?  ?  ?Standing balance support: No upper extremity supported ?Standing balance-Leahy Scale: Poor ?  ?  ?  ?  ?  ?  ?  ?  ?  ?  ?  ?  ?   ? ?ADL either performed or assessed with clinical judgement  ? ?ADL Overall ADL's : Needs assistance/impaired ?  ?  ?  ?  ?  ?  ?  ?  ?  ?  ?  ?  ?  ?  ?  ?  ?  ?  ?  ?General ADL Comments: MAX A don B socks at bed level. MIN A + HHA for ADL t/f - B knee buckling.  ? ? ? ? ?Pertinent Vitals/Pain Pain Assessment ?Pain Assessment: Faces ?Faces Pain Scale: Hurts even more ?  Pain Location: BLE with ROM ?Pain Descriptors / Indicators: Dull, Grimacing, Guarding ?Pain Intervention(s): Repositioned, Limited activity within patient's tolerance  ? ? ? ?Hand Dominance Right ?  ?Extremity/Trunk Assessment Upper Extremity Assessment ?Upper Extremity Assessment: Generalized weakness ?  ?Lower Extremity Assessment ?Lower Extremity Assessment: Generalized weakness ?  ?  ?  ?Communication Communication ?Communication: Expressive difficulties (stutter at baseline\) ?  ?Cognition Arousal/Alertness:  Awake/alert ?Behavior During Therapy: Flat affect ?Overall Cognitive Status: Difficult to assess ?  ?  ?  ?  ?  ?  ?  ?  ?  ?  ?  ?  ?  ?  ?  ?  ?General Comments: baseline stutter, self-limits, does not answer all questions  ?  ?  ? ?Home Living Family/patient expects to be discharged to:: Private residence ?Living Arrangements: Spouse/significant other ?Available Help at Discharge: Family;Available PRN/intermittently ?Type of Home: House ?Home Access: Stairs to enter ?Entrance Stairs-Number of Steps: 2 ?Entrance Stairs-Rails: None ?Home Layout: One level ?  ?  ?  ?  ?  ?  ?  ?  ?  ?  ?  ? ?  ?Prior Functioning/Environment Prior Level of Function : Independent/Modified Independent ?  ?  ?  ?  ?  ?  ?  ?  ?  ? ?  ?  ?OT Problem List: Decreased strength;Decreased activity tolerance;Decreased range of motion;Impaired balance (sitting and/or standing);Decreased safety awareness;Impaired sensation ?  ?   ?OT Treatment/Interventions: Self-care/ADL training;Therapeutic exercise;Energy conservation;DME and/or AE instruction;Therapeutic activities;Patient/family education;Balance training  ?  ?OT Goals(Current goals can be found in the care plan section) Acute Rehab OT Goals ?Patient Stated Goal: to return to PLOF ?OT Goal Formulation: With patient/family ?Time For Goal Achievement: 02/08/22 ?Potential to Achieve Goals: Good ?ADL Goals ?Pt Will Perform Grooming: with modified independence;standing ?Pt Will Perform Lower Body Dressing: with supervision;sit to/from stand ?Pt Will Transfer to Toilet: with modified independence;ambulating;regular height toilet  ?OT Frequency: Min 3X/week ?  ? ?Co-evaluation   ?  ?  ?  ?  ? ?  ?AM-PAC OT "6 Clicks" Daily Activity     ?Outcome Measure Help from another person eating meals?: A Little ?Help from another person taking care of personal grooming?: A Little ?Help from another person toileting, which includes using toliet, bedpan, or urinal?: A Lot ?Help from another person bathing  (including washing, rinsing, drying)?: A Lot ?Help from another person to put on and taking off regular upper body clothing?: A Little ?Help from another person to put on and taking off regular lower body clothing?: A Lot ?6 Click Score: 15 ?  ?End of Session Nurse Communication: Mobility status ? ?Activity Tolerance: Patient tolerated treatment well;Patient limited by pain ?Patient left: in bed;with call bell/phone within reach;with family/visitor present ? ?OT Visit Diagnosis: Muscle weakness (generalized) (M62.81);Other symptoms and signs involving cognitive function  ?              ?Time: [id number] ?OT Time Calculation (min): 13 min ?Charges:  OT General Charges ?$OT Visit: 1 Visit ?OT Evaluation ?$OT Eval Moderate Complexity: 1 Mod ? ?Dessie Coma, M.S. OTR/L  ?01/25/22, 2:44 PM  ?ascom [phone] ? ?

## 2022-01-25 NOTE — H&P (Signed)
?History and Physical  ? ? [name] Gilbert?[name] Gilbert [id number] DOB: 17-Jun-1961 DOA: 01/25/2022 ? ?Referring MD/NP/PA:  ? ?PCP: [name] GrimMcCravy, Matthew S, MD  ? ?Patient coming from:  The patient is coming from home.  At baseline, pt is independent for most of ADL.       ? ?Chief Complaint: weakness and fall ? ?HPI: [name] GrandchildJeannette Gilbert is a 61 y.o. 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 ? ?Pt states that she had history of 3 strokes in the past.  She has chronic left-sided weakness.  She states that she has been feeling weaker in the past 2 days.  She feels like her left-sided weakness has worsened. She fell in the early morning. No LOC per her husband.  Denies headache or neck pain.  She has mild left hip pain.  Patient does not have facial droop or slurred speech.  Denies chest pain, cough, shortness breath, no fever or chills.  Denies nausea vomiting, diarrhea or abdominal pain. Denies symptoms of UTI.  She states that her blood pressure has been poorly controlled recently.  Her blood pressure is at 204/98 in ED. Per chart review, patient was started on Keppra since last stroke.  She denies history of seizure. ? ?Data Reviewed and ED Course: pt was found to have WBC 7.5, INR 1.0, PTT 24, troponin level 24, urinalysis has squamous cell contamination, negative COVID PCR, renal function close to baseline, temperature normal, heart rate 104, RR 16, oxygen saturation 100% on room air.  Chest x-ray negative. Pending CT-C spin. Pt is admitted to PCU as inpt. Dr. Amada JupiterKirkpatrick of neurology is consulted ? ?CT-head: ?1. Relatively large acute to subacute infarct of the posterior and mesial Left temporal lobe, new since February. ?2. No hemorrhagic transformation or significant mass effect. ?3. Otherwise stable advanced underlying chronic ischemic disease. ?4. No  acute traumatic injury identified. ? ? ?EKG: I have personally reviewed.  Sinus rhythm, QTc 487, LAE, T wave inversion in the inferior leads, mild ST depression in V5-V6. ? ? ?Review of Systems:  ? ?General: no fevers, chills, no body weight gain, has fatigue ?HEENT: has poor vision, no hearing changes or sore throat ?Respiratory: no dyspnea, coughing, wheezing ?CV: no chest pain, no palpitations ?GI: no nausea, vomiting, abdominal pain, diarrhea, constipation ?GU: no dysuria, burning on urination, increased urinary frequency, hematuria  ?Ext: no leg edema ?Neuro: no hearing loss. Has fall, left sided weakness and poor vision. ?Skin: no rash, no skin tear. ?MSK: No muscle spasm, no deformity, no limitation of range of movement in spin. Has left hip pain ?Heme: No easy bruising.  ?Travel history: No recent long distant travel. ? ? ?Allergy:  ?Allergies  ?Allergen Reactions  ? Metformin Other (See Comments)  ?  Reaction: unknown  ? Penicillins Hives, Nausea And Vomiting and Swelling  ?  Has patient had a PCN reaction causing immediate rash, facial/tongue/throat swelling, SOB or lightheadedness with hypotension: Yes ?Has patient had a PCN reaction causing severe rash involving mucus membranes or skin necrosis: No ?Has patient had a PCN reaction that required hospitalization Yes ?Has patient had a PCN reaction occurring within the last 10 years: No ?If all of the above answers are "NO", then may proceed with Cephalosporin use. ?  ? Codeine Other (See Comments)  ?  Reaction: unknown  ? Hydralazine   ?  Stomach pain  ? Statins Other (See  Comments)  ?  Myopathy with atorva 80  ? ? ?Past Medical History:  ?Diagnosis Date  ? Diabetes mellitus without complication (HCC)   ? Hypertension   ? Stroke [facility])   ? visual impaired   ? ? ?Past Surgical History:  ?Procedure Laterality Date  ? ABDOMINAL HYSTERECTOMY    ? TONSILLECTOMY    ? ? ?Social History:  reports that she has never smoked. She has never used smokeless tobacco. She  reports that she does not drink alcohol and does not use drugs. ? ?Family History:  ?Family History  ?Problem Relation Age of Onset  ? Diabetes Father   ?  ? ?Prior to Admission medications   ?Medication Sig Start Date End Date Taking? Authorizing Provider  ?acetaminophen (TYLENOL) 500 MG tablet Take 1,000 mg by mouth daily as needed for pain. 05/27/20   [provider]  ?amLODipine (NORVASC) 10 MG tablet Take 10 mg by mouth daily.    [provider]  ?aspirin 81 MG chewable tablet Chew 81 mg by mouth every morning. 03/02/21   [provider]  ?carvedilol (COREG) 12.5 MG tablet Take 25 mg by mouth 2 (two) times daily. 06/10/21   [provider]  ?hydrALAZINE (APRESOLINE) 25 MG tablet Take 25 mg by mouth 3 (three) times daily. 04/13/21   [provider]  ?insulin aspart (NOVOLOG FLEXPEN) 100 UNIT/ML FlexPen Inject 2 Units into the skin 3 (three) times daily with meals. 11/01/18   Alford Highland, MD  ?insulin degludec (TRESIBA FLEXTOUCH) 100 UNIT/ML SOPN FlexTouch Pen Inject 30 Units into the skin daily. 08/11/19   [provider]  ?insulin lispro (HUMALOG) 100 UNIT/ML injection Inject 0-30 Units into the skin daily.    [provider]  ?levETIRAcetam (KEPPRA) 250 MG tablet Take 1 tablet (250 mg total) by mouth 2 (two) times daily. 06/15/21 07/15/21  Tresa Moore, MD  ?pregabalin (LYRICA) 75 MG capsule Take 75 mg by mouth 2 (two) times daily. 03/08/21   [provider]  ?rosuvastatin (CRESTOR) 20 MG tablet Take 20 mg by mouth at bedtime. 05/18/21   [provider]  ?spironolactone (ALDACTONE) 50 MG tablet Take 150 mg by mouth daily. 05/13/21   [provider]  ? ? ?Physical Exam: ?Vitals:  ? 01/25/22 1430 01/25/22 1500 01/25/22 1530 01/25/22 1730  ?BP: (!) 132/93 140/68 131/60 140/75  ?Pulse: 88 88 85 88  ?Resp: 18 (!) 21 16 15   ?Temp:      ?TempSrc:      ?SpO2: 100% 100% 99% 100%  ?Weight:      ?Height:      ? ?General: Not in acute  distress ?HEENT: ?      Eyes: No scleral icterus. Has poor vision in both eyes ?      ENT: No discharge from the ears and nose, no pharynx injection, no tonsillar enlargement.  ?      Neck: No JVD, no bruit, no mass felt. ?Heme: No neck lymph node enlargement. ?Cardiac: S1/S2, RRR, No murmurs, No gallops or rubs. ?Respiratory: No rales, wheezing, rhonchi or rubs. ?GI: Soft, nondistended, nontender, no rebound pain, no organomegaly, BS present. ?GU: No hematuria ?Ext: No pitting leg edema bilaterally. 1+DP/PT pulse bilaterally. ?Musculoskeletal: No joint deformities, No joint redness or warmth, no limitation of ROM in spin. ?Skin: No rashes.  ?Neuro: Alert, oriented X3, cranial nerves II-XII grossly intact, Muscle strength 2/5 in left arm and leg, and 5/5 in right extremities, sensation to light touch intact.  ?Psych:  Patient is not psychotic, no suicidal or hemocidal ideation. ? ?Labs on Admission: I have personally reviewed following labs and imaging studies ? ?CBC: ?Recent Labs  ?Lab 01/25/22 ?0611  ?WBC 7.5  ?NEUTROABS 5.1  ?HGB 12.2  ?HCT 36.9  ?MCV 84.6  ?PLT 244  ? ?Basic Metabolic Panel: ?Recent Labs  ?Lab 01/25/22 ?0611  ?NA 132*  ?K 4.2  ?CL 96*  ?CO2 27  ?GLUCOSE 261*  ?BUN 27*  ?CREATININE 1.71*  ?CALCIUM 8.5*  ? ?GFR: ?Estimated Creatinine Clearance: 33.9 mL/min (A) (by C-G formula based on SCr of 1.71 mg/dL (H)). ?Liver Function Tests: ?Recent Labs  ?Lab 01/25/22 ?0611  ?AST 27  ?ALT 14  ?ALKPHOS 99  ?BILITOT 0.9  ?PROT 8.6*  ?ALBUMIN 3.3*  ? ?No results for input(Gilbert): LIPASE, AMYLASE in the last 168 hours. ?No results for input(Gilbert): AMMONIA in the last 168 hours. ?Coagulation Profile: ?Recent Labs  ?Lab 01/25/22 ?0611  ?INR 1.0  ? ?Cardiac Enzymes: ?No results for input(Gilbert): CKTOTAL, CKMB, CKMBINDEX, TROPONINI in the last 168 hours. ?BNP (last 3 results) ?No results for input(Gilbert): PROBNP in the last 8760 hours. ?HbA1C: ?No results for input(Gilbert): HGBA1C in the last 72 hours. ?CBG: ?Recent Labs  ?Lab  01/25/22 ?1150 01/25/22 ?1650  ?GLUCAP 239* 196*  ? ?Lipid Profile: ?No results for input(Gilbert): CHOL, HDL, LDLCALC, TRIG, CHOLHDL, LDLDIRECT in the last 72 hours. ?Thyroid Function Tests: ?No results for input(Gilbert

## 2022-01-25 NOTE — ED Provider Notes (Addendum)
? ?[facility] ?Provider Note ? ? ? Event Date/Time  ? First MD Initiated Contact with Patient 01/25/22 [id number]   ?  (approximate) ? ? ?History  ? ?Fall ? ? ?HPI ? ?[name] Gilbert is a 61 y.o. female  with a history of DM1, CVA with L sided deficits, HTN, CKD who presents for evaluation of a fall.  Patient does not remember falling.  She was found by her husband on the floor.  She is not sure what happened this evening.  She does report over the last 2 days that she has not been feeling well.  She has had worsening weakness of the left side of her body.  Since the fall she is complaining of left hip pain.  She is not sure if she hit her head.  She denies headache or neck pain, cough or congestion, chest pain or shortness of breath, abdominal pain, vomiting or diarrhea, fever or chills, dysuria or hematuria.  Per EMS patient fell 1 hour ago at home.  Patient hypertensive with BP of 204/98.  Has not taken her morning meds yet.  CBG normal at 201. ?  ? ? ?Past Medical History:  ?Diagnosis Date  ? Diabetes mellitus without complication (HCC)   ? Hypertension   ? Stroke [facility])   ? visual impaired   ? ? ?Past Surgical History:  ?Procedure Laterality Date  ? ABDOMINAL HYSTERECTOMY    ? TONSILLECTOMY    ? ? ? ?Physical Exam  ? ?Triage Vital Signs: ?ED Triage Vitals stroke  ?Enc Vitals Group  ?   BP (!) 199/104  ?   Pulse Rate (!) 104  ?   Resp 16  ?   Temp 98.5 ?F (36.9 ?C)  ?   Temp Source Oral  ?   SpO2 100 %  ?   Weight 180 lb 12.4 oz (82 kg)  ?   Height 5\' 2"  (1.575 m)  ?   Head Circumference   ?   Peak Flow   ?   Pain Score 8  ?   Pain Loc   ?   Pain Edu?   ?   Excl. in GC?   ? ? ?Most recent vital signs: ?Vitals:  ? 01/25/22 0609  ?BP: (!) 199/104  ?Pulse: (!) 104  ?Resp: 16  ?Temp: 98.5 ?F (36.9 ?C)  ?SpO2: 100%  ? ? ?Full spinal precautions maintained throughout the trauma exam. ?Constitutional: Alert and oriented to self and place. Seems confused and slow to answer questions. No acute distress.  Does not appear intoxicated. ?HEENT ?Head: Normocephalic and atraumatic. ?Face: No facial bony tenderness. Stable midface ?Ears: No hemotympanum bilaterally. No Battle sign ?Eyes: No eye injury. PERRL. No raccoon eyes ?Nose: Nontender. No epistaxis. No rhinorrhea ?Mouth/Throat: Mucous membranes are moist. No oropharyngeal blood. No dental injury. Airway patent without stridor. Normal voice. ?Neck: no C-collar. No midline c-spine tenderness.  ?Cardiovascular: Normal rate, regular rhythm. Normal and symmetric distal pulses are present in all extremities. ?Pulmonary/Chest: Chest wall is stable and nontender to palpation/compression. Normal respiratory effort. Breath sounds are normal. No crepitus.  ?Abdominal: Soft, nontender, non distended. ?Musculoskeletal: TTP over the L proximal femur. Nontender with normal full range of motion in all extremities. No deformities. No thoracic or lumbar midline spinal tenderness. Pelvis is stable. ?Skin: Skin is warm, dry and intact. No abrasions or contutions. ?Psychiatric: Speech and behavior are appropriate. ?Neurological: Normal speech and language. RUQ 5/5, b/l LE 3/5, LUE 4/5, no facial droop ? ? ?  Glascow Coma Score: ?4 - Opens eyes on own ?6 - Follows simple motor commands ?4 - Seems confused, disoriented ?GCS: 14 ? ? ?ED Results / Procedures / Treatments  ? ?Labs ?(all labs ordered are listed, but only abnormal results are displayed) ?Labs Reviewed  ?COMPREHENSIVE METABOLIC PANEL - Abnormal; Notable for the following components:  ?    Result Value  ? Sodium 132 (*)   ? Chloride 96 (*)   ? Glucose, Bld 261 (*)   ? BUN 27 (*)   ? Creatinine, Ser 1.71 (*)   ? Calcium 8.5 (*)   ? Total Protein 8.6 (*)   ? Albumin 3.3 (*)   ? GFR, Estimated 33 (*)   ? All other components within normal limits  ?TROPONIN I (HIGH SENSITIVITY) - Abnormal; Notable for the following components:  ? Troponin I (High Sensitivity) 62 (*)   ? All other components within normal limits  ?RESP PANEL BY RT-PCR  (FLU A&B, COVID) ARPGX2  ?CBC WITH DIFFERENTIAL/PLATELET  ?URINALYSIS, COMPLETE (UACMP) WITH MICROSCOPIC  ?PROTIME-INR  ?APTT  ?URINE DRUG SCREEN, QUALITATIVE ([facility] ONLY)  ?ETHANOL  ? ? ? ?EKG ? ?ED ECG REPORT ?I, Nita Sickle, the attending physician, personally viewed and interpreted this ECG. ? ?Sinus rhythm with a rate of 79, normal intervals, normal axis, no ST elevations or depressions ? ?RADIOLOGY ?I, Nita Sickle, attending MD, have personally viewed and interpreted the images obtained during this visit as below: ? ?CT showing developing acute/ subacute stroke ? ? ?___________________________________________________ ?Interpretation by Radiologist:  ?CT Head Wo Contrast ? ?Result Date: 01/25/2022 ?CLINICAL DATA:  61 year old female status post fall with pain. Hypertensive, 204 systolic. Hyperglycemia. EXAM: CT HEAD WITHOUT CONTRAST TECHNIQUE: Contiguous axial images were obtained from the base of the skull through the vertex without intravenous contrast. RADIATION DOSE REDUCTION: This exam was performed according to the departmental dose-optimization program which includes automated exposure control, adjustment of the mA and/or kV according to patient size and/or use of iterative reconstruction technique. COMPARISON:  Head CT 11/01/2021 and earlier. FINDINGS: Brain: Advanced chronic ischemic disease. Multifocal cerebral encephalomalacia, maximal in the left medial parietal and occipital lobes. Fairly extensive superimposed chronic small vessel disease in the deep gray nuclei and brainstem. However, new broad-based area of cytotoxic edema in the left posterior and mesial temporal lobe (coronal image 39 versus series 4, image 40 in February). This could be acute or subacute. No hemorrhagic transformation or mass effect. Elsewhere gray-white matter differentiation is stable. No midline shift, mass effect, or evidence of intracranial mass lesion. No ventriculomegaly. Normal basilar cisterns. Vascular:  Calcified atherosclerosis at the skull base. No suspicious intracranial vascular hyperdensity. Skull: Stable.  No acute osseous abnormality identified. Sinuses/Orbits: Visualized paranasal sinuses and mastoids are clear. Other: No acute orbit or scalp soft tissue injury. IMPRESSION: 1. Relatively large acute to subacute infarct of the posterior and mesial Left temporal lobe, new since February. 2. No hemorrhagic transformation or significant mass effect. 3. Otherwise stable advanced underlying chronic ischemic disease. 4. No acute traumatic injury identified. Electronically Signed   By: Odessa Fleming M.D.   On: 01/25/2022 06:38  ? ?DG Chest Portable 1 View ? ?Result Date: 01/25/2022 ?CLINICAL DATA:  BIB [facility] from home due to fall X 1 hour ago, new left lower back pain and generalized weakness (for last few days). Baseline left sided deficits from previous stroke. Hypertensive, 204/98. CBG 201 Unable to tell RN what caused fall. EXAM: PORTABLE CHEST - 1 VIEW COMPARISON:  10/02/2019 FINDINGS: Lungs  are clear. Heart size and mediastinal contours are within normal limits. No effusion.  No pneumothorax. Visualized bones unremarkable. IMPRESSION: No acute cardiopulmonary disease. Electronically Signed   By: Corlis Leak  Hassell M.D.   On: 01/25/2022 06:38  ? ?DG Hip Unilat W or Wo Pelvis 2-3 Views Left ? ?Result Date: 01/25/2022 ?CLINICAL DATA:  fall EXAM: DG HIP (WITH OR WITHOUT PELVIS) 2-3V LEFT COMPARISON:  CT 06/12/2021 FINDINGS: There is no evidence of hip fracture or dislocation. Mild bilateral hip DJD. Iliofemoral arterial calcifications. Bilateral pelvic phleboliths. IMPRESSION: Mild bilateral hip DJD.  No acute findings. Electronically Signed   By: Corlis Leak  Hassell M.D.   On: 01/25/2022 06:40   ? ? ? ?PROCEDURES: ? ?Critical Care performed: Yes, see critical care procedure note(s) ? ?.Critical Care ?Performed by: Nita SickleVeronese, [place], MD ?Authorized by: Nita SickleVeronese, [place], MD  ? ?Critical care provider statement:  ?  Critical care time  (minutes):  30 ?  Critical care time was exclusive of:  Separately billable procedures and treating other patients ?  Critical care was necessary to treat or prevent imminent or life-threatening deterior

## 2022-01-25 NOTE — Assessment & Plan Note (Addendum)
2D echo on 06/13/2021 showed EF of 55-60% with grade 1 diastolic dysfunction.  Patient does not have edema, no shortness of breath or JVD.  CHF seems to be compensated. Held spironolactone and HCTZ. Will continue to hold spironolactone in light of hyperkalemia 05/24, will restart HCTZ given higher BP though this will probably not have short term effect on potassium, also started back at 25 mg instead of 50 mg given Cr/GFR.

## 2022-01-25 NOTE — ED Triage Notes (Addendum)
BIB [facility] from home due to fall X 1 hour ago, new left lower back pain and generalized weakness (for last few days). Baseline left sided deficits from previous stroke.  ?Hypertensive, 204/98. CBG 201. EMS report that both husband and patient feel that left leg is dragging more than normal and patient reports she feels left arm is weaker than baseline. Unknown LNW as patient states she has been weaker than normal over the last few days. Oriented to person, place and situation-disoriented to time. ?Unable to tell RN what caused fall.  ? ?EDP at bedside. ?

## 2022-01-25 NOTE — Progress Notes (Signed)
VAST RN to bedside to assess patient's vasculature utilizing ultrasound.  ?Able to get a 22G IV in her right anterior forearm; however, did NOT see ANYTHING else we can stick. Her left arm should not be used for IV access d/t her stroke and weakness. If something should happen to this IV, her neck will need to be assessed for an EJ or a central line placed d/t lack of available vessels in her right arm. ?

## 2022-01-25 NOTE — Progress Notes (Signed)
SLP Cancellation Note ? ?Patient Details ?Name: [name] Gilbert ?MRN: [id number] ?DOB: December 09, 1960 ? ? ?Cancelled treatment:        Chart reviewed. Pt visited. Refused to eat lunch tray and does not want to participate with swallow or speech eval but agreed to sips of "sprite". No overt s/s of aspiration. Noted some dysfluencies with speech but able to communicate needs and wants. Husband reports Pt has a "stutter" at baseline. Husband reports her speech sounds mostly normal. ST to follow up 1-2 days to see if Pt has needs we can evaluate and assist with. ? ? ?Lucila Maine ?01/25/2022, 12:33 PM ?

## 2022-01-25 NOTE — Assessment & Plan Note (Addendum)
HTN and hypertensive emergency: Blood pressure 204/98. Initially allowing for permissive hypertension per neurology's recommendation Held home amlodipine, coreg, HCTZ, lisinopril, spironolactone Will continue to hold spironolactone in light of hyperkalemia 05/24, will restart HCTZ given higher BP though this will probably not have short term effect on potassium, also started back at 25 mg instead of 50 mg given Cr/GFR.  As needed enalaprilate for SBP> 220 and dBP > 120

## 2022-01-25 NOTE — ED Notes (Signed)
IV team at bedside 

## 2022-01-25 NOTE — Assessment & Plan Note (Addendum)
Patient states that she had 3 strokes in the past.  Patient has chronic left-sided weakness, which has worsened.  CT head showed relatively large acute to subacute infarct of the posterior and mesial Left temporal lobe, which is new since February.  Patient has elevated blood pressure 204/98.  Initially Cardene drip was started in ED. Consulted Dr. Amada Jupiter neurology, recommended permissive hypertension for SBP > 220 and dBP> 120. ASA and statin were continued, antihypertensives held but added back lower dose HCTZ 05/24, MRI/MRA and Echo as below. Continue Keppra. PT/OT following. Awaiting placement for rehab.

## 2022-01-25 NOTE — ED Notes (Signed)
Patient refused Tylenol.

## 2022-01-25 NOTE — Assessment & Plan Note (Addendum)
Troponin 62.  Denies chest pain or shortness of breath.  Likely demand ischemia

## 2022-01-25 NOTE — Progress Notes (Signed)
VAST consult received to obtain IV access. Patient currently does not have any labs/fluids/studies ordered. Reached out to ER RN who stated patient has had a stroke which has made her left side weaker (previous stroke with left side deficits). Amber, RN stated patient will be admitted and once admitted IV fluids/meds will be ordered.  ?VAST RN to assess patient's vasculature for appropriate line placement.  ?

## 2022-01-25 NOTE — Consult Note (Addendum)
Neurology Consultation Reason for Consult: Stroke Referring Physician: Jarvis Morgan  CC: Speech difficulty  History is obtained from: Patient, chart  HPI: [name] Gilbert is a 61 y.o. female with a history of diabetes, hypertension as well as previous stroke who presents with difficulty speaking and increasing weakness.  Following arrival, she had a CT which showed a subacute infarct she was therefore admitted for stroke work-up.  She complains of some vision difficulty as well as difficulty speaking.  He is able to answer simple questions, but more complex statements are difficult for her.   LKW: Couple of days tpa given?: no, outside of window    Past Medical History:  Diagnosis Date   Diabetes mellitus without complication (HCC)    Hypertension    Stroke (HCC)    visual impaired      Family History  Problem Relation Age of Onset   Diabetes Father      Social History:  reports that she has never smoked. She has never used smokeless tobacco. She reports that she does not drink alcohol and does not use drugs.   Exam: Current vital signs: BP (!) 175/89 (BP Location: Left Arm)   Pulse 98   Temp 99.2 F (37.3 C)   Resp 16   Ht 5\' 2"  (1.575 m)   Wt 82 kg   SpO2 100%   BMI 33.06 kg/m  Vital signs in last 24 hours: Temp:  [98.5 F (36.9 C)-99.2 F (37.3 C)] 99.2 F (37.3 C) (05/17 2000) Pulse Rate:  [82-124] 98 (05/17 2000) Resp:  [9-21] 16 (05/17 2000) BP: (131-199)/(60-109) 175/89 (05/17 2000) SpO2:  [92 %-100 %] 100 % (05/17 2000) Weight:  [82 kg] 82 kg (05/17 0609)   Physical Exam  Constitutional: Appears well-developed and well-nourished.   Neuro: Mental Status: Patient is awake, alert, she is able to give me the year but not month. 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 Pupils are equal, round, and reactive to light.   III,IV, VI: EOMI without ptosis or diploplia.  V: Facial sensation is  symmetric to temperature VII: Facial movement is relatively symmetric Motor: She gives poor effort, but suspect 4/5 in all four extremities sensory: Sensation is diminished in the right leg Cerebellar: She has mild difficulty with finger-nose-finger bilaterally without past-pointing     I have reviewed labs in epic and the results pertinent to this consultation are: Creatinine 1.7  I have reviewed the images obtained: MRI brain-watershed region infarcted on the left.  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  I suspect that she finally occluded her stenotic P1 as opposed to embolic disease.  In the setting of a severely stenotic MCA as well, this left this area at risk.  She will need therapy and secondary risk factor modification.  Given that the stenosis is due to large vessel atherowith persistent stenosis, I would favor 90 days of dual antiplatelet therapy.  Recommendations: - HgbA1c, fasting lipid panel - Frequent neuro checks - Echocardiogram - Prophylactic therapy-Antiplatelet med: Aspirin - dose 81mg  and plavix 75mg  daily  after 300mg  load for 90 days - Risk factor modification - Telemetry monitoring - PT consult, OT consult, Speech consult - Stroke team to follow   66, MD [facility] [phone]  If 7pm- 7am, please page neurology on call as listed in AMION.

## 2022-01-25 NOTE — Progress Notes (Signed)
*  PRELIMINARY RESULTS* ?Echocardiogram ?2D Echocardiogram has been performed. ? ?Shea Kapur, Dorene Sorrow ?01/25/2022, 2:28 PM ?

## 2022-01-25 NOTE — Assessment & Plan Note (Addendum)
Recent baseline creatinine 1.4-1.7.  Cr has trended up to max 2.42* and today 05/24 was 2.10

## 2022-01-25 NOTE — Assessment & Plan Note (Deleted)
[facility]

## 2022-01-25 NOTE — ED Notes (Signed)
Pt has meal tray at bedside and refuses to eat. Pt states her husband will get her something else. ? ?

## 2022-01-25 NOTE — Assessment & Plan Note (Addendum)
Resolved See assessment and plan under hypertension

## 2022-01-25 NOTE — Assessment & Plan Note (Addendum)
Recent A1c 11.6, poorly controlled.  SSI and glargine

## 2022-01-25 NOTE — Progress Notes (Signed)
PT Cancellation Note ? ?Patient Details ?Name: [name] Gilbert ?MRN: [id number] ?DOB: [birth year] ? ? ?Cancelled Treatment:    Reason Eval/Treat Not Completed: Patient at procedure or test/unavailable. Patient having procedure at bedside. Will re-attempt later today or tomorrow as time allows.  ? ? ?[name] ?01/25/2022, 2:14 PM ?

## 2022-01-25 NOTE — ED Notes (Addendum)
Care assumed by primary RN, [place] at bedside.  ?

## 2022-01-26 ENCOUNTER — Inpatient Hospital Stay: Payer: Medicare HMO

## 2022-01-26 DIAGNOSIS — I5032 Chronic diastolic (congestive) heart failure: Secondary | ICD-10-CM | POA: Diagnosis not present

## 2022-01-26 DIAGNOSIS — W19XXXA Unspecified fall, initial encounter: Secondary | ICD-10-CM | POA: Diagnosis not present

## 2022-01-26 DIAGNOSIS — I639 Cerebral infarction, unspecified: Secondary | ICD-10-CM | POA: Diagnosis not present

## 2022-01-26 DIAGNOSIS — S82831A Other fracture of upper and lower end of right fibula, initial encounter for closed fracture: Secondary | ICD-10-CM | POA: Diagnosis not present

## 2022-01-26 LAB — URINE CULTURE

## 2022-01-26 LAB — LIPID PANEL
Cholesterol: 310 mg/dL — ABNORMAL HIGH (ref 0–200)
HDL: 82 mg/dL (ref >40)
LDL Cholesterol: 214 mg/dL — ABNORMAL HIGH (ref 0–99)
Total CHOL/HDL Ratio: 3.8 RATIO
Triglycerides: 70 mg/dL (ref <150)
VLDL: 14 mg/dL (ref 0–40)

## 2022-01-26 LAB — GLUCOSE, CAPILLARY
Glucose-Capillary: 145 mg/dL — ABNORMAL HIGH (ref 70–99)
Glucose-Capillary: 163 mg/dL — ABNORMAL HIGH (ref 70–99)
Glucose-Capillary: 321 mg/dL — ABNORMAL HIGH (ref 70–99)
Glucose-Capillary: 256 mg/dL — ABNORMAL HIGH (ref 70–99)

## 2022-01-26 LAB — HEMOGLOBIN A1C
Hgb A1c MFr Bld: 11.1 % — ABNORMAL HIGH (ref 4.8–5.6)
Mean Plasma Glucose: 271.87 mg/dL

## 2022-01-26 MED ORDER — AMLODIPINE BESYLATE 10 MG PO TABS
10.0000 mg | ORAL_TABLET | Freq: Every day | ORAL | Status: DC
  Administered 2022-01-26 – 2022-01-27 (×2): 10 mg via ORAL
  Filled 2022-01-26 (×2): qty 1

## 2022-01-26 MED ORDER — HYDRALAZINE HCL 25 MG PO TABS
25.0000 mg | ORAL_TABLET | Freq: Three times a day (TID) | ORAL | Status: DC
  Administered 2022-01-26 – 2022-01-27 (×3): 25 mg via ORAL
  Filled 2022-01-26 (×3): qty 1

## 2022-01-26 MED ORDER — INSULIN GLARGINE-YFGN 100 UNIT/ML ~~LOC~~ SOLN
30.0000 [IU] | Freq: Every day | SUBCUTANEOUS | Status: DC
  Administered 2022-01-26: 30 [IU] via SUBCUTANEOUS
  Filled 2022-01-26 (×2): qty 0.3

## 2022-01-26 MED ORDER — HYDROCODONE-ACETAMINOPHEN 7.5-325 MG PO TABS
1.0000 | ORAL_TABLET | Freq: Four times a day (QID) | ORAL | Status: DC | PRN
  Administered 2022-01-26: 2 via ORAL
  Administered 2022-01-26: 1 via ORAL
  Administered 2022-01-28 – 2022-01-30 (×5): 2 via ORAL
  Filled 2022-01-26: qty 2
  Filled 2022-01-26: qty 1
  Filled 2022-01-26 (×6): qty 2

## 2022-01-26 MED ORDER — CARVEDILOL 25 MG PO TABS
25.0000 mg | ORAL_TABLET | Freq: Two times a day (BID) | ORAL | Status: DC
  Administered 2022-01-26 – 2022-01-27 (×2): 25 mg via ORAL
  Filled 2022-01-26 (×2): qty 1

## 2022-01-26 MED ORDER — INSULIN DEGLUDEC 100 UNIT/ML ~~LOC~~ SOPN: 30.0000 [IU] | PEN_INJECTOR | Freq: Every day | SUBCUTANEOUS | Status: DC

## 2022-01-26 MED ORDER — SPIRONOLACTONE 25 MG PO TABS
150.0000 mg | ORAL_TABLET | Freq: Every day | ORAL | Status: DC
  Administered 2022-01-26 – 2022-01-27 (×2): 150 mg via ORAL
  Filled 2022-01-26 (×2): qty 6

## 2022-01-26 NOTE — Consult Note (Addendum)
ORTHOPAEDIC CONSULTATION  REQUESTING PHYSICIAN: [name], MD  Chief Complaint:   Right ankle pain  History of Present Illness: [name] Gilbert is a 61 y.o. female with several medical problems including diabetes, hypertension, and several strokes resulting in chronic left-sided weakness.  The patient normally lives independently with her husband.  She was in her usual state of health until she apparently rolled her ankle when she lost her balance and fell while going to her bathroom 2 nights ago.  She presented to the emergency room the following morning (yesterday) where she was diagnosed with a new CVA and admitted for further evaluation and treatment.  She also was noted to have a blood pressure over [id number] systolic.  Because of the other medical issues, her ankle pain was not addressed.  However, because of continued complaints regarding her right ankle pain, x-rays of her ankle were obtained earlier this afternoon and demonstrated a minimally displaced right distal fibular fracture.  Therefore, orthopedics has been consulted to evaluate and manage her right ankle fracture.  Past Medical History:  Diagnosis Date   Diabetes mellitus without complication ([place])    Hypertension    Stroke ([place])    visual impaired    Past Surgical History:  Procedure Laterality Date   ABDOMINAL HYSTERECTOMY     TONSILLECTOMY     Social History   Socioeconomic History   Marital status: Married    Spouse name: Not on file   Number of children: Not on file   Years of education: Not on file   Highest education level: Not on file  Occupational History   Occupation: home maker  Tobacco Use   Smoking status: Never   Smokeless tobacco: Never  Substance and Sexual Activity   Alcohol use: No   Drug use: No   Sexual activity: Not on file  Other Topics Concern   Not on file  Social History Narrative   Not on file   Social Determinants of  Health   Financial Resource Strain: Not on file  Food Insecurity: Not on file  Transportation Needs: Not on file  Physical Activity: Not on file  Stress: Not on file  Social Connections: Not on file   Family History  Problem Relation Age of Onset   Diabetes Father    Allergies  Allergen Reactions   Metformin Other (See Comments)    Reaction: unknown   Penicillins Hives, Nausea And Vomiting and Swelling    Has patient had a PCN reaction causing immediate rash, facial/tongue/throat swelling, SOB or lightheadedness with hypotension: Yes Has patient had a PCN reaction causing severe rash involving mucus membranes or skin necrosis: No Has patient had a PCN reaction that required hospitalization Yes Has patient had a PCN reaction occurring within the last 10 years: No If all of the above answers are "NO", then may proceed with Cephalosporin use.    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   Prior to Admission medications   Medication Sig Start Date  End Date Taking? Authorizing Provider  amLODipine (NORVASC) 10 MG tablet Take 10 mg by mouth daily.   Yes [provider]  carvedilol (COREG) 12.5 MG tablet Take 25 mg by mouth 2 (two) times daily. 06/10/21  Yes [provider]  insulin aspart (NOVOLOG FLEXPEN) 100 UNIT/ML FlexPen Inject 2 Units into the skin 3 (three) times daily with meals. 11/01/18  Yes Wieting, Richard, MD  rosuvastatin (CRESTOR) 20 MG tablet Take 20 mg by mouth at bedtime. 05/18/21  Yes [provider]  acetaminophen (TYLENOL) 500 MG tablet Take 1,000 mg by mouth daily as needed for pain. 05/27/20   [provider]  aspirin 81 MG chewable tablet Chew 81 mg by mouth every morning. 03/02/21   [provider]  hydrALAZINE (APRESOLINE) 25 MG tablet Take 25 mg by mouth 3 (three) times daily. 04/13/21   [provider]   hydrochlorothiazide (HYDRODIURIL) 50 MG tablet Take 50 mg by mouth daily. 10/20/21   [provider]  insulin degludec (TRESIBA FLEXTOUCH) 100 UNIT/ML SOPN FlexTouch Pen Inject 30 Units into the skin daily. 08/11/19   [provider]  insulin lispro (HUMALOG) 100 UNIT/ML injection Inject 0-30 Units into the skin daily.    [provider]  levETIRAcetam (KEPPRA) 250 MG tablet Take 1 tablet (250 mg total) by mouth 2 (two) times daily. 06/15/21 07/15/21  Sidney Ace, MD  lisinopril (ZESTRIL) 20 MG tablet Take 20 mg by mouth daily. 12/21/21   [provider]  pregabalin (LYRICA) 75 MG capsule Take 75 mg by mouth 2 (two) times daily. 03/08/21   [provider]  spironolactone (ALDACTONE) 50 MG tablet Take 150 mg by mouth daily. 05/13/21   [provider]   DG Ankle Complete Right  Result Date: 01/26/2022 CLINICAL DATA:  Right ankle pain after fall 2 days ago EXAM: RIGHT ANKLE - COMPLETE 3+ VIEW COMPARISON:  None Available. FINDINGS: Acute obliquely oriented fracture of the distal fibular metaphysis with minimal lateral displacement. No fracture identified of the medial or posterior malleoli. Ankle mortise is congruent. No dislocation. Soft tissue swelling about the ankle. IMPRESSION: Acute obliquely oriented fracture of the distal fibular metaphysis with minimal lateral displacement. Electronically Signed   By: Davina Poke D.O.   On: 01/26/2022 14:02   CT Head Wo Contrast  Result Date: 01/25/2022 CLINICAL DATA:  61 year old female status post fall with pain. Hypertensive, 0000000 systolic. Hyperglycemia. EXAM: CT HEAD WITHOUT CONTRAST TECHNIQUE: Contiguous axial images were obtained from the base of the skull through the vertex without intravenous contrast. RADIATION DOSE REDUCTION: This exam was performed according to the departmental dose-optimization program which includes automated exposure control, adjustment of the mA and/or kV according to  patient size and/or use of iterative reconstruction technique. COMPARISON:  Head CT 11/01/2021 and earlier. FINDINGS: Brain: Advanced chronic ischemic disease. Multifocal cerebral encephalomalacia, maximal in the left medial parietal and occipital lobes. Fairly extensive superimposed chronic small vessel disease in the deep gray nuclei and brainstem. However, new broad-based area of cytotoxic edema in the left posterior and mesial temporal lobe (coronal image 39 versus series 4, image 40 in February). This could be acute or subacute. No hemorrhagic transformation or mass effect. Elsewhere gray-white matter differentiation is stable. No midline shift, mass effect, or evidence of intracranial mass lesion. No ventriculomegaly. Normal basilar cisterns. Vascular: Calcified atherosclerosis at the skull base. No suspicious intracranial vascular hyperdensity. Skull: Stable.  No acute osseous abnormality identified. Sinuses/Orbits: Visualized paranasal sinuses and mastoids are clear. Other: No  acute orbit or scalp soft tissue injury. IMPRESSION: 1. Relatively large acute to subacute infarct of the posterior and mesial Left temporal lobe, new since February. 2. No hemorrhagic transformation or significant mass effect. 3. Otherwise stable advanced underlying chronic ischemic disease. 4. No acute traumatic injury identified. Electronically Signed   By: Genevie Ann M.D.   On: 01/25/2022 06:38   CT Cervical Spine Wo Contrast  Result Date: 01/25/2022 CLINICAL DATA:  Study identified as missing a report at 9:19 am on 01/25/2022. 61 year old female status post fall with pain. Hypertensive, 0000000 systolic. Hyperglycemia. EXAM: CT CERVICAL SPINE WITHOUT CONTRAST TECHNIQUE: Multidetector CT imaging of the cervical spine was performed without intravenous contrast. Multiplanar CT image reconstructions were also generated. RADIATION DOSE REDUCTION: This exam was performed according to the departmental dose-optimization program which includes  automated exposure control, adjustment of the mA and/or kV according to patient size and/or use of iterative reconstruction technique. COMPARISON:  Head CT the same day reported separately. FINDINGS: Alignment: Straightening of cervical lordosis. Cervicothoracic junction alignment is within normal limits. Bilateral posterior element alignment is within normal limits. Skull base and vertebrae: Visualized skull base is intact. No atlanto-occipital dissociation. C1 and C2 appear intact and aligned. No acute osseous abnormality identified. Soft tissues and spinal canal: No prevertebral fluid or swelling. No visible canal hematoma. Negative noncontrast visible neck soft tissues, mild carotid calcified atherosclerosis. Disc levels: Mild for age cervical spine degeneration, most pronounced at C5-C6. Upper chest: Negative. IMPRESSION: 1. No acute traumatic injury identified in the cervical spine. 2. Mild for age cervical spine degeneration. Electronically Signed   By: Genevie Ann M.D.   On: 01/25/2022 09:21   MR ANGIO HEAD WO CONTRAST  Result Date: 01/25/2022 CLINICAL DATA:  Neuro deficit, acute, stroke suspected; Vertebral artery aneurysm suspected EXAM: MRI HEAD WITHOUT CONTRAST MRA HEAD WITHOUT CONTRAST MRA OF THE NECK WITHOUT AND WITH CONTRAST TECHNIQUE: Multiplanar, multi-echo pulse sequences of the brain and surrounding structures were acquired without intravenous contrast. Angiographic images of the Circle of Willis were acquired using MRA technique without intravenous contrast. Angiographic images of the neck were acquired using MRA technique without and with intravenous contrast. Carotid stenosis measurements (when applicable) are obtained utilizing NASCET criteria, using the distal internal carotid diameter as the denominator. CONTRAST:  7.49mL GADAVIST GADOBUTROL 1 MMOL/ML IV SOLN COMPARISON:  CT head Jan 25, 2022. MRA FINDINGS: MR HEAD FINDINGS Brain: Confluent acute infarct in the medial left temporal lobe with  additional punctate infarct in the left thalamus. Associated edema without significant mass effect. Additional patchy T2/FLAIR hyperintensities in the white matter and pons, nonspecific but both the microvascular disease. Remote infarcts in bilateral parieto-occipital lobes. Vascular: Detailed below. Skull and upper cervical spine: Normal marrow signal. Sinuses/Orbits: Clear sinuses.  No acute orbital findings. Other: No mastoid effusions. MRA HEAD FINDINGS Anterior circulation: Bilateral intracranial ICAs, MCAs, and ACAs are patent. Similar severe distal M1/proximal M2 MCA stenosis bilaterally. Similar absent left A1 ACA with prominent right A1 ACA, probably congenital/anatomic variant. Similar mild-to-moderate right A1 ACA stenosis. Posterior circulation: Bilateral intradural vertebral arteries, basilar artery and right posterior cerebral artery are patent without significant proximal stenosis. Left P1 PCA is a occluded with absence of flow related signal distally. MRA NECK FINDINGS Motion limited study.  Within this limitation: Aortic arch: Great vessel origins are patent. Right carotid system: Patent without evidence of significant (greater than 50%) stenosis. Left carotid system: Patent without evidence of significant (greater than 50%) stenosis. Vertebral arteries: Patent bilaterally without evidence of  significant (greater than 50%) stenosis. IMPRESSION: MRI: 1. Confluent acute infarct in the medial left temporal lobe with additional punctate infarct in the left thalamus. Associated edema without significant mass effect. 2. Remote infarcts and moderate chronic microvascular disease. MRA Head: 1. Left P1 PCA occlusion, previously severely stenotic. 2. Similar severe distal M1/proximal M2 MCA stenosis bilaterally. Similar mild to moderate right A1 ACA stenosis. MRA Neck: No evidence of significant (greater than 50%) stenosis. Findings were communicated on 01/25/2022 at 11:01 am to provider Blaine Hamper via telephone, who  verbally acknowledged these results. Electronically Signed   By: Margaretha Sheffield M.D.   On: 01/25/2022 11:04   MR ANGIO NECK W WO CONTRAST  Result Date: 01/25/2022 CLINICAL DATA:  Neuro deficit, acute, stroke suspected; Vertebral artery aneurysm suspected EXAM: MRI HEAD WITHOUT CONTRAST MRA HEAD WITHOUT CONTRAST MRA OF THE NECK WITHOUT AND WITH CONTRAST TECHNIQUE: Multiplanar, multi-echo pulse sequences of the brain and surrounding structures were acquired without intravenous contrast. Angiographic images of the Circle of Willis were acquired using MRA technique without intravenous contrast. Angiographic images of the neck were acquired using MRA technique without and with intravenous contrast. Carotid stenosis measurements (when applicable) are obtained utilizing NASCET criteria, using the distal internal carotid diameter as the denominator. CONTRAST:  7.1mL GADAVIST GADOBUTROL 1 MMOL/ML IV SOLN COMPARISON:  CT head Jan 25, 2022. MRA FINDINGS: MR HEAD FINDINGS Brain: Confluent acute infarct in the medial left temporal lobe with additional punctate infarct in the left thalamus. Associated edema without significant mass effect. Additional patchy T2/FLAIR hyperintensities in the white matter and pons, nonspecific but both the microvascular disease. Remote infarcts in bilateral parieto-occipital lobes. Vascular: Detailed below. Skull and upper cervical spine: Normal marrow signal. Sinuses/Orbits: Clear sinuses.  No acute orbital findings. Other: No mastoid effusions. MRA HEAD FINDINGS Anterior circulation: Bilateral intracranial ICAs, MCAs, and ACAs are patent. Similar severe distal M1/proximal M2 MCA stenosis bilaterally. Similar absent left A1 ACA with prominent right A1 ACA, probably congenital/anatomic variant. Similar mild-to-moderate right A1 ACA stenosis. Posterior circulation: Bilateral intradural vertebral arteries, basilar artery and right posterior cerebral artery are patent without significant  proximal stenosis. Left P1 PCA is a occluded with absence of flow related signal distally. MRA NECK FINDINGS Motion limited study.  Within this limitation: Aortic arch: Great vessel origins are patent. Right carotid system: Patent without evidence of significant (greater than 50%) stenosis. Left carotid system: Patent without evidence of significant (greater than 50%) stenosis. Vertebral arteries: Patent bilaterally without evidence of significant (greater than 50%) stenosis. IMPRESSION: MRI: 1. Confluent acute infarct in the medial left temporal lobe with additional punctate infarct in the left thalamus. Associated edema without significant mass effect. 2. Remote infarcts and moderate chronic microvascular disease. MRA Head: 1. Left P1 PCA occlusion, previously severely stenotic. 2. Similar severe distal M1/proximal M2 MCA stenosis bilaterally. Similar mild to moderate right A1 ACA stenosis. MRA Neck: No evidence of significant (greater than 50%) stenosis. Findings were communicated on 01/25/2022 at 11:01 am to provider Blaine Hamper via telephone, who verbally acknowledged these results. Electronically Signed   By: Margaretha Sheffield M.D.   On: 01/25/2022 11:04   MR BRAIN WO CONTRAST  Result Date: 01/25/2022 CLINICAL DATA:  Neuro deficit, acute, stroke suspected; Vertebral artery aneurysm suspected EXAM: MRI HEAD WITHOUT CONTRAST MRA HEAD WITHOUT CONTRAST MRA OF THE NECK WITHOUT AND WITH CONTRAST TECHNIQUE: Multiplanar, multi-echo pulse sequences of the brain and surrounding structures were acquired without intravenous contrast. Angiographic images of the Circle of Willis were acquired using MRA  technique without intravenous contrast. Angiographic images of the neck were acquired using MRA technique without and with intravenous contrast. Carotid stenosis measurements (when applicable) are obtained utilizing NASCET criteria, using the distal internal carotid diameter as the denominator. CONTRAST:  7.4mL GADAVIST GADOBUTROL  1 MMOL/ML IV SOLN COMPARISON:  CT head Jan 25, 2022. MRA FINDINGS: MR HEAD FINDINGS Brain: Confluent acute infarct in the medial left temporal lobe with additional punctate infarct in the left thalamus. Associated edema without significant mass effect. Additional patchy T2/FLAIR hyperintensities in the white matter and pons, nonspecific but both the microvascular disease. Remote infarcts in bilateral parieto-occipital lobes. Vascular: Detailed below. Skull and upper cervical spine: Normal marrow signal. Sinuses/Orbits: Clear sinuses.  No acute orbital findings. Other: No mastoid effusions. MRA HEAD FINDINGS Anterior circulation: Bilateral intracranial ICAs, MCAs, and ACAs are patent. Similar severe distal M1/proximal M2 MCA stenosis bilaterally. Similar absent left A1 ACA with prominent right A1 ACA, probably congenital/anatomic variant. Similar mild-to-moderate right A1 ACA stenosis. Posterior circulation: Bilateral intradural vertebral arteries, basilar artery and right posterior cerebral artery are patent without significant proximal stenosis. Left P1 PCA is a occluded with absence of flow related signal distally. MRA NECK FINDINGS Motion limited study.  Within this limitation: Aortic arch: Great vessel origins are patent. Right carotid system: Patent without evidence of significant (greater than 50%) stenosis. Left carotid system: Patent without evidence of significant (greater than 50%) stenosis. Vertebral arteries: Patent bilaterally without evidence of significant (greater than 50%) stenosis. IMPRESSION: MRI: 1. Confluent acute infarct in the medial left temporal lobe with additional punctate infarct in the left thalamus. Associated edema without significant mass effect. 2. Remote infarcts and moderate chronic microvascular disease. MRA Head: 1. Left P1 PCA occlusion, previously severely stenotic. 2. Similar severe distal M1/proximal M2 MCA stenosis bilaterally. Similar mild to moderate right A1 ACA stenosis.  MRA Neck: No evidence of significant (greater than 50%) stenosis. Findings were communicated on 01/25/2022 at 11:01 am to provider Blaine Hamper via telephone, who verbally acknowledged these results. Electronically Signed   By: Margaretha Sheffield M.D.   On: 01/25/2022 11:04   DG Chest Portable 1 View  Result Date: 01/25/2022 CLINICAL DATA:  BIB [facility] from home due to fall X 1 hour ago, new left lower back pain and generalized weakness (for last few days). Baseline left sided deficits from previous stroke. Hypertensive, 204/98. CBG 201 Unable to tell RN what caused fall. EXAM: PORTABLE CHEST - 1 VIEW COMPARISON:  10/02/2019 FINDINGS: Lungs are clear. Heart size and mediastinal contours are within normal limits. No effusion.  No pneumothorax. Visualized bones unremarkable. IMPRESSION: No acute cardiopulmonary disease. Electronically Signed   By: Lucrezia Europe M.D.   On: 01/25/2022 06:38   ECHOCARDIOGRAM COMPLETE  Result Date: 01/25/2022    ECHOCARDIOGRAM REPORT   Patient Name:   [name] Gilbert Date of Exam: 01/25/2022 Medical Rec #:  [id number]        Height:       62.0 in Accession #:    [id number]       Weight:       180.8 lb Date of Birth:  [birth year]        BSA:          1.831 m Patient Age:    51 years         BP:           158/85 mmHg Patient Gender: F                HR:  96 bpm. Exam Location:  [facility] Procedure: 2D Echo, Cardiac Doppler and Color Doppler Indications:     Stroke I63.9  History:         Patient has prior history of Echocardiogram examinations, most                  recent 06/13/2021. Stroke; Risk Factors:Hypertension and                  Diabetes.  Sonographer:     Sherrie Sport Referring Phys:  [id number] Soledad Gerlach NIU Diagnosing Phys: Yolonda Kida MD  Sonographer Comments: Suboptimal apical window and no subcostal window. IMPRESSIONS  1. Left ventricular ejection fraction, by estimation, is 60 to 65%. The left ventricle has normal function. The left ventricle has no regional wall motion abnormalities.  Left ventricular diastolic parameters are consistent with Grade I diastolic dysfunction (impaired relaxation).  2. Right ventricular systolic function is normal. The right ventricular size is normal.  3. The mitral valve is normal in structure. Trivial mitral valve regurgitation.  4. The aortic valve is normal in structure. Aortic valve regurgitation is not visualized. FINDINGS  Left Ventricle: Left ventricular ejection fraction, by estimation, is 60 to 65%. The left ventricle has normal function. The left ventricle has no regional wall motion abnormalities. The left ventricular internal cavity size was normal in size. There is  borderline left ventricular hypertrophy. Left ventricular diastolic parameters are consistent with Grade I diastolic dysfunction (impaired relaxation). Right Ventricle: The right ventricular size is normal. No increase in right ventricular wall thickness. Right ventricular systolic function is normal. Left Atrium: Left atrial size was normal in size. Right Atrium: Right atrial size was normal in size. Pericardium: There is no evidence of pericardial effusion. Mitral Valve: The mitral valve is normal in structure. Trivial mitral valve regurgitation. MV peak gradient, 4.7 mmHg. The mean mitral valve gradient is 2.0 mmHg. Tricuspid Valve: The tricuspid valve is normal in structure. Tricuspid valve regurgitation is trivial. Aortic Valve: The aortic valve is normal in structure. Aortic valve regurgitation is not visualized. Aortic valve mean gradient measures 3.0 mmHg. Aortic valve peak gradient measures 5.0 mmHg. Aortic valve area, by VTI measures 2.60 cm. Pulmonic Valve: The pulmonic valve was normal in structure. Pulmonic valve regurgitation is not visualized. Aorta: The ascending aorta was not well visualized. IAS/Shunts: No atrial level shunt detected by color flow Doppler.  LEFT VENTRICLE PLAX 2D LVIDd:         3.30 cm   Diastology LVIDs:         2.10 cm   LV e' medial:    4.57 cm/s LV PW:          1.20 cm   LV E/e' medial:  11.1 LV IVS:        0.90 cm   LV e' lateral:   6.31 cm/s LVOT diam:     2.00 cm   LV E/e' lateral: 8.0 LV SV:         48 LV SV Index:   26 LVOT Area:     3.14 cm  RIGHT VENTRICLE RV Basal diam:  3.10 cm RV S prime:     13.60 cm/s TAPSE (M-mode): 2.0 cm LEFT ATRIUM             Index        RIGHT ATRIUM           Index LA diam:        2.70 cm 1.47 cm/m  RA Area:     10.70 cm LA Vol (A2C):   31.3 ml 17.09 ml/m  RA Volume:   23.80 ml  13.00 ml/m LA Vol (A4C):   21.7 ml 11.85 ml/m LA Biplane Vol: 26.3 ml 14.36 ml/m  AORTIC VALVE                    PULMONIC VALVE AV Area (Vmax):    2.56 cm     RVOT Peak grad: 8 mmHg AV Area (Vmean):   2.56 cm AV Area (VTI):     2.60 cm AV Vmax:           112.00 cm/s AV Vmean:          84.200 cm/s AV VTI:            0.186 m AV Peak Grad:      5.0 mmHg AV Mean Grad:      3.0 mmHg LVOT Vmax:         91.40 cm/s LVOT Vmean:        68.700 cm/s LVOT VTI:          0.154 m LVOT/AV VTI ratio: 0.83  AORTA Ao Root diam: 2.40 cm MITRAL VALVE                TRICUSPID VALVE MV Area (PHT): 3.91 cm     TR Peak grad:   11.3 mmHg MV Area VTI:   3.04 cm     TR Vmax:        168.00 cm/s MV Peak grad:  4.7 mmHg MV Mean grad:  2.0 mmHg     SHUNTS MV Vmax:       1.08 m/s     Systemic VTI:  0.15 m MV Vmean:      59.3 cm/s    Systemic Diam: 2.00 cm MV Decel Time: 194 msec     Pulmonic VTI:  0.236 m MV E velocity: 50.60 cm/s MV A velocity: 104.00 cm/s MV E/A ratio:  0.49 Dwayne D Callwood MD Electronically signed by Yolonda Kida MD Signature Date/Time: 01/25/2022/8:28:00 PM    Final    DG Hip Unilat W or Wo Pelvis 2-3 Views Left  Result Date: 01/25/2022 CLINICAL DATA:  fall EXAM: DG HIP (WITH OR WITHOUT PELVIS) 2-3V LEFT COMPARISON:  CT 06/12/2021 FINDINGS: There is no evidence of hip fracture or dislocation. Mild bilateral hip DJD. Iliofemoral arterial calcifications. Bilateral pelvic phleboliths. IMPRESSION: Mild bilateral hip DJD.  No acute findings.  Electronically Signed   By: Lucrezia Europe M.D.   On: 01/25/2022 06:40    Positive ROS: All other systems have been reviewed and were otherwise negative with the exception of those mentioned in the HPI and as above.  Physical Exam: General:  Alert, no acute distress Psychiatric:  Patient is competent for consent with normal mood and affect   Cardiovascular:  No pedal edema Respiratory:  No wheezing, non-labored breathing GI:  Abdomen is soft and non-tender Skin:  No lesions in the area of chief complaint Neurologic:  Sensation intact distally Lymphatic:  No axillary or cervical lymphadenopathy  Orthopedic Exam:  Orthopedic examination is limited to the right foot and lower leg.  Skin inspection is notable for moderate swelling around the lateral aspect of the ankle, but no erythema, ecchymosis, abrasions, or other skin abnormalities are identified.  She has moderate to severe pain with palpation over the lateral aspect of the ankle but has little if any pain medially.  She  also has moderate pain with any attempted active or passive motion of the ankle.  She has good capillary refill to her foot.  However, sensation is decreased to light touch to her toes, and active motion of her toes is limited if present at all, presumably due to her history of prior strokes.  X-rays:  Recent x-rays of the right ankle are available for review and have been reviewed by myself.  These films demonstrate a minimally displaced oblique fracture of the right distal fibula (Weber B) without disruption of the mortise.  There is no widening of the medial aspect of the mortise.  No other fractures are identified.  No significant degenerative changes are noted.  Assessment: Stable SER 2 distal fibular fracture, right ankle.  Plan: The treatment options have been discussed with the patient and her husband, who is at her bedside.  Given that this is a stable injury, I do not feel there is any need for surgical intervention  at this time.  Therefore, the patient is placed into a posterior splint with a sugar-tong supplement, maintaining the ankle in neutral dorsiflexion.  The patient tolerated this splinting well.  The patient is to keep her leg elevated to avoid any weightbearing on the right foot.  Ice can be applied to the anterior aspect of the ankle as well.  She is able to receive any pain medication as deemed appropriate medically.  Thank you for asking me to participate in the care of this most pleasant yet unfortunate woman.  I will be happy to follow her with you.   Pascal Lux, MD  Beeper #:  [phone]  01/26/2022 3:56 PM

## 2022-01-26 NOTE — Evaluation (Signed)
Speech Language Pathology Evaluation Patient Details Name: [name] Gilbert MRN: [id number] DOB: 1961-08-28 Today's Date: 01/26/2022 Time: 1130-1230 SLP Time Calculation (min) (ACUTE ONLY): 60 min  Problem List:  Patient Active Problem List   Diagnosis Date Noted   Stroke ([place]) 01/25/2022   HLD (hyperlipidemia) 01/25/2022   Type II diabetes mellitus with renal manifestations ([place]) 01/25/2022   Stage 3b chronic kidney disease (CKD) ([place]) 01/25/2022   Chronic diastolic CHF (congestive heart failure) ([place]) 01/25/2022   Hypertensive emergency 01/25/2022   Elevated troponin 01/25/2022   Fall    Neurologic abnormality 06/13/2021   Hypoglycemia 06/13/2021   Hypothermia 06/13/2021   Dehydration 06/12/2021   Headache 06/12/2021   Abdominal pain 06/12/2021   Hypoglycemia associated with diabetes ([place]) 06/12/2021   Acute respiratory failure with hypoxia ([facility])    HHNC (hyperglycemic hyperosmolar nonketotic coma) ([place]) 10/01/2019   HTN (hypertension) 11/01/2018   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     Pt states that she had history of 3 strokes in the past.  She has chronic left-sided weakness.  She states that she has been feeling weaker in the past 2 days.  She feels like her  left-sided weakness has worsened. She fell in the early morning. No LOC per her husband.  Denies headache or neck pain.  She has mild left hip pain.  Patient does not have facial droop or slurred speech.  Denies chest pain, cough, shortness breath, no fever or chills.  Denies nausea vomiting, diarrhea or abdominal pain. Denies symptoms of UTI.  She states that her blood pressure has been poorly controlled recently.  Her blood pressure is at 204/98 in ED. Per chart review, patient was started on Keppra since last stroke.  She denies history of seizure."   Assessment / Plan / Recommendation Clinical Impression  Pt presents with Mild to moderate expressive and receptive deficits. Husband previously reported that speech is at basline from previous strokes. Today, Pt reports she is having more difficulty getting her words out over the past few days. She is able to communicate needs and wants but has more diffculty at the conversation level. No apparent dysnomias or paraphasias. Namimg and repetition is intact. Noted difficulty with multitep commands and complex yes/ no questions. Pt reports she can read but does not read and did demonstrate ability to read at the phrase level. Pt continues to refuse to eat the hospital provided food and reports her husband is bringing her food from outside. She reports no dysphagia. Pt may benefit from ST services at discharge for high level expressive and receptive tasks.    SLP Assessment  SLP Recommendation/Assessment: All further Speech Lanaguage Pathology  needs can be addressed in the next venue of care SLP Visit Diagnosis: Aphasia (R47.01)    Recommendations for follow up therapy are  one component of a multi-disciplinary discharge planning process, led by the attending physician.  Recommendations may be updated based on patient status, additional functional criteria and insurance authorization.    Follow Up Recommendations    St eval and Tx at discharge if Pt is  wiling   Assistance Recommended at Discharge   ST   Functional Status Assessment    Frequency and Duration           SLP Evaluation Cognition  Overall Cognitive Status: Impaired/Different from baseline Arousal/Alertness: Awake/alert Orientation Level: Oriented to person;Oriented to place;Oriented to situation Attention: Focused Focused Attention: Impaired Memory: Appears intact Awareness: Appears intact       Comprehension  Auditory Comprehension Overall Auditory Comprehension: Impaired Yes/No Questions: Impaired Complex Questions: 50-74% accurate Commands: Impaired Multistep Basic Commands: 25-49% accurate Conversation: Simple Interfering Components: Processing speed EffectiveTechniques: Slowed speech Visual Recognition/Discrimination Discrimination: Within Function Limits Reading Comprehension Reading Status:  (functional for simple sentences)    Expression Expression Primary Mode of Expression: Verbal Verbal Expression Overall Verbal Expression: Impaired Initiation: No impairment Automatic Speech: Social Response Level of Generative/Spontaneous Verbalization: Sentence Repetition: No impairment Naming: No impairment Pragmatics: No impairment Written Expression Dominant Hand: Right Written Expression: Not tested   Oral / Motor  Motor Speech Overall Motor Speech: Impaired Respiration: Within functional limits Phonation: Normal Resonance: Within functional limits Articulation: Impaired Level of Impairment: Sentence Intelligibility: Intelligible Effective Techniques: Slow rate            Lucila Maine 01/26/2022, 2:00 PM

## 2022-01-26 NOTE — Progress Notes (Signed)
Subjective: No significant changes, she is having a splint applied  Exam: Vitals:   01/26/22 1231 01/26/22 1608  BP: (!) 172/91 (!) 148/87  Pulse: 75 78  Resp: 18 17  Temp: 98 F (36.7 C) 98.3 F (36.8 C)  SpO2: 100% 100%   Gen: In bed, NAD  Neuro: MS: She is awake, alert, interactive and appropriate.  She continues to have mild difficulty with speech, but slightly improved from yesterday. CN: Pupils equal round reactive, she has a right hemianopia. Motor: She is able to hold both arms against gravity, mild drift bilaterally Sensory: She has mildly decreased sensation in the right leg  Pertinent Labs: LDL 214 A1c 11  Echo without embolic source  Impression: 61 year old female with severe intracranial atherosclerotic disease with poorly controlled risk factors of hyperlipidemia, hypercholesterolemia.  Modification of these risk factors is means of secondary prevention,, as well as dual antiplatelet therapy for 90 days.  Recommendations: 1) continue aspirin 81 mg and 81 mg and Plavix 75 mg daily for 90 days following this could use aspirin monotherapy. 2) PT, OT, ST 3) she will need aggressive statin therapy, previously on 20 mg of Crestor, would increase to 40 mg a milligrams, also may need to consider adjunctive therapies as well for goal LDL less than 70 4) neurology will be available as needed, please call with further questions or concerns.  Ritta Slot, MD [facility] [phone]  If 7pm- 7am, please page neurology on call as listed in [facility].

## 2022-01-26 NOTE — Progress Notes (Addendum)
Inpatient Diabetes Program Recommendations  AACE/ADA: New Consensus Statement on Inpatient Glycemic Control   Target Ranges:  Prepandial:   less than 140 mg/dL      Peak postprandial:   less than 180 mg/dL (1-2 hours)      Critically ill patients:  140 - 180 mg/dL    Latest Reference Range & Units 01/25/22 11:50 01/25/22 16:50 01/25/22 21:32 01/26/22 07:28  Glucose-Capillary 70 - 99 mg/dL 606 (H) 301 (H) 601 (H) 145 (H)    Latest Reference Range & Units 06/12/21 03:42 01/25/22 20:08  Hemoglobin A1C 4.8 - 5.6 % 11.6 (H) 11.1 (H)   Review of Glycemic Control  Diabetes history: DM2 Outpatient Diabetes medications: Tresiba 30 units daily, Humalog 2 units TID with meals Current orders for Inpatient glycemic control: Semglee 9 units QHS, Novolog 0-9 units TID with meals, Novolog 0-5 units QHS  Inpatient Diabetes Program Recommendations:    Insulin: Please consider ordering Novolog 3 units TID with meals for meal coverage if patient eats at least 50% of meals.  HbgA1C:  A1C 11.1% on 01/25/22 indicating an average glucose of 272 mg/dl over the past 2-3 months.  NOTE: Per chart review, noted patient sees Dr. Leda Roys [facility] Endocrinology) for DM management and was last seen 12/01/21. Per note on 12/01/21 patient was advised to take Tresiba 10 units QHS and Humalog 3 units with meals. Dr. Leda Roys noted "I do not believe she has the ability to manage her diabetes and blood pressure issues alone. She will not let her husband help, has refused home health nursing. She is adamant that she can manage her diabetes safely. If not able to follow instructions and follow up with CDE, I told her I will inform adult protective services of self neglect. I talked to her husband separately. He never comes back to the exam room with patient. He is concerned that she is not taking medications as directed. She will have pills out and then leave them on the table and forget. He says that she has always been stubborn and  not accepted help. She will not cooperate with home health assistance or send them away." Patient was seen by Viviann Spare, RD, CDCES on 12/21/21. Per office note on 12/21/21, "pt reports she has not had humalog for >1 month, has been going back and forth with pharmacy. Will request Dr. Leda Roys send humalog to another pharmacy [facility] in [place] . Will also request Dr. Leda Roys order Dexcom G7 CGM- husband to come to next appointment 5/17 @10 :15am to learn how to use."  Addendum 01/26/22@10 :50-Spoke with patient at bedside regarding DM control. Patient states that she takes 01/28/22 30 daily and Humalog 4 units TID with meals for DM control. Patient states that she is taking insulin consistently. Patient reports that she is checking glucose several times a day and it ranges from 200's-300's mg/dl usually. Patient notes that she had an appointment yesterday to get started on Dexcom CGM but she missed the appointment due to coming to the hospital. Patient states that she use to have a CGM in the past and she was looking forward to getting started on the Dexcom. Patient reports that it is frustrating that her glucose stays consistently elevated despite taking insulin as prescribed. Discussed A1C results (11.1% on 01/25/22) and explained that current A1C indicates an average glucose of 272 mg/dl over the past 2-3 months. Discussed glucose and A1C goals. Explained how hyperglycemia leads to damage within blood vessels which lead to the common complications seen with uncontrolled  diabetes. Stressed to the patient the importance of improving glycemic control to prevent further complications from uncontrolled diabetes. Encouraged patient to call Endocrinologist office and get appointment for Dexcom start rescheduled as soon as possible. Patient states she has all needed DM medications and supplies at home.  Patient verbalized understanding of information discussed and reports no further questions at this time related to  diabetes.   Thanks, Orlando Penner, RN, MSN, CDCES Diabetes Coordinator Inpatient Diabetes Program [phone] (Team Pager from 8am to 5pm)

## 2022-01-26 NOTE — Evaluation (Signed)
Physical Therapy Evaluation Patient Details Name: [name] Gilbert MRN: [id number] DOB: November 14, 1960 Today's Date: 01/26/2022  History of Present Illness  Patient is a 61 year old female with  a history of diabetes, hypertension as well as previous stroke who presents with difficulty speaking and increasing weakness.  Brain MRI reports confluent acute infarct in the medial left temporal lobe with  additional punctate infarct in the left thalamus, Associated edema  without significant mass effect, and remote infarcts  Clinical Impression  Patient was agreeable to PT evaluation. She reports she is independent with mobility at baseline and lives with her spouse. She reports she fell at home within the past 2 days, but no other falls in the past 6 months.  Today, the patient complains of right ankle pain with active movement and is also tender to palpation. She guards right leg with movement, but RLE does appear mildly weaker compared to left leg. She has difficulty moving without assistance. She was able to take a few steps with the rolling walker but has difficulty with advancement of the right leg. Fatigue with minimal activity. She is not at her baseline level of functional mobility. Recommend PT follow up to maximize independence and facilitate return to prior level of function. SNF recommended at this time.      Recommendations for follow up therapy are one component of a multi-disciplinary discharge planning process, led by the attending physician.  Recommendations may be updated based on patient status, additional functional criteria and insurance authorization.  Follow Up Recommendations Skilled nursing-short term rehab (<3 hours/day)    Assistance Recommended at Discharge Intermittent Supervision/Assistance  Patient can return home with the following  A lot of help with walking and/or transfers;A lot of help with bathing/dressing/bathroom;Assist for transportation;Help with stairs or ramp for  entrance    Equipment Recommendations None recommended by PT  Recommendations for Other Services       Functional Status Assessment Patient has had a recent decline in their functional status and demonstrates the ability to make significant improvements in function in a reasonable and predictable amount of time.     Precautions / Restrictions Precautions Precautions: Fall Restrictions Weight Bearing Restrictions: No      Mobility  Bed Mobility Overal bed mobility: Needs Assistance Bed Mobility: Supine to Sit, Sit to Supine     Supine to sit: Min assist, HOB elevated Sit to supine: Mod assist   General bed mobility comments: verbal cues for sequencing and technique. assistance for RLE support and occasional trunk support provided. increased time and effort with activity    Transfers Overall transfer level: Needs assistance Equipment used: Rolling walker (2 wheels) Transfers: Sit to/from Stand Sit to Stand: Min assist           General transfer comment: verbal cues for hand placement for safety. decreased safety awareness with sitting abruptly and decreased eccentric control    Ambulation/Gait Ambulation/Gait assistance: Min assist Gait Distance (Feet): 2 Feet Assistive device: Rolling walker (2 wheels) Gait Pattern/deviations: Step-to pattern, Decreased stride length, Decreased stance time - right Gait velocity: decreased     General Gait Details: decreased weight acceptance on RLE. patient complains of some pain in the right ankle. difficulty with advancement of RLE and unable to take a step backward. further standing activity limited by fatigue  Stairs            Wheelchair Mobility    Modified Rankin (Stroke Patients Only)       Balance Overall balance assessment: Needs  assistance Sitting-balance support: No upper extremity supported, Feet supported Sitting balance-Leahy Scale: Fair     Standing balance support: Bilateral upper extremity  supported, During functional activity, Reliant on assistive device for balance Standing balance-Leahy Scale: Poor Standing balance comment: Min A to Min guard for safety                             Pertinent Vitals/Pain Pain Assessment Pain Assessment: Faces Faces Pain Scale: Hurts even more Pain Location: right ankle Pain Descriptors / Indicators: Grimacing, Guarding, Discomfort Pain Intervention(s): Monitored during session, Limited activity within patient's tolerance, Repositioned    Home Living Family/patient expects to be discharged to:: Private residence Living Arrangements: Spouse/significant other Available Help at Discharge: Family;Available PRN/intermittently Type of Home: House Home Access: Stairs to enter Entrance Stairs-Rails: None Entrance Stairs-Number of Steps: 2   Home Layout: One level Home Equipment: Agricultural consultant (2 wheels)      Prior Function Prior Level of Function : Independent/Modified Independent                     Hand Dominance   Dominant Hand: Right    Extremity/Trunk Assessment   Upper Extremity Assessment Upper Extremity Assessment: Generalized weakness    Lower Extremity Assessment Lower Extremity Assessment: RLE deficits/detail;LLE deficits/detail (alerted MD of right ankle pain which was tender to palpation. elevated RLE with pillow for comfort at end of session) RLE Deficits / Details: ankle ROM is limited due to pain with movement. tender to palpation around distal tibia and down into foot. minimal weight bearing with standing RLE: Unable to fully assess due to pain RLE Sensation:  (she is able to detct light touch but reports RLE sensation feels diminished compared to left) LLE Deficits / Details: WNL LLE Sensation: WNL       Communication   Communication: Expressive difficulties  Cognition Arousal/Alertness: Awake/alert Behavior During Therapy: Flat affect Overall Cognitive Status: Impaired/Different from  baseline Area of Impairment: Following commands                       Following Commands: Follows one step commands with increased time       General Comments: patient reports feeling "foggy" like her thinking is not clear. she is able to follow single step commands with increased time        General Comments      Exercises     Assessment/Plan    PT Assessment Patient needs continued PT services  PT Problem List Decreased strength;Decreased range of motion;Decreased activity tolerance;Decreased balance;Decreased mobility;Decreased safety awareness;Impaired sensation;Decreased cognition;Decreased knowledge of use of DME;Pain       PT Treatment Interventions DME instruction;Gait training;Stair training;Functional mobility training;Therapeutic activities;Therapeutic exercise;Balance training;Neuromuscular re-education;Cognitive remediation;Patient/family education    PT Goals (Current goals can be found in the Care Plan section)  Acute Rehab PT Goals Patient Stated Goal: to feel better PT Goal Formulation: With patient Time For Goal Achievement: 02/09/22 Potential to Achieve Goals: Good    Frequency Min 2X/week     Co-evaluation               AM-PAC PT "6 Clicks" Mobility  Outcome Measure Help needed turning from your back to your side while in a flat bed without using bedrails?: A Little Help needed moving from lying on your back to sitting on the side of a flat bed without using bedrails?: A Little Help needed moving to  and from a bed to a chair (including a wheelchair)?: A Little Help needed standing up from a chair using your arms (e.g., wheelchair or bedside chair)?: A Little Help needed to walk in hospital room?: A Lot Help needed climbing 3-5 steps with a railing? : Total 6 Click Score: 15    End of Session   Activity Tolerance: Patient tolerated treatment well Patient left: in bed;with call bell/phone within reach;with bed alarm set (alerted MD  of ankle pain) Nurse Communication: Mobility status PT Visit Diagnosis: Other abnormalities of gait and mobility (R26.89);Muscle weakness (generalized) (M62.81);Pain Pain - Right/Left: Right Pain - part of body: Ankle and joints of foot    Time: 4098-1191 PT Time Calculation (min) (ACUTE ONLY): 33 min   Charges:   PT Evaluation $PT Eval Low Complexity: 1 Low PT Treatments $Therapeutic Activity: 8-22 mins       Donna Bernard, PT, MPT   Ina Homes 01/26/2022, 12:20 PM

## 2022-01-26 NOTE — Progress Notes (Addendum)
[name] [facility] at [facility] NAME: [name] Gilbert    MR#:  [id number]  DATE OF BIRTH:  10/05/1960  SUBJECTIVE:   husband at bedside. Patient came in after she had a fall and weakness where leg right-sided gave out. She started complaining of right ankle pain. Was seen in the emergency room and was found to have acute left temporal CVA. Patient was admitted for further evaluation. Today complains of significant right ankle swelling post fall. X-rays s done today shows right distal fibula fracture.   VITALS:  Blood pressure (!) 172/91, pulse 75, temperature 98 F (36.7 C), resp. rate 18, height 5\' 2"  (1.575 m), weight 74.2 kg, SpO2 100 %.  PHYSICAL EXAMINATION:   GENERAL:  61 y.o.-year-old patient lying in the bed with no acute distress.  LUNGS: Normal breath sounds bilaterally, no wheezing, rales, rhonchi.  CARDIOVASCULAR: S1, S2 normal. No murmurs, rubs, or gallops.  ABDOMEN: Soft, nontender, nondistended. Bowel sounds present.  EXTREMITIES: right ankle swelling NEUROLOGIC: nonfocal  patient is alert and awake right sided hemiparesis SKIN: No obvious rash, lesion, or ulcer.   LABORATORY PANEL:  CBC Recent Labs  Lab 01/25/22 0611  WBC 7.5  HGB 12.2  HCT 36.9  PLT 244    Chemistries  Recent Labs  Lab 01/25/22 0611  NA 132*  K 4.2  CL 96*  CO2 27  GLUCOSE 261*  BUN 27*  CREATININE 1.71*  CALCIUM 8.5*  AST 27  ALT 14  ALKPHOS 99  BILITOT 0.9   Cardiac Enzymes No results for input(s): TROPONINI in the last 168 hours. RADIOLOGY:  DG Ankle Complete Right  Result Date: 01/26/2022 CLINICAL DATA:  Right ankle pain after fall 2 days ago EXAM: RIGHT ANKLE - COMPLETE 3+ VIEW COMPARISON:  None Available. FINDINGS: Acute obliquely oriented fracture of the distal fibular metaphysis with minimal lateral displacement. No fracture identified of the medial or posterior malleoli. Ankle mortise is congruent. No dislocation. Soft tissue  swelling about the ankle. IMPRESSION: Acute obliquely oriented fracture of the distal fibular metaphysis with minimal lateral displacement. Electronically Signed   By: Davina Poke D.O.   On: 01/26/2022 14:02   CT Head Wo Contrast  Result Date: 01/25/2022 CLINICAL DATA:  61 year old female status post fall with pain. Hypertensive, 0000000 systolic. Hyperglycemia. EXAM: CT HEAD WITHOUT CONTRAST TECHNIQUE: Contiguous axial images were obtained from the base of the skull through the vertex without intravenous contrast. RADIATION DOSE REDUCTION: This exam was performed according to the departmental dose-optimization program which includes automated exposure control, adjustment of the mA and/or kV according to patient size and/or use of iterative reconstruction technique. COMPARISON:  Head CT 11/01/2021 and earlier. FINDINGS: Brain: Advanced chronic ischemic disease. Multifocal cerebral encephalomalacia, maximal in the left medial parietal and occipital lobes. Fairly extensive superimposed chronic small vessel disease in the deep gray nuclei and brainstem. However, new broad-based area of cytotoxic edema in the left posterior and mesial temporal lobe (coronal image 39 versus series 4, image 40 in February). This could be acute or subacute. No hemorrhagic transformation or mass effect. Elsewhere gray-white matter differentiation is stable. No midline shift, mass effect, or evidence of intracranial mass lesion. No ventriculomegaly. Normal basilar cisterns. Vascular: Calcified atherosclerosis at the skull base. No suspicious intracranial vascular hyperdensity. Skull: Stable.  No acute osseous abnormality identified. Sinuses/Orbits: Visualized paranasal sinuses and mastoids are clear. Other: No acute orbit or scalp soft tissue injury. IMPRESSION: 1. Relatively large acute to subacute infarct of the  posterior and mesial Left temporal lobe, new since February. 2. No hemorrhagic transformation or significant mass effect. 3.  Otherwise stable advanced underlying chronic ischemic disease. 4. No acute traumatic injury identified. Electronically Signed   By: Genevie Ann M.D.   On: 01/25/2022 06:38   CT Cervical Spine Wo Contrast  Result Date: 01/25/2022 CLINICAL DATA:  Study identified as missing a report at 9:19 am on 01/25/2022. 61 year old female status post fall with pain. Hypertensive, 0000000 systolic. Hyperglycemia. EXAM: CT CERVICAL SPINE WITHOUT CONTRAST TECHNIQUE: Multidetector CT imaging of the cervical spine was performed without intravenous contrast. Multiplanar CT image reconstructions were also generated. RADIATION DOSE REDUCTION: This exam was performed according to the departmental dose-optimization program which includes automated exposure control, adjustment of the mA and/or kV according to patient size and/or use of iterative reconstruction technique. COMPARISON:  Head CT the same day reported separately. FINDINGS: Alignment: Straightening of cervical lordosis. Cervicothoracic junction alignment is within normal limits. Bilateral posterior element alignment is within normal limits. Skull base and vertebrae: Visualized skull base is intact. No atlanto-occipital dissociation. C1 and C2 appear intact and aligned. No acute osseous abnormality identified. Soft tissues and spinal canal: No prevertebral fluid or swelling. No visible canal hematoma. Negative noncontrast visible neck soft tissues, mild carotid calcified atherosclerosis. Disc levels: Mild for age cervical spine degeneration, most pronounced at C5-C6. Upper chest: Negative. IMPRESSION: 1. No acute traumatic injury identified in the cervical spine. 2. Mild for age cervical spine degeneration. Electronically Signed   By: Genevie Ann M.D.   On: 01/25/2022 09:21   MR ANGIO HEAD WO CONTRAST  Result Date: 01/25/2022 CLINICAL DATA:  Neuro deficit, acute, stroke suspected; Vertebral artery aneurysm suspected EXAM: MRI HEAD WITHOUT CONTRAST MRA HEAD WITHOUT CONTRAST MRA OF THE  NECK WITHOUT AND WITH CONTRAST TECHNIQUE: Multiplanar, multi-echo pulse sequences of the brain and surrounding structures were acquired without intravenous contrast. Angiographic images of the Circle of Willis were acquired using MRA technique without intravenous contrast. Angiographic images of the neck were acquired using MRA technique without and with intravenous contrast. Carotid stenosis measurements (when applicable) are obtained utilizing NASCET criteria, using the distal internal carotid diameter as the denominator. CONTRAST:  7.32mL GADAVIST GADOBUTROL 1 MMOL/ML IV SOLN COMPARISON:  CT head Jan 25, 2022. MRA FINDINGS: MR HEAD FINDINGS Brain: Confluent acute infarct in the medial left temporal lobe with additional punctate infarct in the left thalamus. Associated edema without significant mass effect. Additional patchy T2/FLAIR hyperintensities in the white matter and pons, nonspecific but both the microvascular disease. Remote infarcts in bilateral parieto-occipital lobes. Vascular: Detailed below. Skull and upper cervical spine: Normal marrow signal. Sinuses/Orbits: Clear sinuses.  No acute orbital findings. Other: No mastoid effusions. MRA HEAD FINDINGS Anterior circulation: Bilateral intracranial ICAs, MCAs, and ACAs are patent. Similar severe distal M1/proximal M2 MCA stenosis bilaterally. Similar absent left A1 ACA with prominent right A1 ACA, probably congenital/anatomic variant. Similar mild-to-moderate right A1 ACA stenosis. Posterior circulation: Bilateral intradural vertebral arteries, basilar artery and right posterior cerebral artery are patent without significant proximal stenosis. Left P1 PCA is a occluded with absence of flow related signal distally. MRA NECK FINDINGS Motion limited study.  Within this limitation: Aortic arch: Great vessel origins are patent. Right carotid system: Patent without evidence of significant (greater than 50%) stenosis. Left carotid system: Patent without evidence  of significant (greater than 50%) stenosis. Vertebral arteries: Patent bilaterally without evidence of significant (greater than 50%) stenosis. IMPRESSION: MRI: 1. Confluent acute infarct in the medial left temporal  lobe with additional punctate infarct in the left thalamus. Associated edema without significant mass effect. 2. Remote infarcts and moderate chronic microvascular disease. MRA Head: 1. Left P1 PCA occlusion, previously severely stenotic. 2. Similar severe distal M1/proximal M2 MCA stenosis bilaterally. Similar mild to moderate right A1 ACA stenosis. MRA Neck: No evidence of significant (greater than 50%) stenosis. Findings were communicated on 01/25/2022 at 11:01 am to provider Blaine Hamper via telephone, who verbally acknowledged these results. Electronically Signed   By: Margaretha Sheffield M.D.   On: 01/25/2022 11:04   MR ANGIO NECK W WO CONTRAST  Result Date: 01/25/2022 CLINICAL DATA:  Neuro deficit, acute, stroke suspected; Vertebral artery aneurysm suspected EXAM: MRI HEAD WITHOUT CONTRAST MRA HEAD WITHOUT CONTRAST MRA OF THE NECK WITHOUT AND WITH CONTRAST TECHNIQUE: Multiplanar, multi-echo pulse sequences of the brain and surrounding structures were acquired without intravenous contrast. Angiographic images of the Circle of Willis were acquired using MRA technique without intravenous contrast. Angiographic images of the neck were acquired using MRA technique without and with intravenous contrast. Carotid stenosis measurements (when applicable) are obtained utilizing NASCET criteria, using the distal internal carotid diameter as the denominator. CONTRAST:  7.54mL GADAVIST GADOBUTROL 1 MMOL/ML IV SOLN COMPARISON:  CT head Jan 25, 2022. MRA FINDINGS: MR HEAD FINDINGS Brain: Confluent acute infarct in the medial left temporal lobe with additional punctate infarct in the left thalamus. Associated edema without significant mass effect. Additional patchy T2/FLAIR hyperintensities in the white matter and pons,  nonspecific but both the microvascular disease. Remote infarcts in bilateral parieto-occipital lobes. Vascular: Detailed below. Skull and upper cervical spine: Normal marrow signal. Sinuses/Orbits: Clear sinuses.  No acute orbital findings. Other: No mastoid effusions. MRA HEAD FINDINGS Anterior circulation: Bilateral intracranial ICAs, MCAs, and ACAs are patent. Similar severe distal M1/proximal M2 MCA stenosis bilaterally. Similar absent left A1 ACA with prominent right A1 ACA, probably congenital/anatomic variant. Similar mild-to-moderate right A1 ACA stenosis. Posterior circulation: Bilateral intradural vertebral arteries, basilar artery and right posterior cerebral artery are patent without significant proximal stenosis. Left P1 PCA is a occluded with absence of flow related signal distally. MRA NECK FINDINGS Motion limited study.  Within this limitation: Aortic arch: Great vessel origins are patent. Right carotid system: Patent without evidence of significant (greater than 50%) stenosis. Left carotid system: Patent without evidence of significant (greater than 50%) stenosis. Vertebral arteries: Patent bilaterally without evidence of significant (greater than 50%) stenosis. IMPRESSION: MRI: 1. Confluent acute infarct in the medial left temporal lobe with additional punctate infarct in the left thalamus. Associated edema without significant mass effect. 2. Remote infarcts and moderate chronic microvascular disease. MRA Head: 1. Left P1 PCA occlusion, previously severely stenotic. 2. Similar severe distal M1/proximal M2 MCA stenosis bilaterally. Similar mild to moderate right A1 ACA stenosis. MRA Neck: No evidence of significant (greater than 50%) stenosis. Findings were communicated on 01/25/2022 at 11:01 am to provider Blaine Hamper via telephone, who verbally acknowledged these results. Electronically Signed   By: Margaretha [facility] M.D.   On: 01/25/2022 11:04   MR BRAIN WO CONTRAST  Result Date: 01/25/2022 CLINICAL  DATA:  Neuro deficit, acute, stroke suspected; Vertebral artery aneurysm suspected EXAM: MRI HEAD WITHOUT CONTRAST MRA HEAD WITHOUT CONTRAST MRA OF THE NECK WITHOUT AND WITH CONTRAST TECHNIQUE: Multiplanar, multi-echo pulse sequences of the brain and surrounding structures were acquired without intravenous contrast. Angiographic images of the Circle of Willis were acquired using MRA technique without intravenous contrast. Angiographic images of the neck were acquired using MRA technique without and  with intravenous contrast. Carotid stenosis measurements (when applicable) are obtained utilizing NASCET criteria, using the distal internal carotid diameter as the denominator. CONTRAST:  7.28mL GADAVIST GADOBUTROL 1 MMOL/ML IV SOLN COMPARISON:  CT head Jan 25, 2022. MRA FINDINGS: MR HEAD FINDINGS Brain: Confluent acute infarct in the medial left temporal lobe with additional punctate infarct in the left thalamus. Associated edema without significant mass effect. Additional patchy T2/FLAIR hyperintensities in the white matter and pons, nonspecific but both the microvascular disease. Remote infarcts in bilateral parieto-occipital lobes. Vascular: Detailed below. Skull and upper cervical spine: Normal marrow signal. Sinuses/Orbits: Clear sinuses.  No acute orbital findings. Other: No mastoid effusions. MRA HEAD FINDINGS Anterior circulation: Bilateral intracranial ICAs, MCAs, and ACAs are patent. Similar severe distal M1/proximal M2 MCA stenosis bilaterally. Similar absent left A1 ACA with prominent right A1 ACA, probably congenital/anatomic variant. Similar mild-to-moderate right A1 ACA stenosis. Posterior circulation: Bilateral intradural vertebral arteries, basilar artery and right posterior cerebral artery are patent without significant proximal stenosis. Left P1 PCA is a occluded with absence of flow related signal distally. MRA NECK FINDINGS Motion limited study.  Within this limitation: Aortic arch: Great vessel  origins are patent. Right carotid system: Patent without evidence of significant (greater than 50%) stenosis. Left carotid system: Patent without evidence of significant (greater than 50%) stenosis. Vertebral arteries: Patent bilaterally without evidence of significant (greater than 50%) stenosis. IMPRESSION: MRI: 1. Confluent acute infarct in the medial left temporal lobe with additional punctate infarct in the left thalamus. Associated edema without significant mass effect. 2. Remote infarcts and moderate chronic microvascular disease. MRA Head: 1. Left P1 PCA occlusion, previously severely stenotic. 2. Similar severe distal M1/proximal M2 MCA stenosis bilaterally. Similar mild to moderate right A1 ACA stenosis. MRA Neck: No evidence of significant (greater than 50%) stenosis. Findings were communicated on 01/25/2022 at 11:01 am to provider Blaine Hamper via telephone, who verbally acknowledged these results. Electronically Signed   By: Margaretha Sheffield M.D.   On: 01/25/2022 11:04   DG Chest Portable 1 View  Result Date: 01/25/2022 CLINICAL DATA:  BIB [facility] from home due to fall X 1 hour ago, new left lower back pain and generalized weakness (for last few days). Baseline left sided deficits from previous stroke. Hypertensive, 204/98. CBG 201 Unable to tell RN what caused fall. EXAM: PORTABLE CHEST - 1 VIEW COMPARISON:  10/02/2019 FINDINGS: Lungs are clear. Heart size and mediastinal contours are within normal limits. No effusion.  No pneumothorax. Visualized bones unremarkable. IMPRESSION: No acute cardiopulmonary disease. Electronically Signed   By: Lucrezia Europe M.D.   On: 01/25/2022 06:38   ECHOCARDIOGRAM COMPLETE  Result Date: 01/25/2022    ECHOCARDIOGRAM REPORT   Patient Name:   [name] Gilbert Date of Exam: 01/25/2022 Medical Rec #:  [id number]        Height:       62.0 in Accession #:    [id number]       Weight:       180.8 lb Date of Birth:  Aug 08, 1961        BSA:          1.831 m Patient Age:    28 years          BP:           158/85 mmHg Patient Gender: F                HR:           96 bpm. Exam Location:  [facility]  Procedure: 2D Echo, Cardiac Doppler and Color Doppler Indications:     Stroke I63.9  History:         Patient has prior history of Echocardiogram examinations, most                  recent 06/13/2021. Stroke; Risk Factors:Hypertension and                  Diabetes.  Sonographer:     Sherrie Sport Referring Phys:  [id number] Soledad Gerlach NIU Diagnosing Phys: Yolonda Kida MD  Sonographer Comments: Suboptimal apical window and no subcostal window. IMPRESSIONS  1. Left ventricular ejection fraction, by estimation, is 60 to 65%. The left ventricle has normal function. The left ventricle has no regional wall motion abnormalities. Left ventricular diastolic parameters are consistent with Grade I diastolic dysfunction (impaired relaxation).  2. Right ventricular systolic function is normal. The right ventricular size is normal.  3. The mitral valve is normal in structure. Trivial mitral valve regurgitation.  4. The aortic valve is normal in structure. Aortic valve regurgitation is not visualized. FINDINGS  Left Ventricle: Left ventricular ejection fraction, by estimation, is 60 to 65%. The left ventricle has normal function. The left ventricle has no regional wall motion abnormalities. The left ventricular internal cavity size was normal in size. There is  borderline left ventricular hypertrophy. Left ventricular diastolic parameters are consistent with Grade I diastolic dysfunction (impaired relaxation). Right Ventricle: The right ventricular size is normal. No increase in right ventricular wall thickness. Right ventricular systolic function is normal. Left Atrium: Left atrial size was normal in size. Right Atrium: Right atrial size was normal in size. Pericardium: There is no evidence of pericardial effusion. Mitral Valve: The mitral valve is normal in structure. Trivial mitral valve regurgitation. MV peak gradient, 4.7 mmHg. The  mean mitral valve gradient is 2.0 mmHg. Tricuspid Valve: The tricuspid valve is normal in structure. Tricuspid valve regurgitation is trivial. Aortic Valve: The aortic valve is normal in structure. Aortic valve regurgitation is not visualized. Aortic valve mean gradient measures 3.0 mmHg. Aortic valve peak gradient measures 5.0 mmHg. Aortic valve area, by VTI measures 2.60 cm. Pulmonic Valve: The pulmonic valve was normal in structure. Pulmonic valve regurgitation is not visualized. Aorta: The ascending aorta was not well visualized. IAS/Shunts: No atrial level shunt detected by color flow Doppler.  LEFT VENTRICLE PLAX 2D LVIDd:         3.30 cm   Diastology LVIDs:         2.10 cm   LV e' medial:    4.57 cm/s LV PW:         1.20 cm   LV E/e' medial:  11.1 LV IVS:        0.90 cm   LV e' lateral:   6.31 cm/s LVOT diam:     2.00 cm   LV E/e' lateral: 8.0 LV SV:         48 LV SV Index:   26 LVOT Area:     3.14 cm  RIGHT VENTRICLE RV Basal diam:  3.10 cm RV S prime:     13.60 cm/s TAPSE (M-mode): 2.0 cm LEFT ATRIUM             Index        RIGHT ATRIUM           Index LA diam:        2.70 cm 1.47 cm/m   RA Area:  10.70 cm LA Vol (A2C):   31.3 ml 17.09 ml/m  RA Volume:   23.80 ml  13.00 ml/m LA Vol (A4C):   21.7 ml 11.85 ml/m LA Biplane Vol: 26.3 ml 14.36 ml/m  AORTIC VALVE                    PULMONIC VALVE AV Area (Vmax):    2.56 cm     RVOT Peak grad: 8 mmHg AV Area (Vmean):   2.56 cm AV Area (VTI):     2.60 cm AV Vmax:           112.00 cm/s AV Vmean:          84.200 cm/s AV VTI:            0.186 m AV Peak Grad:      5.0 mmHg AV Mean Grad:      3.0 mmHg LVOT Vmax:         91.40 cm/s LVOT Vmean:        68.700 cm/s LVOT VTI:          0.154 m LVOT/AV VTI ratio: 0.83  AORTA Ao Root diam: 2.40 cm MITRAL VALVE                TRICUSPID VALVE MV Area (PHT): 3.91 cm     TR Peak grad:   11.3 mmHg MV Area VTI:   3.04 cm     TR Vmax:        168.00 cm/s MV Peak grad:  4.7 mmHg MV Mean grad:  2.0 mmHg     SHUNTS MV  Vmax:       1.08 m/s     Systemic VTI:  0.15 m MV Vmean:      59.3 cm/s    Systemic Diam: 2.00 cm MV Decel Time: 194 msec     Pulmonic VTI:  0.236 m MV E velocity: 50.60 cm/s MV A velocity: 104.00 cm/s MV E/A ratio:  0.49 Dwayne D Callwood MD Electronically signed by Yolonda Kida MD Signature Date/Time: 01/25/2022/8:28:00 PM    Final    DG Hip Unilat W or Wo Pelvis 2-3 Views Left  Result Date: 01/25/2022 CLINICAL DATA:  fall EXAM: DG HIP (WITH OR WITHOUT PELVIS) 2-3V LEFT COMPARISON:  CT 06/12/2021 FINDINGS: There is no evidence of hip fracture or dislocation. Mild bilateral hip DJD. Iliofemoral arterial calcifications. Bilateral pelvic phleboliths. IMPRESSION: Mild bilateral hip DJD.  No acute findings. Electronically Signed   By: Lucrezia Europe M.D.   On: 01/25/2022 06:40    Assessment and Plan  [name] Gilbert is a 61 y.o. 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  Pt states that she had history of 3 strokes in the past.  She has chronic left-sided weakness.  She states that she has been feeling weaker in the past 2 days.  She feels like her left-sided weakness has worsened. She fell in the early morning. No LOC per her husband.   CT-head: 1. Relatively large acute to subacute infarct of the posterior and mesial Left temporal lobe, new since February. 2. No hemorrhagic transformation or significant mass effect. 3. Otherwise stable advanced underlying chronic ischemic disease. 4. No acute traumatic injury identified  MRI:  1. Confluent acute infarct in the medial left temporal lobe with additional punctate infarct in the left thalamus. Associated edema without significant  mass effect. 2. Remote infarcts and moderate chronic microvascular disease.  MRA Head:  1. Left P1 PCA occlusion, previously severely stenotic. 2.  Similar severe distal M1/proximal M2 MCA stenosis bilaterally. Similar mild to moderate right A1 ACA stenosis.  MRA Neck:  No evidence of significant (greater than 50%) stenosis  Acute CVA recurrent, left temporal lobe -- Patient states that she had 3 strokes in the past.  Patient has chronic left-sided weakness, which has worsened.   --CT head showed relatively large acute to subacute infarct of the posterior and mesial Left temporal lobe, which is new since February. -- Seen by physical therapy recommends rehab -- seen by neurology recommends Aspirin - dose 81mg  and plavix 75mg  daily  for 90 days. Patient got loading dose of Plavix  Right distal fibular oblique fracture status post fall -- PRN pain meds -- orthopedic consultation with Dr. Loyal Gambler. 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  >50% time spent on counselling and coordination of care  Note: This dictation was prepared with Dragon dictation along with smaller phrase technology. Any transcriptional errors that result from this process are unintentional.  [name] M.D    [name]   CC: Primary care physician; Christella Scheuermann, MD

## 2022-01-27 ENCOUNTER — Inpatient Hospital Stay: Payer: Medicare HMO

## 2022-01-27 DIAGNOSIS — S82831A Other fracture of upper and lower end of right fibula, initial encounter for closed fracture: Secondary | ICD-10-CM | POA: Diagnosis not present

## 2022-01-27 DIAGNOSIS — Z794 Long term (current) use of insulin: Secondary | ICD-10-CM

## 2022-01-27 DIAGNOSIS — E1122 Type 2 diabetes mellitus with diabetic chronic kidney disease: Secondary | ICD-10-CM

## 2022-01-27 DIAGNOSIS — N183 Chronic kidney disease, stage 3 unspecified: Secondary | ICD-10-CM

## 2022-01-27 DIAGNOSIS — I639 Cerebral infarction, unspecified: Secondary | ICD-10-CM | POA: Diagnosis not present

## 2022-01-27 DIAGNOSIS — I10 Essential (primary) hypertension: Secondary | ICD-10-CM | POA: Diagnosis not present

## 2022-01-27 DIAGNOSIS — I5032 Chronic diastolic (congestive) heart failure: Secondary | ICD-10-CM | POA: Diagnosis not present

## 2022-01-27 LAB — GLUCOSE, CAPILLARY
Glucose-Capillary: 77 mg/dL (ref 70–99)
Glucose-Capillary: 94 mg/dL (ref 70–99)
Glucose-Capillary: 86 mg/dL (ref 70–99)
Glucose-Capillary: 89 mg/dL (ref 70–99)
Glucose-Capillary: 201 mg/dL — ABNORMAL HIGH (ref 70–99)

## 2022-01-27 MED ORDER — INSULIN GLARGINE-YFGN 100 UNIT/ML ~~LOC~~ SOLN
10.0000 [IU] | Freq: Every day | SUBCUTANEOUS | Status: DC
  Filled 2022-01-27: qty 0.1

## 2022-01-27 MED ORDER — ROSUVASTATIN CALCIUM 20 MG PO TABS
40.0000 mg | ORAL_TABLET | Freq: Every day | ORAL | Status: DC
Start: 2022-01-27 — End: 2022-02-02
  Administered 2022-01-27 – 2022-02-01 (×6): 40 mg via ORAL
  Filled 2022-01-27 (×4): qty 2
  Filled 2022-01-27 (×2): qty 4

## 2022-01-27 MED ORDER — SODIUM CHLORIDE 0.9 % IV BOLUS
500.0000 mL | Freq: Once | INTRAVENOUS | Status: AC
  Administered 2022-01-27: 500 mL via INTRAVENOUS

## 2022-01-27 MED ORDER — SODIUM CHLORIDE 0.9 % IV SOLN: INTRAVENOUS | Status: AC

## 2022-01-27 NOTE — Progress Notes (Signed)
PT noted some right side changes on the pt, RRT was called, MD was notified and at Bedside to assess pt. Pt was taken to plain stat head CT with impression  Evolving recent infarcts of the left temporal and occipital lobes and left thalamus with mild mass effect and no hemorrhage.

## 2022-01-27 NOTE — Progress Notes (Signed)
PT Cancellation Note  Patient Details Name: [name] Gilbert MRN: [id number] DOB: [birth year]   Cancelled Treatment:    Reason Eval/Treat Not Completed: Medical issues which prohibited therapy.  Pt resting in bed upon PT arrival.  Upon therapist assessment of pt's strength, increased R UE weakness noted compared to L UE.  D/t concern for change in status, therapist then contacted OT (who had previously seen pt) to clarify if pt had a change in status; OT immediately came to pt's bedside for assessment and reporting change in status.  Therapist then immediately contacted pt's nurse regarding change in status concerns and nurse immediately came to assess pt.  Plan for STAT head CT.  Will monitor pt's status and re-attempt PT session at a later date/time as medically appropriate.  Leitha Bleak, PT 01/27/2022, 2:10 PM

## 2022-01-27 NOTE — Progress Notes (Signed)
Patient is asleep and displays no symptoms of distress or discomfort. Patient is resting well. No changes to assessment. Neuros are the same NIH 1. Will continue to monitor. VS to be collected again at 0616  01/27/22 0416  Assess: MEWS Score  Temp 98 F (36.7 C)  BP 94/60  Pulse Rate 70  ECG Heart Rate 70  Resp 12  SpO2 99 %  O2 Device Room Air  Assess: MEWS Score  MEWS Temp 0  MEWS Systolic 1  MEWS Pulse 0  MEWS RR 1  MEWS LOC 0  MEWS Score 2  MEWS Score Color Yellow  Assess: if the MEWS score is Yellow or Red  Were vital signs taken at a resting state? Yes  Focused Assessment No change from prior assessment  Does the patient meet 2 or more of the SIRS criteria? No  MEWS guidelines implemented *See Row Information* Yes  Treat  MEWS Interventions Other (Comment) (repositioned and needs addressed)  Pain Scale 0-10  Pain Score 0  Take Vital Signs  Increase Vital Sign Frequency  Yellow: Q 2hr X 2 then Q 4hr X 2, if remains yellow, continue Q 4hrs  Escalate  MEWS: Escalate Yellow: discuss with charge nurse/RN and consider discussing with provider and RRT  Notify: Charge Nurse/RN  Name of Charge Nurse/RN Notified Christin RN  Date Charge Nurse/RN Notified 01/27/22  Time Charge Nurse/RN Notified 0430  Document  Patient Outcome Other (Comment) (stable;no distress;sleeping)  Progress note created (see row info) Yes  Assess: SIRS CRITERIA  SIRS Temperature  0  SIRS Pulse 0  SIRS Respirations  0  SIRS WBC 0  SIRS Score Sum  0

## 2022-01-27 NOTE — Progress Notes (Signed)
RN notified by PT of change in patient neurological status with increased weakness and coolness to right side. MD and Neurology notified, both arriving at bedside. Head CT ordered STAT. Patient VSS, CBG 86, AxO.

## 2022-01-27 NOTE — Progress Notes (Signed)
SLP Cancellation Note  Patient Details Name: Emmelia Holdsworth MRN: [id number] DOB: [birth year]   Cancelled treatment:       Reason Eval/Treat Not Completed:  (chart reviewed; noted pt at CT) Per MD note, repeat head Imaging being done today d/t patient having increasing weakness of Right arm; c/o right lower extremity pain and intermittent dysarthria. Pt has been seen by ST services this admit. Per SLP eval note, "Pt presents with mild to moderate expressive and receptive deficits. Husband previously reported that speech is at [facility] from previous strokes.". Per evaluation recommendations, "Pt may benefit from ST services at discharge for high level expressive and receptive tasks.".  ST services will monitor pt's status while admitted for any change/decline in communication status from her Baseline. Recommend f/u w/ ST services at Discharge to address communication needs in ADLs as was recommended at the evaluation. MD updated.       Jerilynn Som, MS, CCC-SLP Speech Language Pathologist Rehab Services; [facility] [phone] (ascom) Emyah Roznowski 01/27/2022, 2:45 PM

## 2022-01-27 NOTE — Care Management Important Message (Signed)
Important Message  Patient Details  Name: [name] Gilbert MRN: [id number] Date of Birth: 24-Jun-1961   Medicare Important Message Given:  N/A - LOS <3 / Initial given by admissions     Johnell Comings 01/27/2022, 8:31 AM

## 2022-01-27 NOTE — Progress Notes (Signed)
Patient ID: [name] Gilbert, female   DOB: [birth year], 61 y.o.   MRN: [id number]  Subjective: Overall, the patient has no new complaints.  She feels that her ankle symptoms are improved in her splint, although she still has some discomfort.   Objective: Vital signs in last 24 hours: Temp:  [97.9 F (36.6 C)-98.3 F (36.8 C)] 97.9 F (36.6 C) (05/19 0616) Pulse Rate:  [70-80] 73 (05/19 0616) Resp:  [8-18] 8 (05/19 0616) BP: (94-172)/(59-91) 113/59 (05/19 0616) SpO2:  [99 %-100 %] 99 % (05/19 0616)  Intake/Output from previous day: 05/18 0701 - 05/19 0700 In: 509.3 [P.O.:480; I.V.:29.3] Out: 1900 [Urine:1900] Intake/Output this shift: No intake/output data recorded.  Recent Labs    01/25/22 0611  HGB 12.2   Recent Labs    01/25/22 0611  WBC 7.5  RBC 4.36  HCT 36.9  PLT 244   Recent Labs    01/25/22 0611  NA 132*  K 4.2  CL 96*  CO2 27  BUN 27*  CREATININE 1.71*  GLUCOSE 261*  CALCIUM 8.5*   Recent Labs    01/25/22 0611  INR 1.0    Physical Exam: Orthopedic examination is limited to the right foot and lower leg.  The splint appears to be dry and intact.  Her skin is intact at the proximal and distal margins of the splint.  She has good capillary refill to her toes.  Sensation and active motion of her toes are decreased/unchanged as compared to yesterday, presumably due to her prior strokes.  Assessment: Status post right distal fibular fracture.  Plan: The patient may start formal physical therapy, so long as she remains nonweightbearing on the right leg.  This may be difficult due to her recent stroke affecting her left side.  Most likely, the patient will require short-term rehab placement, although this final determination will be based on how she does with therapy.  Thank you for asking me to participate in the care of this most pleasant yet unfortunate woman.  I will sign off at this time.  If you have further need of orthopedic input during this  hospitalization, please reconsult me.  Otherwise, I will plan on seeing the patient back in our office in 7 to 10 days for re-evaluation, repeat x-ray, and probable application of a short leg cast.   Excell Seltzer Christl Fessenden 01/27/2022, 7:45 AM

## 2022-01-27 NOTE — Progress Notes (Signed)
Subjective: Called regarding some worsening of right sided weakness today  Exam: Vitals:   01/27/22 1230 01/27/22 1551  BP:  115/64  Pulse:  69  Resp:  19  Temp:  99.2 F (37.3 C)  SpO2: 100% 99%   Gen: In bed, NAD Resp: non-labored breathing, no acute distress Abd: soft, nt  Neuro: MS: awake, alert, interactive and appropriate CN:R hemianopia., does not show teeth well when asked.  Motor: she has significant worsening of the right arm, she is not able lift against gravity, very minimal wrist extension or hand flexion.  She does have some elbow extension 4/5.  Pertinent Labs: LDL 214  Impression: 61 year old female with some fluctuation in the setting of recent stroke.  We will give fluid bolus, and reduce antihypertensives to help support her.  Recommendations: 1) normal saline 500 cc followed by 75 cc an hour 2) reduce anti-hypertensives, likley will need a longer period of permissive hypertension than typical 3) neurology will continue to follow.   Ritta Slot, MD [facility] [phone]  If 7pm- 7am, please page neurology on call as listed in AMION.

## 2022-01-27 NOTE — NC FL2 (Signed)
[place] LEVEL OF CARE SCREENING TOOL     IDENTIFICATION  Patient Name: [name] Gilbert Birthdate: 09/08/1961 Sex: female Admission Date (Current Location): 01/25/2022  Seba Dalk[place] and [place] Number:  Engineering geologist and Address:  [facility], [address], [place], [place] [address]      Provider Number:    Attending Physician Name and Address:  [name], MD  Relative Name and Phone Number:  Herbie Baltimore Day [phone]    Current Level of Care: Hospital Recommended Level of Care: [place] Prior Approval Number:    Date Approved/Denied:   PASRR Number: [id number]  Discharge Plan: SNF    Current Diagnoses: Patient Active Problem List   Diagnosis Date Noted   Closed fracture of right distal fibula    Stroke ([place]) 01/25/2022   HLD (hyperlipidemia) 01/25/2022   Type II diabetes mellitus with renal manifestations ([place]) 01/25/2022   Stage 3b chronic kidney disease (CKD) ([place]) 01/25/2022   Chronic diastolic CHF (congestive heart failure) ([place]) 01/25/2022   Hypertensive emergency 01/25/2022   Elevated troponin 01/25/2022   Fall    Neurologic abnormality 06/13/2021   Hypoglycemia 06/13/2021   Hypothermia 06/13/2021   Dehydration 06/12/2021   Headache 06/12/2021   Abdominal pain 06/12/2021   Hypoglycemia associated with diabetes ([place]) 06/12/2021   Acute respiratory failure with hypoxia (HCC)    HHNC (hyperglycemic hyperosmolar nonketotic coma) ([place]) 10/01/2019   HTN (hypertension) 11/01/2018   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    Orientation RESPIRATION BLADDER Height & Weight     Self  Normal External catheter Weight: 74.2 kg Height:  5\' 2"  (157.5 cm)  BEHAVIORAL SYMPTOMS/MOOD NEUROLOGICAL BOWEL NUTRITION STATUS      Continent Diet (Heart healthy  Carb Modified)  AMBULATORY STATUS COMMUNICATION OF NEEDS Skin   Limited Assist Verbally Normal                       Personal Care Assistance Level of Assistance  Bathing, Dressing, Feeding Bathing Assistance: Limited assistance Feeding assistance: Limited assistance Dressing Assistance: Limited assistance     Functional Limitations Info  Speech     Speech Info: Impaired    SPECIAL CARE FACTORS FREQUENCY  PT (By licensed PT), OT (By licensed OT)     PT Frequency: Greater than 2x per week OT Frequency: Greater than 3x per week            Contractures Contractures Info: Not present    Additional Factors Info  Code Status, Allergies Code Status Info: Full Allergies Info: Metformin, Penicillins, Codeine, Hydralazine, Statins           Current Medications (01/27/2022):  This is the current hospital active medication list Current Facility-Administered Medications  Medication Dose Route Frequency Provider Last Rate Last Admin    stroke: early stages of recovery book   Does not apply Once [name], MD       acetaminophen (TYLENOL) tablet 650 mg  650 mg Oral Q4H PRN [name], MD       Or   acetaminophen (TYLENOL) 160 MG/5ML solution 650 mg  650 mg Per Tube Q4H PRN [name], MD       Or   acetaminophen (TYLENOL) suppository 650 mg  650 mg Rectal Q4H PRN [name], MD       aspirin chewable tablet 81 mg  81  mg Oral q morning [name], MD   81 mg at 01/27/22 1031   carvedilol (COREG) tablet 25 mg  25 mg Oral BID [name], MD   25 mg at 01/27/22 1030   clopidogrel (PLAVIX) tablet 75 mg  75 mg Oral Daily [name], MD   75 mg at 01/27/22 1030   enoxaparin (LOVENOX) injection 40 mg  40 mg Subcutaneous Q24H [name], MD   40 mg at 01/26/22 2113   HYDROcodone-acetaminophen (NORCO) 7.5-325 MG per tablet 1-2 tablet  1-2 tablet Oral Q6H PRN [name], MD   2 tablet at 01/26/22 2114   insulin aspart (novoLOG) injection 0-5 Units  0-5 Units  Subcutaneous QHS [name], MD   3 Units at 01/26/22 2113   insulin aspart (novoLOG) injection 0-9 Units  0-9 Units Subcutaneous TID WC [name], MD   7 Units at 01/26/22 1629   insulin glargine-yfgn (SEMGLEE) injection 10 Units  10 Units Subcutaneous QHS [name], MD       levETIRAcetam (KEPPRA) tablet 250 mg  250 mg Oral BID [name], MD   250 mg at 01/27/22 1031   ondansetron (ZOFRAN) injection 4 mg  4 mg Intravenous Q8H PRN [name], MD       pregabalin (LYRICA) capsule 75 mg  75 mg Oral BID [name], MD   75 mg at 01/27/22 1030   rosuvastatin (CRESTOR) tablet 40 mg  40 mg Oral QHS [name], MD       senna-docusate (Senokot-S) tablet 1 tablet  1 tablet Oral QHS PRN [name], MD   1 tablet at 01/26/22 2114   spironolactone (ALDACTONE) tablet 150 mg  150 mg Oral Daily [name], MD   150 mg at 01/27/22 1030     Discharge Medications: Please see discharge summary for a list of discharge medications.  Relevant Imaging Results:  Relevant Lab Results:   Additional Information Patient's SS# [id number]  Laurena Slimmer, RN

## 2022-01-27 NOTE — Progress Notes (Signed)
OT Cancellation Note  Patient Details Name: [name] Gilbert MRN: [id number] DOB: 1961/08/27   Cancelled Treatment:    Reason Eval/Treat Not Completed: Medical issues which prohibited therapy. Chart reviewed. Call received from PT to verify change in status - pt presents with significantly increased RUE weakness compared to evaluation on 01/25/22. RN notified and MD in to assess. Will complete re-evaluation next session.   Kathie Dike, M.S. OTR/L  01/27/22, 3:18 PM  ascom [phone]

## 2022-01-27 NOTE — Progress Notes (Addendum)
[facility] [place] NAME: [name] Gilbert    MR#:  [id number]  DATE OF BIRTH:  July 04, 1961  SUBJECTIVE:   patient has increasing weakness right arm. Complains of right lower extremity pain. Very soft-spoken. Has intermittent dysarthria. No family at bedside  VITALS:  Blood pressure (!) 103/57, pulse 71, temperature (!) 96.9 F (36.1 C), resp. rate 17, height 5\' 2"  (1.575 m), weight 74.2 kg, SpO2 100 %.  PHYSICAL EXAMINATION:   GENERAL:  61 y.o.-year-old patient lying in the bed with no acute distress. Deconditioned LUNGS: Normal breath sounds bilaterally, no wheezing, rales, rhonchi.  CARDIOVASCULAR: S1, S2 normal. No murmurs, rubs, or gallops.  ABDOMEN: Soft, nontender, nondistended. Bowel sounds present.  EXTREMITIES: right leg splint+ NEUROLOGIC: nonfocal  patient is alert and awake right sided hemiparesis with more prominent right upper extremity weakness   LABORATORY PANEL:  CBC Recent Labs  Lab 01/25/22 0611  WBC 7.5  HGB 12.2  HCT 36.9  PLT 244     Chemistries  Recent Labs  Lab 01/25/22 0611  NA 132*  K 4.2  CL 96*  CO2 27  GLUCOSE 261*  BUN 27*  CREATININE 1.71*  CALCIUM 8.5*  AST 27  ALT 14  ALKPHOS 99  BILITOT 0.9    Cardiac Enzymes No results for input(s): TROPONINI in the last 168 hours. RADIOLOGY:  DG Ankle Complete Right  Result Date: 01/26/2022 CLINICAL DATA:  Right ankle pain after fall 2 days ago EXAM: RIGHT ANKLE - COMPLETE 3+ VIEW COMPARISON:  None Available. FINDINGS: Acute obliquely oriented fracture of the distal fibular metaphysis with minimal lateral displacement. No fracture identified of the medial or posterior malleoli. Ankle mortise is congruent. No dislocation. Soft tissue swelling about the ankle. IMPRESSION: Acute obliquely oriented fracture of the distal fibular metaphysis with minimal lateral displacement. Electronically Signed   By: Davina Poke D.O.   On: 01/26/2022 14:02    ECHOCARDIOGRAM COMPLETE  Result Date: 01/25/2022    ECHOCARDIOGRAM REPORT   Patient Name:   [name] Gilbert Date of Exam: 01/25/2022 Medical Rec #:  [id number]        Height:       62.0 in Accession #:    [id number]       Weight:       180.8 lb Date of Birth:  July 07, 1961        BSA:          1.831 m Patient Age:    61 years         BP:           158/85 mmHg Patient Gender: F                HR:           96 bpm. Exam Location:  [facility] Procedure: 2D Echo, Cardiac Doppler and Color Doppler Indications:     Stroke I63.9  History:         Patient has prior history of Echocardiogram examinations, most                  recent 06/13/2021. Stroke; Risk Factors:Hypertension and                  Diabetes.  Sonographer:     Sherrie Sport Referring Phys:  [id number] Soledad Gerlach NIU Diagnosing Phys: Yolonda Kida MD  Sonographer Comments: Suboptimal apical window and no subcostal window. IMPRESSIONS  1. Left ventricular ejection fraction, by estimation,  is 60 to 65%. The left ventricle has normal function. The left ventricle has no regional wall motion abnormalities. Left ventricular diastolic parameters are consistent with Grade I diastolic dysfunction (impaired relaxation).  2. Right ventricular systolic function is normal. The right ventricular size is normal.  3. The mitral valve is normal in structure. Trivial mitral valve regurgitation.  4. The aortic valve is normal in structure. Aortic valve regurgitation is not visualized. FINDINGS  Left Ventricle: Left ventricular ejection fraction, by estimation, is 60 to 65%. The left ventricle has normal function. The left ventricle has no regional wall motion abnormalities. The left ventricular internal cavity size was normal in size. There is  borderline left ventricular hypertrophy. Left ventricular diastolic parameters are consistent with Grade I diastolic dysfunction (impaired relaxation). Right Ventricle: The right ventricular size is normal. No increase in right ventricular wall  thickness. Right ventricular systolic function is normal. Left Atrium: Left atrial size was normal in size. Right Atrium: Right atrial size was normal in size. Pericardium: There is no evidence of pericardial effusion. Mitral Valve: The mitral valve is normal in structure. Trivial mitral valve regurgitation. MV peak gradient, 4.7 mmHg. The mean mitral valve gradient is 2.0 mmHg. Tricuspid Valve: The tricuspid valve is normal in structure. Tricuspid valve regurgitation is trivial. Aortic Valve: The aortic valve is normal in structure. Aortic valve regurgitation is not visualized. Aortic valve mean gradient measures 3.0 mmHg. Aortic valve peak gradient measures 5.0 mmHg. Aortic valve area, by VTI measures 2.60 cm. Pulmonic Valve: The pulmonic valve was normal in structure. Pulmonic valve regurgitation is not visualized. Aorta: The ascending aorta was not well visualized. IAS/Shunts: No atrial level shunt detected by color flow Doppler.  LEFT VENTRICLE PLAX 2D LVIDd:         3.30 cm   Diastology LVIDs:         2.10 cm   LV e' medial:    4.57 cm/s LV PW:         1.20 cm   LV E/e' medial:  11.1 LV IVS:        0.90 cm   LV e' lateral:   6.31 cm/s LVOT diam:     2.00 cm   LV E/e' lateral: 8.0 LV SV:         48 LV SV Index:   26 LVOT Area:     3.14 cm  RIGHT VENTRICLE RV Basal diam:  3.10 cm RV S prime:     13.60 cm/s TAPSE (M-mode): 2.0 cm LEFT ATRIUM             Index        RIGHT ATRIUM           Index LA diam:        2.70 cm 1.47 cm/m   RA Area:     10.70 cm LA Vol (A2C):   31.3 ml 17.09 ml/m  RA Volume:   23.80 ml  13.00 ml/m LA Vol (A4C):   21.7 ml 11.85 ml/m LA Biplane Vol: 26.3 ml 14.36 ml/m  AORTIC VALVE                    PULMONIC VALVE AV Area (Vmax):    2.56 cm     RVOT Peak grad: 8 mmHg AV Area (Vmean):   2.56 cm AV Area (VTI):     2.60 cm AV Vmax:           112.00 cm/s AV Vmean:  84.200 cm/s AV VTI:            0.186 m AV Peak Grad:      5.0 mmHg AV Mean Grad:      3.0 mmHg LVOT Vmax:          91.40 cm/s LVOT Vmean:        68.700 cm/s LVOT VTI:          0.154 m LVOT/AV VTI ratio: 0.83  AORTA Ao Root diam: 2.40 cm MITRAL VALVE                TRICUSPID VALVE MV Area (PHT): 3.91 cm     TR Peak grad:   11.3 mmHg MV Area VTI:   3.04 cm     TR Vmax:        168.00 cm/s MV Peak grad:  4.7 mmHg MV Mean grad:  2.0 mmHg     SHUNTS MV Vmax:       1.08 m/s     Systemic VTI:  0.15 m MV Vmean:      59.3 cm/s    Systemic Diam: 2.00 cm MV Decel Time: 194 msec     Pulmonic VTI:  0.236 m MV E velocity: 50.60 cm/s MV A velocity: 104.00 cm/s MV E/A ratio:  0.49 Dwayne D Callwood MD Electronically signed by Yolonda Kida MD Signature Date/Time: 01/25/2022/8:28:00 PM    Final     Assessment and Plan  [name] Gilbert is a 61 y.o. 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  Pt states that she had history of 3 strokes in the past.  She has chronic left-sided weakness.  She states that she has been feeling weaker in the past 2 days.  She feels like her left-sided weakness has worsened. She fell in the early morning. No LOC per her husband.   CT-head: 1. Relatively large acute to subacute infarct of the posterior and mesial Left temporal lobe, new since February. 2. No hemorrhagic transformation or significant mass effect. 3. Otherwise stable advanced underlying chronic ischemic disease. 4. No acute traumatic injury identified  MRI:  1. Confluent acute infarct in the medial left temporal lobe with additional punctate infarct in the left thalamus. Associated edema without significant mass effect. 2. Remote infarcts and moderate chronic microvascular disease.  MRA Head:  1. Left P1 PCA occlusion, previously severely stenotic. 2. Similar severe distal M1/proximal M2 MCA stenosis bilaterally. Similar mild to moderate right A1 ACA  stenosis.  MRA Neck:  No evidence of significant (greater than 50%) stenosis  Acute CVA recurrent, left temporal lobe -- Patient states that she had 3 strokes in the past.  Patient has chronic left-sided weakness, which has worsened.   --CT head showed relatively large acute to subacute infarct of the posterior and mesial Left temporal lobe, which is new since February. -- Seen by physical therapy recommends rehab -- seen by neurology recommends Aspirin - dose 81mg  and plavix 75mg  daily  for 90 days. Patient got loading dose of Plavix --CT head today due to right UE increasing weakness --5/19--addendum repeat CT head showed Evolving recent infarcts of the left temporal and occipital lobes and left thalamus with mild mass effect and no hemorrhage.   Right distal fibular oblique fracture status post fall -- PRN pain meds -- orthopedic consultation with Dr. Poggi--s/p splint. 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  >50% time spent on counselling and coordination of care  Note: This dictation was prepared with Dragon dictation along with smaller phrase technology. Any transcriptional errors that result from this process are unintentional.  [name] M.D    [facility]   CC: Primary care physician;  Christella Scheuermann, MD

## 2022-01-28 DIAGNOSIS — I639 Cerebral infarction, unspecified: Secondary | ICD-10-CM | POA: Diagnosis not present

## 2022-01-28 LAB — GLUCOSE, CAPILLARY
Glucose-Capillary: 182 mg/dL — ABNORMAL HIGH (ref 70–99)
Glucose-Capillary: 195 mg/dL — ABNORMAL HIGH (ref 70–99)
Glucose-Capillary: 264 mg/dL — ABNORMAL HIGH (ref 70–99)
Glucose-Capillary: 311 mg/dL — ABNORMAL HIGH (ref 70–99)

## 2022-01-28 NOTE — Progress Notes (Addendum)
Occupational Therapy Treatment Patient Details Name: [name] Gilbert MRN: [id number] DOB: January 09, 1961 Today's Date: 01/28/2022   History of present illness Patient is a 61 year old female with  a history of diabetes, hypertension as well as previous stroke who presents with difficulty speaking and increasing weakness.  Brain MRI reports confluent acute infarct in the medial left temporal lobe with  additional punctate infarct in the left thalamus, Associated edema  without significant mass effect, and remote infarcts. Of note, pt is now NWBing on RLE due to Right distal fibular oblique fracture status post fall and rapid response called 5/19 due to decreased RUE function, repeat CT head showed Evolving recent infarcts of the left temporal and occipital lobes and left thalamus with mild mass effect and no hemorrhage.   OT comments  Chart reviewed, Re-eval completed as pt has had a change in status, rapid response called 5/19 with CT scan showing evolving infarcts. Goals have been updated. Pt is alert to self, place, not oriented to date or situation. Pt is not performing at baseline for cognition with increased time, multiple cues required for one step direction following. Pt appears lethargic throughout. Pt requires MOD A for supine>sit, MAX A +2 for squat pivot transfer to safely adhere to [place] precautions. RUE appears flaccid with trace movement noted in shoulder, please see additional details below. Pt also appears to present with deficits in vision however unable to formally assess due to cognition on this date. Pt also presents with deficits in sensation and praxis throughout RUE. Pt was independent in ADL/IADL PTA and would benefit from skilled OT services to address functional deficits and to improve safe and optimal ADL completion. Pt is left in bedside chair, NAD, all needs met. OT will follow acutely.    Recommendations for follow up therapy are one component of a multi-disciplinary discharge  planning process, led by the attending physician.  Recommendations may be updated based on patient status, additional functional criteria and insurance authorization.    Follow Up Recommendations  Acute inpatient rehab (3hours/day)    Assistance Recommended at Discharge Frequent or constant Supervision/Assistance  Patient can return home with the following  A lot of help with walking and/or transfers;A lot of help with bathing/dressing/bathroom;Help with stairs or ramp for entrance   Equipment Recommendations  Other (comment) (per next venue of care)    Recommendations for Other Services      Precautions / Restrictions Precautions Precautions: Fall Restrictions Weight Bearing Restrictions: Yes RLE Weight Bearing: Non weight bearing       Mobility Bed Mobility Overal bed mobility: Needs Assistance Bed Mobility: Supine to Sit     Supine to sit: Mod assist, HOB elevated     General bed mobility comments: step by step vcs throughout    Transfers Overall transfer level: Needs assistance Equipment used: Rolling walker (2 wheels) Transfers: Sit to/from Stand Sit to Stand: Max assist, +2 safety/equipment, +2 physical assistance (poor adherence to NWBing precautions)                 Balance Overall balance assessment: Needs assistance Sitting-balance support: No upper extremity supported, Feet supported Sitting balance-Leahy Scale: Poor   Postural control: Posterior lean Standing balance support: Bilateral upper extremity supported, During functional activity, Reliant on assistive device for balance Standing balance-Leahy Scale: Zero                             ADL either performed or assessed  with clinical judgement   ADL Overall ADL's : Needs assistance/impaired     Grooming: Wash/dry face;Maximal assistance;Cueing for sequencing               Lower Body Dressing: Maximal assistance;Cueing for sequencing   Toilet Transfer: Maximal  assistance;Squat-pivot;+2 for physical assistance Toilet Transfer Details (indicate cue type and reason): simulated to bedside chair step by step vcs, pt attempted to assist with LUE Toileting- Clothing Manipulation and Hygiene: Total assistance              Extremity/Trunk Assessment Upper Extremity Assessment Upper Extremity Assessment: RUE deficits/detail;LUE deficits/detail RUE Deficits / Details: AROM: shoulder flexion: unable; trace movement noted; elbow flexion: 1/2 full AROM, 1+ MAS in elbow flexion/extension, wrist flexion/extension: 3/4 full AROM; weak grip strength RUE Sensation: decreased light touch;decreased proprioception RUE Coordination: decreased fine motor;decreased gross motor LUE Deficits / Details: AROM shoulder flexion: pt able to bring to 90 degrees without tactile cueing, to full AROM in shoudler flexion with tactile cues, elbow/wrist appear WFL; midly impaired grip strength LUE Coordination: decreased fine motor   Lower Extremity Assessment Lower Extremity Assessment: Defer to PT evaluation;RLE deficits/detail RLE Deficits / Details: pt is NWBing to RLE        Vision   Vision Assessment?: Yes;Vision impaired- to be further tested in functional context Ocular Range of Motion: Impaired-to be further tested in functional context Tracking/Visual Pursuits: Impaired - to be further tested in functional context Saccades: Impaired - to be further tested in functional context Additional Comments: pt unable to participate in full visial assessment, reports vision impairments at baseline, R eye strabismus noted (exotropia)   Perception Perception Perception: Impaired Inattention/Neglect: Impaired-to be further tested in functional context   Praxis Praxis Praxis: Impaired Praxis Impairment Details: Initiation;Motor planning    Cognition Arousal/Alertness: Awake/alert, Lethargic Behavior During Therapy: Flat affect Overall Cognitive Status: Impaired/Different  from baseline Area of Impairment: Attention, Memory, Following commands, Safety/judgement, Awareness, Problem solving                   Current Attention Level: Focused Memory: Decreased recall of precautions, Decreased short-term memory Following Commands: Follows one step commands inconsistently Safety/Judgement: Decreased awareness of safety, Decreased awareness of deficits Awareness: Intellectual Problem Solving: Slow processing, Decreased initiation, Difficulty sequencing, Requires verbal cues, Requires tactile cues          Exercises Other Exercises Other Exercises: edu re: importance of RUE positioning, NWBing to RLE precautions, importance of OOB mobility    Shoulder Instructions       General Comments      Pertinent Vitals/ Pain       Pain Assessment Pain Assessment: No/denies pain Faces Pain Scale: Hurts little more Pain Location: l knee Pain Descriptors / Indicators: Grimacing Pain Intervention(s): Monitored during session, Repositioned  Home Living                                          Prior Functioning/Environment              Frequency  Min 4X/week        Progress Toward Goals  OT Goals(current goals can now be found in the care plan section)     Acute Rehab OT Goals Patient Stated Goal: feel better OT Goal Formulation: With patient Time For Goal Achievement: 02/11/22 Potential to Achieve Goals: Good ADL Goals Pt Will Perform Grooming: with  min assist;standing;sitting Pt Will Perform Lower Body Dressing: with min assist;sit to/from stand Pt Will Transfer to Toilet: with min assist;stand pivot transfer;bedside commode  Plan Discharge plan remains appropriate    Co-evaluation    PT/OT/SLP Co-Evaluation/Treatment: Yes Reason for Co-Treatment: Necessary to address cognition/behavior during functional activity;For patient/therapist safety;To address functional/ADL transfers   OT goals addressed during session:  ADL's and self-care      AM-PAC OT "6 Clicks" Daily Activity     Outcome Measure   Help from another person eating meals?: A Lot Help from another person taking care of personal grooming?: A Lot Help from another person toileting, which includes using toliet, bedpan, or urinal?: A Lot Help from another person bathing (including washing, rinsing, drying)?: A Lot Help from another person to put on and taking off regular upper body clothing?: A Lot Help from another person to put on and taking off regular lower body clothing?: A Lot 6 Click Score: 12    End of Session Equipment Utilized During Treatment: Gait belt;Rolling walker (2 wheels)  OT Visit Diagnosis: Muscle weakness (generalized) (M62.81);Other symptoms and signs involving cognitive function   Activity Tolerance Patient tolerated treatment well   Patient Left in chair;with call bell/phone within reach;with chair alarm set   Nurse Communication Mobility status        Time: 2992-4268 OT Time Calculation (min): 30 min  Charges: OT General Charges $OT Visit: 1 Visit OT Evaluation $OT Re-eval: 1 Re-eval OT Treatments $Therapeutic Activity: 8-22 mins Shanon Payor, OTD OTR/L  01/28/22, 12:28 PM

## 2022-01-28 NOTE — Progress Notes (Signed)
Physical Therapy Treatment Patient Details Name: [name] Gilbert MRN: [id number] DOB: Jun 05, 1961 Today's Date: 01/28/2022   History of Present Illness Patient is a 61 year old female with  a history of diabetes, hypertension as well as previous stroke who presents with difficulty speaking and increasing weakness.  Brain MRI reports confluent acute infarct in the medial left temporal lobe with  additional punctate infarct in the left thalamus, Associated edema  without significant mass effect, and remote infarcts. Of note, pt is now NWBing on RLE due to Right distal fibular oblique fracture status post fall and rapid response called 5/19 due to decreased RUE function, repeat CT head showed Evolving recent infarcts of the left temporal and occipital lobes and left thalamus with mild mass effect and no hemorrhage.    PT Comments    Pt seen as PT/OT co re-evaluation performed due to pt's change in medical status from initial evaluation. The patient was able to lift RLE and LLE against gravity, able to bend R knee AAROM, reported decreased light touch sensation of RLE (as well as RUE). Supine to sit with modA, extra time and use of bed rails. CGA-minA in sitting due to intermittent posterior lean, pt able to correct this with cueing. Sit <> stand attempted with maxAx2, not able to maintain NWB precautions on RLE. Lateral scoot to R with maxAx2, pt able to lean forward and push with LUE with cueing.  Overall the patient demonstrated deficits (see "PT Problem List") that impede the patient's functional abilities, safety, and mobility and would benefit from skilled PT intervention. Recommendation at this time is inpatient rehab to maximize pts function, safety, and independence.      Recommendations for follow up therapy are one component of a multi-disciplinary discharge planning process, led by the attending physician.  Recommendations may be updated based on patient status, additional functional criteria  and insurance authorization.  Follow Up Recommendations  Acute inpatient rehab (3hours/day)     Assistance Recommended at Discharge Intermittent Supervision/Assistance  Patient can return home with the following Assist for transportation;Help with stairs or ramp for entrance;Two people to help with walking and/or transfers;Two people to help with bathing/dressing/bathroom   Equipment Recommendations  Other (comment) (TBD at next venue of care)    Recommendations for Other Services       Precautions / Restrictions Precautions Precautions: Fall Restrictions Weight Bearing Restrictions: Yes RLE Weight Bearing: Non weight bearing     Mobility  Bed Mobility Overal bed mobility: Needs Assistance Bed Mobility: Supine to Sit     Supine to sit: Mod assist, HOB elevated          Transfers Overall transfer level: Needs assistance Equipment used: Rolling walker (2 wheels) Transfers: Sit to/from Stand, Bed to chair/wheelchair/BSC Sit to Stand: Max assist, +2 safety/equipment, +2 physical assistance (poor adherence to weight bearing precautions)          Lateral/Scoot Transfers: Max assist, +2 physical assistance      Ambulation/Gait                   Stairs             Wheelchair Mobility    Modified Rankin (Stroke Patients Only)       Balance Overall balance assessment: Needs assistance Sitting-balance support: No upper extremity supported, Feet supported Sitting balance-Leahy Scale: Poor   Postural control: Posterior lean Standing balance support: Bilateral upper extremity supported, During functional activity, Reliant on assistive device for balance Standing balance-Leahy Scale: Zero  Cognition Arousal/Alertness: Awake/alert, Lethargic Behavior During Therapy: Flat affect Overall Cognitive Status: Impaired/Different from baseline Area of Impairment: Attention, Memory, Following commands,  Safety/judgement, Awareness, Problem solving                   Current Attention Level: Focused Memory: Decreased recall of precautions, Decreased short-term memory Following Commands: Follows one step commands inconsistently Safety/Judgement: Decreased awareness of safety, Decreased awareness of deficits Awareness: Intellectual Problem Solving: Slow processing, Decreased initiation, Difficulty sequencing, Requires verbal cues, Requires tactile cues          Exercises      General Comments        Pertinent Vitals/Pain Pain Assessment Pain Assessment: Faces Faces Pain Scale: Hurts little more Pain Location: l knee Pain Descriptors / Indicators: Grimacing Pain Intervention(s): Monitored during session, Repositioned    Home Living                          Prior Function            PT Goals (current goals can now be found in the care plan section) Progress towards PT goals: Progressing toward goals    Frequency    7X/week      PT Plan Current plan remains appropriate    Co-evaluation   Reason for Co-Treatment: Necessary to address cognition/behavior during functional activity;For patient/therapist safety;To address functional/ADL transfers   OT goals addressed during session: ADL's and self-care      AM-PAC PT "6 Clicks" Mobility   Outcome Measure  Help needed turning from your back to your side while in a flat bed without using bedrails?: A Lot Help needed moving from lying on your back to sitting on the side of a flat bed without using bedrails?: A Lot Help needed moving to and from a bed to a chair (including a wheelchair)?: A Lot Help needed standing up from a chair using your arms (e.g., wheelchair or bedside chair)?: A Lot Help needed to walk in hospital room?: Total Help needed climbing 3-5 steps with a railing? : Total 6 Click Score: 10    End of Session Equipment Utilized During Treatment: Gait belt Activity Tolerance: Patient  tolerated treatment well Patient left: with call bell/phone within reach;with chair alarm set;in chair Nurse Communication: Mobility status PT Visit Diagnosis: Other abnormalities of gait and mobility (R26.89);Muscle weakness (generalized) (M62.81);Pain Pain - Right/Left: Right Pain - part of body: Ankle and joints of foot     Time: [id number] PT Time Calculation (min) (ACUTE ONLY): 31 min  Charges:  $Therapeutic Activity: 8-22 mins                     Lieutenant Diego PT, DPT 12:27 PM,01/28/22

## 2022-01-28 NOTE — TOC Progression Note (Signed)
Transition of Care [facility]) - Progression Note    Patient Details  Name: [name] Gilbert MRN: [id number] Date of Birth: 10/21/1960  [facility] of [facility]) CM/SW Contact  Bing Quarry, RN Phone Number: 01/28/2022, 3:12 PM  Clinical Narrative:   5/20:Acute inpatient rehab recommended by PT. CIR intial consult  completed and acceptance pending. Gabriel Cirri RN CM          Expected Discharge Plan and Services                                                 Social Determinants of Health (SDOH) Interventions    Readmission Risk Interventions     View : No data to display.

## 2022-01-28 NOTE — Progress Notes (Signed)
Inpatient Rehab Admissions Coordinator Note:   Per therapy patient was screened for CIR candidacy by Arville Postlewaite Danford Bad, CCC-SLP. At this time, pt appears to be a potential candidate for CIR. I will place an order for rehab consult for full assessment, per our protocol.  Please contact me any with questions.Gayland Curry, [place], [place] Admissions Coordinator [phone] 01/28/22 2:11 PM

## 2022-01-28 NOTE — Progress Notes (Signed)
[name] Gilbert at [place] NAME: [name] Gilbert    MR#:  [id number]  DATE OF BIRTH:  1961-05-26  SUBJECTIVE:   No family at bedside. Patient more awake today. Able to move right upper extremity well. Feels warm to touch.  VITALS:  Blood pressure 121/63, pulse 66, temperature 98.4 F (36.9 C), resp. rate 16, height 5\' 2"  (1.575 m), weight 74.2 kg, SpO2 97 %.  PHYSICAL EXAMINATION:   GENERAL:  61 y.o.-year-old patient lying in the bed with no acute distress. Deconditioned LUNGS: Normal breath sounds bilaterally, no wheezing, rales, rhonchi.  CARDIOVASCULAR: S1, S2 normal. No murmurs, rubs, or gallops.  ABDOMEN: Soft, nontender, nondistended. Bowel sounds present.  EXTREMITIES: right leg splint+ NEUROLOGIC: nonfocal  patient is alert and awake right sided hemiparesis with more prominent right upper extremity weakness--improving today   LABORATORY PANEL:  CBC Recent Labs  Lab 01/25/22 0611  WBC 7.5  HGB 12.2  HCT 36.9  PLT 244     Chemistries  Recent Labs  Lab 01/25/22 0611  NA 132*  K 4.2  CL 96*  CO2 27  GLUCOSE 261*  BUN 27*  CREATININE 1.71*  CALCIUM 8.5*  AST 27  ALT 14  ALKPHOS 99  BILITOT 0.9    Cardiac Enzymes No results for input(s): TROPONINI in the last 168 hours. RADIOLOGY:  DG Ankle Complete Right  Result Date: 01/26/2022 CLINICAL DATA:  Right ankle pain after fall 2 days ago EXAM: RIGHT ANKLE - COMPLETE 3+ VIEW COMPARISON:  None Available. FINDINGS: Acute obliquely oriented fracture of the distal fibular metaphysis with minimal lateral displacement. No fracture identified of the medial or posterior malleoli. Ankle mortise is congruent. No dislocation. Soft tissue swelling about the ankle. IMPRESSION: Acute obliquely oriented fracture of the distal fibular metaphysis with minimal lateral displacement. Electronically Signed   By: Davina Poke D.O.   On: 01/26/2022 14:02   CT HEAD WO CONTRAST  (5MM)  Result Date: 01/27/2022 CLINICAL DATA:  Altered mental status, nontraumatic (Ped 0-17y); sudden onset right-sided weakness EXAM: CT HEAD WITHOUT CONTRAST TECHNIQUE: Contiguous axial images were obtained from the base of the skull through the vertex without intravenous contrast. RADIATION DOSE REDUCTION: This exam was performed according to the departmental dose-optimization program which includes automated exposure control, adjustment of the mA and/or kV according to patient size and/or use of iterative reconstruction technique. COMPARISON:  01/25/2022 FINDINGS: Brain: Evolving recent infarction of the medial left temporal lobe extending into the occipital lobe. Patchy density in the left thalamus likely corresponds to acute infarction seen on the MRI. Similar regional mass effect. No hemorrhage. Unchanged chronic infarcts and chronic microvascular ischemic changes. Ventricles and sulci are stable in size and configuration. No extra-axial collection. Vascular: There is atherosclerotic calcification at the skull base. Skull: Calvarium is unremarkable. Sinuses/Orbits: No acute finding. Other: None. IMPRESSION: Evolving recent infarcts of the left temporal and occipital lobes and left thalamus with mild mass effect and no hemorrhage. Electronically Signed   By: Macy Mis M.D.   On: 01/27/2022 14:37    Assessment and Plan  [name] Gilbert is a 61 y.o. 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  Pt states that she had history of 3 strokes in the past.  She has chronic left-sided weakness.  She states that she has been feeling weaker in the past 2 days.  She feels like her left-sided weakness has worsened. She fell in the early morning. No LOC per her husband.   CT-head: 1. Relatively large acute to subacute infarct of the  posterior and mesial Left temporal lobe, new since February. 2. No hemorrhagic transformation or significant mass effect. 3. Otherwise stable advanced underlying chronic ischemic disease. 4. No acute traumatic injury identified  MRI:  1. Confluent acute infarct in the medial left temporal lobe with additional punctate infarct in the left thalamus. Associated edema without significant mass effect. 2. Remote infarcts and moderate chronic microvascular disease.  MRA Head:  1. Left P1 PCA occlusion, previously severely stenotic. 2. Similar severe distal M1/proximal M2 MCA stenosis bilaterally. Similar mild to moderate right A1 ACA stenosis.  MRA Neck:  No evidence of significant (greater than 50%) stenosis  Acute CVA recurrent, left temporal lobe -- Patient states that she had 3 strokes in the past.  Patient has chronic left-sided weakness, which has worsened.   --CT head showed relatively large acute to subacute infarct of the posterior and mesial Left temporal lobe, which is new since February. -- Seen by physical therapy recommends rehab -- seen by neurology recommends Aspirin - dose 81mg  and plavix 75mg  daily  for 90 days. Patient got loading dose of Plavix --CT head today due to right UE increasing weakness --5/19--addendum repeat CT head showed Evolving recent infarcts of the left temporal and occipital lobes and left thalamus with mild mass effect and no hemorrhage.   Right distal fibular oblique fracture status post fall -- PRN pain meds -- orthopedic consultation with Dr. Poggi--s/p splint. Right LE NWB  Hypertension --  home meds-- amlodipine, hydralazine, Coreg isinopril and hydrochlorothiazide  --holding BP meds for now to allow permissive HTN. 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  >50% time spent on counselling and coordination of care  Note: This dictation was prepared with Dragon dictation along with smaller phrase technology. Any transcriptional errors that result from this process are unintentional.  [name] M.D    [name]   CC: Primary care physician; Christella Scheuermann, MD

## 2022-01-28 NOTE — Progress Notes (Signed)
Subjective: Right arm is slightly better today.   Exam: Vitals:   01/28/22 0422 01/28/22 0809  BP: (!) 110/56 121/63  Pulse: 64 66  Resp: 18 16  Temp: 98.5 F (36.9 C) 98.4 F (36.9 C)  SpO2: 99% 97%   Gen: In bed, NAD Resp: non-labored breathing, no acute distress Abd: soft, nt  Neuro: MS: awake, alert, interactive and appropriate CN:R hemianopia., does not show teeth well when asked.  Motor: she is able to lift her right arm against gravity, but still with significant right hand weakness. 4/5 throughout left side.   Pertinent Labs: LDL 214  Impression: 61 year old female with some fluctuation in the setting of recent stroke. I suspect that she will need prolonged permissive hypertension. 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 I would favor prolonged permissive hypertension for now.   Recommendations: 1) Would continue IV fluids for now.  2) hold anti-hypertensives, I would restart one at a time, giving a few days between re-introductions. Could use PRNs for BP > 180/100 3) Pt, OT,ST  Ritta Slot, MD [facility] [phone]  If 7pm- 7am, please page neurology on call as listed in AMION.

## 2022-01-28 NOTE — Progress Notes (Addendum)
Inpatient Rehab Admissions Coordinator:  Consult received. Per NSG advice, contacted pt's spouse. Left a message; awaiting return call. Will continue to follow.  ADDENDUM 1521: Pt's spouse returned call. Explained CIR goals and expectations. He acknowledged understanding. He would like to discuss CIR with pt to determine which therapy venue (CIR versus SNF) to pursue.    Wolfgang Phoenix, MS, CCC-SLP Admissions Coordinator [phone]

## 2022-01-29 DIAGNOSIS — I639 Cerebral infarction, unspecified: Secondary | ICD-10-CM | POA: Diagnosis not present

## 2022-01-29 LAB — GLUCOSE, CAPILLARY
Glucose-Capillary: 265 mg/dL — ABNORMAL HIGH (ref 70–99)
Glucose-Capillary: 311 mg/dL — ABNORMAL HIGH (ref 70–99)
Glucose-Capillary: 291 mg/dL — ABNORMAL HIGH (ref 70–99)
Glucose-Capillary: 176 mg/dL — ABNORMAL HIGH (ref 70–99)

## 2022-01-29 MED ORDER — INSULIN GLARGINE-YFGN 100 UNIT/ML ~~LOC~~ SOLN
15.0000 [IU] | Freq: Every day | SUBCUTANEOUS | Status: DC
  Administered 2022-01-29 – 2022-01-30 (×2): 15 [IU] via SUBCUTANEOUS
  Filled 2022-01-29 (×2): qty 0.15

## 2022-01-29 NOTE — Progress Notes (Signed)
Occupational Therapy Treatment Patient Details Name: [name] Gilbert MRN: [id number] DOB: 11/07/1960 Today's Date: 01/29/2022   History of present illness Patient is a 61 year old female with  a history of diabetes, hypertension as well as previous stroke who presents with difficulty speaking and increasing weakness.  Brain MRI reports confluent acute infarct in the medial left temporal lobe with  additional punctate infarct in the left thalamus, Associated edema  without significant mass effect, and remote infarcts. Of note, pt is now NWBing on RLE due to Right distal fibular oblique fracture status post fall and rapid response called 5/19 due to decreased RUE function, repeat CT head showed Evolving recent infarcts of the left temporal and occipital lobes and left thalamus with mild mass effect and no hemorrhage.   OT comments  [name] Gilbert was seen for OT treatment on this date, overlapping with PT and SLP. Upon arrival to room pt reclined in bed, family at bed side. Pt requires MAX A x2 lateral scoot t/f bed>chair. MAX A hand over hand self-drinking in sitting using RUE - cues for head turns to locate straw. MOD A self-feeding in sitting - assist for RUE to stabilize apple sauce, MAX multimodal cues to sequence using LUE as pt repeatedly brings spoon to mouth without anything on it - does not appear aware.   RUE neuromuslucar re-education with tapping facilitation of wrist extension and elbow flexion. Poor sensation in RUE - when asked to identify touch to RUE with vision occluded pt unable to answer without categories given. When given choice between wrist vs elbow pt states wrist. Pt appears perseverative as next given choice between thumb and pinky pt states wrist. Pt making good progress toward goals. Will continue to follow POC. Discharge recommendation remains appropriate.     Recommendations for follow up therapy are one component of a multi-disciplinary discharge planning process, led by the  attending physician.  Recommendations may be updated based on patient status, additional functional criteria and insurance authorization.    Follow Up Recommendations  Acute inpatient rehab (3hours/day)    Assistance Recommended at Discharge Frequent or constant Supervision/Assistance  Patient can return home with the following  Help with stairs or ramp for entrance;Two people to help with walking and/or transfers;Two people to help with bathing/dressing/bathroom   Equipment Recommendations  Other (comment) (defer to next venue of care)    Recommendations for Other Services      Precautions / Restrictions Precautions Precautions: Fall Restrictions Weight Bearing Restrictions: Yes RLE Weight Bearing: Non weight bearing       Mobility Bed Mobility Overal bed mobility: Needs Assistance Bed Mobility: Supine to Sit     Supine to sit: Mod assist, HOB elevated          Transfers Overall transfer level: Needs assistance   Transfers: Bed to chair/wheelchair/BSC            Lateral/Scoot Transfers: Max assist, +2 physical assistance       Balance Overall balance assessment: Needs assistance Sitting-balance support: No upper extremity supported, Feet supported Sitting balance-Leahy Scale: Fair Sitting balance - Comments: maintains midline                                   ADL either performed or assessed with clinical judgement   ADL Overall ADL's : Needs assistance/impaired  General ADL Comments: MAX A x2 for simulated BSC t/f. MAX A hand over hand self-drinking in sitting using RUE - cues for head turns to locate straw. MOD A self-feeding in sitting - assist for RUE to stabilize apple sauce, MAX multimodal cues to sequence using LUE as pt repeatedly brings spoon to mouth without anything on it - does not appear aware.     Vision   Vision Assessment?: Vision impaired- to be further tested in  functional context Additional Comments: difficulty tracking and locating objects          Cognition Arousal/Alertness: Awake/alert, Lethargic Behavior During Therapy: Flat affect Overall Cognitive Status: Impaired/Different from baseline Area of Impairment: Attention, Memory, Following commands, Safety/judgement, Awareness, Problem solving                   Current Attention Level: Focused Memory: Decreased recall of precautions, Decreased short-term memory Following Commands: Follows one step commands inconsistently Safety/Judgement: Decreased awareness of safety, Decreased awareness of deficits Awareness: Intellectual Problem Solving: Slow processing, Decreased initiation, Difficulty sequencing, Requires verbal cues, Requires tactile cues          Exercises Other Exercises Other Exercises: RUE neuromuslucar re-education with tapping facilitation of wrist extension and elbow flexion. Poor sesnation            Pertinent Vitals/ Pain       Pain Assessment Pain Assessment: Faces Faces Pain Scale: Hurts a little bit Pain Location: with R leg movement Pain Descriptors / Indicators: Grimacing Pain Intervention(s): Limited activity within patient's tolerance, Repositioned   Frequency  Min 4X/week        Progress Toward Goals  OT Goals(current goals can now be found in the care plan section)  Progress towards OT goals: Progressing toward goals  Acute Rehab OT Goals Patient Stated Goal: to feed self OT Goal Formulation: With patient Time For Goal Achievement: 02/11/22 Potential to Achieve Goals: Good ADL Goals Pt Will Perform Grooming: with min assist;standing;sitting Pt Will Perform Lower Body Dressing: with min assist;sit to/from stand Pt Will Transfer to Toilet: with min assist;stand pivot transfer;bedside commode  Plan Discharge plan remains appropriate;Frequency remains appropriate    Co-evaluation    PT/OT/SLP Co-Evaluation/Treatment: Yes Reason  for Co-Treatment: For patient/therapist safety;To address functional/ADL transfers;Complexity of the patient's impairments (multi-system involvement) PT goals addressed during session: Mobility/safety with mobility OT goals addressed during session: ADL's and self-care SLP goals addressed during session: Cognition;Swallowing    AM-PAC OT "6 Clicks" Daily Activity     Outcome Measure   Help from another person eating meals?: A Lot Help from another person taking care of personal grooming?: A Lot Help from another person toileting, which includes using toliet, bedpan, or urinal?: A Lot Help from another person bathing (including washing, rinsing, drying)?: A Lot Help from another person to put on and taking off regular upper body clothing?: A Lot Help from another person to put on and taking off regular lower body clothing?: A Lot 6 Click Score: 12    End of Session    OT Visit Diagnosis: Muscle weakness (generalized) (M62.81);Other symptoms and signs involving cognitive function   Activity Tolerance Patient tolerated treatment well   Patient Left in chair;with call bell/phone within reach;with chair alarm set   Nurse Communication          Time: 5621-3086 OT Time Calculation (min): 33 min  Charges: OT General Charges $OT Visit: 1 Visit OT Treatments $Self Care/Home Management : 8-22 mins  Kathie Dike, M.S. OTR/L  01/29/22, 1:40 PM  ascom [phone]

## 2022-01-29 NOTE — Evaluation (Signed)
Clinical/Bedside Swallow Evaluation Patient Details  Name: [name] Gilbert MRN: [id number] Date of Birth: [birth year]  Today's Date: 01/29/2022 Time: SLP Start Time (ACUTE ONLY): 1125 SLP Stop Time (ACUTE ONLY): 1150 SLP Time Calculation (min) (ACUTE ONLY): 25 min  Past Medical History:  Past Medical History:  Diagnosis Date   Diabetes mellitus without complication (HCC)    Hypertension    Stroke (HCC)    visual impaired    Past Surgical History:  Past Surgical History:  Procedure Laterality Date   ABDOMINAL HYSTERECTOMY     TONSILLECTOMY     HPI:  Per admitting H & P "[name] Gilbert is a 61 y.o. 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     Pt states that she had history of 3 strokes in the past.  She has chronic left-sided weakness.  She states that she has been feeling weaker in the past 2 days.  She feels like her left-sided weakness has worsened. She fell in the early morning. No LOC per her husband.  Denies headache or neck pain.  She has mild left hip pain.  Patient does not have facial droop or slurred speech.  Denies chest pain, cough, shortness breath, no fever or chills.  Denies nausea vomiting, diarrhea or abdominal pain. Denies symptoms of UTI.  She states that her blood pressure has been poorly controlled recently.  Her blood pressure is at 204/98 in ED. Per chart review, patient was started on Keppra since last stroke.  She denies history of seizure."    Assessment / Plan / Recommendation  Clinical Impression  Pt seen for clinical swallowing evaluation. Pt alert, slow to respond. Verbal perseveration noted. OT present for co-tx for part of session. Daughter present for beginning of evaluation.  Oral motor exam notable for R orofacial weakness, perseverative lingual movements during lateralization,  and weak/hypophonic vocal quality.   Pt given trials of solid, pureed, and thin liquids (via straw). Pt with s/sx at least a mild oral dysphagia c/b prolonged mastication of solids and prolonged A-P transit with pureed. Additionally, pt with mild lingual residual with solid which reduced with prompted liquid wash. Suspect oral deficits due to lingual weakness/incoordination. [facility]. To palpation, pt with seemingly timely swallow initiation and seemingly adequate laryngeal elevation. No overt or subtle s/sx pharyngeal dysphagia or changes to vocal quality appreciated across trials.  Pt required physical assistance and verbal/tactile cues to feed self. See OT note for additional details.  Recommend diet downgrade to mech soft diet with thin liquids and safe swallowing strategies/aspiration precautions as outlined below including alternated bites/sips, checking for oral clearance throughout POs, and assistance with feeding with encouragement for self feeding as much as possible.  Pt is at increased risk for aspiration/aspiration PNA given cognitive-linguistic deficits, need for assistance with feeding, and multiple medical comorbidities.  SLP to f/u per POC for diet tolerance.   Pt and RN made aware of results, recommendations, and SLP POC. ?understanding by pt.   SLP Visit Diagnosis: Dysphagia, oral phase (R13.11)    Aspiration Risk  Mild aspiration risk    Diet Recommendation Dysphagia 3 (Mech soft);Thin liquid   Medication Administration:  (as tolerated) Supervision: Full supervision/cueing for compensatory strategies;Staff to assist with self feeding (encourage self-feeding as much as possible) Compensations: Minimize environmental distractions;Slow rate;Small sips/bites;Follow solids with liquid (check for oral clearance between bites/sips and  at end of meal) Postural Changes: Seated upright at 90 degrees;Remain upright for at least 30 minutes  after po intake    Other  Recommendations Oral Care Recommendations: Oral care BID;Staff/trained caregiver to provide oral care    Recommendations for follow up therapy are one component of a multi-disciplinary discharge planning process, led by the attending physician.  Recommendations may be updated based on patient status, additional functional criteria and insurance authorization.  Follow up Recommendations Acute inpatient rehab (3hours/day)      Assistance Recommended at Discharge Frequent or constant Supervision/Assistance  Functional Status Assessment Patient has had a recent decline in their functional status and demonstrates the ability to make significant improvements in function in a reasonable and predictable amount of time.  Frequency and Duration min 2x/week  2 weeks       Prognosis Prognosis for Safe Diet Advancement: Fair Barriers to Reach Goals: Cognitive deficits;Severity of deficits      Swallow Study   General Date of Onset: 01/25/22 HPI: Per admitting H & P "[name] Gilbert is a 61 y.o. 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     Pt states that she had history of 3 strokes in the past.  She has chronic left-sided weakness.  She states that she has been feeling weaker in the past 2 days.  She feels like her left-sided weakness has worsened. She fell in the early morning. No LOC per her husband.  Denies headache or neck pain.  She has mild left hip pain.  Patient does not have facial droop or slurred speech.  Denies chest pain, cough, shortness breath, no fever or chills.  Denies nausea vomiting, diarrhea or abdominal pain. Denies symptoms of UTI.  She states that her blood pressure has been poorly controlled recently.  Her blood pressure is at 204/98 in ED. Per chart review, patient was started  on Keppra since last stroke.  She denies history of seizure." Type of Study: Bedside Swallow Evaluation Previous Swallow Assessment: 10/04/19 clinical swallowing evalutation recommended Dysphagia 2 Diet with Thin Liquids Diet Prior to this Study: Regular;Thin liquids Temperature Spikes Noted: Yes Respiratory Status: Room air History of Recent Intubation: No Behavior/Cognition: Alert;Cooperative;Requires cueing;Lethargic/Drowsy (waxing/waning LOA) Oral Cavity Assessment: Within Functional Limits Oral Care Completed by SLP: Recent completion by staff Oral Cavity - Dentition: Adequate natural dentition Vision: Impaired for self-feeding (reduced tracking/scanning to R) Self-Feeding Abilities: Needs assist;Total assist Patient Positioning: Upright in chair Baseline Vocal Quality: Low vocal intensity Volitional Cough: Strong Volitional Swallow: Able to elicit    Oral/Motor/Sensory Function Overall Oral Motor/Sensory Function: Moderate impairment Facial ROM: Reduced right Facial Symmetry: Abnormal symmetry right Lingual ROM: Reduced right Lingual Strength: Reduced   Ice Chips Ice chips: Not tested   Thin Liquid Thin Liquid: Impaired Presentation: Straw Oral Phase Impairments:  (WFL) Oral Phase Functional Implications:  (WFL) Pharyngeal  Phase Impairments:  (WFL)    Nectar Thick Nectar Thick Liquid: Not tested   Honey Thick Honey Thick Liquid: Not tested   Puree Puree: Impaired Presentation: Spoon Oral Phase Impairments: Reduced lingual movement/coordination Oral Phase Functional Implications: Prolonged oral transit Pharyngeal Phase Impairments:  (WFL)   Solid     Solid: Impaired Presentation: Self Fed Oral Phase Impairments: Impaired mastication;Reduced lingual movement/coordination Oral Phase Functional Implications: Oral residue;Impaired mastication Pharyngeal Phase Impairments:  (WFL)     Clyde Canterbury, M.S., CCC-SLP Speech-Language Pathologist [place] -  [facility] [phone] (ASCOM)   Woodroe Chen 01/29/2022,12:46 PM

## 2022-01-29 NOTE — Progress Notes (Addendum)
[facility] NAME: [name] Gilbert    MR#:  [id number]  DATE OF BIRTH:  01-25-1961  SUBJECTIVE:   No family at bedside. Patient more awake today. Able to move right upper extremity well. Feels warm to touch. Pt reportedly does not eat hospital food. She is getting food from outside  VITALS:  Blood pressure 139/71, pulse 64, temperature 98.5 F (36.9 C), resp. rate 17, height 5\' 2"  (1.575 m), weight 74.2 kg, SpO2 98 %.  PHYSICAL EXAMINATION:   GENERAL:  61 y.o.-year-old patient lying in the bed with no acute distress. Deconditioned LUNGS: Normal breath sounds bilaterally, no wheezing, rales, rhonchi.  CARDIOVASCULAR: S1, S2 normal. No murmurs, rubs, or gallops.  ABDOMEN: Soft, nontender, nondistended. Bowel sounds present.  EXTREMITIES: right leg splint+ NEUROLOGIC: nonfocal  patient is alert and awake right sided hemiparesis with more prominent right upper extremity weakness--improving today   LABORATORY PANEL:  CBC Recent Labs  Lab 01/25/22 0611  WBC 7.5  HGB 12.2  HCT 36.9  PLT 244     Chemistries  Recent Labs  Lab 01/25/22 0611  NA 132*  K 4.2  CL 96*  CO2 27  GLUCOSE 261*  BUN 27*  CREATININE 1.71*  CALCIUM 8.5*  AST 27  ALT 14  ALKPHOS 99  BILITOT 0.9    Cardiac Enzymes No results for input(s): TROPONINI in the last 168 hours. RADIOLOGY:  CT HEAD WO CONTRAST (5MM)  Result Date: 01/27/2022 CLINICAL DATA:  Altered mental status, nontraumatic (Ped 0-17y); sudden onset right-sided weakness EXAM: CT HEAD WITHOUT CONTRAST TECHNIQUE: Contiguous axial images were obtained from the base of the skull through the vertex without intravenous contrast. RADIATION DOSE REDUCTION: This exam was performed according to the departmental dose-optimization program which includes automated exposure control, adjustment of the mA and/or kV according to patient size and/or use of iterative reconstruction technique.  COMPARISON:  01/25/2022 FINDINGS: Brain: Evolving recent infarction of the medial left temporal lobe extending into the occipital lobe. Patchy density in the left thalamus likely corresponds to acute infarction seen on the MRI. Similar regional mass effect. No hemorrhage. Unchanged chronic infarcts and chronic microvascular ischemic changes. Ventricles and sulci are stable in size and configuration. No extra-axial collection. Vascular: There is atherosclerotic calcification at the skull base. Skull: Calvarium is unremarkable. Sinuses/Orbits: No acute finding. Other: None. IMPRESSION: Evolving recent infarcts of the left temporal and occipital lobes and left thalamus with mild mass effect and no hemorrhage. Electronically Signed   By: Macy Mis M.D.   On: 01/27/2022 14:37    Assessment and Plan  [name] Gilbert is a 61 y.o. 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  Pt states that she had history of 3 strokes in the past.  She has chronic left-sided weakness.  She states that she has been feeling weaker in the past 2 days.  She feels like her left-sided weakness has worsened. She fell in the early morning. No LOC per her husband.   CT-head: 1. Relatively large acute to subacute infarct of the posterior and mesial Left temporal lobe, new since February. 2. No hemorrhagic transformation or significant mass effect. 3. Otherwise stable advanced underlying chronic ischemic disease. 4. No acute traumatic injury identified  MRI:  1. Confluent acute infarct in the medial left temporal lobe with additional punctate infarct in the left  thalamus. Associated edema without significant mass effect. 2. Remote infarcts and moderate chronic microvascular disease.  MRA Head:  1. Left P1 PCA occlusion, previously severely stenotic. 2.  Similar severe distal M1/proximal M2 MCA stenosis bilaterally. Similar mild to moderate right A1 ACA stenosis.  MRA Neck:  No evidence of significant (greater than 50%) stenosis  Acute CVA recurrent, left temporal lobe -- Patient states that she had 3 strokes in the past.  Patient has chronic left-sided weakness, which has worsened.   --CT head showed relatively large acute to subacute infarct of the posterior and mesial Left temporal lobe, which is new since February. -- Seen by physical therapy recommends rehab -- seen by neurology recommends Aspirin - dose 81mg  and plavix 75mg  daily  for 90 days. Patient got loading dose of Plavix --CT head today due to right UE increasing weakness --5/19--addendum repeat CT head showed Evolving recent infarcts of the left temporal and occipital lobes and left thalamus with mild mass effect and no hemorrhage.   Right distal fibular oblique fracture status post fall -- PRN pain meds -- orthopedic consultation with Dr. Poggi--s/p splint. Right LE NWB  Hypertension --  home meds-- amlodipine, hydralazine, Coreg isinopril and hydrochlorothiazide  --holding BP meds for now to allow permissive HTN. Received IVF  type II diabetes, uncontrolled with hyperlipidemia -- resume insulin and sliding scale -- continue statins -- patient is apparently on regular diet since she does not eat hospital food. She is very picky about her food according to her family. For her to get nutrition I have allowed regular food. 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 Patient is medically improving for discharge    TOTAL TIME TAKING CARE OF THIS PATIENT: 35 minutes.  >50% time spent on counselling and coordination of care  Note: This dictation was prepared with Dragon dictation along with smaller phrase technology. Any transcriptional errors that result from this process are unintentional.  [name] M.D    [facility]   CC: Primary care physician; Christella Scheuermann, MD

## 2022-01-29 NOTE — Progress Notes (Signed)
Inpatient Rehab Admissions Coordinator:  Attempted to get in touch with pt's spouse to find out decision regarding rehab venue after discharge. No one answered. Left a message; awaiting return call.   Wolfgang Phoenix, MS, CCC-SLP Admissions Coordinator [phone]

## 2022-01-29 NOTE — Progress Notes (Signed)
Physical Therapy Treatment Patient Details Name: [name] Gilbert MRN: [id number] DOB: [birth year] Today's Date: 01/29/2022   History of Present Illness Patient is a 61 year old female with  a history of diabetes, hypertension as well as previous stroke who presents with difficulty speaking and increasing weakness.  Brain MRI reports confluent acute infarct in the medial left temporal lobe with  additional punctate infarct in the left thalamus, Associated edema  without significant mass effect, and remote infarcts. Of note, pt is now NWBing on RLE due to Right distal fibular oblique fracture status post fall and rapid response called 5/19 due to decreased RUE function, repeat CT head showed Evolving recent infarcts of the left temporal and occipital lobes and left thalamus with mild mass effect and no hemorrhage.    PT Comments    Pt alert, extended time needed to answer questions, pt did appear more fatigued today than last session. Seen with PT/OT con-treat to maximize function and safety as well as patient participation. More movement noted in RUE today; able to initiate propping in sitting, some improved righting reactions from R as well in sitting, fair balance with CGA throughout. PT able to initiate anterior scoot at EOB and modA to transfer supine to sit. maxAx2 to laterally scoot to recliner. Pt initiated anterior lean as well as LUE pushing. Once in chair pt able to lean forward several times with cues and RUE set up, as well as perform LAQ (AAROM). Pt left with OT at bedside at end of session. The patient would benefit from further skilled PT intervention to continue to progress towards goals. Recommendation remains appropriate.     Recommendations for follow up therapy are one component of a multi-disciplinary discharge planning process, led by the attending physician.  Recommendations may be updated based on patient status, additional functional criteria and insurance authorization.  Follow  Up Recommendations  Acute inpatient rehab (3hours/day)     Assistance Recommended at Discharge Intermittent Supervision/Assistance  Patient can return home with the following Assist for transportation;Help with stairs or ramp for entrance;Two people to help with walking and/or transfers;Two people to help with bathing/dressing/bathroom   Equipment Recommendations  Other (comment) (TBD at next venue of care)    Recommendations for Other Services       Precautions / Restrictions Precautions Precautions: Fall Restrictions Weight Bearing Restrictions: Yes RLE Weight Bearing: Non weight bearing     Mobility  Bed Mobility Overal bed mobility: Needs Assistance Bed Mobility: Supine to Sit     Supine to sit: Mod assist, HOB elevated     General bed mobility comments: step by step vcs throughout    Transfers Overall transfer level: Needs assistance   Transfers: Bed to chair/wheelchair/BSC            Lateral/Scoot Transfers: Max assist, +2 physical assistance General transfer comment: pt with improved ability to prop with RUE today, able to anteriorly lean in preparation for transfer, able to push with LUE    Ambulation/Gait                   Stairs             Wheelchair Mobility    Modified Rankin (Stroke Patients Only)       Balance Overall balance assessment: Needs assistance Sitting-balance support: No upper extremity supported, Feet supported Sitting balance-Leahy Scale: Fair Sitting balance - Comments: CGA, pt able to scoot anteriorly, able to correct LOB to L back to midline with CGA, improved ability  from R LOB to midline but still required assist                                    Cognition Arousal/Alertness: Awake/alert, Lethargic Behavior During Therapy: Flat affect   Area of Impairment: Attention, Memory, Following commands, Safety/judgement, Awareness, Problem solving                   Current Attention  Level: Focused Memory: Decreased recall of precautions, Decreased short-term memory Following Commands: Follows one step commands inconsistently Safety/Judgement: Decreased awareness of safety, Decreased awareness of deficits Awareness: Intellectual Problem Solving: Slow processing, Decreased initiation, Difficulty sequencing, Requires verbal cues, Requires tactile cues          Exercises Other Exercises Other Exercises: seated LAQ x6 on LLE AAROM, x5 on RLE with constant, tactile, verbal and visual cues, simple. pt needed encouragement to complete    General Comments        Pertinent Vitals/Pain Pain Assessment Pain Assessment: Faces Faces Pain Scale: Hurts a little bit Pain Location: with R leg movement Pain Descriptors / Indicators: Grimacing Pain Intervention(s): Monitored during session, Repositioned, Limited activity within patient's tolerance    Home Living                          Prior Function            PT Goals (current goals can now be found in the care plan section) Progress towards PT goals: Progressing toward goals    Frequency    7X/week      PT Plan Current plan remains appropriate    Co-evaluation              AM-PAC PT "6 Clicks" Mobility   Outcome Measure  Help needed turning from your back to your side while in a flat bed without using bedrails?: A Lot Help needed moving from lying on your back to sitting on the side of a flat bed without using bedrails?: A Lot Help needed moving to and from a bed to a chair (including a wheelchair)?: A Lot Help needed standing up from a chair using your arms (e.g., wheelchair or bedside chair)?: A Lot Help needed to walk in hospital room?: Total Help needed climbing 3-5 steps with a railing? : Total 6 Click Score: 10    End of Session Equipment Utilized During Treatment: Gait belt Activity Tolerance: Patient tolerated treatment well;Patient limited by fatigue Patient left: with call  bell/phone within reach;with chair alarm set;in chair;Other (comment) (with OT at bedside) Nurse Communication: Mobility status PT Visit Diagnosis: Other abnormalities of gait and mobility (R26.89);Muscle weakness (generalized) (M62.81);Pain Pain - Right/Left: Right Pain - part of body: Ankle and joints of foot     Time: 1102-1120 PT Time Calculation (min) (ACUTE ONLY): 18 min  Charges:  $Therapeutic Activity: 8-22 mins                     Olga Coaster PT, DPT 12:08 PM,01/29/22

## 2022-01-29 NOTE — TOC Progression Note (Addendum)
Transition of Care [facility]) - Progression Note    Patient Details  Name: [name] Gilbert MRN: [id number] Date of Birth: April 05, 1961  [facility] CM/SW Contact  Bing Quarry, RN Phone Number: 01/29/2022, 11:16 AM  Clinical Narrative: 5/21:1120 am: Spoke with spouse, [name] Gilbert, at [phone]. Patient has some concerns about being far away from family and wondered if there was a similar [facility] inpatient facility in [place]. I explained that there was not but would be happy to have someone from CIR speak to patient again. RN CM explained differences in level of rehab daily and general environment between CIR and a SNF with rehab services. Spouse requests someone from either CM or CIR meet with family face to face between 9 and 10 am Monday, 01/30/22. Daughter and spouse will be present. Daughter is to call RN CM today, and spouse to give her the call back number to discuss with her. Patient expected to be discharged/admitted to CIR 01/30/22. Will relay to weekday CM. Gabriel Cirri RN CM   Update: Have not heard from daughter yet, but relayed information to CIR Rehab Admission Coordinator via secure chat. Gabriel Cirri RN CM        Expected Discharge Plan and Services                                                 Social Determinants of Health (SDOH) Interventions    Readmission Risk Interventions     View : No data to display.

## 2022-01-29 NOTE — Progress Notes (Signed)
Subjective: Right arm is slightly better today.   Exam: Vitals:   01/29/22 0444 01/29/22 0818  BP: (!) 125/58 125/62  Pulse: 73 70  Resp: 17 17  Temp: 98 F (36.7 C) 98.5 F (36.9 C)  SpO2: 100% 95%   Gen: In bed, NAD Resp: non-labored breathing, no acute distress Abd: soft, nt  Neuro: MS: awake, alert, interactive and appropriate CN:R hemianopia., does not show teeth well when asked.  Motor: she is able to lift her right arm against gravity, but still with significant right hand weakness. 4/5 throughout left side.   Pertinent Labs: LDL 214  Impression: 61 year old female with some fluctuation in the setting of recent stroke. I suspect that she will need prolonged permissive hypertension. 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 I would favor prolonged permissive hypertension for now.   Recommendations: 1) hold anti-hypertensives until 5/26, I would restart one at a time, giving a few days between re-introductions. Could use PRNs for BP > 180/100 2) Pt, OT,ST 3) Neurology will be available as needed.   Roland Rack, MD [facility] [phone]  If 7pm- 7am, please page neurology on call as listed in [place].

## 2022-01-30 DIAGNOSIS — I639 Cerebral infarction, unspecified: Secondary | ICD-10-CM | POA: Diagnosis not present

## 2022-01-30 LAB — GLUCOSE, CAPILLARY
Glucose-Capillary: 250 mg/dL — ABNORMAL HIGH (ref 70–99)
Glucose-Capillary: 266 mg/dL — ABNORMAL HIGH (ref 70–99)
Glucose-Capillary: 306 mg/dL — ABNORMAL HIGH (ref 70–99)
Glucose-Capillary: 165 mg/dL — ABNORMAL HIGH (ref 70–99)

## 2022-01-30 LAB — BASIC METABOLIC PANEL
Anion gap: 7 (ref 5–15)
BUN: 47 mg/dL — ABNORMAL HIGH (ref 8–23)
CO2: 25 mmol/L (ref 22–32)
Calcium: 8.1 mg/dL — ABNORMAL LOW (ref 8.9–10.3)
Chloride: 102 mmol/L (ref 98–111)
Creatinine, Ser: 2.42 mg/dL — ABNORMAL HIGH (ref 0.44–1.00)
GFR, Estimated: 22 mL/min — ABNORMAL LOW (ref >60)
Glucose, Bld: 312 mg/dL — ABNORMAL HIGH (ref 70–99)
Potassium: 4.8 mmol/L (ref 3.5–5.1)
Sodium: 134 mmol/L — ABNORMAL LOW (ref 135–145)

## 2022-01-30 MED ORDER — CARVEDILOL 6.25 MG PO TABS
6.2500 mg | ORAL_TABLET | Freq: Two times a day (BID) | ORAL | Status: DC
  Administered 2022-01-30: 6.25 mg via ORAL
  Filled 2022-01-30: qty 1

## 2022-01-30 MED ORDER — ENOXAPARIN SODIUM 30 MG/0.3ML IJ SOSY
30.0000 mg | PREFILLED_SYRINGE | INTRAMUSCULAR | Status: DC
  Administered 2022-01-30 – 2022-02-01 (×3): 30 mg via SUBCUTANEOUS
  Filled 2022-01-30 (×3): qty 0.3

## 2022-01-30 MED ORDER — INSULIN GLARGINE-YFGN 100 UNIT/ML ~~LOC~~ SOLN
30.0000 [IU] | Freq: Every day | SUBCUTANEOUS | Status: DC
  Filled 2022-01-30: qty 0.3

## 2022-01-30 NOTE — Progress Notes (Incomplete)
PMR Admission Coordinator Pre-Admission Assessment   Patient: [name] Gilbert is an 61 y.o., female MRN: [id number] DOB: 28-Jul-1961 Height: 5\' 2"  (157.5 cm) Weight: 74.2 kg   Insurance Information HMO: yes    PPO:      PCP:      IPA:      80/20: ***     OTHER:  PRIMARY: [employer]      Policy#: [id number]      Subscriber: patient CM Name: ***      Phone#: ***     Fax#: *** Pre-Cert#: ***      Employer: *** Benefits:  Phone #: ***     Name: *** Irene Shipper. Date: ***     Deduct: ***      Out of Pocket Max: ***      Life Max: *** CIR: ***      SNF: *** Outpatient: ***     Co-Pay: *** Home Health: ***      Co-Pay: *** DME: ***     Co-Pay: *** Providers: in-network SECONDARY:       Policy#:      Phone#:    Financial Counselor:       Phone#:    The Engineer, petroleum" for patients in Inpatient Rehabilitation Facilities with attached "Privacy Act Malcolm Records" was provided and verbally reviewed with: {CHL IP Patient Family TD:[id number]   Emergency Contact Information Contact Information       Name Relation Home Work Mobile    Day,Robert Spouse [phone]               Current Medical History  Patient Admitting Diagnosis: CVA History of Present Illness: Pt is a 61 year old female with medical hx significant for: DM1, CVA with left-sided deficits, HTN, CKD. Pt presented to [facility] on 01/25/[facility] after a fall. Pt reported worsening weakness on left side of body over previous 2 days. Also c/o left hip pain. Pt did not recall if she hit her head. CT head showed acute/subacute left temporal lobe infarct. 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 MRA showed left P1 occlusion. Orthopedics was consulted d/t pt c/o right ankle pain. X-rays revealed minimally displaced right distal fibular fx. No surgical intervention recommended. Pt was placed in posterior splint with sugar-tong supplement, maintaining ankle in neutral dorsiflexion. She is  NWB on RLE. Repeat CT head on 01/27/22, d/t right UE increasing weakness, showed evolving recent infarcts of left temporal and occipital lobes and left thalamus with mild mass effect and no hemorrhage. Therapy evaluations completed and CIR recommended d/t pt's deficits in functional mobility and inability to complete ADLs independently. Complete NIHSS TOTAL: 12   Patient's medical record from [facility] has been reviewed by the rehabilitation admission coordinator and physician.   Past Medical History      Past Medical History:  Diagnosis Date   Diabetes mellitus without complication ([place])     Hypertension     Stroke ([place])     visual impaired        Has the patient had major surgery during 100 days prior to admission? No   Family History   family history includes Diabetes in her father.   Current Medications   Current Facility-Administered Medications:     stroke: early stages of recovery book, , Does not apply, Once, [name], MD   acetaminophen (TYLENOL) tablet 650 mg, 650 mg, Oral, Q4H PRN **OR** acetaminophen (TYLENOL) 160 MG/5ML solution 650 mg, 650 mg,  Per Tube, Q4H PRN **OR** acetaminophen (TYLENOL) suppository 650 mg, 650 mg, Rectal, Q4H PRN, [name], MD   aspirin chewable tablet 81 mg, 81 mg, Oral, q morning, [name], MD, 81 mg at 01/30/22 0846   [COMPLETED] clopidogrel (PLAVIX) tablet 300 mg, 300 mg, Oral, Once, 300 mg at 01/25/22 2121 **AND** clopidogrel (PLAVIX) tablet 75 mg, 75 mg, Oral, Daily, [name], MD, 75 mg at 01/30/22 0845   enoxaparin (LOVENOX) injection 40 mg, 40 mg, Subcutaneous, Q24H, [name], MD, 40 mg at 01/29/22 2138   HYDROcodone-acetaminophen ([place]) 7.5-325 MG per tablet 1-2 tablet, 1-2 tablet, Oral, Q6H PRN, [name], MD, 2 tablet at 01/29/22 1516   insulin aspart (novoLOG) injection 0-5 Units, 0-5 Units, Subcutaneous, QHS, [name], MD, 4 Units at 01/28/22 2041   insulin aspart (novoLOG) injection 0-9  Units, 0-9 Units, Subcutaneous, TID WC, [name], MD, 5 Units at 01/30/22 1218   insulin glargine-yfgn (SEMGLEE) injection 15 Units, 15 Units, Subcutaneous, Daily, [name], MD, 15 Units at 01/30/22 0846   levETIRAcetam (KEPPRA) tablet 250 mg, 250 mg, Oral, BID, [name], MD, 250 mg at 01/30/22 0845   ondansetron (ZOFRAN) injection 4 mg, 4 mg, Intravenous, Q8H PRN, [name], MD   pregabalin (LYRICA) capsule 75 mg, 75 mg, Oral, BID, [name], MD, 75 mg at 01/30/22 0846   rosuvastatin (CRESTOR) tablet 40 mg, 40 mg, Oral, QHS, [name], MD, 40 mg at 01/29/22 2138   senna-docusate (Senokot-S) tablet 1 tablet, 1 tablet, Oral, QHS PRN, [name], MD, 1 tablet at 01/26/22 2114   Patients Current Diet:  Diet Order                  DIET DYS 3 Room service appropriate? Yes with Assist; Fluid consistency: Thin  Diet effective now                         Precautions / Restrictions Precautions Precautions: Fall Restrictions Weight Bearing Restrictions: Yes RLE Weight Bearing: Non weight bearing    Has the patient had 2 or more falls or a fall with injury in the past year? Yes   Prior Activity Level Limited Community (1-2x/wk): gets out of house 2-3 days/week   Prior Functional Level Self Care: Did the patient need help bathing, dressing, using the toilet or eating? Independent   Indoor Mobility: Did the patient need assistance with walking from room to room (with or without device)? Independent   Stairs: Did the patient need assistance with internal or external stairs (with or without device)? Needed some help   Functional Cognition: Did the patient need help planning regular tasks such as shopping or remembering to take medications? Independent   Patient Information   Patient's Response To:    Home Assistive Devices / Equipment Home Assistive Devices/Equipment: None Home Equipment: Conservation officer, nature (2 wheels)   Prior Device Use: Indicate devices/aids used by the  patient prior to current illness, exacerbation or injury? Gilford Rile (daughter reports pt didn't use it)   Current Functional Level Cognition   Arousal/Alertness: Awake/alert Overall Cognitive Status: Impaired/Different from baseline Current Attention Level: Focused Orientation Level: Oriented to person Following Commands: Follows one step commands inconsistently Safety/Judgement: Decreased awareness of safety, Decreased awareness of deficits General Comments: initially repsonds to all questions however as fatigues requires tactile cues to answer questions Attention: Focused Focused Attention: Impaired Memory: Appears intact Awareness: Appears intact    Extremity Assessment (includes Sensation/Coordination)   Upper Extremity  Assessment: RUE deficits/detail, LUE deficits/detail RUE Deficits / Details: AROM: shoulder flexion: unable; trace movement noted; elbow flexion: 1/2 full AROM, 1+ MAS in elbow flexion/extension, wrist flexion/extension: 3/4 full AROM; weak grip strength RUE Sensation: decreased light touch, decreased proprioception RUE Coordination: decreased fine motor, decreased gross motor LUE Deficits / Details: AROM shoulder flexion: pt able to bring to 90 degrees without tactile cueing, to full AROM in shoudler flexion with tactile cues, elbow/wrist appear WFL; midly impaired grip strength LUE Coordination: decreased fine motor  Lower Extremity Assessment: Defer to PT evaluation, RLE deficits/detail RLE Deficits / Details: pt is NWBing to RLE RLE: Unable to fully assess due to pain RLE Sensation:  (she is able to detct light touch but reports RLE sensation feels diminished compared to left) LLE Deficits / Details: WNL LLE Sensation: WNL     ADLs   Overall ADL's : Needs assistance/impaired Grooming: Wash/dry face, Maximal assistance, Cueing for sequencing Lower Body Dressing: Maximal assistance, Cueing for sequencing Toilet Transfer: Maximal assistance, Squat-pivot, +2 for  physical assistance Toilet Transfer Details (indicate cue type and reason): simulated to bedside chair step by step vcs, pt attempted to assist with LUE Toileting- Clothing Manipulation and Hygiene: Total assistance General ADL Comments: MAX A don/doff L sock at bed level. MAX A x2 for simulated BSC t/f     Mobility   Overal bed mobility: Needs Assistance Bed Mobility: Sit to Supine Supine to sit: Mod assist, HOB elevated Sit to supine: Mod assist, +2 for safety/equipment General bed mobility comments: extra time, reliant on bed rails and trunk elevation by PT as well as assistance for RLE movement     Transfers   Overall transfer level: Needs assistance Equipment used: Rolling walker (2 wheels) Transfers: Bed to chair/wheelchair/BSC Sit to Stand: Max assist, +2 safety/equipment, +2 physical assistance Bed to/from chair/wheelchair/BSC transfer type:: Lateral/scoot transfer  Lateral/Scoot Transfers: Max assist, +2 physical assistance General transfer comment: able to anteriorly lean in preparation for transfer, able to push with LUE minimally     Ambulation / Gait / Stairs / Wheelchair Mobility   Ambulation/Gait Ambulation/Gait assistance: Herbalist (Feet): 2 Feet Assistive device: Rolling walker (2 wheels) Gait Pattern/deviations: Step-to pattern, Decreased stride length, Decreased stance time - right General Gait Details: decreased weight acceptance on RLE. patient complains of some pain in the right ankle. difficulty with advancement of RLE and unable to take a step backward. further standing activity limited by fatigue Gait velocity: decreased     Posture / Balance Dynamic Sitting Balance Sitting balance - Comments: maintains midline Balance Overall balance assessment: Needs assistance Sitting-balance support: No upper extremity supported, Feet supported Sitting balance-Leahy Scale: Fair Sitting balance - Comments: maintains midline Postural control: Posterior  lean Standing balance support: During functional activity Standing balance-Leahy Scale: Zero Standing balance comment: Min A to Min guard for safety     Special needs/care consideration Diabetic management novoLOG 0-5 units daily at bedtime; novoLOG 0-9 units 3x daily with meals; Semglee 15 units daily Bladder incontinence, and External urinary catheter    Previous Home Environment (from acute therapy documentation) Living Arrangements: Spouse/significant other  Lives With: Spouse Available Help at Discharge: Family, Available 24 hours/day Type of Home: House Home Layout: One level Home Access: Stairs to enter Entrance Stairs-Rails: None Entrance Stairs-Number of Steps: 3 Bathroom Shower/Tub: Chiropodist: Standard Bathroom Accessibility: Yes How Accessible: Accessible via walker Home Care Services: No   Discharge Living Setting Plans for Discharge Living Setting: Patient's home Type  of Home at Discharge: House Discharge Home Layout: One level Discharge Home Access: Stairs to enter Entrance Stairs-Rails: None Entrance Stairs-Number of Steps: 3 Discharge Bathroom Shower/Tub: Tub/shower unit Discharge Bathroom Toilet: Standard Discharge Bathroom Accessibility: Yes How Accessible: Accessible via walker Does the patient have any problems obtaining your medications?: No   Social/Family/Support Systems Anticipated Caregiver: Herbie Baltimore Day, husband and Dela (daughter) Anticipated Caregiver's Contact Information: Herbie [id number] [phone], Dela: [phone] Caregiver Availability: 24/7 Discharge Plan Discussed with Primary Caregiver: Yes Is Caregiver In Agreement with Plan?: Yes Does Caregiver/Family have Issues with Lodging/Transportation while Pt is in Rehab?: No   Goals Patient/Family Goal for Rehab: Supervision-Min A: PT/OT Expected length of stay: 14-16 days Pt/Family Agrees to Admission and willing to participate: Yes Program Orientation Provided & Reviewed  with Pt/Caregiver Including Roles  & Responsibilities: Yes   Decrease burden of Care through IP rehab admission: NA   Possible need for SNF placement upon discharge: Not anticipated   Patient Condition: I have reviewed medical records from [facility], spoken with CM, and spouse and daughter. I discussed via phone for inpatient rehabilitation assessment.  Patient will benefit from ongoing PT, OT, and SLP, can actively participate in 3 hours of therapy a day 5 days of the week, and can make measurable gains during the admission.  Patient will also benefit from the coordinated team approach during an Inpatient Acute Rehabilitation admission.  The patient will receive intensive therapy as well as Rehabilitation physician, nursing, social worker, and care management interventions.  Due to bladder management, safety, disease management, medication administration, pain management, and patient education the patient requires 24 hour a day rehabilitation nursing.  The patient is currently *** with mobility and basic ADLs.  Discharge setting and therapy post discharge at home with home health is anticipated.  Patient has agreed to participate in the Acute Inpatient Rehabilitation Program and will admit {Time; today/tomorrow:10263}.   Preadmission Screen Completed By:  Bethel Born, 01/30/2022 2:09 PM ______________________________________________________________________

## 2022-01-30 NOTE — Progress Notes (Addendum)
Inpatient Rehab Admissions Coordinator:  Spoke with pt's daughter Sid Falcon on the telephone. She informed AC that she and [name] are in agreement with pt pursuing CIR. She confirmed that pt will have 24/7 support after discharge. Will continue to follow.  ADDENDUM 1425: Began insurance authorization.  Wolfgang Phoenix, MS, CCC-SLP Admissions Coordinator [phone]

## 2022-01-30 NOTE — PMR Pre-admission (Signed)
PMR Admission Coordinator Pre-Admission Assessment  Patient: [name] Gilbert is an 61 y.o., female MRN: [id number] DOB: 1960-10-27 Height: 5\' 2"  (157.5 cm) Weight: 74.2 kg  Insurance Information HMO: yes    PPO:      PCP:      IPA:      80/20: ***     OTHER:  PRIMARY: [employer]      Policy#: [id number]      Subscriber: patient CM Name: ***      Phone#: ***     Fax#: *** Pre-Cert#: ***      Employer: *** Benefits:  Phone #: ***     Name: *** Irene Shipper. Date: ***     Deduct: ***      Out of Pocket Max: ***      Life Max: *** CIR: ***      SNF: *** Outpatient: ***     Co-Pay: *** Home Health: ***      Co-Pay: *** DME: ***     Co-Pay: *** Providers: in-network SECONDARY:       Policy#:      Phone#:   Financial Counselor:       Phone#:   The Engineer, petroleum" for patients in Inpatient Rehabilitation Facilities with attached "Privacy Act Frontier Records" was provided and verbally reviewed with: {CHL IP Patient Family TD:[id number]  Emergency Contact Information Contact Information     Name Relation Home Work Mobile   Day,Robert Spouse [phone]         Current Medical History  Patient Admitting Diagnosis: CVA History of Present Illness: Pt is a 61 year old female with medical hx significant for: DM1, CVA with left-sided deficits, HTN, CKD. Pt presented to [facility] on 01/25/22 after a fall. Pt reported worsening weakness on left side of body over previous 2 days. Also c/o left hip pain. Pt did not recall if she hit her head. CT head showed acute/subacute left temporal lobe infarct. 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 MRA showed left P1 occlusion. Orthopedics was consulted d/t pt c/o right ankle pain. X-rays revealed minimally displaced right distal fibular fx. No surgical intervention recommended. Pt was placed in posterior splint with sugar-tong supplement, maintaining ankle in neutral dorsiflexion. She is NWB on RLE.  Repeat CT head on 01/27/22, d/t right UE increasing weakness, showed evolving recent infarcts of left temporal and occipital lobes and left thalamus with mild mass effect and no hemorrhage. Therapy evaluations completed and CIR recommended d/t pt's deficits in functional mobility and inability to complete ADLs independently. Complete NIHSS TOTAL: 12  Patient's medical record from [facility] has been reviewed by the rehabilitation admission coordinator and physician.  Past Medical History  Past Medical History:  Diagnosis Date   Diabetes mellitus without complication ([name])    Hypertension    Stroke ([name])    visual impaired     Has the patient had major surgery during 100 days prior to admission? No  Family History   family history includes Diabetes in her father.  Current Medications  Current Facility-Administered Medications:     stroke: early stages of recovery book, , Does not apply, Once, [name], MD   acetaminophen (TYLENOL) tablet 650 mg, 650 mg, Oral, Q4H PRN **OR** acetaminophen (TYLENOL) 160 MG/5ML solution 650 mg, 650 mg, Per Tube, Q4H PRN **OR** acetaminophen (TYLENOL) suppository 650 mg, 650 mg, Rectal, Q4H PRN, [name], MD   aspirin chewable tablet 81 mg, 81 mg, Oral, q  morning, [name], MD, 81 mg at 01/30/22 0846   [COMPLETED] clopidogrel (PLAVIX) tablet 300 mg, 300 mg, Oral, Once, 300 mg at 01/25/22 2121 **AND** clopidogrel (PLAVIX) tablet 75 mg, 75 mg, Oral, Daily, [name], MD, 75 mg at 01/30/22 0845   enoxaparin (LOVENOX) injection 40 mg, 40 mg, Subcutaneous, Q24H, [name], MD, 40 mg at 01/29/22 2138   HYDROcodone-acetaminophen ([place]) 7.5-325 MG per tablet 1-2 tablet, 1-2 tablet, Oral, Q6H PRN, [name], MD, 2 tablet at 01/29/22 1516   insulin aspart (novoLOG) injection 0-5 Units, 0-5 Units, Subcutaneous, QHS, [name], MD, 4 Units at 01/28/22 2041   insulin aspart (novoLOG) injection 0-9 Units, 0-9 Units,  Subcutaneous, TID WC, [name], MD, 5 Units at 01/30/22 1218   insulin glargine-yfgn (SEMGLEE) injection 15 Units, 15 Units, Subcutaneous, Daily, [name], MD, 15 Units at 01/30/22 0846   levETIRAcetam (KEPPRA) tablet 250 mg, 250 mg, Oral, BID, [name], MD, 250 mg at 01/30/22 0845   ondansetron (ZOFRAN) injection 4 mg, 4 mg, Intravenous, Q8H PRN, [name], MD   pregabalin (LYRICA) capsule 75 mg, 75 mg, Oral, BID, [name], MD, 75 mg at 01/30/22 0846   rosuvastatin (CRESTOR) tablet 40 mg, 40 mg, Oral, QHS, [name], MD, 40 mg at 01/29/22 2138   senna-docusate (Senokot-S) tablet 1 tablet, 1 tablet, Oral, QHS PRN, [name], MD, 1 tablet at 01/26/22 2114  Patients Current Diet:  Diet Order             DIET DYS 3 Room service appropriate? Yes with Assist; Fluid consistency: Thin  Diet effective now                   Precautions / Restrictions Precautions Precautions: Fall Restrictions Weight Bearing Restrictions: Yes RLE Weight Bearing: Non weight bearing   Has the patient had 2 or more falls or a fall with injury in the past year? Yes  Prior Activity Level Limited Community (1-2x/wk): gets out of house 2-3 days/week  Prior Functional Level Self Care: Did the patient need help bathing, dressing, using the toilet or eating? Independent  Indoor Mobility: Did the patient need assistance with walking from room to room (with or without device)? Independent  Stairs: Did the patient need assistance with internal or external stairs (with or without device)? Needed some help  Functional Cognition: Did the patient need help planning regular tasks such as shopping or remembering to take medications? Independent  Patient Information    Patient's Response To:     Home Assistive Devices / Equipment Home Assistive Devices/Equipment: None Home Equipment: Conservation officer, nature (2 wheels)  Prior Device Use: Indicate devices/aids used by the patient prior to current illness,  exacerbation or injury? Gilford Rile (daughter reports pt didn't use it)  Current Functional Level Cognition  Arousal/Alertness: Awake/alert Overall Cognitive Status: Impaired/Different from baseline Current Attention Level: Focused Orientation Level: Oriented to person Following Commands: Follows one step commands inconsistently Safety/Judgement: Decreased awareness of safety, Decreased awareness of deficits General Comments: initially repsonds to all questions however as fatigues requires tactile cues to answer questions Attention: Focused Focused Attention: Impaired Memory: Appears intact Awareness: Appears intact    Extremity Assessment (includes Sensation/Coordination)  Upper Extremity Assessment: RUE deficits/detail, LUE deficits/detail RUE Deficits / Details: AROM: shoulder flexion: unable; trace movement noted; elbow flexion: 1/2 full AROM, 1+ MAS in elbow flexion/extension, wrist flexion/extension: 3/4 full AROM; weak grip strength RUE Sensation: decreased light touch, decreased proprioception RUE Coordination: decreased fine motor, decreased gross motor LUE  Deficits / Details: AROM shoulder flexion: pt able to bring to 90 degrees without tactile cueing, to full AROM in shoudler flexion with tactile cues, elbow/wrist appear WFL; midly impaired grip strength LUE Coordination: decreased fine motor  Lower Extremity Assessment: Defer to PT evaluation, RLE deficits/detail RLE Deficits / Details: pt is NWBing to RLE RLE: Unable to fully assess due to pain RLE Sensation:  (she is able to detct light touch but reports RLE sensation feels diminished compared to left) LLE Deficits / Details: WNL LLE Sensation: WNL    ADLs  Overall ADL's : Needs assistance/impaired Grooming: Wash/dry face, Maximal assistance, Cueing for sequencing Lower Body Dressing: Maximal assistance, Cueing for sequencing Toilet Transfer: Maximal assistance, Squat-pivot, +2 for physical assistance Toilet Transfer  Details (indicate cue type and reason): simulated to bedside chair step by step vcs, pt attempted to assist with LUE Toileting- Clothing Manipulation and Hygiene: Total assistance General ADL Comments: MAX A don/doff L sock at bed level. MAX A x2 for simulated BSC t/f    Mobility  Overal bed mobility: Needs Assistance Bed Mobility: Sit to Supine Supine to sit: Mod assist, HOB elevated Sit to supine: Mod assist, +2 for safety/equipment General bed mobility comments: extra time, reliant on bed rails and trunk elevation by PT as well as assistance for RLE movement    Transfers  Overall transfer level: Needs assistance Equipment used: Rolling walker (2 wheels) Transfers: Bed to chair/wheelchair/BSC Sit to Stand: Max assist, +2 safety/equipment, +2 physical assistance Bed to/from chair/wheelchair/BSC transfer type:: Lateral/scoot transfer  Lateral/Scoot Transfers: Max assist, +2 physical assistance General transfer comment: able to anteriorly lean in preparation for transfer, able to push with LUE minimally    Ambulation / Gait / Stairs / Wheelchair Mobility  Ambulation/Gait Ambulation/Gait assistance: Herbalist (Feet): 2 Feet Assistive device: Rolling walker (2 wheels) Gait Pattern/deviations: Step-to pattern, Decreased stride length, Decreased stance time - right General Gait Details: decreased weight acceptance on RLE. patient complains of some pain in the right ankle. difficulty with advancement of RLE and unable to take a step backward. further standing activity limited by fatigue Gait velocity: decreased    Posture / Balance Dynamic Sitting Balance Sitting balance - Comments: maintains midline Balance Overall balance assessment: Needs assistance Sitting-balance support: No upper extremity supported, Feet supported Sitting balance-Leahy Scale: Fair Sitting balance - Comments: maintains midline Postural control: Posterior lean Standing balance support: During  functional activity Standing balance-Leahy Scale: Zero Standing balance comment: Min A to Min guard for safety    Special needs/care consideration Diabetic management novoLOG 0-5 units daily at bedtime; novoLOG 0-9 units 3x daily with meals; Semglee 15 units daily Bladder incontinence, and External urinary catheter   Previous Home Environment (from acute therapy documentation) Living Arrangements: Spouse/significant other  Lives With: Spouse Available Help at Discharge: Family, Available 24 hours/day Type of Home: House Home Layout: One level Home Access: Stairs to enter Entrance Stairs-Rails: None Entrance Stairs-Number of Steps: 3 Bathroom Shower/Tub: Optometrist: Yes How Accessible: Accessible via walker Home Care Services: No  Discharge Living Setting Plans for Discharge Living Setting: Patient's home Type of Home at Discharge: House Discharge Home Layout: One level Discharge Home Access: Stairs to enter Entrance Stairs-Rails: None Entrance Stairs-Number of Steps: 3 Discharge Bathroom Shower/Tub: Tub/shower unit Discharge Bathroom Toilet: Standard Discharge Bathroom Accessibility: Yes How Accessible: Accessible via walker Does the patient have any problems obtaining your medications?: No  Social/Family/Support Systems Anticipated Caregiver: Herbie [place] Day, husband and Forensic psychologist (  daughter) Anticipated Caregiver's Contact Information: Herbie [id number] [phone], Dela: [phone] Caregiver Availability: 24/7 Discharge Plan Discussed with Primary Caregiver: Yes Is Caregiver In Agreement with Plan?: Yes Does Caregiver/Family have Issues with Lodging/Transportation while Pt is in Rehab?: No  Goals Patient/Family Goal for Rehab: Supervision-Min A: PT/OT Expected length of stay: 14-16 days Pt/Family Agrees to Admission and willing to participate: Yes Program Orientation Provided & Reviewed with Pt/Caregiver Including Roles  &  Responsibilities: Yes  Decrease burden of Care through IP rehab admission: NA  Possible need for SNF placement upon discharge: Not anticipated  Patient Condition: I have reviewed medical records from [facility], spoken with CM, and spouse and daughter. I discussed via phone for inpatient rehabilitation assessment.  Patient will benefit from ongoing PT, OT, and SLP, can actively participate in 3 hours of therapy a day 5 days of the week, and can make measurable gains during the admission.  Patient will also benefit from the coordinated team approach during an Inpatient Acute Rehabilitation admission.  The patient will receive intensive therapy as well as Rehabilitation physician, nursing, social worker, and care management interventions.  Due to bladder management, safety, disease management, medication administration, pain management, and patient education the patient requires 24 hour a day rehabilitation nursing.  The patient is currently *** with mobility and basic ADLs.  Discharge setting and therapy post discharge at home with home health is anticipated.  Patient has agreed to participate in the Acute Inpatient Rehabilitation Program and will admit {Time; today/tomorrow:10263}.  Preadmission Screen Completed By:  Bethel Born, 01/30/2022 2:09 PM ______________________________________________________________________   Discussed status with Dr. Marland Kitchen on *** at *** and received approval for admission today.  Admission Coordinator:  Bethel Born, CCC-SLP, time ***/Date ***   Assessment/Plan: Diagnosis: Does the need for close, 24 hr/day Medical supervision in concert with the patient's rehab needs make it unreasonable for this patient to be served in a less intensive setting? {yes_no_potentially:3041433} Co-Morbidities requiring supervision/potential complications: *** Due to {due WC:4653188, does the patient require 24 hr/day rehab nursing?  {yes_no_potentially:3041433} Does the patient require coordinated care of a physician, rehab nurse, PT, OT, and SLP to address physical and functional deficits in the context of the above medical diagnosis(es)? {yes_no_potentially:3041433} Addressing deficits in the following areas: {deficits:3041436} Can the patient actively participate in an intensive therapy program of at least 3 hrs of therapy 5 days a week? {yes_no_potentially:3041433} The potential for patient to make measurable gains while on inpatient rehab is {potential:3041437} Anticipated functional outcomes upon discharge from inpatient rehab: {functional outcomes:304600100} PT, {functional outcomes:304600100} OT, {functional outcomes:304600100} SLP Estimated rehab length of stay to reach the above functional goals is: *** Anticipated discharge destination: {anticipated dc setting:21604} 10. Overall Rehab/Functional Prognosis: {potential:3041437}   MD Signature: ***

## 2022-01-30 NOTE — Progress Notes (Signed)
[facility] [place] NAME: [name] Gilbert    MR#:  [id number]  DATE OF BIRTH:  1961/05/22  SUBJECTIVE:   Patient more awake today. Able to move right upper extremity well. Feels warm to touch. No  new issues Pt encouraged to work with PT VITALS:  Blood pressure (!) 159/69, pulse 74, temperature 98.2 F (36.8 C), temperature source Oral, resp. rate 17, height 5\' 2"  (1.575 m), weight 74.2 kg, SpO2 100 %.  PHYSICAL EXAMINATION:   GENERAL:  61 y.o.-year-old patient lying in the bed with no acute distress. Deconditioned LUNGS: Normal breath sounds bilaterally, no wheezing, rales, rhonchi.  CARDIOVASCULAR: S1, S2 normal. No murmurs, rubs, or gallops.  ABDOMEN: Soft, nontender, nondistended. Bowel sounds present.  EXTREMITIES: right leg splint+ NEUROLOGIC: nonfocal  patient is alert and awake right sided hemiparesis with more prominent right upper extremity weakness--improving    LABORATORY PANEL:  CBC Recent Labs  Lab 01/25/22 0611  WBC 7.5  HGB 12.2  HCT 36.9  PLT 244     Chemistries  Recent Labs  Lab 01/25/22 0611  NA 132*  K 4.2  CL 96*  CO2 27  GLUCOSE 261*  BUN 27*  CREATININE 1.71*  CALCIUM 8.5*  AST 27  ALT 14  ALKPHOS 99  BILITOT 0.9    Cardiac Enzymes No results for input(s): TROPONINI in the last 168 hours. RADIOLOGY:  No results found.  Assessment and Plan  [name] Gilbert is a 61 y.o. 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  Pt states that she had history of 3 strokes in the past.  She has chronic left-sided weakness.  She states that she has been feeling weaker in the past 2 days.  She feels like her left-sided weakness has worsened. She fell in the early morning. No LOC per her husband.   CT-head: 1. Relatively large  acute to subacute infarct of the posterior and mesial Left temporal lobe, new since February. 2. No hemorrhagic transformation or significant mass effect. 3. Otherwise stable advanced underlying chronic ischemic disease. 4. No acute traumatic injury identified  MRI:  1. Confluent acute infarct in the medial left temporal lobe with additional punctate infarct in the left thalamus. Associated edema without significant mass effect. 2. Remote infarcts and moderate chronic microvascular disease.  MRA Head:  1. Left P1 PCA occlusion, previously severely stenotic. 2. Similar severe distal M1/proximal M2 MCA stenosis bilaterally. Similar mild to moderate right A1 ACA stenosis.  MRA Neck:  No evidence of significant (greater than 50%) stenosis  Acute CVA recurrent, left temporal lobe -- Patient states that she had 3 strokes in the past.  Patient has chronic left-sided weakness, which has worsened.   --CT head showed relatively large acute to subacute infarct of the posterior and mesial Left temporal lobe, which is new since February. -- Seen by physical therapy recommends rehab -- seen by neurology recommends Aspirin - dose 81mg  and plavix 75mg  daily  for 90 days. Patient got loading dose of Plavix --CT head today due to right UE increasing weakness --5/19--addendum repeat CT head showed Evolving recent infarcts of the left temporal and occipital lobes and left thalamus with mild mass effect and no hemorrhage.   Right distal fibular oblique fracture status post fall -- PRN pain meds -- orthopedic consultation with Dr. Poggi--s/p splint. Right LE NWB  Hypertension --  home meds-- amlodipine, hydralazine, Coreg isinopril and hydrochlorothiazide  --holding BP meds for now to allow permissive HTN. Received IVF --since BP rising will start coreg 6.25 mg bid  type II diabetes, uncontrolled with hyperlipidemia -- resume insulin and sliding scale -- continue statins -- patient is apparently on  regular diet since she does not eat hospital food. She is very picky about her food according to her family. For her to get nutrition I have allowed regular food. 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 Patient is medically improving for discharge    TOTAL TIME TAKING CARE OF THIS PATIENT: 35 minutes.  >50% time spent on counselling and coordination of care  Note: This dictation was prepared with Dragon dictation along with smaller phrase technology. Any transcriptional errors that result from this process are unintentional.  [name] Gilbert M.D    [facility]   CC: Primary care physician; Christella Scheuermann, MD

## 2022-01-30 NOTE — Progress Notes (Signed)
Occupational Therapy Treatment Patient Details Name: [name] Gilbert MRN: [id number] DOB: 1960-12-28 Today's Date: 01/30/2022   History of present illness Patient is a 61 year old female with  a history of diabetes, hypertension as well as previous stroke who presents with difficulty speaking and increasing weakness.  Brain MRI reports confluent acute infarct in the medial left temporal lobe with  additional punctate infarct in the left thalamus, Associated edema  without significant mass effect, and remote infarcts. Of note, pt is now NWBing on RLE due to Right distal fibular oblique fracture status post fall and rapid response called 5/19 due to decreased RUE function, repeat CT head showed Evolving recent infarcts of the left temporal and occipital lobes and left thalamus with mild mass effect and no hemorrhage.   OT comments  Ms Mckiver was seen for OT treatment on this date. Upon arrival to room pt reclined in chair, family at bedside requesting pt return to bed. Pt requires MAX A x2 squat pivot t/f, +2 assist to maintain RLE NWBing. Attempted seated exercises and pt requesting to complete in supine. Follows commands for transfers and scooting up in bed however as pt fatigues during supine exercises pt requires tactile cues to answer questions. Completes supine RUE therex as described below. Pt making progress toward goals. Will continue to follow POC. Discharge recommendation remains appropriate, will re-assess next session.    Recommendations for follow up therapy are one component of a multi-disciplinary discharge planning process, led by the attending physician.  Recommendations may be updated based on patient status, additional functional criteria and insurance authorization.    Follow Up Recommendations  Acute inpatient rehab (3hours/day)    Assistance Recommended at Discharge Frequent or constant Supervision/Assistance  Patient can return home with the following  Help with stairs or ramp  for entrance;Two people to help with walking and/or transfers;Two people to help with bathing/dressing/bathroom   Equipment Recommendations  Hospital bed    Recommendations for Other Services      Precautions / Restrictions Precautions Precautions: Fall Restrictions Weight Bearing Restrictions: Yes RLE Weight Bearing: Non weight bearing       Mobility Bed Mobility Overal bed mobility: Needs Assistance Bed Mobility: Sit to Supine       Sit to supine: Mod assist, +2 for safety/equipment        Transfers Overall transfer level: Needs assistance   Transfers: Bed to chair/wheelchair/BSC            Lateral/Scoot Transfers: Max assist, +2 physical assistance General transfer comment: able to anteriorly lean in preparation for transfer, able to push with LUE minimally     Balance Overall balance assessment: Needs assistance Sitting-balance support: No upper extremity supported, Feet supported Sitting balance-Leahy Scale: Fair                                     ADL either performed or assessed with clinical judgement   ADL Overall ADL's : Needs assistance/impaired                                       General ADL Comments: MAX A don/doff L sock at bed level. MAX A x2 for simulated BSC t/f     Vision   Additional Comments: unable to follow commands for vision assessment - correctly ID # of fingers in L  field of vision.          Cognition Arousal/Alertness: Awake/alert, Lethargic Behavior During Therapy: Flat affect Overall Cognitive Status: Impaired/Different from baseline Area of Impairment: Attention, Memory, Following commands, Safety/judgement, Awareness, Problem solving                   Current Attention Level: Focused Memory: Decreased recall of precautions, Decreased short-term memory Following Commands: Follows one step commands inconsistently Safety/Judgement: Decreased awareness of safety, Decreased  awareness of deficits Awareness: Intellectual Problem Solving: Slow processing, Decreased initiation, Difficulty sequencing, Requires verbal cues, Requires tactile cues General Comments: initially repsonds to all questions however as fatigues requires tactile cues to answer questions        Exercises Other Exercises Other Exercises: supine AAROM RUE wrist extension, bicep flexion/extension            Pertinent Vitals/ Pain       Pain Assessment Pain Assessment: Faces Faces Pain Scale: Hurts a little bit Pain Location: with R knee flexion Pain Descriptors / Indicators: Grimacing   Frequency  Min 4X/week        Progress Toward Goals  OT Goals(current goals can now be found in the care plan section)  Progress towards OT goals: OT to reassess next treatment  Acute Rehab OT Goals Patient Stated Goal: to go home OT Goal Formulation: With patient Time For Goal Achievement: 02/11/22 Potential to Achieve Goals: Good ADL Goals Pt Will Perform Grooming: with min assist;standing;sitting Pt Will Perform Lower Body Dressing: with min assist;sit to/from stand Pt Will Transfer to Toilet: with min assist;stand pivot transfer;bedside commode  Plan Discharge plan remains appropriate;Frequency remains appropriate    Co-evaluation                 AM-PAC OT "6 Clicks" Daily Activity     Outcome Measure   Help from another person eating meals?: A Lot Help from another person taking care of personal grooming?: A Lot Help from another person toileting, which includes using toliet, bedpan, or urinal?: A Lot Help from another person bathing (including washing, rinsing, drying)?: A Lot Help from another person to put on and taking off regular upper body clothing?: A Lot Help from another person to put on and taking off regular lower body clothing?: A Lot 6 Click Score: 12    End of Session    OT Visit Diagnosis: Muscle weakness (generalized) (M62.81);Other symptoms and signs  involving cognitive function   Activity Tolerance Patient tolerated treatment well   Patient Left in bed;with call bell/phone within reach;with bed alarm set;with family/visitor present   Nurse Communication          Time: [id number] OT Time Calculation (min): 26 min  Charges: OT General Charges $OT Visit: 1 Visit OT Treatments $Self Care/Home Management : 8-22 mins $Therapeutic Exercise: 8-22 mins  Dessie Coma, M.S. OTR/L  01/30/22, 1:56 PM  ascom [phone]

## 2022-01-30 NOTE — Plan of Care (Signed)

## 2022-01-30 NOTE — Plan of Care (Signed)
?  Problem: Education: ?Goal: Knowledge of disease or condition will improve ?Outcome: Progressing ?Goal: Knowledge of secondary prevention will improve (SELECT ALL) ?Outcome: Progressing ?Goal: Knowledge of patient specific risk factors will improve (INDIVIDUALIZE FOR PATIENT) ?Outcome: Progressing ?  ?

## 2022-01-30 NOTE — Progress Notes (Signed)
Speech Language Pathology Treatment: Dysphagia  Patient Details Name: [name] Gilbert MRN: [id number] DOB: 1960-12-30 Today's Date: 01/30/2022 Time: 1225-1310 SLP Time Calculation (min) (ACUTE ONLY): 45 min  Assessment / Plan / Recommendation Clinical Impression  Pt admitted to this facility with difficulty speaking and increasing weakness.  Brain MRI reports confluent, acute infarct in the medial left temporal lobe with additional punctate infarct in the left thalamus and associated edema without significant mass effect; remote infarcts. Of note, pt is now NWBing on RLE due to Right distal fibular oblique fracture status post fall and rapid response called 5/19 due to decreased RUE function. A repeat CT Head on 5/19 showed Evolving recent infarcts of the left temporal and occipital lobes and left thalamus with mild mass effect and no hemorrhage.  ST services initially completed a Cognitive-linguistic evaluation post admit revealing: "Pt presents with mild to moderate expressive and receptive deficits. Husband previously reported that speech is at [facility] from previous strokes.". Per evaluation recommendations, "Pt may benefit from ST services at discharge for high level expressive and receptive tasks.".  OF NOTE: pt's Daughter stated pt has "Dementia" at baseline; and a "stubborn" personality. Suspect impact from both in setting of new CVA, old CVAs.   Pt seen for ongoing assessment of swallowing this session. Recommend f/u for Cognitive-linguistic needs at next venue of care in a more structured setting.  She was in bed wanting to "sleep" post OT session(not overly participatory in session per report). When offered items to drink and food placed in front of her from the Lunch meal, she awakened and asked for the Cranberry Juice and sandwich. OF NOTE: when pt had eaten/drunk what she wanted(~6 ozs thins; 1/4 sandwich), she refused further po's despite verbal encouragement from Husband and this  Clinician.  She was verbal w/ low volume of speech -- unsure if related to Cognitive decline d/t observing fluctuating volumes of speech depending on who she spoke with/to(Dtr vs this SLP). She didn't want the bed positioned Upright for eating the Lunch meal-- education/instruction given on need to position the bed Upright for safe eating/drinking.   Pt explained general aspiration precautions and the need for following them especially sitting upright for all oral intake -- supported behind the back for more upright, head forward sitting w/ mild verbal resistance from pt. Pt assisted w/ positioning d/t Cognitive decline suspected. She fed herself meal items of sandwich (soft solids) and thin liquids via straw. NO overt clinical s/s of aspiration were noted w/ any consistency; respiratory status remained calm and unlabored, vocal quality clear b/t trials; no coughing. Oral phase appeared [facility] w/ trials of liquids; slight increased oral phase time w/ soft solids d/t mastication and fully clearing mouth of sandwich but overall, grossly WFL for bolus management and A-P transfer for swallowing. Instruction/education given on aiding oral clearing by alternating food and liquid boluses -- Husband present and agreed to monitor. NSG denied any deficits in swallowing as well w/ pills, meals this morning.   Pt appears to present w/ slight-mild oral phase dysphagia(w/ solids) -- suspect in setting of Cognitive decline(pt has baseline of Dementia as per Daughter). She appears at reduced risk for aspiration when following general aspiration precautions including sitting Upright for oral intake -- pt's Family stated she eats in bed at home(as she requests to -- she has stated that if she cooks it, she will eat at the table?).  Recommend continue a more Mech soft diet for ease of soft, cut foods w/ gravies added to  moisten foods; Thin liquids. Recommend general aspiration precautions; Pills Whole in Puree if needed; tray setup  and positioning assistance for meals. Education completed w/ Family. ST services will sign off at this time for dysphagia needs w/ MD to reconsult if needed while admitted. NSG updated. Precautions posted at bedside.   Recommend continued f/u at next venue of care ([facility] potentially) for Cognitive-linguistic needs in setting of Cognitive decline report, new CVA.      HPI HPI: Per admitting H & P "[name] Gilbert is a 61 y.o. 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     Pt states that she had history of 3 strokes in the past.  She has chronic left-sided weakness.  She states that she has been feeling weaker in the past 2 days.  She feels like her left-sided weakness has worsened. She fell in the early morning. No LOC per her husband.  Denies headache or neck pain.  She has mild left hip pain.  Patient does not have facial droop or slurred speech.  Denies chest pain, cough, shortness breath, no fever or chills.  Denies nausea vomiting, diarrhea or abdominal pain. Denies symptoms of UTI.  She states that her blood pressure has been poorly controlled recently.  Her blood pressure is at 204/98 in ED. Per chart review, patient was started on Keppra since last stroke.  She denies history of seizure."      SLP Plan  All goals met re: swallowing; education w/ family/pt completed.      Recommendations for follow up therapy are one component of a multi-disciplinary discharge planning process, led by the attending physician.  Recommendations may be updated based on patient status, additional functional criteria and insurance authorization.    Recommendations  Diet recommendations: Dysphagia 3 (mechanical soft) (for ease of cut, moistened foods) Liquids provided via: Straw;Cup (monitor as  needed) Medication Administration: Whole meds with puree (as needed for safer swallowing) Supervision: Patient able to self feed;Intermittent supervision to cue for compensatory strategies Compensations: Minimize environmental distractions;Slow rate;Small sips/bites;Lingual sweep for clearance of pocketing;Follow solids with liquid (ensure oral clearing at end of meals) Postural Changes and/or Swallow Maneuvers: Out of bed for meals;Seated upright 90 degrees;Upright 30-60 min after meal                General recommendations:  (PT/OT following) Oral Care Recommendations: Oral care BID;Oral care before and after PO;Patient independent with oral care (w/ Caregiver Supervision) Follow Up Recommendations: Acute inpatient rehab (3hours/day) (for Cognitive-linguistic needs) Assistance recommended at discharge: Frequent or constant Supervision/Assistance (baseline Cognitive decline) SLP Visit Diagnosis: Dysphagia, oral phase (R13.11) (slight-mild) Plan: All goals met              Orinda Kenner, MS, [place] Speech Language Pathologist Rehab Services; [place] [phone] (ascom) Shamarr Faucett  01/30/2022, 2:36 PM

## 2022-01-30 NOTE — Progress Notes (Signed)
Physical Therapy Treatment Patient Details Name: [name] Gilbert MRN: [id number] DOB: 07-Mar-1961 Today's Date: 01/30/2022   History of Present Illness Patient is a 61 year old female with  a history of diabetes, hypertension as well as previous stroke who presents with difficulty speaking and increasing weakness.  Brain MRI reports confluent acute infarct in the medial left temporal lobe with  additional punctate infarct in the left thalamus, Associated edema  without significant mass effect, and remote infarcts. Of note, pt is now NWBing on RLE due to Right distal fibular oblique fracture status post fall and rapid response called 5/19 due to decreased RUE function, repeat CT head showed Evolving recent infarcts of the left temporal and occipital lobes and left thalamus with mild mass effect and no hemorrhage.    PT Comments    Pt alert, did speak more frequently to this author today, reported some pain in her R knee, especially with heel slides. The patient was able to perform a few BLE exercises throughout session, max multimodal cueing  for pt to attend to task and  maximize participation [facility] for all RLE movement). Supine to sit with modA, extended time, and bed rails. Fair sitting balance for several minutes at EOB. Lateral scoot to R with maxAx2. Pt able to anteriorly lean in prep for transfer, more difficulty pushing with LUE today. Pt did appear fatigued at end of session, but voiced that she was okay. The patient would benefit from further skilled PT intervention to continue to progress towards goals. Recommendation remains appropriate pending further progress with pt endurance/activity tolerance.       Recommendations for follow up therapy are one component of a multi-disciplinary discharge planning process, led by the attending physician.  Recommendations may be updated based on patient status, additional functional criteria and insurance authorization.  Follow Up Recommendations   Acute inpatient rehab (3hours/day)     Assistance Recommended at Discharge Intermittent Supervision/Assistance  Patient can return home with the following Assist for transportation;Help with stairs or ramp for entrance;Two people to help with walking and/or transfers;Two people to help with bathing/dressing/bathroom   Equipment Recommendations  Other (comment) (TBD at next venue of care)    Recommendations for Other Services       Precautions / Restrictions Precautions Precautions: Fall Restrictions Weight Bearing Restrictions: Yes RLE Weight Bearing: Non weight bearing     Mobility  Bed Mobility Overal bed mobility: Needs Assistance Bed Mobility: Supine to Sit     Supine to sit: Mod assist, HOB elevated     General bed mobility comments: extra time, reliant on bed rails and trunk elevation by PT as well as assistance for RLE movement    Transfers Overall transfer level: Needs assistance   Transfers: Bed to chair/wheelchair/BSC Sit to Stand: Max assist, +2 safety/equipment, +2 physical assistance          Lateral/Scoot Transfers: Max assist, +2 physical assistance General transfer comment: able to anteriorly lean in preparation for transfer, able to push with LUE minimally    Ambulation/Gait                   Stairs             Wheelchair Mobility    Modified Rankin (Stroke Patients Only)       Balance Overall balance assessment: Needs assistance Sitting-balance support: No upper extremity supported, Feet supported Sitting balance-Leahy Scale: Fair Sitting balance - Comments: maintains midline   Standing balance support: During functional activity  Cognition Arousal/Alertness: Awake/alert, Lethargic Behavior During Therapy: Flat affect Overall Cognitive Status: Impaired/Different from baseline Area of Impairment: Attention, Memory, Following commands, Safety/judgement, Awareness, Problem  solving                   Current Attention Level: Focused Memory: Decreased recall of precautions, Decreased short-term memory Following Commands: Follows one step commands inconsistently Safety/Judgement: Decreased awareness of safety, Decreased awareness of deficits Awareness: Intellectual Problem Solving: Slow processing, Decreased initiation, Difficulty sequencing, Requires verbal cues, Requires tactile cues General Comments: pt did speak a few more words to PT today        Exercises Other Exercises Other Exercises: supine AAROM heel slides bilaterally x10, LAQ R and L x5, AAROM for RLE and constant cueing for pt to attend to task. also able to anteriorly lean 3 times in chair at PT request, for repositioning    General Comments        Pertinent Vitals/Pain Pain Assessment Pain Assessment: Faces Faces Pain Scale: Hurts a little bit Pain Location: with R knee flexion Pain Descriptors / Indicators: Grimacing Pain Intervention(s): Limited activity within patient's tolerance, Repositioned    Home Living                          Prior Function            PT Goals (current goals can now be found in the care plan section) Progress towards PT goals: Progressing toward goals    Frequency    7X/week      PT Plan Current plan remains appropriate    Co-evaluation              AM-PAC PT "6 Clicks" Mobility   Outcome Measure  Help needed turning from your back to your side while in a flat bed without using bedrails?: A Lot Help needed moving from lying on your back to sitting on the side of a flat bed without using bedrails?: A Lot Help needed moving to and from a bed to a chair (including a wheelchair)?: A Lot Help needed standing up from a chair using your arms (e.g., wheelchair or bedside chair)?: A Lot Help needed to walk in hospital room?: Total Help needed climbing 3-5 steps with a railing? : Total 6 Click Score: 10    End of Session  Equipment Utilized During Treatment: Gait belt Activity Tolerance: Patient tolerated treatment well;Patient limited by fatigue Patient left: with call bell/phone within reach;with chair alarm set;in chair;Other (comment) (with OT at bedside) Nurse Communication: Mobility status PT Visit Diagnosis: Other abnormalities of gait and mobility (R26.89);Muscle weakness (generalized) (M62.81);Pain Pain - Right/Left: Right Pain - part of body: Ankle and joints of foot     Time: 2585-2778 PT Time Calculation (min) (ACUTE ONLY): 23 min  Charges:  $Therapeutic Exercise: 8-22 mins $Therapeutic Activity: 8-22 mins                     Olga Coaster PT, DPT 12:49 PM,01/30/22

## 2022-01-31 DIAGNOSIS — N1832 Chronic kidney disease, stage 3b: Secondary | ICD-10-CM | POA: Diagnosis not present

## 2022-01-31 DIAGNOSIS — I639 Cerebral infarction, unspecified: Secondary | ICD-10-CM | POA: Diagnosis not present

## 2022-01-31 DIAGNOSIS — E785 Hyperlipidemia, unspecified: Secondary | ICD-10-CM

## 2022-01-31 DIAGNOSIS — S82831A Other fracture of upper and lower end of right fibula, initial encounter for closed fracture: Secondary | ICD-10-CM | POA: Diagnosis not present

## 2022-01-31 LAB — BASIC METABOLIC PANEL
Anion gap: 7 (ref 5–15)
BUN: 41 mg/dL — ABNORMAL HIGH (ref 8–23)
CO2: 23 mmol/L (ref 22–32)
Calcium: 7.9 mg/dL — ABNORMAL LOW (ref 8.9–10.3)
Chloride: 103 mmol/L (ref 98–111)
Creatinine, Ser: 2.28 mg/dL — ABNORMAL HIGH (ref 0.44–1.00)
GFR, Estimated: 24 mL/min — ABNORMAL LOW (ref >60)
Glucose, Bld: 186 mg/dL — ABNORMAL HIGH (ref 70–99)
Potassium: 4.7 mmol/L (ref 3.5–5.1)
Sodium: 133 mmol/L — ABNORMAL LOW (ref 135–145)

## 2022-01-31 LAB — GLUCOSE, CAPILLARY
Glucose-Capillary: 90 mg/dL (ref 70–99)
Glucose-Capillary: 192 mg/dL — ABNORMAL HIGH (ref 70–99)
Glucose-Capillary: 236 mg/dL — ABNORMAL HIGH (ref 70–99)
Glucose-Capillary: 179 mg/dL — ABNORMAL HIGH (ref 70–99)

## 2022-01-31 MED ORDER — INSULIN GLARGINE-YFGN 100 UNIT/ML ~~LOC~~ SOLN
18.0000 [IU] | Freq: Every day | SUBCUTANEOUS | Status: DC
  Administered 2022-01-31 – 2022-02-02 (×3): 18 [IU] via SUBCUTANEOUS
  Filled 2022-01-31 (×3): qty 0.18

## 2022-01-31 MED ORDER — SODIUM CHLORIDE 0.9 % IV SOLN
INTRAVENOUS | Status: AC
Start: 2022-01-31 — End: 2022-02-01

## 2022-01-31 NOTE — Progress Notes (Signed)
Physical Therapy Treatment Patient Details Name: [name] Gilbert MRN: [id number] DOB: 01/01/1961 Today's Date: 01/31/2022   History of Present Illness Patient is a 61 year old female with  a history of diabetes, hypertension as well as previous stroke who presents with difficulty speaking and increasing weakness.  Brain MRI reports confluent acute infarct in the medial left temporal lobe with  additional punctate infarct in the left thalamus, Associated edema  without significant mass effect, and remote infarcts. Of note, pt is now NWBing on RLE due to Right distal fibular oblique fracture status post fall and rapid response called 5/19 due to decreased RUE function, repeat CT head showed Evolving recent infarcts of the left temporal and occipital lobes and left thalamus with mild mass effect and no hemorrhage.    PT Comments    Patient was alert and cooperative with PT session thi morning. She is making progress with functional independence and required only on person assistance for bed mobility and later scoot transfer. She declined getting up to the chair this morning due to fatigue, however she did participate with 4 incremental lateral scoot transfers to left with one person assistance. Facilitation for anterior weight shifting, emphasis on NWB of RLE and positioning of left foot/arm. Recommend to continue PT to maximize independence and facilitate return to prior level of function.    Recommendations for follow up therapy are one component of a multi-disciplinary discharge planning process, led by the attending physician.  Recommendations may be updated based on patient status, additional functional criteria and insurance authorization.  Follow Up Recommendations  Acute inpatient rehab (3hours/day)     Assistance Recommended at Discharge Intermittent Supervision/Assistance  Patient can return home with the following Assist for transportation;Help with stairs or ramp for entrance;Two people  to help with walking and/or transfers;Two people to help with bathing/dressing/bathroom   Equipment Recommendations   (to be determined at next level of care)    Recommendations for Other Services       Precautions / Restrictions Precautions Precautions: Fall Restrictions Weight Bearing Restrictions: Yes RLE Weight Bearing: Non weight bearing     Mobility  Bed Mobility Overal bed mobility: Needs Assistance Bed Mobility: Sit to Supine     Supine to sit: Max assist Sit to supine: Max assist   General bed mobility comments: assistance for BLE and trunk support. verbal cues for sequencing and technique. increased time and effort required    Transfers Overall transfer level: Needs assistance                Lateral/Scoot Transfers: Max assist General transfer comment: incremental scoot transfer performed to the left in preparation for OOB activity. faciliation for anterior weight shifting, use of LUE, LLE positioning, and cues to maintain NWB of RLE with transfers. 4 bouts of scooting performed. patient declined getting up in the chair at this time due to fatigue, although activity tolerance and independence with functional activity appear improved from last session    Ambulation/Gait                   Stairs             Wheelchair Mobility    Modified Rankin (Stroke Patients Only)       Balance Overall balance assessment: Needs assistance Sitting-balance support: Feet supported, No upper extremity supported Sitting balance-Leahy Scale: Fair Sitting balance - Comments: patient able to maintain midline without physical assistance. supervision to stand by assistance provided for safety  Cognition Arousal/Alertness: Awake/alert Behavior During Therapy: Flat affect Overall Cognitive Status: Impaired/Different from baseline                                 General Comments: patient is  able to follow single step commands consistently with extra time.        Exercises      General Comments General comments (skin integrity, edema, etc.): RLE was repositioned in bed for comfort, elevated on pillow and positioned in  more neutral position as patient with tendency to have right hip externally rotated at rest. also encourgaed patient to have RUE propped on pillow for optimal positioning and support of RUE for edema management and joint integrity. briefly spoke with daughter via phone and updated her on therapy progress.      Pertinent Vitals/Pain Pain Assessment Pain Assessment: No/denies pain    Home Living                          Prior Function            PT Goals (current goals can now be found in the care plan section) Acute Rehab PT Goals Patient Stated Goal: to feel better PT Goal Formulation: With patient Time For Goal Achievement: 02/09/22 Potential to Achieve Goals: Good Progress towards PT goals: Progressing toward goals    Frequency    7X/week      PT Plan Current plan remains appropriate    Co-evaluation              AM-PAC PT "6 Clicks" Mobility   Outcome Measure  Help needed turning from your back to your side while in a flat bed without using bedrails?: A Lot Help needed moving from lying on your back to sitting on the side of a flat bed without using bedrails?: A Lot Help needed moving to and from a bed to a chair (including a wheelchair)?: A Lot Help needed standing up from a chair using your arms (e.g., wheelchair or bedside chair)?: A Lot Help needed to walk in hospital room?: Total Help needed climbing 3-5 steps with a railing? : Total 6 Click Score: 10    End of Session Equipment Utilized During Treatment: Gait belt Activity Tolerance: Patient tolerated treatment well Patient left: in bed;with call bell/phone within reach;with nursing/sitter in room (nurse aide in the room assisting patient with  breakfast) Nurse Communication:  (discussed with nurse aide) PT Visit Diagnosis: Other abnormalities of gait and mobility (R26.89);Muscle weakness (generalized) (M62.81);Pain     Time: [id number] PT Time Calculation (min) (ACUTE ONLY): 21 min  Charges:  $Therapeutic Activity: 8-22 mins                     Minna Merritts, PT, MPT    Percell Locus 01/31/2022, 10:29 AM

## 2022-01-31 NOTE — TOC Progression Note (Signed)
Transition of Care [facility]) - Progression Note    Patient Details  Name: [name] Gilbert MRN: [id number] Date of Birth: May 25, 1961  Transition of Care [facility]) CM/SW [place], RN Phone Number: 01/31/2022, 11:05 AM  Clinical Narrative:   As per [facility] inpatient rehabilitation, they are currently waiting for insurance authorization for patient to transfer          Expected Discharge Plan and Services                                                 Social Determinants of Health (SDOH) Interventions    Readmission Risk Interventions     View : No data to display.

## 2022-01-31 NOTE — Plan of Care (Signed)
?  Problem: Education: ?Goal: Knowledge of disease or condition will improve ?Outcome: Progressing ?Goal: Knowledge of secondary prevention will improve (SELECT ALL) ?Outcome: Progressing ?Goal: Knowledge of patient specific risk factors will improve (INDIVIDUALIZE FOR PATIENT) ?Outcome: Progressing ?  ?

## 2022-01-31 NOTE — Progress Notes (Signed)
Inpatient Rehab Admissions Coordinator:  Continue to await insurance authorization. Spoke with daughter Sid Falcon to update her. Also reviewed insurance benefits/cost of CIR. Will continue to follow.   Wolfgang Phoenix, MS, CCC-SLP Admissions Coordinator [phone]

## 2022-01-31 NOTE — Progress Notes (Signed)
[facility] [place] NAME: [name] Gilbert    MR#:  [id number]  DATE OF BIRTH:  08/27/1961  SUBJECTIVE:  Continues with Right UE being weak. Pt encouraged to work with PT Good UOP No family at bedside today VITALS:  Blood pressure 127/70, pulse 68, temperature 98.1 F (36.7 C), resp. rate 17, height 5\' 2"  (1.575 m), weight 74.2 kg, SpO2 98 %.  PHYSICAL EXAMINATION:   GENERAL:  61 y.o.-year-old patient lying in the bed with no acute distress. Deconditioned LUNGS: Normal breath sounds bilaterally, no wheezing, rales, rhonchi.  CARDIOVASCULAR: S1, S2 normal. No murmurs, rubs, or gallops.  ABDOMEN: Soft, nontender, nondistended. Bowel sounds present.  EXTREMITIES: right leg splint+ NEUROLOGIC: nonfocal  patient is alert and awake right sided hemiparesis with more prominent right upper extremity weakness  LABORATORY PANEL:  CBC Recent Labs  Lab 01/25/22 0611  WBC 7.5  HGB 12.2  HCT 36.9  PLT 244     Chemistries  Recent Labs  Lab 01/25/22 0611 01/30/22 1508 01/31/22 1119  NA 132*   < > 133*  K 4.2   < > 4.7  CL 96*   < > 103  CO2 27   < > 23  GLUCOSE 261*   < > 186*  BUN 27*   < > 41*  CREATININE 1.71*   < > 2.28*  CALCIUM 8.5*   < > 7.9*  AST 27  --   --   ALT 14  --   --   ALKPHOS 99  --   --   BILITOT 0.9  --   --    < > = values in this interval not displayed.    Cardiac Enzymes No results for input(s): TROPONINI in the last 168 hours. RADIOLOGY:  No results found.  Assessment and Plan  Nyomii Sowada is a 61 y.o. 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  Pt states that she had history of 3 strokes in the past.  She has chronic left-sided weakness.  She states that she has been feeling weaker in the past 2 days.  She feels like her  left-sided weakness has worsened. She fell in the early morning. No LOC per her husband.  CT-head: 1. Relatively large acute to subacute infarct of the posterior and mesial Left temporal lobe, new since February. 2. No hemorrhagic transformation or significant mass effect. 3. Otherwise stable advanced underlying chronic ischemic disease. 4. No acute traumatic injury identified  MRI:  1. Confluent acute infarct in the medial left temporal lobe with additional punctate infarct in the left thalamus. Associated edema without significant mass effect. 2. Remote infarcts and moderate chronic microvascular disease.  MRA Head:  1. Left P1 PCA occlusion, previously severely stenotic. 2. Similar severe distal M1/proximal M2 MCA stenosis bilaterally. Similar mild to moderate right A1 ACA stenosis.  MRA Neck:  No evidence of significant (greater than 50%) stenosis  Acute CVA recurrent, left temporal lobe -- Patient states that she had 3 strokes in the past.  Patient has chronic left-sided weakness, which has worsened.   --CT head showed relatively large acute to subacute infarct of the posterior and mesial Left temporal lobe, which is new since February. -- Seen by physical therapy recommends rehab -- seen by neurology recommends Aspirin  81mg  and plavix 75mg  daily  for 90 days. Patient  got loading dose of Plavix --CT head today due to right UE increasing weakness --5/19--addendum repeat CT head showed Evolving recent infarcts of the left temporal and occipital lobes and left thalamus with mild mass effect and no hemorrhage.   Right distal fibular oblique fracture status post fall -- PRN pain meds -- orthopedic consultation with Dr. Poggi--s/p splint. Right LE NWB  Hypertension --  home meds-- amlodipine, hydralazine, Coreg isinopril and hydrochlorothiazide  --holding BP meds for now to allow permissive HTN  type II diabetes, uncontrolled with hyperlipidemia -- resume insulin and sliding  scale -- continue statins -- patient is apparently on regular diet since she does not eat hospital food. She is very picky about her food according to her family. For her to get nutrition I have allowed regular food. 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  >50% time spent on counselling and coordination of care  Note: This dictation was prepared with Dragon dictation along with smaller phrase technology. Any transcriptional errors that result from this process are unintentional.  [name] M.D    [facility]   CC: Primary care physician; Christella Scheuermann, MD

## 2022-01-31 NOTE — Plan of Care (Signed)
  Problem: Clinical Measurements: Goal: Respiratory complications will improve Outcome: Progressing Goal: Cardiovascular complication will be avoided Outcome: Progressing   Problem: Elimination: Goal: Will not experience complications related to urinary retention Outcome: Progressing   Problem: Pain Managment: Goal: General experience of comfort will improve Outcome: Progressing   Problem: Safety: Goal: Ability to remain free from injury will improve Outcome: Progressing   Problem: Skin Integrity: Goal: Risk for impaired skin integrity will decrease Outcome: Progressing   

## 2022-01-31 NOTE — Progress Notes (Signed)
Inpatient Diabetes Program Recommendations  AACE/ADA: New Consensus Statement on Inpatient Glycemic Control   Target Ranges:  Prepandial:   less than 140 mg/dL      Peak postprandial:   less than 180 mg/dL (1-2 hours)      Critically ill patients:  140 - 180 mg/dL    Latest Reference Range & Units 01/31/22 07:36  Glucose-Capillary 70 - 99 mg/dL 90    Latest Reference Range & Units 01/30/22 08:12 01/30/22 11:52 01/30/22 15:41 01/30/22 19:35  Glucose-Capillary 70 - 99 mg/dL 250 (H) 266 (H) 306 (H) 165 (H)   Review of Glycemic Control  Diabetes history: DM2 Outpatient Diabetes medications: Tresiba 30 units daily, Humalog 2 units TID with meals Current orders for Inpatient glycemic control: Semglee 30 units daily, Novolog 0-9 units TID with meals, Novolog 0-5 units QHS   Inpatient Diabetes Program Recommendations:     Insulin: Patient received Semglee 15 units on 01/30/22. Noted Semglee increased to 30 units daily and patient had previously went low on Semglee 30 units daily.  Please consider decreasing Semglee to 15 units daily and adding Novolog 4 units TID with meals for meal coverage if patient eats at least 50% of meals.   Thanks, Barnie Alderman, RN, MSN, CDE Diabetes Coordinator Inpatient Diabetes Program [phone] (Team Pager from 8am to 5pm)

## 2022-01-31 NOTE — Care Management Important Message (Signed)
Important Message  Patient Details  Name: [name] Gilbert MRN: [id number] Date of Birth: [birth year]   Medicare Important Message Given:  Yes     [name] 01/31/2022, 12:59 PM

## 2022-01-31 NOTE — Progress Notes (Signed)
Occupational Therapy Treatment Patient Details Name: [name] Gilbert MRN: [id number] DOB: [birth year] Today's Date: 01/31/2022   History of present illness Patient is a 61 year old female with  a history of diabetes, hypertension as well as previous stroke who presents with difficulty speaking and increasing weakness.  Brain MRI reports confluent acute infarct in the medial left temporal lobe with  additional punctate infarct in the left thalamus, Associated edema  without significant mass effect, and remote infarcts. Of note, pt is now NWBing on RLE due to Right distal fibular oblique fracture status post fall and rapid response called 5/19 due to decreased RUE function, repeat CT head showed Evolving recent infarcts of the left temporal and occipital lobes and left thalamus with mild mass effect and no hemorrhage.   OT comments  Ms Haapala was seen for OT treatment on this date. Upon arrival to room pt reclined in bed - R wrist IV site noted to be infiltrated, RN called and removed during session. Daughter in at start of session with lunch as pt has decreased appetite for hospital food. Pt eager to eat and demonstrates increased participation/communication this date.   Pt requires MOD A exit L side of bed. MIN A self-feeding seated EOB - cues for aspiration pcns and multimodal cues to use RUE as gross stabilizer. Initiated OOB t/f however pt demonstrated increased difficulty following multistep commands to maintain RLE NWBing and sequence t/f  - ultimately required +2 assist for safe lateral scoots. MAX A sit>sup. MIN A for R rolling, MOD A for L rolling to complete bed level periaccess. MIN A don/doff gown at bed level - assist to thread over R shoulder. Pt making good progress toward goals. Will continue to follow POC. Discharge recommendation remains appropriate.     Recommendations for follow up therapy are one component of a multi-disciplinary discharge planning process, led by the attending  physician.  Recommendations may be updated based on patient status, additional functional criteria and insurance authorization.    Follow Up Recommendations  Acute inpatient rehab (3hours/day)    Assistance Recommended at Discharge Frequent or constant Supervision/Assistance  Patient can return home with the following  Help with stairs or ramp for entrance;Two people to help with walking and/or transfers;Two people to help with bathing/dressing/bathroom   Equipment Recommendations  Hospital bed    Recommendations for Other Services      Precautions / Restrictions Precautions Precautions: Fall Restrictions Weight Bearing Restrictions: Yes RLE Weight Bearing: Non weight bearing       Mobility Bed Mobility Overal bed mobility: Needs Assistance Bed Mobility: Rolling, Supine to Sit, Sit to Supine Rolling: Min assist, Mod assist   Supine to sit: Mod assist Sit to supine: Max assist        Transfers Overall transfer level: Needs assistance   Transfers: Bed to chair/wheelchair/BSC            Lateral/Scoot Transfers: Max assist, +2 physical assistance, Total assist General transfer comment: MAX initial scoot decreasing to TOTAL x2 for safe completion     Balance Overall balance assessment: Needs assistance Sitting-balance support: Feet supported, No upper extremity supported Sitting balance-Leahy Scale: Fair                                     ADL either performed or assessed with clinical judgement   ADL Overall ADL's : Needs assistance/impaired  General ADL Comments: MIN A self-feeding seated EOB - cues for aspiration pcns and multimodal cues to use RUE as gross stabilizer. Initiated OOB t/f however pt demonstrated increased difficulty following multistep commands to maintain RLE NWBing and sequence t/f  - ultimately required +2 assist for safe lateral scoots with TOTAL LOB. MIN A for R rolling,  MOD A for L rolling to complete bed levle pericare. MIN A don/doff gown at bed level - assist to thread over R shoulder      Cognition Arousal/Alertness: Awake/alert Behavior During Therapy: Flat affect Overall Cognitive Status: Impaired/Different from baseline Area of Impairment: Safety/judgement, Following commands, Memory                     Memory: Decreased recall of precautions, Decreased short-term memory Following Commands: Follows one step commands inconsistently Safety/Judgement: Decreased awareness of safety, Decreased awareness of deficits     General Comments: difficulty sequencing transfer, requires multimodal cues for safety.              General Comments R wrist IV site noted to be infiltrated, RN called and removed during session    Pertinent Vitals/ Pain       Pain Assessment Pain Assessment: Faces Faces Pain Scale: Hurts little more Pain Location: R lower leg / knee Pain Descriptors / Indicators: Grimacing Pain Intervention(s): Limited activity within patient's tolerance, Repositioned   Frequency  Min 4X/week        Progress Toward Goals  OT Goals(current goals can now be found in the care plan section)  Progress towards OT goals: Progressing toward goals  Acute Rehab OT Goals Patient Stated Goal: to go home OT Goal Formulation: With patient Time For Goal Achievement: 02/11/22 Potential to Achieve Goals: Good ADL Goals Pt Will Perform Grooming: with min assist;standing;sitting Pt Will Perform Lower Body Dressing: with min assist;sit to/from stand Pt Will Transfer to Toilet: with min assist;stand pivot transfer;bedside commode  Plan Discharge plan remains appropriate;Frequency remains appropriate    Co-evaluation                 AM-PAC OT "6 Clicks" Daily Activity     Outcome Measure   Help from another person eating meals?: A Little Help from another person taking care of personal grooming?: A Lot Help from another  person toileting, which includes using toliet, bedpan, or urinal?: A Lot Help from another person bathing (including washing, rinsing, drying)?: A Lot Help from another person to put on and taking off regular upper body clothing?: A Little Help from another person to put on and taking off regular lower body clothing?: A Lot 6 Click Score: 14    End of Session    OT Visit Diagnosis: Muscle weakness (generalized) (M62.81);Other symptoms and signs involving cognitive function   Activity Tolerance Patient tolerated treatment well   Patient Left in bed;with call bell/phone within reach;with bed alarm set   Nurse Communication          Time: 5784-6962 OT Time Calculation (min): 33 min  Charges: OT General Charges $OT Visit: 1 Visit OT Treatments $Self Care/Home Management : 23-37 mins  Kathie Dike, M.S. OTR/L  01/31/22, 2:44 PM  ascom [phone]

## 2022-01-31 NOTE — Progress Notes (Signed)
Ultrasound for over 30" to both arms, posterior, anterior, upper and lower without any visualization for possibility of any IV access. Nothing adequate for Midline placement either. Dr. Allena Katz  & patients RN aware. Patient did tell me she has had IV's placed in her neck before.

## 2022-02-01 DIAGNOSIS — Z7189 Other specified counseling: Secondary | ICD-10-CM | POA: Diagnosis not present

## 2022-02-01 DIAGNOSIS — I5032 Chronic diastolic (congestive) heart failure: Secondary | ICD-10-CM | POA: Diagnosis not present

## 2022-02-01 DIAGNOSIS — S82831A Other fracture of upper and lower end of right fibula, initial encounter for closed fracture: Secondary | ICD-10-CM | POA: Diagnosis not present

## 2022-02-01 DIAGNOSIS — E875 Hyperkalemia: Secondary | ICD-10-CM

## 2022-02-01 DIAGNOSIS — E785 Hyperlipidemia, unspecified: Secondary | ICD-10-CM | POA: Diagnosis not present

## 2022-02-01 DIAGNOSIS — I639 Cerebral infarction, unspecified: Secondary | ICD-10-CM | POA: Diagnosis not present

## 2022-02-01 LAB — BASIC METABOLIC PANEL
Anion gap: 8 (ref 5–15)
Anion gap: 6 (ref 5–15)
BUN: 34 mg/dL — ABNORMAL HIGH (ref 8–23)
BUN: 34 mg/dL — ABNORMAL HIGH (ref 8–23)
CO2: 23 mmol/L (ref 22–32)
CO2: 26 mmol/L (ref 22–32)
Calcium: 8.2 mg/dL — ABNORMAL LOW (ref 8.9–10.3)
Calcium: 8.5 mg/dL — ABNORMAL LOW (ref 8.9–10.3)
Chloride: 102 mmol/L (ref 98–111)
Chloride: 103 mmol/L (ref 98–111)
Creatinine, Ser: 2.1 mg/dL — ABNORMAL HIGH (ref 0.44–1.00)
Creatinine, Ser: 2.04 mg/dL — ABNORMAL HIGH (ref 0.44–1.00)
GFR, Estimated: 26 mL/min — ABNORMAL LOW (ref >60)
GFR, Estimated: 27 mL/min — ABNORMAL LOW (ref >60)
Glucose, Bld: 244 mg/dL — ABNORMAL HIGH (ref 70–99)
Glucose, Bld: 96 mg/dL (ref 70–99)
Potassium: 5.7 mmol/L — ABNORMAL HIGH (ref 3.5–5.1)
Potassium: 4.2 mmol/L (ref 3.5–5.1)
Sodium: 133 mmol/L — ABNORMAL LOW (ref 135–145)
Sodium: 135 mmol/L (ref 135–145)

## 2022-02-01 LAB — GLUCOSE, CAPILLARY
Glucose-Capillary: 88 mg/dL (ref 70–99)
Glucose-Capillary: 246 mg/dL — ABNORMAL HIGH (ref 70–99)
Glucose-Capillary: 162 mg/dL — ABNORMAL HIGH (ref 70–99)
Glucose-Capillary: 119 mg/dL — ABNORMAL HIGH (ref 70–99)

## 2022-02-01 MED ORDER — CALCIUM GLUCONATE-NACL 1-0.675 GM/50ML-% IV SOLN
1.0000 g | Freq: Once | INTRAVENOUS | Status: DC
  Filled 2022-02-01: qty 50

## 2022-02-01 MED ORDER — INSULIN ASPART 100 UNIT/ML IJ SOLN
5.0000 [IU] | Freq: Once | INTRAMUSCULAR | Status: AC
Start: 2022-02-01 — End: 2022-02-01
  Administered 2022-02-01: 5 [IU] via SUBCUTANEOUS
  Filled 2022-02-01: qty 1

## 2022-02-01 MED ORDER — DEXTROSE 50 % IV SOLN
1.0000 | Freq: Once | INTRAVENOUS | Status: DC
  Filled 2022-02-01: qty 50

## 2022-02-01 MED ORDER — HYDROCHLOROTHIAZIDE 25 MG PO TABS
25.0000 mg | ORAL_TABLET | Freq: Every day | ORAL | Status: DC
Start: 2022-02-01 — End: 2022-02-02
  Administered 2022-02-01 – 2022-02-02 (×2): 25 mg via ORAL
  Filled 2022-02-01 (×2): qty 1

## 2022-02-01 NOTE — Assessment & Plan Note (Signed)
Pending rehab placement

## 2022-02-01 NOTE — Progress Notes (Addendum)
Daily Progress Note   Patient Name: [name] Gilbert       Date: 02/01/2022 DOB: 06-13-1961  Age: 61 y.o. MRN#: [id number] Attending Physician: Emeterio Reeve, DO Primary Care Physician: Christella Scheuermann, MD Admit Date: 01/25/2022  Reason for Consultation/Follow-up: Establishing goals of care  Subjective: Notes, labs, diagnostics reviewed. In to bedside. Husband is present. Patient is slow in answering questions and only speaks to directly answer questions asked of her. They discuss her previous strokes and state she has had 5. They discuss her hyperglycemia and that they are unable to control sugar. She states sometimes she eats things she should not. Daughter arrived to bedside.   We discussed her diagnoses, prognosis, GOC, EOL wishes disposition and options.  Created space and opportunity for patient  to explore thoughts and feelings regarding current medical information.   A detailed discussion was had today regarding advanced directives.  Concepts specific to code status, artifical feeding and hydration, IV antibiotics and rehospitalization were discussed.  The difference between an aggressive medical intervention path and a comfort care path was discussed.  Values and goals of care important to patient and family were attempted to be elicited.  Discussed limitations of medical interventions to prolong quality of life in some situations and discussed the concept of human mortality.  Daughter discusses that her mother eats one large meal per day between 4:00 and 6:00pm. She states at noon, her glucose can sometimes be around 500, and after patient eats her meal, around 8 or 9:00  pm, the glucometer simply says it is unreadable. She states following days like this, sometimes the next  morning the glucose can be down to the 20's and 30's from unreadable the night before. She states in any event, the glucose is usually very high. Discussed current A1C of 11. She states she is willing to make changes to her diet to help. They tell me she loves fried foods, and she states she will cut back on this to help cholesterol.   We began discussing [place]. Patient tells me she would like to go to rehab, and would want to return to the hospital if needed to treat the treatable. Patient states she would not ever want life support. She tells her family and I she would never want dialysis, and would never want to be placed  on a ventilator, or have CPR, or shocks. Daughter  discusses that she has previously been placed on a ventilator at [facility]. She tells me this would be a change, and tells [place] she would like to honor her mother's wishes whatever they may be, and is concerned for her mother's QOL. Patient's husband began having symptoms of his own such that the meeting was abruptly ended as we had to call to have him taken down to the ED for evaluation. Returned to answer questions after husband was taken to ED; patient had no questions including regarding her husband.    Length of Stay: 7  Current Medications: Scheduled Meds:   aspirin  81 mg Oral q morning   clopidogrel  75 mg Oral Daily   enoxaparin (LOVENOX) injection  30 mg Subcutaneous Q24H   insulin aspart  0-5 Units Subcutaneous QHS   insulin aspart  0-9 Units Subcutaneous TID WC   insulin glargine-yfgn  18 Units Subcutaneous Daily   levETIRAcetam  250 mg Oral BID   pregabalin  75 mg Oral BID   rosuvastatin  40 mg Oral QHS    Continuous Infusions:   PRN Meds: acetaminophen **OR** acetaminophen (TYLENOL) oral liquid 160 mg/5 mL **OR** acetaminophen, HYDROcodone-acetaminophen, ondansetron (ZOFRAN) IV, senna-docusate  Physical Exam Pulmonary:     Effort: Pulmonary effort is normal.  Neurological:     Mental Status: She is alert.             Vital Signs: BP (!) 161/84 (BP Location: Right Arm)   Pulse 72   Temp 98 F (36.7 C)   Resp 18   Ht 5\' 2"  (1.575 m)   Wt 74.2 kg   SpO2 100%   BMI 29.92 kg/m  SpO2: SpO2: 100 % O2 Device: O2 Device: Room Air O2 Flow Rate:    Intake/output summary:  Intake/Output Summary (Last 24 hours) at 02/01/2022 1052 Last data filed at 01/31/2022 1700 Gross per 24 hour  Intake 646.42 ml  Output 850 ml  Net -203.58 ml   LBM: Last BM Date : 01/25/22 Baseline Weight: Weight: 82 kg Most recent weight: Weight: 74.2 kg         Patient Active Problem List   Diagnosis Date Noted   Closed fracture of right distal fibula    Stroke ([place]) 01/25/2022   HLD (hyperlipidemia) 01/25/2022   Type II diabetes mellitus with renal manifestations ([place]) 01/25/2022   Stage 3b chronic kidney disease (CKD) (HCC) 01/25/2022   Chronic diastolic CHF (congestive heart failure) ([place]) 01/25/2022   Hypertensive emergency 01/25/2022   Elevated troponin 01/25/2022   Fall    Neurologic abnormality 06/13/2021   Hypoglycemia 06/13/2021   Hypothermia 06/13/2021   Dehydration 06/12/2021   Headache 06/12/2021   Abdominal pain 06/12/2021   Hypoglycemia associated with diabetes ([place]) 06/12/2021   Acute respiratory failure with hypoxia (HCC)    HHNC (hyperglycemic hyperosmolar nonketotic coma) ([place]) 10/01/2019   HTN (hypertension) 11/01/2018   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 She would want to return to the hospital as needed to treat the treatable. Patient's husband is NOK, but during [place] conversation, before he provided input in finalizing decisions, he was taken to the ED himself for evaluation.  RD consulted to  meet with  patient/ family regarding education on intake.   Code Status:    Code Status Orders  (From admission, onward)           Start     Ordered   01/25/22 0830  Full code  Continuous        01/25/22 0832           Code Status History     Date Active Date Inactive Code Status Order ID Comments User Context   06/12/2021 0815 06/15/2021 1754 Full Code [id number]  Neena Rhymes, MD ED   10/01/2019 2158 10/05/2019 2203 Full Code [id number]  Bradly Bienenstock, NP ED   11/01/2018 0054 11/01/2018 1555 Full Code [id number]  Lance Coon, MD ED   08/22/2016 0507 08/23/2016 1549 Full Code [id number]  Saundra Shelling, MD Inpatient   01/26/2016 1446 01/28/2016 1925 Full Code [id number]  [name], MD ED       Thank you for allowing the Palliative Medicine Team to assist in the care of this patient.   Asencion Gowda, NP  Please contact Palliative Medicine Team phone at [phone] for questions and concerns.

## 2022-02-01 NOTE — Assessment & Plan Note (Signed)
5.7 on labs 05/24 EKG ordered, pending as of this note, calcium ordered Insulin w/ novolog 5 units since unable to given IV D50 d/t loss of IV access, IV team has been asked to evaluate  If Glc ok and K still high, will given additional insulin / other  Uncertain why high vs lab error, will monitor, Cr as noted renal function is worse from baseline but not acutely decompensated Cont hold spironolactone  Encourage po hydration to maintain renal fxn

## 2022-02-01 NOTE — Plan of Care (Signed)
?  Problem: Education: ?Goal: Knowledge of disease or condition will improve ?Outcome: Progressing ?Goal: Knowledge of secondary prevention will improve (SELECT ALL) ?Outcome: Progressing ?Goal: Knowledge of patient specific risk factors will improve (INDIVIDUALIZE FOR PATIENT) ?Outcome: Progressing ?  ?

## 2022-02-01 NOTE — Progress Notes (Incomplete)
Inpatient Rehabilitation Admission Medication Review by a Pharmacist  A complete drug regimen review was completed for this patient to identify any potential clinically significant medication issues.  High Risk Drug Classes Is patient taking? Indication by Medication  Antipsychotic Yes Compazine- N/V  Anticoagulant Yes Lovenox- VTE prophylaxis  Antibiotic No   Opioid Yes Norco- acute pain  Antiplatelet Yes Aspirin, plavix- CVA prophylaxis  Hypoglycemics/insulin Yes Insulin- T2DM  Vasoactive Medication No   Chemotherapy No   Other Yes Keppra- seizure prophylaxis Lyrica- neuropathic pain Crestor- HLD      Type of Medication Issue Identified Description of Issue Recommendation(s)  Drug Interaction(s) (clinically significant)     Duplicate Therapy     Allergy     No Medication Administration End Date     Incorrect Dose     Additional Drug Therapy Needed     Significant med changes from prior encounter (inform family/care partners about these prior to discharge).    Other  PTA meds: Norvasc 10 mg daily Coreg 25 mg bid Hydralazine 50 mg daily Lisinopril 20 mg daily Aldactone 150 mg daily Per neurology- would restart PTA antihypertensive meds on 02/03/2022 one at a time with a few days between re-introductions. Recommends using PRN antihypertensive medications for BP >180/100    Clinically significant medication issues were identified that warrant physician communication and completion of prescribed/recommended actions by midnight of the next day:  No   Time spent performing this drug regimen review (minutes):  30   Hailley Byers BS, PharmD, BCPS Clinical Pharmacist 02/01/2022 12:38 PM  Contact: [phone] after 3 PM  "Be curious, not judgmental..." -Debbora Dus

## 2022-02-01 NOTE — Progress Notes (Signed)
OT Cancellation Note  Patient Details Name: [name] Gilbert MRN: [id number] DOB: 26-Sep-1960   Cancelled Treatment:    Reason Eval/Treat Not Completed: Medical issues which prohibited therapy. Pt with potassium of 5.7 which is contraindicated for therapeutic intervention. MD consulted via secure chat who confirmed therapy hold today. OT will continue to follow.   Jackquline Denmark, MS, OTR/L , CBIS ascom [phone]  02/01/22, 2:01 PM

## 2022-02-01 NOTE — Progress Notes (Signed)
PROGRESS NOTE    [name] Gilbert  [id number] DOB: 05/17/1961  DOA: 01/25/2022 Date of Service: 02/01/22 PCP: Christella Scheuermann, MD     Brief Narrative / Hospital Course:  [name] Gilbert is a 61 y.o. 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  Pt states that she had history of 3 strokes in the past.  She has chronic left-sided weakness.  She states that she has been feeling weaker in the 2 days prior to presentation to ED.  She felt like her left-sided weakness has worsened. She fell in the early morning prior to admission. No LOC per her husband.  CT head showed relatively large acute/subacute infarct posterior and medial left temporal lobe new since February, otherwise stable.  MRI showed confluent acute infarct in medial left temporal lobe with additional punctate infarct in left thalamus, associated edema, no mass effect, remote infarcts, chronic microvascular disease.  Neurology consulted.  Antihypertensives initially held, patient to continue on aspirin, Plavix, DAPT for 90 days.  Patient was also noted to have right distal fibular fracture status post fall, evaluated by orthopedics, splinted and nonweightbearing pending placement for rehab.  Noted to have acute on chronic kidney disease, baseline creatinine 1.4-1.7, creatinine over course of admission has been 2.2-2.4 with good urine output.  Hyperkalemia on labs 02/01/2022, treated.  Pending recheck as of submission of this progress note.  Consultants:  Neurology Palliative Care   Procedures: Orthopedics     Subjective: Patient reports no concerns today, denies pain. Family is at bedside today and no additional concerns or questions.      ASSESSMENT & PLAN:   Principal Problem:   Stroke [facility]) Active Problems:   Elevated troponin   HTN  (hypertension)   Hypertensive emergency   HLD (hyperlipidemia)   Type II diabetes mellitus with renal manifestations (HCC)   Stage 3b chronic kidney disease (CKD) (HCC)   Chronic diastolic CHF (congestive heart failure) ([place])   Closed fracture of right distal fibula   Stroke [facility]) Patient states that she had 3 strokes in the past.  Patient has chronic left-sided weakness, which has worsened.  CT head showed relatively large acute to subacute infarct of the posterior and mesial Left temporal lobe, which is new since February.  Patient has elevated blood pressure 204/98.  Initially Cardene drip was started in ED. Consulted Dr. Leonel Ramsay neurology, recommended permissive hypertension for SBP > 220 and dBP> 120. ASA and statin were continued, antihypertensives held but added back lower dose HCTZ 05/24, MRI/MRA and Echo as below. Continue Keppra. PT/OT following. Awaiting placement for rehab.   HTN (hypertension) HTN and hypertensive emergency: Blood pressure 204/98. Initially allowing for permissive hypertension per neurology's recommendation Held home amlodipine, coreg, HCTZ, lisinopril, spironolactone Will continue to hold spironolactone in light of hyperkalemia 05/24, will restart HCTZ given higher BP though this will probably not have short term effect on potassium, also started back at 25 mg instead of 50 mg given Cr/GFR.  As needed enalaprilate for SBP> 220 and dBP > 120   Type II diabetes mellitus with renal manifestations (HCC) Recent A1c 11.6, poorly controlled.  SSI and glargine   Stage 3b chronic kidney disease (CKD) (HCC) Recent baseline creatinine 1.4-1.7.  Cr has trended up to max 2.42* and today 05/24 was 2.10   Chronic diastolic CHF (congestive heart failure) ([place]) 2D echo on 06/13/2021 showed EF of 55-60% with  grade 1 diastolic dysfunction.  Patient does not have edema, no shortness of breath or JVD.  CHF seems to be compensated. Held spironolactone and HCTZ. Will continue  to hold spironolactone in light of hyperkalemia 05/24, will restart HCTZ given higher BP though this will probably not have short term effect on potassium, also started back at 25 mg instead of 50 mg given Cr/GFR.   Hypertensive emergency Resolved See assessment and plan under hypertension  Elevated troponin Troponin 62.  Denies chest pain or shortness of breath.  Likely demand ischemia  Closed fracture of right distal fibula Pending rehab placement   Hyperkalemia 5.7 on labs 05/24 EKG ordered, pending as of this note, calcium ordered Insulin w/ novolog 5 units since unable to given IV D50 d/t loss of IV access, IV team has been asked to evaluate  If Glc ok and K still high, will given additional insulin / other  Uncertain why high vs lab error, will monitor, Cr as noted renal function is worse from baseline but not acutely decompensated Cont hold spironolactone  Encourage po hydration to maintain renal fxn     DVT prophylaxis: lovenox Code Status: DNR per discussion today 02/01/2022 with palliative care, CODE STATUS orders were updated in epic 02/01/2022 Family Communication: family at bedside on rounds this morning  Disposition Plan / TOC needs: awaiting SNF placement / CIR approval  Barriers to discharge / significant pending items: new hyperkalemia on labs today, pending treatment and follow-up              Objective: Vitals:   01/31/22 1623 01/31/22 2043 02/01/22 0538 02/01/22 0843  BP: (!) 149/77 (!) 150/70 135/72 (!) 161/84  Pulse: 70 67 65 72  Resp: 17 16 12 18   Temp: 98.4 F (36.9 C) 97.8 F (36.6 C) 98.1 F (36.7 C) 98 F (36.7 C)  TempSrc:      SpO2: 100% 100% 100% 100%  Weight:      Height:        Intake/Output Summary (Last 24 hours) at 02/01/2022 1320 Last data filed at 01/31/2022 1700 Gross per 24 hour  Intake 646.42 ml  Output 450 ml  Net 196.42 ml   Filed Weights   01/25/22 0609 01/25/22 2100  Weight: 82 kg 74.2 kg    Examination:   Constitutional:  VS as above General Appearance: alert, NAD Eyes: Normal lids and conjunctive Ears, Nose, Mouth, Throat: Normal appearance MM Dry Neck: No masses, trachea midline Respiratory: Normal respiratory effort Breath sounds normal, no wheeze/rhonchi/rales Cardiovascular: S1/S2 normal No lower extremity edema Gastrointestinal: Nontender, no masses Neurological: No cranial nerve deficit on limited exam Psychiatric: Fair to normal judgment/insight - she is alert and follows commands, is minimally talkative but answers questions coherently  Flat mood and affect       Scheduled Medications:   aspirin  81 mg Oral q morning   clopidogrel  75 mg Oral Daily   enoxaparin (LOVENOX) injection  30 mg Subcutaneous Q24H   insulin aspart  0-5 Units Subcutaneous QHS   insulin aspart  0-9 Units Subcutaneous TID WC   insulin glargine-yfgn  18 Units Subcutaneous Daily   levETIRAcetam  250 mg Oral BID   pregabalin  75 mg Oral BID   rosuvastatin  40 mg Oral QHS    Continuous Infusions:   PRN Medications:  acetaminophen **OR** acetaminophen (TYLENOL) oral liquid 160 mg/5 mL **OR** acetaminophen, HYDROcodone-acetaminophen, ondansetron (ZOFRAN) IV, senna-docusate  Antimicrobials:  Anti-infectives (From admission, onward)    None  Data Reviewed: I have personally reviewed following labs and imaging studies  CBC: No results for input(s): WBC, NEUTROABS, HGB, HCT, MCV, PLT in the last 168 hours. Basic Metabolic Panel: Recent Labs  Lab 01/30/22 1508 01/31/22 1119 02/01/22 1227  NA 134* 133* 133*  K 4.8 4.7 5.7*  CL 102 103 102  CO2 25 23 23   GLUCOSE 312* 186* 244*  BUN 47* 41* 34*  CREATININE 2.42* 2.28* 2.10*  CALCIUM 8.1* 7.9* 8.2*   GFR: Estimated Creatinine Clearance: 26.2 mL/min (A) (by C-G formula based on SCr of 2.1 mg/dL (H)). Liver Function Tests: No results for input(s): AST, ALT, ALKPHOS, BILITOT, PROT, ALBUMIN in the last 168 hours. No  results for input(s): LIPASE, AMYLASE in the last 168 hours. No results for input(s): AMMONIA in the last 168 hours. Coagulation Profile: No results for input(s): INR, PROTIME in the last 168 hours. Cardiac Enzymes: No results for input(s): CKTOTAL, CKMB, CKMBINDEX, TROPONINI in the last 168 hours. BNP (last 3 results) No results for input(s): PROBNP in the last 8760 hours. HbA1C: No results for input(s): HGBA1C in the last 72 hours. CBG: Recent Labs  Lab 01/31/22 1150 01/31/22 1624 01/31/22 2231 02/01/22 0742 02/01/22 1218  GLUCAP 192* 236* 179* 88 246*   Lipid Profile: No results for input(s): CHOL, HDL, LDLCALC, TRIG, CHOLHDL, LDLDIRECT in the last 72 hours. Thyroid Function Tests: No results for input(s): TSH, T4TOTAL, FREET4, T3FREE, THYROIDAB in the last 72 hours. Anemia Panel: No results for input(s): VITAMINB12, FOLATE, FERRITIN, TIBC, IRON, RETICCTPCT in the last 72 hours. Urine analysis:    Component Value Date/Time   COLORURINE YELLOW (A) 01/25/2022 0614   APPEARANCEUR TURBID (A) 01/25/2022 0614   APPEARANCEUR Clear 12/06/2014 0133   LABSPEC 1.015 01/25/2022 0614   LABSPEC 1.011 12/06/2014 0133   PHURINE 6.0 01/25/2022 0614   GLUCOSEU 150 (A) 01/25/2022 0614   GLUCOSEU >=500 12/06/2014 0133   HGBUR SMALL (A) 01/25/2022 0614   BILIRUBINUR NEGATIVE 01/25/2022 0614   BILIRUBINUR Negative 12/06/2014 0133   KETONESUR NEGATIVE 01/25/2022 0614   PROTEINUR 100 (A) 01/25/2022 0614   NITRITE NEGATIVE 01/25/2022 0614   LEUKOCYTESUR NEGATIVE 01/25/2022 0614   LEUKOCYTESUR Negative 12/06/2014 0133   Sepsis Labs: @LABRCNTIP (procalcitonin:4,lacticidven:4)  Recent Results (from the past 240 hour(s))  Resp Panel by RT-PCR (Flu A&B, Covid) Nasopharyngeal Swab     Status: None   Collection Time: 01/25/22  6:11 AM   Specimen: Nasopharyngeal Swab; Nasopharyngeal(NP) swabs in vial transport medium  Result Value Ref Range Status   SARS Coronavirus 2 by RT PCR NEGATIVE  NEGATIVE Final    Comment: (NOTE) SARS-CoV-2 target nucleic acids are NOT DETECTED.  The SARS-CoV-2 RNA is generally detectable in upper respiratory specimens during the acute phase of infection. The lowest concentration of SARS-CoV-2 viral copies this assay can detect is 138 copies/mL. A negative result does not preclude SARS-Cov-2 infection and should not be used as the sole basis for treatment or other patient management decisions. A negative result may occur with  improper specimen collection/handling, submission of specimen other than nasopharyngeal swab, presence of viral mutation(s) within the areas targeted by this assay, and inadequate number of viral copies(<138 copies/mL). A negative result must be combined with clinical observations, patient history, and epidemiological information. The expected result is Negative.  Fact Sheet for Patients:  BloggerCourse.com  Fact Sheet for Healthcare Providers:  SeriousBroker.it  This test is no t yet approved or cleared by the [place] and  has been authorized for  detection and/or diagnosis of SARS-CoV-2 by FDA under an Emergency Use Authorization (EUA). This EUA will remain  in effect (meaning this test can be used) for the duration of the COVID-19 declaration under Section 564(b)(1) of the Act, 21 U.S.C.section 360bbb-3(b)(1), unless the authorization is terminated  or revoked sooner.       Influenza A by PCR NEGATIVE NEGATIVE Final   Influenza B by PCR NEGATIVE NEGATIVE Final    Comment: (NOTE) The Xpert Xpress SARS-CoV-2/FLU/RSV plus assay is intended as an aid in the diagnosis of influenza from Nasopharyngeal swab specimens and should not be used as a sole basis for treatment. Nasal washings and aspirates are unacceptable for Xpert Xpress SARS-CoV-2/FLU/RSV testing.  Fact Sheet for Patients: EntrepreneurPulse.com.au  Fact Sheet for Healthcare  Providers: IncredibleEmployment.be  This test is not yet approved or cleared by the [place] FDA and has been authorized for detection and/or diagnosis of SARS-CoV-2 by FDA under an Emergency Use Authorization (EUA). This EUA will remain in effect (meaning this test can be used) for the duration of the COVID-19 declaration under Section 564(b)(1) of the Act, 21 U.S.C. section 360bbb-3(b)(1), unless the authorization is terminated or revoked.  Performed at [facility], [address] [place], [place] [address]   Urine Culture     Status: Abnormal   Collection Time: 01/25/22  7:37 AM   Specimen: Urine, Suprapubic  Result Value Ref Range Status   Specimen Description   Final    URINE, SUPRAPUBIC Performed at [facility], [address] [place], [place] [address]    Special Requests   Final    NONE Performed at [facility], [address] [place], [place] [address]    Culture MULTIPLE SPECIES PRESENT, SUGGEST RECOLLECTION (A)  Final   Report Status 01/26/2022 FINAL  Final         Radiology Studies last 96 hours: No results found.          LOS: 7 days    Time spent: 50 min    Emeterio Reeve, DO [facility] 02/01/2022, 1:20 PM   Staff may message me via secure chat in [place]  but this may not receive immediate response,  please page for urgent matters!  If 7PM-7AM, please contact night-coverage [email or web address]  Dictation software was used to generate the above note. Typos may occur and escape review, as with typed/written notes. Please contact Dr Sheppard Coil directly for clarity if needed.

## 2022-02-01 NOTE — Progress Notes (Signed)
Discussed with patient at bedside re: hyperkalemia on labs, unable to secure IV access to appropriately treat without getting PICC, IV team came earlier and was unsuccessful despite multiple attempts to get IV access.  Patient would like to defer a line/another attempt at IV access, she seems to appreciate the potential issues with this particularly with regard to ability to treat any complications.  Repeat BMP shows potassium is normalized, hopefully last lab was just an error, repeat with a.m. labs.  Encouraged p.o. intake.  Additionally, I let patient know that we would not be able to administer medications in the event of an emergency without IV access, I tried to revisit the conversation that palliative had with her earlier, per their documentations he says she would not want CPR, but she states now that she would want that.  I let her know that given her multiple comorbidities, I am not sure that it would be helpful for her, she seems torn on this issue, will change CODE STATUS back to full code and will reach out to palliative tomorrow to follow-up on this more clearly, given that their conversation was cut short when the patient's husband had some sort of episode that required his medical attention earlier today.

## 2022-02-01 NOTE — Progress Notes (Signed)
Inpatient Rehab Admissions Coordinator:    I do not have insurance auth for CIR today, she will not admit today.   Megan Salon, MS, CCC-SLP Rehab Admissions Coordinator  [phone] (celll) [phone] (office)

## 2022-02-01 NOTE — H&P (Signed)
Physical Medicine and Rehabilitation Admission H&P    Chief Complaint  Patient presents with   Fall  : HPI: [name] Gilbert is a 61 year old right-handed female with history of hypertension, , CVA with left-sided residual weakness maintained on low-dose aspirin, diabetes mellitus, diastolic congestive heart failure, CKD stage III.  Per chart review patient lives with spouse.  1 level home 2 steps to entry.  Modified independent prior to admission.  Presented to [facility] 01/25/2022 with aphasia with increasing  weakness and recent fall with complaints of right ankle pain.  Noted blood pressure 204/98.  Cranial CT scan showed relatively large acute to subacute infarct in the posterior and mesial left temporal lobe new since February.  No hemorrhagic transformation or significant mass effect.  CT cervical spine negative.  MRI/MRA confluent acute infarct in the medial left temporal lobe with additional punctate infarct in the left thalamus.  Associated edema without significant mass effect.  Remote infarct and moderate chronic microvascular disease.  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  Similar severe distal M1/proximal M2 MCA stenosis bilaterally.  Patient did not receive tPA.  Admission chemistries unremarkable except sodium 132 glucose 261 BUN 27 creatinine 1.71, troponin 62-50-44, BNP 80.8, urinalysis negative nitrite, urine drug screen negative.  Echocardiogram with ejection fraction of 60 to 123456 grade 1 diastolic dysfunction.  Presently maintained on aspirin 81 mg daily and Plavix 75 mg daily for CVA prophylaxis for 90 days.  Lovenox added for DVT prophylaxis.  Maintained on Keppra for seizure prophylaxis.  X-rays of right ankle revealed a minimally displaced right distal fibular fracture with orthopedic services consulted Dr. Milagros Evener with no surgical intervention placed in a posterior splint with a sugar-tong supplement and nonweightbearing.  Tolerating mechanical soft diet.  Monitoring  of AKI on CKD latest creatinine 2.28.  Palliative care consulted to establish goals of care.  Therapy evaluations completed due to patient decreased functional mobility and speech difficulties was admitted for a comprehensive rehab program.  Review of Systems  Constitutional:  Negative for chills and fever.  HENT:  Negative for hearing loss.   Eyes:  Positive for blurred vision.  Respiratory:  Negative for cough and shortness of breath.   Cardiovascular:  Positive for leg swelling. Negative for chest pain and palpitations.  Gastrointestinal:  Positive for constipation. Negative for heartburn, nausea and vomiting.  Genitourinary:  Negative for dysuria, flank pain and hematuria.  Musculoskeletal:  Positive for falls and myalgias.  Skin:  Negative for rash.  Neurological:  Positive for speech change and weakness.  All other systems reviewed and are negative. Past Medical History:  Diagnosis Date   Diabetes mellitus without complication ([place])    Hypertension    Stroke ([place])    visual impaired    Past Surgical History:  Procedure Laterality Date   ABDOMINAL HYSTERECTOMY     TONSILLECTOMY     Family History  Problem Relation Age of Onset   Diabetes Father    Social History:  reports that she has never smoked. She has never used smokeless tobacco. She reports that she does not drink alcohol and does not use drugs. Allergies:  Allergies  Allergen Reactions   Metformin Other (See Comments)    Reaction: unknown   Penicillins Hives, Nausea And Vomiting and Swelling    Has patient had a PCN reaction causing immediate rash, facial/tongue/throat swelling, SOB or lightheadedness with hypotension: Yes Has patient had a PCN reaction causing severe rash involving mucus membranes or skin necrosis: No Has  patient had a PCN reaction that required hospitalization Yes Has patient had a PCN reaction occurring within the last 10 years: No If all of the above answers are "NO", then may proceed with  Cephalosporin use.    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   Medications Prior to Admission  Medication Sig Dispense Refill   acetaminophen (TYLENOL) 500 MG tablet Take 1,000 mg by mouth daily as needed for pain.     amLODipine (NORVASC) 10 MG tablet Take 10 mg by mouth daily.     carvedilol (COREG) 12.5 MG tablet Take 25 mg by mouth 2 (two) times daily.     hydrALAZINE (APRESOLINE) 25 MG tablet Take 25 mg by mouth 3 (three) times daily.     hydrochlorothiazide (HYDRODIURIL) 50 MG tablet Take 50 mg by mouth daily.     insulin aspart (NOVOLOG FLEXPEN) 100 UNIT/ML FlexPen Inject 2 Units into the skin 3 (three) times daily with meals. 15 mL 11   insulin degludec (TRESIBA FLEXTOUCH) 100 UNIT/ML SOPN FlexTouch Pen Inject 30 Units into the skin daily.     lisinopril (ZESTRIL) 20 MG tablet Take 20 mg by mouth daily.     rosuvastatin (CRESTOR) 20 MG tablet Take 20 mg by mouth at bedtime.     spironolactone (ALDACTONE) 50 MG tablet Take 150 mg by mouth daily.     aspirin 81 MG chewable tablet Chew 81 mg by mouth every morning. (Patient not taking: Reported on 01/26/2022)     pregabalin (LYRICA) 75 MG capsule Take 75 mg by mouth 2 (two) times daily. (Patient not taking: Reported on 01/26/2022)        Home: Home Living Family/patient expects to be discharged to:: Private residence Living Arrangements: Spouse/significant other Available Help at Discharge: Family, Available 24 hours/day Type of Home: House Home Access: Stairs to enter [employer] of Steps: 3 Entrance Stairs-Rails: None Home Layout: One level Bathroom Shower/Tub: Chiropodist: Standard Bathroom Accessibility: Yes Home Equipment: Conservation officer, nature (2 wheels)  Lives With: Spouse   Functional History: Prior Function Prior Level of Function : Independent/Modified Independent  Functional Status:   Mobility: Bed Mobility Overal bed mobility: Needs Assistance Bed Mobility: Rolling, Supine to Sit, Sit to Supine Rolling: Min assist, Mod assist Supine to sit: Mod assist Sit to supine: Max assist General bed mobility comments: assistance for BLE and trunk support. verbal cues for sequencing and technique. increased time and effort required Transfers Overall transfer level: Needs assistance Equipment used: Rolling walker (2 wheels) Transfers: Bed to chair/wheelchair/BSC Sit to Stand: Max assist, +2 safety/equipment, +2 physical assistance Bed to/from chair/wheelchair/BSC transfer type:: Lateral/scoot transfer  Lateral/Scoot Transfers: Max assist, +2 physical assistance, Total assist General transfer comment: MAX initial scoot decreasing to TOTAL x2 for safe completion Ambulation/Gait Ambulation/Gait assistance: Min assist Gait Distance (Feet): 2 Feet Assistive device: Rolling walker (2 wheels) Gait Pattern/deviations: Step-to pattern, Decreased stride length, Decreased stance time - right General Gait Details: decreased weight acceptance on RLE. patient complains of some pain in the right ankle. difficulty with advancement of RLE and unable to take a step backward. further standing activity limited by fatigue Gait velocity: decreased    ADL: ADL Overall ADL's : Needs assistance/impaired Grooming: Wash/dry face, Maximal assistance, Cueing for sequencing Lower Body Dressing: Maximal assistance, Cueing for sequencing Toilet Transfer: Maximal assistance, Squat-pivot, +2 for physical assistance  Toilet Transfer Details (indicate cue type and reason): simulated to bedside chair step by step vcs, pt attempted to assist with LUE Toileting- Clothing Manipulation and Hygiene: Total assistance General ADL Comments: MIN A self-feeding seated EOB - cues for aspiration pcns and multimodal cues to use RUE as gross stabilizer. Initiated OOB t/f however pt demonstrated increased difficulty following  multistep commands to maintain RLE NWBing and sequence t/f  - ultimately required +2 assist for safe lateral scoots with TOTAL LOB. MIN A for R rolling, MOD A for L rolling to complete bed levle pericare. MIN A don/doff gown at bed level - assist to thread over R shoulder  Cognition: Cognition Overall Cognitive Status: Impaired/Different from baseline Arousal/Alertness: Awake/alert Orientation Level: Disoriented to time, Disoriented to situation, Disoriented to place Attention: Focused Focused Attention: Impaired Memory: Appears intact Awareness: Appears intact Cognition Arousal/Alertness: Awake/alert Behavior During Therapy: Flat affect Overall Cognitive Status: Impaired/Different from baseline Area of Impairment: Safety/judgement, Following commands, Memory Current Attention Level: Focused Memory: Decreased recall of precautions, Decreased short-term memory Following Commands: Follows one step commands inconsistently Safety/Judgement: Decreased awareness of safety, Decreased awareness of deficits Awareness: Intellectual Problem Solving: Slow processing, Decreased initiation, Difficulty sequencing, Requires verbal cues, Requires tactile cues General Comments: difficulty sequencing transfer, requires multimodal cues for safety.  Physical Exam: Blood pressure 135/72, pulse 65, temperature 98.1 F (36.7 C), resp. rate 12, height 5\' 2"  (1.575 m), weight 74.2 kg, SpO2 100 %. Physical Exam  Results for orders placed or performed during the hospital encounter of 01/25/22 (from the past 48 hour(s))  Glucose, capillary     Status: Abnormal   Collection Time: 01/30/22  8:12 AM  Result Value Ref Range   Glucose-Capillary 250 (H) 70 - 99 mg/dL    Comment: Glucose reference range applies only to samples taken after fasting for at least 8 hours.  Glucose, capillary     Status: Abnormal   Collection Time: 01/30/22 11:52 AM  Result Value Ref Range   Glucose-Capillary 266 (H) 70 - 99 mg/dL     Comment: Glucose reference range applies only to samples taken after fasting for at least 8 hours.  Basic metabolic panel     Status: Abnormal   Collection Time: 01/30/22  3:08 PM  Result Value Ref Range   Sodium 134 (L) 135 - 145 mmol/L   Potassium 4.8 3.5 - 5.1 mmol/L   Chloride 102 98 - 111 mmol/L   CO2 25 22 - 32 mmol/L   Glucose, Bld 312 (H) 70 - 99 mg/dL    Comment: Glucose reference range applies only to samples taken after fasting for at least 8 hours.   BUN 47 (H) 8 - 23 mg/dL   Creatinine, Ser 2.42 (H) 0.44 - 1.00 mg/dL   Calcium 8.1 (L) 8.9 - 10.3 mg/dL   GFR, Estimated 22 (L) >60 mL/min    Comment: (NOTE) Calculated using the CKD-EPI Creatinine Equation (2021)    Anion gap 7 5 - 15    Comment: Performed at [facility], [address] [place], [place] [address]  Glucose, capillary     Status: Abnormal   Collection Time: 01/30/22  3:41 PM  Result Value Ref Range   Glucose-Capillary 306 (H) 70 - 99 mg/dL    Comment: Glucose reference range applies only to samples taken after fasting for at least 8 hours.  Glucose, capillary     Status: Abnormal   Collection Time: 01/30/22  7:35 PM  Result Value Ref Range   Glucose-Capillary  165 (H) 70 - 99 mg/dL    Comment: Glucose reference range applies only to samples taken after fasting for at least 8 hours.  Glucose, capillary     Status: None   Collection Time: 01/31/22  7:36 AM  Result Value Ref Range   Glucose-Capillary 90 70 - 99 mg/dL    Comment: Glucose reference range applies only to samples taken after fasting for at least 8 hours.  Basic metabolic panel     Status: Abnormal   Collection Time: 01/31/22 11:19 AM  Result Value Ref Range   Sodium 133 (L) 135 - 145 mmol/L   Potassium 4.7 3.5 - 5.1 mmol/L   Chloride 103 98 - 111 mmol/L   CO2 23 22 - 32 mmol/L   Glucose, Bld 186 (H) 70 - 99 mg/dL    Comment: Glucose reference range applies only to samples taken after fasting for at least 8 hours.   BUN 41 (H) 8 -  23 mg/dL   Creatinine, Ser 2.28 (H) 0.44 - 1.00 mg/dL   Calcium 7.9 (L) 8.9 - 10.3 mg/dL   GFR, Estimated 24 (L) >60 mL/min    Comment: (NOTE) Calculated using the CKD-EPI Creatinine Equation (2021)    Anion gap 7 5 - 15    Comment: Performed at [facility], [place] [place], [place] [address]  Glucose, capillary     Status: Abnormal   Collection Time: 01/31/22 11:50 AM  Result Value Ref Range   Glucose-Capillary 192 (H) 70 - 99 mg/dL    Comment: Glucose reference range applies only to samples taken after fasting for at least 8 hours.  Glucose, capillary     Status: Abnormal   Collection Time: 01/31/22  4:24 PM  Result Value Ref Range   Glucose-Capillary 236 (H) 70 - 99 mg/dL    Comment: Glucose reference range applies only to samples taken after fasting for at least 8 hours.  Glucose, capillary     Status: Abnormal   Collection Time: 01/31/22 10:31 PM  Result Value Ref Range   Glucose-Capillary 179 (H) 70 - 99 mg/dL    Comment: Glucose reference range applies only to samples taken after fasting for at least 8 hours.   No results found.    Blood pressure 135/72, pulse 65, temperature 98.1 F (36.7 C), resp. rate 12, height 5\' 2"  (1.575 m), weight 74.2 kg, SpO2 100 %.  Medical Problem List and Plan: 1. Functional deficits secondary to large acute to subacute infarct posterior medial left temporal and occipital lobe and left thalamus in the setting of multiple intracranial stenosis as well as history of CVA with left-sided weakness  -patient may *** shower  -ELOS/Goals: *** 2.  Antithrombotics: -DVT/anticoagulation:  Pharmaceutical: Lovenox  -antiplatelet therapy: Aspirin 81 mg daily and Plavix 75 mg daily x90 days 3. Pain Management: Lyrica 75 mg twice daily, hydrocodone as needed 4. Mood: Provide emotional support  -antipsychotic agents: N/A 5. Neuropsych: This patient is capable of making decisions on her own behalf. 6. Skin/Wound Care: Routine skin  checks 7. Fluids/Electrolytes/Nutrition: Routine in and outs with follow-up chemistries 8.  Right distal fibular metaphysis fracture with minimal lateral displacement.  Nonsurgical.  Follow-up orthopedic services Dr.Poggi.  Nonweightbearing right lower extremity 9.  Diabetes mellitus.  Hemoglobin A1c 11.1.  Semglee 18 units daily.  Check blood sugars before meals and at bedtime 10.  Seizure prophylaxis.  Keppra 250 mg twice daily. 11.  Permissive hypertension.  Patient on amlodipine 10 mg daily, hydralazine 25  mg 3 times daily, Coreg 25 mg twice daily, Aldactone 150 mg daily prior to admission.  Resume as needed 12.  Diastolic congestive heart failure.  Monitor for any signs of fluid overload 13.  AKI on CKD stage III.  Follow-up chemistries   Cathlyn Parsons, PA-C 02/01/2022

## 2022-02-01 NOTE — Progress Notes (Signed)
At bedside. Attempted to secure PIV access. Spoke with Dr. Lyn Hollingshead regarding inability to obtain access via 2 VAST RN's. Utilizing ultrasound, right arm assessed thoroughly and identified the brachial veins were compressible. Attempted x 2 with no success. Able to penetrate the vessel, however, unable to thread completely. MD ok with no access at present, and will make a decision on placing a PICC.

## 2022-02-01 NOTE — Progress Notes (Signed)
Inpatient Rehab Admissions Coordinator:   I do not have a CIR bed for Pt.this AM as I continue to await insurance auth. If I receive insurance auth later in the day, I may be able to offer a bed. I will update chart accordingly.   Megan Salon, MS, CCC-SLP Rehab Admissions Coordinator  [phone] (celll) [phone] (office)

## 2022-02-01 NOTE — Progress Notes (Signed)
PT Cancellation Note  Patient Details Name: Diamone Whistler MRN: [id number] DOB: 14-Aug-1961   Cancelled Treatment:    Reason Eval/Treat Not Completed: Other (comment). PT to hold today due to elevated K+, PT to re-attempt tomorrow pending medical appropriateness.    Olga Coaster PT, DPT 2:01 PM,02/01/22

## 2022-02-02 ENCOUNTER — Other Ambulatory Visit: Payer: Self-pay

## 2022-02-02 ENCOUNTER — Encounter (HOSPITAL_COMMUNITY): Payer: Self-pay | Admitting: Physical Medicine and Rehabilitation

## 2022-02-02 ENCOUNTER — Inpatient Hospital Stay (HOSPITAL_COMMUNITY)
Admission: RE | Admit: 2022-02-02 | Discharge: 2022-02-18 | DRG: 057 | Disposition: A | Discharge status: Home Health | Payer: Medicare HMO | Source: Other Acute Inpatient Hospital | Attending: Physical Medicine and Rehabilitation | Admitting: Physical Medicine and Rehabilitation

## 2022-02-02 DIAGNOSIS — E669 Obesity, unspecified: Secondary | ICD-10-CM | POA: Diagnosis present

## 2022-02-02 DIAGNOSIS — Z90710 Acquired absence of both cervix and uterus: Secondary | ICD-10-CM | POA: Diagnosis not present

## 2022-02-02 DIAGNOSIS — Z66 Do not resuscitate: Secondary | ICD-10-CM | POA: Diagnosis not present

## 2022-02-02 DIAGNOSIS — I248 Other forms of acute ischemic heart disease: Secondary | ICD-10-CM | POA: Diagnosis present

## 2022-02-02 DIAGNOSIS — Z7189 Other specified counseling: Secondary | ICD-10-CM

## 2022-02-02 DIAGNOSIS — E1122 Type 2 diabetes mellitus with diabetic chronic kidney disease: Secondary | ICD-10-CM | POA: Diagnosis present

## 2022-02-02 DIAGNOSIS — H538 Other visual disturbances: Secondary | ICD-10-CM | POA: Diagnosis present

## 2022-02-02 DIAGNOSIS — K5900 Constipation, unspecified: Secondary | ICD-10-CM | POA: Diagnosis present

## 2022-02-02 DIAGNOSIS — Z880 Allergy status to penicillin: Secondary | ICD-10-CM | POA: Diagnosis not present

## 2022-02-02 DIAGNOSIS — I69351 Hemiplegia and hemiparesis following cerebral infarction affecting right dominant side: Principal | ICD-10-CM

## 2022-02-02 DIAGNOSIS — I69320 Aphasia following cerebral infarction: Secondary | ICD-10-CM | POA: Diagnosis not present

## 2022-02-02 DIAGNOSIS — Z888 Allergy status to other drugs, medicaments and biological substances status: Secondary | ICD-10-CM

## 2022-02-02 DIAGNOSIS — I130 Hypertensive heart and chronic kidney disease with heart failure and stage 1 through stage 4 chronic kidney disease, or unspecified chronic kidney disease: Secondary | ICD-10-CM | POA: Diagnosis present

## 2022-02-02 DIAGNOSIS — W1830XD Fall on same level, unspecified, subsequent encounter: Secondary | ICD-10-CM | POA: Diagnosis not present

## 2022-02-02 DIAGNOSIS — E11649 Type 2 diabetes mellitus with hypoglycemia without coma: Secondary | ICD-10-CM | POA: Diagnosis not present

## 2022-02-02 DIAGNOSIS — R1310 Dysphagia, unspecified: Secondary | ICD-10-CM | POA: Diagnosis present

## 2022-02-02 DIAGNOSIS — Z885 Allergy status to narcotic agent status: Secondary | ICD-10-CM | POA: Diagnosis not present

## 2022-02-02 DIAGNOSIS — E1165 Type 2 diabetes mellitus with hyperglycemia: Secondary | ICD-10-CM | POA: Diagnosis not present

## 2022-02-02 DIAGNOSIS — I5032 Chronic diastolic (congestive) heart failure: Secondary | ICD-10-CM | POA: Diagnosis present

## 2022-02-02 DIAGNOSIS — Z298 Encounter for other specified prophylactic measures: Secondary | ICD-10-CM | POA: Diagnosis not present

## 2022-02-02 DIAGNOSIS — Z7985 Long-term (current) use of injectable non-insulin antidiabetic drugs: Secondary | ICD-10-CM

## 2022-02-02 DIAGNOSIS — E1169 Type 2 diabetes mellitus with other specified complication: Secondary | ICD-10-CM | POA: Diagnosis not present

## 2022-02-02 DIAGNOSIS — Z7982 Long term (current) use of aspirin: Secondary | ICD-10-CM

## 2022-02-02 DIAGNOSIS — I6381 Other cerebral infarction due to occlusion or stenosis of small artery: Secondary | ICD-10-CM | POA: Diagnosis not present

## 2022-02-02 DIAGNOSIS — I69391 Dysphagia following cerebral infarction: Secondary | ICD-10-CM | POA: Diagnosis not present

## 2022-02-02 DIAGNOSIS — N183 Chronic kidney disease, stage 3 unspecified: Secondary | ICD-10-CM | POA: Diagnosis present

## 2022-02-02 DIAGNOSIS — I639 Cerebral infarction, unspecified: Secondary | ICD-10-CM | POA: Diagnosis not present

## 2022-02-02 DIAGNOSIS — I69354 Hemiplegia and hemiparesis following cerebral infarction affecting left non-dominant side: Secondary | ICD-10-CM | POA: Diagnosis not present

## 2022-02-02 DIAGNOSIS — Z6830 Body mass index (BMI) 30.0-30.9, adult: Secondary | ICD-10-CM

## 2022-02-02 DIAGNOSIS — I10 Essential (primary) hypertension: Secondary | ICD-10-CM | POA: Diagnosis not present

## 2022-02-02 DIAGNOSIS — S82831D Other fracture of upper and lower end of right fibula, subsequent encounter for closed fracture with routine healing: Secondary | ICD-10-CM | POA: Diagnosis not present

## 2022-02-02 DIAGNOSIS — S82831A Other fracture of upper and lower end of right fibula, initial encounter for closed fracture: Secondary | ICD-10-CM | POA: Diagnosis not present

## 2022-02-02 DIAGNOSIS — I69398 Other sequelae of cerebral infarction: Secondary | ICD-10-CM | POA: Diagnosis not present

## 2022-02-02 DIAGNOSIS — E785 Hyperlipidemia, unspecified: Secondary | ICD-10-CM | POA: Diagnosis present

## 2022-02-02 DIAGNOSIS — N179 Acute kidney failure, unspecified: Secondary | ICD-10-CM | POA: Diagnosis present

## 2022-02-02 DIAGNOSIS — Z794 Long term (current) use of insulin: Secondary | ICD-10-CM

## 2022-02-02 DIAGNOSIS — Z79899 Other long term (current) drug therapy: Secondary | ICD-10-CM

## 2022-02-02 DIAGNOSIS — Z713 Dietary counseling and surveillance: Secondary | ICD-10-CM

## 2022-02-02 LAB — COMPREHENSIVE METABOLIC PANEL
ALT: 15 U/L (ref 0–44)
AST: 36 U/L (ref 15–41)
Albumin: 2.8 g/dL — ABNORMAL LOW (ref 3.5–5.0)
Alkaline Phosphatase: 118 U/L (ref 38–126)
Anion gap: 9 (ref 5–15)
BUN: 37 mg/dL — ABNORMAL HIGH (ref 8–23)
CO2: 23 mmol/L (ref 22–32)
Calcium: 8.5 mg/dL — ABNORMAL LOW (ref 8.9–10.3)
Chloride: 102 mmol/L (ref 98–111)
Creatinine, Ser: 1.96 mg/dL — ABNORMAL HIGH (ref 0.44–1.00)
GFR, Estimated: 28 mL/min — ABNORMAL LOW (ref >60)
Glucose, Bld: 120 mg/dL — ABNORMAL HIGH (ref 70–99)
Potassium: 5.2 mmol/L — ABNORMAL HIGH (ref 3.5–5.1)
Sodium: 134 mmol/L — ABNORMAL LOW (ref 135–145)
Total Bilirubin: 0.6 mg/dL (ref 0.3–1.2)
Total Protein: 7.9 g/dL (ref 6.5–8.1)

## 2022-02-02 LAB — CBC
HCT: 36.9 % (ref 36.0–46.0)
HCT: 40.4 % (ref 36.0–46.0)
Hemoglobin: 12.1 g/dL (ref 12.0–15.0)
Hemoglobin: 13.2 g/dL (ref 12.0–15.0)
MCH: 27.8 pg (ref 26.0–34.0)
MCH: 27.9 pg (ref 26.0–34.0)
MCHC: 32.8 g/dL (ref 30.0–36.0)
MCHC: 32.7 g/dL (ref 30.0–36.0)
MCV: 84.8 fL (ref 80.0–100.0)
MCV: 85.4 fL (ref 80.0–100.0)
Platelets: 280 10*3/uL (ref 150–400)
Platelets: 339 10*3/uL (ref 150–400)
RBC: 4.35 MIL/uL (ref 3.87–5.11)
RBC: 4.73 MIL/uL (ref 3.87–5.11)
RDW: 13.8 % (ref 11.5–15.5)
RDW: 13.8 % (ref 11.5–15.5)
WBC: 6.8 10*3/uL (ref 4.0–10.5)
WBC: 5.9 10*3/uL (ref 4.0–10.5)
nRBC: 0 % (ref 0.0–0.2)
nRBC: 0 % (ref 0.0–0.2)

## 2022-02-02 LAB — GLUCOSE, CAPILLARY
Glucose-Capillary: 123 mg/dL — ABNORMAL HIGH (ref 70–99)
Glucose-Capillary: 174 mg/dL — ABNORMAL HIGH (ref 70–99)
Glucose-Capillary: 275 mg/dL — ABNORMAL HIGH (ref 70–99)
Glucose-Capillary: 276 mg/dL — ABNORMAL HIGH (ref 70–99)

## 2022-02-02 LAB — CREATININE, SERUM
Creatinine, Ser: 2.16 mg/dL — ABNORMAL HIGH (ref 0.44–1.00)
GFR, Estimated: 25 mL/min — ABNORMAL LOW (ref >60)

## 2022-02-02 MED ORDER — PREGABALIN 75 MG PO CAPS
75.0000 mg | ORAL_CAPSULE | Freq: Two times a day (BID) | ORAL | Status: DC
  Administered 2022-02-02 – 2022-02-14 (×24): 75 mg via ORAL
  Filled 2022-02-02 (×24): qty 1

## 2022-02-02 MED ORDER — INSULIN ASPART 100 UNIT/ML IJ SOLN
0.0000 [IU] | Freq: Three times a day (TID) | INTRAMUSCULAR | Status: DC
  Administered 2022-02-02: 5 [IU] via SUBCUTANEOUS
  Administered 2022-02-03 (×2): 1 [IU] via SUBCUTANEOUS
  Administered 2022-02-04 – 2022-02-05 (×2): 3 [IU] via SUBCUTANEOUS
  Administered 2022-02-05 (×2): 2 [IU] via SUBCUTANEOUS
  Administered 2022-02-06: 3 [IU] via SUBCUTANEOUS
  Administered 2022-02-06: 2 [IU] via SUBCUTANEOUS
  Administered 2022-02-07: 3 [IU] via SUBCUTANEOUS
  Administered 2022-02-07: 1 [IU] via SUBCUTANEOUS
  Administered 2022-02-07: 2 [IU] via SUBCUTANEOUS

## 2022-02-02 MED ORDER — ENOXAPARIN SODIUM 30 MG/0.3ML IJ SOSY
30.0000 mg | PREFILLED_SYRINGE | INTRAMUSCULAR | Status: DC
  Administered 2022-02-02 – 2022-02-17 (×16): 30 mg via SUBCUTANEOUS
  Filled 2022-02-02 (×16): qty 0.3

## 2022-02-02 MED ORDER — LEVETIRACETAM 250 MG PO TABS
250.0000 mg | ORAL_TABLET | Freq: Two times a day (BID) | ORAL | 0 refills | Status: DC
Start: 2022-02-02 — End: 2022-02-18

## 2022-02-02 MED ORDER — HYDROCHLOROTHIAZIDE 25 MG PO TABS
25.0000 mg | ORAL_TABLET | Freq: Every day | ORAL | Status: DC
  Administered 2022-02-03 – 2022-02-18 (×16): 25 mg via ORAL
  Filled 2022-02-02 (×16): qty 1

## 2022-02-02 MED ORDER — ROSUVASTATIN CALCIUM 20 MG PO TABS
40.0000 mg | ORAL_TABLET | Freq: Every day | ORAL | Status: DC
  Administered 2022-02-02 – 2022-02-17 (×16): 40 mg via ORAL
  Filled 2022-02-02 (×15): qty 2

## 2022-02-02 MED ORDER — SENNOSIDES-DOCUSATE SODIUM 8.6-50 MG PO TABS
1.0000 | ORAL_TABLET | Freq: Every evening | ORAL | Status: DC | PRN
  Administered 2022-02-03 – 2022-02-15 (×6): 1 via ORAL
  Filled 2022-02-02 (×6): qty 1

## 2022-02-02 MED ORDER — ACETAMINOPHEN 325 MG PO TABS
650.0000 mg | ORAL_TABLET | ORAL | Status: DC | PRN
Start: 2022-02-02 — End: 2022-02-18
  Filled 2022-02-02: qty 2

## 2022-02-02 MED ORDER — CLOPIDOGREL BISULFATE 75 MG PO TABS
75.0000 mg | ORAL_TABLET | Freq: Every day | ORAL | Status: DC
  Administered 2022-02-03 – 2022-02-18 (×16): 75 mg via ORAL
  Filled 2022-02-02 (×16): qty 1

## 2022-02-02 MED ORDER — INSULIN ASPART 100 UNIT/ML IJ SOLN: 0.0000 [IU] | Freq: Every day | INTRAMUSCULAR | 11 refills | Status: DC

## 2022-02-02 MED ORDER — INSULIN GLARGINE-YFGN 100 UNIT/ML ~~LOC~~ SOLN
18.0000 [IU] | Freq: Every day | SUBCUTANEOUS | Status: DC
  Administered 2022-02-03: 18 [IU] via SUBCUTANEOUS
  Filled 2022-02-02: qty 0.18

## 2022-02-02 MED ORDER — ASPIRIN 81 MG PO CHEW
81.0000 mg | CHEWABLE_TABLET | Freq: Every morning | ORAL | Status: DC
  Administered 2022-02-03 – 2022-02-17 (×14): 81 mg via ORAL
  Filled 2022-02-02 (×16): qty 1

## 2022-02-02 MED ORDER — INSULIN GLARGINE-YFGN 100 UNIT/ML ~~LOC~~ SOLN: 18.0000 [IU] | Freq: Every day | SUBCUTANEOUS | 11 refills | Status: DC

## 2022-02-02 MED ORDER — LEVETIRACETAM 250 MG PO TABS
250.0000 mg | ORAL_TABLET | Freq: Two times a day (BID) | ORAL | Status: DC
  Administered 2022-02-02 – 2022-02-10 (×16): 250 mg via ORAL
  Filled 2022-02-02 (×16): qty 1

## 2022-02-02 MED ORDER — HYDROCHLOROTHIAZIDE 25 MG PO TABS: 25.0000 mg | ORAL_TABLET | Freq: Every day | ORAL | 0 refills | Status: DC

## 2022-02-02 MED ORDER — ROSUVASTATIN CALCIUM 40 MG PO TABS: 40.0000 mg | ORAL_TABLET | Freq: Every day | ORAL | 0 refills | Status: DC

## 2022-02-02 MED ORDER — ENOXAPARIN SODIUM 30 MG/0.3ML IJ SOSY: 30.0000 mg | PREFILLED_SYRINGE | INTRAMUSCULAR | Status: DC

## 2022-02-02 MED ORDER — ACETAMINOPHEN 650 MG RE SUPP: 650.0000 mg | RECTAL | Status: DC | PRN

## 2022-02-02 MED ORDER — ACETAMINOPHEN 160 MG/5ML PO SOLN
650.0000 mg | ORAL | Status: DC | PRN
Start: 2022-02-02 — End: 2022-02-18

## 2022-02-02 MED ORDER — INSULIN ASPART 100 UNIT/ML IJ SOLN: 0.0000 [IU] | Freq: Three times a day (TID) | INTRAMUSCULAR | 11 refills | Status: DC

## 2022-02-02 MED ORDER — CLOPIDOGREL BISULFATE 75 MG PO TABS: 75.0000 mg | ORAL_TABLET | Freq: Every day | ORAL | 0 refills | Status: DC

## 2022-02-02 MED ORDER — HYDROCODONE-ACETAMINOPHEN 7.5-325 MG PO TABS
1.0000 | ORAL_TABLET | Freq: Four times a day (QID) | ORAL | Status: DC | PRN
  Administered 2022-02-04 – 2022-02-06 (×2): 1 via ORAL
  Filled 2022-02-02 (×2): qty 1

## 2022-02-02 NOTE — Discharge Summary (Signed)
Physician Discharge Summary   Patient: [name] Gilbert MRN: [id number] DOB: Feb 27, 1961  Admit date:     01/25/2022  Discharge date: 02/02/22  Discharge Physician: Emeterio Reeve   PCP: Christella Scheuermann, MD   Recommendations at discharge:  To inpatient rehab.  Please follow up on BMP - hyperkalemia here likely false elevation but would monitor.  Discharge Diagnoses: Principal Problem:   Stroke [facility]) Active Problems:   Elevated troponin   HTN (hypertension)   Hypertensive emergency   HLD (hyperlipidemia)   Type II diabetes mellitus with renal manifestations (HCC)   Stage 3b chronic kidney disease (CKD) (HCC)   Chronic diastolic CHF (congestive heart failure) ([place])   Closed fracture of right distal fibula   Hyperkalemia   Advanced care planning/counseling discussion    Brief Narrative / Hospital Course:  [name] Gilbert is a 61 y.o. 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  Pt states that she had history of 3 strokes in the past.  She has chronic left-sided weakness.  She states that she has been feeling weaker in the 2 days prior to presentation to ED.  She felt like her left-sided weakness has worsened. She fell in the early morning prior to admission. No LOC per her husband.  CT head showed relatively large acute/subacute infarct posterior and medial left temporal lobe new since February, otherwise stable.  MRI showed confluent acute infarct in medial left temporal lobe with additional punctate infarct in left thalamus, associated edema, no mass effect, remote infarcts, chronic microvascular disease.  Neurology consulted.  Antihypertensives initially held, patient to continue on aspirin, Plavix, DAPT for 90 days.  Patient was also noted to have right distal fibular fracture status post fall, evaluated by  orthopedics, splinted and nonweightbearing pending placement for rehab.  Noted to have acute on chronic kidney disease, baseline creatinine 1.4-1.7, creatinine over course of admission has been 2.2-2.4 with good urine output.  Hyperkalemia on labs 02/01/2022, no IV access, normal EKG and unable to give calcium, treated w/ 5 units insulin w/ plan to repeat dose if K not improving, repeat K was significantly better which supports likely lab error. Repeat on morning of discharge 02/02/22 also slight elevation, pt complains the lab had trouble drawing blood. Spoke w/ husband today - reports he is in agreement for no CPR/intubation for her, she is not engaging in discussion today re: code status    Consultants:  Neurology Palliative Care  Orthopedics   Procedures: none  Assessment and Plan:  * Stroke [facility]) Patient states that she had 3 strokes in the past.  Patient has chronic left-sided weakness, which has worsened.  CT head showed relatively large acute to subacute infarct of the posterior and mesial Left temporal lobe, which is new since February.  Patient has elevated blood pressure 204/98.  Initially Cardene drip was started in ED. Consulted Dr. Leonel Ramsay neurology, recommended permissive hypertension for SBP > 220 and dBP> 120. ASA and statin were continued, antihypertensives held but added back lower dose HCTZ 05/24, MRI/MRA and Echo as below. Continue Keppra. PT/OT following. Awaiting placement for rehab.   Elevated troponin Troponin 62.  Denies chest pain or shortness of breath.  Likely demand ischemia  Hypertensive emergency Resolved See assessment and plan under hypertension  HTN (hypertension) HTN and hypertensive emergency: Blood pressure 204/98. Initially allowing for permissive hypertension per neurology's recommendation Held home amlodipine, coreg, HCTZ,  lisinopril, spironolactone Will continue to hold spironolactone in light of hyperkalemia 05/24, will restart HCTZ given higher  BP though this will probably not have short term effect on potassium, also started back at 25 mg instead of 50 mg given Cr/GFR.  As needed enalaprilate for SBP> 220 and dBP > 120   Advanced care planning/counseling discussion Patient seems confused / uncertain about code status decision. Her husband notes she has had much more difficulty with memory problems, concentration since her strokes.  Per palliative care note, patient was initially in agreement for no CPR, patient had slightly different discussion with me later in the day, I spoke with her husband at length this morning regarding CODE STATUS, he is in agreement that given her multiple medical problems she is unlikely to recover from a serious cardiac/respiratory arrest with a quality of life that she would find meaningful/acceptable.  He states that since losing her independence from multiple strokes, she has certainly struggled with her limitations.  Given patient's limited capacity, I think CODE STATUS of DNR is appropriate per her husband who is legal surrogate decision maker.  Patient also has a daughter who is involved in her care, and was present for discussion with palliative care on 02/01/2022  Hyperkalemia 5.7 on labs 05/24 EKG ordered, pending as of this note, calcium ordered Insulin w/ novolog 5 units since unable to given IV D50 d/t loss of IV access, IV team has been asked to evaluate  If Glc ok and K still high, will given additional insulin / other  Uncertain why high vs lab error, will monitor, Cr as noted renal function is worse from baseline but not acutely decompensated Cont hold spironolactone  Encourage po hydration to maintain renal fxn    Closed fracture of right distal fibula Pending rehab placement   Chronic diastolic CHF (congestive heart failure) ([place]) 2D echo on 06/13/2021 showed EF of 55-60% with grade 1 diastolic dysfunction.  Patient does not have edema, no shortness of breath or JVD.  CHF seems to be  compensated. Held spironolactone and HCTZ. Will continue to hold spironolactone in light of hyperkalemia 05/24, will restart HCTZ given higher BP though this will probably not have short term effect on potassium, also started back at 25 mg instead of 50 mg given Cr/GFR.   Stage 3b chronic kidney disease (CKD) (HCC) Recent baseline creatinine 1.4-1.7.  Cr has trended up to max 2.42* and today 05/24 was 2.10   Type II diabetes mellitus with renal manifestations (HCC) Recent A1c 11.6, poorly controlled.  SSI and glargine          Disposition: Rehabilitation facility Diet recommendation:  Discharge Diet Orders (From admission, onward)     Start     Ordered   02/02/22 0000  Diet - low sodium heart healthy        02/02/22 1216   02/02/22 0000  Diet Carb Modified       Comments: Note  - patient has history of nonadherence to diet. Given malnutrition risk, can change to regular diet if desired if this is the only way she will get adequate caloric intake   02/02/22 1216           Cardiac and Carb modified diet DISCHARGE MEDICATION: Allergies as of 02/02/2022       Reactions   Metformin Other (See Comments)   Reaction: unknown   Penicillins Hives, Nausea And Vomiting, Swelling   Has patient had a PCN reaction causing immediate rash, facial/tongue/throat swelling, SOB  or lightheadedness with hypotension: Yes Has patient had a PCN reaction causing severe rash involving mucus membranes or skin necrosis: No Has patient had a PCN reaction that required hospitalization Yes Has patient had a PCN reaction occurring within the last 10 years: No If all of the above answers are "NO", then may proceed with Cephalosporin use.   Codeine Other (See Comments)   Reaction: unknown   Hydralazine    Stomach pain   Statins Other (See Comments)   Myopathy with atorva 80        Medication List     STOP taking these medications    amLODipine 10 MG tablet Commonly known as: NORVASC    carvedilol 12.5 MG tablet Commonly known as: COREG   hydrALAZINE 25 MG tablet Commonly known as: APRESOLINE   insulin aspart 100 UNIT/ML FlexPen Commonly known as: NovoLOG FlexPen Replaced by: insulin aspart 100 UNIT/ML injection   lisinopril 20 MG tablet Commonly known as: ZESTRIL   spironolactone 50 MG tablet Commonly known as: ALDACTONE   Tresiba FlexTouch 100 UNIT/ML FlexTouch Pen Generic drug: insulin degludec       TAKE these medications    acetaminophen 500 MG tablet Commonly known as: TYLENOL Take 1,000 mg by mouth daily as needed for pain.   aspirin 81 MG chewable tablet Chew 81 mg by mouth every morning.   clopidogrel 75 MG tablet Commonly known as: PLAVIX Take 1 tablet (75 mg total) by mouth daily. Start taking on: Feb 03, 2022   hydrochlorothiazide 25 MG tablet Commonly known as: HYDRODIURIL Take 1 tablet (25 mg total) by mouth daily. Start taking on: Feb 03, 2022 What changed:  medication strength how much to take   insulin aspart 100 UNIT/ML injection Commonly known as: novoLOG Inject 0-9 Units into the skin 3 (three) times daily with meals. Replaces: insulin aspart 100 UNIT/ML FlexPen   insulin aspart 100 UNIT/ML injection Commonly known as: novoLOG Inject 0-5 Units into the skin at bedtime.   insulin glargine-yfgn 100 UNIT/ML injection Commonly known as: SEMGLEE Inject 0.18 mLs (18 Units total) into the skin daily. Start taking on: Feb 03, 2022   levETIRAcetam 250 MG tablet Commonly known as: KEPPRA Take 1 tablet (250 mg total) by mouth 2 (two) times daily.   pregabalin 75 MG capsule Commonly known as: LYRICA Take 75 mg by mouth 2 (two) times daily.   rosuvastatin 40 MG tablet Commonly known as: CRESTOR Take 1 tablet (40 mg total) by mouth at bedtime. What changed:  medication strength how much to take        Follow-up Information     Christella Scheuermann, MD .   Specialty: Internal Medicine Contact information: [address] [place] [address] [phone]                Discharge Exam: Danley Danker Weights   01/25/22 [id number] 01/25/22 2100  Weight: 82 kg 74.2 kg   Examination:  Constitutional:  VS as above General Appearance: alert, NAD Ears, Nose, Mouth, Throat: Normal appearance MM Dry Neck: No masses, trachea midline Respiratory: Normal respiratory effort Breath sounds normal, no wheeze/rhonchi/rales Cardiovascular: S1/S2 normal No lower extremity edema on L, R leg is bandaged/splinted Gastrointestinal: Nontender, no masses Neurological: L sided weakness stable Psychiatric: Fair to judgment/insight - she is alert and follows commands, is minimally talkative  Flat mood and affect   Condition at discharge: fair  The results of significant diagnostics from this hospitalization (including imaging, microbiology, ancillary and laboratory) are listed  below for reference.   Imaging Studies: DG Ankle Complete Right  Result Date: 01/26/2022 CLINICAL DATA:  Right ankle pain after fall 2 days ago EXAM: RIGHT ANKLE - COMPLETE 3+ VIEW COMPARISON:  None Available. FINDINGS: Acute obliquely oriented fracture of the distal fibular metaphysis with minimal lateral displacement. No fracture identified of the medial or posterior malleoli. Ankle mortise is congruent. No dislocation. Soft tissue swelling about the ankle. IMPRESSION: Acute obliquely oriented fracture of the distal fibular metaphysis with minimal lateral displacement. Electronically Signed   By: Davina Poke D.O.   On: 01/26/2022 14:02   CT HEAD WO CONTRAST (5MM)  Result Date: 01/27/2022 CLINICAL DATA:  Altered mental status, nontraumatic (Ped 0-17y); sudden onset right-sided weakness EXAM: CT HEAD WITHOUT CONTRAST TECHNIQUE: Contiguous axial images were obtained from the base of the skull through the vertex without intravenous contrast. RADIATION DOSE REDUCTION: This exam was performed according to the departmental  dose-optimization program which includes automated exposure control, adjustment of the mA and/or kV according to patient size and/or use of iterative reconstruction technique. COMPARISON:  01/25/2022 FINDINGS: Brain: Evolving recent infarction of the medial left temporal lobe extending into the occipital lobe. Patchy density in the left thalamus likely corresponds to acute infarction seen on the MRI. Similar regional mass effect. No hemorrhage. Unchanged chronic infarcts and chronic microvascular ischemic changes. Ventricles and sulci are stable in size and configuration. No extra-axial collection. Vascular: There is atherosclerotic calcification at the skull base. Skull: Calvarium is unremarkable. Sinuses/Orbits: No acute finding. Other: None. IMPRESSION: Evolving recent infarcts of the left temporal and occipital lobes and left thalamus with mild mass effect and no hemorrhage. Electronically Signed   By: Macy Mis M.D.   On: 01/27/2022 14:37   CT Head Wo Contrast  Result Date: 01/25/2022 CLINICAL DATA:  61 year old female status post fall with pain. Hypertensive, 0000000 systolic. Hyperglycemia. EXAM: CT HEAD WITHOUT CONTRAST TECHNIQUE: Contiguous axial images were obtained from the base of the skull through the vertex without intravenous contrast. RADIATION DOSE REDUCTION: This exam was performed according to the departmental dose-optimization program which includes automated exposure control, adjustment of the mA and/or kV according to patient size and/or use of iterative reconstruction technique. COMPARISON:  Head CT 11/01/2021 and earlier. FINDINGS: Brain: Advanced chronic ischemic disease. Multifocal cerebral encephalomalacia, maximal in the left medial parietal and occipital lobes. Fairly extensive superimposed chronic small vessel disease in the deep gray nuclei and brainstem. However, new broad-based area of cytotoxic edema in the left posterior and mesial temporal lobe (coronal image 39 versus series  4, image 40 in February). This could be acute or subacute. No hemorrhagic transformation or mass effect. Elsewhere gray-white matter differentiation is stable. No midline shift, mass effect, or evidence of intracranial mass lesion. No ventriculomegaly. Normal basilar cisterns. Vascular: Calcified atherosclerosis at the skull base. No suspicious intracranial vascular hyperdensity. Skull: Stable.  No acute osseous abnormality identified. Sinuses/Orbits: Visualized paranasal sinuses and mastoids are clear. Other: No acute orbit or scalp soft tissue injury. IMPRESSION: 1. Relatively large acute to subacute infarct of the posterior and mesial Left temporal lobe, new since February. 2. No hemorrhagic transformation or significant mass effect. 3. Otherwise stable advanced underlying chronic ischemic disease. 4. No acute traumatic injury identified. Electronically Signed   By: Genevie Ann M.D.   On: 01/25/2022 06:38   CT Cervical Spine Wo Contrast  Result Date: 01/25/2022 CLINICAL DATA:  Study identified as missing a report at 9:19 am on 01/25/2022. 61 year old female status post fall with pain.  Hypertensive, 0000000 systolic. Hyperglycemia. EXAM: CT CERVICAL SPINE WITHOUT CONTRAST TECHNIQUE: Multidetector CT imaging of the cervical spine was performed without intravenous contrast. Multiplanar CT image reconstructions were also generated. RADIATION DOSE REDUCTION: This exam was performed according to the departmental dose-optimization program which includes automated exposure control, adjustment of the mA and/or kV according to patient size and/or use of iterative reconstruction technique. COMPARISON:  Head CT the same day reported separately. FINDINGS: Alignment: Straightening of cervical lordosis. Cervicothoracic junction alignment is within normal limits. Bilateral posterior element alignment is within normal limits. Skull base and vertebrae: Visualized skull base is intact. No atlanto-occipital dissociation. C1 and C2 appear  intact and aligned. No acute osseous abnormality identified. Soft tissues and spinal canal: No prevertebral fluid or swelling. No visible canal hematoma. Negative noncontrast visible neck soft tissues, mild carotid calcified atherosclerosis. Disc levels: Mild for age cervical spine degeneration, most pronounced at C5-C6. Upper chest: Negative. IMPRESSION: 1. No acute traumatic injury identified in the cervical spine. 2. Mild for age cervical spine degeneration. Electronically Signed   By: Genevie Ann M.D.   On: 01/25/2022 09:21   MR ANGIO HEAD WO CONTRAST  Result Date: 01/25/2022 CLINICAL DATA:  Neuro deficit, acute, stroke suspected; Vertebral artery aneurysm suspected EXAM: MRI HEAD WITHOUT CONTRAST MRA HEAD WITHOUT CONTRAST MRA OF THE NECK WITHOUT AND WITH CONTRAST TECHNIQUE: Multiplanar, multi-echo pulse sequences of the brain and surrounding structures were acquired without intravenous contrast. Angiographic images of the Circle of Willis were acquired using MRA technique without intravenous contrast. Angiographic images of the neck were acquired using MRA technique without and with intravenous contrast. Carotid stenosis measurements (when applicable) are obtained utilizing NASCET criteria, using the distal internal carotid diameter as the denominator. CONTRAST:  7.40mL GADAVIST GADOBUTROL 1 MMOL/ML IV SOLN COMPARISON:  CT head Jan 25, 2022. MRA FINDINGS: MR HEAD FINDINGS Brain: Confluent acute infarct in the medial left temporal lobe with additional punctate infarct in the left thalamus. Associated edema without significant mass effect. Additional patchy T2/FLAIR hyperintensities in the white matter and pons, nonspecific but both the microvascular disease. Remote infarcts in bilateral parieto-occipital lobes. Vascular: Detailed below. Skull and upper cervical spine: Normal marrow signal. Sinuses/Orbits: Clear sinuses.  No acute orbital findings. Other: No mastoid effusions. MRA HEAD FINDINGS Anterior  circulation: Bilateral intracranial ICAs, MCAs, and ACAs are patent. Similar severe distal M1/proximal M2 MCA stenosis bilaterally. Similar absent left A1 ACA with prominent right A1 ACA, probably congenital/anatomic variant. Similar mild-to-moderate right A1 ACA stenosis. Posterior circulation: Bilateral intradural vertebral arteries, basilar artery and right posterior cerebral artery are patent without significant proximal stenosis. Left P1 PCA is a occluded with absence of flow related signal distally. MRA NECK FINDINGS Motion limited study.  Within this limitation: Aortic arch: Great vessel origins are patent. Right carotid system: Patent without evidence of significant (greater than 50%) stenosis. Left carotid system: Patent without evidence of significant (greater than 50%) stenosis. Vertebral arteries: Patent bilaterally without evidence of significant (greater than 50%) stenosis. IMPRESSION: MRI: 1. Confluent acute infarct in the medial left temporal lobe with additional punctate infarct in the left thalamus. Associated edema without significant mass effect. 2. Remote infarcts and moderate chronic microvascular disease. MRA Head: 1. Left P1 PCA occlusion, previously severely stenotic. 2. Similar severe distal M1/proximal M2 MCA stenosis bilaterally. Similar mild to moderate right A1 ACA stenosis. MRA Neck: No evidence of significant (greater than 50%) stenosis. Findings were communicated on 01/25/2022 at 11:01 am to provider Blaine Hamper via telephone, who verbally acknowledged these results.  Electronically Signed   By: Margaretha Sheffield M.D.   On: 01/25/2022 11:04   MR ANGIO NECK W WO CONTRAST  Result Date: 01/25/2022 CLINICAL DATA:  Neuro deficit, acute, stroke suspected; Vertebral artery aneurysm suspected EXAM: MRI HEAD WITHOUT CONTRAST MRA HEAD WITHOUT CONTRAST MRA OF THE NECK WITHOUT AND WITH CONTRAST TECHNIQUE: Multiplanar, multi-echo pulse sequences of the brain and surrounding structures were acquired  without intravenous contrast. Angiographic images of the Circle of Willis were acquired using MRA technique without intravenous contrast. Angiographic images of the neck were acquired using MRA technique without and with intravenous contrast. Carotid stenosis measurements (when applicable) are obtained utilizing NASCET criteria, using the distal internal carotid diameter as the denominator. CONTRAST:  7.58mL GADAVIST GADOBUTROL 1 MMOL/ML IV SOLN COMPARISON:  CT head Jan 25, 2022. MRA FINDINGS: MR HEAD FINDINGS Brain: Confluent acute infarct in the medial left temporal lobe with additional punctate infarct in the left thalamus. Associated edema without significant mass effect. Additional patchy T2/FLAIR hyperintensities in the white matter and pons, nonspecific but both the microvascular disease. Remote infarcts in bilateral parieto-occipital lobes. Vascular: Detailed below. Skull and upper cervical spine: Normal marrow signal. Sinuses/Orbits: Clear sinuses.  No acute orbital findings. Other: No mastoid effusions. MRA HEAD FINDINGS Anterior circulation: Bilateral intracranial ICAs, MCAs, and ACAs are patent. Similar severe distal M1/proximal M2 MCA stenosis bilaterally. Similar absent left A1 ACA with prominent right A1 ACA, probably congenital/anatomic variant. Similar mild-to-moderate right A1 ACA stenosis. Posterior circulation: Bilateral intradural vertebral arteries, basilar artery and right posterior cerebral artery are patent without significant proximal stenosis. Left P1 PCA is a occluded with absence of flow related signal distally. MRA NECK FINDINGS Motion limited study.  Within this limitation: Aortic arch: Great vessel origins are patent. Right carotid system: Patent without evidence of significant (greater than 50%) stenosis. Left carotid system: Patent without evidence of significant (greater than 50%) stenosis. Vertebral arteries: Patent bilaterally without evidence of significant (greater than 50%)  stenosis. IMPRESSION: MRI: 1. Confluent acute infarct in the medial left temporal lobe with additional punctate infarct in the left thalamus. Associated edema without significant mass effect. 2. Remote infarcts and moderate chronic microvascular disease. MRA Head: 1. Left P1 PCA occlusion, previously severely stenotic. 2. Similar severe distal M1/proximal M2 MCA stenosis bilaterally. Similar mild to moderate right A1 ACA stenosis. MRA Neck: No evidence of significant (greater than 50%) stenosis. Findings were communicated on 01/25/2022 at 11:01 am to provider Blaine Hamper via telephone, who verbally acknowledged these results. Electronically Signed   By: Margaretha Sheffield M.D.   On: 01/25/2022 11:04   MR BRAIN WO CONTRAST  Result Date: 01/25/2022 CLINICAL DATA:  Neuro deficit, acute, stroke suspected; Vertebral artery aneurysm suspected EXAM: MRI HEAD WITHOUT CONTRAST MRA HEAD WITHOUT CONTRAST MRA OF THE NECK WITHOUT AND WITH CONTRAST TECHNIQUE: Multiplanar, multi-echo pulse sequences of the brain and surrounding structures were acquired without intravenous contrast. Angiographic images of the Circle of Willis were acquired using MRA technique without intravenous contrast. Angiographic images of the neck were acquired using MRA technique without and with intravenous contrast. Carotid stenosis measurements (when applicable) are obtained utilizing NASCET criteria, using the distal internal carotid diameter as the denominator. CONTRAST:  7.68mL GADAVIST GADOBUTROL 1 MMOL/ML IV SOLN COMPARISON:  CT head Jan 25, 2022. MRA FINDINGS: MR HEAD FINDINGS Brain: Confluent acute infarct in the medial left temporal lobe with additional punctate infarct in the left thalamus. Associated edema without significant mass effect. Additional patchy T2/FLAIR hyperintensities in the white matter and pons, nonspecific  but both the microvascular disease. Remote infarcts in bilateral parieto-occipital lobes. Vascular: Detailed below. Skull and upper  cervical spine: Normal marrow signal. Sinuses/Orbits: Clear sinuses.  No acute orbital findings. Other: No mastoid effusions. MRA HEAD FINDINGS Anterior circulation: Bilateral intracranial ICAs, MCAs, and ACAs are patent. Similar severe distal M1/proximal M2 MCA stenosis bilaterally. Similar absent left A1 ACA with prominent right A1 ACA, probably congenital/anatomic variant. Similar mild-to-moderate right A1 ACA stenosis. Posterior circulation: Bilateral intradural vertebral arteries, basilar artery and right posterior cerebral artery are patent without significant proximal stenosis. Left P1 PCA is a occluded with absence of flow related signal distally. MRA NECK FINDINGS Motion limited study.  Within this limitation: Aortic arch: Great vessel origins are patent. Right carotid system: Patent without evidence of significant (greater than 50%) stenosis. Left carotid system: Patent without evidence of significant (greater than 50%) stenosis. Vertebral arteries: Patent bilaterally without evidence of significant (greater than 50%) stenosis. IMPRESSION: MRI: 1. Confluent acute infarct in the medial left temporal lobe with additional punctate infarct in the left thalamus. Associated edema without significant mass effect. 2. Remote infarcts and moderate chronic microvascular disease. MRA Head: 1. Left P1 PCA occlusion, previously severely stenotic. 2. Similar severe distal M1/proximal M2 MCA stenosis bilaterally. Similar mild to moderate right A1 ACA stenosis. MRA Neck: No evidence of significant (greater than 50%) stenosis. Findings were communicated on 01/25/2022 at 11:01 am to provider Clyde Lundborg via telephone, who verbally acknowledged these results. Electronically Signed   By: Feliberto Harts M.D.   On: 01/25/2022 11:04   DG Chest Portable 1 View  Result Date: 01/25/2022 CLINICAL DATA:  BIB [facility] from home due to fall X 1 hour ago, new left lower back pain and generalized weakness (for last few days). Baseline left  sided deficits from previous stroke. Hypertensive, 204/98. CBG 201 Unable to tell RN what caused fall. EXAM: PORTABLE CHEST - 1 VIEW COMPARISON:  10/02/2019 FINDINGS: Lungs are clear. Heart size and mediastinal contours are within normal limits. No effusion.  No pneumothorax. Visualized bones unremarkable. IMPRESSION: No acute cardiopulmonary disease. Electronically Signed   By: Corlis Leak M.D.   On: 01/25/2022 06:38   ECHOCARDIOGRAM COMPLETE  Result Date: 01/25/2022    ECHOCARDIOGRAM REPORT   Patient Name:   CHASTELIN MARSICANO Date of Exam: 01/25/2022 Medical Rec #:  [id number]        Height:       62.0 in Accession #:    [id number]       Weight:       180.8 lb Date of Birth:  Jan 17, 1961        BSA:          1.831 m Patient Age:    60 years         BP:           158/85 mmHg Patient Gender: F                HR:           96 bpm. Exam Location:  [facility] Procedure: 2D Echo, Cardiac Doppler and Color Doppler Indications:     Stroke I63.9  History:         Patient has prior history of Echocardiogram examinations, most                  recent 06/13/2021. Stroke; Risk Factors:Hypertension and                  Diabetes.  Sonographer:  Sherrie Sport Referring Phys:  [id number] Soledad Gerlach NIU Diagnosing Phys: Yolonda Kida MD  Sonographer Comments: Suboptimal apical window and no subcostal window. IMPRESSIONS  1. Left ventricular ejection fraction, by estimation, is 60 to 65%. The left ventricle has normal function. The left ventricle has no regional wall motion abnormalities. Left ventricular diastolic parameters are consistent with Grade I diastolic dysfunction (impaired relaxation).  2. Right ventricular systolic function is normal. The right ventricular size is normal.  3. The mitral valve is normal in structure. Trivial mitral valve regurgitation.  4. The aortic valve is normal in structure. Aortic valve regurgitation is not visualized. FINDINGS  Left Ventricle: Left ventricular ejection fraction, by estimation, is 60 to 65%. The  left ventricle has normal function. The left ventricle has no regional wall motion abnormalities. The left ventricular internal cavity size was normal in size. There is  borderline left ventricular hypertrophy. Left ventricular diastolic parameters are consistent with Grade I diastolic dysfunction (impaired relaxation). Right Ventricle: The right ventricular size is normal. No increase in right ventricular wall thickness. Right ventricular systolic function is normal. Left Atrium: Left atrial size was normal in size. Right Atrium: Right atrial size was normal in size. Pericardium: There is no evidence of pericardial effusion. Mitral Valve: The mitral valve is normal in structure. Trivial mitral valve regurgitation. MV peak gradient, 4.7 mmHg. The mean mitral valve gradient is 2.0 mmHg. Tricuspid Valve: The tricuspid valve is normal in structure. Tricuspid valve regurgitation is trivial. Aortic Valve: The aortic valve is normal in structure. Aortic valve regurgitation is not visualized. Aortic valve mean gradient measures 3.0 mmHg. Aortic valve peak gradient measures 5.0 mmHg. Aortic valve area, by VTI measures 2.60 cm. Pulmonic Valve: The pulmonic valve was normal in structure. Pulmonic valve regurgitation is not visualized. Aorta: The ascending aorta was not well visualized. IAS/Shunts: No atrial level shunt detected by color flow Doppler.  LEFT VENTRICLE PLAX 2D LVIDd:         3.30 cm   Diastology LVIDs:         2.10 cm   LV e' medial:    4.57 cm/s LV PW:         1.20 cm   LV E/e' medial:  11.1 LV IVS:        0.90 cm   LV e' lateral:   6.31 cm/s LVOT diam:     2.00 cm   LV E/e' lateral: 8.0 LV SV:         48 LV SV Index:   26 LVOT Area:     3.14 cm  RIGHT VENTRICLE RV Basal diam:  3.10 cm RV S prime:     13.60 cm/s TAPSE (M-mode): 2.0 cm LEFT ATRIUM             Index        RIGHT ATRIUM           Index LA diam:        2.70 cm 1.47 cm/m   RA Area:     10.70 cm LA Vol (A2C):   31.3 ml 17.09 ml/m  RA Volume:    23.80 ml  13.00 ml/m LA Vol (A4C):   21.7 ml 11.85 ml/m LA Biplane Vol: 26.3 ml 14.36 ml/m  AORTIC VALVE                    PULMONIC VALVE AV Area (Vmax):    2.56 cm     RVOT Peak grad: 8 mmHg AV  Area (Vmean):   2.56 cm AV Area (VTI):     2.60 cm AV Vmax:           112.00 cm/s AV Vmean:          84.200 cm/s AV VTI:            0.186 m AV Peak Grad:      5.0 mmHg AV Mean Grad:      3.0 mmHg LVOT Vmax:         91.40 cm/s LVOT Vmean:        68.700 cm/s LVOT VTI:          0.154 m LVOT/AV VTI ratio: 0.83  AORTA Ao Root diam: 2.40 cm MITRAL VALVE                TRICUSPID VALVE MV Area (PHT): 3.91 cm     TR Peak grad:   11.3 mmHg MV Area VTI:   3.04 cm     TR Vmax:        168.00 cm/s MV Peak grad:  4.7 mmHg MV Mean grad:  2.0 mmHg     SHUNTS MV Vmax:       1.08 m/s     Systemic VTI:  0.15 m MV Vmean:      59.3 cm/s    Systemic Diam: 2.00 cm MV Decel Time: 194 msec     Pulmonic VTI:  0.236 m MV E velocity: 50.60 cm/s MV A velocity: 104.00 cm/s MV E/A ratio:  0.49 Dwayne D Callwood MD Electronically signed by Yolonda Kida MD Signature Date/Time: 01/25/2022/8:28:00 PM    Final    DG Hip Unilat W or Wo Pelvis 2-3 Views Left  Result Date: 01/25/2022 CLINICAL DATA:  fall EXAM: DG HIP (WITH OR WITHOUT PELVIS) 2-3V LEFT COMPARISON:  CT 06/12/2021 FINDINGS: There is no evidence of hip fracture or dislocation. Mild bilateral hip DJD. Iliofemoral arterial calcifications. Bilateral pelvic phleboliths. IMPRESSION: Mild bilateral hip DJD.  No acute findings. Electronically Signed   By: Lucrezia Europe M.D.   On: 01/25/2022 06:40    Microbiology: Results for orders placed or performed during the hospital encounter of 01/25/22  Resp Panel by RT-PCR (Flu A&B, Covid) Nasopharyngeal Swab     Status: None   Collection Time: 01/25/22  6:11 AM   Specimen: Nasopharyngeal Swab; Nasopharyngeal(NP) swabs in vial transport medium  Result Value Ref Range Status   SARS Coronavirus 2 by RT PCR NEGATIVE NEGATIVE Final    Comment:  (NOTE) SARS-CoV-2 target nucleic acids are NOT DETECTED.  The SARS-CoV-2 RNA is generally detectable in upper respiratory specimens during the acute phase of infection. The lowest concentration of SARS-CoV-2 viral copies this assay can detect is 138 copies/mL. A negative result does not preclude SARS-Cov-2 infection and should not be used as the sole basis for treatment or other patient management decisions. A negative result may occur with  improper specimen collection/handling, submission of specimen other than nasopharyngeal swab, presence of viral mutation(s) within the areas targeted by this assay, and inadequate number of viral copies(<138 copies/mL). A negative result must be combined with clinical observations, patient history, and epidemiological information. The expected result is Negative.  Fact Sheet for Patients:  EntrepreneurPulse.com.au  Fact Sheet for Healthcare Providers:  IncredibleEmployment.be  This test is no t yet approved or cleared by the [place] FDA and  has been authorized for detection and/or diagnosis of SARS-CoV-2 by FDA under an Emergency Use Authorization (EUA). This EUA will  remain  in effect (meaning this test can be used) for the duration of the COVID-19 declaration under Section 564(b)(1) of the Act, 21 U.S.C.section 360bbb-3(b)(1), unless the authorization is terminated  or revoked sooner.       Influenza A by PCR NEGATIVE NEGATIVE Final   Influenza B by PCR NEGATIVE NEGATIVE Final    Comment: (NOTE) The Xpert Xpress SARS-CoV-2/FLU/RSV plus assay is intended as an aid in the diagnosis of influenza from Nasopharyngeal swab specimens and should not be used as a sole basis for treatment. Nasal washings and aspirates are unacceptable for Xpert Xpress SARS-CoV-2/FLU/RSV testing.  Fact Sheet for Patients: EntrepreneurPulse.com.au  Fact Sheet for Healthcare  Providers: IncredibleEmployment.be  This test is not yet approved or cleared by the [place] FDA and has been authorized for detection and/or diagnosis of SARS-CoV-2 by FDA under an Emergency Use Authorization (EUA). This EUA will remain in effect (meaning this test can be used) for the duration of the COVID-19 declaration under Section 564(b)(1) of the Act, 21 U.S.C. section 360bbb-3(b)(1), unless the authorization is terminated or revoked.  Performed at [facility], [address] [place], [place] [address]   Urine Culture     Status: Abnormal   Collection Time: 01/25/22  7:37 AM   Specimen: Urine, Suprapubic  Result Value Ref Range Status   Specimen Description   Final    URINE, SUPRAPUBIC Performed at [facility], [address] [place], [place] [address]    Special Requests   Final    NONE Performed at [facility], [address] [place], [place] [address]    Culture MULTIPLE SPECIES PRESENT, SUGGEST RECOLLECTION (A)  Final   Report Status 01/26/2022 FINAL  Final    Labs: CBC: Recent Labs  Lab 02/02/22 0705  WBC 6.8  HGB 12.1  HCT 36.9  MCV 84.8  PLT 123456   Basic Metabolic Panel: Recent Labs  Lab 01/30/22 1508 01/31/22 1119 02/01/22 1227 02/01/22 1810 02/02/22 0705  NA 134* 133* 133* 135 134*  K 4.8 4.7 5.7* 4.2 5.2*  CL 102 103 102 103 102  CO2 25 23 23 26 23   GLUCOSE 312* 186* 244* 96 120*  BUN 47* 41* 34* 34* 37*  CREATININE 2.42* 2.28* 2.10* 2.04* 1.96*  CALCIUM 8.1* 7.9* 8.2* 8.5* 8.5*   Liver Function Tests: Recent Labs  Lab 02/02/22 0705  AST 36  ALT 15  ALKPHOS 118  BILITOT 0.6  PROT 7.9  ALBUMIN 2.8*   CBG: Recent Labs  Lab 02/01/22 1218 02/01/22 1710 02/01/22 1956 02/02/22 0730 02/02/22 1201  GLUCAP 246* 162* 119* 123* 174*    Discharge time spent: greater than 30 minutes.  Signed: Emeterio Reeve, DO [facility] 02/02/2022

## 2022-02-02 NOTE — Assessment & Plan Note (Signed)
Patient seems confused / uncertain about code status decision. Her husband notes she has had much more difficulty with memory problems, concentration since her strokes.  Per palliative care note, patient was initially in agreement for no CPR, patient had slightly different discussion with me later in the day, I spoke with her husband at length this morning regarding CODE STATUS, he is in agreement that given her multiple medical problems she is unlikely to recover from a serious cardiac/respiratory arrest with a quality of life that she would find meaningful/acceptable.  He states that since losing her independence from multiple strokes, she has certainly struggled with her limitations.  Given patient's limited capacity, I think CODE STATUS of DNR is appropriate per her husband who is legal surrogate decision maker.  Patient also has a daughter who is involved in her care, and was present for discussion with palliative care on 02/01/2022

## 2022-02-02 NOTE — Discharge Instructions (Addendum)
Inpatient Rehab Discharge Instructions  [name] Gilbert Discharge date and time: No discharge date for patient encounter.   Activities/Precautions/ Functional Status: Activity: Nonweightbearing right lower extremity Diet: diabetic diet Wound Care: Routine skin checks Functional status:  ___ No restrictions     ___ Walk up steps independently ___ 24/7 supervision/assistance   ___ Walk up steps with assistance ___ Intermittent supervision/assistance  ___ Bathe/dress independently ___ Walk with walker     __x_ Bathe/dress with assistance ___ Walk Independently    ___ Shower independently ___ Walk with assistance    ___ Shower with assistance ___ No alcohol     ___ Return to work/school ________  Special Instructions:  No driving smoking or alcohol  Continue aspirin 81 mg daily and Plavix 75 mg daily until May 06, 2022 then aspirin alone  Follow-up with primary care provider on blood pressure management   COMMUNITY REFERRALS UPON DISCHARGE:    Home Health:   PT   OT   SP                 Agency: [facility]    Phone: [phone]  Medical Equipment/Items Ordered: [facility]  [phone]                                                 [facility] AND TRANSFER BOARD-[employer]  [phone]    STROKE/TIA DISCHARGE INSTRUCTIONS SMOKING Cigarette smoking nearly doubles your risk of having a stroke & is the single most alterable risk factor  If you smoke or have smoked in the last 12 months, you are advised to quit smoking for your health. Most of the excess cardiovascular risk related to smoking disappears within a year of stopping. Ask you doctor about anti-smoking medications Index Quit Line: 1-800-QUIT NOW Free Smoking Cessation Classes [phone]  CHOLESTEROL Know your levels; limit fat & cholesterol in your diet  Lipid Panel     Component Value Date/Time   CHOL 310 (H) 01/26/2022 0656   TRIG 70 01/26/2022 0656   HDL 82 01/26/2022 0656    CHOLHDL 3.8 01/26/2022 0656   VLDL 14 01/26/2022 0656   LDLCALC 214 (H) 01/26/2022 0656     Many patients benefit from treatment even if their cholesterol is at goal. Goal: Total Cholesterol (CHOL) less than 160 Goal:  Triglycerides (TRIG) less than 150 Goal:  HDL greater than 40 Goal:  LDL (LDLCALC) less than 100   BLOOD PRESSURE American Stroke Association blood pressure target is less that 120/80 mm/Hg  Your discharge blood pressure is:    Monitor your blood pressure Limit your salt and alcohol intake Many individuals will require more than one medication for high blood pressure  DIABETES (A1c is a blood sugar average for last 3 months) Goal HGBA1c is under 7% (HBGA1c is blood sugar average for last 3 months)  Diabetes:   Lab Results  Component Value Date   HGBA1C 11.1 (H) 01/25/2022    Your HGBA1c can be lowered with medications, healthy diet, and exercise. Check your blood sugar as directed by your physician Call your physician if you experience unexplained or low blood sugars.  PHYSICAL ACTIVITY/REHABILITATION Goal is 30 minutes at least 4 days per week  Activity: Increase activity slowly, Therapies: Physical Therapy: Home Health Return to work:  Activity decreases your risk of heart attack and stroke and  makes your heart stronger.  It helps control your weight and blood pressure; helps you relax and can improve your mood. Participate in a regular exercise program. Talk with your doctor about the best form of exercise for you (dancing, walking, swimming, cycling).  DIET/WEIGHT Goal is to maintain a healthy weight  Your discharge diet is:  Diet Order             DIET DYS 3 Room service appropriate? Yes with Assist; Fluid consistency: Thin  Diet effective now                   liquids Your height is:    Your current weight is:   Your Body Mass Index (BMI) is:    Following the type of diet specifically designed for you will help prevent another stroke. Your goal  weight range is:   Your goal Body Mass Index (BMI) is 19-24. Healthy food habits can help reduce 3 risk factors for stroke:  High cholesterol, hypertension, and excess weight.  RESOURCES Stroke/Support Group:  Call [phone]   STROKE EDUCATION PROVIDED/REVIEWED AND GIVEN TO PATIENT Stroke warning signs and symptoms How to activate emergency medical system (call 911). Medications prescribed at discharge. Need for follow-up after discharge. Personal risk factors for stroke. Pneumonia vaccine given: No Flu vaccine given: No My questions have been answered, the writing is legible, and I understand these instructions.  I will adhere to these goals & educational materials that have been provided to me after my discharge from the hospital.      My questions have been answered and I understand these instructions. I will adhere to these goals and the provided educational materials after my discharge from the hospital.  Patient/Caregiver Signature _______________________________ Date __________  Clinician Signature _______________________________________ Date __________  Please bring this form and your medication list with you to all your follow-up doctor's appointments.

## 2022-02-02 NOTE — Progress Notes (Signed)
Inpatient Rehabilitation Admission Medication Review by a Pharmacist  A complete drug regimen review was completed for this patient to identify any potential clinically significant medication issues.  High Risk Drug Classes Is patient taking? Indication by Medication  Antipsychotic No   Anticoagulant Yes Lovenox- VTE prophylaxis  Antibiotic No   Opioid Yes Norco- acute pain  Antiplatelet Yes Aspirin, plavix- CVA prophylaxis  Hypoglycemics/insulin Yes Insulin- T2DM  Vasoactive Medication Yes HCTZ- hypertension  Chemotherapy No   Other Yes Keppra- seizure prophylaxis Lyrica- neuropathic pain Crestor- HLD     Type of Medication Issue Identified Description of Issue Recommendation(s)  Drug Interaction(s) (clinically significant)     Duplicate Therapy     Allergy     No Medication Administration End Date     Incorrect Dose     Additional Drug Therapy Needed     Significant med changes from prior encounter (inform family/care partners about these prior to discharge).    Other  PTA meds: Norvasc 10 mg daily Coreg 25 mg bid Hydralazine 50 mg daily Lisinopril 20 mg daily Aldactone 150 mg daily Per neurology- would restart PTA antihypertensive meds on 02/03/2022 one at a time with a few days between re-introductions. Recommends using PRN antihypertensive medications for BP >180/100    Clinically significant medication issues were identified that warrant physician communication and completion of prescribed/recommended actions by midnight of the next day:  No   Time spent performing this drug regimen review (minutes):  30   Kalea Perine BS, PharmD, BCPS Clinical Pharmacist 02/02/2022 1:52 PM  Contact: [phone] after 3 PM  "Be curious, not judgmental..." -Jamal Maes

## 2022-02-02 NOTE — Progress Notes (Addendum)
[name] Heys, MD  Physician Physical Medicine and Rehabilitation PMR Pre-admission     Signed Date of Service:  01/30/2022 12:40 PM  Related encounter: ED to Hosp-Admission (Discharged) from 01/25/2022 in [place]                                                                                                                                                                                                                                                                                                                                                                                                                                                                                                      PMR Admission Coordinator Pre-Admission Assessment   Patient: [name] Gilbert is an 61 y.o., female MRN: [id number] DOB: 08-06-1961 Height: _0  (157.5 cm) Weight: 74.2 kg   Insurance Information HMO: yes    PPO:      PCP:      IPA:      80/20:      OTHER:  PRIMARY: [employer]      Policy#: [id number]  Subscriber: patient CM Name: Edwena Felty      Phone#: [phone] ext [phone]     Fax#: [place] called with authorization 02/01/22 for 7 days.  Pre-Cert#: [id number]      Employer:  Benefits:  Phone #: n/a-[email or web address] at [email or web address]     Name:  Eff. Date: 09/11/21     Deduct: does not have deductible      Out of Pocket Max: $3,400 ($452.88 met)      Life Max: NA CIR: $295/day co-pay with a max co-pay of $1,770/admission      SNF: 100% coverage days 1-20, $196/day co-pay for days 21-100 Outpatient: $10-$20/visit co-pay     Co-Pay:  Home Health: 100% coverage      Co-Pay:  DME: 80% coverage     Co-Pay: 20% co-insurance Providers: in-network SECONDARY:       Policy#:       Phone#:    Development worker, community:       Phone#:    The Engineer, petroleum" for patients in Inpatient Rehabilitation Facilities with attached "Privacy Act [place] Records" was provided and verbally reviewed with: Patient   Emergency Contact Information Contact Information       Name Relation Home Work Mobile    Day,Robert Spouse [phone]               Current Medical History  Patient Admitting Diagnosis: CVA   History of Present Illness: Pt is a 61 year old female with medical hx significant for: DM1, CVA with left-sided deficits, HTN, CKD. Pt presented to [facility] on 01/25/[facility] after a fall. Pt reported worsening weakness on left side of body over previous 2 days. Also c/o left hip pain. Pt did not recall if she hit her head. CT head showed acute/subacute left temporal lobe infarct. 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 MRA showed left P1 occlusion. Orthopedics was consulted d/t pt c/o right ankle pain. X-rays revealed minimally displaced right distal fibular fx. No surgical intervention recommended. Pt was placed in posterior splint with sugar-tong supplement, maintaining ankle in neutral dorsiflexion. She is NWB on RLE. Repeat CT head on 01/27/22, d/t right UE increasing weakness, showed evolving recent infarcts of left temporal and occipital lobes and left thalamus with mild mass effect and no hemorrhage. Therapy evaluations completed and CIR recommended d/t pt's deficits in functional mobility and inability to complete ADLs independently.   Complete NIHSS TOTAL: 15   Patient's medical record from [facility] has been reviewed by the rehabilitation admission coordinator and physician.   Past Medical History      Past Medical History:  Diagnosis Date   Diabetes mellitus without complication ([place])     Hypertension     Stroke ([place])     visual impaired        Has the patient had major surgery during 100  days prior to admission? No   Family History   family history includes Diabetes in her father.   Current Medications   Current Facility-Administered Medications:    acetaminophen (TYLENOL) tablet 650 mg, 650 mg, Oral, Q4H PRN **OR** acetaminophen (TYLENOL) 160 MG/5ML solution 650 mg, 650 mg, Per Tube, Q4H PRN **OR** acetaminophen (TYLENOL) suppository 650 mg, 650 mg, Rectal, Q4H PRN, [name], MD   aspirin chewable tablet 81 mg, 81 mg, Oral, q morning, [name], MD, 81 mg at 02/02/22 0912   calcium gluconate 1 g/ 50 mL sodium chloride IVPB, 1 g, Intravenous, Once, Emeterio Reeve,  DO   [COMPLETED] clopidogrel (PLAVIX) tablet 300 mg, 300 mg, Oral, Once, 300 mg at 01/25/22 2121 **AND** clopidogrel (PLAVIX) tablet 75 mg, 75 mg, Oral, Daily, [name], MD, 75 mg at 02/02/22 0912   dextrose 50 % solution 50 mL, 1 ampule, Intravenous, Once, Emeterio Reeve, DO   enoxaparin (LOVENOX) injection 30 mg, 30 mg, Subcutaneous, Q24H, Dorothe Pea, RPH, 30 mg at 02/01/22 2214   hydrochlorothiazide (HYDRODIURIL) tablet 25 mg, 25 mg, Oral, Daily, Emeterio Reeve, DO, 25 mg at 02/02/22 0912   HYDROcodone-acetaminophen ([place]) 7.5-325 MG per tablet 1-2 tablet, 1-2 tablet, Oral, Q6H PRN, [name], MD, 2 tablet at 01/30/22 2003   insulin aspart (novoLOG) injection 0-5 Units, 0-5 Units, Subcutaneous, QHS, [name], MD, 4 Units at 01/28/22 2041   insulin aspart (novoLOG) injection 0-9 Units, 0-9 Units, Subcutaneous, TID WC, [name], MD, 1 Units at 02/02/22 0916   insulin glargine-yfgn (SEMGLEE) injection 18 Units, 18 Units, Subcutaneous, Daily, [name], MD, 18 Units at 02/02/22 0917   levETIRAcetam (KEPPRA) tablet 250 mg, 250 mg, Oral, BID, [name], MD, 250 mg at 02/02/22 0912   ondansetron (ZOFRAN) injection 4 mg, 4 mg, Intravenous, Q8H PRN, [name], MD   pregabalin (LYRICA) capsule 75 mg, 75 mg, Oral, BID, [name], MD, 75 mg at 02/02/22 0912   rosuvastatin  (CRESTOR) tablet 40 mg, 40 mg, Oral, QHS, [name], MD, 40 mg at 02/01/22 2214   senna-docusate (Senokot-S) tablet 1 tablet, 1 tablet, Oral, QHS PRN, [name], MD, 1 tablet at 01/26/22 2114   Patients Current Diet:  Diet Order                  DIET DYS 3 Room service appropriate? Yes with Assist; Fluid consistency: Thin  Diet effective now                         Precautions / Restrictions Precautions Precautions: Fall Restrictions Weight Bearing Restrictions: Yes RLE Weight Bearing: Non weight bearing    Has the patient had 2 or more falls or a fall with injury in the past year? Yes   Prior Activity Level Limited Community (1-2x/wk): gets out of house 2-3 days/week   Prior Functional Level Self Care: Did the patient need help bathing, dressing, using the toilet or eating? Independent   Indoor Mobility: Did the patient need assistance with walking from room to room (with or without device)? Independent   Stairs: Did the patient need assistance with internal or external stairs (with or without device)? Needed some help   Functional Cognition: Did the patient need help planning regular tasks such as shopping or remembering to take medications? Independent   Patient Information Are you of Hispanic, Latino/a,or Spanish origin?: A. No, not of Hispanic, Latino/a, or Spanish origin What is your race?: B. Black or African American Do you need or want an interpreter to communicate with a doctor or health care staff?: No, speaks English   Patient's Response To:  Health Literacy and Transportation Is the patient able to respond to health literacy and transportation needs?: No Health Literacy - How often do you need to have someone help you when you read instructions, pamphlets, or other written material from your doctor or pharmacy?: Never In the past 12 months, has lack of transportation kept you from medical appointments or from getting medications?: No In the past 12  months, has lack of transportation kept you from meetings, work, or  from getting things needed for daily living?: No   Home Assistive Devices / [place] Devices/Equipment: None Home Equipment: Conservation officer, nature (2 wheels)   Prior Device Use: Indicate devices/aids used by the patient prior to current illness, exacerbation or injury? Gilford Rile (daughter reports pt didn't use it)   Current Functional Level Cognition   Arousal/Alertness: Awake/alert Overall Cognitive Status: Impaired/Different from baseline Current Attention Level: Focused Orientation Level: Disoriented to time, Oriented to person, Oriented to place, Oriented to situation Following Commands: Follows one step commands inconsistently Safety/Judgement: Decreased awareness of safety, Decreased awareness of deficits General Comments: difficulty sequencing transfer, requires multimodal cues for safety. Attention: Focused Focused Attention: Impaired Memory: Appears intact Awareness: Appears intact    Extremity Assessment (includes Sensation/Coordination)   Upper Extremity Assessment: RUE deficits/detail, LUE deficits/detail RUE Deficits / Details: AROM: shoulder flexion: unable; trace movement noted; elbow flexion: 1/2 full AROM, 1+ MAS in elbow flexion/extension, wrist flexion/extension: 3/4 full AROM; weak grip strength RUE Sensation: decreased light touch, decreased proprioception RUE Coordination: decreased fine motor, decreased gross motor LUE Deficits / Details: AROM shoulder flexion: pt able to bring to 90 degrees without tactile cueing, to full AROM in shoudler flexion with tactile cues, elbow/wrist appear WFL; midly impaired grip strength LUE Coordination: decreased fine motor  Lower Extremity Assessment: Defer to PT evaluation, RLE deficits/detail RLE Deficits / Details: pt is NWBing to RLE RLE: Unable to fully assess due to pain RLE Sensation:  (she is able to detct light touch but reports RLE sensation  feels diminished compared to left) LLE Deficits / Details: WNL LLE Sensation: WNL     ADLs   Overall ADL's : Needs assistance/impaired Grooming: Wash/dry face, Maximal assistance, Cueing for sequencing Lower Body Dressing: Maximal assistance, Cueing for sequencing Toilet Transfer: Maximal assistance, Squat-pivot, +2 for physical assistance Toilet Transfer Details (indicate cue type and reason): simulated to bedside chair step by step vcs, pt attempted to assist with LUE Toileting- Clothing Manipulation and Hygiene: Total assistance General ADL Comments: MIN A self-feeding seated EOB - cues for aspiration pcns and multimodal cues to use RUE as gross stabilizer. Initiated OOB t/f however pt demonstrated increased difficulty following multistep commands to maintain RLE NWBing and sequence t/f  - ultimately required +2 assist for safe lateral scoots with TOTAL LOB. MIN A for R rolling, MOD A for L rolling to complete bed levle pericare. MIN A don/doff gown at bed level - assist to thread over R shoulder     Mobility   Overal bed mobility: Needs Assistance Bed Mobility: Rolling, Supine to Sit, Sit to Supine Rolling: Min assist, Mod assist Supine to sit: Mod assist Sit to supine: Max assist General bed mobility comments: assistance for BLE and trunk support. verbal cues for sequencing and technique. increased time and effort required     Transfers   Overall transfer level: Needs assistance Equipment used: Rolling walker (2 wheels) Transfers: Bed to chair/wheelchair/BSC Sit to Stand: Max assist, +2 safety/equipment, +2 physical assistance Bed to/from chair/wheelchair/BSC transfer type:: Lateral/scoot transfer  Lateral/Scoot Transfers: Max assist, +2 physical assistance, Total assist General transfer comment: MAX initial scoot decreasing to TOTAL x2 for safe completion     Ambulation / Gait / Stairs / Wheelchair Mobility   Ambulation/Gait Ambulation/Gait assistance: Herbalist  (Feet): 2 Feet Assistive device: Rolling walker (2 wheels) Gait Pattern/deviations: Step-to pattern, Decreased stride length, Decreased stance time - right General Gait Details: decreased weight acceptance on RLE. patient complains of some pain in the right  ankle. difficulty with advancement of RLE and unable to take a step backward. further standing activity limited by fatigue Gait velocity: decreased     Posture / Balance Dynamic Sitting Balance Sitting balance - Comments: patient able to maintain midline without physical assistance. supervision to stand by assistance provided for safety Balance Overall balance assessment: Needs assistance Sitting-balance support: Feet supported, No upper extremity supported Sitting balance-Leahy Scale: Fair Sitting balance - Comments: patient able to maintain midline without physical assistance. supervision to stand by assistance provided for safety Postural control: Posterior lean Standing balance support: During functional activity Standing balance-Leahy Scale: Zero Standing balance comment: Min A to Min guard for safety     Special needs/care consideration Diabetic management novoLOG 0-5 units daily at bedtime; novoLOG 0-9 units 3x daily with meals; Semglee 15 units daily Bladder incontinence, and External urinary catheter    Previous Home Environment (from acute therapy documentation) Living Arrangements: Spouse/significant other  Lives With: Spouse Available Help at Discharge: Family, Available 24 hours/day Type of Home: House Home Layout: One level Home Access: Stairs to enter Entrance Stairs-Rails: None Entrance Stairs-Number of Steps: 3 Bathroom Shower/Tub: Chiropodist: Standard Bathroom Accessibility: Yes How Accessible: Accessible via walker [place]: No   Discharge Living Setting Plans for Discharge Living Setting: Patient's home Type of Home at Discharge: House Discharge Home Layout: One  level Discharge Home Access: Stairs to enter Entrance Stairs-Rails: None Entrance Stairs-Number of Steps: 3 Discharge Bathroom Shower/Tub: Tub/shower unit Discharge Bathroom Toilet: Standard Discharge Bathroom Accessibility: Yes How Accessible: Accessible via walker Does the patient have any problems obtaining your medications?: No   Social/Family/Support Systems Patient Roles: Spouse Anticipated Caregiver: Herbie [place] Day, husband and Dela (daughter) Anticipated Caregiver's Contact Information: Herbie [place]: [phone], Dela: [phone] Caregiver Availability: 24/7 Discharge Plan Discussed with Primary Caregiver: Yes Is Caregiver In Agreement with Plan?: Yes Does Caregiver/Family have Issues with Lodging/Transportation while Pt is in Rehab?: No   Goals Patient/Family Goal for Rehab: Supervision-Min A: PT/OT, Supervision: ST Expected length of stay: 12-14 days Pt/Family Agrees to Admission and willing to participate: Yes Program Orientation Provided & Reviewed with Pt/Caregiver Including Roles  & Responsibilities: Yes   Decrease burden of Care through IP rehab admission: NA   Possible need for SNF placement upon discharge: Not anticipated   Patient Condition: I have reviewed medical records from [facility], spoken with CM, and spouse and daughter. I discussed via phone for inpatient rehabilitation assessment.  Patient will benefit from ongoing PT, OT, and SLP, can actively participate in 3 hours of therapy a day 5 days of the week, and can make measurable gains during the admission.  Patient will also benefit from the coordinated team approach during an Inpatient Acute Rehabilitation admission.  The patient will receive intensive therapy as well as Rehabilitation physician, nursing, social worker, and care management interventions.  Due to bladder management, safety, disease management, medication administration, pain management, and patient education the patient requires  24 hour a day rehabilitation nursing.  The patient is currently mod to max assist with mobility and basic ADLs.  Discharge setting and therapy post discharge at home with home health is anticipated.  Patient has agreed to participate in the Acute Inpatient Rehabilitation Program and will admit today.   Preadmission Screen Completed By:  Bethel Born, 02/02/2022 11:25 AM and updated by Karene Fry, RN ______________________________________________________________________   Discussed status with Dr. Dagoberto Ligas on 02/02/22 at 11:23 am and received approval for admission today.   Admission  Coordinator:  Bethel Born, CCC-SLP and updated by Karene Fry, RN, time 11:26 am/Date 02/02/22    Assessment/Plan: Diagnosis: Does the need for close, 24 hr/day Medical supervision in concert with the patient's rehab needs make it unreasonable for this patient to be served in a less intensive setting? Yes Co-Morbidities requiring supervision/potential complications: L watershed infarct; R distal fibular fx NWB- non-op; CKD- HTN; visual impairment Due to bladder management, bowel management, safety, skin/wound care, disease management, medication administration, pain management, and patient education, does the patient require 24 hr/day rehab nursing? Yes Does the patient require coordinated care of a physician, rehab nurse, PT, OT, and SLP to address physical and functional deficits in the context of the above medical diagnosis(es)? Yes Addressing deficits in the following areas: balance, endurance, locomotion, strength, transferring, bowel/bladder control, bathing, dressing, feeding, grooming, toileting, cognition, speech, language, and swallowing Can the patient actively participate in an intensive therapy program of at least 3 hrs of therapy 5 days a week? Yes The potential for patient to make measurable gains while on inpatient rehab is good Anticipated functional outcomes upon discharge from  inpatient rehab: min assist PT, min assist OT, supervision SLP Estimated rehab length of stay to reach the above functional goals is: 12-16 days Anticipated discharge destination: Home 10. Overall Rehab/Functional Prognosis: good     MD Signature:           Revision History                                                        Note Details  Jan Fireman, MD File Time 02/02/2022 12:08 PM  Author Type Physician Status Signed  Last Editor [name] Heys, MD Service Physical Medicine and [place] # [id number] Admit Date 02/02/2022

## 2022-02-02 NOTE — TOC Progression Note (Signed)
Transition of Care [facility]) - Progression Note    Patient Details  Name: [name] Gilbert MRN: [id number] Date of Birth: 12/10/1960  Transition of Care [facility]) CM/SW Contact  Caryn Section, RN Phone Number: 02/02/2022, 1:04 PM  Clinical Narrative:  [place] transporting to CIR today via [facility]            Expected Discharge Plan and Services           Expected Discharge Date: 02/02/22                                     Social Determinants of Health (SDOH) Interventions    Readmission Risk Interventions     View : No data to display.

## 2022-02-02 NOTE — H&P (Signed)
Physical Medicine and Rehabilitation Admission H&P        Chief Complaint  Patient presents with   Fall  : HPI: [name] Gilbert is a 61 year old right-handed female with history of hypertension, , CVA with left-sided residual weakness as well as aphasia maintained on low-dose aspirin, diabetes mellitus, diastolic congestive heart failure, CKD stage III.  Per chart review patient lives with spouse.  1 level home 2 steps to entry.  Modified independent prior to admission.  Presented to [facility] 01/25/2022 with aphasia with increasing  weakness and recent fall with complaints of right ankle pain.  Noted blood pressure 204/98.  Cranial CT scan showed relatively large acute to subacute infarct in the posterior and mesial left temporal lobe new since February.  No hemorrhagic transformation or significant mass effect.  CT cervical spine negative.  MRI/MRA confluent acute infarct in the medial left temporal lobe with additional punctate infarct in the left thalamus.  Associated edema without significant mass effect.  Remote infarct and moderate chronic microvascular disease.  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  Similar severe distal M1/proximal M2 MCA stenosis bilaterally.  Patient did not receive tPA.  Admission chemistries unremarkable except sodium 132 glucose 261 BUN 27 creatinine 1.71, troponin 62-50-44, BNP 80.8, urinalysis negative nitrite, urine drug screen negative.  Echocardiogram with ejection fraction of 60 to 65% grade 1 diastolic dysfunction.  Elevated troponin felt to be related to demand ischemia.  Presently maintained on aspirin 81 mg daily and Plavix 75 mg daily for CVA prophylaxis for 90 days.  Lovenox added for DVT prophylaxis.  Maintained on Keppra for seizure prophylaxis.  X-rays of right ankle revealed a minimally displaced right distal fibular fracture with orthopedic services consulted Dr. Leron Croak with no surgical intervention placed in a posterior splint with a  sugar-tong supplement and nonweightbearing.  Tolerating mechanical soft diet.  Monitoring of AKI on CKD latest creatinine 2.04.  Episode of hyperkalemia 02/01/2022 treated.  Palliative care consulted to establish goals of care.  Therapy evaluations completed due to patient decreased functional mobility and speech difficulties was admitted for a comprehensive rehab program.     Pt reports weakness from prior stroke was L sided but has new R sided weakness.  R ankle pain severe 9/10- hasn't had pain meds lately- asked nurse to bring some.  LBM 3-4 days ago- not eating well, so denies constipation.  Using purewick to void.      Review of Systems  Constitutional:  Negative for chills and fever.  HENT:  Negative for hearing loss.   Eyes:  Positive for blurred vision.  Respiratory:  Negative for cough and shortness of breath.   Cardiovascular:  Positive for leg swelling. Negative for chest pain and palpitations.  Gastrointestinal:  Positive for constipation. Negative for heartburn, nausea and vomiting.  Genitourinary:  Negative for dysuria, flank pain and hematuria.  Musculoskeletal:  Positive for falls and myalgias.  Skin:  Negative for rash.  Neurological:  Positive for speech change and weakness.  All other systems reviewed and are negative.     Past Medical History:  Diagnosis Date   Diabetes mellitus without complication (HCC)     Hypertension     Stroke (HCC)     visual impaired           Past Surgical History:  Procedure Laterality Date   ABDOMINAL HYSTERECTOMY       TONSILLECTOMY             Family History  Problem Relation Age of Onset   Diabetes Father      Social History:  reports that she has never smoked. She has never used smokeless tobacco. She reports that she does not drink alcohol and does not use drugs. Allergies:       Allergies  Allergen Reactions   Metformin Other (See Comments)      Reaction: unknown   Penicillins Hives, Nausea And Vomiting and Swelling       Has patient had a PCN reaction causing immediate rash, facial/tongue/throat swelling, SOB or lightheadedness with hypotension: Yes Has patient had a PCN reaction causing severe rash involving mucus membranes or skin necrosis: No Has patient had a PCN reaction that required hospitalization Yes Has patient had a PCN reaction occurring within the last 10 years: No If all of the above answers are "NO", then may proceed with Cephalosporin use.     Codeine Other (See Comments)      Reaction: unknown   Hydralazine        Stomach pain   Statins Other (See Comments)      Myopathy with atorva 80          Medications Prior to Admission  Medication Sig Dispense Refill   acetaminophen (TYLENOL) 500 MG tablet Take 1,000 mg by mouth daily as needed for pain.       amLODipine (NORVASC) 10 MG tablet Take 10 mg by mouth daily.       carvedilol (COREG) 12.5 MG tablet Take 25 mg by mouth 2 (two) times daily.       hydrALAZINE (APRESOLINE) 25 MG tablet Take 25 mg by mouth 3 (three) times daily.       hydrochlorothiazide (HYDRODIURIL) 50 MG tablet Take 50 mg by mouth daily.       insulin aspart (NOVOLOG FLEXPEN) 100 UNIT/ML FlexPen Inject 2 Units into the skin 3 (three) times daily with meals. 15 mL 11   insulin degludec (TRESIBA FLEXTOUCH) 100 UNIT/ML SOPN FlexTouch Pen Inject 30 Units into the skin daily.       lisinopril (ZESTRIL) 20 MG tablet Take 20 mg by mouth daily.       rosuvastatin (CRESTOR) 20 MG tablet Take 20 mg by mouth at bedtime.       spironolactone (ALDACTONE) 50 MG tablet Take 150 mg by mouth daily.       aspirin 81 MG chewable tablet Chew 81 mg by mouth every morning. (Patient not taking: Reported on 01/26/2022)       pregabalin (LYRICA) 75 MG capsule Take 75 mg by mouth 2 (two) times daily. (Patient not taking: Reported on 01/26/2022)              Home: Home Living Family/patient expects to be discharged to:: Private residence Living Arrangements: Spouse/significant  other Available Help at Discharge: Family, Available 24 hours/day Type of Home: House Home Access: Stairs to enter [employer] of Steps: 3 Entrance Stairs-Rails: None Home Layout: One level Bathroom Shower/Tub: Engineer, manufacturing systems: Standard Bathroom Accessibility: Yes Home Equipment: Agricultural consultant (2 wheels)  Lives With: Spouse   Functional History: Prior Function Prior Level of Function : Independent/Modified Independent   Functional Status:  Mobility: Bed Mobility Overal bed mobility: Needs Assistance Bed Mobility: Rolling, Supine to Sit, Sit to Supine Rolling: Min assist, Mod assist Supine to sit: Mod assist Sit to supine: Max assist General bed mobility comments: assistance for BLE and trunk support. verbal cues for sequencing and technique. increased time and effort required Transfers  Overall transfer level: Needs assistance Equipment used: Rolling walker (2 wheels) Transfers: Bed to chair/wheelchair/BSC Sit to Stand: Max assist, +2 safety/equipment, +2 physical assistance Bed to/from chair/wheelchair/BSC transfer type:: Lateral/scoot transfer  Lateral/Scoot Transfers: Max assist, +2 physical assistance, Total assist General transfer comment: MAX initial scoot decreasing to TOTAL x2 for safe completion Ambulation/Gait Ambulation/Gait assistance: Min assist Gait Distance (Feet): 2 Feet Assistive device: Rolling walker (2 wheels) Gait Pattern/deviations: Step-to pattern, Decreased stride length, Decreased stance time - right General Gait Details: decreased weight acceptance on RLE. patient complains of some pain in the right ankle. difficulty with advancement of RLE and unable to take a step backward. further standing activity limited by fatigue Gait velocity: decreased   ADL: ADL Overall ADL's : Needs assistance/impaired Grooming: Wash/dry face, Maximal assistance, Cueing for sequencing Lower Body Dressing: Maximal assistance, Cueing for  sequencing Toilet Transfer: Maximal assistance, Squat-pivot, +2 for physical assistance Toilet Transfer Details (indicate cue type and reason): simulated to bedside chair step by step vcs, pt attempted to assist with LUE Toileting- Clothing Manipulation and Hygiene: Total assistance General ADL Comments: MIN A self-feeding seated EOB - cues for aspiration pcns and multimodal cues to use RUE as gross stabilizer. Initiated OOB t/f however pt demonstrated increased difficulty following multistep commands to maintain RLE NWBing and sequence t/f  - ultimately required +2 assist for safe lateral scoots with TOTAL LOB. MIN A for R rolling, MOD A for L rolling to complete bed levle pericare. MIN A don/doff gown at bed level - assist to thread over R shoulder   Cognition: Cognition Overall Cognitive Status: Impaired/Different from baseline Arousal/Alertness: Awake/alert Orientation Level: Disoriented to time, Oriented to person, Oriented to place, Oriented to situation Attention: Focused Focused Attention: Impaired Memory: Appears intact Awareness: Appears intact Cognition Arousal/Alertness: Awake/alert Behavior During Therapy: Flat affect Overall Cognitive Status: Impaired/Different from baseline Area of Impairment: Safety/judgement, Following commands, Memory Current Attention Level: Focused Memory: Decreased recall of precautions, Decreased short-term memory Following Commands: Follows one step commands inconsistently Safety/Judgement: Decreased awareness of safety, Decreased awareness of deficits Awareness: Intellectual Problem Solving: Slow processing, Decreased initiation, Difficulty sequencing, Requires verbal cues, Requires tactile cues General Comments: difficulty sequencing transfer, requires multimodal cues for safety.   Physical Exam: Blood pressure (!) 159/78, pulse 71, temperature 98.3 F (36.8 C), resp. rate 17, height 5\' 2"  (1.575 m), weight 74.2 kg, SpO2 100 %. Physical  Exam Vitals and nursing note reviewed. Exam conducted with a chaperone present.  Constitutional:      General: She is not in acute distress.    Comments: Pt sitting up in bed- very hard to hear, speaking so quietly- extremely flat affect; NAD  HENT:     Head: Normocephalic.     Comments: Says is smiling- don't see a difference- maybe B/L facial droop? Unable to stick tongue out    Right Ear: External ear normal.     Left Ear: External ear normal.     Nose: Nose normal.     Mouth/Throat:     Mouth: Mucous membranes are dry.     Pharynx: Oropharynx is clear. No oropharyngeal exudate.  Eyes:     General:        Right eye: No discharge.        Left eye: No discharge.     Comments: R gaze preference- unable to look left past midline- no nystagmus seen  Cardiovascular:     Rate and Rhythm: Normal rate and regular rhythm.     Heart sounds:  Normal heart sounds. No murmur heard.   No gallop.  Pulmonary:     Effort: Pulmonary effort is normal. No respiratory distress.     Breath sounds: Normal breath sounds. No wheezing, rhonchi or rales.  Abdominal:     Comments: Soft, NT, ND; very hypoactive BS  Musculoskeletal:        General: Swelling present.     Cervical back: Neck supple. No tenderness.     Comments: RUE 3-/5 in biceps, triceps, grip and 1/5 in FA LUE- Biceps 2/5; triceps 3-/5; grip 3-/5 and FA 3-/5 RLE- HF 2-/5; KE 1/5; cannot wiggle toes below cast with ACE wrap LLE- HF 2-/5; KE 2-/5; DF 0/5 and PF 2+/5   However spontaneous movement in R side and L side better than tested when reached for remote to use it  Skin:    Comments: RLE cast from toes to just below knee with half cast; half ACE wrap No skin breakdown seen  Neurological:     Mental Status: She is alert.     Comments: Patient is alert.  No acute distress.  She does display mild to moderate expressive receptive aphasia. Moderate aphasia noted Decreased to light touch per pt on B/L sides- no difference between both  sides  Psychiatric:     Comments: So extremely flat affect and facies      Lab Results Last 48 Hours        Results for orders placed or performed during the hospital encounter of 01/25/22 (from the past 48 hour(s))  Basic metabolic panel     Status: Abnormal    Collection Time: 01/31/22 11:19 AM  Result Value Ref Range    Sodium 133 (L) 135 - 145 mmol/L    Potassium 4.7 3.5 - 5.1 mmol/L    Chloride 103 98 - 111 mmol/L    CO2 23 22 - 32 mmol/L    Glucose, Bld 186 (H) 70 - 99 mg/dL      Comment: Glucose reference range applies only to samples taken after fasting for at least 8 hours.    BUN 41 (H) 8 - 23 mg/dL    Creatinine, Ser 2.28 (H) 0.44 - 1.00 mg/dL    Calcium 7.9 (L) 8.9 - 10.3 mg/dL    GFR, Estimated 24 (L) >60 mL/min      Comment: (NOTE) Calculated using the CKD-EPI Creatinine Equation (2021)      Anion gap 7 5 - 15      Comment: Performed at [facility], [address] [place], [place] [address]  Glucose, capillary     Status: Abnormal    Collection Time: 01/31/22 11:50 AM  Result Value Ref Range    Glucose-Capillary 192 (H) 70 - 99 mg/dL      Comment: Glucose reference range applies only to samples taken after fasting for at least 8 hours.  Glucose, capillary     Status: Abnormal    Collection Time: 01/31/22  4:24 PM  Result Value Ref Range    Glucose-Capillary 236 (H) 70 - 99 mg/dL      Comment: Glucose reference range applies only to samples taken after fasting for at least 8 hours.  Glucose, capillary     Status: Abnormal    Collection Time: 01/31/22 10:31 PM  Result Value Ref Range    Glucose-Capillary 179 (H) 70 - 99 mg/dL      Comment: Glucose reference range applies only to samples taken after fasting for at least 8 hours.  Glucose, capillary     Status: None    Collection Time: 02/01/22  7:42 AM  Result Value Ref Range    Glucose-Capillary 88 70 - 99 mg/dL      Comment: Glucose reference range applies only to samples taken after fasting for  at least 8 hours.  Glucose, capillary     Status: Abnormal    Collection Time: 02/01/22 12:18 PM  Result Value Ref Range    Glucose-Capillary 246 (H) 70 - 99 mg/dL      Comment: Glucose reference range applies only to samples taken after fasting for at least 8 hours.  Basic metabolic panel     Status: Abnormal    Collection Time: 02/01/22 12:27 PM  Result Value Ref Range    Sodium 133 (L) 135 - 145 mmol/L    Potassium 5.7 (H) 3.5 - 5.1 mmol/L    Chloride 102 98 - 111 mmol/L    CO2 23 22 - 32 mmol/L    Glucose, Bld 244 (H) 70 - 99 mg/dL      Comment: Glucose reference range applies only to samples taken after fasting for at least 8 hours.    BUN 34 (H) 8 - 23 mg/dL    Creatinine, Ser 2.10 (H) 0.44 - 1.00 mg/dL    Calcium 8.2 (L) 8.9 - 10.3 mg/dL    GFR, Estimated 26 (L) >60 mL/min      Comment: (NOTE) Calculated using the CKD-EPI Creatinine Equation (2021)      Anion gap 8 5 - 15      Comment: Performed at [facility], [address] [place], [place] [address]  Glucose, capillary     Status: Abnormal    Collection Time: 02/01/22  5:10 PM  Result Value Ref Range    Glucose-Capillary 162 (H) 70 - 99 mg/dL      Comment: Glucose reference range applies only to samples taken after fasting for at least 8 hours.  Basic metabolic panel     Status: Abnormal    Collection Time: 02/01/22  6:10 PM  Result Value Ref Range    Sodium 135 135 - 145 mmol/L    Potassium 4.2 3.5 - 5.1 mmol/L    Chloride 103 98 - 111 mmol/L    CO2 26 22 - 32 mmol/L    Glucose, Bld 96 70 - 99 mg/dL      Comment: Glucose reference range applies only to samples taken after fasting for at least 8 hours.    BUN 34 (H) 8 - 23 mg/dL    Creatinine, Ser 2.04 (H) 0.44 - 1.00 mg/dL    Calcium 8.5 (L) 8.9 - 10.3 mg/dL    GFR, Estimated 27 (L) >60 mL/min      Comment: (NOTE) Calculated using the CKD-EPI Creatinine Equation (2021)      Anion gap 6 5 - 15      Comment: Performed at [facility], [address] [place], [place] [address]  Glucose, capillary     Status: Abnormal    Collection Time: 02/01/22  7:56 PM  Result Value Ref Range    Glucose-Capillary 119 (H) 70 - 99 mg/dL      Comment: Glucose reference range applies only to samples taken after fasting for at least 8 hours.  CBC     Status: None    Collection Time: 02/02/22  7:05 AM  Result Value Ref Range    WBC 6.8 4.0 - 10.5 K/uL    RBC  4.35 3.87 - 5.11 MIL/uL    Hemoglobin 12.1 12.0 - 15.0 g/dL    HCT 93.2 35.5 - 73.2 %    MCV 84.8 80.0 - 100.0 fL    MCH 27.8 26.0 - 34.0 pg    MCHC 32.8 30.0 - 36.0 g/dL    RDW 20.2 54.2 - 70.6 %    Platelets 280 150 - 400 K/uL    nRBC 0.0 0.0 - 0.2 %      Comment: Performed at [facility], [address] [place], [place] [address]  Comprehensive metabolic panel     Status: Abnormal    Collection Time: 02/02/22  7:05 AM  Result Value Ref Range    Sodium 134 (L) 135 - 145 mmol/L    Potassium 5.2 (H) 3.5 - 5.1 mmol/L    Chloride 102 98 - 111 mmol/L    CO2 23 22 - 32 mmol/L    Glucose, Bld 120 (H) 70 - 99 mg/dL      Comment: Glucose reference range applies only to samples taken after fasting for at least 8 hours.    BUN 37 (H) 8 - 23 mg/dL    Creatinine, Ser 8.31 (H) 0.44 - 1.00 mg/dL    Calcium 8.5 (L) 8.9 - 10.3 mg/dL    Total Protein 7.9 6.5 - 8.1 g/dL    Albumin 2.8 (L) 3.5 - 5.0 g/dL    AST 36 15 - 41 U/L    ALT 15 0 - 44 U/L    Alkaline Phosphatase 118 38 - 126 U/L    Total Bilirubin 0.6 0.3 - 1.2 mg/dL    GFR, Estimated 28 (L) >60 mL/min      Comment: (NOTE) Calculated using the CKD-EPI Creatinine Equation (2021)      Anion gap 9 5 - 15      Comment: Performed at [facility], [address] [place], [place] [address]  Glucose, capillary     Status: Abnormal    Collection Time: 02/02/22  7:30 AM  Result Value Ref Range    Glucose-Capillary 123 (H) 70 - 99 mg/dL      Comment: Glucose reference range applies only to samples taken after fasting  for at least 8 hours.      Imaging Results (Last 48 hours)  No results found.         Blood pressure (!) 159/78, pulse 71, temperature 98.3 F (36.8 C), resp. rate 17, height 5\' 2"  (1.575 m), weight 74.2 kg, SpO2 100 %.   Medical Problem List and Plan: 1. Functional deficits secondary to large acute to subacute infarct posterior medial left temporal and occipital lobe and left thalamus in the setting of multiple intracranial stenosis as well as history of CVA with left-sided weakness/aphasia             -patient may  shower- bag RLE             -ELOS/Goals: 14-16 days- min A to supervision 2.  Antithrombotics: -DVT/anticoagulation:  Pharmaceutical: Lovenox             -antiplatelet therapy: Aspirin 81 mg daily and Plavix 75 mg daily x90 days then aspirin alone 3. Pain Management: Lyrica 75 mg twice daily, hydrocodone as needed 4. Mood: Provide emotional support             -antipsychotic agents: N/A 5. Neuropsych: This patient is?capable of making decisions on her own behalf. 6. Skin/Wound Care: Routine skin checks 7. Fluids/Electrolytes/Nutrition: Routine in  and outs with follow-up chemistries 8.  Right distal fibular metaphysis fracture with minimal lateral displacement.  Nonsurgical.  Follow-up orthopedic services Dr.Poggi.  Nonweightbearing right lower extremity 9.  Diabetes mellitus.  Hemoglobin A1c 11.1.  Semglee 18 units daily.  Check blood sugars before meals and at bedtime 10.  Seizure prophylaxis.  Keppra 250 mg twice daily. 11.  Permissive hypertension.  Presently on HCTZ 25 mg daily.  Patient on amlodipine 10 mg daily, hydralazine 25 mg 3 times daily, Coreg 25 mg twice daily, Aldactone 150 mg daily PTA.  Resume as needed 12.  Diastolic congestive heart failure.  Monitor for any signs of fluid overload 13.  AKI on CKD stage III.  Creatinine baseline 1.4-1.7.  Follow-up chemistries 14.  Hyperlipidemia.  Crestor 15. Constipation- LBM 3-4 days ago- might need additional  intervention- hasn't been eating much per pt/nursing.    I have personally performed a face to face diagnostic evaluation of this patient and formulated the key components of the plan.  Additionally, I have personally reviewed laboratory data, imaging studies, as well as relevant notes and concur with the physician assistant's documentation above.   The patient's status has not changed from the original H&P.  Any changes in documentation from the acute care chart have been noted above.       Lavon Paganini Angiulli, PA-C 02/02/2022

## 2022-02-02 NOTE — Progress Notes (Signed)
Daughter Samson Frederic. [phone]

## 2022-02-02 NOTE — Progress Notes (Signed)
[facility] IP rehab admissions - I do have approval from insurance carrier for acute inpatient rehab admission.  Awaiting response from attending regarding medical readiness for inpatient rehab admission.  We do have available beds on CIR today.  Call me for questions.  [phone]

## 2022-02-02 NOTE — Progress Notes (Signed)
Report given to [name] Ditch, RN at [facility] inpatient rehab. [facility] at bedside for discharge. Patient notified of transport. No PIV present. Right leg splinted currently by ortho- NWB on right leg.

## 2022-02-02 NOTE — Progress Notes (Signed)
[facility] rehab admissions - I have scheduled pick up with [facility] for 1 pm today.  Please have nurse call report to [phone] and the room number is [facility].  Call me for questions.  [phone]

## 2022-02-03 DIAGNOSIS — I6381 Other cerebral infarction due to occlusion or stenosis of small artery: Secondary | ICD-10-CM | POA: Diagnosis not present

## 2022-02-03 LAB — COMPREHENSIVE METABOLIC PANEL
ALT: 15 U/L (ref 0–44)
AST: 29 U/L (ref 15–41)
Albumin: 2.5 g/dL — ABNORMAL LOW (ref 3.5–5.0)
Alkaline Phosphatase: 120 U/L (ref 38–126)
Anion gap: 10 (ref 5–15)
BUN: 37 mg/dL — ABNORMAL HIGH (ref 8–23)
CO2: 22 mmol/L (ref 22–32)
Calcium: 8.5 mg/dL — ABNORMAL LOW (ref 8.9–10.3)
Chloride: 102 mmol/L (ref 98–111)
Creatinine, Ser: 2.16 mg/dL — ABNORMAL HIGH (ref 0.44–1.00)
GFR, Estimated: 25 mL/min — ABNORMAL LOW (ref >60)
Glucose, Bld: 142 mg/dL — ABNORMAL HIGH (ref 70–99)
Potassium: 4.5 mmol/L (ref 3.5–5.1)
Sodium: 134 mmol/L — ABNORMAL LOW (ref 135–145)
Total Bilirubin: 0.4 mg/dL (ref 0.3–1.2)
Total Protein: 7.5 g/dL (ref 6.5–8.1)

## 2022-02-03 LAB — CBC WITH DIFFERENTIAL/PLATELET
Abs Immature Granulocytes: 0.04 10*3/uL (ref 0.00–0.07)
Basophils Absolute: 0 10*3/uL (ref 0.0–0.1)
Basophils Relative: 1 %
Eosinophils Absolute: 0.2 10*3/uL (ref 0.0–0.5)
Eosinophils Relative: 3 %
HCT: 36.2 % (ref 36.0–46.0)
Hemoglobin: 12 g/dL (ref 12.0–15.0)
Immature Granulocytes: 1 %
Lymphocytes Relative: 33 %
Lymphs Abs: 2 10*3/uL (ref 0.7–4.0)
MCH: 28.4 pg (ref 26.0–34.0)
MCHC: 33.1 g/dL (ref 30.0–36.0)
MCV: 85.6 fL (ref 80.0–100.0)
Monocytes Absolute: 0.5 10*3/uL (ref 0.1–1.0)
Monocytes Relative: 9 %
Neutro Abs: 3.3 10*3/uL (ref 1.7–7.7)
Neutrophils Relative %: 53 %
Platelets: 359 10*3/uL (ref 150–400)
RBC: 4.23 MIL/uL (ref 3.87–5.11)
RDW: 13.9 % (ref 11.5–15.5)
WBC: 6.1 10*3/uL (ref 4.0–10.5)
nRBC: 0 % (ref 0.0–0.2)

## 2022-02-03 LAB — GLUCOSE, CAPILLARY
Glucose-Capillary: 137 mg/dL — ABNORMAL HIGH (ref 70–99)
Glucose-Capillary: 130 mg/dL — ABNORMAL HIGH (ref 70–99)
Glucose-Capillary: 72 mg/dL (ref 70–99)
Glucose-Capillary: 155 mg/dL — ABNORMAL HIGH (ref 70–99)

## 2022-02-03 MED ORDER — INSULIN GLARGINE-YFGN 100 UNIT/ML ~~LOC~~ SOLN
19.0000 [IU] | Freq: Every day | SUBCUTANEOUS | Status: DC
  Administered 2022-02-04 – 2022-02-07 (×4): 19 [IU] via SUBCUTANEOUS
  Filled 2022-02-03 (×6): qty 0.19

## 2022-02-03 NOTE — Plan of Care (Signed)
  Problem: RH Swallowing Goal: LTG Patient will consume least restrictive diet using compensatory strategies with assistance (SLP) Description: LTG:  Patient will consume least restrictive diet using compensatory strategies with assistance (SLP) Flowsheets (Taken 02/03/2022 1405) LTG: Pt Patient will consume least restrictive diet using compensatory strategies with assistance of (SLP): Supervision   Problem: RH Cognition - SLP Goal: RH LTG Patient will demonstrate orientation with cues Description:  LTG:  Patient will demonstrate orientation to person/place/time/situation with cues (SLP)   Flowsheets (Taken 02/03/2022 1405) LTG Patient will demonstrate orientation to:  Person  Place  Time  Situation LTG: Patient will demonstrate orientation using cueing (SLP): Supervision   Problem: RH Comprehension Communication Goal: LTG Patient will comprehend basic/complex auditory (SLP) Description: LTG: Patient will comprehend basic/complex auditory information with cues (SLP). Flowsheets (Taken 02/03/2022 1405) LTG: Patient will comprehend: Basic auditory information LTG: Patient will comprehend auditory information with cueing (SLP): Supervision   Problem: RH Expression Communication Goal: LTG Patient will verbally express basic/complex needs(SLP) Description: LTG:  Patient will verbally express basic/complex needs, wants or ideas with cues  (SLP) Flowsheets (Taken 02/03/2022 1405) LTG: Patient will verbally express basic/complex needs, wants or ideas (SLP): Supervision Goal: LTG Patient will increase word finding of common (SLP) Description: LTG:  Patient will increase word finding of common objects/daily info/abstract thoughts with cues using compensatory strategies (SLP). Flowsheets (Taken 02/03/2022 1405) LTG: Patient will increase word finding of common (SLP): Minimal Assistance - Patient > 75% Patient will use compensatory strategies to increase word finding of: Common objects   Problem:  RH Awareness Goal: LTG: Patient will demonstrate awareness during functional activites type of (SLP) Description: LTG: Patient will demonstrate awareness during functional activites type of (SLP) Flowsheets (Taken 02/03/2022 1405) Patient will demonstrate during cognitive/linguistic activities awareness type of:  Intellectual  Emergent LTG: Patient will demonstrate awareness during cognitive/linguistic activities with assistance of (SLP): Minimal Assistance - Patient > 75%

## 2022-02-03 NOTE — Progress Notes (Signed)
Inpatient Rehabilitation Care Coordinator Assessment and Plan Patient Details  Name: [name] Gilbert MRN: [id number] Date of Birth: [birth year]  Today's Date: 02/03/2022  Hospital Problems: Principal Problem:   Left thalamic infarction [facility])  Past Medical History:  Past Medical History:  Diagnosis Date   Diabetes mellitus without complication (HCC)    Hypertension    Stroke (HCC)    visual impaired    Past Surgical History:  Past Surgical History:  Procedure Laterality Date   ABDOMINAL HYSTERECTOMY     TONSILLECTOMY     Social History:  reports that she has never smoked. She has never used smokeless tobacco. She reports that she does not drink alcohol and does not use drugs.  Family / Support Systems Marital Status: Married Patient Roles: Spouse, Parent Spouse/Significant Other: [name] [phone]-home Children: Dela-daughter [phone] Other Supports: Friends Anticipated Caregiver: husband and daughter Ability/Limitations of Caregiver: Husband was just DC from [facility] after cardiac event and daughter does work Medical laboratory scientific officer: 24/7 Family Dynamics: Close with family and church members, she says they will call her and check on her but her main supports are her husband and daughter  Social History Preferred language: English Religion: None Cultural Background: No issues Education: HS Health Literacy - How often do you need to have someone help you when you read instructions, pamphlets, or other written material from your doctor or pharmacy?: Rarely Writes: Yes Employment Status: Disabled Marine scientist Issues: No issues Guardian/Conservator: None-according to MD pt is not fully capable of making her own decisions while here, will look toward her husband to make any decisions while here   Abuse/Neglect Abuse/Neglect Assessment Can Be Completed: Yes Physical Abuse: Denies Verbal Abuse: Denies Sexual Abuse: Denies Exploitation of patient/patient's  resources: Denies Self-Neglect: Denies  Patient response to: Social Isolation - How often do you feel lonely or isolated from those around you?: Never  Emotional Status Pt's affect, behavior and adjustment status: Pt is hopeful she will do well here and recover, she ahs in the past after her other CVA's. She has never been to rehab before did not need it. She was independent prior to this admission Recent Psychosocial Issues: other health issues-hx of multiple CVA's Psychiatric History: No history would benefit from seeng neuro-psych while here due to mulitple CVA's. Substance Abuse History: No issues  Patient / Family Perceptions, Expectations & Goals Pt/Family understanding of illness & functional limitations: Pt is able to explain she has had another stroke and broke her leg also in a fall. Daughter has spoken with the MD and feels she understands her Mom's condition and is hopeful she will do well here. Premorbid pt/family roles/activities: wife, Mom, retiree, church member Anticipated changes in roles/activities/participation: resume Pt/family expectations/goals: Pt states: " I want to get back to what I used to do."  Daughter states: " I hope she makes progress and is less care for [place] when she leaves."  [employer]: None Premorbid Home Care/DME Agencies: Other (Comment) (rw did not use PTA) Transportation available at discharge: Husband and daughter Is the patient able to respond to transportation needs?: Yes In the past 12 months, has lack of transportation kept you from medical appointments or from getting medications?: No In the past 12 months, has lack of transportation kept you from meetings, work, or from getting things needed for daily living?: No Resource referrals recommended: Neuropsychology  Discharge Planning Living Arrangements: Spouse/significant other Support Systems: Spouse/significant other, Children, Friends/neighbors, Church/faith  community Type of Residence: Private residence Insurance Resources: Private  Insurance (specify) ([employer] Medicare) Financial Resources: SSD, Family Support Financial Screen Referred: No Living Expenses: Own Money Management: Patient, Spouse Does the patient have any problems obtaining your medications?: No Home Management: both she and husband Patient/Family Preliminary Plans: Return home with husband and daughter to assist. Husband was in the hospital after a cardiac event 5/24 and is doing better. He is also older than pt-61 yo. Pt's daughter is involved and will assist but does work. Will await therapy evaluations and work on discharge needs. Care Coordinator Barriers to Discharge: Decreased caregiver support, Insurance for SNF coverage, Weight bearing restrictions Care Coordinator Anticipated Follow Up Needs: HH/OP  Clinical Impression Pleasant female who is motivated to do well and does remember she has WB issues with her fractured leg. Her husband is in fairly good health but has just gotten discharged from [facility] after cardiac event. Her daughter will assist when she can. Will await evaluations and work on discharge needs.  [name] 02/03/2022, 10:18 AM

## 2022-02-03 NOTE — Evaluation (Addendum)
Speech Language Pathology Assessment and Plan  Patient Details  Name: [name] Gilbert MRN: [id number] Date of Birth: [birth year]  SLP Diagnosis: Aphasia;Apraxia;Cognitive Impairments;Dysphagia  Rehab Potential: Good ELOS: 2 weeks    Today's Date: 02/03/2022 SLP Individual Time: 6468-0321 SLP Individual Time Calculation (min): 57 min   Hospital Problem: Principal Problem:   Left thalamic infarction [facility])  Past Medical History:  Past Medical History:  Diagnosis Date   Diabetes mellitus without complication ([place])    Hypertension    Stroke ([place])    visual impaired    Past Surgical History:  Past Surgical History:  Procedure Laterality Date   ABDOMINAL HYSTERECTOMY     TONSILLECTOMY      Assessment / Plan / Recommendation Clinical Impression HPI: [name] Gilbert is a 61 year old right-handed female with history of hypertension, , CVA with left-sided residual weakness as well as aphasia maintained on low-dose aspirin, diabetes mellitus, diastolic congestive heart failure, CKD stage III.  Per chart review patient lives with spouse.  1 level home 2 steps to entry.  Modified independent prior to admission.  Presented to [facility] 01/25/2022 with aphasia with increasing  weakness and recent fall with complaints of right ankle pain.  Noted blood pressure 204/98.  Cranial CT scan showed relatively large acute to subacute infarct in the posterior and mesial left temporal lobe new since February.  No hemorrhagic transformation or significant mass effect.  CT cervical spine negative.  MRI/MRA confluent acute infarct in the medial left temporal lobe with additional punctate infarct in the left thalamus.  Associated edema without significant mass effect.  Remote infarct and moderate chronic microvascular disease.  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  Similar severe distal M1/proximal M2 MCA stenosis bilaterally.  Patient did not receive tPA.  Admission chemistries unremarkable except sodium  132 glucose 261 BUN 27 creatinine 1.71, troponin 62-50-44, BNP 80.8, urinalysis negative nitrite, urine drug screen negative.  Echocardiogram with ejection fraction of 60 to 22% grade 1 diastolic dysfunction.  Elevated troponin felt to be related to demand ischemia.  Presently maintained on aspirin 81 mg daily and Plavix 75 mg daily for CVA prophylaxis for 90 days.  Lovenox added for DVT prophylaxis.  Maintained on Keppra for seizure prophylaxis.  X-rays of right ankle revealed a minimally displaced right distal fibular fracture with orthopedic services consulted Dr. Milagros Evener with no surgical intervention placed in a posterior splint with a sugar-tong supplement and nonweightbearing.  Tolerating mechanical soft diet.  Monitoring of AKI on CKD latest creatinine 2.04.  Episode of hyperkalemia 02/01/2022 treated.  Palliative care consulted to establish goals of care.  Therapy evaluations completed due to patient decreased functional mobility and speech difficulties was admitted for a comprehensive rehab program on 02/03/2022.  SLP consulted for CSE, motor speech assessment, and language evaluation s/p acute L thalamic CVA and hx of multiple CVA's. Pt received awake/alert, though affect flat throughout. Sitting upright in bed with meal tray in front of her. Agreeable to ST evaluation.  Currently, pt presents with mild oral dysphagia c/b intermittently prolonged mastication with solid textures. No evidence c/f pharyngeal dysphagia with consistencies assessed; no cough, throat clearing, nor change in vocal quality appreciated. Orofacial function, cognitive status, and oral motor abilities support continuation of Dysphagia 3 textures with thin liquids via cup and/or straw with adherence to general aspiration precautions and full supervision for help with initiation and intake. May provide medications whole with puree.  Re: speech and language, pt presents with mild fluent aphasia, which appears further compounded  by  at least mild-moderate motor planning impairment (better performance with functional tasks) resulting in whole-word dysfluencies (which pt reports to be worse than baseline), verbal and oral motor perseveration, and decreased ability to verbally provide name of targeted objects; pt accurately selected the name of targeted objects when given written choice prompts (field of 3); however, could not name items. Pt further endorses "I know what it is, I just can't say what it is." Pt with intact repetition at the word level with breakdown occurring at the sentence level. Auditory comprehension appears to be a strength of pt's with answers to complex yes/no questions being ~75% accurate; 100% for biographical information. Expressively, pt verbalized personal information and answered closed-ended questions more readily than open-ended questions. Benefited from written prompts, sentence completion, and phonemic + semantic cues for confrontational and generative naming. Re: cognition, pt is grossly oriented x 3; Min A for date and month. Basic problem-solving for verbalizing month and date noted, though skill was not formally addressed. Decreased intellectual and emergent awareness noted for current WB limitations and situation. Will continue to assess cognitive-linguistics abilities next session. No family present to provide PLOF information, though pt reports she was cooking and managing her medications independently; completed financial management with her husband. Was not driving PTA.  Pt would benefit from skilled ST intervention during CIR admission to address aforementioned deficits, maximize pt's independence, and decrease caregiver burden. Please see below for additional details.   Skilled Therapeutic Interventions          Clinical swallow, motor speech, language, and brief cognitive-linguistic assessments completed. Please see above for details.   SLP Assessment  Patient will need skilled Speech Lanaguage  Pathology Services during CIR admission    Recommendations  SLP Diet Recommendations: Dysphagia 3 (Mech soft);Thin Liquid Administration via: Cup;Straw Medication Administration: Whole meds with puree Supervision: Patient able to self feed;Full supervision/cueing for compensatory strategies Compensations: Minimize environmental distractions;Slow rate;Small sips/bites;Lingual sweep for clearance of pocketing;Follow solids with liquid Postural Changes and/or Swallow Maneuvers: Out of bed for meals;Seated upright 90 degrees;Upright 30-60 min after meal Oral Care Recommendations: Oral care BID Patient destination: Home Follow up Recommendations: Outpatient SLP;24 hour supervision/assistance Equipment Recommended: None recommended by SLP    SLP Frequency 3 to 5 out of 7 days   SLP Duration  SLP Intensity  SLP Treatment/Interventions 2 weeks  Minumum of 1-2 x/day, 30 to 90 minutes  Functional tasks;Cueing hierarchy;Dysphagia/aspiration precaution training;Multimodal communication approach;Patient/family education;Therapeutic Activities;Speech/Language facilitation    Pain Pain Assessment Pain Scale: 0-10 Pain Score: 0-No pain  Prior Functioning Type of Home: House  Lives With: Spouse Available Help at Discharge: Family;Available 24 hours/day Education: [employer] to college Vocation: Retired  Programmer, systems Overall Cognitive Status: History of cognitive impairments - at baseline Arousal/Alertness: Awake/alert Orientation Level: Oriented to person;Oriented to situation;Disoriented to place;Disoriented to time Attention: Focused;Sustained Focused Attention: Impaired Focused Attention Impairment: Verbal basic;Functional basic Sustained Attention: Impaired Sustained Attention Impairment: Verbal basic;Functional basic Memory: Impaired Memory Impairment: Retrieval deficit;Decreased recall of new information Awareness: Impaired Awareness Impairment: Intellectual  impairment;Emergent impairment Problem Solving: Impaired Problem Solving Impairment: Verbal basic;Functional basic Executive Function: Sequencing;Initiating;Self Correcting;Decision Making Sequencing: Impaired Sequencing Impairment: Functional basic Decision Making: Impaired Decision Making Impairment: Functional basic;Verbal basic Initiating: Impaired Initiating Impairment: Functional basic Self Correcting: Impaired Self Correcting Impairment: Functional basic Behaviors: Perseveration;Other (comment) (flat affect) Safety/Judgment: Impaired  Comprehension Auditory Comprehension Overall Auditory Comprehension: Impaired Yes/No Questions: Impaired Basic Biographical Questions: 76-100% accurate (100% accuracy) Basic Immediate Environment Questions: 75-100% accurate (80% accuracy)  Complex Questions: 75-100% accurate (75% accuracy) Paragraph Comprehension (via yes/no questions): Not tested Commands: Impaired One Step Basic Commands: 50-74% accurate Two Step Basic Commands: 25-49% accurate Multistep Basic Commands: 25-49% accurate Other Commands Comment: Command following appeard to be negatively impacted by motor planning deficits vs auditory comprehension deficits Conversation: Simple Interfering Components: Processing speed;Working Field seismologist: Slowed speech;Extra processing time;Repetition;Pausing Visual Recognition/Discrimination Discrimination:  (Appears to exhibit R gaze preference) Reading Comprehension Reading Status: Impaired Word level: Impaired Sentence Level: Impaired Expression Expression Primary Mode of Expression: Verbal Verbal Expression Overall Verbal Expression: Impaired Initiation: Impaired Automatic Speech: Social Response;Name;Day of week;Month of year Level of Generative/Spontaneous Verbalization: Sentence Repetition: Impaired (~70% accuracy) Level of Impairment: Sentence level Naming: Impairment Responsive: Not tested Confrontation:  Within functional limits Convergent: Not tested Divergent: Not tested Other Naming Comments: Pt appeared to identify the name of objects when provided with written choice prompts (field of 3) with 100% accuracy, though could not name item verbally. States she knows what the items name is though cannot say it. Apraxia appears to limit pt's verbal responses Verbal Errors: Perseveration Pragmatics: Impairment Impairments: Abnormal affect;Monotone Effective Techniques: Sentence completion;Phonemic cues;Semantic cues;Written cues Written Expression Dominant Hand: Right Written Expression: Not tested Oral Motor Oral Motor/Sensory Function Overall Oral Motor/Sensory Function: Mild impairment Facial ROM: Reduced right Facial Symmetry: Abnormal symmetry right Facial Strength:  (UTA) Facial Sensation: Within Functional Limits Lingual ROM: Within Functional Limits Lingual Symmetry:  (UTA) Lingual Sensation: Within Functional Limits Mandible: Within Functional Limits Motor Speech Overall Motor Speech: Appears within functional limits for tasks assessed Respiration: Within functional limits Phonation: Normal Resonance: Within functional limits Intelligibility: Intelligible Motor Planning:  (Needs further assessment) Effective Techniques: Slow rate  Care Tool Care Tool Cognition Ability to hear (with hearing aid or hearing appliances if normally used Ability to hear (with hearing aid or hearing appliances if normally used): 0. Adequate - no difficulty in normal conservation, social interaction, listening to TV   Expression of Ideas and Wants Expression of Ideas and Wants: 3. Some difficulty - exhibits some difficulty with expressing needs and ideas (e.g, some words or finishing thoughts) or speech is not clear   Understanding Verbal and Non-Verbal Content Understanding Verbal and Non-Verbal Content: 3. Usually understands - understands most conversations, but misses some part/intent of  message. Requires cues at times to understand  Memory/Recall Ability Memory/Recall Ability : That he or she is in a hospital/hospital unit   Bedside Swallowing Assessment General Date of Onset: 01/25/22 Previous Swallow Assessment: CSE on 01/29/2022 with recommendations for Dysphagia 3 and thin liquids Diet Prior to this Study: Dysphagia 3 (soft);Thin liquids Temperature Spikes Noted: No Respiratory Status: Room air History of Recent Intubation: No Behavior/Cognition: Alert;Cooperative;Pleasant mood;Confused;Distractible;Requires cueing Oral Cavity - Dentition: Adequate natural dentition;Missing dentition Self-Feeding Abilities: Needs set up;Needs assist Vision: Impaired for self-feeding (R gaze preference) Patient Positioning: Upright in bed Baseline Vocal Quality: Normal Volitional Cough: Strong Volitional Swallow: Able to elicit   Ice Chips Ice chips: Not tested Thin Liquid Thin Liquid: Within functional limits Presentation: Straw;Cup Nectar Thick Nectar Thick Liquid: Not tested Honey Thick Honey Thick Liquid: Not tested Puree Puree: Impaired Presentation: Spoon Oral Phase Functional Implications: Prolonged oral transit Pharyngeal Phase Impairments:  (WFL) Solid Solid: Impaired Presentation: Self Fed Oral Phase Impairments: Reduced lingual movement/coordination BSE Assessment Suspected Esophageal Findings Suspected Esophageal Findings:  (N/A) Risk for Aspiration Impact on safety and function: Mild aspiration risk Other Related Risk Factors: Previous CVA;Lethargy;Cognitive impairment  Short Term Goals: Week 1: SLP Short Term Goal  1 (Week 1): Pt will match object/picture to corresponding word with 100% accuracy given Sup A. SLP Short Term Goal 2 (Week 1): Pt will verbalize name of object/picture on 50% of trials given Mod A. SLP Short Term Goal 3 (Week 1): Pt will participate in further cognitive-linguistic and communication assessment to further inform ST POC and d/c  planning with 100% completion. SLP Short Term Goal 4 (Week 1): Pt will consume Dysphagia 3 diet textures with thin liquids given minimal overt s/sx concerning for aspiration and Min A for use of safe swallowing strategies to include alternating bites and sips. SLP Short Term Goal 5 (Week 1): Pt will recall daily information x 3 with use of external aids. SLP Short Term Goal 6 (Week 1): Pt will follow 1-step commands on 50% of trials given Mod A.  Refer to Care Plan for Long Term Goals  Recommendations for other services: None   Discharge Criteria: Patient will be discharged from SLP if patient refuses treatment 3 consecutive times without medical reason, if treatment goals not met, if there is a change in medical status, if patient makes no progress towards goals or if patient is discharged from hospital.  The above assessment, treatment plan, treatment alternatives and goals were discussed and mutually agreed upon: by patient  Romelle Starcher A Riyah Bardon 02/03/2022, 2:03 PM

## 2022-02-03 NOTE — Plan of Care (Signed)
  Problem: Consults Goal: RH STROKE PATIENT EDUCATION Description: See Patient Education module for education specifics  Outcome: Progressing   Problem: RH BOWEL ELIMINATION Goal: RH STG MANAGE BOWEL WITH ASSISTANCE Description: STG Manage Bowel with  mod I Assistance. Outcome: Not Progressing Goal: RH STG MANAGE BOWEL W/MEDICATION W/ASSISTANCE Description: STG Manage Bowel with Medication with mod I Assistance. Outcome: Not Progressing   Problem: RH BLADDER ELIMINATION Goal: RH STG MANAGE BLADDER WITH ASSISTANCE Description: STG Manage Bladder With toileting Assistance Outcome: Not Progressing   Problem: RH SKIN INTEGRITY Goal: RH STG MAINTAIN SKIN INTEGRITY WITH ASSISTANCE Description: STG Maintain Skin Integrity With min Assistance. Outcome: Progressing   Problem: RH SAFETY Goal: RH STG ADHERE TO SAFETY PRECAUTIONS W/ASSISTANCE/DEVICE Description: STG Adhere to Safety Precautions With cues Assistance/Device. Outcome: Progressing   Problem: RH KNOWLEDGE DEFICIT Goal: RH STG INCREASE KNOWLEDGE OF DIABETES Description: Patient and spouse will be able to manage DM with medications and dietary modifications using handouts and educational materials independently Outcome: Progressing Goal: RH STG INCREASE KNOWLEDGE OF HYPERTENSION Description: Patient and spouse will be able to manage HTN with medications and dietary modifications using handouts and educational materials independently Outcome: Progressing Goal: RH STG INCREASE KNOWLEGDE OF HYPERLIPIDEMIA Description: Patient and spouse will be able to manage HLD with medications and dietary modifications using handouts and educational materials independently Outcome: Progressing Goal: RH STG INCREASE KNOWLEDGE OF STROKE PROPHYLAXIS Outcome: Progressing

## 2022-02-03 NOTE — Progress Notes (Addendum)
Patient ID: [name] Gilbert, female   DOB: [birth year], 61 y.o.   MRN: [id number] Met with the patient and spouse to review rehab process, team conference and plan of care. Discussed secondary risk management including HTN, DM (A1C 11.1) and HLD (LDL 214/Trig 70) with medications and dietary modification recommendations. Discussed HH/CMM diet, increased protein intake with carbs and food choice options along with small meals through the day instead of one large meal at dinner. Reviewed DAPT with ASA + Plavix x 90 days then ASA solo. Reviewed weight bearing on right and premorbid weakness on left; now using slideboard and wheelchair. Spouse to measure doorframes and check access in wheelchair at home. Also has two step to entry that may need a ramp to get into the home as spouse has medical issues of his own. Continue to follow along to discharge to address educational needs to facilitate preparation for discharge home. Margarito Liner

## 2022-02-03 NOTE — Evaluation (Signed)
Physical Therapy Assessment and Plan  Patient Details  Name: [name] Gilbert MRN: [id number] Date of Birth: 02-12-1961  PT Diagnosis: Abnormal posture, Abnormality of gait, Cognitive deficits, Difficulty walking, Hypertonia, Impaired cognition, Muscle weakness, Pain in RLE, and Quadriplegia Rehab Potential: Fair ELOS: 2-3 weeks   Today's Date: 02/03/2022 PT Individual Time: 1300-1400 PT Individual Time Calculation (min): 60 min    Hospital Problem: Principal Problem:   Left thalamic infarction [facility])   Past Medical History:  Past Medical History:  Diagnosis Date   Diabetes mellitus without complication ([place])    Hypertension    Stroke ([place])    visual impaired    Past Surgical History:  Past Surgical History:  Procedure Laterality Date   ABDOMINAL HYSTERECTOMY     TONSILLECTOMY      Assessment & Plan Clinical Impression: Patient is a 61 year old right-handed female with history of hypertension, , CVA with left-sided residual weakness as well as aphasia maintained on low-dose aspirin, diabetes mellitus, diastolic congestive heart failure, CKD stage III.  Per chart review patient lives with spouse.  1 level home 2 steps to entry.  Modified independent prior to admission.  [facility]ed to [facility] with aphasia with increasing  weakness and recent fall with complaints of right ankle pain.  Noted blood pressure 204/98.  Cranial CT scan showed relatively large acute to subacute infarct in the posterior and mesial left temporal lobe new since February.  No hemorrhagic transformation or significant mass effect.  CT cervical spine negative.  MRI/MRA confluent acute infarct in the medial left temporal lobe with additional punctate infarct in the left thalamus.  Associated edema without significant mass effect.  Remote infarct and moderate chronic microvascular disease.  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  Similar severe distal M1/proximal M2 MCA stenosis bilaterally.  Patient  did not receive tPA.  Admission chemistries unremarkable except sodium 132 glucose 261 BUN 27 creatinine 1.71, troponin 62-50-44, BNP 80.8, urinalysis negative nitrite, urine drug screen negative.  Echocardiogram with ejection fraction of 60 to 47% grade 1 diastolic dysfunction.  Elevated troponin felt to be related to demand ischemia.  Presently maintained on aspirin 81 mg daily and Plavix 75 mg daily for CVA prophylaxis for 90 days.  Lovenox added for DVT prophylaxis.  Maintained on Keppra for seizure prophylaxis.  X-rays of right ankle revealed a minimally displaced right distal fibular fracture with orthopedic services consulted Dr. Milagros Evener with no surgical intervention placed in a posterior splint with a sugar-tong supplement and nonweightbearing.  Tolerating mechanical soft diet.  Monitoring of AKI on CKD latest creatinine 2.04.  Episode of hyperkalemia 02/01/2022 treated.  Palliative care consulted to establish goals of care.  Therapy evaluations completed due to patient decreased functional mobility and speech difficulties was admitted for a comprehensive rehab program. Patient transferred to CIR on 02/02/2022 .   Patient currently requires max with mobility secondary to muscle weakness and muscle joint tightness, decreased cardiorespiratoy endurance, abnormal tone, unbalanced muscle activation, and motor apraxia, decreased visual perceptual skills, decreased attention to right and decreased motor planning, decreased initiation, decreased attention, decreased awareness, decreased problem solving, and decreased safety awareness, and decreased sitting balance, decreased standing balance, decreased postural control, hemiplegia, and decreased balance strategies.  Prior to hospitalization, patient was modified independent  with mobility and lived with Spouse in a House home.  Home access is 3Stairs to enter.  Patient will benefit from skilled PT intervention to maximize safe functional mobility, minimize  fall risk, and decrease caregiver burden for planned  discharge home with 24 hour assist.  Anticipate patient will benefit from follow up [place] at discharge.  PT - End of Session Activity Tolerance: Tolerates < 10 min activity with changes in vital signs Endurance Deficit: Yes Endurance Deficit Description: very limited standing tolerance/endurance PT Assessment Rehab Potential (ACUTE/IP ONLY): Fair PT Barriers to Discharge: Decreased caregiver support;Home environment access/layout;Insurance for SNF coverage;Weight bearing restrictions;Lack of/limited family support;Incontinence PT Patient demonstrates impairments in the following area(s): Balance;Motor;Endurance;Safety;Sensory;Skin Integrity;Pain PT Transfers Functional Problem(s): Bed Mobility;Bed to Chair;Car PT Locomotion Functional Problem(s): Ambulation;Wheelchair Mobility;Stairs PT Plan PT Intensity: Minimum of 1-2 x/day ,45 to 90 minutes PT Frequency: 5 out of 7 days PT Duration Estimated Length of Stay: 2-3 weeks PT Treatment/Interventions: Ambulation/gait training;Discharge planning;Functional mobility training;Psychosocial support;Therapeutic Activities;Visual/perceptual remediation/compensation;Wheelchair propulsion/positioning;Therapeutic Exercise;Skin care/wound management;Neuromuscular re-education;Disease management/prevention;Balance/vestibular training;Cognitive remediation/compensation;DME/adaptive equipment instruction;Splinting/orthotics;Pain management;UE/LE Strength taining/ROM;UE/LE Coordination activities;Stair training;Patient/family education;Functional electrical stimulation;Community reintegration PT Transfers Anticipated Outcome(s): minA PT Locomotion Anticipated Outcome(s): Anticipate primarily wheelchair level at discharge PT Recommendation Recommendations for Other Services: Neuropsych consult Follow Up Recommendations: Home health PT;24 hour supervision/assistance Patient destination: Home Equipment Recommended:  To be determined   PT Evaluation Precautions/Restrictions Precautions Precautions: Fall Restrictions Weight Bearing Restrictions: Yes RLE Weight Bearing: Weight bearing as tolerated Pain Interference Pain Interference Pain Effect on Sleep: 8. Unable to answer Pain Interference with Therapy Activities: 8. Unable to answer Pain Interference with Day-to-Day Activities: 8. Unable to answer Home Living/Prior Functioning Home Living Living Arrangements: Spouse/significant other Available Help at Discharge: Family;Available 24 hours/day Type of Home: House Home Access: Stairs to enter [employer] of Steps: 3 Entrance Stairs-Rails: None Home Layout: One level Bathroom Shower/Tub: Government social research officer Accessibility: Yes Additional Comments: ADL and IADLs history and home living environment primarily taken from chart review due to pt exhibiting expressive and memory impairments  Lives With: Spouse Prior Function  Able to Take Stairs?: Yes Driving: No Vocation: Retired Leisure: Hobbies-no (watching TV) Vision/Perception  Vision - History Ability to See in Adequate Light: 1 Impaired Vision - Assessment Ocular Range of Motion: Restricted on the left;Restricted looking up;Restricted looking down;Restricted on the right Alignment/Gaze Preference: Gaze right Tracking/Visual Pursuits: Left eye does not track laterally;Right eye does not track medially;Decreased smoothness of vertical tracking;Decreased smoothness of horizontal tracking;Unable to hold eye position out of midline;Requires cues, head turns, or add eye shifts to track Saccades: Decreased speed of saccadic movement;Additional eye shifts occurred during testing;Undershoots Convergence: Impaired - to be further tested in functional context Perception Perception: Impaired Inattention/Neglect: Does not attend to left visual field;Does not attend to right side of body Praxis Praxis:  Impaired Praxis Impairment Details: Initiation;Motor planning  Cognition Overall Cognitive Status: History of cognitive impairments - at baseline Arousal/Alertness: Awake/alert Orientation Level: Oriented to person;Oriented to situation;Disoriented to place;Disoriented to time Attention: Focused;Sustained Focused Attention: Impaired Focused Attention Impairment: Verbal basic;Functional basic Sustained Attention: Impaired Sustained Attention Impairment: Verbal basic;Functional basic Memory: Impaired Memory Impairment: Retrieval deficit;Decreased recall of new information Awareness: Impaired Awareness Impairment: Intellectual impairment;Emergent impairment Problem Solving: Impaired Problem Solving Impairment: Verbal basic;Functional basic Executive Function: Sequencing;Initiating;Self Correcting;Decision Making Sequencing: Impaired Sequencing Impairment: Functional basic Decision Making: Impaired Decision Making Impairment: Functional basic;Verbal basic Initiating: Impaired Initiating Impairment: Functional basic Self Correcting: Impaired Self Correcting Impairment: Functional basic Behaviors: Perseveration;Other (comment) (flat affect) Safety/Judgment: Impaired Sensation Sensation Light Touch: Impaired by gross assessment Hot/Cold: Appears Intact Proprioception: Impaired by gross assessment Stereognosis: Impaired by gross assessment Coordination Gross Motor Movements are Fluid and Coordinated: No Fine Motor Movements are Fluid and Coordinated: No  Finger Nose Finger Test: unable to isolate digits to complete with precision Heel Shin Test: UTA due to hip flexor weakness Motor  Motor Motor: Hemiplegia Motor - Skilled Clinical Observations: 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   Trunk/Postural Assessment  Cervical Assessment Cervical Assessment: Exceptions to [facility] (forward head) Thoracic Assessment Thoracic Assessment: Exceptions to [facility] (rounded  shoulders) Lumbar Assessment Lumbar Assessment: Exceptions to [facility] (posterior pelvic tilt) Postural Control Postural Control: Deficits on evaluation (inadequate)  Balance Balance Balance Assessed: Yes Static Sitting Balance Static Sitting - Balance Support: No upper extremity supported Static Sitting - Level of Assistance: 5: Stand by assistance Dynamic Sitting Balance Dynamic Sitting - Balance Support: Bilateral upper extremity supported Dynamic Sitting - Level of Assistance: 4: Min Insurance risk surveyor Standing - Balance Support: Bilateral upper extremity supported (// bars) Static Standing - Level of Assistance: 3: Mod assist Dynamic Standing Balance Dynamic Standing - Balance Support: Bilateral upper extremity supported Dynamic Standing - Level of Assistance: 2: Max assist (// bars) Extremity Assessment  RUE Assessment RUE Assessment: Exceptions to [facility] RUE Body System: Neuro Brunstrum levels for arm and hand: Arm;Hand Brunstrum level for arm: Stage II Synergy is developing Brunstrum level for hand: Stage II Synergy is developing LUE Assessment General Strength Comments: 3+/5 RLE Assessment RLE Assessment: Exceptions to [facility] General Strength Comments: Casted from knee to toes. Knee ext 3/5, hip flex 2-/5 LLE Assessment LLE Assessment: Exceptions to [facility] General Strength Comments: Grossly 3+/5  Care Tool Care Tool Bed Mobility Roll left and right activity   Roll left and right assist level: Moderate Assistance - Patient 50 - 74%    Sit to lying activity   Sit to lying assist level: Moderate Assistance - Patient 50 - 74%    Lying to sitting on side of bed activity   Lying to sitting on side of bed assist level: the ability to move from lying on the back to sitting on the side of the bed with no back support.: Moderate Assistance - Patient 50 - 74%     Care Tool Transfers Sit to stand transfer   Sit to stand assist level: Moderate Assistance - Patient  50 - 74%    Chair/bed transfer   Chair/bed transfer assist level: Maximal Assistance - Patient 25 - 49%     Toilet transfer   Assist Level: 2 Production assistant, radio transfer activity did not occur: Safety/medical concerns        Care Tool Locomotion Ambulation Ambulation activity did not occur: Safety/medical concerns        Walk 10 feet activity Walk 10 feet activity did not occur: Safety/medical concerns       Walk 50 feet with 2 turns activity Walk 50 feet with 2 turns activity did not occur: Safety/medical concerns      Walk 150 feet activity Walk 150 feet activity did not occur: Safety/medical concerns      Walk 10 feet on uneven surfaces activity Walk 10 feet on uneven surfaces activity did not occur: Safety/medical concerns      Stairs Stair activity did not occur: Safety/medical concerns        Walk up/down 1 step activity Walk up/down 1 step or curb (drop down) activity did not occur: Safety/medical concerns      Walk up/down 4 steps activity Walk up/down 4 steps activity did not occur: Safety/medical concerns      Walk up/down 12 steps activity Walk up/down 12  steps activity did not occur: Safety/medical concerns      Pick up small objects from floor Pick up small object from the floor (from standing position) activity did not occur: Safety/medical concerns      Wheelchair Is the patient using a wheelchair?: Yes Type of Wheelchair: Manual   Wheelchair assist level: Dependent - Patient 0%    Wheel 50 feet with 2 turns activity   Assist Level: Dependent - Patient 0%  Wheel 150 feet activity   Assist Level: Dependent - Patient 0%    Refer to Care Plan for Long Term Goals  SHORT TERM GOAL WEEK 1 PT Short Term Goal 1 (Week 1): Pt will complete bed mobility with modA PT Short Term Goal 2 (Week 1): Pt will complete bed<>chair transfers with modA and LRAD PT Short Term Goal 3 (Week 1): Pt will propel wheelchair 31f with modA  Recommendations for  other services: Neuropsych  Skilled Therapeutic Intervention Mobility Bed Mobility Bed Mobility: Rolling Right;Rolling Left;Right Sidelying to Sit;Sit to Supine Rolling Right: Moderate Assistance - Patient 50-74% Rolling Left: Moderate Assistance - Patient 50-74% Right Sidelying to Sit: Moderate Assistance - Patient 50-74% Sit to Supine: Moderate Assistance - Patient 50-74% Transfers Transfers: Stand to Sit;Sit to Stand Sit to Stand: Moderate Assistance - Patient 50-74% Stand to Sit: Moderate Assistance - Patient 50-74% Transfer (Assistive device): Other (Comment) (// bars) Locomotion  Gait Ambulation: No Gait Gait: No Stairs / Additional Locomotion Stairs: No WArchitect Yes Wheelchair Assistance: Dependent - Patient 0% Wheelchair Propulsion: Other (comment) (unable due to weakness) Wheelchair Parts Management: Needs assistance Distance: [id number]  Discharge Criteria: Patient will be discharged from PT if patient refuses treatment 3 consecutive times without medical reason, if treatment goals not met, if there is a change in medical status, if patient makes no progress towards goals or if patient is discharged from hospital.  The above assessment, treatment plan, treatment alternatives and goals were discussed and mutually agreed upon: by patient and by family  ChAlger SimonsT ,DPT 02/03/2022, 2:03 PM

## 2022-02-03 NOTE — Progress Notes (Signed)
Inpatient Rehabilitation Center Individual Statement of Services  Patient Name:  [name] Gilbert  Date:  02/03/2022  Welcome to the Inpatient Rehabilitation Center.  Our goal is to provide you with an individualized program based on your diagnosis and situation, designed to meet your specific needs.  With this comprehensive rehabilitation program, you will be expected to participate in at least 3 hours of rehabilitation therapies Monday-Friday, with modified therapy programming on the weekends.  Your rehabilitation program will include the following services:  Physical Therapy (PT), Occupational Therapy (OT), Speech Therapy (ST), 24 hour per day rehabilitation nursing, Neuropsychology, Care Coordinator, Rehabilitation Medicine, Nutrition Services, and Pharmacy Services  Weekly team conferences will be held on Wednesday to discuss your progress.  Your Inpatient Rehabilitation Care Coordinator will talk with you frequently to get your input and to update you on team discussions.  Team conferences with you and your family in attendance may also be held.  Expected length of stay: 2.5-3 weeks  Overall anticipated outcome: overall min assist wheelchair level  Depending on your progress and recovery, your program may change. Your Inpatient Rehabilitation Care Coordinator will coordinate services and will keep you informed of any changes. Your Inpatient Rehabilitation Care Coordinator's name and contact numbers are listed  below.  The following services may also be recommended but are not provided by the Inpatient Rehabilitation Center:   Home Health Rehabiltiation Services    Arrangements will be made to provide these services after discharge if needed.  Arrangements include referral to agencies that provide these services.  Your insurance has been verified to be:  [employer] Your primary doctor is:  Cristela Blue  Pertinent information will be shared with your doctor and your insurance  company.  Inpatient Rehabilitation Care Coordinator:  Dossie Der, [place] [phone] or Luna Glasgow  Information discussed with and copy given to patient by: [name], 02/03/2022, 10:21 AM

## 2022-02-03 NOTE — Evaluation (Signed)
Occupational Therapy Assessment and Plan  Patient Details  Name: [name] Gilbert MRN: [id number] Date of Birth: 04-Sep-1961  OT Diagnosis: abnormal posture, apraxia, cognitive deficits, disturbance of vision, hemiplegia affecting dominant side, and muscle weakness (generalized) Rehab Potential: Rehab Potential (ACUTE ONLY): Good ELOS: 3 weeks   Today's Date: 02/03/2022 OT Individual Time: 5427-0623 OT Individual Time Calculation (min): 70 min     Hospital Problem: Principal Problem:   Left thalamic infarction [facility])   Past Medical History:  Past Medical History:  Diagnosis Date   Diabetes mellitus without complication ([place])    Hypertension    Stroke ([place])    visual impaired    Past Surgical History:  Past Surgical History:  Procedure Laterality Date   ABDOMINAL HYSTERECTOMY     TONSILLECTOMY      Assessment & Plan Clinical Impression: Patient is a 61 y.o. year old [name] Gilbert is a 61 year old right-handed female with history of hypertension, , CVA with left-sided residual weakness as well as aphasia maintained on low-dose aspirin, diabetes mellitus, diastolic congestive heart failure, CKD stage III.  Per chart review patient lives with spouse.  1 level home 2 steps to entry.  Modified independent prior to admission.  Presented to [facility] 01/25/2022 with aphasia with increasing  weakness and recent fall with complaints of right ankle pain.  Noted blood pressure 204/98.  Cranial CT scan showed relatively large acute to subacute infarct in the posterior and mesial left temporal lobe new since February.  No hemorrhagic transformation or significant mass effect.  CT cervical spine negative.  MRI/MRA confluent acute infarct in the medial left temporal lobe with additional punctate infarct in the left thalamus.  Associated edema without significant mass effect.  Remote infarct and moderate chronic microvascular disease.  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  Similar severe  distal M1/proximal M2 MCA stenosis bilaterally.  Patient did not receive tPA.  Admission chemistries unremarkable except sodium 132 glucose 261 BUN 27 creatinine 1.71, troponin 62-50-44, BNP 80.8, urinalysis negative nitrite, urine drug screen negative.  Echocardiogram with ejection fraction of 60 to 76% grade 1 diastolic dysfunction.  Elevated troponin felt to be related to demand ischemia.  Presently maintained on aspirin 81 mg daily and Plavix 75 mg daily for CVA prophylaxis for 90 days.  Lovenox added for DVT prophylaxis.  Maintained on Keppra for seizure prophylaxis.  X-rays of right ankle revealed a minimally displaced right distal fibular fracture with orthopedic services consulted Dr. Milagros Evener with no surgical intervention placed in a posterior splint with a sugar-tong supplement and nonweightbearing.  Tolerating mechanical soft diet.  Monitoring of AKI on CKD latest creatinine 2.04.  Episode of hyperkalemia 02/01/2022 treated.  Palliative care consulted to establish goals of care. Patient transferred to CIR on 02/02/2022 .    Patient currently requires max with basic self-care skills secondary to muscle weakness, decreased cardiorespiratoy endurance, impaired timing and sequencing, unbalanced muscle activation, motor apraxia, decreased coordination, and decreased motor planning, decreased visual acuity, decreased visual perceptual skills, and decreased visual motor skills, decreased attention to left, right side neglect, and decreased motor planning, decreased initiation, decreased attention, decreased awareness, decreased problem solving, decreased memory, and delayed processing, and decreased sitting balance, decreased standing balance, decreased postural control, hemiplegia, and decreased balance strategies.  Prior to hospitalization, patient could complete ADLS with independent  per chart review and pt.  Patient will benefit from skilled intervention to increase independence with basic self-care  skills prior to discharge home with care partner.  Anticipate patient  will require 24 hour supervision and minimal physical assistance and follow up home health.  OT - End of Session Activity Tolerance: Tolerates 30+ min activity with multiple rests Endurance Deficit: Yes Endurance Deficit Description: very limited standing tolerance/endurance OT Assessment Rehab Potential (ACUTE ONLY): Good OT Barriers to Discharge: Incontinence;Weight bearing restrictions;Weight OT Patient demonstrates impairments in the following area(s): Balance;Perception;Cognition;Safety;Sensory;Skin Integrity;Edema;Endurance;Vision;Motor OT Basic ADL's Functional Problem(s): Grooming;Dressing;Bathing;Toileting OT Transfers Functional Problem(s): Toilet;Tub/Shower OT Additional Impairment(s): Fuctional Use of Upper Extremity OT Plan OT Intensity: Minimum of 1-2 x/day, 45 to 90 minutes OT Frequency: 5 out of 7 days OT Duration/Estimated Length of Stay: 3 weeks OT Treatment/Interventions: Balance/vestibular training;Discharge planning;Functional electrical stimulation;Self Care/advanced ADL retraining;Therapeutic Activities;UE/LE Coordination activities;Pain management;Cognitive remediation/compensation;Disease mangement/prevention;Functional mobility training;Skin care/wound managment;Patient/family education;Therapeutic Exercise;Visual/perceptual remediation/compensation;Community reintegration;Neuromuscular re-education;DME/adaptive equipment instruction;Psychosocial support;Splinting/orthotics;UE/LE Strength taining/ROM;Wheelchair propulsion/positioning OT Self Feeding Anticipated Outcome(s): supervision OT Basic Self-Care Anticipated Outcome(s): min assist OT Toileting Anticipated Outcome(s): min assist OT Bathroom Transfers Anticipated Outcome(s): min assist OT Recommendation Patient destination: Home Follow Up Recommendations: Home health OT;24 hour supervision/assistance Equipment Recommended: To be  determined   OT Evaluation Precautions/Restrictions  Precautions Precautions: Fall Restrictions Weight Bearing Restrictions: Yes RLE Weight Bearing: Weight bearing as tolerated General Chart Reviewed: Yes Pain Pain Assessment Pain Scale: 0-10 Pain Score: 0-No pain Home Living/Prior Functioning Home Living Family/patient expects to be discharged to:: Private residence Living Arrangements: Spouse/significant other Available Help at Discharge: Family, Available 24 hours/day Type of Home: House Home Access: Stairs to enter Technical brewer of Steps: 3 Entrance Stairs-Rails: None Home Layout: One level Bathroom Shower/Tub: Government social research officer Accessibility: Yes Additional Comments: ADL and IADLs history and home living environment primarily taken from chart review due to pt exhibiting expressive and memory impairments  Lives With: Spouse IADL History Education: Surveyor, minerals to college Prior Function  Able to Take Stairs?: Yes Driving: No Vocation: Retired Leisure: Hobbies-no (watching TV) Vision Baseline Vision/History: 1 Wears glasses (History gathered from husband. Pt wears readers but has baseline visual deficits from prior CVA's) Ability to See in Adequate Light: 1 Impaired Patient Visual Report: No change from baseline Vision Assessment?: Vision impaired- to be further tested in functional context;Yes Ocular Range of Motion: Restricted on the left;Restricted looking up;Restricted looking down;Restricted on the right Alignment/Gaze Preference: Gaze right Tracking/Visual Pursuits: Left eye does not track laterally;Right eye does not track medially;Decreased smoothness of vertical tracking;Decreased smoothness of horizontal tracking;Unable to hold eye position out of midline;Requires cues, head turns, or add eye shifts to track Saccades: Decreased speed of saccadic movement;Additional eye shifts occurred during testing;Undershoots Convergence:  Impaired - to be further tested in functional context Perception  Perception: Impaired Inattention/Neglect: Does not attend to left visual field;Does not attend to right side of body Praxis Praxis: Impaired Praxis Impairment Details: Initiation;Motor planning Cognition Cognition Overall Cognitive Status: History of cognitive impairments - at baseline Arousal/Alertness: Awake/alert Orientation Level: Place;Person;Situation Person: Oriented Place: Oriented Situation: Oriented Memory: Impaired Memory Impairment: Retrieval deficit;Decreased recall of new information Attention: Focused;Sustained Focused Attention: Impaired Focused Attention Impairment: Verbal basic;Functional basic Sustained Attention: Impaired Sustained Attention Impairment: Verbal basic;Functional basic Awareness: Impaired Awareness Impairment: Intellectual impairment;Emergent impairment Problem Solving: Impaired Problem Solving Impairment: Verbal basic;Functional basic Executive Function: Sequencing;Initiating;Self Correcting;Decision Making Sequencing: Impaired Sequencing Impairment: Functional basic Decision Making: Impaired Decision Making Impairment: Functional basic;Verbal basic Initiating: Impaired Initiating Impairment: Functional basic Self Correcting: Impaired Self Correcting Impairment: Functional basic Behaviors: Other (comment) Safety/Judgment: Impaired Comments: Pt attempting to stand  in // bars without PT assistance or instruction Brief Interview  for Mental Status (BIMS) Repetition of Three Words (First Attempt): 3 Temporal Orientation: Year: No answer ("I dont know") Temporal Orientation: Month: Missed by 6 days to 1 month Temporal Orientation: Day: No answer ("I dont know") Recall: "Sock": No, could not recall Recall: "Blue": No, could not recall Recall: "Bed": No, could not recall BIMS Summary Score: 4 Sensation Sensation Light Touch: Impaired by gross assessment Hot/Cold: Appears  Intact Proprioception: Impaired by gross assessment Stereognosis: Impaired by gross assessment Coordination Gross Motor Movements are Fluid and Coordinated: No Fine Motor Movements are Fluid and Coordinated: No Finger Nose Finger Test: unable to isolate digits to complete with precision Heel Shin Test: UTA due to hip flexor weakness Motor  Motor Motor: Hemiplegia Motor - Skilled Clinical Observations: 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  Trunk/Postural Assessment  Cervical Assessment Cervical Assessment: Exceptions to [facility] (forward head) Thoracic Assessment Thoracic Assessment: Exceptions to [facility] (rounded shoulders) Lumbar Assessment Lumbar Assessment: Exceptions to [facility] (posterior pelvic tilt) Postural Control Postural Control: Deficits on evaluation (inadequate)  Balance Balance Balance Assessed: Yes Static Sitting Balance Static Sitting - Balance Support: No upper extremity supported Static Sitting - Level of Assistance: 5: Stand by assistance Dynamic Sitting Balance Dynamic Sitting - Balance Support: Bilateral upper extremity supported Dynamic Sitting - Level of Assistance: 4: Min Insurance risk surveyor Standing - Balance Support: Bilateral upper extremity supported (// bars) Static Standing - Level of Assistance: 3: Mod assist Dynamic Standing Balance Dynamic Standing - Balance Support: Bilateral upper extremity supported Dynamic Standing - Level of Assistance: 2: Max assist (// bars) Extremity/Trunk Assessment RUE Assessment RUE Assessment: Exceptions to [facility] RUE Body System: Neuro Brunstrum levels for arm and hand: Arm;Hand Brunstrum level for arm: Stage II Synergy is developing Brunstrum level for hand: Stage II Synergy is developing LUE Assessment General Strength Comments: 3+/5  Care Tool Care Tool Self Care Eating   Eating Assist Level: Supervision/Verbal cueing    Oral Care    Oral Care Assist Level: Minimal Assistance -  Patient > 75%    Bathing         Assist Level: Maximal Assistance - Patient 24 - 49%    Upper Body Dressing(including orthotics)       Assist Level: Minimal Assistance - Patient > 75%    Lower Body Dressing (excluding footwear)     Assist for lower body dressing: Dependent - Patient 0%    Putting on/Taking off footwear     Assist for footwear: Dependent - Patient 0%       Care Tool Toileting Toileting activity   Assist for toileting: Dependent - Patient 0%     Care Tool Bed Mobility Roll left and right activity        Sit to lying activity        Lying to sitting on side of bed activity         Care Tool Transfers Sit to stand transfer        Chair/bed transfer         Toilet transfer   Assist Level: 2 Helpers     Care Tool Cognition  Expression of Ideas and Wants Expression of Ideas and Wants: 3. Some difficulty - exhibits some difficulty with expressing needs and ideas (e.g, some words or finishing thoughts) or speech is not clear  Understanding Verbal and Non-Verbal Content Understanding Verbal and Non-Verbal Content: 3. Usually understands - understands most conversations, but misses some part/intent of message. Requires cues at  times to understand   Memory/Recall Ability Memory/Recall Ability : That he or she is in a hospital/hospital unit   Refer to Care Plan for Long Term Goals  SHORT TERM GOAL WEEK 1 OT Short Term Goal 1 (Week 1): Pt will complete sit<>stand while following WB precautions with mod assist in prep for self care. OT Short Term Goal 2 (Week 1): Pt will complete toilet transfer with mod assist OT Short Term Goal 3 (Week 1): Pt will donn/doff pants with max assist at sit<>stand level.  Recommendations for other services: None    Skilled Therapeutic Intervention ADL ADL Grooming: Minimal assistance Where Assessed-Grooming: Edge of bed Upper Body Bathing: Minimal assistance Where Assessed-Upper Body Bathing: Edge of bed Lower  Body Bathing: Maximal assistance Where Assessed-Lower Body Bathing: Edge of bed;Bed level Upper Body Dressing: Minimal assistance Where Assessed-Upper Body Dressing: Edge of bed Lower Body Dressing: Dependent Where Assessed-Lower Body Dressing: Bed level Toileting: Dependent Where Assessed-Toileting: Bed level Mobility  Bed Mobility Bed Mobility: Rolling Right;Rolling Left;Right Sidelying to Sit;Sit to Supine Rolling Right: Moderate Assistance - Patient 50-74% Rolling Left: Moderate Assistance - Patient 50-74% Right Sidelying to Sit: Moderate Assistance - Patient 50-74% Sit to Supine: Moderate Assistance - Patient 50-74% Transfers Sit to Stand: Moderate Assistance - Patient 50-74% Stand to Sit: Moderate Assistance - Patient 50-74%   Discharge Criteria: Patient will be discharged from OT if patient refuses treatment 3 consecutive times without medical reason, if treatment goals not met, if there is a change in medical status, if patient makes no progress towards goals or if patient is discharged from hospital.  The above assessment, treatment plan, treatment alternatives and goals were discussed and mutually agreed upon: by patient  Ezekiel Slocumb 02/03/2022, 1:53 PM

## 2022-02-03 NOTE — Progress Notes (Signed)
Inpatient Rehabilitation  Patient information reviewed and entered into eRehab system by Jessina Marse Skylynn Burkley, OTR/L.   Information including medical coding, functional ability and quality indicators will be reviewed and updated through discharge.    

## 2022-02-03 NOTE — Progress Notes (Signed)
Patient ID: [name] Gilbert, female   DOB: March 28, 1961, 61 y.o.   MRN: [id number]  Met with husband who is here and feeling ok. Encouraged him not to overdo it here and go home and rest since he was discharged from the hospital yesterday. Discussed his wife will need 24/7 care at discharge and he will need to see if their home is wheelchair accessible since she is NWB on her good side her left side is weak from her past CVA's and she relied upon her right side for the majority of movement and support. He will measure the doorways at home and let PT know. Will work together on the safest discharge plan for her.

## 2022-02-03 NOTE — Progress Notes (Signed)
PROGRESS NOTE   Subjective/Complaints: No new complaints this morning Reviewed labs with her Code status changed as per patient's preferences  ROS: Denies pain   Objective:   No results found. Recent Labs    02/02/22 1529 02/03/22 0519  WBC 5.9 6.1  HGB 13.2 12.0  HCT 40.4 36.2  PLT 339 359   Recent Labs    02/02/22 0705 02/02/22 1529 02/03/22 0519  NA 134*  --  134*  K 5.2*  --  4.5  CL 102  --  102  CO2 23  --  22  GLUCOSE 120*  --  142*  BUN 37*  --  37*  CREATININE 1.96* 2.16* 2.16*  CALCIUM 8.5*  --  8.5*    Intake/Output Summary (Last 24 hours) at 02/03/2022 1131 Last data filed at 02/03/2022 0755 Gross per 24 hour  Intake 480 ml  Output --  Net 480 ml        Physical Exam: Vital Signs Blood pressure 121/74, pulse 71, temperature 98.5 F (36.9 C), temperature source Oral, resp. rate 18, height 5' 2.01" (1.575 m), weight 76.2 kg, SpO2 100 %. Gen: no distress, normal appearing HEENT: oral mucosa pink and moist, NCAT    Mouth: Mucous membranes are dry.     Pharynx: Oropharynx is clear. No oropharyngeal exudate.  Eyes:     General:        Right eye: No discharge.        Left eye: No discharge.     Comments: R gaze preference- unable to look left past midline- no nystagmus seen  Cardiovascular:     Rate and Rhythm: Normal rate and regular rhythm.     Heart sounds: Normal heart sounds. No murmur heard.   No gallop.  Pulmonary:     Effort: Pulmonary effort is normal. No respiratory distress.     Breath sounds: Normal breath sounds. No wheezing, rhonchi or rales.  Abdominal:     Comments: Soft, NT, ND; very hypoactive BS  Musculoskeletal:        General: Swelling present.     Cervical back: Neck supple. No tenderness.     Comments: RUE 3-/5 in biceps, triceps, grip and 1/5 in FA LUE- Biceps 2/5; triceps 3-/5; grip 3-/5 and FA 3-/5 RLE- HF 2-/5; KE 1/5; cannot wiggle toes below cast with ACE  wrap LLE- HF 2-/5; KE 2-/5; DF 0/5 and PF 2+/5   However spontaneous movement in R side and L side better than tested when reached for remote to use it  Skin:    Comments: RLE cast from toes to just below knee with half cast; half ACE wrap No skin breakdown seen  Neurological:     Mental Status: She is alert.     Comments: Patient is alert.  No acute distress.  She does display mild to moderate expressive receptive aphasia. Moderate aphasia noted Decreased to light touch per pt on B/L sides- no difference between both sides  Psychiatric:     Comments: So extremely flat affect and facies    Assessment/Plan: 1. Functional deficits which require 3+ hours per day of interdisciplinary therapy in a comprehensive inpatient rehab setting. Physiatrist  is providing close team supervision and 24 hour management of active medical problems listed below. Physiatrist and rehab team continue to assess barriers to discharge/monitor patient progress toward functional and medical goals  Care Tool:  Bathing              Bathing assist       Upper Body Dressing/Undressing Upper body dressing        Upper body assist      Lower Body Dressing/Undressing Lower body dressing            Lower body assist       Toileting Toileting    Toileting assist       Transfers Chair/bed transfer  Transfers assist           Locomotion Ambulation   Ambulation assist              Walk 10 feet activity   Assist           Walk 50 feet activity   Assist           Walk 150 feet activity   Assist           Walk 10 feet on uneven surface  activity   Assist           Wheelchair     Assist               Wheelchair 50 feet with 2 turns activity    Assist            Wheelchair 150 feet activity     Assist          Blood pressure 121/74, pulse 71, temperature 98.5 F (36.9 C), temperature source Oral, resp. rate 18,  height 5' 2.01" (1.575 m), weight 76.2 kg, SpO2 100 %.    Medical Problem List and Plan: 1. Functional deficits secondary to large acute to subacute infarct posterior medial left temporal and occipital lobe and left thalamus in the setting of multiple intracranial stenosis as well as history of CVA with left-sided weakness/aphasia             -patient may  shower- bag RLE             -ELOS/Goals: 14-16 days- min A to supervision  Initial CIR evaluations today.  2.  Antithrombotics: -DVT/anticoagulation:  Pharmaceutical: Lovenox             -antiplatelet therapy: Aspirin 81 mg daily and Plavix 75 mg daily x90 days then aspirin alone 3. Pain Management: Lyrica 75 mg twice daily, hydrocodone as needed 4. Mood: Provide emotional support             -antipsychotic agents: N/A 5. Neuropsych: This patient is?capable of making decisions on her own behalf. 6. Skin/Wound Care: Routine skin checks 7. Fluids/Electrolytes/Nutrition: Routine in and outs with follow-up chemistries 8.  Right distal fibular metaphysis fracture with minimal lateral displacement.  Nonsurgical.  Follow-up orthopedic services Dr.Poggi.  Nonweightbearing right lower extremity 9.  Diabetes mellitus.  Hemoglobin A1c 11.1. Increase Semglee to 19 units daily.  Check blood sugars before meals and at bedtime 10.  Seizure prophylaxis.  Keppra 250 mg twice daily. 11.  Permissive hypertension.  Presently on HCTZ 25 mg daily.  Patient on amlodipine 10 mg daily, hydralazine 25 mg 3 times daily, Coreg 25 mg twice daily, Aldactone 150 mg daily PTA.  Resume as needed 12.  Diastolic congestive heart failure.  Monitor for any signs of fluid  overload 13.  AKI on CKD stage III.  Creatinine baseline 1.4-1.7.  Follow-up chemistries 14.  Hyperlipidemia.  Crestor 15. Constipation- LBM 3-4 days ago- might need additional intervention- hasn't been eating much per pt/nursing.  16. Obesity: BMI 30.72: provide list of foods to help with weight loss.  17.  Low protein: discussed choosing high protein and low added sugar foods   LOS: 1 days A FACE TO FACE EVALUATION WAS PERFORMED  [name] Gilbert 02/03/2022, 11:31 AM

## 2022-02-03 NOTE — Plan of Care (Signed)
  Problem: RH Grooming Goal: LTG Patient will perform grooming w/assist,cues/equip (OT) Description: LTG: Patient will perform grooming with assist, with/without cues using equipment (OT) Flowsheets (Taken 02/03/2022 1355) LTG: Pt will perform grooming with assistance level of: Supervision/Verbal cueing   Problem: RH Bathing Goal: LTG Patient will bathe all body parts with assist levels (OT) Description: LTG: Patient will bathe all body parts with assist levels (OT) Flowsheets (Taken 02/03/2022 1355) LTG: Pt will perform bathing with assistance level/cueing: Minimal Assistance - Patient > 75% LTG: Position pt will perform bathing: Shower   Problem: RH Dressing Goal: LTG Patient will perform upper body dressing (OT) Description: LTG Patient will perform upper body dressing with assist, with/without cues (OT). Flowsheets (Taken 02/03/2022 1355) LTG: Pt will perform upper body dressing with assistance level of: Supervision/Verbal cueing Goal: LTG Patient will perform lower body dressing w/assist (OT) Description: LTG: Patient will perform lower body dressing with assist, with/without cues in positioning using equipment (OT) Flowsheets (Taken 02/03/2022 1355) LTG: Pt will perform lower body dressing with assistance level of: Minimal Assistance - Patient > 75%   Problem: RH Toileting Goal: LTG Patient will perform toileting task (3/3 steps) with assistance level (OT) Description: LTG: Patient will perform toileting task (3/3 steps) with assistance level (OT)  Flowsheets (Taken 02/03/2022 1355) LTG: Pt will perform toileting task (3/3 steps) with assistance level: Minimal Assistance - Patient > 75%   Problem: RH Functional Use of Upper Extremity Goal: LTG Patient will use RT/LT upper extremity as a (OT) Description: LTG: Patient will use right/left upper extremity as a stabilizer/gross assist/diminished/nondominant/dominant level with assist, with/without cues during functional activity  (OT) Flowsheets (Taken 02/03/2022 1355) LTG: Use of upper extremity in functional activities: RUE as nondominant level LTG: Pt will use upper extremity in functional activity with assistance level of: Supervision/Verbal cueing   Problem: RH Toilet Transfers Goal: LTG Patient will perform toilet transfers w/assist (OT) Description: LTG: Patient will perform toilet transfers with assist, with/without cues using equipment (OT) Flowsheets (Taken 02/03/2022 1355) LTG: Pt will perform toilet transfers with assistance level of: Minimal Assistance - Patient > 75%   Problem: RH Tub/Shower Transfers Goal: LTG Patient will perform tub/shower transfers w/assist (OT) Description: LTG: Patient will perform tub/shower transfers with assist, with/without cues using equipment (OT) Flowsheets (Taken 02/03/2022 1355) LTG: Pt will perform tub/shower stall transfers with assistance level of: Minimal Assistance - Patient > 75%

## 2022-02-04 DIAGNOSIS — I6381 Other cerebral infarction due to occlusion or stenosis of small artery: Secondary | ICD-10-CM | POA: Diagnosis not present

## 2022-02-04 LAB — GLUCOSE, CAPILLARY
Glucose-Capillary: 88 mg/dL (ref 70–99)
Glucose-Capillary: 102 mg/dL — ABNORMAL HIGH (ref 70–99)
Glucose-Capillary: 216 mg/dL — ABNORMAL HIGH (ref 70–99)
Glucose-Capillary: 183 mg/dL — ABNORMAL HIGH (ref 70–99)

## 2022-02-04 MED ORDER — SORBITOL 70 % SOLN
30.0000 mL | Freq: Once | Status: AC
  Administered 2022-02-04: 30 mL via ORAL
  Filled 2022-02-04: qty 30

## 2022-02-04 NOTE — Plan of Care (Signed)
  Problem: Consults Goal: RH STROKE PATIENT EDUCATION Description: See Patient Education module for education specifics  Outcome: Progressing   Problem: RH BOWEL ELIMINATION Goal: RH STG MANAGE BOWEL WITH ASSISTANCE Description: STG Manage Bowel with  mod I Assistance. Outcome: Progressing Goal: RH STG MANAGE BOWEL W/MEDICATION W/ASSISTANCE Description: STG Manage Bowel with Medication with mod I Assistance. Outcome: Progressing   Problem: RH BLADDER ELIMINATION Goal: RH STG MANAGE BLADDER WITH ASSISTANCE Description: STG Manage Bladder With toileting Assistance Outcome: Progressing   Problem: RH SAFETY Goal: RH STG ADHERE TO SAFETY PRECAUTIONS W/ASSISTANCE/DEVICE Description: STG Adhere to Safety Precautions With cues Assistance/Device. Outcome: Progressing   Problem: RH KNOWLEDGE DEFICIT Goal: RH STG INCREASE KNOWLEDGE OF DIABETES Description: Patient and spouse will be able to manage DM with medications and dietary modifications using handouts and educational materials independently Outcome: Progressing Goal: RH STG INCREASE KNOWLEDGE OF HYPERTENSION Description: Patient and spouse will be able to manage HTN with medications and dietary modifications using handouts and educational materials independently Outcome: Progressing Goal: RH STG INCREASE KNOWLEGDE OF HYPERLIPIDEMIA Description: Patient and spouse will be able to manage HLD with medications and dietary modifications using handouts and educational materials independently Outcome: Progressing Goal: RH STG INCREASE KNOWLEDGE OF STROKE PROPHYLAXIS Outcome: Progressing

## 2022-02-04 NOTE — Progress Notes (Signed)
PROGRESS NOTE   Subjective/Complaints:   Pt reports ok- no issues.  Per nursing, needs to have BM- will order Sorbitol.   ROS:  Pt denies SOB, abd pain, CP, N/V/C/D, and vision changes    Objective:   No results found. Recent Labs    02/02/22 1529 02/03/22 0519  WBC 5.9 6.1  HGB 13.2 12.0  HCT 40.4 36.2  PLT 339 359   Recent Labs    02/02/22 0705 02/02/22 1529 02/03/22 0519  NA 134*  --  134*  K 5.2*  --  4.5  CL 102  --  102  CO2 23  --  22  GLUCOSE 120*  --  142*  BUN 37*  --  37*  CREATININE 1.96* 2.16* 2.16*  CALCIUM 8.5*  --  8.5*    Intake/Output Summary (Last 24 hours) at 02/04/2022 1057 Last data filed at 02/04/2022 0700 Gross per 24 hour  Intake 650 ml  Output --  Net 650 ml        Physical Exam: Vital Signs Blood pressure (!) 142/69, pulse 68, temperature 98 F (36.7 C), resp. rate 18, height 5' 2.01" (1.575 m), weight 76.2 kg, SpO2 100 %.   General: awake, alert, laying in bed; aphasic; NAD HENT: R gaze preference- cannot look L past midline oropharynx moist CV: regular rate; no JVD Pulmonary: CTA B/L; no W/R/R- good air movement GI: soft, distended; NT; hypoactive BS Psychiatric: appropriate Neurological: aphasic Musculoskeletal:        General: Swelling present.     Cervical back: Neck supple. No tenderness.     Comments: RUE 3-/5 in biceps, triceps, grip and 1/5 in FA LUE- Biceps 2/5; triceps 3-/5; grip 3-/5 and FA 3-/5 RLE- HF 2-/5; KE 1/5; cannot wiggle toes below cast with ACE wrap LLE- HF 2-/5; KE 2-/5; DF 0/5 and PF 2+/5   However spontaneous movement in R side and L side better than tested when reached for remote to use it  Skin:    Comments: RLE cast from toes to just below knee with half cast; half ACE wrap No skin breakdown seen  Neurological:     Mental Status: She is alert.     Comments: Patient is alert.  No acute distress.  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 Moderate aphasia noted Decreased to light touch per pt on B/L sides- no difference between both sides  Psychiatric:     Comments: So extremely flat affect and facies    Assessment/Plan: 1. Functional deficits which require 3+ hours per day of interdisciplinary therapy in a comprehensive inpatient rehab setting. Physiatrist is providing close team supervision and 24 hour management of active medical problems listed below. Physiatrist and rehab team continue to assess barriers to discharge/monitor patient progress toward functional and medical goals  Care Tool:  Bathing              Bathing assist Assist Level: Maximal Assistance - Patient 24 - 49%     Upper Body Dressing/Undressing Upper body dressing        Upper body assist Assist Level: Minimal Assistance - Patient > 75%    Lower Body Dressing/Undressing  Lower body dressing            Lower body assist Assist for lower body dressing: Dependent - Patient 0%     Toileting Toileting    Toileting assist Assist for toileting: Dependent - Patient 0%     Transfers Chair/bed transfer  Transfers assist     Chair/bed transfer assist level: Maximal Assistance - Patient 25 - 49%     Locomotion Ambulation   Ambulation assist   Ambulation activity did not occur: Safety/medical concerns          Walk 10 feet activity   Assist  Walk 10 feet activity did not occur: Safety/medical concerns        Walk 50 feet activity   Assist Walk 50 feet with 2 turns activity did not occur: Safety/medical concerns         Walk 150 feet activity   Assist Walk 150 feet activity did not occur: Safety/medical concerns         Walk 10 feet on uneven surface  activity   Assist Walk 10 feet on uneven surfaces activity did not occur: Safety/medical concerns         Wheelchair     Assist Is the patient using a wheelchair?: Yes Type of Wheelchair: Manual     Wheelchair assist level: Dependent - Patient 0%      Wheelchair 50 feet with 2 turns activity    Assist        Assist Level: Dependent - Patient 0%   Wheelchair 150 feet activity     Assist      Assist Level: Dependent - Patient 0%   Blood pressure (!) 142/69, pulse 68, temperature 98 F (36.7 C), resp. rate 18, height 5' 2.01" (1.575 m), weight 76.2 kg, SpO2 100 %.    Medical Problem List and Plan: 1. 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  Antithrombotics: -DVT/anticoagulation:  Pharmaceutical: Lovenox             -antiplatelet therapy: Aspirin 81 mg daily and Plavix 75 mg daily x90 days then aspirin alone 3. Pain Management: Lyrica 75 mg twice daily, hydrocodone as needed  5/27- denies pain after pain meds- con't regimen 4. Mood: Provide emotional support             -antipsychotic agents: N/A 5. Neuropsych: This patient is?capable of making decisions on her own behalf. 6. Skin/Wound Care: Routine skin checks 7. Fluids/Electrolytes/Nutrition: Routine in and outs with follow-up chemistries 8.  Right distal fibular metaphysis fracture with minimal lateral displacement.  Nonsurgical.  Follow-up orthopedic services Dr.Poggi.  Nonweightbearing right lower extremity 9.  Diabetes mellitus.  Hemoglobin A1c 11.1. Increase Semglee to 19 units daily.  Check blood sugars before meals and at bedtime  5/27- CBGs 72-155- doing much better- monitor for hypoglycemia 10.  Seizure prophylaxis.  Keppra 250 mg twice daily. 11.  Permissive hypertension.  Presently on HCTZ 25 mg daily.  Patient on amlodipine 10 mg daily, hydralazine 25 mg 3 times daily,  Coreg 25 mg twice daily, Aldactone 150 mg daily PTA.  Resume as needed  5/27- BP very slightly elevated in 140s- con't regimen for now 12.  Diastolic congestive heart failure.  Monitor for any signs of fluid overload  5/27- will check daily weights- will order 13.  AKI on CKD stage III.  Creatinine baseline 1.4-1.7.  Follow-up chemistries 14.  Hyperlipidemia.  Crestor 15. Constipation- LBM 3-4 days ago- might need additional intervention- hasn't been eating much per pt/nursing.   5/27- will order Sorbitol after therapy today- no BM in days 16. Obesity: BMI 30.72: provide list of foods to help with weight loss.  17. Low protein: discussed choosing high protein and low added sugar foods    LOS: 2 days A FACE TO FACE EVALUATION WAS PERFORMED  [name] Gilbert 02/04/2022, 10:57 AM

## 2022-02-05 DIAGNOSIS — I6381 Other cerebral infarction due to occlusion or stenosis of small artery: Secondary | ICD-10-CM | POA: Diagnosis not present

## 2022-02-05 LAB — GLUCOSE, CAPILLARY
Glucose-Capillary: 176 mg/dL — ABNORMAL HIGH (ref 70–99)
Glucose-Capillary: 153 mg/dL — ABNORMAL HIGH (ref 70–99)
Glucose-Capillary: 228 mg/dL — ABNORMAL HIGH (ref 70–99)
Glucose-Capillary: 287 mg/dL — ABNORMAL HIGH (ref 70–99)

## 2022-02-05 NOTE — IPOC Note (Signed)
Overall Plan of Care [facility]) Patient Details Name: [name] Gilbert MRN: [id number] DOB: [birth year]  Admitting Diagnosis: Left thalamic infarction [facility])  Hospital Problems: Principal Problem:   Left thalamic infarction [facility])     Functional Problem List: Nursing Bladder, Bowel, Pain, Endurance, Motor, Safety, Medication Management  PT Balance, Motor, Endurance, Safety, Sensory, Skin Integrity, Pain  OT Balance, Perception, Cognition, Safety, Sensory, Skin Integrity, Edema, Endurance, Vision, Motor  SLP Cognition, Motor  TR         Basic ADL's: OT Grooming, Dressing, Bathing, Toileting     Advanced  ADL's: OT       Transfers: PT Bed Mobility, Bed to Chair, Customer service manager, Tub/Shower     Locomotion: PT Ambulation, Psychologist, prison and probation services, Stairs     Additional Impairments: OT Fuctional Use of Upper Extremity  SLP Swallowing, Communication, Social Cognition expression Problem Solving, Memory, Attention, Awareness  TR      Anticipated Outcomes Item Anticipated Outcome  Self Feeding supervision  Swallowing  Sup A   Basic self-care  min assist  Toileting  min assist   Bathroom Transfers min assist  Bowel/Bladder  manage bowel w mod I and bladder w toileting  Transfers  minA  Locomotion  Anticipate primarily wheelchair level at discharge  Communication  Sup A  Cognition  Min A  Pain  pain at or below level 4 with prns  Safety/Judgment  maintain safety w cues   Therapy Plan: PT Intensity: Minimum of 1-2 x/day ,45 to 90 minutes PT Frequency: 5 out of 7 days PT Duration Estimated Length of Stay: 2-3 weeks OT Intensity: Minimum of 1-2 x/day, 45 to 90 minutes OT Frequency: 5 out of 7 days OT Duration/Estimated Length of Stay: 3 weeks SLP Intensity: Minumum of 1-2 x/day, 30 to 90 minutes SLP Frequency: 3 to 5 out of 7 days SLP Duration/Estimated Length of Stay: 2 weeks   Team Interventions: Nursing Interventions Bladder Management, Disease  Management/Prevention, Medication Management, Discharge Planning, Pain Management, Bowel Management, Patient/Family Education, Skin Care/Wound Management  PT interventions Ambulation/gait training, Discharge planning, Functional mobility training, Psychosocial support, Therapeutic Activities, Visual/perceptual remediation/compensation, Wheelchair propulsion/positioning, Therapeutic Exercise, Skin care/wound management, Neuromuscular re-education, Disease management/prevention, Warden/ranger, Cognitive remediation/compensation, DME/adaptive equipment instruction, Splinting/orthotics, Pain management, UE/LE Strength taining/ROM, UE/LE Coordination activities, Stair training, Patient/family education, Functional electrical stimulation, Community reintegration  OT Interventions Warden/ranger, Discharge planning, Functional electrical stimulation, Self Care/advanced ADL retraining, Therapeutic Activities, UE/LE Coordination activities, Pain management, Cognitive remediation/compensation, Disease mangement/prevention, Functional mobility training, Skin care/wound managment, Patient/family education, Therapeutic Exercise, Visual/perceptual remediation/compensation, Community reintegration, Neuromuscular re-education, DME/adaptive equipment instruction, Psychosocial support, Splinting/orthotics, UE/LE Strength taining/ROM, Wheelchair propulsion/positioning  SLP Interventions Functional tasks, Cueing hierarchy, Dysphagia/aspiration precaution training, Multimodal communication approach, Patient/family education, Therapeutic Activities, Speech/Language facilitation  TR Interventions    SW/CM Interventions Discharge Planning, Psychosocial Support, Patient/Family Education   Barriers to Discharge MD  Medical stability, Home enviroment access/loayout, Wound care, Lack of/limited family support, Weight bearing restrictions, and Behavior  Nursing Home environment access/layout, Decreased  caregiver support 1 level 3 ste no rails w spouse; dtr to assist as needed  PT Decreased caregiver support, Home environment access/layout, Insurance for SNF coverage, Weight bearing restrictions, Lack of/limited family support, Incontinence    OT Incontinence, Weight bearing restrictions, Weight    SLP      SW Decreased caregiver support, Community education officer for SNF coverage, Weight bearing restrictions     Team Discharge Planning: Destination: PT-Home ,OT- Home , SLP-Home Projected Follow-up: PT-Home health PT, 24 hour supervision/assistance, OT-  Home health OT, 24 hour supervision/assistance, SLP-Outpatient SLP, 24 hour supervision/assistance Projected Equipment Needs: PT-To be determined, OT- To be determined, SLP-None recommended by SLP Equipment Details: PT- , OT-  Patient/family involved in discharge planning: PT- Patient, Family member/caregiver,  OT-Patient, SLP-Patient  MD ELOS: 2-3 weeks Medical Rehab Prognosis:  Good Assessment:  The patient has been admitted for CIR therapies with the diagnosis of L thalamic infarct with R distal fibular fx- NWB. The team will be addressing functional mobility, strength, stamina, balance, safety, adaptive techniques and equipment, self-care, bowel and bladder mgt, patient and caregiver education, WB status. Goals have been set at min A. Anticipated discharge destination is home.        See Team Conference Notes for weekly updates to the plan of care

## 2022-02-05 NOTE — Progress Notes (Signed)
Occupational Therapy Session Note  Patient Details  Name: [name] Gilbert MRN: [id number] Date of Birth: 1961/06/06  Today's Date: 02/05/2022 OT Individual Time: 1107-1205 OT Individual Time Calculation (min): 58 min    Short Term Goals: Week 1:  OT Short Term Goal 1 (Week 1): Pt will complete sit<>stand while following WB precautions with mod assist in prep for self care. OT Short Term Goal 2 (Week 1): Pt will complete toilet transfer with mod assist OT Short Term Goal 3 (Week 1): Pt will donn/doff pants with max assist at sit<>stand level.  Skilled Therapeutic Interventions/Progress Updates:   Patient received sleeping supine.  Patient slow to arouse but willing to get up for therapy. Skilled OT intervention with emphasis on postural control, neuromuscular reeducation to Right side, attention/initiation, and sensory awareness.  Patient able to sit edge of bed without physical support - with close supervision.  Patient bathed self at edge of bed - lateral leans to doff shorts/brief.  Patient able to transition to partial stand to pull shorts up over hips using walker for support and slightly elevated bed surface.  Patient spontaneously using RUE at times, but also needed cueing to reposition from awkward position - eg extreme flexed wrist, or behind body.   Patient transferred to wheelchair toward right with mod assist - squat pivot, and left up in wheelchair with safety belt in place, call bell in reach, and husband in room.    Therapy Documentation Precautions:  Precautions Precautions: Fall Restrictions Weight Bearing Restrictions: Yes RLE Weight Bearing: Non weight bearing    Pain: Pain Assessment Pain Scale: 0-10 Pain Score: 0-No pain     Therapy/Group: Individual Therapy  Mariah Milling 02/05/2022, 12:55 PM

## 2022-02-05 NOTE — Progress Notes (Signed)
PROGRESS NOTE   Subjective/Complaints:  LBM- large yesterday Afternoon- doing better.  Doing "OK";  denies pain.     ROS:   Pt denies SOB, abd pain, CP, N/V/C/D, and vision changes   Objective:   No results found. Recent Labs    02/02/22 1529 02/03/22 0519  WBC 5.9 6.1  HGB 13.2 12.0  HCT 40.4 36.2  PLT 339 359   Recent Labs    02/02/22 1529 02/03/22 0519  NA  --  134*  K  --  4.5  CL  --  102  CO2  --  22  GLUCOSE  --  142*  BUN  --  37*  CREATININE 2.16* 2.16*  CALCIUM  --  8.5*    Intake/Output Summary (Last 24 hours) at 02/05/2022 0953 Last data filed at 02/05/2022 0418 Gross per 24 hour  Intake 178 ml  Output 0 ml  Net 178 ml        Physical Exam: Vital Signs Blood pressure 120/76, pulse 69, temperature 98.5 F (36.9 C), temperature source Oral, resp. rate 18, height 5' 2.01" (1.575 m), weight 76.2 kg, SpO2 100 %.    General: awake, alert, appropriate, supine in bed; NAD HENT:  oropharynx moist- R gaze preference- was looking more midline this AM CV: regular rate; no JVD Pulmonary: CTA B/L; no W/R/R- good air movement GI: soft, NT, ND, (+)BS Psychiatric: appropriate Neurological: aphasic Musculoskeletal:        General: Swelling present.     Cervical back: Neck supple. No tenderness.     Comments: RUE 3-/5 in biceps, triceps, grip and 1/5 in FA LUE- Biceps 2/5; triceps 3-/5; grip 3-/5 and FA 3-/5 RLE- HF 2-/5; KE 1/5; cannot wiggle toes below cast with ACE wrap LLE- HF 2-/5; KE 2-/5; DF 0/5 and PF 2+/5   However spontaneous movement in R side and L side better than tested when reached for remote to use it  Skin:    Comments: RLE cast from toes to just below knee with half cast; half ACE wrap No skin breakdown seen  Neurological:     Mental Status: She is alert.     Comments: Patient is alert.  No acute distress.  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 Moderate aphasia noted Decreased to light touch per pt on B/L sides- no difference between both sides  Psychiatric:     Comments: So extremely flat affect and facies    Assessment/Plan: 1. Functional deficits which require 3+ hours per day of interdisciplinary therapy in a comprehensive inpatient rehab setting. Physiatrist is providing close team supervision and 24 hour management of active medical problems listed below. Physiatrist and rehab team continue to assess barriers to discharge/monitor patient progress toward functional and medical goals  Care Tool:  Bathing              Bathing assist Assist Level: Maximal Assistance - Patient 24 - 49%     Upper Body Dressing/Undressing Upper body dressing   What is the patient wearing?: Pull over shirt    Upper body assist Assist Level: Minimal Assistance - Patient > 75%    Lower Body Dressing/Undressing Lower  body dressing      What is the patient wearing?: Incontinence brief     Lower body assist Assist for lower body dressing: Dependent - Patient 0%     Toileting Toileting    Toileting assist Assist for toileting: Dependent - Patient 0%     Transfers Chair/bed transfer  Transfers assist     Chair/bed transfer assist level: Maximal Assistance - Patient 25 - 49%     Locomotion Ambulation   Ambulation assist   Ambulation activity did not occur: Safety/medical concerns          Walk 10 feet activity   Assist  Walk 10 feet activity did not occur: Safety/medical concerns        Walk 50 feet activity   Assist Walk 50 feet with 2 turns activity did not occur: Safety/medical concerns         Walk 150 feet activity   Assist Walk 150 feet activity did not occur: Safety/medical concerns         Walk 10 feet on uneven surface  activity   Assist Walk 10 feet on uneven surfaces activity did not occur: Safety/medical concerns         Wheelchair     Assist Is the  patient using a wheelchair?: Yes Type of Wheelchair: Manual    Wheelchair assist level: Dependent - Patient 0%      Wheelchair 50 feet with 2 turns activity    Assist        Assist Level: Dependent - Patient 0%   Wheelchair 150 feet activity     Assist      Assist Level: Dependent - Patient 0%   Blood pressure 120/76, pulse 69, temperature 98.5 F (36.9 C), temperature source Oral, resp. rate 18, height 5' 2.01" (1.575 m), weight 76.2 kg, SpO2 100 %.    Medical Problem List and Plan: 1. Functional deficits secondary to large acute to subacute infarct posterior medial left temporal and occipital lobe and left thalamus in the setting of multiple intracranial stenosis as well as history of CVA with left-sided weakness/aphasia             -patient may  shower- bag RLE             -ELOS/Goals: 14-16 days- min A to supervision  Con't CIR- PT, OT and SLP 2.  Antithrombotics: -DVT/anticoagulation:  Pharmaceutical: Lovenox             -antiplatelet therapy: Aspirin 81 mg daily and Plavix 75 mg daily x90 days then aspirin alone 3. Pain Management: Lyrica 75 mg twice daily, hydrocodone as needed  5/27- denies pain after pain meds- con't regimen 4. Mood: Provide emotional support             -antipsychotic agents: N/A 5. Neuropsych: This patient is?capable of making decisions on her own behalf. 6. Skin/Wound Care: Routine skin checks 7. Fluids/Electrolytes/Nutrition: Routine in and outs with follow-up chemistries 8.  Right distal fibular metaphysis fracture with minimal lateral displacement.  Nonsurgical.  Follow-up orthopedic services Dr.Poggi.  Nonweightbearing right lower extremity 9.  Diabetes mellitus.  Hemoglobin A1c 11.1. Increase Semglee to 19 units daily.  Check blood sugars before meals and at bedtime  5/27- CBGs 72-155- doing much better- monitor for hypoglycemia  5/28- CBGs 88-183- 1 value 216 before dinner- monitor trend 10.  Seizure prophylaxis.  Keppra 250 mg  twice daily. 11.  Permissive hypertension.  Presently on HCTZ 25 mg daily.  Patient on amlodipine 10 mg  daily, hydralazine 25 mg 3 times daily, Coreg 25 mg twice daily, Aldactone 150 mg daily PTA.  Resume as needed  5/27- BP very slightly elevated in 140s- con't regimen for now  5/28- BP 120s- controlled- con't regimen 12.  Diastolic congestive heart failure.  Monitor for any signs of fluid overload  5/27- will check daily weights- will order 13.  AKI on CKD stage III.  Creatinine baseline 1.4-1.7.  Follow-up chemistries 14.  Hyperlipidemia.  Crestor 15. Constipation- LBM 3-4 days ago- might need additional intervention- hasn't been eating much per pt/nursing.   5/27- will order Sorbitol after therapy today- no BM in days  5/28- Large BM after sorbitol yesterday afternoon- 16. Obesity: BMI 30.72: provide list of foods to help with weight loss.  17. Low protein: discussed choosing high protein and low added sugar foods   I spent a total of  35  minutes on total care today- >50% coordination of care- due to IPOC and d/w nursing about pt's level of responses.    LOS: 3 days A FACE TO FACE EVALUATION WAS PERFORMED  Trent Gabler 02/05/2022, 9:53 AM

## 2022-02-05 NOTE — Plan of Care (Signed)
  Problem: Consults Goal: RH STROKE PATIENT EDUCATION Description: See Patient Education module for education specifics  Outcome: Progressing   Problem: RH BOWEL ELIMINATION Goal: RH STG MANAGE BOWEL WITH ASSISTANCE Description: STG Manage Bowel with  mod I Assistance. Outcome: Progressing Goal: RH STG MANAGE BOWEL W/MEDICATION W/ASSISTANCE Description: STG Manage Bowel with Medication with mod I Assistance. Outcome: Progressing   Problem: RH BLADDER ELIMINATION Goal: RH STG MANAGE BLADDER WITH ASSISTANCE Description: STG Manage Bladder With toileting Assistance Outcome: Progressing   Problem: RH SKIN INTEGRITY Goal: RH STG MAINTAIN SKIN INTEGRITY WITH ASSISTANCE Description: STG Maintain Skin Integrity With min Assistance. Outcome: Progressing   Problem: RH SAFETY Goal: RH STG ADHERE TO SAFETY PRECAUTIONS W/ASSISTANCE/DEVICE Description: STG Adhere to Safety Precautions With cues Assistance/Device. Outcome: Progressing   Problem: RH KNOWLEDGE DEFICIT Goal: RH STG INCREASE KNOWLEDGE OF DIABETES Description: Patient and spouse will be able to manage DM with medications and dietary modifications using handouts and educational materials independently Outcome: Progressing Goal: RH STG INCREASE KNOWLEDGE OF HYPERTENSION Description: Patient and spouse will be able to manage HTN with medications and dietary modifications using handouts and educational materials independently Outcome: Progressing Goal: RH STG INCREASE KNOWLEGDE OF HYPERLIPIDEMIA Description: Patient and spouse will be able to manage HLD with medications and dietary modifications using handouts and educational materials independently Outcome: Progressing Goal: RH STG INCREASE KNOWLEDGE OF STROKE PROPHYLAXIS Outcome: Progressing

## 2022-02-05 NOTE — Progress Notes (Signed)
Physical Therapy Session Note  Patient Details  Name: [name] Gilbert MRN: [id number] Date of Birth: 04-04-1961  Today's Date: 02/05/2022 PT Individual Time: 0800-0911 + 1300-1410 PT Individual Time Calculation (min): 71 min  + 70 min  Short Term Goals: Week 1:  PT Short Term Goal 1 (Week 1): Pt will complete bed mobility with modA PT Short Term Goal 2 (Week 1): Pt will complete bed<>chair transfers with modA and LRAD PT Short Term Goal 3 (Week 1): Pt will propel wheelchair 87f with modA  Skilled Therapeutic Interventions/Progress Updates:     1st session: Pt sleeping in bed to start - awakens to voice and is agreeable to PT tx. Denies pain. Noted to be incontinent of urine with saturated brief. NT entering room and she provided +2 assist for bed level pericare and brief change for time. Also donned pants at bed level with totalA. Pt able to roll in bed with modA and can complete supine to long sitting with supervision with use of bed rails. She requires modA for long sitting to EOB for RLE and trunk support.   Completes squat<>pivot transfer with modA with her RLE on top of therapist's to monitor NWB - pt difficulty complying. Transported to main rehab gym for time management.   Instructed in w/c mobilty, attempting to use BUE for forced use of RUE. Pt with significant motor apraxia, as well as her R sided weakness, limiting her ability to push herself. She would often mistake the arm rests for the w/c rim's.   Placed in // bars to work on sit<>stands and standing tolerance. Using BUE to pull from bars to stand, she initially requires minA for boosting to rise but she is unable to maintain NWB status to RLE. The following trials, PT held her RLE in extension, floating off the ground, to ensure compliance with NWB status - with effort, she's able to pull to stand with light minA with PT supporting RLE. She's able to maintain standing for ~1-2 minutes per stand with PT holding RLE in extension  with BUE support to bars. Several repetitions of this to promote activity tolerance, endurance, and understanding of NWB status.   Then practiced SB transfers from w/c to mat table, she required overall mod/maxA for these due to NWB status and global weakness/deconditioning. She also has some significant delayed initation and processing with motor skills resulting in increased needed assist. Difficulty complying with NWB during these transfers.  Returned to room at end of session. Remained seated in w/c with safety belt alarm on, setup for breakfast, all needs met.   2nd session: Pt sitting in w/c to start session - agreeable to PT and denies pain. Husband at bedside as well. Pt transported in w/c to ortho rehab gym for time management.   Setup on UE ergometer while seated in w/c. Required RUE to be ace wrapped to promote grasp and forced use. Completed 5 minutes forwards + 5 minutes backwards with rest break b/w sets - also required pillow behind her to encourage forward trunk lean to improve posture during task. Played Jazz music, per her request, to improve engagement in therapy session.  Next, practiced car transfers with car height simulating typical sedan height (pt unable to recall the car they own). [place] - she benefited from using bar above head to reach and grab to assist with scooting. She also required assist for managing RLE in/out of car. She requested to attempt squat pivot transfer to exit  the car back to her w/c. She required modA for doing this as well. She continues to have difficulty with NWB compliance on RLE during all transfers.   Pt reporting fatigue and requesting to return to her room to lie down. She refused further mobility training but was agreeable to BITS activities while she sat in her w/c. Completed trail making task as well as the Maze Test. She required mod cues for completing simple trail making #1-#8 and she required max/total cues for  completing Maze Test. She lacked adequate error recognition during both tasks and seemed to struggle with comprehension, simple problem solving, simple sequencing, and anticipatory awareness.   Returned to her room and completed squat<>pivot transfer with modA back to bed, again noncompliant with NWB to RLE despite max cues and assist for offloading. Able to complete sit>supine transition with minA for RLE management only. Pillows provided for supporting extremities. NT in room for routine vitals - WNL. All needs met, bed alarm on.    Therapy Documentation Precautions:  Precautions Precautions: Fall Restrictions Weight Bearing Restrictions: Yes RLE Weight Bearing: Non weight bearing General:    Therapy/Group: Individual Therapy  Alger Simons 02/05/2022, 7:49 AM

## 2022-02-06 DIAGNOSIS — I6381 Other cerebral infarction due to occlusion or stenosis of small artery: Secondary | ICD-10-CM | POA: Diagnosis not present

## 2022-02-06 LAB — GLUCOSE, CAPILLARY
Glucose-Capillary: 211 mg/dL — ABNORMAL HIGH (ref 70–99)
Glucose-Capillary: 191 mg/dL — ABNORMAL HIGH (ref 70–99)
Glucose-Capillary: 75 mg/dL (ref 70–99)
Glucose-Capillary: 306 mg/dL — ABNORMAL HIGH (ref 70–99)

## 2022-02-06 LAB — BASIC METABOLIC PANEL
Anion gap: 7 (ref 5–15)
BUN: 46 mg/dL — ABNORMAL HIGH (ref 8–23)
CO2: 26 mmol/L (ref 22–32)
Calcium: 8.1 mg/dL — ABNORMAL LOW (ref 8.9–10.3)
Chloride: 101 mmol/L (ref 98–111)
Creatinine, Ser: 2.67 mg/dL — ABNORMAL HIGH (ref 0.44–1.00)
GFR, Estimated: 20 mL/min — ABNORMAL LOW (ref >60)
Glucose, Bld: 232 mg/dL — ABNORMAL HIGH (ref 70–99)
Potassium: 4.2 mmol/L (ref 3.5–5.1)
Sodium: 134 mmol/L — ABNORMAL LOW (ref 135–145)

## 2022-02-06 NOTE — Plan of Care (Signed)
  Problem: Consults Goal: RH STROKE PATIENT EDUCATION Description: See Patient Education module for education specifics  02/06/2022 1040 by Mauro Kaufmann L, LPN Outcome: Progressing 02/06/2022 1040 by Mauro Kaufmann L, LPN Outcome: Progressing   Problem: RH BOWEL ELIMINATION Goal: RH STG MANAGE BOWEL WITH ASSISTANCE Description: STG Manage Bowel with  mod I Assistance. 02/06/2022 1040 by Mauro Kaufmann L, LPN Outcome: Not Progressing Note: Patient does not want to get up to [facility]. 02/06/2022 1040 by Kaneesha Constantino L, LPN Outcome: Progressing Goal: RH STG MANAGE BOWEL W/MEDICATION W/ASSISTANCE Description: STG Manage Bowel with Medication with mod I Assistance. 02/06/2022 1040 by Mauro Kaufmann L, LPN Outcome: Progressing 02/06/2022 1040 by Mauro Kaufmann L, LPN Outcome: Progressing   Problem: RH BLADDER ELIMINATION Goal: RH STG MANAGE BLADDER WITH ASSISTANCE Description: STG Manage Bladder With toileting Assistance 02/06/2022 1040 by Mauro Kaufmann L, LPN Outcome: Not Progressing Note: Patient does not want to get up to bedside commode. 02/06/2022 1040 by Mauro Kaufmann L, LPN Outcome: Progressing   Problem: RH SKIN INTEGRITY Goal: RH STG MAINTAIN SKIN INTEGRITY WITH ASSISTANCE Description: STG Maintain Skin Integrity With min Assistance. 02/06/2022 1040 by Mauro Kaufmann L, LPN Outcome: Progressing 02/06/2022 1040 by Mauro Kaufmann L, LPN Outcome: Progressing   Problem: RH SAFETY Goal: RH STG ADHERE TO SAFETY PRECAUTIONS W/ASSISTANCE/DEVICE Description: STG Adhere to Safety Precautions With cues Assistance/Device. 02/06/2022 1040 by Mauro Kaufmann L, LPN Outcome: Progressing 02/06/2022 1040 by Mauro Kaufmann L, LPN Outcome: Progressing   Problem: RH KNOWLEDGE DEFICIT Goal: RH STG INCREASE KNOWLEDGE OF DIABETES Description: Patient and spouse will be able to manage DM with medications and dietary modifications using handouts and educational materials independently 02/06/2022 1040 by Mauro Kaufmann L,  LPN Outcome: Not Progressing 02/06/2022 1040 by Mauro Kaufmann L, LPN Outcome: Progressing Goal: RH STG INCREASE KNOWLEDGE OF HYPERTENSION Description: Patient and spouse will be able to manage HTN with medications and dietary modifications using handouts and educational materials independently 02/06/2022 1040 by Mauro Kaufmann L, LPN Outcome: Not Progressing 02/06/2022 1040 by Mauro Kaufmann L, LPN Outcome: Progressing Goal: RH STG INCREASE KNOWLEGDE OF HYPERLIPIDEMIA Description: Patient and spouse will be able to manage HLD with medications and dietary modifications using handouts and educational materials independently 02/06/2022 1040 by Mauro Kaufmann L, LPN Outcome: Not Progressing 02/06/2022 1040 by Mauro Kaufmann L, LPN Outcome: Progressing Goal: RH STG INCREASE KNOWLEDGE OF STROKE PROPHYLAXIS 02/06/2022 1040 by Mauro Kaufmann L, LPN Outcome: Progressing 02/06/2022 1040 by Mauro Kaufmann L, LPN Outcome: Progressing

## 2022-02-06 NOTE — Progress Notes (Addendum)
Occupational Therapy Session Note  Patient Details  Name: [name] Gilbert MRN: [id number] Date of Birth: [birth year]  Today's Date: 02/06/2022 OT Individual Time: 0845-1000 OT Individual Time Calculation (min): 75 min    Short Term Goals: Week 1:  OT Short Term Goal 1 (Week 1): Pt will complete sit<>stand while following WB precautions with mod assist in prep for self care. OT Short Term Goal 2 (Week 1): Pt will complete toilet transfer with mod assist OT Short Term Goal 3 (Week 1): Pt will donn/doff pants with max assist at sit<>stand level.  Skilled Therapeutic Interventions/Progress Updates:    Subjective: Pt c/o 8/10 pain in right ankle at rest upon OT arrival. Nurse made aware. Pain medication administered mid session. Elevation and repositioning as needed provided throughout session to address pain. Pt nodding head yes to washing at sinkside.    Objective:  Pt semi reclined in bed. Close supervision supine to sit using bed features.  SB transfer with RLE elevated by this therapist to ensure good follow through of NWB precautions needing mod assist.  Pt transported to sink and doffed shirt with min assist and Vcs for training on hemi technique.  Pt bathed UB with supervision.  Donned clean shirt with extra time for problem solving and processing but needing only supervision.  Pt bathed peri region in sitting with supervision and attempted to wash buttocks in standing however required max assist to stand and ensure NWB RLE and unable to maintain standing upright long enough to complete.  Therapist was able to change brief quickly at sit<>stand level with total assist.  Pt brushed teeth with min assist seated with education on hemi techniques and attending to right hand.  Pt frequently dropping items due to right neglect.Transported to ortho gym and completed right hand neuro re-ed with functional reach, grasp and release of cup and bringing to mouth  then min resistive clothes pin pinch and  place on rim of cup with 10 repetitions of each.  Returned to room, Call bell in reach, seat alarm on.      Assessment:  Pt making progress evidenced by increased independence during SB transfer with improved follow through of WB precautions with blocking provided by therapist.  Primary barriers today included moderate ataxia and neglect of RUE primarily wrist and hand.    Plan: Pt would benefit from further training on neuro re-ed of right wrist and hand.    Therapy Documentation Precautions:  Precautions Precautions: Fall Restrictions Weight Bearing Restrictions: Yes RLE Weight Bearing: Non weight bearing   Therapy/Group: Individual Therapy  Amie Critchley 02/06/2022, 12:22 PM

## 2022-02-06 NOTE — Progress Notes (Addendum)
PROGRESS NOTE   Subjective/Complaints: Poor fluid intake  OT working with pt , pt c/o ankle pain , has requested only one hydrocodone in the last 4 d  ROS:  Mild aphasia , can speak at short sentence level  Pt denies SOB, abd pain, CP, N/V/C/D, and vision changes   Objective:   No results found. No results for input(s): WBC, HGB, HCT, PLT in the last 72 hours.  No results for input(s): NA, K, CL, CO2, GLUCOSE, BUN, CREATININE, CALCIUM in the last 72 hours.   Intake/Output Summary (Last 24 hours) at 02/06/2022 0923 Last data filed at 02/06/2022 0757 Gross per 24 hour  Intake 416 ml  Output 0 ml  Net 416 ml         Physical Exam: Vital Signs Blood pressure 121/68, pulse 75, temperature 98.6 F (37 C), temperature source Oral, resp. rate 17, height 5' 2.01" (1.575 m), weight 72.5 kg, SpO2 100 %.  General: No acute distress Mood and affect are appropriate Heart: Regular rate and rhythm no rubs murmurs or extra sounds Lungs: Clear to auscultation, breathing unlabored, no rales or wheezes Abdomen: Positive bowel sounds, soft nontender to palpation, nondistended Extremities: No clubbing, cyanosis, or edema Skin: No evidence of breakdown, no evidence of rash   Musculoskeletal:        General: Swelling present.     Cervical back: Neck supple. No tenderness.     Comments: RUE 3-/5 in biceps, triceps, grip and 1/5 in FA LUE- Biceps 2/5; triceps 3-/5; grip 3-/5 and FA 3-/5 RLE- HF 2-/5; KE 1/5; cannot wiggle toes below cast with ACE wrap LLE- HF 2-/5; KE 2-/5; DF 0/5 and PF 2+/5   However spontaneous movement in R side and L side better than tested when reached for remote to use it  Skin:    Comments: RLE cast from toes to just below knee with half cast; half ACE wrap No skin breakdown seen  Neurological:     Mental Status: She is alert.     Comments: Patient is alert.  No acute distress.  She does display mild to  moderate expressive receptive aphasia. Moderate aphasia noted Decreased to light touch per pt on B/L sides- no difference between both sides  Psychiatric:     Comments: So extremely flat affect and facies    Assessment/Plan: 1. Functional deficits which require 3+ hours per day of interdisciplinary therapy in a comprehensive inpatient rehab setting. Physiatrist is providing close team supervision and 24 hour management of active medical problems listed below. Physiatrist and rehab team continue to assess barriers to discharge/monitor patient progress toward functional and medical goals  Care Tool:  Bathing    Body parts bathed by patient: Right arm, Chest, Abdomen, Left arm, Front perineal area, Right upper leg, Left upper leg, Left lower leg, Face   Body parts bathed by helper: Right lower leg     Bathing assist Assist Level: Moderate Assistance - Patient 50 - 74%     Upper Body Dressing/Undressing Upper body dressing   What is the patient wearing?: Pull over shirt    Upper body assist Assist Level: Minimal Assistance - Patient > 75%  Lower Body Dressing/Undressing Lower body dressing      What is the patient wearing?: Incontinence brief     Lower body assist Assist for lower body dressing: Maximal Assistance - Patient 25 - 49%     Toileting Toileting    Toileting assist Assist for toileting: Dependent - Patient 0%     Transfers Chair/bed transfer  Transfers assist     Chair/bed transfer assist level: Moderate Assistance - Patient 50 - 74%     Locomotion Ambulation   Ambulation assist   Ambulation activity did not occur: Safety/medical concerns          Walk 10 feet activity   Assist  Walk 10 feet activity did not occur: Safety/medical concerns        Walk 50 feet activity   Assist Walk 50 feet with 2 turns activity did not occur: Safety/medical concerns         Walk 150 feet activity   Assist Walk 150 feet activity did not  occur: Safety/medical concerns         Walk 10 feet on uneven surface  activity   Assist Walk 10 feet on uneven surfaces activity did not occur: Safety/medical concerns         Wheelchair     Assist Is the patient using a wheelchair?: Yes Type of Wheelchair: Manual    Wheelchair assist level: Dependent - Patient 0%      Wheelchair 50 feet with 2 turns activity    Assist        Assist Level: Dependent - Patient 0%   Wheelchair 150 feet activity     Assist      Assist Level: Dependent - Patient 0%   Blood pressure 121/68, pulse 75, temperature 98.6 F (37 C), temperature source Oral, resp. rate 17, height 5' 2.01" (1.575 m), weight 72.5 kg, SpO2 100 %.    Medical Problem List and Plan: 1. Functional deficits secondary to large acute to subacute infarct posterior medial left temporal and occipital lobe and left thalamus in the setting of multiple intracranial stenosis as well as history of CVA with left-sided weakness/aphasia             -patient may  shower- bag RLE             -ELOS/Goals: 14-16 days- min A to supervision  Con't CIR- PT, OT and SLP 2.  Antithrombotics: -DVT/anticoagulation:  Pharmaceutical: Lovenox             -antiplatelet therapy: Aspirin 81 mg daily and Plavix 75 mg daily x90 days then aspirin alone 3. Pain Management: Lyrica 75 mg twice daily, hydrocodone as needed  5/27- denies pain after pain meds- con't regimen 4. Mood: Provide emotional support             -antipsychotic agents: N/A 5. Neuropsych: This patient is?capable of making decisions on her own behalf. 6. Skin/Wound Care: Routine skin checks 7. Fluids/Electrolytes/Nutrition: Routine in and outs with follow-up chemistries 8.  Right distal fibular metaphysis fracture with minimal lateral displacement.  Nonsurgical.  Follow-up orthopedic services Dr.Poggi.  Nonweightbearing right lower extremity Pt not using pain meds, no falls, occ puts a little weight on RLE as per  OT but using sling board for transfers , discussed with RN , may need to offer pt tylenol before tharapy  9.  Diabetes mellitus.  Hemoglobin A1c 11.1. Increase Semglee to 19 units daily.  Check blood sugars before meals and at bedtime  5/27- CBGs 72-155-  doing much better- monitor for hypoglycemia  5/28- CBGs 88-183- 1 value 216 before dinner- monitor trend 10.  Seizure prophylaxis.  Keppra 250 mg twice daily. 11.  Permissive hypertension.  Presently on HCTZ 25 mg daily.  Patient on amlodipine 10 mg daily, hydralazine 25 mg 3 times daily, Coreg 25 mg twice daily, Aldactone 150 mg daily PTA.  Vitals:   02/05/22 2000 02/06/22 0335  BP: 139/75 121/68  Pulse: 71 75  Resp: 18 17  Temp: (!) 97.5 F (36.4 C) 98.6 F (37 C)  SpO2: 100% 100%  Well controlled 5/29  12.  Diastolic congestive heart failure.  Monitor for any signs of fluid overload  5/27- will check daily weights- will order 13.  AKI on CKD stage III.  Creatinine baseline 1.4-1.7.  Follow-up chemistries Recheck today given poor intake and diuretic use  14.  Hyperlipidemia.  Crestor 15. Constipation- LBM 3-4 days ago- might need additional intervention- hasn't been eating much per pt/nursing.   5/27- will order Sorbitol after therapy today- no BM in days  5/28- Large BM after sorbitol yesterday afternoon- 16. Obesity: BMI 30.72: provide list of foods to help with weight loss.  17. Low protein: discussed choosing high protein and low added sugar foods    LOS: 4 days A FACE TO FACE EVALUATION WAS PERFORMED  [name] Gilbert 02/06/2022, 9:23 AM

## 2022-02-06 NOTE — Progress Notes (Signed)
Physical Therapy Session Note  Patient Details  Name: [name] Gilbert MRN: [id number] Date of Birth: 1961-01-10  Today's Date: 02/06/2022 PT Individual Time: 2947-6546 PT Individual Time Calculation (min): 62 min  Today's Date: 02/06/2022 PT Missed Time: 13 Minutes Missed Time Reason: Patient fatigue  Short Term Goals: Week 1:  PT Short Term Goal 1 (Week 1): Pt will complete bed mobility with modA PT Short Term Goal 2 (Week 1): Pt will complete bed<>chair transfers with modA and LRAD PT Short Term Goal 3 (Week 1): Pt will propel wheelchair 55ft with modA  Skilled Therapeutic Interventions/Progress Updates:   Received pt sitting in WC, pt agreeable to PT treatment, and reported pain 5/10 in R ankle (premedicated) and fatigue. Session with emphasis on functional mobility/transfers, adherence to RLE NWB precautions, NMR, and generalized strengthening/endurance. Pt transported to/from room in [facility] dependently for energy conservation purposes. Plan to work on sit<>stands in // bars, however bars occupied. In ortho gym, pt transferred WC<>Nustep squat<>pivot to R with mod A (RLE on therapist's foot but poor adherence to NWB) and required mod A to scoot back in Nustep chair. Pt performed BUE and LLE strengthening on Nustep at workload 1 (although resistance felt higher) for 5 minutes for a total of 61 steps with emphasis on cardiovascular endurance and reciprocal movement training. Pt with difficulty gripping with RUE therefore secured RUE to handgrip with ace wrap. Pt also required advance Nustep (pushing>pulling) due to weakness on R side and inability to use RLE - question slow initiation/processing or lack of motivation? Pt transferred Nustep<>WC to L with mod A with RLE on therapist's foot and performed the following seated activities on BITS with emphasis on visual motor skills, visual scanning, reaction time, problem solving, reaching outside BOS, and functional use of BUEs: -Tracing using LUE x 3  trials - improved accuracy with repetition -Single target user paced for 3 minutes (limited by fatigue) min cues for visual scanning - pt sighing and huffing heavily, indicating fatigue. Accuracy 74% and reaction time 8.64 seconds -Trail making part A for 8 minutes and 31 seconds from numbers 1-6 - pt able to identify numbers 1 and 5 but required cues to locate additional numbers and to connect numbers. Pt stated "I'm tired" multiple times during activity. Returned to room and pt requested to return to bed. Pt transferred WC<>bed squat<>pivot to R with mod A and sit<>semi-reclined with mod A. Scooted to [facility] with max A and use of Trendelenburg bed position. Concluded session with pt semi-reclined in bed, needs within reach, and bed alarm on. 13 minutes missed of skilled physical therapy due to fatigue.   Therapy Documentation Precautions:  Precautions Precautions: Fall Restrictions Weight Bearing Restrictions: (P) Yes RLE Weight Bearing: Non weight bearing  Therapy/Group: Individual Therapy Martin Majestic PT, DPT  02/06/2022, 7:15 AM

## 2022-02-06 NOTE — Progress Notes (Signed)
Speech Language Pathology Daily Session Note  Patient Details  Name: [name] Gilbert MRN: [id number] Date of Birth: [birth year]  Today's Date: 02/06/2022 SLP Individual Time: 1445-1530 SLP Individual Time Calculation (min): 45 min  Short Term Goals: Week 1: SLP Short Term Goal 1 (Week 1): Pt will match object/picture to corresponding word with 100% accuracy given Sup A. SLP Short Term Goal 2 (Week 1): Pt will verbalize name of object/picture on 50% of trials given Mod A. SLP Short Term Goal 3 (Week 1): Pt will participate in further cognitive-linguistic and communication assessment to further inform ST POC and d/c planning with 100% completion. SLP Short Term Goal 4 (Week 1): Pt will consume Dysphagia 3 diet textures with thin liquids given minimal overt s/sx concerning for aspiration and Min A for use of safe swallowing strategies to include alternating bites and sips. SLP Short Term Goal 5 (Week 1): Pt will recall daily information x 3 with use of external aids. SLP Short Term Goal 6 (Week 1): Pt will follow 1-step commands on 50% of trials given Mod A.  Skilled Therapeutic Interventions: Skilled ST treatment focused on language and cognitive goals. SLP facilitated session by providing min-to-mod A to orient to time and place given field of choices. Pt benefiting from extended processing time to respond to questions pertaining to biographical information and interests for rapport building. SLP facilitated object naming using stimuli from "naming" app via [employer]. Pt obtained 8% accurate without cues progressing to 60% accurate with max A multimodal cues (semantic, cloze phrase, phonemic, written). Question agnosia d/t several instances of pt reporting she didn't recognize certain commonly used objects (hair brush, toothbrush). Patient was left in bed with alarm activated and immediate needs within reach at end of session. Continue per current plan of care.      Pain Pain Assessment Pain  Scale: 0-10 Pain Score: 0-No pain  Therapy/Group: Individual Therapy  Tamala Ser 02/06/2022, 4:58 PM

## 2022-02-06 NOTE — Progress Notes (Signed)
Received called from daughter states she very concern that her mom is not eating the hospital food and the family has be bringing her food in to eat..Daught was Informed that her mom is on a special diet and the nurse can have the dietitian involved to assist with things her mom may like and plan her mom's meals. Daughter states she will be in and talk with the nurse

## 2022-02-07 DIAGNOSIS — I6381 Other cerebral infarction due to occlusion or stenosis of small artery: Secondary | ICD-10-CM | POA: Diagnosis not present

## 2022-02-07 LAB — GLUCOSE, CAPILLARY
Glucose-Capillary: 209 mg/dL — ABNORMAL HIGH (ref 70–99)
Glucose-Capillary: 182 mg/dL — ABNORMAL HIGH (ref 70–99)
Glucose-Capillary: 134 mg/dL — ABNORMAL HIGH (ref 70–99)
Glucose-Capillary: 182 mg/dL — ABNORMAL HIGH (ref 70–99)

## 2022-02-07 MED ORDER — HYDROCODONE-ACETAMINOPHEN 7.5-325 MG PO TABS: 1.0000 | ORAL_TABLET | Freq: Four times a day (QID) | ORAL | Status: DC | PRN

## 2022-02-07 NOTE — Progress Notes (Signed)
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   Thanks, Lujean Rave, MSN, RNC-OB Diabetes Coordinator [phone] (8a-5p)

## 2022-02-07 NOTE — Progress Notes (Signed)
Speech Language Pathology Daily Session Note  Patient Details  Name: [name] Gilbert MRN: [id number] Date of Birth: 12/31/1960  Today's Date: 02/07/2022 SLP Individual Time: 4270-6237 SLP Individual Time Calculation (min): 49 min  Short Term Goals: Week 1: SLP Short Term Goal 1 (Week 1): Pt will match object/picture to corresponding word with 100% accuracy given Sup A. SLP Short Term Goal 2 (Week 1): Pt will verbalize name of object/picture on 50% of trials given Mod A. SLP Short Term Goal 3 (Week 1): Pt will participate in further cognitive-linguistic and communication assessment to further inform ST POC and d/c planning with 100% completion. SLP Short Term Goal 4 (Week 1): Pt will consume Dysphagia 3 diet textures with thin liquids given minimal overt s/sx concerning for aspiration and Min A for use of safe swallowing strategies to include alternating bites and sips. SLP Short Term Goal 5 (Week 1): Pt will recall daily information x 3 with use of external aids. SLP Short Term Goal 6 (Week 1): Pt will follow 1-step commands on 50% of trials given Mod A.  Skilled Therapeutic Interventions:Skilled ST services focused on swallow and language skills. Pt was able to express her wants/needs and respond to open ended questions at the phrase level with a mild delay. Pt was orientated to city and month only, with ability to name type of place and situation with choice cues. SLP posted signs in room to aid in orientation, pt was able to recall orientation of place, situation and month with spaced retrieval verse reference to signs due to reduced focused attention/vision deficits. Pt was able to return demonstration and recall call bell protocol with max A fade to supervision A verbal cues. SLP facilitated naming of common objects, pt named 3/10 objects increasing to 8/10 objects with mod A sentence completion, phonetic and binary choice cues. Pt demonstrated verbal error awareness with with min-mod A verbal  cues. Pt demonstrated ability to identify objects in a field of 3 in 4/5 opportunities with delayed processing noted. Pt consumed pills whole with water, demonstrating oral clearance, swallow appeared timely and no overt s/s aspiration. Pt requested to rest and missed 10 minutes of treatment time in order to conserve energy for upcoming OT session.  Pt was left in room with call bell within reach and bed alarm set. SLP recommends to continue skilled services.     Pain Pain Assessment Pain Scale: 0-10 Pain Score: 0-No pain  Therapy/Group: Individual Therapy  [name] Gilbert  [facility] 02/07/2022, 8:53 AM

## 2022-02-07 NOTE — Progress Notes (Signed)
PROGRESS NOTE   Subjective/Complaints: No new complaints this morning Working with SLP CBGs 75 to 306: recommended choosing low added sugar/high protein foods  ROS:  Mild aphasia , can speak at short sentence level  Pt denies SOB, abd pain, CP, N/V/C/D, and vision changes   Objective:   No results found. No results for input(s): WBC, HGB, HCT, PLT in the last 72 hours.  Recent Labs    02/06/22 1024  NA 134*  K 4.2  CL 101  CO2 26  GLUCOSE 232*  BUN 46*  CREATININE 2.67*  CALCIUM 8.1*    Intake/Output Summary (Last 24 hours) at 02/07/2022 1040 Last data filed at 02/07/2022 0815 Gross per 24 hour  Intake 460 ml  Output --  Net 460 ml        Physical Exam: Vital Signs Blood pressure 127/62, pulse 73, temperature 98.2 F (36.8 C), resp. rate 18, height 5' 2.01" (1.575 m), weight 77 kg, SpO2 100 %.  General: No acute distress, BMI 31.04 Mood and affect are appropriate Heart: Regular rate and rhythm no rubs murmurs or extra sounds Lungs: Clear to auscultation, breathing unlabored, no rales or wheezes Abdomen: Positive bowel sounds, soft nontender to palpation, nondistended Extremities: No clubbing, cyanosis, or edema Skin: No evidence of breakdown, no evidence of rash   Musculoskeletal:        General: Swelling present.     Cervical back: Neck supple. No tenderness.     Comments: RUE 3-/5 in biceps, triceps, grip and 1/5 in FA LUE- Biceps 2/5; triceps 3-/5; grip 3-/5 and FA 3-/5 RLE- HF 2-/5; KE 1/5; cannot wiggle toes below cast with ACE wrap LLE- HF 2-/5; KE 2-/5; DF 0/5 and PF 2+/5   However spontaneous movement in R side and L side better than tested when reached for remote to use it  Skin:    Comments: RLE cast from toes to just below knee with half cast; half ACE wrap No skin breakdown seen  Neurological:     Mental Status: She is alert.     Comments: Patient is alert.  No acute distress.  She  does display mild to moderate expressive receptive aphasia. Moderate aphasia noted Decreased to light touch per pt on B/L sides- no difference between both sides  Psychiatric:     Comments: So extremely flat affect and facies    Assessment/Plan: 1. Functional deficits which require 3+ hours per day of interdisciplinary therapy in a comprehensive inpatient rehab setting. Physiatrist is providing close team supervision and 24 hour management of active medical problems listed below. Physiatrist and rehab team continue to assess barriers to discharge/monitor patient progress toward functional and medical goals  Care Tool:  Bathing    Body parts bathed by patient: Right arm, Chest, Abdomen, Left arm, Front perineal area, Right upper leg, Left upper leg, Left lower leg, Face   Body parts bathed by helper: Right lower leg     Bathing assist Assist Level: Moderate Assistance - Patient 50 - 74%     Upper Body Dressing/Undressing Upper body dressing   What is the patient wearing?: Pull over shirt    Upper body assist Assist Level: Minimal  Assistance - Patient > 75%    Lower Body Dressing/Undressing Lower body dressing      What is the patient wearing?: Incontinence brief     Lower body assist Assist for lower body dressing: Maximal Assistance - Patient 25 - 49%     Toileting Toileting    Toileting assist Assist for toileting: Dependent - Patient 0%     Transfers Chair/bed transfer  Transfers assist     Chair/bed transfer assist level: Moderate Assistance - Patient 50 - 74%     Locomotion Ambulation   Ambulation assist   Ambulation activity did not occur: Safety/medical concerns          Walk 10 feet activity   Assist  Walk 10 feet activity did not occur: Safety/medical concerns        Walk 50 feet activity   Assist Walk 50 feet with 2 turns activity did not occur: Safety/medical concerns         Walk 150 feet activity   Assist Walk 150  feet activity did not occur: Safety/medical concerns         Walk 10 feet on uneven surface  activity   Assist Walk 10 feet on uneven surfaces activity did not occur: Safety/medical concerns         Wheelchair     Assist Is the patient using a wheelchair?: Yes Type of Wheelchair: Manual    Wheelchair assist level: Dependent - Patient 0%      Wheelchair 50 feet with 2 turns activity    Assist        Assist Level: Dependent - Patient 0%   Wheelchair 150 feet activity     Assist      Assist Level: Dependent - Patient 0%   Blood pressure 127/62, pulse 73, temperature 98.2 F (36.8 C), resp. rate 18, height 5' 2.01" (1.575 m), weight 77 kg, SpO2 100 %.    Medical Problem List and Plan: 1. 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  Impaired mobility and ADLs: goal of bed mobility with Mod A this week. Continue Lovenox.              -antiplatelet therapy: Aspirin 81 mg daily and Plavix 75 mg daily x90 days then aspirin alone 3. Pain: Lyrica 75 mg twice daily, decrease hydrocodone to 1 tab q6H as needed for severe pain 4. Mood: Provide emotional support             -antipsychotic agents: N/A 5. Neuropsych: This patient is?capable of making decisions on her own behalf. 6. Skin/Wound Care: Routine skin checks 7. Fluids/Electrolytes/Nutrition: Routine in and outs with follow-up chemistries 8.  Right distal fibular metaphysis fracture with minimal lateral displacement.  Nonsurgical.  Follow-up orthopedic services Dr.Poggi.  Nonweightbearing right lower extremity Pt not using pain meds, no falls, occ puts a little weight on RLE as per  OT but using sling board for transfers , discussed with RN , may need to offer pt tylenol before tharapy  9.  Diabetes mellitus.  Hemoglobin A1c 11.1. Increase Semglee to 19 units  daily.  Check blood sugars before meals and at bedtime  5/27- CBGs 72-155- doing much better- monitor for hypoglycemia  5/28- CBGs 88-183- 1 value 216 before dinner- monitor trend  5/30: recommended choosing low added sugar/high protein foods.  10.  Seizure prophylaxis.  Keppra 250 mg twice daily. 11.  Permissive hypertension.  Presently on HCTZ 25 mg daily.  Patient on amlodipine 10 mg daily, hydralazine 25 mg 3 times daily, Coreg 25 mg twice daily, Aldactone 150 mg daily PTA.  Vitals:   02/06/22 1926 02/07/22 0414  BP: 127/67 127/62  Pulse: 79 73  Resp: 17 18  Temp: 97.9 F (36.6 C) 98.2 F (36.8 C)  SpO2: 100% 100%  Well controlled 5/30  12.  Diastolic congestive heart failure.  Monitor for any signs of fluid overload  5/27- will check daily weights- will order 13.  AKI on CKD stage III.  Creatinine baseline 1.4-1.7.  Follow-up chemistries Recheck today given poor intake and diuretic use  14.  Hyperlipidemia.  Crestor 15. Constipation- LBM 3-4 days ago- might need additional intervention- hasn't been eating much per pt/nursing.   5/27- will order Sorbitol after therapy today- no BM in days  5/28- Large BM after sorbitol yesterday afternoon- 16. Obesity: BMI 30.72: provide list of foods to help with weight loss.  17. Low protein: discussed choosing high protein and low added sugar foods    LOS: 5 days A FACE TO FACE EVALUATION WAS PERFORMED  [name] Gilbert 02/07/2022, 10:40 AM

## 2022-02-07 NOTE — Progress Notes (Signed)
Occupational Therapy Session Note  Patient Details  Name: [name] Gilbert MRN: [id number] Date of Birth: Aug 09, 1961  Today's Date: 02/07/2022 OT Individual Time: 1000-1100 OT Individual Time Calculation (min): 60 min    Short Term Goals: Week 1:  OT Short Term Goal 1 (Week 1): Pt will complete sit<>stand while following WB precautions with mod assist in prep for self care. OT Short Term Goal 2 (Week 1): Pt will complete toilet transfer with mod assist OT Short Term Goal 3 (Week 1): Pt will donn/doff pants with max assist at sit<>stand level.  Skilled Therapeutic Interventions/Progress Updates:    Subjective: Pt states 6/10 pain in right ankle today; elevated and allowed as needed to address pain.  Requested to "work on my hand".      Objective:  Pt sitting up in w/c, transported to large gym for time mgt.  Neuro re-ed of RUE at tabletop.  Completed putty presses using left hand over right.  Cone taps with multicolor using RUE functional reach however pt unable to correctly identify correct cone with 6 different color options despite max multimodal cues therefore downgraded to one cone tap and pt demonstrated more success.  Pt completed bean bag reach and precision place into medium sized container using RUE. Bean bags placed in various directions to facilitate visual scanning and left attention.  Pt then completed wash cloth gathers using right hand x 20 reps to facilitate attention to right hand and FMC.  Pt transported back to room and requesting back to bed.  SB transfer w/c to EOB with mod assist.  Sit to supine with min assist.  Call bell in reach, seat alarm on.     Assessment:  Improved wrist and hand use with multimodal cues to attend and visually scan during theract.    Plan: Pt would benefit from further training on use of BITS for visual scanning, left attention, and functional use of RUE.  Therapy Documentation Precautions:  Precautions Precautions: Fall Restrictions Weight  Bearing Restrictions: Yes RLE Weight Bearing: Non weight bearing    Therapy/Group: Individual Therapy  Amie Critchley 02/07/2022, 7:30 AM

## 2022-02-07 NOTE — Progress Notes (Signed)
Occupational Therapy Session Note  Patient Details  Name: [name] Gilbert MRN: [id number] Date of Birth: [birth year]  Today's Date: 02/07/2022 OT Individual Time: 0905-1005 OT Individual Time Calculation (min): 60 min    Short Term Goals: Week 1:  OT Short Term Goal 1 (Week 1): Pt will complete sit<>stand while following WB precautions with mod assist in prep for self care. OT Short Term Goal 2 (Week 1): Pt will complete toilet transfer with mod assist OT Short Term Goal 3 (Week 1): Pt will donn/doff pants with max assist at sit<>stand level.  Skilled Therapeutic Interventions/Progress Updates:   Pt seen for skilled OT services this am with focus on ADL and functional mobility re-training with neuro re-ed focus as well as R LE NWB task modifications. Pt resting in bed with HOB elevated and reports "I am ready" upon OT arrival. Pt needs encouragement to initate verbal expression throughout session and is able to communicate with short accurate phrases given increased time for processing. OT set up sink side session for self care routine and provided cues throughout for safety, sequencing and R sided attention and body integration. Pt required min A for rolling and supine to sit with mod A sing L UE on bed rails. OT provided transfer board placement assist but pt able to initiate R sided lateral lean. Moved to L side for transfer board bed to w/c with mod-max A and cues. OT assisted pt w/c level to sink. UB sponge bathing with mod A and LB sponge bathing with max A due to R sided weakness, coordination deficits, R LE NWB and inattention. UB dressing with mod cues for hemi technique and overall min-mod A for pull over scrub top. Max A for incontinence brief and LB pull on scrub pants management with 8 trials of forward weight shifts and partial stands on L LE only for buttocks and peri area clearance. OT provided simple PNF techniques with pt's R UE in diagonals in prep for mouth care to increase R  sided awareness and grasp/manipulation skills for tooth brush, paste and cup management. Required mod a fading to min a after set up and initiation and preperatory techniques. OT completed session with pt wheeled bed side with chair alarm, call bell and needs in place.   Therapy Documentation Precautions:  Precautions Precautions: Fall Restrictions Weight Bearing Restrictions: Yes RLE Weight Bearing: Non weight bearing General:   Vital Signs:   Pain: Pain Assessment Pain Scale: 0-10 Pain Score: 0-No pain ADL: ADL Grooming: Minimal assistance Where Assessed-Grooming: Edge of bed Upper Body Bathing: Minimal assistance Where Assessed-Upper Body Bathing: Edge of bed Lower Body Bathing: Maximal assistance Where Assessed-Lower Body Bathing: Edge of bed, Bed level Upper Body Dressing: Minimal assistance Where Assessed-Upper Body Dressing: Edge of bed Lower Body Dressing: Dependent Where Assessed-Lower Body Dressing: Bed level Toileting: Dependent Where Assessed-Toileting: Bed level Vision   Perception    Praxis   Balance   Exercises:   Other Treatments:     Therapy/Group: Individual Therapy  Vicenta Dunning 02/07/2022, 11:21 AM

## 2022-02-07 NOTE — Progress Notes (Signed)
Physical Therapy Session Note  Patient Details  Name: [name] Gilbert MRN: [id number] Date of Birth: Jul 15, 1961  Today's Date: 02/07/2022 PT Individual Time: 1305-1400 PT Individual Time Calculation (min): 55 min   Short Term Goals: Week 1:  PT Short Term Goal 1 (Week 1): Pt will complete bed mobility with modA PT Short Term Goal 2 (Week 1): Pt will complete bed<>chair transfers with modA and LRAD PT Short Term Goal 3 (Week 1): Pt will propel wheelchair 60ft with modA  Skilled Therapeutic Interventions/Progress Updates:    pt received in bed and agreeable to therapy. No complaint of pain. Pt lethargic and reports feeling tired and cold throughout session, but agreeable with blanket over lap. Supine<>sit with min A for LE management. Pt performed squat pivot transfer EOB<>w/c with min A, pt's RLE on therapist's foot but questionable weight bearing. Pt not receptive to cues, unsure if d/t aphasia/processing delay vs lethargy. Pt transported to [facility] for benefit of sunshine and new environment on mood and energy levels. Pt performed bicep curls with 2 lb bar 3 x 10 with cues for isolated elbow flexion vs shoulder synergy pattern involvement. Provided tapping facilitation at wrist flexors for improved grasp. Pt then performed LAQ 3 x 5 (RLE) and 10 (LLE) for quad strength. Pt with difficult fully extending R knee, improved with repetition. Attempted ball toss/pass but pt not able to coordinate at this time. Pt then performed chest press with dowel x 5, but pt fatigued quickly. Pt returned to room and to bed, was left with all needs in reach and alarm active.   Therapy Documentation Precautions:  Precautions Precautions: Fall Restrictions Weight Bearing Restrictions: Yes RLE Weight Bearing: Non weight bearing General:       Therapy/Group: Individual Therapy  Mickel Fuchs 02/07/2022, 3:59 PM

## 2022-02-08 DIAGNOSIS — I6381 Other cerebral infarction due to occlusion or stenosis of small artery: Secondary | ICD-10-CM | POA: Diagnosis not present

## 2022-02-08 LAB — GLUCOSE, CAPILLARY
Glucose-Capillary: 59 mg/dL — ABNORMAL LOW (ref 70–99)
Glucose-Capillary: 85 mg/dL (ref 70–99)
Glucose-Capillary: 92 mg/dL (ref 70–99)
Glucose-Capillary: 308 mg/dL — ABNORMAL HIGH (ref 70–99)
Glucose-Capillary: 174 mg/dL — ABNORMAL HIGH (ref 70–99)

## 2022-02-08 MED ORDER — B COMPLEX-C PO TABS
1.0000 | ORAL_TABLET | Freq: Every day | ORAL | Status: DC
  Administered 2022-02-08 – 2022-02-18 (×11): 1 via ORAL
  Filled 2022-02-08 (×11): qty 1

## 2022-02-08 MED ORDER — AMANTADINE HCL 100 MG PO CAPS
100.0000 mg | ORAL_CAPSULE | Freq: Every day | ORAL | Status: DC
  Administered 2022-02-08 – 2022-02-15 (×8): 100 mg via ORAL
  Filled 2022-02-08 (×8): qty 1

## 2022-02-08 MED ORDER — INSULIN ASPART 100 UNIT/ML IJ SOLN
0.0000 [IU] | Freq: Three times a day (TID) | INTRAMUSCULAR | Status: DC
  Administered 2022-02-08 – 2022-02-09 (×2): 4 [IU] via SUBCUTANEOUS
  Administered 2022-02-09: 2 [IU] via SUBCUTANEOUS
  Administered 2022-02-10: 3 [IU] via SUBCUTANEOUS
  Administered 2022-02-10 – 2022-02-11 (×2): 1 [IU] via SUBCUTANEOUS

## 2022-02-08 NOTE — Progress Notes (Addendum)
Patient ID: [name] Gilbert, female   DOB: Dec 26, 1960, 62 y.o.   MRN: [id number]  Met with pt to give her a team conference update goals of min assist wheelchair level due to her NWB on her right leg. Target discharge date on 6/10. Left message for husband at home and will call daughter. Will need family education prior to discharge home and hopefully husband is working on a ramp for three steps at home.   2:13 PM Met with husband when here to give team conference update and enquire about the ramp. He needs resources for this and have given him them. He is still not feeling well and is light headed-encouraged him to call his MD and get an appointment due to was in the hospital last week with his own cardiac issues. Have scheduled family education for Thursday at 1;00 with husband. Also spoke with daughter who feels if can be placed on a regular diet Mom will be doing better. Tod her will reach out and message Speech to see if appropriate to do this. Pt is an extremely picky eater. Daughter reports when she brings food in pt seems to swallowing just fine and pt agrees with her. Encouraged husband to go to the MD for himself and be checked out and to look into the ramp resources.

## 2022-02-08 NOTE — Progress Notes (Signed)
Physical Therapy Session Note  Patient Details  Name: [name] Gilbert MRN: [id number] Date of Birth: Mar 20, 1961  Today's Date: 02/08/2022 PT Individual Time: 1740-8144 PT Individual Time Calculation (min): 58 min   Short Term Goals: Week 1:  PT Short Term Goal 1 (Week 1): Pt will complete bed mobility with modA PT Short Term Goal 2 (Week 1): Pt will complete bed<>chair transfers with modA and LRAD PT Short Term Goal 3 (Week 1): Pt will propel wheelchair 38ft with modA  Skilled Therapeutic Interventions/Progress Updates:    Pt seated in w/c on arrival and agreeable to therapy. Pt reports 5-6/10 RLE pain, but denies pain medication stating, "I just want to get the exercise over with." Rest and positioning as needed. Pt with lethargy and poor initiation throughout session. Pt transported to therapy gym for time management and energy conservation. Pt performed kinetron 4 x 20 cycles with LLE only. Pt unable to overcome weight of therapist's foot on other plate at 80 cm/sec.   Transitioned to mat table. Pt performed squat pivot <>w/c with min for VC for technique and guiding hips to L side. Mod A to R side d/t UE and WB precautions. Sitting balance with supervision throughout. Attempted chair push activity, but pt with insufficient grasp and coordination at this time. Pt performed lateral lean with elbow push up from elbow on LUE and progressing to elbow by last rep on RUE, 3 x 4 BIL. Assist to place/stabilie hand BIL, R>L. Tactile cueing and VC for sequencing and NMR. Pt able to place R elbow on table with max multimodal cueing through 2-3 sets for initiation and postural support. Did well with NDT style cueing at ribs. LUE progressed to no physical assist by last rep. Pt with greatly improved weight shifting by last set.  Returned to room and pt performed mod A squat pivot to L side with cueing for technique and sequencing, assist for pivot and hip elevation. Sit>supine with supervision. Pt remained  in bed and was left with all needs in reach and alarm active.    Therapy Documentation Precautions:  Precautions Precautions: Fall Restrictions Weight Bearing Restrictions: Yes RLE Weight Bearing: Non weight bearing General:      Therapy/Group: Individual Therapy  [name] 02/08/2022, 9:26 AM

## 2022-02-08 NOTE — Progress Notes (Signed)
Hypoglycemic Event  CBG: 69  Treatment: 4 oz juice/soda  Symptoms: None  Follow-up CBG: [id number] CBG Result:85  Possible Reasons for Event: Unknown  Comments/MD notified:    Precious Haws

## 2022-02-08 NOTE — Patient Care Conference (Signed)
Inpatient RehabilitationTeam Conference and Plan of Care Update Date: 02/08/2022   Time: 11:10 AM    Patient Name: [name] Gilbert      Medical Record Number: [id number]  Date of Birth: Mar 19, 1961 Sex: Female         Room/Bed: 4M10C/4M10C-01 Payor Info: Payor: [employer] MEDICARE / Plan: [employer] HMO / Product Type: *No Product type* /    Admit Date/Time:  02/02/2022  1:50 PM  Primary Diagnosis:  Left thalamic infarction [facility])  Hospital Problems: Principal Problem:   Left thalamic infarction [facility])    Expected Discharge Date: Expected Discharge Date: 02/18/22  Team Members Present: Physician leading conference: Dr. Leeroy Cha Social Worker Present: Ovidio Kin, LCSW Nurse Present: Dorien Chihuahua, RN PT Present: Page Spiro, PT OT Present: Leretha Pol, OT SLP Present: Charolett Bumpers, SLP PPS Coordinator present : Gunnar Fusi, SLP     Current Status/Progress Goal Weekly Team Focus  Bowel/Bladder   incontient to bowel and bladder  to regain continence      Swallow/Nutrition/ Hydration   Dys 3 textures and thin liquids,  Supervision A  tolerances, swallow strategies and regular texture trials   ADL's   mod A sliding board transfers, minA UB self care, max-total A LB bathing/toileting/dressing  min A LB self care and transfers; sup UB self care  neuro re-ed, visual scanning right and left attention, pt edu/training, hemi techniques, self care training, dc planning, transfer training, safety awareness edu   Mobility   min/mod assist bed mobility, mod assist slide board bed<>wheelchair transfers to maintain WBing precautions, mod assist sit<>stand in // bars with poor maintenance of WBing restrictions - unable to safely progress to ambulation and do not anticipate that will be safe by D/C - continues to have impaired motor planning with inattention  min assist overall at wheelchair level  activity tolerance, pt education, bed mobility training, transfer training,  wheelchair part management and propulsion, LE strengthening   Communication   express at phrase level, mod-max A  Supervision-Min A  naming objects, following commands and correcting errors   Safety/Cognition/ Behavioral Observations  Mod A  Supervision-min A  orientation and awareness intellectual and emergent   Pain   denies pain  denies pain      Skin   skin intact, soft cast to right lower extermity  skin to remain intact        Discharge Planning:  HOme with husband who is able to assist pt and will nee training prior to DC home. Aware will need 24/7 physical care at DC. Husband to take measurements of doorways of home to see if [facility] accessible   Team Discussion: Patient with fatigue and pain in right ankle. Remains incontinent of bladder with ataxia, poor visual scanning, left inattention right neglect, poor initiation, flat affect s/p left thalamic infarct.  Patient on target to meet rehab goals: Currently has difficulty following multi step commands, slow to initiate responses, poor error awareness. Needs min assist for upper body care and max assist - total assist for lower body care. She has trouble maintaining NWB on right leg. Needs mod assist for slide board transfers. Goals for discharge set for min assist overall and plan w/c level with mod assist.   *See Care Plan and progress notes for long and short-term goals.   Revisions to Treatment Plan:  Changed to D3 thin diet Regular texture trials   Teaching Needs: Safety, medications, dietary modifications, transfers, toileting, etc  Current Barriers to Discharge: Decreased caregiver support, Home  enviroment access/layout, Incontinence, and Weight bearing restrictions  Possible Resolutions to Barriers: Family education Ramp for entry to home DME pending     Medical Summary Current Status: poor appetite, unhealthy diet, type 2 diabetes, obesity, urinary incontinence with lack of bladder sensation, hypoglycemia, right  ankle pain, ataxia  Barriers to Discharge: Behavior;Neurogenic Bowel & Bladder;Weight;Medical stability  Barriers to Discharge Comments: poor appetite, unhealthy diet, type 2 diabetes, obesity, urinary incontinence with lack of bladder sensation, hypoglycemia, right ankle pain, right inattention, left thalamic infarction Possible Resolutions to [employer] Focus: provide dietary education, continue bowel adn bladder program, decrease novolog, add vitamin C, continue aspirin 81 mg daily, continue plavix   Continued Need for Acute Rehabilitation Level of Care: The patient requires daily medical management by a physician with specialized training in physical medicine and rehabilitation for the following reasons: Direction of a multidisciplinary physical rehabilitation program to maximize functional independence : Yes Medical management of patient stability for increased activity during participation in an intensive rehabilitation regime.: Yes Analysis of laboratory values and/or radiology reports with any subsequent need for medication adjustment and/or medical intervention. : Yes   I attest that I was present, lead the team conference, and concur with the assessment and plan of the team.   Dorien Chihuahua B 02/08/2022, 4:07 PM

## 2022-02-08 NOTE — Progress Notes (Signed)
Speech Language Pathology Daily Session Note  Patient Details  Name: [name] Gilbert MRN: [id number] Date of Birth: [birth year]  Today's Date: 02/08/2022 SLP Individual Time: 1201-1230 SLP Individual Time Calculation (min): 29 min  Short Term Goals: Week 1: SLP Short Term Goal 1 (Week 1): Pt will match object/picture to corresponding word with 100% accuracy given Sup A. SLP Short Term Goal 2 (Week 1): Pt will verbalize name of object/picture on 50% of trials given Mod A. SLP Short Term Goal 3 (Week 1): Pt will participate in further cognitive-linguistic and communication assessment to further inform ST POC and d/c planning with 100% completion. SLP Short Term Goal 4 (Week 1): Pt will consume Dysphagia 3 diet textures with thin liquids given minimal overt s/sx concerning for aspiration and Min A for use of safe swallowing strategies to include alternating bites and sips. SLP Short Term Goal 5 (Week 1): Pt will recall daily information x 3 with use of external aids. SLP Short Term Goal 6 (Week 1): Pt will follow 1-step commands on 50% of trials given Mod A.  Skilled Therapeutic Interventions:Skilled ST services focused on language skills. Pt continues with disorientation time and SLP questions reading verse visual deficits when visual aids were brought to midline/pt self-adjusted distance. Pt demonstrated difficulty reading at phrase level pertaining to aid of situation but could locate month and year on calendar independently. SLP will continue to investigate reading and visual ability. Pt's lunch tray arrived and pt refused to consume due to preference. SLP and pt discussed preferred food items, pt was able to express vague desires at the phrase level with min A semantic cues for word finding and select concrete preferences given 2-3 choices with extra time. SLP placed lunch, dinner and breakfast order via phone. Pt was left in room with call bell within reach and bed alarm set. SLP recommends to  continue skilled services.     Pain Pain Assessment Pain Score: 0-No pain  Therapy/Group: Individual Therapy  Keimari   [facility] 02/08/2022, 1:45 PM

## 2022-02-08 NOTE — Progress Notes (Signed)
Occupational Therapy Session Note  Patient Details  Name: [name] Gilbert MRN: [id number] Date of Birth: August 27, 1961  Today's Date: 02/08/2022 OT Individual Time: 0815-0900 OT Individual Time Calculation (min): 45 min    Short Term Goals: Week 1:  OT Short Term Goal 1 (Week 1): Pt will complete sit<>stand while following WB precautions with mod assist in prep for self care. OT Short Term Goal 2 (Week 1): Pt will complete toilet transfer with mod assist OT Short Term Goal 3 (Week 1): Pt will donn/doff pants with max assist at sit<>stand level.  Skilled Therapeutic Interventions/Progress Updates:    Pt semi reclined in bed, reports 6/10 pain right ankle and states "Im not sure" regarding incontinence.  Noted pt soiled of urine therefore initiated bed level total assist toileting.  Pt able to roll left and right using bed features with min-mod Verbal and visual cues during pericare and clothing mgt.  Pt completed supine to sit with CGA and increased time.  SB transfer with min assist to w/c.  Pt transported to sink where she washed her face and brushed teeth and combed hair with setup-supervision.  Pt sitting up in w/c, RLE elevated using leg rest, call bell in reach, seat alarm on at end of session.  Improved independence during SB transfer toward non-affected side today.   Therapy Documentation Precautions:  Precautions Precautions: Fall Restrictions Weight Bearing Restrictions: Yes RLE Weight Bearing: Non weight bearing    Therapy/Group: Individual Therapy  Amie Critchley 02/08/2022, 8:05 AM

## 2022-02-08 NOTE — Progress Notes (Signed)
PROGRESS NOTE   Subjective/Complaints: No new complaints this morning Continues to have right ankle pain. Discussed starting vitamin C to help with pain and [name] Gilbert is agreeable Denies constipation  ROS:  Mild aphasia , can speak at short sentence level  Pt denies SOB, abd pain, CP, N/V/C/D, and vision changes, +right ankle pain   Objective:   No results found. No results for input(s): WBC, HGB, HCT, PLT in the last 72 hours.  Recent Labs    02/06/22 1024  NA 134*  K 4.2  CL 101  CO2 26  GLUCOSE 232*  BUN 46*  CREATININE 2.67*  CALCIUM 8.1*    Intake/Output Summary (Last 24 hours) at 02/08/2022 1019 Last data filed at 02/07/2022 1837 Gross per 24 hour  Intake 480 ml  Output --  Net 480 ml        Physical Exam: Vital Signs Blood pressure 133/76, pulse 67, temperature 98 F (36.7 C), resp. rate 18, height 5' 2.01" (1.575 m), weight 78 kg, SpO2 100 %.  General: No acute distress, BMI 31.44 Mood and affect are appropriate Heart: Regular rate and rhythm no rubs murmurs or extra sounds Lungs: Clear to auscultation, breathing unlabored, no rales or wheezes Abdomen: Positive bowel sounds, soft nontender to palpation, nondistended Extremities: No clubbing, cyanosis, or edema Skin: No evidence of breakdown, no evidence of rash   Musculoskeletal:        General: Swelling present.     Cervical back: Neck supple. No tenderness.     Comments: RUE 3-/5 in biceps, triceps, grip and 1/5 in FA LUE- Biceps 2/5; triceps 3-/5; grip 3-/5 and FA 3-/5 RLE- HF 2-/5; KE 1/5; cannot wiggle toes below cast with ACE wrap LLE- HF 2-/5; KE 2-/5; DF 0/5 and PF 2+/5 Brushing her hair    However spontaneous movement in R side and L side better than tested when reached for remote to use it  Skin:    Comments: RLE cast from toes to just below knee with half cast; half ACE wrap No skin breakdown seen  Neurological:     Mental Status:  [name] Gilbert is alert.     Comments: Patient is alert.  No acute distress.  [name] Gilbert does display mild to moderate expressive receptive aphasia. Moderate aphasia noted Decreased to light touch per pt on B/L sides- no difference between both sides  Poor initiation Psychiatric:     Comments: So extremely flat affect and facies    Assessment/Plan: 1. Functional deficits which require 3+ hours per day of interdisciplinary therapy in a comprehensive inpatient rehab setting. Physiatrist is providing close team supervision and 24 hour management of active medical problems listed below. Physiatrist and rehab team continue to assess barriers to discharge/monitor patient progress toward functional and medical goals  Care Tool:  Bathing    Body parts bathed by patient: Right arm, Chest, Abdomen, Left arm, Front perineal area, Right upper leg, Left upper leg, Left lower leg, Face   Body parts bathed by helper: Right lower leg, Buttocks     Bathing assist Assist Level: Maximal Assistance - Patient 24 - 49%     Upper Body Dressing/Undressing Upper body dressing   What  is the patient wearing?: Pull over shirt    Upper body assist Assist Level: Moderate Assistance - Patient 50 - 74%    Lower Body Dressing/Undressing Lower body dressing      What is the patient wearing?: Pants, Incontinence brief     Lower body assist Assist for lower body dressing: Maximal Assistance - Patient 25 - 49%     Toileting Toileting    Toileting assist Assist for toileting: Dependent - Patient 0%     Transfers Chair/bed transfer  Transfers assist     Chair/bed transfer assist level: Moderate Assistance - Patient 50 - 74%     Locomotion Ambulation   Ambulation assist   Ambulation activity did not occur: Safety/medical concerns          Walk 10 feet activity   Assist  Walk 10 feet activity did not occur: Safety/medical concerns        Walk 50 feet activity   Assist Walk 50 feet with 2 turns  activity did not occur: Safety/medical concerns         Walk 150 feet activity   Assist Walk 150 feet activity did not occur: Safety/medical concerns         Walk 10 feet on uneven surface  activity   Assist Walk 10 feet on uneven surfaces activity did not occur: Safety/medical concerns         Wheelchair     Assist Is the patient using a wheelchair?: Yes Type of Wheelchair: Manual    Wheelchair assist level: Dependent - Patient 0%      Wheelchair 50 feet with 2 turns activity    Assist        Assist Level: Dependent - Patient 0%   Wheelchair 150 feet activity     Assist      Assist Level: Dependent - Patient 0%   Blood pressure 133/76, pulse 67, temperature 98 F (36.7 C), resp. rate 18, height 5' 2.01" (1.575 m), weight 78 kg, SpO2 100 %.    Medical Problem List and Plan: 1. 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  Impaired mobility and ADLs: goal of bed mobility with Mod A this week. Continue Lovenox.              -antiplatelet therapy: Aspirin 81 mg daily and Plavix 75 mg daily x90 days then aspirin alone 3. Pain: Lyrica 75 mg twice daily, decrease hydrocodone to 1 tab q6H as needed for severe pain 4. Mood: Provide emotional support             -antipsychotic agents: N/A 5. Neuropsych: This patient is?capable of making decisions on her own behalf. 6. Skin/Wound Care: Routine skin checks 7. Fluids/Electrolytes/Nutrition: Routine in and outs with follow-up chemistries 8.  Right distal fibular metaphysis fracture with minimal lateral displacement.  Nonsurgical.  Follow-up orthopedic services Dr.Poggi.   Nonweightbearing right lower extremity Pt not using pain meds, no falls, occ puts a little weight on RLE as per OT but using sling board for transfers , discussed with RN , may need to offer  pt tylenol before tharapy  9.  Diabetes mellitus.  Hemoglobin A1c 11.1. Increase Semglee to 19 units daily.  Check blood sugars before meals and at bedtime. Recommended choosing low added sugar/high protein foods. Decrease Novolog sliding scale for hypoglycemia 10.  Seizure prophylaxis.  Keppra 250 mg twice daily. 11.  Permissive hypertension.  Presently on HCTZ 25 mg daily.  Patient on amlodipine 10 mg daily, hydralazine 25 mg 3 times daily, Coreg 25 mg twice daily, Aldactone 150 mg daily PTA.  Vitals:   02/07/22 2010 02/08/22 0251  BP: (!) 131/56 133/76  Pulse: 69 67  Resp: 18 18  Temp: 98.4 F (36.9 C) 98 F (36.7 C)  SpO2: 100% 100%  Well controlled 5/30  12.  Diastolic congestive heart failure.  Monitor for any signs of fluid overload  5/27- will check daily weights- will order 13.  AKI on CKD stage III.  Creatinine baseline 1.4-1.7.  Follow-up chemistries Recheck today given poor intake and diuretic use  14.  Hyperlipidemia.  Crestor 15. Constipation- LBM 3-4 days ago- might need additional intervention- hasn't been eating much per pt/nursing.   5/27- will order Sorbitol after therapy today- no BM in days  5/28- Large BM after sorbitol yesterday afternoon- 16. Obesity: BMI 30.72: provide list of foods to help with weight loss.  17. Low protein: discussed choosing high protein and low added sugar foods 18. Poor initiation: start amantadine 100mg  daily 19. Dysphagia: continue D3 trials    LOS: 6 days A FACE TO FACE EVALUATION WAS PERFORMED  Clide Deutscher [name] Gilbert 02/08/2022, 10:19 AM

## 2022-02-08 NOTE — Progress Notes (Signed)
Occupational Therapy Session Note  Patient Details  Name: [name] Gilbert MRN: [id number] Date of Birth: Feb 04, 1961  Today's Date: 02/08/2022 OT Individual Time: 7062-3762 OT Individual Time Calculation (min): 70 min    Short Term Goals: Week 1:  OT Short Term Goal 1 (Week 1): Pt will complete sit<>stand while following WB precautions with mod assist in prep for self care. OT Short Term Goal 2 (Week 1): Pt will complete toilet transfer with mod assist OT Short Term Goal 3 (Week 1): Pt will donn/doff pants with max assist at sit<>stand level.  Skilled Therapeutic Interventions/Progress Updates:    Pt resting in bed upon arrival. OT intervention with focus on orientation, attention to L, BUE AROM/strengthening, attention to task, and arousal to increase independence with BADLs. Pt required max verbal cues for orientation to place, situation, year, and month. Pt unable to recall morning therapies or what tasks she performed.Pt required max verbal cues to attend to her Lt when performing BUE strengthing tasks with beach ball and 2# bar. Pt required max verbal cues for attention to task and arousal during rest breaks. Pt remained in bed with all needs within reach and bed alarm activated.    Therapy Documentation Precautions:  Precautions Precautions: Fall Restrictions Weight Bearing Restrictions: Yes RLE Weight Bearing: Non weight bearing   Pain: Pt denies pain this morning    Therapy/Group: Individual Therapy  Rich Brave 02/08/2022, 12:07 PM

## 2022-02-09 DIAGNOSIS — E1122 Type 2 diabetes mellitus with diabetic chronic kidney disease: Secondary | ICD-10-CM

## 2022-02-09 DIAGNOSIS — Z298 Encounter for other specified prophylactic measures: Secondary | ICD-10-CM

## 2022-02-09 DIAGNOSIS — N183 Chronic kidney disease, stage 3 unspecified: Secondary | ICD-10-CM

## 2022-02-09 LAB — CREATININE, SERUM
Creatinine, Ser: 2.43 mg/dL — ABNORMAL HIGH (ref 0.44–1.00)
GFR, Estimated: 22 mL/min — ABNORMAL LOW (ref >60)

## 2022-02-09 LAB — GLUCOSE, CAPILLARY
Glucose-Capillary: 230 mg/dL — ABNORMAL HIGH (ref 70–99)
Glucose-Capillary: 98 mg/dL (ref 70–99)
Glucose-Capillary: 341 mg/dL — ABNORMAL HIGH (ref 70–99)
Glucose-Capillary: 397 mg/dL — ABNORMAL HIGH (ref 70–99)
Glucose-Capillary: 317 mg/dL — ABNORMAL HIGH (ref 70–99)

## 2022-02-09 MED ORDER — HYDROCODONE-ACETAMINOPHEN 7.5-325 MG PO TABS: 1.0000 | ORAL_TABLET | Freq: Three times a day (TID) | ORAL | Status: DC | PRN

## 2022-02-09 MED ORDER — INSULIN GLARGINE-YFGN 100 UNIT/ML ~~LOC~~ SOLN
20.0000 [IU] | Freq: Every day | SUBCUTANEOUS | Status: DC
Start: 2022-02-09 — End: 2022-02-11
  Administered 2022-02-09 – 2022-02-11 (×3): 20 [IU] via SUBCUTANEOUS
  Filled 2022-02-09 (×3): qty 0.2

## 2022-02-09 NOTE — Progress Notes (Signed)
Physical Therapy Session Note  Patient Details  Name: [name] Gilbert MRN: [id number] Date of Birth: 09-11-1961  Today's Date: 02/09/2022 PT Individual Time: 0905-1015 PT Individual Time Calculation (min): 70 min   Short Term Goals: Week 1:  PT Short Term Goal 1 (Week 1): Pt will complete bed mobility with modA PT Short Term Goal 2 (Week 1): Pt will complete bed<>chair transfers with modA and LRAD PT Short Term Goal 3 (Week 1): Pt will propel wheelchair 61ft with modA Week 2:    Week 3:     Skilled Therapeutic Interventions/Progress Updates:    Pt initially denies pain Pt rolls L/R and dons shorts w/max assist and max cueing for sequencing and attention to RUE use/donning shorts completely.  Additional time for task. Supine to sit w/min assist and use of rails, cues. In sitting, dons shirt w/max cues to attend to RUE, mod assist, min to cga for sitting balance during task. sliding board transfer bed to wc w/set up, cues for sequencing, min assist, slight downhill bias.  Repositions in wc w/max cues, cga.  In parallel bars w/2in step under L foot performed Sit to stand using bars to pullup and therapist elevating RLE to prevent wbing pt able to come to standing w/min assist. Pt initially leaning mildly to L but able to wt shift to midline w/cues, stood x 1.5 min, able to work on lifting RLE from hip.  Repeated x 3, unable to lift RLE w/2/3 standing sets but leg maintained in NWBing by therapist wlmanual elevation.  In sitting RUE NMRE task - pt worked on Loss adjuster, chartered using yellow/red clips and lower 2 rungs of box.  Completes total of 9 clips using RUE w/additional time, dyscoordination but able to complete with occasional min assist/self corrects majority of errors, occasionallly assists w/positioning of clip in R using L hand..  Pt transported to room. sliding board transfer wc to bed w/total assist for set up, therapist elevates LLE to maintain NWBing, cues for sequencing and min  assist w/slight uphill bias.  Once sitting pt attempts to lie straight back horizontally in bed/max cues to redirect to sitting and attempted scooting to R toward [facility] but pt unable to coordinate movement directly to R side.  Sit to side to supine w/max cues.  Scoots in supine using bedrails and max cues for RUE engagement., min assist.   Pt left supine w/rails up x 3, alarm set, bed in lowest position, and needs in reach.    Therapy Documentation Precautions:  Precautions Precautions: Fall Restrictions Weight Bearing Restrictions: Yes RLE Weight Bearing: Non weight bearing   Therapy/Group: Individual Therapy Rada Hay, PT   Shearon Balo 02/09/2022, 9:52 AM

## 2022-02-09 NOTE — Progress Notes (Signed)
Occupational Therapy Session Note  Patient Details  Name: [name] Gilbert MRN: [id number] Date of Birth: 1961/01/16  Today's Date: 02/09/2022 OT Individual Time: 1100-1200 OT Individual Time Calculation (min): 60 min    Short Term Goals: Week 1:  OT Short Term Goal 1 (Week 1): Pt will complete sit<>stand while following WB precautions with mod assist in prep for self care. OT Short Term Goal 2 (Week 1): Pt will complete toilet transfer with mod assist OT Short Term Goal 3 (Week 1): Pt will donn/doff pants with max assist at sit<>stand level.   Skilled Therapeutic Interventions/Progress Updates:   Pt states no pain in ankle this session.  Agreeable to going outside for therapy to improve overall mood.  Pt reports feeling good after going outside and that she really enjoyed it.  Pt semi reclined in bed. Supine to sit with supervision.  Squat pivot with mod assist with difficulty motor planning transfer.  Pt transported to atrium porch and participated in RUE neuro re-ed and table top theract to promote visual scanning, left attention. 2x 10 ball roll and push RUE.  3 color poker chip grasp and match/stack using RUE and placed in various directions to increased demand of visual scanning and left attention.  Pt able to accurately match colors without cues.  Pt needing min intermittent verbal cues to use right hand instead of left and also to facilitate visual tracking of each poker chip.  Pt dropped 3/24 chips.  Increased time needed with ataxic movements present RUE.  Returned to room,  Call bell in reach, seat alarm on.     Therapy Documentation Precautions:  Precautions Precautions: Fall Restrictions Weight Bearing Restrictions: Yes RLE Weight Bearing: Non weight bearing   Therapy/Group: Individual Therapy  Ezekiel Slocumb 02/09/2022, 1:05 PM

## 2022-02-09 NOTE — Progress Notes (Signed)
Speech Language Pathology Daily Session Note  Patient Details  Name: Avion Patella MRN: [id number] Date of Birth: 07/31/1961  Today's Date: 02/09/2022 SLP Individual Time: 0802-0900 SLP Individual Time Calculation (min): 58 min  Short Term Goals: Week 1: SLP Short Term Goal 1 (Week 1): Pt will match object/picture to corresponding word with 100% accuracy given Sup A. SLP Short Term Goal 2 (Week 1): Pt will verbalize name of object/picture on 50% of trials given Mod A. SLP Short Term Goal 3 (Week 1): Pt will participate in further cognitive-linguistic and communication assessment to further inform ST POC and d/c planning with 100% completion. SLP Short Term Goal 4 (Week 1): Pt will consume Dysphagia 3 diet textures with thin liquids given minimal overt s/sx concerning for aspiration and Min A for use of safe swallowing strategies to include alternating bites and sips. SLP Short Term Goal 5 (Week 1): Pt will recall daily information x 3 with use of external aids. SLP Short Term Goal 6 (Week 1): Pt will follow 1-step commands on 50% of trials given Mod A.  Skilled Therapeutic Interventions:Skilled ST services focused on swallow and language skills. SLP facilitated PO consumption of regular texture snack, with slight prolonged mastication, but swallow function appeared largely [facility]. SLP and Pt discussed desired regular texture lunch trial tray items for tomorrow's ST session piror to texture advancement. SLP facilitated word finding skills in picture description task, pt demonstrated 90% accuracy with mod A verbal cues to expand expressive language and min A semantic cues for word finding. Pt was unable to match 3 letter word to objects in a field of 2 in 0/3 trials. Pt demonstrated no reading comprehension at the word level and inability to decode the words presented. Therefore written aids for orientation are not helpful at this time. Pt was orientated to place only. Pt was left in room with call bell  within reach and bed alarm set. SLP recommends to continue skilled services.     Pain Pain Assessment Pain Score: 0-No pain  Therapy/Group: Individual Therapy  Piper Albro  [facility] 02/09/2022, 2:06 PM

## 2022-02-09 NOTE — Progress Notes (Signed)
PROGRESS NOTE   Subjective/Complaints: No new complaints this morning.  Right ankle pain is on and off. Provided with a pain relief journal.  Discussed increase in Semglee for elevated CBGs  ROS:  Mild aphasia , can speak at short sentence level  Pt denies SOB, abd pain, CP, N/V/C/D, and vision changes, +right ankle pain   Objective:   No results found. No results for input(s): WBC, HGB, HCT, PLT in the last 72 hours.  Recent Labs    02/06/22 1024 02/09/22 0516  NA 134*  --   K 4.2  --   CL 101  --   CO2 26  --   GLUCOSE 232*  --   BUN 46*  --   CREATININE 2.67* 2.43*  CALCIUM 8.1*  --     Intake/Output Summary (Last 24 hours) at 02/09/2022 0956 Last data filed at 02/08/2022 1728 Gross per 24 hour  Intake 0 ml  Output --  Net 0 ml        Physical Exam: Vital Signs Blood pressure 124/64, pulse 67, temperature 97.9 F (36.6 C), temperature source Oral, resp. rate 18, height 5' 2.01" (1.575 m), weight 75.6 kg, SpO2 100 %.  General: No acute distress, BMI 31.44 Mood and affect are appropriate Heart: Regular rate and rhythm no rubs murmurs or extra sounds Lungs: Clear to auscultation, breathing unlabored, no rales or wheezes Abdomen: Positive bowel sounds, soft nontender to palpation, nondistended Extremities: No clubbing, cyanosis, or edema Skin: No evidence of breakdown, no evidence of rash   Musculoskeletal:        General: Swelling present.     Cervical back: Neck supple. No tenderness.     Comments: RUE 3-/5 in biceps, triceps, grip and 1/5 in FA LUE- Biceps 2/5; triceps 3-/5; grip 3-/5 and FA 3-/5 RLE- HF 2-/5; KE 1/5; cannot wiggle toes below cast with ACE wrap LLE- HF 2-/5; KE 2-/5; DF 0/5 and PF 2+/5 Brushing her hair    However spontaneous movement in R side and L side better than tested when reached for remote to use it  Skin:    Comments: RLE cast from toes to just below knee with half  cast; half ACE wrap No skin breakdown seen  Neurological:     Comments: Patient is alert.  Disoriented to time. No acute distress.  She does display mild to moderate expressive receptive aphasia. Moderate aphasia noted Decreased to light touch per pt on B/L sides- no difference between both sides  Poor initiation Psychiatric:     Comments: So extremely flat affect and facies    Assessment/Plan: 1. Functional deficits which require 3+ hours per day of interdisciplinary therapy in a comprehensive inpatient rehab setting. Physiatrist is providing close team supervision and 24 hour management of active medical problems listed below. Physiatrist and rehab team continue to assess barriers to discharge/monitor patient progress toward functional and medical goals  Care Tool:  Bathing    Body parts bathed by patient: Right arm, Chest, Abdomen, Left arm, Front perineal area, Right upper leg, Left upper leg, Left lower leg, Face   Body parts bathed by helper: Right lower leg, Buttocks     Bathing  assist Assist Level: Maximal Assistance - Patient 24 - 49%     Upper Body Dressing/Undressing Upper body dressing   What is the patient wearing?: Pull over shirt    Upper body assist Assist Level: Moderate Assistance - Patient 50 - 74%    Lower Body Dressing/Undressing Lower body dressing      What is the patient wearing?: Pants     Lower body assist Assist for lower body dressing: Maximal Assistance - Patient 25 - 49%     Toileting Toileting    Toileting assist Assist for toileting: Dependent - Patient 0%     Transfers Chair/bed transfer  Transfers assist     Chair/bed transfer assist level: Moderate Assistance - Patient 50 - 74%     Locomotion Ambulation   Ambulation assist   Ambulation activity did not occur: Safety/medical concerns          Walk 10 feet activity   Assist  Walk 10 feet activity did not occur: Safety/medical concerns        Walk 50 feet  activity   Assist Walk 50 feet with 2 turns activity did not occur: Safety/medical concerns         Walk 150 feet activity   Assist Walk 150 feet activity did not occur: Safety/medical concerns         Walk 10 feet on uneven surface  activity   Assist Walk 10 feet on uneven surfaces activity did not occur: Safety/medical concerns         Wheelchair     Assist Is the patient using a wheelchair?: Yes Type of Wheelchair: Manual    Wheelchair assist level: Dependent - Patient 0%      Wheelchair 50 feet with 2 turns activity    Assist        Assist Level: Dependent - Patient 0%   Wheelchair 150 feet activity     Assist      Assist Level: Dependent - Patient 0%   Blood pressure 124/64, pulse 67, temperature 97.9 F (36.6 C), temperature source Oral, resp. rate 18, height 5' 2.01" (1.575 m), weight 75.6 kg, SpO2 100 %.    Medical Problem List and Plan: 1. 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  Impaired mobility and ADLs: goal of bed mobility with Mod A this week. Conitnue Lovenox.              -antiplatelet therapy: Aspirin 81 mg daily and Plavix 75 mg daily x90 days then aspirin alone 3. Pain: Lyrica 75 mg twice daily, decrease hydrocodone to 1 tab q8H as needed for severe pain 4. Mood: Provide emotional support             -antipsychotic agents: N/A 5. Neuropsych: This patient is?capable of making decisions on her own behalf. 6. Skin/Wound Care: Routine skin checks 7. Fluids/Electrolytes/Nutrition: Routine in and outs with follow-up chemistries 8.  Right distal fibular metaphysis fracture with minimal lateral  displacement.  Nonsurgical.  Follow-up orthopedic services Dr.Poggi.  Nonweightbearing right lower extremity Pt not using pain meds, no falls, occ  puts a little weight on RLE as per OT but using sling board for transfers , discussed with RN , may need to offer pt tylenol before tharapy  9.  Diabetes mellitus.  Hemoglobin A1c 11.1. Increase Semglee to 20 units daily.  Check blood sugars before meals and at bedtime. Recommended choosing low added sugar/high protein foods. Decrease Novolog sliding scale for hypoglycemia 10.  Seizure prophylaxis.  Keppra 250 mg twice daily. 11.  Permissive hypertension.  Presently on HCTZ 25 mg daily.  Patient on amlodipine 10 mg daily, hydralazine 25 mg 3 times daily, Coreg 25 mg twice daily, Aldactone 150 mg daily PTA.  Vitals:   02/08/22 1921 02/09/22 0509  BP: (!) 147/78 124/64  Pulse: 76 67  Resp: 18 18  Temp: 98.7 F (37.1 C) 97.9 F (36.6 C)  SpO2: 100% 100%  Well controlled 5/30  12.  Diastolic congestive heart failure.  Monitor for any signs of fluid overload  5/27- will check daily weights- will order 13.  AKI on CKD stage III.  Creatinine baseline 1.4-1.7.  Follow-up chemistries Recheck today given poor intake and diuretic use  14.  Hyperlipidemia.  Crestor 15. Constipation- LBM 3-4 days ago- might need additional intervention- hasn't been eating much per pt/nursing.   5/27- will order Sorbitol after therapy today- no BM in days  5/28- Large BM after sorbitol yesterday afternoon- 16. Obesity: BMI 30.72: provide list of foods to help with weight loss.  17. Low protein: discussed choosing high protein and low added sugar foods 18. Poor initiation: start amantadine 100mg  daily 19. Dysphagia: continue D3 trials    LOS: 7 days A FACE TO FACE EVALUATION WAS PERFORMED  Clide Deutscher Indira Sorenson 02/09/2022, 9:56 AM

## 2022-02-10 LAB — GLUCOSE, CAPILLARY
Glucose-Capillary: 179 mg/dL — ABNORMAL HIGH (ref 70–99)
Glucose-Capillary: 112 mg/dL — ABNORMAL HIGH (ref 70–99)
Glucose-Capillary: 281 mg/dL — ABNORMAL HIGH (ref 70–99)
Glucose-Capillary: 185 mg/dL — ABNORMAL HIGH (ref 70–99)

## 2022-02-10 MED ORDER — HYDROCODONE-ACETAMINOPHEN 7.5-325 MG PO TABS: 1.0000 | ORAL_TABLET | Freq: Two times a day (BID) | ORAL | Status: DC | PRN

## 2022-02-10 MED ORDER — POLYETHYLENE GLYCOL 3350 17 G PO PACK
17.0000 g | PACK | Freq: Every day | ORAL | Status: DC | PRN
  Administered 2022-02-12 – 2022-02-15 (×4): 17 g via ORAL
  Filled 2022-02-10 (×4): qty 1

## 2022-02-10 NOTE — Progress Notes (Signed)
SLP Cancellation Note  Patient Details Name: Cathlyn Tersigni MRN: [id number] DOB: 09-Apr-1961   Cancelled treatment:       Patient missed 30 minutes of skilled SLP intervention in [employer] due to fatigue and declining trial tray of regular textures. SLP offered multiple options with patient declining all. When tray arrived, patient declined tray and requested to go back to bed despite encouragement. Patient transferred back to bed per her request. Continue with current plan of care.                                                                                                 Kaitrin Seybold 02/10/2022, 1:08 PM

## 2022-02-10 NOTE — Progress Notes (Signed)
Speech Language Pathology Weekly Progress and Session Note  Patient Details  Name: [name] Gilbert MRN: [id number] Date of Birth: October 03, 1960  Beginning of progress report period: Feb 03, 2022 End of progress report period: February 10, 2022  Short Term Goals: Week 1: SLP Short Term Goal 1 (Week 1): Pt will match object/picture to corresponding word with 100% accuracy given Sup A. SLP Short Term Goal 1 - Progress (Week 1): Met SLP Short Term Goal 2 (Week 1): Pt will verbalize name of object/picture on 50% of trials given Mod A. SLP Short Term Goal 2 - Progress (Week 1): Met SLP Short Term Goal 3 (Week 1): Pt will participate in further cognitive-linguistic and communication assessment to further inform ST POC and d/c planning with 100% completion. SLP Short Term Goal 3 - Progress (Week 1): Not met SLP Short Term Goal 4 (Week 1): Pt will consume Dysphagia 3 diet textures with thin liquids given minimal overt s/sx concerning for aspiration and Min A for use of safe swallowing strategies to include alternating bites and sips. SLP Short Term Goal 4 - Progress (Week 1): Met SLP Short Term Goal 5 (Week 1): Pt will recall daily information x 3 with use of external aids. SLP Short Term Goal 5 - Progress (Week 1): Not met SLP Short Term Goal 6 (Week 1): Pt will follow 1-step commands on 50% of trials given Mod A. SLP Short Term Goal 6 - Progress (Week 1): Met    New Short Term Goals: Week 2: SLP Short Term Goal 1 (Week 2): STG=LTG due to EOS (6/10)  Weekly Progress Updates: Pt made moderate progress meeting 4 out 6 goals. Pt is currently consuming dys 3 textures and thin liquids with min-supervision A verbal cues for swallow strategies. However, PO trials are limited due to pt's preference and refusal to participate in regular texture trial tray after requesting the items. Pt's husband has not been present for treatment sessions, therefore unsure about baseline cognitive skills. Pt demonstrates poor  carryover of orientation, further impacted by visual deficits and limited reading function. Pt is demonstrating improvement in ability to follow 1 step commands, name common objects with mod A cues and produce thoughts at phrase level. Pt's barriers at discharge are continued slow processing, reduced participation at times (flat affect), dysphagia diet, poor recall, impaired expression and unknown cognition baseline. Pt would continue to benefit from skilled ST services in order to maximize functional independence and reduce burden of care, requiring 24 hour supervision at discharge with continued skilled ST services.      Intensity: Minumum of 1-2 x/day, 30 to 90 minutes Frequency: 3 to 5 out of 7 days Duration/Length of Stay: 6/10 Treatment/Interventions: Functional tasks;Cueing hierarchy;Dysphagia/aspiration precaution training;Multimodal communication approach;Patient/family education;Therapeutic Activities;Speech/Language facilitation   Therapy/Group: Individual Therapy  Ornella Coderre  [facility] 02/10/2022, 2:45 PM

## 2022-02-10 NOTE — Progress Notes (Signed)
Physical Therapy Session Note  Patient Details  Name: [name] Gilbert MRN: [id number] Date of Birth: 1961-06-10  Today's Date: 02/10/2022 PT Individual Time: 1015-1055 PT Individual Time Calculation (min): 40 min   Short Term Goals: Week 1:  PT Short Term Goal 1 (Week 1): Pt will complete bed mobility with modA PT Short Term Goal 2 (Week 1): Pt will complete bed<>chair transfers with modA and LRAD PT Short Term Goal 3 (Week 1): Pt will propel wheelchair 50ft with modA  Skilled Therapeutic Interventions/Progress Updates:  Received pt sitting in WC, pt agreeable to PT treatment, and denied any pain at rest but reported increased R foot pain at end of session - RN notified to administer pain medication. Session with emphasis on functional mobility/transfers, generalized strengthening and endurance, and dynamic standing balance/coordination. Pt transported to/from room in [facility] dependently for time management purposes. Worked on blocked practice sit<>stands in // bars x 5 reps with heavy min A fading to heavy CGA. Initially placed RLE on therapist's foot to ensure NWB precautions, however pt unable to adhere to precautions, therefore transitioned to therapist holding RLE in air with transfers. Transported to dayroom and performed LLE strengthening on Kinetron at 20 cm/sec for 1 minute x 4 trials with therapist providing manual counter resistance with emphasis on glute/quad strengthening. Returned to room and pt reluctantly agreed to remain sitting in [facility] for [employer]. Concluded session with pt sitting in WC, needs within reach, and seatbelt alarm on.   Therapy Documentation Precautions:  Precautions Precautions: Fall Restrictions Weight Bearing Restrictions: Yes RLE Weight Bearing: Non weight bearing  Therapy/Group: Individual Therapy Martin Majestic PT, DPT  02/10/2022, 7:09 AM

## 2022-02-10 NOTE — Progress Notes (Signed)
Occupational Therapy Session Note  Patient Details  Name: Jetaun Colbath MRN: [id number] Date of Birth: [birth year]  Today's Date: 02/10/2022 OT Missed Time: 75 Minutes Missed Time Reason: Patient unwilling/refused to participate without medical reason  Pt sitting up in bed, frustrated by "not being offered lunch". Confirmed this was not true and that pt did not want to eat the lunch tray offered. Her husband arrived with lunch from cafeteria and pt began eating this. Turned off TV to minimize distractions per SLP recommendations. Pt adamantly refusing any participation in OT, declining for OT to even return after she finished eating. 75 min missed.   Crissie Reese 02/10/2022, 6:40 AM

## 2022-02-10 NOTE — Progress Notes (Signed)
PROGRESS NOTE   Subjective/Complaints: No new complaints this morning Denies lower extremity pain- will decrease norco to 1 tab q12H prn Moving bowels regularly.   ROS:  Mild aphasia , can speak at short sentence level  Pt denies SOB, abd pain, CP, N/V/C/D, and vision changes, denies right ankle pain   Objective:   No results found. No results for input(s): WBC, HGB, HCT, PLT in the last 72 hours.  Recent Labs    02/09/22 0516  CREATININE 2.43*    Intake/Output Summary (Last 24 hours) at 02/10/2022 1046 Last data filed at 02/09/2022 1259 Gross per 24 hour  Intake 120 ml  Output --  Net 120 ml        Physical Exam: Vital Signs Blood pressure (!) 137/91, pulse 74, temperature (!) 97 F (36.1 C), resp. rate 18, height 5' 2.01" (1.575 m), weight 77.3 kg, SpO2 100 %.  General: No acute distress, BMI 31.16 Mood and affect are appropriate Heart: Regular rate and rhythm no rubs murmurs or extra sounds Lungs: Clear to auscultation, breathing unlabored, no rales or wheezes Abdomen: Positive bowel sounds, soft nontender to palpation, nondistended Extremities: No clubbing, cyanosis, or edema Skin: No evidence of breakdown, no evidence of rash   Musculoskeletal:        General: Swelling present.     Cervical back: Neck supple. No tenderness.     Comments: RUE 3-/5 in biceps, triceps, grip and 1/5 in FA LUE- Biceps 2/5; triceps 3-/5; grip 3-/5 and FA 3-/5 RLE- HF 2-/5; KE 1/5; cannot wiggle toes below cast with ACE wrap LLE- HF 2-/5; KE 2-/5; DF 0/5 and PF 2+/5 Brushing her hair    However spontaneous movement in R side and L side better than tested when reached for remote to use it  Skin:    Comments: RLE cast from toes to just below knee with half cast; half ACE wrap No skin breakdown seen  Neurological:     Comments: Patient is alert.  Disoriented to time. No acute distress.  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 Moderate aphasia noted Decreased to light touch per pt on B/L sides- no difference between both sides  No reading comprehension Poor initiation Psychiatric:     Comments: So extremely flat affect and facies    Assessment/Plan: 1. Functional deficits which require 3+ hours per day of interdisciplinary therapy in a comprehensive inpatient rehab setting. Physiatrist is providing close team supervision and 24 hour management of active medical problems listed below. Physiatrist and rehab team continue to assess barriers to discharge/monitor patient progress toward functional and medical goals  Care Tool:  Bathing    Body parts bathed by patient: Right arm, Chest, Abdomen, Left arm, Front perineal area, Right upper leg, Left upper leg, Left lower leg, Face   Body parts bathed by helper: Right lower leg, Buttocks     Bathing assist Assist Level: Maximal Assistance - Patient 24 - 49%     Upper Body Dressing/Undressing Upper body dressing   What is the patient wearing?: Pull over shirt    Upper body assist Assist Level: Moderate Assistance - Patient 50 - 74%  Lower Body Dressing/Undressing Lower body dressing      What is the patient wearing?: Pants     Lower body assist Assist for lower body dressing: Maximal Assistance - Patient 25 - 49%     Toileting Toileting    Toileting assist Assist for toileting: Dependent - Patient 0%     Transfers Chair/bed transfer  Transfers assist     Chair/bed transfer assist level: Moderate Assistance - Patient 50 - 74%     Locomotion Ambulation   Ambulation assist   Ambulation activity did not occur: Safety/medical concerns          Walk 10 feet activity   Assist  Walk 10 feet activity did not occur: Safety/medical concerns        Walk 50 feet activity   Assist Walk 50 feet with 2 turns activity did not occur: Safety/medical concerns         Walk 150 feet activity   Assist  Walk 150 feet activity did not occur: Safety/medical concerns         Walk 10 feet on uneven surface  activity   Assist Walk 10 feet on uneven surfaces activity did not occur: Safety/medical concerns         Wheelchair     Assist Is the patient using a wheelchair?: Yes Type of Wheelchair: Manual    Wheelchair assist level: Dependent - Patient 0%      Wheelchair 50 feet with 2 turns activity    Assist        Assist Level: Dependent - Patient 0%   Wheelchair 150 feet activity     Assist      Assist Level: Dependent - Patient 0%   Blood pressure (!) 137/91, pulse 74, temperature (!) 97 F (36.1 C), resp. rate 18, height 5' 2.01" (1.575 m), weight 77.3 kg, SpO2 100 %.    Medical Problem List and Plan: 1. 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  Impaired mobility and ADLs: goal of bed mobility with Mod A this week. Conitnue Lovenox.              -antiplatelet therapy: Aspirin 81 mg daily and Plavix 75 mg daily x90 days then aspirin alone 3. Pain: Lyrica 75 mg twice daily, decrease hydrocodone to 1 tab q12H as needed for severe pain 4. Mood: Provide emotional support             -antipsychotic agents: N/A 5. Neuropsych: This patient is?capable of making decisions on her own behalf. 6. Skin/Wound Care: Routine skin checks 7. Fluids/Electrolytes/Nutrition: Routine in and outs with follow-up chemistries 8.  Right distal fibular metaphysis fracture with minimal lateral displacement.  Nonsurgical.  Follow-up orthopedic services Dr.Poggi.  Nonweightbearing right lower extremity Pt not using pain meds, no falls, occ puts a little  weight on RLE as per OT but using sling board for transfers , discussed with RN , may need to offer pt tylenol before tharapy  9.  Diabetes mellitus.  Hemoglobin A1c 11.1. Increase Semglee to 20 units daily.  Check blood sugars before meals  and at bedtime. Recommended choosing low added sugar/high protein foods. Decrease Novolog sliding scale for hypoglycemia. Changed diet to carb modified.  10.  Seizure prophylaxis.  d/c Keppra 250 mg as has completed 16 day course 11.  Permissive hypertension.  Presently on HCTZ 25 mg daily.  Patient on amlodipine 10 mg daily, hydralazine 25 mg 3 times daily, Coreg 25 mg twice daily, Aldactone 150 mg daily PTA.  Vitals:   02/09/22 1930 02/10/22 0322  BP: 139/72 (!) 137/91  Pulse: 78 74  Resp: 18 18  Temp: 98.3 F (36.8 C) (!) 97 F (36.1 C)  SpO2: 100% 100%  Well controlled 5/30  12.  Diastolic congestive heart failure.  Monitor for any signs of fluid overload  5/27- will check daily weights- will order 13.  AKI on CKD stage III.  Creatinine baseline 1.4-1.7.  Follow-up chemistries Recheck today given poor intake and diuretic use  14.  Hyperlipidemia.  Crestor 15. Constipation- LBM 3-4 days ago- might need additional intervention- hasn't been eating much per pt/nursing.   5/27- will order Sorbitol after therapy today- no BM in days  5/28- Large BM after sorbitol yesterday afternoon- 16. Obesity: BMI 30.72: provide list of foods to help with weight loss.  17. Low protein: discussed choosing high protein and low added sugar foods 18. Poor initiation: start amantadine 100mg  daily 19. Dysphagia: continue D3 trials    LOS: 8 days A FACE TO FACE EVALUATION WAS PERFORMED  Clide Deutscher Aneli Zara 02/10/2022, 10:46 AM

## 2022-02-10 NOTE — Progress Notes (Signed)
Occupational Therapy Session Note  Patient Details  Name: [name] Gilbert MRN: [id number] Date of Birth: 1961-05-24  Today's Date: 02/10/2022 OT Individual Time: 0800-0900 OT Individual Time Calculation (min): 60 min    Short Term Goals: Week 2:  OT Short Term Goal 1 (Week 2): STGs=LTGs due to ELOS  Skilled Therapeutic Interventions/Progress Updates:    S: States she is not hungry. When shown the fruit that her daughter had left on her tray, she does agree to eat that.    O: - Bed mobility completed with supervision using the bed railing with HOB flat - Functional transfer: squat pivot completed with max assist from bed to w/c while maintaining WB status. - Self-feeding: Completed seated in w/c with direct supervision to reinforce using right hand as a stabilizer and gross assist when able.  - Wheelchair mobility: Educated patient on using her left leg to cross under her right leg and lift it off the ground when leg rests are not available and prevent her LLE from dragging. Patient required physical set-up of technique and completed with max difficulty. - Grooming: completed seated at sink. Encouraged right hand to reach for toothbrush and toothpaste which were above shoulder level and bring down to her. Utilized both hands to complete task with max difficulty due to lack of coordination in right hand.  - UB bathing and dressing completed at sink level while seated at min assist. Due to lack of coordination in the RUE. - LB bathing completed both seated and standing at min assist.  - LB dressing completed at min assist.  A:  Patient required consistent VC during session to utilize her right hand to participate in ADL tasks. Right hand was used to stabilize fruit container while feeding self with her left hand. During bathing, she did use her LUE to wash her upper body although required assistance for lower body bathing using the LUE. Able to stand at sink with Mod assist initially to  stand. Low standing tolerance with patient requesting to sit within ~ 1 minute. Required assist after brushing her teeth to remove toothpaste from her hair and right side of her cheek. Did not seem to be aware.   P: Continue to work on motor coordination and awareness of the RUE in order to increase independence with self care tasks.   Therapy Documentation Precautions:  Precautions Precautions: Fall Restrictions Weight Bearing Restrictions: Yes RLE Weight Bearing: Non weight bearing  Pain: Pain Assessment Pain Scale: 0-10 Pain Score: 0-No pain   Therapy/Group: Individual Therapy  Limmie Patricia, OTR/L,CBIS  Supplemental OT - [facility]  02/10/2022, 8:59 AM

## 2022-02-10 NOTE — Progress Notes (Signed)
Occupational Therapy Weekly Progress Note  Patient Details  Name: [name] Gilbert MRN: [id number] Date of Birth: [birth year]  Beginning of progress report period: Feb 03, 2022 End of progress report period: February 10, 2022    Patient has met 3 of 3 short term goals.  Pt exhibits more functional use of right hand however RUE ataxia persists making it difficult to complete fine motor or precision reach.  Pt also exhibits motor apraxia during functional transfers and is limited by NWB status of RLE.  Pt is scanning her environment with less cues needed and attending to the left when sitting at sink to complete ADLs which is improved since IE.  She requires supervision for UB bathing and dressing, grooming and feeding (using non-dominant hand), and mod-max assist for LB bathing and dressing.  Pt's family will benefit from family education training to ensure pt has safe assist at discharge.  Patient continues to demonstrate the following deficits: muscle weakness, decreased cardiorespiratoy endurance, impaired timing and sequencing, ataxia, decreased coordination, and decreased motor planning, decreased visual acuity, decreased visual perceptual skills, and decreased visual motor skills, decreased attention to left and right side neglect, decreased initiation, decreased attention, decreased awareness, decreased problem solving, decreased safety awareness, decreased memory, and delayed processing, and decreased sitting balance, decreased standing balance, decreased postural control, hemiplegia, decreased balance strategies, and difficulty maintaining precautions and therefore will continue to benefit from skilled OT intervention to enhance overall performance with BADL.  Patient progressing toward long term goals..  Continue plan of care.  OT Short Term Goals Week 1:  OT Short Term Goal 1 (Week 1): Pt will complete sit<>stand while following WB precautions with mod assist in prep for self care. OT Short  Term Goal 1 - Progress (Week 1): Met OT Short Term Goal 2 (Week 1): Pt will complete toilet transfer with mod assist OT Short Term Goal 2 - Progress (Week 1): Met OT Short Term Goal 3 (Week 1): Pt will donn/doff pants with max assist at sit<>stand level. OT Short Term Goal 3 - Progress (Week 1): Met Week 2:  OT Short Term Goal 1 (Week 2): STGs=LTGs due to ELOS  Therapy Documentation Precautions:  Precautions Precautions: Fall Restrictions Weight Bearing Restrictions: Yes RLE Weight Bearing: Non weight bearing    Therapy/Group: Individual Therapy  Ezekiel Slocumb 02/10/2022, 8:51 AM

## 2022-02-11 LAB — GLUCOSE, CAPILLARY
Glucose-Capillary: 139 mg/dL — ABNORMAL HIGH (ref 70–99)
Glucose-Capillary: 133 mg/dL — ABNORMAL HIGH (ref 70–99)
Glucose-Capillary: 175 mg/dL — ABNORMAL HIGH (ref 70–99)
Glucose-Capillary: 417 mg/dL — ABNORMAL HIGH (ref 70–99)
Glucose-Capillary: 92 mg/dL (ref 70–99)

## 2022-02-11 MED ORDER — INSULIN ASPART 100 UNIT/ML IJ SOLN
0.0000 [IU] | Freq: Every day | INTRAMUSCULAR | Status: DC
  Administered 2022-02-12: 2 [IU] via SUBCUTANEOUS

## 2022-02-11 MED ORDER — HYDROCODONE-ACETAMINOPHEN 7.5-325 MG PO TABS: 1.0000 | ORAL_TABLET | Freq: Every day | ORAL | Status: DC | PRN

## 2022-02-11 MED ORDER — INSULIN ASPART 100 UNIT/ML IJ SOLN
6.0000 [IU] | Freq: Once | INTRAMUSCULAR | Status: AC
Start: 2022-02-11 — End: 2022-02-11
  Administered 2022-02-11: 6 [IU] via SUBCUTANEOUS

## 2022-02-11 MED ORDER — INSULIN GLARGINE-YFGN 100 UNIT/ML ~~LOC~~ SOLN
21.0000 [IU] | Freq: Every day | SUBCUTANEOUS | Status: DC
Start: 2022-02-12 — End: 2022-02-12
  Filled 2022-02-11: qty 0.21

## 2022-02-11 MED ORDER — INSULIN ASPART 100 UNIT/ML IJ SOLN
0.0000 [IU] | Freq: Three times a day (TID) | INTRAMUSCULAR | Status: DC
  Administered 2022-02-12 (×2): 2 [IU] via SUBCUTANEOUS

## 2022-02-11 NOTE — Progress Notes (Signed)
PROGRESS NOTE   Subjective/Complaints: No new complaints this morning.  Pain stable- decrease Norco to daily PRN.  Miralax added for constipation yesterday.   CBGs improved but still elevated to 281 last night  ROS:  Mild aphasia , can speak at short sentence level  Pt denies SOB, abd pain, CP, N/V/C/D, and vision changes, denies right ankle pain   Objective:   No results found. No results for input(s): WBC, HGB, HCT, PLT in the last 72 hours.  Recent Labs    02/09/22 0516  CREATININE 2.43*    Intake/Output Summary (Last 24 hours) at 02/11/2022 1319 Last data filed at 02/11/2022 1300 Gross per 24 hour  Intake 480 ml  Output --  Net 480 ml        Physical Exam: Vital Signs Blood pressure 109/64, pulse 77, temperature 98.6 F (37 C), temperature source Oral, resp. rate 18, height 5' 2.01" (1.575 m), weight 75.4 kg, SpO2 100 %.  General: No acute distress, BMI 30.40 Mood and affect are appropriate Heart: Regular rate and rhythm no rubs murmurs or extra sounds Lungs: Clear to auscultation, breathing unlabored, no rales or wheezes Abdomen: Positive bowel sounds, soft nontender to palpation, nondistended Extremities: No clubbing, cyanosis, or edema Skin: No evidence of breakdown, no evidence of rash   Musculoskeletal:        General: Swelling present.     Cervical back: Neck supple. No tenderness.     Comments: RUE 3-/5 in biceps, triceps, grip and 1/5 in FA LUE- Biceps 2/5; triceps 3-/5; grip 3-/5 and FA 3-/5 RLE- HF 2-/5; KE 1/5; cannot wiggle toes below cast with ACE wrap LLE- HF 2-/5; KE 2-/5; DF 0/5 and PF 2+/5 Brushing her hair    However spontaneous movement in R side and L side better than tested when reached for remote to use it  Skin:    Comments: RLE cast from toes to just below knee with half cast; half ACE wrap No skin breakdown seen  Neurological:     Comments: Patient is alert.  Disoriented to  time. No acute distress.  She does display mild to moderate expressive receptive aphasia. Moderate aphasia noted Decreased to light touch per pt on B/L sides- no difference between both sides  No reading comprehension Poor initiation Psychiatric:     Comments: So extremely flat affect and facies    Assessment/Plan: 1. Functional deficits which require 3+ hours per day of interdisciplinary therapy in a comprehensive inpatient rehab setting. Physiatrist is providing close team supervision and 24 hour management of active medical problems listed below. Physiatrist and rehab team continue to assess barriers to discharge/monitor patient progress toward functional and medical goals  Care Tool:  Bathing    Body parts bathed by patient: Right arm, Chest, Abdomen, Left arm, Front perineal area, Right upper leg, Left upper leg, Left lower leg, Face   Body parts bathed by helper: Right lower leg, Buttocks     Bathing assist Assist Level: Maximal Assistance - Patient 24 - 49%     Upper Body Dressing/Undressing Upper body dressing   What is the patient wearing?: Pull over shirt    Upper body assist Assist Level:  Moderate Assistance - Patient 50 - 74%    Lower Body Dressing/Undressing Lower body dressing      What is the patient wearing?: Pants     Lower body assist Assist for lower body dressing: Maximal Assistance - Patient 25 - 49%     Toileting Toileting    Toileting assist Assist for toileting: Dependent - Patient 0%     Transfers Chair/bed transfer  Transfers assist     Chair/bed transfer assist level: Minimal Assistance - Patient > 75% (// bars)     Locomotion Ambulation   Ambulation assist   Ambulation activity did not occur: Safety/medical concerns          Walk 10 feet activity   Assist  Walk 10 feet activity did not occur: Safety/medical concerns        Walk 50 feet activity   Assist Walk 50 feet with 2 turns activity did not occur:  Safety/medical concerns         Walk 150 feet activity   Assist Walk 150 feet activity did not occur: Safety/medical concerns         Walk 10 feet on uneven surface  activity   Assist Walk 10 feet on uneven surfaces activity did not occur: Safety/medical concerns         Wheelchair     Assist Is the patient using a wheelchair?: Yes Type of Wheelchair: Manual    Wheelchair assist level: Dependent - Patient 0%      Wheelchair 50 feet with 2 turns activity    Assist        Assist Level: Dependent - Patient 0%   Wheelchair 150 feet activity     Assist      Assist Level: Dependent - Patient 0%   Blood pressure 109/64, pulse 77, temperature 98.6 F (37 C), temperature source Oral, resp. rate 18, height 5' 2.01" (1.575 m), weight 75.4 kg, SpO2 100 %.    Medical Problem List and Plan: 1. 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  Impaired mobility and ADLs: goal of bed mobility with Mod A this week. Continue Lovenox.              -antiplatelet therapy: Aspirin 81 mg daily and Plavix 75 mg daily x90 days then aspirin alone 3. Pain: Lyrica 75 mg twice daily, decrease hydrocodone to 1 tab daily as needed for severe pain 4. Mood: Provide emotional support             -antipsychotic agents: N/A 5. Neuropsych: This patient is?capable of making decisions on her own behalf. 6. Skin/Wound Care: Routine skin checks 7. Fluids/Electrolytes/Nutrition: Routine in and outs with follow-up chemistries 8.  Right distal fibular metaphysis fracture with minimal lateral displacement.  Nonsurgical.  Follow-up orthopedic services Dr.Poggi.   Nonweightbearing right lower extremity Pt not using pain meds, no falls, occ puts a little weight on RLE as per OT but using sling board for transfers , discussed with RN , may need to offer pt tylenol before tharapy  9.  Diabetes mellitus.  Hemoglobin A1c 11.1.  Increase Semglee to 21 units daily.  Check blood sugars before meals and at bedtime. Recommended choosing low added sugar/high protein foods. Decrease Novolog sliding scale for hypoglycemia. Changed diet to carb modified.  10.  Seizure prophylaxis.  d/c Keppra 250 mg as has completed 16 day course 11.  Permissive hypertension.  Presently on HCTZ 25 mg daily.  Patient on amlodipine 10 mg daily, hydralazine 25 mg 3 times daily, Coreg 25 mg twice daily, Aldactone 150 mg daily PTA.  Vitals:   02/10/22 1939 02/11/22 0418  BP: (!) 143/71 109/64  Pulse: 77 77  Resp: 19 18  Temp: 98.6 F (37 C) 98.6 F (37 C)  SpO2: 100% 100%  Well controlled 5/30  12.  Diastolic congestive heart failure.  Monitor for any signs of fluid overload  5/27- will check daily weights- will order 13.  AKI on CKD stage III.  Creatinine baseline 1.4-1.7.  Follow-up chemistries Recheck today given poor intake and diuretic use  14.  Hyperlipidemia.  Crestor 15. Constipation- LBM 3-4 days ago- might need additional intervention- hasn't been eating much per pt/nursing.   5/27- will order Sorbitol after therapy today- no BM in days  5/28- Large BM after sorbitol yesterday afternoon- 16. Obesity: BMI 30.72: provide list of foods to help with weight loss.  17. Low protein: discussed choosing high protein and low added sugar foods 18. Poor initiation: start amantadine 100mg  daily 19. Dysphagia: continue D3 trials    LOS: 9 days A FACE TO FACE EVALUATION WAS PERFORMED  [name] Gilbert 02/11/2022, 1:19 PM

## 2022-02-11 NOTE — Progress Notes (Signed)
Physical Therapy Session Note  Patient Details  Name: [name] Gilbert MRN: [id number] Date of Birth: December 08, 1960  Today's Date: 02/11/2022 PT Individual Time: 1007-1104 PT Individual Time Calculation (min): 57 min   Short Term Goals: Week 1:  PT Short Term Goal 1 (Week 1): Pt will complete bed mobility with modA PT Short Term Goal 2 (Week 1): Pt will complete bed<>chair transfers with modA and LRAD PT Short Term Goal 3 (Week 1): Pt will propel wheelchair 77ft with modA  Skilled Therapeutic Interventions/Progress Updates:    Pt received supine in bed awake and agreeable to therapy session. Pt initiated transitioning over to R EOB, HOB elevated and using bedrails, prior to therapist being able to move lower bedrail out of her way, demoing poor awareness - does so with supervision and verbal cuing. Pt unable to recall "weight bearing restrictions" but when asked about R LE she was able to say that she can't put weight through that LE. L partial stand pivot transfer EOB>w/c having pt hold R LE up off floor with assist and light mod assist for lifting and balance while turning.   B UE w/c propulsion ~169ft with total progressed to light mod assist - hand-over-hand facilitation for R UE due to impaired sensation/proprioception and requiring step-by-step cuing for "bringing arms back, grabbing rims, and pushing" with therapist providing continuous tactile cuing for sequencing.  Block practice x2 squat pivot transfers w/c<>EOM targeting motor planning/sequencing while maintaining R LE NWBing - therapist holding up pt's R LE on her leg/foot to ensure WBing restrictions and providing mod assist for lifting/pivoting hips - requires max cuing to ensure R hand placement correct prior to initiating the transfer - noticed air cushion making it more challenging to complete transfer due to instability, but pt needs a pressure relieving cushion if staying up in w/c   Swapped out roho cushion with foam cushion to  provide pt with more stability to trial during transfers.  Block practice more of a partial stand pivot transfer w/c<>EOM and pt noticed to have improved motor planning and sequencing of transfer when coming into more of a standing posture as opposed to more of a lateral scoot transfer; however, she is very inconsistent in her motor planning of the transfer and would benefit from continued blocked practice - does better when scoots hips forward towards front of w/c prior to initiating the transfer  Transported back to room and pt requesting to return to bed. R squat pivot w/c>EOB as described above. Sit>supine with supervision. Pt left supine in bed with needs in reach and bed alarm on.   Therapy Documentation Precautions:  Precautions Precautions: Fall Restrictions Weight Bearing Restrictions: Yes RLE Weight Bearing: Non weight bearing   Pain:  Denies pain during session.    Therapy/Group: Individual Therapy  Tawana Scale , PT, DPT, NCS, CSRS 02/11/2022, 7:56 AM

## 2022-02-11 NOTE — Progress Notes (Signed)
Speech Language Pathology Daily Session Note  Patient Details  Name: Bexleigh Theriault MRN: [id number] Date of Birth: 19-Nov-1960  Today's Date: 02/11/2022 SLP Individual Time: 1348-1415 SLP Individual Time Calculation (min): 27 min  Short Term Goals: Week 2: SLP Short Term Goal 1 (Week 2): STG=LTG due to EOS (6/10)  Skilled Therapeutic Interventions:  Pt seen for skilled ST with focus on cognitive goals, pt in bed upright and alert, agreeable to therapeutic tasks. SLP facilitating recall of 4 pieces of functional information from earlier today by providing max A cues. Pt participating in naming task with high frequency objects in room given overall mod A cues for 50% accuracy. Pt benefits from extra time to communicate mildly complex thoughts, enjoys speaking about her dog, and is able to effectively communicate wants/needs at this time. Pt left in bed with alarm set and all needs within reach. Cont ST POC.  Pain Pain Assessment Pain Scale: 0-10 Pain Score: 0-No pain  Therapy/Group: Individual Therapy  Tacey Ruiz 02/11/2022, 3:09 PM

## 2022-02-11 NOTE — Progress Notes (Signed)
Received call from bedside nurse for CBG of 417.  Dr. Carlis Abbott, MD notified with order placed to cover HS value with SSI.  Nurse can give 6U to cover this CBG level.

## 2022-02-12 LAB — GLUCOSE, CAPILLARY
Glucose-Capillary: 79 mg/dL (ref 70–99)
Glucose-Capillary: 184 mg/dL — ABNORMAL HIGH (ref 70–99)
Glucose-Capillary: 186 mg/dL — ABNORMAL HIGH (ref 70–99)
Glucose-Capillary: 239 mg/dL — ABNORMAL HIGH (ref 70–99)

## 2022-02-12 MED ORDER — INSULIN GLARGINE-YFGN 100 UNIT/ML ~~LOC~~ SOLN
10.0000 [IU] | Freq: Every day | SUBCUTANEOUS | Status: DC
Start: 2022-02-12 — End: 2022-02-13
  Administered 2022-02-12: 10 [IU] via SUBCUTANEOUS
  Filled 2022-02-12: qty 0.1

## 2022-02-12 MED ORDER — INSULIN GLARGINE-YFGN 100 UNIT/ML ~~LOC~~ SOLN
11.0000 [IU] | Freq: Every day | SUBCUTANEOUS | Status: DC
  Administered 2022-02-12 – 2022-02-15 (×4): 11 [IU] via SUBCUTANEOUS
  Filled 2022-02-12 (×4): qty 0.11

## 2022-02-12 MED ORDER — DOCUSATE SODIUM 100 MG PO CAPS
100.0000 mg | ORAL_CAPSULE | Freq: Every day | ORAL | Status: DC
  Administered 2022-02-12 – 2022-02-14 (×3): 100 mg via ORAL
  Filled 2022-02-12 (×3): qty 1

## 2022-02-12 NOTE — Progress Notes (Signed)
PROGRESS NOTE   Subjective/Complaints: Still constipated, not uncomfortable, colace added and provided list of foods for constipation, weight loss, diabetes, and pain. Pain stable right now CBG 417 last night and 79 this AM, increased Lantus to 11U BID  ROS:  Mild aphasia , can speak at short sentence level  Pt denies SOB, abd pain, CP, N/V/C/D, and vision changes, denies right ankle pain   Objective:   No results found. No results for input(s): WBC, HGB, HCT, PLT in the last 72 hours.  No results for input(s): NA, K, CL, CO2, GLUCOSE, BUN, CREATININE, CALCIUM in the last 72 hours.   Intake/Output Summary (Last 24 hours) at 02/12/2022 0955 Last data filed at 02/12/2022 0758 Gross per 24 hour  Intake 495 ml  Output --  Net 495 ml        Physical Exam: Vital Signs Blood pressure (!) 110/56, pulse 70, temperature 98.6 F (37 C), resp. rate 18, height 5' 2.01" (1.575 m), weight 74.1 kg, SpO2 100 %.  General: No acute distress, BMI 30.40 Mood and affect are appropriate Heart: Regular rate and rhythm no rubs murmurs or extra sounds Lungs: Clear to auscultation, breathing unlabored, no rales or wheezes Abdomen: Positive bowel sounds, soft nontender to palpation, nondistended Extremities: No clubbing, cyanosis, or edema Skin: No evidence of breakdown, no evidence of rash   Musculoskeletal:        General: Swelling present.     Cervical back: Neck supple. No tenderness.     Comments: RUE 3-/5 in biceps, triceps, grip and 1/5 in FA LUE- Biceps 2/5; triceps 3-/5; grip 3-/5 and FA 3-/5 RLE- HF 2-/5; KE 1/5; cannot wiggle toes below cast with ACE wrap LLE- HF 2-/5; KE 2-/5; DF 0/5 and PF 2+/5 Brushing her hair    However spontaneous movement in R side and L side better than tested when reached for remote to use it  Skin:    Comments: RLE cast from toes to just below knee with half cast; half ACE wrap No skin breakdown  seen  Neurological:     Comments: Patient is alert.  Disoriented to time. No acute distress.  She does display mild to moderate expressive receptive aphasia. Moderate aphasia noted Decreased to light touch per pt on B/L sides- no difference between both sides  No reading comprehension Poor initiation Max cues for recall Psychiatric:     Comments: So extremely flat affect and facies    Assessment/Plan: 1. Functional deficits which require 3+ hours per day of interdisciplinary therapy in a comprehensive inpatient rehab setting. Physiatrist is providing close team supervision and 24 hour management of active medical problems listed below. Physiatrist and rehab team continue to assess barriers to discharge/monitor patient progress toward functional and medical goals  Care Tool:  Bathing    Body parts bathed by patient: Right arm, Chest, Abdomen, Left arm, Front perineal area, Right upper leg, Left upper leg, Left lower leg, Face   Body parts bathed by helper: Right lower leg, Buttocks     Bathing assist Assist Level: Maximal Assistance - Patient 24 - 49%     Upper Body Dressing/Undressing Upper body dressing   What is the  patient wearing?: Pull over shirt    Upper body assist Assist Level: Moderate Assistance - Patient 50 - 74%    Lower Body Dressing/Undressing Lower body dressing      What is the patient wearing?: Pants     Lower body assist Assist for lower body dressing: Maximal Assistance - Patient 25 - 49%     Toileting Toileting    Toileting assist Assist for toileting: Dependent - Patient 0%     Transfers Chair/bed transfer  Transfers assist     Chair/bed transfer assist level: Minimal Assistance - Patient > 75% (// bars)     Locomotion Ambulation   Ambulation assist   Ambulation activity did not occur: Safety/medical concerns          Walk 10 feet activity   Assist  Walk 10 feet activity did not occur: Safety/medical concerns         Walk 50 feet activity   Assist Walk 50 feet with 2 turns activity did not occur: Safety/medical concerns         Walk 150 feet activity   Assist Walk 150 feet activity did not occur: Safety/medical concerns         Walk 10 feet on uneven surface  activity   Assist Walk 10 feet on uneven surfaces activity did not occur: Safety/medical concerns         Wheelchair     Assist Is the patient using a wheelchair?: Yes Type of Wheelchair: Manual    Wheelchair assist level: Dependent - Patient 0%      Wheelchair 50 feet with 2 turns activity    Assist        Assist Level: Dependent - Patient 0%   Wheelchair 150 feet activity     Assist      Assist Level: Dependent - Patient 0%   Blood pressure (!) 110/56, pulse 70, temperature 98.6 F (37 C), resp. rate 18, height 5' 2.01" (1.575 m), weight 74.1 kg, SpO2 100 %.    Medical Problem List and Plan: 1. 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  Impaired mobility and ADLs: goal of bed mobility with Mod A this week. Continue Lovenox.              -antiplatelet therapy: Aspirin 81 mg daily and Plavix 75 mg daily x90 days then aspirin alone 3. Pain: Lyrica 75 mg twice daily, discontinue hydrocodone. 4. Mood: Provide emotional support             -antipsychotic agents: N/A 5. Neuropsych: This patient is?capable of making decisions on her own behalf. 6. Skin/Wound Care: Routine skin checks 7. Fluids/Electrolytes/Nutrition: Routine in and outs with follow-up chemistries 8.  Right distal fibular metaphysis fracture with minimal lateral displacement.  Nonsurgical.  Follow-up  orthopedic services Dr.Poggi.  Nonweightbearing right lower extremity Pt not using pain meds, no falls, occ puts a little weight on RLE as per OT but using sling board for transfers , discussed with RN , may need to offer pt tylenol before tharapy  9.  Diabetes mellitus.  Hemoglobin  A1c 11.1. Increase Semglee to 11U BID. Check blood sugars before meals and at bedtime. Recommended choosing low added sugar/high protein foods. Decrease Novolog sliding scale for hypoglycemia. Changed diet to carb modified.  10.  Seizure prophylaxis.  d/c Keppra 250 mg as has completed 16 day course 11.  Permissive hypertension.  Presently on HCTZ 25 mg daily.  Patient on amlodipine 10 mg daily, hydralazine 25 mg 3 times daily, Coreg 25 mg twice daily, Aldactone 150 mg daily PTA.  Vitals:   02/11/22 2025 02/12/22 0304  BP: (!) 150/72 (!) 110/56  Pulse: 74 70  Resp: 16 18  Temp: 98.2 F (36.8 C) 98.6 F (37 C)  SpO2: 99% 100%  Well controlled 5/30  12.  Diastolic congestive heart failure.  Monitor for any signs of fluid overload  5/27- will check daily weights- will order 13.  AKI on CKD stage III.  Creatinine baseline 1.4-1.7.  Follow-up chemistries Recheck today given poor intake and diuretic use  14.  Hyperlipidemia.  Crestor 15. Constipation- add colace daily.  16. Obesity: BMI 30.72-->29.87: provided list of foods to help with weight loss.  17. Low protein: discussed choosing high protein and low added sugar foods 18. Poor initiation: start amantadine 100mg  daily 19. Dysphagia: continue D3 trials    LOS: 10 days A FACE TO FACE EVALUATION WAS PERFORMED  Clide Deutscher Bev Drennen 02/12/2022, 9:55 AM

## 2022-02-13 DIAGNOSIS — K5900 Constipation, unspecified: Secondary | ICD-10-CM

## 2022-02-13 DIAGNOSIS — E1169 Type 2 diabetes mellitus with other specified complication: Secondary | ICD-10-CM

## 2022-02-13 LAB — GLUCOSE, CAPILLARY
Glucose-Capillary: 40 mg/dL — CL (ref 70–99)
Glucose-Capillary: 100 mg/dL — ABNORMAL HIGH (ref 70–99)
Glucose-Capillary: 117 mg/dL — ABNORMAL HIGH (ref 70–99)
Glucose-Capillary: 140 mg/dL — ABNORMAL HIGH (ref 70–99)
Glucose-Capillary: 240 mg/dL — ABNORMAL HIGH (ref 70–99)

## 2022-02-13 MED ORDER — INSULIN ASPART 100 UNIT/ML IJ SOLN: 0.0000 [IU] | Freq: Three times a day (TID) | INTRAMUSCULAR | Status: DC

## 2022-02-13 MED ORDER — BISACODYL 10 MG RE SUPP: 10.0000 mg | Freq: Every day | RECTAL | Status: DC | PRN

## 2022-02-13 MED ORDER — INSULIN ASPART 100 UNIT/ML IJ SOLN
0.0000 [IU] | Freq: Every day | INTRAMUSCULAR | Status: DC
  Administered 2022-02-13: 2 [IU] via SUBCUTANEOUS

## 2022-02-13 MED ORDER — GLUCAGON HCL RDNA (DIAGNOSTIC) 1 MG IJ SOLR
1.0000 mg | INTRAMUSCULAR | Status: AC
Start: 2022-02-13 — End: 2022-02-13
  Administered 2022-02-13: 1 mg via INTRAMUSCULAR

## 2022-02-13 MED ORDER — GLUCAGON HCL RDNA (DIAGNOSTIC) 1 MG IJ SOLR
1.0000 mg | Freq: Once | INTRAMUSCULAR | Status: AC | PRN
  Administered 2022-02-14: 1 mg via INTRAMUSCULAR
  Filled 2022-02-13: qty 1

## 2022-02-13 MED ORDER — SORBITOL 70 % SOLN
30.0000 mL | Freq: Once | Status: AC
  Administered 2022-02-14: 30 mL via ORAL
  Filled 2022-02-13 (×2): qty 30

## 2022-02-13 MED ORDER — GLUCAGON HCL RDNA (DIAGNOSTIC) 1 MG IJ SOLR
INTRAMUSCULAR | Status: AC
  Filled 2022-02-13: qty 1

## 2022-02-13 NOTE — Progress Notes (Signed)
Received call from bedside nurse Corazon, of hypoglycemic event.  Pt has CBG of 40 at 0520 with 1 mg IM glucagon administered.  Follow-up CBG 100 at 0603.  Contacted Dr. Carlis Abbott, MD with recommendations provided to discontinue the evening dose of 10 U Semglee.  If patient CBG remains above 100, daytime nurse may administer the morning dose of 11U Semglee per Dr. Carlis Abbott, MD.

## 2022-02-13 NOTE — Progress Notes (Addendum)
Speech Language Pathology Daily Session Note  Patient Details  Name: [name] Gilbert MRN: [id number] Date of Birth: 21-Jul-1961  Today's Date: 02/13/2022 SLP Individual Time: 1105-1200 SLP Individual Time Calculation (min): 55 min  Short Term Goals: Week 2: SLP Short Term Goal 1 (Week 2): STG=LTG due to EOS (6/10)  Skilled Therapeutic Interventions: Pt seen this date for skilled ST intervention targeting deglutition and communication goals outlined in care plan. Pt received awake/alert and semi-reclined in bed. Agreeable to ST intervention.   Pt participated in receptive ID and confrontation naming task with 90% accuracy given overall Min-Mod verbal A for use of naming hierarchy. Convergent naming completed with 100% accuracy given Sup A faded to Mod I. Continues to require Max A for divergent naming. Max A for orientation and Mod A for reading single words. Recalled date following delay with distractions given Min-Mod verbal A. Pt reports processing speed and naming difficulties are worse than baseline. Re: deglutition, pt continues with prolonged mastication of regular solid textures, though no concerns for aspiration. Implemented aspiration precautions with Min to Sup A. Will trial low-tech AAC board next session to determine if this is a helpful modality to express wants, needs, and ideas, in addition to regular trial tray; pt agreeable.  Pt left in bed with all safety measures activated. Call bell reviewed and within reach and al immediate needs addressed. Continue per current ST POC.   Pain No pain reported  Therapy/Group: Individual Therapy  Vergia Chea A Letanya Froh 02/13/2022, 11:58 AM

## 2022-02-13 NOTE — Progress Notes (Signed)
PROGRESS NOTE   Subjective/Complaints: Hypoglycemia this AM. No additional concerns this AM.   ROS:  Pt denies SOB, abd pain, CP, N/V/C/D, and vision changes,no headache   Objective:   No results found. No results for input(s): WBC, HGB, HCT, PLT in the last 72 hours.  No results for input(s): NA, K, CL, CO2, GLUCOSE, BUN, CREATININE, CALCIUM in the last 72 hours.   Intake/Output Summary (Last 24 hours) at 02/13/2022 1130 Last data filed at 02/13/2022 0900 Gross per 24 hour  Intake 620 ml  Output --  Net 620 ml         Physical Exam: Vital Signs Blood pressure (!) 114/59, pulse 71, temperature 98 F (36.7 C), resp. rate 18, height 5' 2.01" (1.575 m), weight 75.1 kg, SpO2 100 %.  General: No acute distress Mood and affect are appropriate Heart: Regular rate and rhythm no rubs murmurs or extra sounds Lungs: Clear to auscultation, no rales or wheezes, normal effort Abdomen: Positive bowel sounds, soft nontender to palpation, nondistended Extremities: No clubbing, cyanosis, or edema Skin: No evidence of breakdown, no evidence of rash   Musculoskeletal:        General: Alert, in bed    Cervical back: Neck supple. No tenderness.     Comments: RUE 3-/5 in biceps, triceps, grip and 1/5 in FA LUE- Biceps 2/5; triceps 3-/5; grip 3-/5 and FA 3-/5 RLE- HF 2-/5; KE 1/5; cannot wiggle toes below cast with ACE wrap LLE- HF 2-/5; KE 2-/5; DF 0/5 and PF 2+/5    However spontaneous movement in R side and L side better than tested when reached for remote to use it  Skin:    Comments: RLE cast from toes to just below knee with half cast; half ACE wrap No skin breakdown seen  Neurological:     Comments: Patient is alert.  Disoriented to time. No acute distress.  She does display mild to moderate expressive receptive aphasia. Moderate aphasia noted Decreased to light touch per pt on B/L sides- no difference between both sides   No reading comprehension Poor initiation Max cues for recall Psychiatric:     Comments: So extremely flat affect and facies    Assessment/Plan: 1. Functional deficits which require 3+ hours per day of interdisciplinary therapy in a comprehensive inpatient rehab setting. Physiatrist is providing close team supervision and 24 hour management of active medical problems listed below. Physiatrist and rehab team continue to assess barriers to discharge/monitor patient progress toward functional and medical goals  Care Tool:  Bathing    Body parts bathed by patient: Right arm, Chest, Abdomen, Left arm, Front perineal area, Right upper leg, Left upper leg, Left lower leg, Face   Body parts bathed by helper: Right lower leg, Buttocks     Bathing assist Assist Level: Maximal Assistance - Patient 24 - 49%     Upper Body Dressing/Undressing Upper body dressing   What is the patient wearing?: Pull over shirt    Upper body assist Assist Level: Moderate Assistance - Patient 50 - 74%    Lower Body Dressing/Undressing Lower body dressing      What is the patient wearing?: Pants  Lower body assist Assist for lower body dressing: Maximal Assistance - Patient 25 - 49%     Toileting Toileting    Toileting assist Assist for toileting: Dependent - Patient 0%     Transfers Chair/bed transfer  Transfers assist     Chair/bed transfer assist level: Minimal Assistance - Patient > 75% (// bars)     Locomotion Ambulation   Ambulation assist   Ambulation activity did not occur: Safety/medical concerns          Walk 10 feet activity   Assist  Walk 10 feet activity did not occur: Safety/medical concerns        Walk 50 feet activity   Assist Walk 50 feet with 2 turns activity did not occur: Safety/medical concerns         Walk 150 feet activity   Assist Walk 150 feet activity did not occur: Safety/medical concerns         Walk 10 feet on uneven surface   activity   Assist Walk 10 feet on uneven surfaces activity did not occur: Safety/medical concerns         Wheelchair     Assist Is the patient using a wheelchair?: Yes Type of Wheelchair: Manual    Wheelchair assist level: Dependent - Patient 0%      Wheelchair 50 feet with 2 turns activity    Assist        Assist Level: Dependent - Patient 0%   Wheelchair 150 feet activity     Assist      Assist Level: Dependent - Patient 0%   Blood pressure (!) 114/59, pulse 71, temperature 98 F (36.7 C), resp. rate 18, height 5' 2.01" (1.575 m), weight 75.1 kg, SpO2 100 %.    Medical Problem List and Plan: 1. Functional deficits secondary to large acute to subacute infarct posterior medial left temporal and occipital lobe and left thalamus in the setting of multiple intracranial stenosis as well as history of CVA with left-sided weakness/aphasia             -patient may  shower- bag RLE             -ELOS/Goals: 14-16 days- min A to supervision  Continue CIR- PT, OT and SLP  -She was able to don doff shirt and bathe UB at sink with supervision 2.  Impaired mobility and ADLs: goal of bed mobility with Mod A this week. Continue Lovenox.              -antiplatelet therapy: Aspirin 81 mg daily and Plavix 75 mg daily x90 days then aspirin alone 3. Pain: Lyrica 75 mg twice daily, discontinue hydrocodone. 4. Mood: Provide emotional support             -antipsychotic agents: N/A 5. Neuropsych: This patient is?capable of making decisions on her own behalf. 6. Skin/Wound Care: Routine skin checks 7. Fluids/Electrolytes/Nutrition: Routine in and outs with follow-up chemistries 8.  Right distal fibular metaphysis fracture with minimal lateral displacement.  Nonsurgical.  Follow-up orthopedic services Dr.Poggi.  Nonweightbearing right lower extremity Pt not using pain meds, no falls, occ puts a little weight on RLE as per OT but using sling board for transfers , discussed with  RN , may need to offer pt tylenol before tharapy  9.  Diabetes mellitus.  Hemoglobin A1c 11.1. Increase Semglee to 11U BID. Check blood sugars before meals and at bedtime. Recommended choosing low added sugar/high protein foods. Decrease Novolog sliding scale for hypoglycemia. Changed  diet to carb modified.  -6/5 Her Semglee was changed to daily from BID after hypoglycemia, monitor CBG 10.  Seizure prophylaxis.  d/c Keppra 250 mg as has completed 16 day course 11.  Permissive hypertension.  Presently on HCTZ 25 mg daily.  Patient on amlodipine 10 mg daily, hydralazine 25 mg 3 times daily, Coreg 25 mg twice daily, Aldactone 150 mg daily PTA.  Vitals:   02/12/22 1935 02/13/22 0346  BP: (!) 157/74 (!) 114/59  Pulse: 77 71  Resp: 16 18  Temp: 98.1 F (36.7 C) 98 F (36.7 C)  SpO2: 100% 100%  Well controlled 5/30 12.  Diastolic congestive heart failure.  Monitor for any signs of fluid overload  5/27- will check daily weights- will order 13.  AKI on CKD stage III.  Creatinine baseline 1.4-1.7.  Follow-up chemistries - Recheck labs tomorrow 14.  Hyperlipidemia.  Crestor 15. Constipation- add colace daily.  -6/5 sorbitol ordered x1, prn dulcolax 16. Obesity: BMI 30.72-->29.87: provided list of foods to help with weight loss.  17. Low protein: discussed choosing high protein and low added sugar foods 18. Poor initiation: start amantadine 100mg  daily 19. Dysphagia: continue D3 trials    LOS: 11 days A FACE TO FACE EVALUATION WAS PERFORMED  Jennye Boroughs 02/13/2022, 11:30 AM

## 2022-02-13 NOTE — Progress Notes (Signed)
Occupational Therapy Session Note  Patient Details  Name: [name] Gilbert MRN: [id number] Date of Birth: 1961/09/02  Today's Date: 02/13/2022 OT Individual Time: [id number] OT Individual Time Calculation (min): 50 min    Short Term Goals: Week 2:  OT Short Term Goal 1 (Week 2): STGs=LTGs due to ELOS   Skilled Therapeutic Interventions/Progress Updates:    Subjective: Pt states she would like to wash up at the sink.  No c/o pain in right ankle this session.   Objective:  Pt semi reclined in bed. Supine to sit with supervision. Squat pivot to w/c with mod assist.  Pt doffed/donned shirt, bathed UB seated at sink with supervision. Needed min-mod cues to promote self hand over hand of RUE during functional use to control ataxia while also encouraging use of dominant arm.  Pt brushed teeth with min assist to apply toothpaste to toothbrush and attempted with RUE but switched to LUE due to signficant ataxic movements unable to complete successfully with dominant hand.  Pt donned pants with overall max assist at sit<>stand level with difficulty maintaining NWB RLE needing manual assist to keep off floor. Pt requesting to sit in recliner and attempted stand pivot using RW however due to poor motor planning discontinued for safety concerns.  Pt completed modified stand pivot with therapist standing anterior to pt with mod assist.  Pt requesting to spend remainder of session eating breakfast.  Pt required setup of meal tray. Call bell in reach, seat alarm on, BLE elevated.    Assessment:  Primary barriers today included poor motor planning and ataxia RUE/RLE.   Plan: Pt would benefit from further training on blocked practice functional transfers.   Therapy Documentation Precautions:  Precautions Precautions: Fall Restrictions Weight Bearing Restrictions: Yes RLE Weight Bearing: Non weight bearing    Therapy/Group: Individual Therapy  Ezekiel Slocumb 02/13/2022, 9:20 AM

## 2022-02-13 NOTE — Progress Notes (Addendum)
Last recorded BM is 5/27. Offered sorbitol after therapies. Pt refused stating "I'll go when I am ready to go." Educated on benefits of regular bowel emptying and the risk of impaction. Also offered prune juice, pt continues to refuse.   Marylu Lund, RN

## 2022-02-13 NOTE — Progress Notes (Signed)
Hypoglycemic Event  CBG: 40  Treatment: Glucagon IM 1 mg  Symptoms: None  Follow-up CBG: Time 603 CBG Result:100  Possible Reasons for Event: Unknown  Comments/MD notified:yes    Reatha Sur, [employer]

## 2022-02-13 NOTE — Progress Notes (Signed)
Rn called Luetta Nutting regarding patient's blood sugar of 40. Glucagon IM given and rechecked 15 min after. Sugar was 100. Also, per Lattie Haw ok to give Semglee this morning. Will give update on day shift rn.

## 2022-02-13 NOTE — Progress Notes (Signed)
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  Given renal status would decrease correction to Novolog 0-6 units TID.  Thanks, Lujean Rave, MSN, RNC-OB Diabetes Coordinator [phone] (8a-5p)

## 2022-02-13 NOTE — Progress Notes (Signed)
Physical Therapy Weekly Progress Note  Patient Details  Name: [name] Gilbert MRN: [id number] Date of Birth: 1961-08-25  Beginning of progress report period: Feb 03, 2022 End of progress report period: February 10, 2022  Today's Date: 02/13/2022 PT Individual Time: 0915-1025 PT Individual Time Calculation (min): 70 min   Patient has met 2 of 3 short term goals.  Pt making slow progress towards goals this reporting period. Overall, she can complete bed mobility with supervision with hospital bed features, requires consistently modA for squat<>pivot transfers, and requires totalA for w/c mobility with assist required for parts/management. She has been nonambulatory due to NWB status on RLE, motor apraxia, global weakness and deconditioning, and cognitive deficits. Anticipate she will be wheelchair level at discharge with wheelchair consult scheduled with [employer] services.   Patient continues to demonstrate the following deficits muscle weakness and muscle joint tightness, decreased cardiorespiratoy endurance, impaired timing and sequencing, motor apraxia, ataxia, decreased coordination, and decreased motor planning, decreased motor planning, decreased initiation, decreased attention, decreased awareness, decreased problem solving, decreased safety awareness, decreased memory, and delayed processing, and decreased sitting balance, decreased standing balance, decreased postural control, hemiplegia, and decreased balance strategies and therefore will continue to benefit from skilled PT intervention to increase functional independence with mobility.  Patient progressing toward long term goals..  Continue plan of care.  PT Short Term Goals Week 1:  PT Short Term Goal 1 (Week 1): Pt will complete bed mobility with modA PT Short Term Goal 1 - Progress (Week 1): Met PT Short Term Goal 2 (Week 1): Pt will complete bed<>chair transfers with modA and LRAD PT Short Term Goal 2 - Progress (Week 1): Met PT Short  Term Goal 3 (Week 1): Pt will propel wheelchair 64f with modA PT Short Term Goal 3 - Progress (Week 1): Not met Week 2:  PT Short Term Goal 1 (Week 2): STG = LTG due to ELOS  Skilled Therapeutic Interventions/Progress Updates:     Pt sitting in recliner to start - agreeable to PT tx and denies pain. She was able to recall a few of the activities that she completed in prior OT session, with limited descriptors.   squat<>pivot transfer to w/c, towards her L side, requiring modA for facilitation, pt noncompliant with NWB restrictions to her RLE despite max cues and assist for offloading.   Instructed in w/c propulsion, using BUE - able to propel herself 974fwith supervision (!) with min cues for technique and increasing shoulder extension bilaterally.  Completed car transfer with car height simulating a typical sedan - she required modA for this as well and continued to struggle with NWB compliance on her RLE, especially while transferring to her R side.  Instructed in seated there-ex at edge of mat table: -2x8 shoulder press with 1lb dowel rod -3x5 modified sit ups with 1.1lb med ball  Instructed in supine there-ex at mat table: -1x12 heel slides (TC for initiation on RLE) -1x12 SAQ using bolster under knees  She required minA for trunk support for coming from supine to sitting on mat table. Completed lateral scoot transfer to her L side with modA with PT holding her RLE in the air to prevent weight bearing. Returned to her room in w/c with totalA due to fatigue and pt requesting to sit in the recliner. Squat<>pivot transfer with modA and similar technique to recliner from w/c. BLE elevated with safety belt alarm on, call bell in reach, all needs met.   Therapy Documentation Precautions:  Precautions Precautions:  Fall Restrictions Weight Bearing Restrictions: Yes RLE Weight Bearing: Non weight bearing General:    Therapy/Group: Individual Therapy  Alger Simons 02/13/2022,  7:32 AM

## 2022-02-14 LAB — BASIC METABOLIC PANEL
Anion gap: 8 (ref 5–15)
BUN: 46 mg/dL — ABNORMAL HIGH (ref 8–23)
CO2: 27 mmol/L (ref 22–32)
Calcium: 8.7 mg/dL — ABNORMAL LOW (ref 8.9–10.3)
Chloride: 104 mmol/L (ref 98–111)
Creatinine, Ser: 2.48 mg/dL — ABNORMAL HIGH (ref 0.44–1.00)
GFR, Estimated: 21 mL/min — ABNORMAL LOW (ref >60)
Glucose, Bld: 50 mg/dL — ABNORMAL LOW (ref 70–99)
Potassium: 4.2 mmol/L (ref 3.5–5.1)
Sodium: 139 mmol/L (ref 135–145)

## 2022-02-14 LAB — CBC
HCT: 35.8 % — ABNORMAL LOW (ref 36.0–46.0)
Hemoglobin: 11.7 g/dL — ABNORMAL LOW (ref 12.0–15.0)
MCH: 28.1 pg (ref 26.0–34.0)
MCHC: 32.7 g/dL (ref 30.0–36.0)
MCV: 86.1 fL (ref 80.0–100.0)
Platelets: 284 10*3/uL (ref 150–400)
RBC: 4.16 MIL/uL (ref 3.87–5.11)
RDW: 14 % (ref 11.5–15.5)
WBC: 6.3 10*3/uL (ref 4.0–10.5)
nRBC: 0 % (ref 0.0–0.2)

## 2022-02-14 LAB — GLUCOSE, CAPILLARY
Glucose-Capillary: 38 mg/dL — CL (ref 70–99)
Glucose-Capillary: 101 mg/dL — ABNORMAL HIGH (ref 70–99)
Glucose-Capillary: 123 mg/dL — ABNORMAL HIGH (ref 70–99)
Glucose-Capillary: 350 mg/dL — ABNORMAL HIGH (ref 70–99)
Glucose-Capillary: 396 mg/dL — ABNORMAL HIGH (ref 70–99)

## 2022-02-14 MED ORDER — PREGABALIN 75 MG PO CAPS: 75.0000 mg | ORAL_CAPSULE | Freq: Every day | ORAL | Status: DC

## 2022-02-14 MED ORDER — DOCUSATE SODIUM 100 MG PO CAPS
100.0000 mg | ORAL_CAPSULE | Freq: Three times a day (TID) | ORAL | Status: DC
Start: 2022-02-14 — End: 2022-02-18
  Administered 2022-02-14 – 2022-02-18 (×11): 100 mg via ORAL
  Filled 2022-02-14 (×12): qty 1

## 2022-02-14 MED ORDER — GLUCAGON HCL RDNA (DIAGNOSTIC) 1 MG IJ SOLR
INTRAMUSCULAR | Status: AC
  Filled 2022-02-14: qty 1

## 2022-02-14 NOTE — Progress Notes (Signed)
Occupational Therapy Session Note  Patient Details  Name: [name] Gilbert MRN: [id number] Date of Birth: 05/04/1961  Today's Date: 02/14/2022 OT Individual Time: 1030-1100 OT Individual Time Calculation (min): 30 min    Short Term Goals: Week 2:  OT Short Term Goal 1 (Week 2): STGs=LTGs due to ELOS  Skilled Therapeutic Interventions/Progress Updates:    Subjective: Pt states she would like to wash up at the sink. No c/o pain.   Objective:  Pt sitting up in recliner.  Modified stand pivot to w/c with mod assist with care to support RLE to avoid weightbearing per precautions.  Pt propelled self to sink and doffed shirt with supervision needing encouragement due to asking this therapist to complete for her.  Re-educated pt on benefits of attempting independently and pt reluctantly agreed.  Pt bathed UB and brushed teeth with supervision needing intermittent multimodal cues to implement self hand over hand to reduce ataxic movements and promote increased functional use of right dominant hand.  Donned shirt with setup.  Pt agreeable to stay up in w/c for lunch, call bell in reach, seat alarm on.     Assessment:  Pt able to complete UB self care with setup to supervision when implementing self hand over hand stabilization to dominant RUE.   Plan: Pt would benefit from further training on sliding board transfers due to limited progress with squat pivot transfers.   Therapy Documentation Precautions:  Precautions Precautions: Fall Restrictions Weight Bearing Restrictions: Yes RLE Weight Bearing: Non weight bearing    Therapy/Group: Individual Therapy  Amie Critchley 02/14/2022, 12:21 PM

## 2022-02-14 NOTE — Progress Notes (Signed)
PROGRESS NOTE   Subjective/Complaints: Hypoglycemic this AM. Discontinued 0-6U Novolog scale. Discussed wit patient Working with Darrick Meigs on PT now Denies right ankle pain  ROS:  Pt denies SOB, abd pain, CP, N/V/C/D, and vision changes,no headache, right ankle pain   Objective:   No results found. Recent Labs    02/14/22 0504  WBC 6.3  HGB 11.7*  HCT 35.8*  PLT 284    Recent Labs    02/14/22 0504  NA 139  K 4.2  CL 104  CO2 27  GLUCOSE 50*  BUN 46*  CREATININE 2.48*  CALCIUM 8.7*     Intake/Output Summary (Last 24 hours) at 02/14/2022 1023 Last data filed at 02/13/2022 1900 Gross per 24 hour  Intake 297 ml  Output --  Net 297 ml        Physical Exam: Vital Signs Blood pressure 126/71, pulse 73, temperature 98 F (36.7 C), resp. rate 18, height 5' 2.01" (1.575 m), weight 74.2 kg, SpO2 100 %.  General: No acute distress, BMI 29.91 Mood and affect are appropriate Heart: Regular rate and rhythm no rubs murmurs or extra sounds Lungs: Clear to auscultation, no rales or wheezes, normal effort Abdomen: Positive bowel sounds, soft nontender to palpation, nondistended Extremities: No clubbing, cyanosis, or edema Skin: No evidence of breakdown, no evidence of rash   Musculoskeletal:        General: Alert, in bed    Cervical back: Neck supple. No tenderness.     Comments: RUE 3-/5 in biceps, triceps, grip and 1/5 in FA LUE- Biceps 2/5; triceps 3-/5; grip 3-/5 and FA 3-/5 RLE- HF 2-/5; KE 1/5; cannot wiggle toes below cast with ACE wrap LLE- HF 2-/5; KE 2-/5; DF 0/5 and PF 2+/5    However spontaneous movement in R side and L side better than tested when reached for remote to use it  Skin:    Comments: RLE cast from toes to just below knee with half cast; half ACE wrap No skin breakdown seen  Neurological:     Comments: Patient is alert.  Disoriented to time. No acute distress.  She does display mild  to moderate expressive receptive aphasia. Moderate aphasia noted Decreased to light touch per pt on B/L sides- no difference between both sides  No reading comprehension Poor initiation Max cues for recall Psychiatric:     Comments: So extremely flat affect and facies    Assessment/Plan: 1. Functional deficits which require 3+ hours per day of interdisciplinary therapy in a comprehensive inpatient rehab setting. Physiatrist is providing close team supervision and 24 hour management of active medical problems listed below. Physiatrist and rehab team continue to assess barriers to discharge/monitor patient progress toward functional and medical goals  Care Tool:  Bathing    Body parts bathed by patient: Right arm, Chest, Abdomen, Left arm, Front perineal area, Right upper leg, Left upper leg, Left lower leg, Face   Body parts bathed by helper: Right lower leg, Buttocks     Bathing assist Assist Level: Maximal Assistance - Patient 24 - 49%     Upper Body Dressing/Undressing Upper body dressing   What is the patient wearing?: Pull over shirt  Upper body assist Assist Level: Moderate Assistance - Patient 50 - 74%    Lower Body Dressing/Undressing Lower body dressing      What is the patient wearing?: Pants     Lower body assist Assist for lower body dressing: Maximal Assistance - Patient 25 - 49%     Toileting Toileting    Toileting assist Assist for toileting: Dependent - Patient 0%     Transfers Chair/bed transfer  Transfers assist     Chair/bed transfer assist level: Minimal Assistance - Patient > 75% (// bars)     Locomotion Ambulation   Ambulation assist   Ambulation activity did not occur: Safety/medical concerns          Walk 10 feet activity   Assist  Walk 10 feet activity did not occur: Safety/medical concerns        Walk 50 feet activity   Assist Walk 50 feet with 2 turns activity did not occur: Safety/medical concerns          Walk 150 feet activity   Assist Walk 150 feet activity did not occur: Safety/medical concerns         Walk 10 feet on uneven surface  activity   Assist Walk 10 feet on uneven surfaces activity did not occur: Safety/medical concerns         Wheelchair     Assist Is the patient using a wheelchair?: Yes Type of Wheelchair: Manual    Wheelchair assist level: Dependent - Patient 0%      Wheelchair 50 feet with 2 turns activity    Assist        Assist Level: Dependent - Patient 0%   Wheelchair 150 feet activity     Assist      Assist Level: Dependent - Patient 0%   Blood pressure 126/71, pulse 73, temperature 98 F (36.7 C), resp. rate 18, height 5' 2.01" (1.575 m), weight 74.2 kg, SpO2 100 %.    Medical Problem List and Plan: 1. Functional deficits secondary to large acute to subacute infarct posterior medial left temporal and occipital lobe and left thalamus in the setting of multiple intracranial stenosis as well as history of CVA with left-sided weakness/aphasia             -patient may  shower- bag RLE             -ELOS/Goals: 14-16 days- min A to supervision  Continue CIR- PT, OT and SLP  -She was able to don doff shirt and bathe UB at sink with supervision 2.  Impaired mobility and ADLs: goal of bed mobility with Mod A this week. Continue Lovenox.              -antiplatelet therapy: Aspirin 81 mg daily and Plavix 75 mg daily x90 days then aspirin alone 3. Pain: Decrease Lyrica 75 mg to HS, discontinue hydrocodone. 4. Mood: Provide emotional support             -antipsychotic agents: N/A 5. Neuropsych: This patient is?capable of making decisions on her own behalf. 6. Skin/Wound Care: Routine skin checks 7. Fluids/Electrolytes/Nutrition: Routine in and outs with follow-up chemistries 8.  Right distal fibular metaphysis fracture with minimal lateral displacement.  Nonsurgical.  Follow-up orthopedic services Dr.Poggi.  Nonweightbearing right  lower extremity Pt not using pain meds, no falls, occ puts a little weight on RLE as per OT but using sling board for transfers , discussed with RN , may need to offer pt tylenol before tharapy  9.  Diabetes mellitus.  Hemoglobin A1c 11.1. Change Semglee back to daily dosing. Check blood sugars before meals and at bedtime. Recommended choosing low added sugar/high protein foods. Decrease Novolog sliding scale for hypoglycemia. Changed diet to carb modified.  10.  Seizure prophylaxis.  d/c Keppra 250 mg as has completed 16 day course 11.  Permissive hypertension.  Presently on HCTZ 25 mg daily.  Patient on amlodipine 10 mg daily, hydralazine 25 mg 3 times daily, Coreg 25 mg twice daily, Aldactone 150 mg daily PTA.  Vitals:   02/13/22 1916 02/14/22 0422  BP: (!) 151/74 126/71  Pulse: 72 73  Resp: 17 18  Temp: 98.1 F (36.7 C) 98 F (36.7 C)  SpO2: 100% 100%  Well controlled 5/30 12.  Diastolic congestive heart failure.  Monitor for any signs of fluid overload  5/27- will check daily weights- will order 13.  AKI on CKD stage III.  Creatinine baseline 1.4-1.7.  Follow-up chemistries - Recheck labs tomorrow 14.  Hyperlipidemia.  Crestor 15. Constipation- add colace daily.  -6/5 sorbitol ordered x1, prn dulcolax 16. Obesity: BMI 30.72-->29.87-->29.91: provided list of foods to help with weight loss. 17. Low protein: discussed choosing high protein and low added sugar foods 18. Poor initiation: continue amantadine 100mg  daily 19. Dysphagia: continue D3 trials    LOS: 12 days A FACE TO FACE EVALUATION WAS PERFORMED  [name] Gilbert 02/14/2022, 10:23 AM

## 2022-02-14 NOTE — Progress Notes (Signed)
Patient ID: [name] Gilbert, female   DOB: 05/19/1961, 61 y.o.   MRN: [id number]  Spoke with husband who is concerned pt will not do anything once gets home since this is the pattern she does. He wonders reason for him to come in Thursday for education. Told him for [place] to show him how to get her into and out of a car and show how she transfers. Discussed she can not go to a NH from here due to her managed medicare insurance. He reports she does the same thing there and once home does what she wants to do. Discussed we can not change her personality and her pattern is to do what she wants to do. Informed husband it is up to him if he wants to come in and do education on Thursday but the team does recommend this. He has health issues of his own and he feels they are getting worse with worrying about pt. Will continue to work on best plan for both and talk with pt about her need to help her husband with what she can do for herself.

## 2022-02-14 NOTE — Progress Notes (Signed)
Physical Therapy Session Note  Patient Details  Name: [name] Gilbert MRN: [id number] Date of Birth: Sep 29, 1960  Today's Date: 02/14/2022 PT Individual Time: 0830-0956 PT Individual Time Calculation (min): 86 min   Short Term Goals: Week 2:  PT Short Term Goal 1 (Week 2): STG = LTG due to ELOS  Skilled Therapeutic Interventions/Progress Updates:      Pt received supine in bed, agreeable to PT tx. Reports no pain. Pt requiring assist to get dressed. Supine<>sitting EOB with supervision with HOB slightly elevated. Completed UB dressing (doffing and donning shirt) with setupA. She required modA for LB dressing and this was difficult to pull over hips in standing due to NWB status on RLE. Completed squat<>pivot transfer with modA from EOB to w/c, towards her stronger L side - PT holding RLE in air to prevent weight bearing. Instructed in w/c mobility where she propelled herself 3x31ft with supervision, using BUE to propel. Limited R>L shoulder extension during propulsion and often drifting to the R. Impaired proprioception in R hand also impacting wheelchair mobility.  Then focused session on completing custom wheelchair evaluation with ATP Brandon from [employer]. Collaborated with OT as well to best determine pt needs. Pt will be receiving a K0005 wheelchair with hand spokes, extending brake levers, 2deg camber solid wheels.   Pt then setup on UE ergometer while seated in w/c - completed + (rest break) with resistance set to 1 - cues required for monitoring R hand grip due to proprioception deficits.   Worked on blocked practice squat<>pivot transfers from mat table to w/c - requiring minA overall with mod cues for sequencing and setup. Pt having difficulty comply with NWB to her RLE and required PT to hold RLE in air to prevent from weight bearing. Pt deferring use of SB for transfer method.   Instructed in seated there-ex for forced use of proprioception training with RUE and to promote  forward weight shifting to carryover into functional transfers. Unilateral unweighted vs weighted (2.2lb) arm punches to punching bag, 2x10 each. Pt had difficulty grasping small weighted ball with multiple drops during activity. Inattention, sensory, proprioception deficits impacting.  Pt returned to her room in w/c and completed modA squat<>pivot from w/c to recliner. Remained seated in recliner with safety belt alarm on, BLE elevated, call bell in reach.   Therapy Documentation Precautions:  Precautions Precautions: Fall Restrictions Weight Bearing Restrictions: Yes RLE Weight Bearing: Non weight bearing General:     Therapy/Group: Individual Therapy  Zaedyn Covin P Larkin Alfred PT 02/14/2022, 7:34 AM

## 2022-02-14 NOTE — Progress Notes (Signed)
Orthopedic Tech Progress Note Patient Details:  [name] Gilbert 12-18-1960 [id number]  RN called requesting per patient to have "soft cast reapply", yet I see no order or anything in writing saying patient has on "soft cast", told RN/LPN to get an order and ill do whatever   Patient ID: [name], female   DOB: 1961/01/08, 61 y.o.   MRN: [id number]  [name] 02/14/2022, 4:03 PM

## 2022-02-14 NOTE — Progress Notes (Signed)
Speech Language Pathology Daily Session Note  Patient Details  Name: [name] Gilbert MRN: [id number] Date of Birth: 1961/06/02  Today's Date: 02/14/2022 SLP Individual Time: 1431-1515 SLP Individual Time Calculation (min): 44 min  Short Term Goals: Week 2: SLP Short Term Goal 1 (Week 2): STG=LTG due to EOS (6/10)  Skilled Therapeutic Interventions: Pt seen for skilled ST with focus on cognitive and language goals, pt in bed and agreeable to therapeutic tasks, husband present throughout. Pt reports today is a "so-so" day in terms of word finding and communication, reports frustration while pointing to head and saying "the words are here but they won't come out. I need extra time". SLP encouraging patient and husband to allow patient extra time to communicate her message at home vs finishing her thoughts or answering for her. SLP facilitating simple divergent naming task by providing max A multimodal cues. Pt able to name common pictures with mod A cues for 80% accuracy, able to verbalizing 1 similarity and 1 difference between 2 pictures with max A cues for 40% accuracy. Pt is oriented to d/c date "Saturday", benefits from min cues to increase orientation to current DOW, MOY and year. Pt fatiguing toward end of session and states "I want school to be over now". Pt left in bed with alarm set and husband present for needs. Cont ST POC.   Pain Pain Assessment Pain Scale: 0-10 Pain Score: 0-No pain  Therapy/Group: Individual Therapy  Tacey Ruiz 02/14/2022, 3:11 PM

## 2022-02-14 NOTE — Progress Notes (Signed)
Hypoglycemic Event  CBG:38  Treatment: Glucagon IM 1 mg. Patient refused juice or sprite.  Symptoms: None  Follow-up CBG: Time:05:41 CBG Result:101  Possible Reasons for Event: Inadequate meal intake  Comments/MD notified:yes    [name] Gilbert

## 2022-02-14 NOTE — Progress Notes (Signed)
Occupational Therapy Session Note  Patient Details  Name: [name] Gilbert MRN: [id number] Date of Birth: 1961-06-30  Today's Date: 02/14/2022 OT Individual Time: 8338-2505 OT Individual Time Calculation (min): 43 min    Short Term Goals: Week 2:  OT Short Term Goal 1 (Week 2): STGs=LTGs due to ELOS  Skilled Therapeutic Interventions/Progress Updates:  Pt greeted supine in bed reporting fatigue and declining OOB mobility. pt      agreeable to bed level OT intervention. Session focus on RUE FMC/ motor planning and wrist AROM/ strengthening.  Pt completed below thereautic activities with an emphasis on wrist supination/pronation, keeping wrist in neutral position, L attention, visual scanning, proprioception,and RUE motor planning.   - using spatula to faciliate wrist pronation/supination with pt instrucetd o flip over dsics simulating IADL ask of flipping pancakes.  -  engaging in matching game with blocks with emphais on wrist supination/pronation and working memory; pt noted to have difficulty remembering location of blocks and required MOD cues to remember to use R hand vs L hand. - stacking blocks with RUE to facilitate improved wrist control and FMC - passing discs from R hand to L hand to promote improved wrist supination/pronation  Pt left supine in bed with bed alarm activated and all needs within reach.          Therapy Documentation Precautions:  Precautions Precautions: Fall Restrictions Weight Bearing Restrictions: Yes RLE Weight Bearing: Non weight bearing  Pain: No pain reported during session      Therapy/Group: Individual Therapy  Barron Schmid 02/14/2022, 3:26 PM

## 2022-02-14 NOTE — Progress Notes (Signed)
Pt's spouse stated that pt has decreased sensation to right foot. Foot has a soft cast on. Spouse stated that normally pt does not like for feet to be touched, today she is not bothered by it. Pt verbalized that foot "feels dead." Pt is able to move toes, warm to touch, cap refil WNL, and pulse is +2. Dan PA notified. Ortho made aware. Ortho here to redo cast.

## 2022-02-14 NOTE — Progress Notes (Signed)
Orthopedic Tech Progress Note Patient Details:  [name] Gilbert 09-10-1961 [id number]  Patient had on 5 ace wraps to leg, once removed ace patient stated " she wanted me to remove her toes, because they would be in her way when she started walking" went a notified RN.  Patient ID: [name] Gilbert, female   DOB: February 27, 1961, 61 y.o.   MRN: [id number]  [name] 02/14/2022, 4:42 PM

## 2022-02-14 NOTE — Discharge Summary (Signed)
Physician Discharge Summary  Patient ID: [name] Gilbert MRN: [id number] DOB/AGE: 1961/04/17 61 y.o.  Admit date: 02/02/2022 Discharge date: 02/18/2022  Discharge Diagnoses:  Principal Problem:   Left thalamic infarction [facility]) DVT prophylaxis Pain management Right distal fibular metaphysis fracture with minimal lateral displacement Diabetes mellitus Seizure prophylaxis Permissive hypertension Diastolic congestive heart failure AKI on CKD stage III Hyperlipidemia Constipation Obesity Decreased nutritional storage  Discharged Condition: Stable  Significant Diagnostic Studies: DG Ankle Complete Right  Result Date: 02/16/2022 CLINICAL DATA:  Ankle fracture EXAM: RIGHT ANKLE - COMPLETE 3+ VIEW COMPARISON:  01/26/2022 FINDINGS: Cast material obscures fine bone detail. Bones are osteopenic. Similar appearance of the oblique acute minimally displaced fracture of the right distal fibula. No significant callus formation at this short interval. Visualized tibia, talus and calcaneus appear intact. No joint abnormality. IMPRESSION: Stable acute oblique minimally displaced right distal fibular fracture. Electronically Signed   By: Jerilynn Mages.  Shick M.D.   On: 02/16/2022 16:29   CT HEAD WO CONTRAST (5MM)  Result Date: 01/27/2022 CLINICAL DATA:  Altered mental status, nontraumatic (Ped 0-17y); sudden onset right-sided weakness EXAM: CT HEAD WITHOUT CONTRAST TECHNIQUE: Contiguous axial images were obtained from the base of the skull through the vertex without intravenous contrast. RADIATION DOSE REDUCTION: This exam was performed according to the departmental dose-optimization program which includes automated exposure control, adjustment of the mA and/or kV according to patient size and/or use of iterative reconstruction technique. COMPARISON:  01/25/2022 FINDINGS: Brain: Evolving recent infarction of the medial left temporal lobe extending into the occipital lobe. Patchy density in the left thalamus likely  corresponds to acute infarction seen on the MRI. Similar regional mass effect. No hemorrhage. Unchanged chronic infarcts and chronic microvascular ischemic changes. Ventricles and sulci are stable in size and configuration. No extra-axial collection. Vascular: There is atherosclerotic calcification at the skull base. Skull: Calvarium is unremarkable. Sinuses/Orbits: No acute finding. Other: None. IMPRESSION: Evolving recent infarcts of the left temporal and occipital lobes and left thalamus with mild mass effect and no hemorrhage. Electronically Signed   By: Macy Mis M.D.   On: 01/27/2022 14:37   DG Ankle Complete Right  Result Date: 01/26/2022 CLINICAL DATA:  Right ankle pain after fall 2 days ago EXAM: RIGHT ANKLE - COMPLETE 3+ VIEW COMPARISON:  None Available. FINDINGS: Acute obliquely oriented fracture of the distal fibular metaphysis with minimal lateral displacement. No fracture identified of the medial or posterior malleoli. Ankle mortise is congruent. No dislocation. Soft tissue swelling about the ankle. IMPRESSION: Acute obliquely oriented fracture of the distal fibular metaphysis with minimal lateral displacement. Electronically Signed   By: Davina Poke D.O.   On: 01/26/2022 14:02   ECHOCARDIOGRAM COMPLETE  Result Date: 01/25/2022    ECHOCARDIOGRAM REPORT   Patient Name:   [name] Date of Exam: 01/25/2022 Medical Rec #:  [id number]        Height:       62.0 in Accession #:    [id number]       Weight:       180.8 lb Date of Birth:  [birth year]        BSA:          1.831 m Patient Age:    3 years         BP:           158/85 mmHg Patient Gender: F                HR:  96 bpm. Exam Location:  [facility] Procedure: 2D Echo, Cardiac Doppler and Color Doppler Indications:     Stroke I63.9  History:         Patient has prior history of Echocardiogram examinations, most                  recent 06/13/2021. Stroke; Risk Factors:Hypertension and                  Diabetes.  Sonographer:      Sherrie Sport Referring Phys:  [id number] Soledad Gerlach NIU Diagnosing Phys: Yolonda Kida MD  Sonographer Comments: Suboptimal apical window and no subcostal window. IMPRESSIONS  1. Left ventricular ejection fraction, by estimation, is 60 to 65%. The left ventricle has normal function. The left ventricle has no regional wall motion abnormalities. Left ventricular diastolic parameters are consistent with Grade I diastolic dysfunction (impaired relaxation).  2. Right ventricular systolic function is normal. The right ventricular size is normal.  3. The mitral valve is normal in structure. Trivial mitral valve regurgitation.  4. The aortic valve is normal in structure. Aortic valve regurgitation is not visualized. FINDINGS  Left Ventricle: Left ventricular ejection fraction, by estimation, is 60 to 65%. The left ventricle has normal function. The left ventricle has no regional wall motion abnormalities. The left ventricular internal cavity size was normal in size. There is  borderline left ventricular hypertrophy. Left ventricular diastolic parameters are consistent with Grade I diastolic dysfunction (impaired relaxation). Right Ventricle: The right ventricular size is normal. No increase in right ventricular wall thickness. Right ventricular systolic function is normal. Left Atrium: Left atrial size was normal in size. Right Atrium: Right atrial size was normal in size. Pericardium: There is no evidence of pericardial effusion. Mitral Valve: The mitral valve is normal in structure. Trivial mitral valve regurgitation. MV peak gradient, 4.7 mmHg. The mean mitral valve gradient is 2.0 mmHg. Tricuspid Valve: The tricuspid valve is normal in structure. Tricuspid valve regurgitation is trivial. Aortic Valve: The aortic valve is normal in structure. Aortic valve regurgitation is not visualized. Aortic valve mean gradient measures 3.0 mmHg. Aortic valve peak gradient measures 5.0 mmHg. Aortic valve area, by VTI measures 2.60 cm.  Pulmonic Valve: The pulmonic valve was normal in structure. Pulmonic valve regurgitation is not visualized. Aorta: The ascending aorta was not well visualized. IAS/Shunts: No atrial level shunt detected by color flow Doppler.  LEFT VENTRICLE PLAX 2D LVIDd:         3.30 cm   Diastology LVIDs:         2.10 cm   LV e' medial:    4.57 cm/s LV PW:         1.20 cm   LV E/e' medial:  11.1 LV IVS:        0.90 cm   LV e' lateral:   6.31 cm/s LVOT diam:     2.00 cm   LV E/e' lateral: 8.0 LV SV:         48 LV SV Index:   26 LVOT Area:     3.14 cm  RIGHT VENTRICLE RV Basal diam:  3.10 cm RV S prime:     13.60 cm/s TAPSE (M-mode): 2.0 cm LEFT ATRIUM             Index        RIGHT ATRIUM           Index LA diam:        2.70 cm 1.47 cm/m  RA Area:     10.70 cm LA Vol (A2C):   31.3 ml 17.09 ml/m  RA Volume:   23.80 ml  13.00 ml/m LA Vol (A4C):   21.7 ml 11.85 ml/m LA Biplane Vol: 26.3 ml 14.36 ml/m  AORTIC VALVE                    PULMONIC VALVE AV Area (Vmax):    2.56 cm     RVOT Peak grad: 8 mmHg AV Area (Vmean):   2.56 cm AV Area (VTI):     2.60 cm AV Vmax:           112.00 cm/s AV Vmean:          84.200 cm/s AV VTI:            0.186 m AV Peak Grad:      5.0 mmHg AV Mean Grad:      3.0 mmHg LVOT Vmax:         91.40 cm/s LVOT Vmean:        68.700 cm/s LVOT VTI:          0.154 m LVOT/AV VTI ratio: 0.83  AORTA Ao Root diam: 2.40 cm MITRAL VALVE                TRICUSPID VALVE MV Area (PHT): 3.91 cm     TR Peak grad:   11.3 mmHg MV Area VTI:   3.04 cm     TR Vmax:        168.00 cm/s MV Peak grad:  4.7 mmHg MV Mean grad:  2.0 mmHg     SHUNTS MV Vmax:       1.08 m/s     Systemic VTI:  0.15 m MV Vmean:      59.3 cm/s    Systemic Diam: 2.00 cm MV Decel Time: 194 msec     Pulmonic VTI:  0.236 m MV E velocity: 50.60 cm/s MV A velocity: 104.00 cm/s MV E/A ratio:  0.49 Dwayne D Callwood MD Electronically signed by Yolonda Kida MD Signature Date/Time: 01/25/2022/8:28:00 PM    Final    MR BRAIN WO CONTRAST  Result Date:  01/25/2022 CLINICAL DATA:  Neuro deficit, acute, stroke suspected; Vertebral artery aneurysm suspected EXAM: MRI HEAD WITHOUT CONTRAST MRA HEAD WITHOUT CONTRAST MRA OF THE NECK WITHOUT AND WITH CONTRAST TECHNIQUE: Multiplanar, multi-echo pulse sequences of the brain and surrounding structures were acquired without intravenous contrast. Angiographic images of the Circle of Willis were acquired using MRA technique without intravenous contrast. Angiographic images of the neck were acquired using MRA technique without and with intravenous contrast. Carotid stenosis measurements (when applicable) are obtained utilizing NASCET criteria, using the distal internal carotid diameter as the denominator. CONTRAST:  7.22mL GADAVIST GADOBUTROL 1 MMOL/ML IV SOLN COMPARISON:  CT head Jan 25, 2022. MRA FINDINGS: MR HEAD FINDINGS Brain: Confluent acute infarct in the medial left temporal lobe with additional punctate infarct in the left thalamus. Associated edema without significant mass effect. Additional patchy T2/FLAIR hyperintensities in the white matter and pons, nonspecific but both the microvascular disease. Remote infarcts in bilateral parieto-occipital lobes. Vascular: Detailed below. Skull and upper cervical spine: Normal marrow signal. Sinuses/Orbits: Clear sinuses.  No acute orbital findings. Other: No mastoid effusions. MRA HEAD FINDINGS Anterior circulation: Bilateral intracranial ICAs, MCAs, and ACAs are patent. Similar severe distal M1/proximal M2 MCA stenosis bilaterally. Similar absent left A1 ACA with prominent right A1 ACA, probably congenital/anatomic variant. Similar mild-to-moderate right A1  ACA stenosis. Posterior circulation: Bilateral intradural vertebral arteries, basilar artery and right posterior cerebral artery are patent without significant proximal stenosis. Left P1 PCA is a occluded with absence of flow related signal distally. MRA NECK FINDINGS Motion limited study.  Within this limitation: Aortic  arch: Great vessel origins are patent. Right carotid system: Patent without evidence of significant (greater than 50%) stenosis. Left carotid system: Patent without evidence of significant (greater than 50%) stenosis. Vertebral arteries: Patent bilaterally without evidence of significant (greater than 50%) stenosis. IMPRESSION: MRI: 1. Confluent acute infarct in the medial left temporal lobe with additional punctate infarct in the left thalamus. Associated edema without significant mass effect. 2. Remote infarcts and moderate chronic microvascular disease. MRA Head: 1. Left P1 PCA occlusion, previously severely stenotic. 2. Similar severe distal M1/proximal M2 MCA stenosis bilaterally. Similar mild to moderate right A1 ACA stenosis. MRA Neck: No evidence of significant (greater than 50%) stenosis. Findings were communicated on 01/25/2022 at 11:01 am to provider Blaine Hamper via telephone, who verbally acknowledged these results. Electronically Signed   By: Margaretha Sheffield M.D.   On: 01/25/2022 11:04   MR ANGIO HEAD WO CONTRAST  Result Date: 01/25/2022 CLINICAL DATA:  Neuro deficit, acute, stroke suspected; Vertebral artery aneurysm suspected EXAM: MRI HEAD WITHOUT CONTRAST MRA HEAD WITHOUT CONTRAST MRA OF THE NECK WITHOUT AND WITH CONTRAST TECHNIQUE: Multiplanar, multi-echo pulse sequences of the brain and surrounding structures were acquired without intravenous contrast. Angiographic images of the Circle of Willis were acquired using MRA technique without intravenous contrast. Angiographic images of the neck were acquired using MRA technique without and with intravenous contrast. Carotid stenosis measurements (when applicable) are obtained utilizing NASCET criteria, using the distal internal carotid diameter as the denominator. CONTRAST:  7.27mL GADAVIST GADOBUTROL 1 MMOL/ML IV SOLN COMPARISON:  CT head Jan 25, 2022. MRA FINDINGS: MR HEAD FINDINGS Brain: Confluent acute infarct in the medial left temporal lobe with  additional punctate infarct in the left thalamus. Associated edema without significant mass effect. Additional patchy T2/FLAIR hyperintensities in the white matter and pons, nonspecific but both the microvascular disease. Remote infarcts in bilateral parieto-occipital lobes. Vascular: Detailed below. Skull and upper cervical spine: Normal marrow signal. Sinuses/Orbits: Clear sinuses.  No acute orbital findings. Other: No mastoid effusions. MRA HEAD FINDINGS Anterior circulation: Bilateral intracranial ICAs, MCAs, and ACAs are patent. Similar severe distal M1/proximal M2 MCA stenosis bilaterally. Similar absent left A1 ACA with prominent right A1 ACA, probably congenital/anatomic variant. Similar mild-to-moderate right A1 ACA stenosis. Posterior circulation: Bilateral intradural vertebral arteries, basilar artery and right posterior cerebral artery are patent without significant proximal stenosis. Left P1 PCA is a occluded with absence of flow related signal distally. MRA NECK FINDINGS Motion limited study.  Within this limitation: Aortic arch: Great vessel origins are patent. Right carotid system: Patent without evidence of significant (greater than 50%) stenosis. Left carotid system: Patent without evidence of significant (greater than 50%) stenosis. Vertebral arteries: Patent bilaterally without evidence of significant (greater than 50%) stenosis. IMPRESSION: MRI: 1. Confluent acute infarct in the medial left temporal lobe with additional punctate infarct in the left thalamus. Associated edema without significant mass effect. 2. Remote infarcts and moderate chronic microvascular disease. MRA Head: 1. Left P1 PCA occlusion, previously severely stenotic. 2. Similar severe distal M1/proximal M2 MCA stenosis bilaterally. Similar mild to moderate right A1 ACA stenosis. MRA Neck: No evidence of significant (greater than 50%) stenosis. Findings were communicated on 01/25/2022 at 11:01 am to provider Blaine Hamper via telephone, who  verbally acknowledged  these results. Electronically Signed   By: Margaretha Sheffield M.D.   On: 01/25/2022 11:04   MR ANGIO NECK W WO CONTRAST  Result Date: 01/25/2022 CLINICAL DATA:  Neuro deficit, acute, stroke suspected; Vertebral artery aneurysm suspected EXAM: MRI HEAD WITHOUT CONTRAST MRA HEAD WITHOUT CONTRAST MRA OF THE NECK WITHOUT AND WITH CONTRAST TECHNIQUE: Multiplanar, multi-echo pulse sequences of the brain and surrounding structures were acquired without intravenous contrast. Angiographic images of the Circle of Willis were acquired using MRA technique without intravenous contrast. Angiographic images of the neck were acquired using MRA technique without and with intravenous contrast. Carotid stenosis measurements (when applicable) are obtained utilizing NASCET criteria, using the distal internal carotid diameter as the denominator. CONTRAST:  7.66mL GADAVIST GADOBUTROL 1 MMOL/ML IV SOLN COMPARISON:  CT head Jan 25, 2022. MRA FINDINGS: MR HEAD FINDINGS Brain: Confluent acute infarct in the medial left temporal lobe with additional punctate infarct in the left thalamus. Associated edema without significant mass effect. Additional patchy T2/FLAIR hyperintensities in the white matter and pons, nonspecific but both the microvascular disease. Remote infarcts in bilateral parieto-occipital lobes. Vascular: Detailed below. Skull and upper cervical spine: Normal marrow signal. Sinuses/Orbits: Clear sinuses.  No acute orbital findings. Other: No mastoid effusions. MRA HEAD FINDINGS Anterior circulation: Bilateral intracranial ICAs, MCAs, and ACAs are patent. Similar severe distal M1/proximal M2 MCA stenosis bilaterally. Similar absent left A1 ACA with prominent right A1 ACA, probably congenital/anatomic variant. Similar mild-to-moderate right A1 ACA stenosis. Posterior circulation: Bilateral intradural vertebral arteries, basilar artery and right posterior cerebral artery are patent without significant  proximal stenosis. Left P1 PCA is a occluded with absence of flow related signal distally. MRA NECK FINDINGS Motion limited study.  Within this limitation: Aortic arch: Great vessel origins are patent. Right carotid system: Patent without evidence of significant (greater than 50%) stenosis. Left carotid system: Patent without evidence of significant (greater than 50%) stenosis. Vertebral arteries: Patent bilaterally without evidence of significant (greater than 50%) stenosis. IMPRESSION: MRI: 1. Confluent acute infarct in the medial left temporal lobe with additional punctate infarct in the left thalamus. Associated edema without significant mass effect. 2. Remote infarcts and moderate chronic microvascular disease. MRA Head: 1. Left P1 PCA occlusion, previously severely stenotic. 2. Similar severe distal M1/proximal M2 MCA stenosis bilaterally. Similar mild to moderate right A1 ACA stenosis. MRA Neck: No evidence of significant (greater than 50%) stenosis. Findings were communicated on 01/25/2022 at 11:01 am to provider Blaine Hamper via telephone, who verbally acknowledged these results. Electronically Signed   By: Margaretha Sheffield M.D.   On: 01/25/2022 11:04   CT Cervical Spine Wo Contrast  Result Date: 01/25/2022 CLINICAL DATA:  Study identified as missing a report at 9:19 am on 01/25/2022. 61 year old female status post fall with pain. Hypertensive, 0000000 systolic. Hyperglycemia. EXAM: CT CERVICAL SPINE WITHOUT CONTRAST TECHNIQUE: Multidetector CT imaging of the cervical spine was performed without intravenous contrast. Multiplanar CT image reconstructions were also generated. RADIATION DOSE REDUCTION: This exam was performed according to the departmental dose-optimization program which includes automated exposure control, adjustment of the mA and/or kV according to patient size and/or use of iterative reconstruction technique. COMPARISON:  Head CT the same day reported separately. FINDINGS: Alignment: Straightening of  cervical lordosis. Cervicothoracic junction alignment is within normal limits. Bilateral posterior element alignment is within normal limits. Skull base and vertebrae: Visualized skull base is intact. No atlanto-occipital dissociation. C1 and C2 appear intact and aligned. No acute osseous abnormality identified. Soft tissues and spinal canal: No prevertebral fluid  or swelling. No visible canal hematoma. Negative noncontrast visible neck soft tissues, mild carotid calcified atherosclerosis. Disc levels: Mild for age cervical spine degeneration, most pronounced at C5-C6. Upper chest: Negative. IMPRESSION: 1. No acute traumatic injury identified in the cervical spine. 2. Mild for age cervical spine degeneration. Electronically Signed   By: Genevie Ann M.D.   On: 01/25/2022 09:21   DG Hip Unilat W or Wo Pelvis 2-3 Views Left  Result Date: 01/25/2022 CLINICAL DATA:  fall EXAM: DG HIP (WITH OR WITHOUT PELVIS) 2-3V LEFT COMPARISON:  CT 06/12/2021 FINDINGS: There is no evidence of hip fracture or dislocation. Mild bilateral hip DJD. Iliofemoral arterial calcifications. Bilateral pelvic phleboliths. IMPRESSION: Mild bilateral hip DJD.  No acute findings. Electronically Signed   By: Lucrezia Europe M.D.   On: 01/25/2022 06:40   DG Chest Portable 1 View  Result Date: 01/25/2022 CLINICAL DATA:  BIB [facility] from home due to fall X 1 hour ago, new left lower back pain and generalized weakness (for last few days). Baseline left sided deficits from previous stroke. Hypertensive, 204/98. CBG 201 Unable to tell RN what caused fall. EXAM: PORTABLE CHEST - 1 VIEW COMPARISON:  10/02/2019 FINDINGS: Lungs are clear. Heart size and mediastinal contours are within normal limits. No effusion.  No pneumothorax. Visualized bones unremarkable. IMPRESSION: No acute cardiopulmonary disease. Electronically Signed   By: Lucrezia Europe M.D.   On: 01/25/2022 06:38   CT Head Wo Contrast  Result Date: 01/25/2022 CLINICAL DATA:  61 year old female status  post fall with pain. Hypertensive, 0000000 systolic. Hyperglycemia. EXAM: CT HEAD WITHOUT CONTRAST TECHNIQUE: Contiguous axial images were obtained from the base of the skull through the vertex without intravenous contrast. RADIATION DOSE REDUCTION: This exam was performed according to the departmental dose-optimization program which includes automated exposure control, adjustment of the mA and/or kV according to patient size and/or use of iterative reconstruction technique. COMPARISON:  Head CT 11/01/2021 and earlier. FINDINGS: Brain: Advanced chronic ischemic disease. Multifocal cerebral encephalomalacia, maximal in the left medial parietal and occipital lobes. Fairly extensive superimposed chronic small vessel disease in the deep gray nuclei and brainstem. However, new broad-based area of cytotoxic edema in the left posterior and mesial temporal lobe (coronal image 39 versus series 4, image 40 in February). This could be acute or subacute. No hemorrhagic transformation or mass effect. Elsewhere gray-white matter differentiation is stable. No midline shift, mass effect, or evidence of intracranial mass lesion. No ventriculomegaly. Normal basilar cisterns. Vascular: Calcified atherosclerosis at the skull base. No suspicious intracranial vascular hyperdensity. Skull: Stable.  No acute osseous abnormality identified. Sinuses/Orbits: Visualized paranasal sinuses and mastoids are clear. Other: No acute orbit or scalp soft tissue injury. IMPRESSION: 1. Relatively large acute to subacute infarct of the posterior and mesial Left temporal lobe, new since February. 2. No hemorrhagic transformation or significant mass effect. 3. Otherwise stable advanced underlying chronic ischemic disease. 4. No acute traumatic injury identified. Electronically Signed   By: Genevie Ann M.D.   On: 01/25/2022 06:38    Labs:  Basic Metabolic Panel: Recent Labs  Lab 02/14/22 0504 02/16/22 0530  NA 139 137  K 4.2 4.2  CL 104 105  CO2 27 26   GLUCOSE 50* 283*  BUN 46* 47*  CREATININE 2.48* 2.41*  CALCIUM 8.7* 8.7*    CBC: Recent Labs  Lab 02/14/22 0504  WBC 6.3  HGB 11.7*  HCT 35.8*  MCV 86.1  PLT 284    CBG: Recent Labs  Lab 02/15/22 2140 02/16/22  [id number] 02/16/22 1153 02/16/22 1649 02/16/22 2052  GLUCAP 314* 253* 333* 332* 287*   Family history.  Father with diabetes.  Denies any colon cancer esophageal cancer or rectal cancer  Brief HPI:   [name] Gilbert is a 61 y.o. 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  Per chart review lives with spouse modified independent prior to admission.  [facility]ed [facility] with aphasia with increasing weakness and recent fall with complaints of right ankle pain.  Noted blood pressure 204/98.  Cranial CT scan showed relatively large acute to subacute infarct in the posterior and mesial left temporal lobe new since February.  No hemorrhagic transformation or significant mass effect.  CT cervical spine negative.  MRI/MRA confluent acute infarct in the medial left temporal lobe with additional punctate infarct in the left thalamus.  Associated edema without significant mass effect.  Remote infarct and moderate chronic microvascular disease.  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  Similar severe distal M1 proximal M2 MCA stenosis bilaterally.  Patient did not receive tPA.  Admission chemistries unremarkable except sodium 132 glucose 261 BUN 27 creatinine 1.71 troponin 62-50-44, BNP 80.8, urinalysis negative nitrite urine drug screen negative.  Echocardiogram with ejection fraction of 60 to [id number] grade 1 diastolic dysfunction.  Elevated troponin felt to be related to demand ischemia.  Maintained on low-dose aspirin and Plavix for CVA prophylaxis for 90 days.  Lovenox for DVT prophylaxis.  Maintained on Keppra for  seizure prophylaxis.  X-rays of right ankle revealed a minimally displaced right distal fibular fracture and orthopedic services consulted Dr. Milagros Evener with no surgical intervention placed in a posterior splint with sugar-tong supplement and nonweightbearing.  Tolerating mechanical soft diet.  Monitoring of AKI on CKD latest creatinine 2.04.  Episode of hyperkalemia 02/01/2022 treated.  Palliative care consulted to establish goals of care.  Therapy evaluations completed due to patient decreased functional mobility was admitted for a comprehensive rehab program.   Hospital Course: Juan Bernstein was admitted to rehab 02/02/2022 for inpatient therapies to consist of PT, ST and OT at least three hours five days a week. Past admission physiatrist, therapy team and rehab RN have worked together to provide customized collaborative inpatient rehab.  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  Amantadine was added to patient's regimen to help with initiation to attend to tasks and has since been discontinued.  Right distal fibular fracture with minimal lateral displacement nonsurgical follow-up orthopedic services Dr.Poggi.  Nonweightbearing right lower extremity.  Follow-up x-rays 02/16/2022 showed stable acute oblique minimally displaced right distal fibular fracture.  Pain managed with use of Lyrica nightly oxycodone had been discontinued.  Blood sugars monitored hemoglobin A1c 11.1 insulin therapy as directed with full diabetic teaching.  Seizure prophylaxis Keppra course completed no seizure activity.  Permissive hypertension low-dose HCTZ resumed and would need outpatient follow-up on resuming her home  regimen.  Diastolic congestive heart failure exhibited no signs of fluid overload.  Her Aldactone was initially held due to hyperkalemia.  AKI on CKD stage III and latest creatinine 2.41 with outpatient follow-up.  Bouts of constipation resolved with laxative assistance.  Noted obesity BMI 29.91 dietary follow-up.   Blood pressures were monitored on TID basis and maintained  Diabetes has been monitored with ac/hs CBG checks and SSI was use prn for tighter BS control.    Rehab course: During patient's stay in rehab weekly team conferences were held to monitor patient's progress, set goals and discuss barriers to discharge. At admission, patient required minimal assist 2 feet rolling walker max assist sit to supine  Physical exam.  Blood pressure 159/78 pulse 71 temperature 98.3 respiration 17 oxygen saturations 100% room air Constitutional.  No acute distress HEENT Head.  Normocephalic and atraumatic Eyes.  Pupils reactive to light right gaze preference Cardiac regular rate and rhythm without any extra sounds or murmur heard Abdomen.  Soft nontender positive bowel sounds without rebound Respiratory effort normal no respiratory distress without wheeze Musculoskeletal Comments.  Right upper extremity 3 -/5 in biceps triceps grip and 1/5 in FA Left upper extremity biceps 2/5 triceps 3 -/5 grip 3 -/5 and FA 3 -/5 Right lower extremity hip flexors 2 -/5 knee extension 1/5 cannot wiggle toes below cast with Ace wrap Left lower extremity hip flexion 2 -/5 knee extension to minus/5 dorsiflexion 0/5 and plantarflexion 2+/5 Skin. Comments.  Right lower extremity cast from toes to just below knee with half cast Neurologic.  Alert no acute distress mild to moderate expressive receptive aphasia.  Decreased to light touch per patient B/L sides.  He/She  has had improvement in activity tolerance, balance, postural control as well as ability to compensate for deficits. He/She has had improvement in  functional use RUE/LUE  and RLE/LLE as well as improvement in awareness.  Completes bed mobility with supervision requiring consistent moderate assist for squat pivot transfers requires total assist for wheelchair mobility with assist required for parts management.  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  Sitting in recliner she was able to recall a few of the activities that were completed prior to OT sessions squat pivot transfer to wheelchair towards left side requiring mod assist for facilitation.  Supervision for wheelchair propulsion.  Completed car transfers of a car height simulating a typical sedan with moderate assist needing cues to maintain weightbearing precautions.  Required minimal assist for trunk support for coming from supine to sitting at mat table.  Completed lateral scoot transfers to her left side with moderate assist with physical therapy holding her right lower extremity.  Patient donned doffed shirt, bathed upper body seated at sink with supervision.  Needed min mod cues to promote self hand overhand of right upper extremity during functional use to control ataxia.  Brush teeth with minimal assist to apply toothpaste to toothbrush and attempted with right upper extremity but switched to left upper extremity due to significant ataxic movement.  Patient participated in receptive ID and confrontational naming task with 90% accuracy given overall min to mod verbal assist for use of naming hierarchy.  Convergent naming completed with 100% accuracy given supervision assist faded to modified independent.  Full family teaching completed plan discharge to home       Disposition: Discharge to home    Diet: Diabetic diet  Special Instructions: No driving smoking or alcohol   Nonweightbearing right lower extremity  Plan aspirin 81 mg daily and Plavix 75 mg daily x90 days then aspirin alone  Follow-up with  primary care provider on blood pressure management   Medications at discharge. 1.  Tylenol as needed 2.  Aspirin 81 mg p.o. daily 3.  B complex with vitamin C tablet daily 4.  Plavix any 5 mg p.o. daily until May 06, 2022 and stop 5.  Colace 100 mg p.o.twice daily 6.  HCTZ 25 mg p.o. daily 7 Lantus insulin 13 units daily 8.  Lyrica 25 mg nightly 9.  Crestor 40 mg p.o. nightly  30-35 minutes were spent completing discharge summary and discharge planning Discharge Instructions     Ambulatory referral to Neurology   Complete by: As directed    An appointment is requested in approximately: 4 weeks left occipital, temporal, thalamus infarction   Ambulatory referral to Physical Medicine Rehab   Complete by: As directed    Moderate complexity follow-up 1 to 2 weeks left thalamic infarction        Follow-up Information     Raulkar, Clide Deutscher, MD Follow up.   Specialty: Physical Medicine and Rehabilitation Why: Office to call for appointment Contact information: [id number] [place] [address] [phone]         Poggi, Marshall Cork, MD Follow up.   Specialty: Orthopedic Surgery Why: Call for appointment Contact information: [address] [place] [address] [phone]                 Signed: Lavon Paganini [place] 02/17/2022, 5:27 AM

## 2022-02-15 LAB — GLUCOSE, CAPILLARY
Glucose-Capillary: 260 mg/dL — ABNORMAL HIGH (ref 70–99)
Glucose-Capillary: 211 mg/dL — ABNORMAL HIGH (ref 70–99)
Glucose-Capillary: 259 mg/dL — ABNORMAL HIGH (ref 70–99)
Glucose-Capillary: 314 mg/dL — ABNORMAL HIGH (ref 70–99)

## 2022-02-15 MED ORDER — FLEET ENEMA 7-19 GM/118ML RE ENEM
1.0000 | ENEMA | Freq: Every day | RECTAL | Status: DC | PRN
Start: 2022-02-15 — End: 2022-02-18

## 2022-02-15 MED ORDER — SORBITOL 70 % SOLN
60.0000 mL | Freq: Once | Status: AC
Start: 2022-02-15 — End: 2022-02-15
  Administered 2022-02-15: 60 mL via ORAL
  Filled 2022-02-15: qty 60

## 2022-02-15 MED ORDER — INSULIN GLARGINE-YFGN 100 UNIT/ML ~~LOC~~ SOLN
12.0000 [IU] | Freq: Every day | SUBCUTANEOUS | Status: DC
Start: 2022-02-16 — End: 2022-02-16
  Administered 2022-02-16: 12 [IU] via SUBCUTANEOUS
  Filled 2022-02-15: qty 0.12

## 2022-02-15 MED ORDER — PREGABALIN 50 MG PO CAPS
50.0000 mg | ORAL_CAPSULE | Freq: Every day | ORAL | Status: DC
Start: 2022-02-15 — End: 2022-02-16
  Administered 2022-02-15: 50 mg via ORAL
  Filled 2022-02-15: qty 1

## 2022-02-15 MED ORDER — SODIUM CHLORIDE 0.9 % IV SOLN: INTRAVENOUS | Status: AC

## 2022-02-15 NOTE — Progress Notes (Signed)
Patient ID: [name] Gilbert, female   DOB: 30-Sep-1960, 61 y.o.   MRN: [id number]  met with pt and left message for husband to give team conference update regarding goals and discharge still 6/10. Hoping husband comes tomorrow for education at 1:00 pm. Will call him in the am to remind him. Pt is looking forward to going home Sat. Have ordered equipment for home and pt may need to go home via ambulance due to steps if ramp not in place.

## 2022-02-15 NOTE — Progress Notes (Signed)
Physical Therapy Session Note  Patient Details  Name: [name] Gilbert MRN: [id number] Date of Birth: [birth year]  Today's Date: 02/15/2022 PT Individual Time: 0915-1024 PT Individual Time Calculation (min): 69 min   Short Term Goals: Week 2:  PT Short Term Goal 1 (Week 2): STG = LTG due to ELOS  Skilled Therapeutic Interventions/Progress Updates:     Pt sitting in recliner to start - agreeable to PT tx. Denies pain. Has flat affect throughout session. Unable to recall me as her PT. Completed squat<>pivot transfer with modA from recliner to w/c, towards her weaker R side - continues to struggle with NWB compliance on RLE during transfers - also lacks motor planning for consistency during transfers. Instructed in w/c propulsion using BUE - pt reports "I forgot how." Required cues for hand placement and technique and then she was able to propel herself ~40ft with supervision but required minA for navigating environmental barriers and doorways. She drifts to the R and benefits from increased cues for incorporating RUE into propulsion. Placed theraband around R rim to improve awareness during propulsion. She was able to propel herself ~50ft with supervision with improved speed and efficiency in maintaining straight path.   SB transfer to her weaker R side from w/c to mat table with totalA for board placement and minA for scooting to her R. She required step by step cues and unable to recall previous techniques from prior sessions. She required modA for posterior scooting at mat table. Worked on sitting balance, selective attention, visual tracking, and hand/eye coordination with sitting ball toss - pt lacks adequate proprioception in RUE during toss with several failed catches, relying on body rather than [facility] of hands. Continued activity with lateral scooting along mat table to encourage carryover into SB transfers. Much better efficiency with scooting to her L than her R side. PT holding RLE in air to  inhibit weightbearing. SB transfer to his L side with totalA for board placement and minA for scooting with PT holding her RLE in air. Able to reposition self in w/c without assist but with ++ time.   Transported to ortho rehab gym to work on [employer] system to target visual tracking, R attention, and RUE proprioception training. 0.75lb ankle weight attached to wrist of R UE. User paced visual target training with 25% accuracy and delayed visual scanning to her R. She was also distracted by reflection from screen. AAROM for facilitating forward trunk lean and reaching outside BOS while seated in w/c.   Returned to her room and pt requesting to return to recliner. ModA squat<>pivot transfer with poor compliance of WB restrictions. BLE elevated, safety belt alarm on, call bell in lap.   Therapy Documentation Precautions:  Precautions Precautions: Fall Restrictions Weight Bearing Restrictions: Yes RLE Weight Bearing: Non weight bearing General:    Therapy/Group: Individual Therapy  Valyncia Wiens P Mingo Siegert 02/15/2022, 7:30 AM

## 2022-02-15 NOTE — Plan of Care (Signed)
  Problem: RH Cognition - SLP Goal: RH LTG Patient will demonstrate orientation with cues Description:  LTG:  Patient will demonstrate orientation to person/place/time/situation with cues (SLP)   Flowsheets (Taken 02/15/2022 1337) LTG: Patient will demonstrate orientation using cueing (SLP): Minimal Assistance - Patient > 75% Note: Slow progress and carryover   Problem: RH Comprehension Communication Goal: LTG Patient will comprehend basic/complex auditory (SLP) Description: LTG: Patient will comprehend basic/complex auditory information with cues (SLP). Flowsheets (Taken 02/15/2022 1337) LTG: Patient will comprehend auditory information with cueing (SLP): Moderate Assistance - Patient 50 - 74% Note: Slow progress and carryover    Problem: RH Expression Communication Goal: LTG Patient will verbally express basic/complex needs(SLP) Description: LTG:  Patient will verbally express basic/complex needs, wants or ideas with cues  (SLP) Flowsheets (Taken 02/15/2022 1337) LTG: Patient will verbally express basic/complex needs, wants or ideas (SLP): Moderate Assistance - Patient 50 - 74% Note: Slow progress and carryover Goal: LTG Patient will increase word finding of common (SLP) Description: LTG:  Patient will increase word finding of common objects/daily info/abstract thoughts with cues using compensatory strategies (SLP). Flowsheets (Taken 02/15/2022 1337) LTG: Patient will increase word finding of common (SLP): Minimal Assistance - Patient > 75%   Problem: RH Awareness Goal: LTG: Patient will demonstrate awareness during functional activites type of (SLP) Description: LTG: Patient will demonstrate awareness during functional activites type of (SLP) Flowsheets (Taken 02/15/2022 1337) LTG: Patient will demonstrate awareness during cognitive/linguistic activities with assistance of (SLP): Moderate Assistance - Patient 50 - 74% Note: Slow progress and carryover    Problem: RH Expression  Communication Goal: LTG Patient will verbally express basic/complex needs(SLP) Description: LTG:  Patient will verbally express basic/complex needs, wants or ideas with cues  (SLP) Flowsheets (Taken 02/15/2022 1337) LTG: Patient will verbally express basic/complex needs, wants or ideas (SLP): Moderate Assistance - Patient 50 - 74% Note: Slow progress and carryover Goal: LTG Patient will increase word finding of common (SLP) Description: LTG:  Patient will increase word finding of common objects/daily info/abstract thoughts with cues using compensatory strategies (SLP). Flowsheets (Taken 02/15/2022 1337) LTG: Patient will increase word finding of common (SLP): Minimal Assistance - Patient > 75%

## 2022-02-15 NOTE — Patient Care Conference (Signed)
Inpatient RehabilitationTeam Conference and Plan of Care Update Date: 02/15/2022   Time: 11:14 AM    Patient Name: [name] Gilbert      Medical Record Number: [id number]  Date of Birth: Jan 10, 1961 Sex: Female         Room/Bed: 4M10C/4M10C-01 Payor Info: Payor: [employer] MEDICARE / Plan: [employer] MEDICARE HMO / Product Type: *No Product type* /    Admit Date/Time:  02/02/2022  1:50 PM  Primary Diagnosis:  Left thalamic infarction [facility])  Hospital Problems: Principal Problem:   Left thalamic infarction [facility])    Expected Discharge Date: Expected Discharge Date: 02/18/22  Team Members Present: Physician leading conference: Dr. Sula Soda Social Worker Present: Dossie Der, LCSW Nurse Present: Chana Bode, RN PT Present: Wynelle Link, PT OT Present: Dolphus Jenny, OT SLP Present: Sarita Bottom, SLP PPS Coordinator present : Edson Snowball, PT     Current Status/Progress Goal Weekly Team Focus  Bowel/Bladder   incontinent to B/B. last BM-5/27 -documented.  to regain continence  assess q shift and PRN. continue with stool softners and laxatives as scheduled.   Swallow/Nutrition/ Hydration   Dys 3 textures with thin liquids  Supervision A  Upgrading to regular if able   ADL's   min A sliding board transfers, setup UB self care, min assist LB self care at bed level  min A LB self care and transfers; sup UB self care  self care training, neuro re-ed RUE, visual scanning and left attention, pt edu/training, hemi techniques, dc planning, transfer training, safety awareness edu   Mobility   minA bed mobility, modA squat<>pivot transfers with difficulty maintaining WBing restrictions, modA sit<>stand in // bars with poor adherence to [facility] restrictions. Continues to have impaired motor planning, R inattention, delayed initiation, decreased memory/recall  min assist overall at wheelchair level  Custom wheelchair eval completed, bed mobility, transfer training, w/c management, LE  strengthening, family education   Communication   Express at phrase and short sentence level; overall Mod A.  Supervision-Min A  Naming objects, following commands, low-tech AAC reinforcement/options   Safety/Cognition/ Behavioral Observations  Mod A  Sup to Min A  orientation and awareness of deficits   Pain   denies pain  pain<3  assess pain q shift and PRN   Skin   skin intact, soft cast to RLE  skin to remain intact  assess skin q shift and PRN     Discharge Planning:  Husband frustrated with pt and wanting her to do well, feels will do same always does once home do very little. Family ed scheduled for Thursday unsure if husband will come due to his frustration with pt, aware team recommends it   Team Discussion: Patient continues to display flat affect, poor motor planning, delayed response/processing, poor attention and recall, no carry over of information with poor insight into deficits.  Patient on target to meet rehab goals: no, needs mod- max.    *See Care Plan and progress notes for long and short-term goals.   Revisions to Treatment Plan:  N/A   Teaching Needs: Safety, medications, dietary modifications, transfers, toileting, etc.  Current Barriers to Discharge: Decreased caregiver support, Home enviroment access/layout, and Weight bearing restrictions and no insurance coverage for SNF.  Possible Resolutions to Barriers: Family education HH follow up services DME; custom W/C and slideboard; loaner chair delivered 02/15/22 Ramp recommended for entry to home Non emergent transport to home at discharge     Medical Summary Current Status: denies pain, flat affect, hypoglycemic earlier this  week and hyperglycemic at times, poor initiation, obesity  Barriers to Discharge: Weight;Wound care;Medical stability  Barriers to Discharge Comments: denies pain, flat affect, hypoglycemic earlier this week and hyperglycemic at times, poor initiation, obesity Possible Resolutions  to Barriers/Weekly Focus: decrease lyrica, decreased short acting scale, increase long acting insulin, continue daily wound care, provided dietary education       I attest that I was present, lead the team conference, and concur with the assessment and plan of the team.   Chana Bode B 02/15/2022, 3:41 PM

## 2022-02-15 NOTE — Progress Notes (Signed)
PROGRESS NOTE   Subjective/Complaints: Hyperglycemic now-increased long acting insulin Denies pain: decrease lyrica Will discontinue amantadine since no improvements noted.  D/c on the 10th  ROS:  Pt denies SOB, abd pain, CP, N/V/C/D, and vision changes,no headache, right ankle pain   Objective:   No results found. Recent Labs    02/14/22 0504  WBC 6.3  HGB 11.7*  HCT 35.8*  PLT 284    Recent Labs    02/14/22 0504  NA 139  K 4.2  CL 104  CO2 27  GLUCOSE 50*  BUN 46*  CREATININE 2.48*  CALCIUM 8.7*     Intake/Output Summary (Last 24 hours) at 02/15/2022 1119 Last data filed at 02/15/2022 0834 Gross per 24 hour  Intake 471 ml  Output 0 ml  Net 471 ml        Physical Exam: Vital Signs Blood pressure 139/67, pulse 82, temperature 98.5 F (36.9 C), temperature source Oral, resp. rate 16, height 5' 2.01" (1.575 m), weight 74.9 kg, SpO2 100 %.  General: No acute distress, BMI 30.19 Mood and affect are appropriate Heart: Regular rate and rhythm no rubs murmurs or extra sounds Lungs: Clear to auscultation, no rales or wheezes, normal effort Abdomen: Positive bowel sounds, soft nontender to palpation, nondistended Extremities: No clubbing, cyanosis, or edema Skin: No evidence of breakdown, no evidence of rash   Musculoskeletal:        General: Alert, in bed    Cervical back: Neck supple. No tenderness.     Comments: RUE 3-/5 in biceps, triceps, grip and 1/5 in FA LUE- Biceps 2/5; triceps 3-/5; grip 3-/5 and FA 3-/5 RLE- HF 2-/5; KE 1/5; cannot wiggle toes below cast with ACE wrap LLE- HF 2-/5; KE 2-/5; DF 0/5 and PF 2+/5    However spontaneous movement in R side and L side better than tested when reached for remote to use it  Skin:    Comments: RLE cast from toes to just below knee with half cast; half ACE wrap No skin breakdown seen  Neurological:     Comments: Patient is alert.  Disoriented to  time. No acute distress.  She does display mild to moderate expressive receptive aphasia. Moderate aphasia noted Decreased to light touch per pt on B/L sides- no difference between both sides  No reading comprehension Poor initiation Max cues for recall Psychiatric:     Comments: So extremely flat affect and facies    Assessment/Plan: 1. Functional deficits which require 3+ hours per day of interdisciplinary therapy in a comprehensive inpatient rehab setting. Physiatrist is providing close team supervision and 24 hour management of active medical problems listed below. Physiatrist and rehab team continue to assess barriers to discharge/monitor patient progress toward functional and medical goals  Care Tool:  Bathing    Body parts bathed by patient: Right arm, Chest, Abdomen, Left arm, Front perineal area, Right upper leg, Left upper leg, Left lower leg, Face   Body parts bathed by helper: Right lower leg, Buttocks     Bathing assist Assist Level: Maximal Assistance - Patient 24 - 49%     Upper Body Dressing/Undressing Upper body dressing   What is the  patient wearing?: Pull over shirt    Upper body assist Assist Level: Moderate Assistance - Patient 50 - 74%    Lower Body Dressing/Undressing Lower body dressing      What is the patient wearing?: Pants     Lower body assist Assist for lower body dressing: Maximal Assistance - Patient 25 - 49%     Toileting Toileting    Toileting assist Assist for toileting: Dependent - Patient 0%     Transfers Chair/bed transfer  Transfers assist     Chair/bed transfer assist level: Minimal Assistance - Patient > 75% (// bars)     Locomotion Ambulation   Ambulation assist   Ambulation activity did not occur: Safety/medical concerns          Walk 10 feet activity   Assist  Walk 10 feet activity did not occur: Safety/medical concerns        Walk 50 feet activity   Assist Walk 50 feet with 2 turns activity  did not occur: Safety/medical concerns         Walk 150 feet activity   Assist Walk 150 feet activity did not occur: Safety/medical concerns         Walk 10 feet on uneven surface  activity   Assist Walk 10 feet on uneven surfaces activity did not occur: Safety/medical concerns         Wheelchair     Assist Is the patient using a wheelchair?: Yes Type of Wheelchair: Manual    Wheelchair assist level: Dependent - Patient 0%      Wheelchair 50 feet with 2 turns activity    Assist        Assist Level: Dependent - Patient 0%   Wheelchair 150 feet activity     Assist      Assist Level: Dependent - Patient 0%   Blood pressure 139/67, pulse 82, temperature 98.5 F (36.9 C), temperature source Oral, resp. rate 16, height 5' 2.01" (1.575 m), weight 74.9 kg, SpO2 100 %.    Medical Problem List and Plan: 1. Functional deficits secondary to large acute to subacute infarct posterior medial left temporal and occipital lobe and left thalamus in the setting of multiple intracranial stenosis as well as history of CVA with left-sided weakness/aphasia             -patient may  shower- bag RLE             -ELOS/Goals: 14-16 days- min A to supervision  Continue CIR- PT, OT and SLP  -She was able to don doff shirt and bathe UB at sink with supervision  -Interdisciplinary Team Conference today   2.  Impaired mobility and ADLs: goal of bed mobility with Mod A this week. Continue Lovenox.              -antiplatelet therapy: Aspirin 81 mg daily and Plavix 75 mg daily x90 days then aspirin alone 3. Pain: Decrease Lyrica 50 mg to HS, discontinue hydrocodone. 4. Mood: Provide emotional support             -antipsychotic agents: N/A 5. Neuropsych: This patient is?capable of making decisions on her own behalf. 6. Skin/Wound Care: Routine skin checks 7. Fluids/Electrolytes/Nutrition: Routine in and outs with follow-up chemistries 8.  Right distal fibular metaphysis  fracture with minimal lateral displacement.  Nonsurgical.  Follow-up orthopedic services Dr.Poggi.  Nonweightbearing right lower extremity Pt not using pain meds, no falls, occ puts a little weight on RLE as per OT but  using sling board for transfers , discussed with RN , may need to offer pt tylenol before tharapy  9.  Diabetes mellitus.  Hemoglobin A1c 11.1. Change Semglee back to daily dosing. Check blood sugars before meals and at bedtime. Recommended choosing low added sugar/high protein foods. Decrease Novolog sliding scale for hypoglycemia. Changed diet to carb modified.  10.  Seizure prophylaxis.  d/c Keppra 250 mg as has completed 16 day course 11.  Permissive hypertension.  Presently on HCTZ 25 mg daily.  Patient on amlodipine 10 mg daily, hydralazine 25 mg 3 times daily, Coreg 25 mg twice daily, Aldactone 150 mg daily PTA.  Vitals:   02/14/22 2002 02/15/22 0420  BP: (!) 150/62 139/67  Pulse: 80 82  Resp: 16 16  Temp: 98.3 F (36.8 C) 98.5 F (36.9 C)  SpO2: 100% 100%  Well controlled 5/30 12.  Diastolic congestive heart failure.  Monitor for any signs of fluid overload  5/27- will check daily weights- will order 13.  AKI on CKD stage III.  Creatinine baseline 1.4-1.7.  Follow-up chemistries. Now up to 2.48. Will start fluids HS. Check creatinine tomorrow.  14.  Hyperlipidemia.  Crestor 15. Constipation- add colace daily.  -6/5 sorbitol ordered x1, prn dulcolax 16. Obesity: BMI 30.72-->29.87-->29.91-->30.19: provided list of foods to help with weight loss. 17. Low protein: discussed choosing high protein and low added sugar foods 18. Poor initiation: continue amantadine 100mg  daily 19. Dysphagia: continue D3 trials    LOS: 13 days A FACE TO FACE EVALUATION WAS PERFORMED  Yalena Colon P Marice Guidone 02/15/2022, 11:19 AM

## 2022-02-15 NOTE — Progress Notes (Signed)
Occupational Therapy Session Note  Patient Details  Name: [name] Gilbert MRN: [id number] Date of Birth: 1961/09/02  Today's Date: 02/15/2022 OT Individual Time: 1300-1327 OT Individual Time Calculation (min): 27 min    Short Term Goals: Week 2:  OT Short Term Goal 1 (Week 2): STGs=LTGs due to ELOS  Skilled Therapeutic Interventions/Progress Updates:  Pt greeted supine in bed agreeable to OT intervention. Session focus on transfer training via SB and squat pivot. Pt reports wanting to don pants. Pt needed MAX A to don pants via rolling in bed as pt tried to stand but was putting weight through RLE therefore deferred task and had pt complete LB dressing from supine for safety.  Pt completed bed mobility MIN A with MIN verbal cues for sequencing. Pt completed squat pivot from EOB<>w/c with MIN A and therapist foot underneath her foot to maintain WB restrictions. Pt also completed SB transfer from w/c>EOB, had pt teach OTA the steps to increase carryover however pt needed MAX step by step cues to set up transfer correctly.  Pt left supine in bed with bed alarm activated and all needs within reach.   Therapy Documentation Precautions:  Precautions Precautions: Fall Restrictions Weight Bearing Restrictions: Yes RLE Weight Bearing: Non weight bearing  Pain: No pain reported during session    Therapy/Group: Individual Therapy  Barron Schmid 02/15/2022, 3:55 PM

## 2022-02-15 NOTE — Progress Notes (Signed)
Speech Language Pathology Daily Session Note  Patient Details  Name: [name] Gilbert MRN: [id number] Date of Birth: [birth year]  Today's Date: 02/15/2022 SLP Individual Time: 2876-8115 SLP Individual Time Calculation (min): 44 min  Short Term Goals: Week 2: SLP Short Term Goal 1 (Week 2): STG=LTG due to EOS (6/10)  Skilled Therapeutic Interventions:Skilled ST services focused on cognitive skills. Pt was orientated x4, except to situation ("I fell") and date. SLP facilitated following 1 step commands in witting/drawing tasks involving 4 shapes (ex: draw a line over the triangle), pt was only able to follow 1 step commands with demonstration cue and had difficulty distinguishing the shapes (likely due to visual deficits.) Pt was able to sort card by 2 colors, following 1 step command, pt required max-mod A verbal cues for error awareness and attention. Pt was left in room with call bell within reach and chair alarm set. SLP downgraded communication and cognition goals to mod A due to poor carryover and slow progress. SLP recommends to continue skilled services.     Pain Pain Assessment Pain Score: 0-No pain  Therapy/Group: Individual Therapy  [name] Gilbert  [facility] 02/15/2022, 1:35 PM

## 2022-02-15 NOTE — Progress Notes (Signed)
Occupational Therapy Session Note  Patient Details  Name: [name] Gilbert MRN: [id number] Date of Birth: Apr 06, 1961  Today's Date: 02/15/2022 OT Individual Time: [id number] Individual Time Calculation (min): 55 min    Short Term Goals: Week 2:  OT Short Term Goal 1 (Week 2): STGs=LTGs due to ELOS  Skilled Therapeutic Interventions/Progress Updates:    Subjective: Pt states she is wet and did not feel when she had to urinate.  Requesting to wash up and brush teeth this session. No c/o pain.    Objective:  Pt semi reclined in bed. Pt completed pericare and clothing management at bed level with min assist and mod Vcs to prevent weightbearing through RLE. Donned clean pants with min assist as well at bed level. Supine to sit with supervision.  SB transfer with min assist to w/c.  Pt self propelled to sink and brushed teeth, washed face, doffed/donned shirt with setup sitting sinkside.  Pt requesting to sit up in recliner at end of session.  SB transfer w/c to recliner with min assist.  BLE elevated.  Call bell in reach, seat alarm on.     Assessment:  Downgraded LB self care to bed level due to pt continues to need frequent cues to follow weight bearing precautions secondary to poor insight into own deficits, poor memory, and impaired safety awareness as well as impaired motor planning.   Plan: Pt would benefit from further training on blocked practice sliding board transfers.   Therapy Documentation Precautions:  Precautions Precautions: Fall Restrictions Weight Bearing Restrictions: Yes RLE Weight Bearing: Non weight bearing    Therapy/Group: Individual Therapy  Ezekiel Slocumb 02/15/2022, 11:40 AM

## 2022-02-15 NOTE — Plan of Care (Signed)
  Problem: RH Balance - DC, pt unable to comply with weight bearing restrictions on RLE Goal: LTG Patient will maintain dynamic standing balance (PT) Description: LTG:  Patient will maintain dynamic standing balance with assistance during mobility activities (PT) Outcome: Not Applicable   Problem: Sit to Stand - DC, pt unable to comply with weight bearing restrictions on RLE Goal: LTG:  Patient will perform sit to stand with assistance level (PT) Description: LTG:  Patient will perform sit to stand with assistance level (PT) Outcome: Not Applicable   Problem: RH Car Transfers - DC, pt will require ambulance ride home. Goal: LTG Patient will perform car transfers with assist (PT) Description: LTG: Patient will perform car transfers with assistance (PT). Outcome: Not Applicable   Problem: RH Bed to Chair Transfers - downgraded due to slow progress  Goal: LTG Patient will perform bed/chair transfers w/assist (PT) Description: LTG: Patient will perform bed to chair transfers with assistance (PT). Flowsheets (Taken 02/15/2022 1120) LTG: Pt will perform Bed to Chair Transfers with assistance level: Moderate Assistance - Patient 50 - 74%

## 2022-02-16 ENCOUNTER — Inpatient Hospital Stay (HOSPITAL_COMMUNITY): Payer: Medicare HMO

## 2022-02-16 LAB — BASIC METABOLIC PANEL
Anion gap: 6 (ref 5–15)
BUN: 47 mg/dL — ABNORMAL HIGH (ref 8–23)
CO2: 26 mmol/L (ref 22–32)
Calcium: 8.7 mg/dL — ABNORMAL LOW (ref 8.9–10.3)
Chloride: 105 mmol/L (ref 98–111)
Creatinine, Ser: 2.41 mg/dL — ABNORMAL HIGH (ref 0.44–1.00)
GFR, Estimated: 22 mL/min — ABNORMAL LOW (ref >60)
Glucose, Bld: 283 mg/dL — ABNORMAL HIGH (ref 70–99)
Potassium: 4.2 mmol/L (ref 3.5–5.1)
Sodium: 137 mmol/L (ref 135–145)

## 2022-02-16 LAB — GLUCOSE, CAPILLARY
Glucose-Capillary: 253 mg/dL — ABNORMAL HIGH (ref 70–99)
Glucose-Capillary: 333 mg/dL — ABNORMAL HIGH (ref 70–99)
Glucose-Capillary: 332 mg/dL — ABNORMAL HIGH (ref 70–99)
Glucose-Capillary: 287 mg/dL — ABNORMAL HIGH (ref 70–99)

## 2022-02-16 MED ORDER — PREGABALIN 25 MG PO CAPS
25.0000 mg | ORAL_CAPSULE | Freq: Every day | ORAL | Status: DC
  Administered 2022-02-16 – 2022-02-17 (×2): 25 mg via ORAL
  Filled 2022-02-16 (×2): qty 1

## 2022-02-16 MED ORDER — SENNA 8.6 MG PO TABS
2.0000 | ORAL_TABLET | Freq: Every day | ORAL | Status: DC
  Administered 2022-02-16 – 2022-02-17 (×2): 17.2 mg via ORAL
  Filled 2022-02-16 (×2): qty 2

## 2022-02-16 MED ORDER — INSULIN GLARGINE-YFGN 100 UNIT/ML ~~LOC~~ SOLN
13.0000 [IU] | Freq: Every day | SUBCUTANEOUS | Status: DC
  Administered 2022-02-17 – 2022-02-18 (×2): 13 [IU] via SUBCUTANEOUS
  Filled 2022-02-16 (×2): qty 0.13

## 2022-02-16 NOTE — Progress Notes (Signed)
Physical Therapy Session Note  Patient Details  Name: [name] Gilbert MRN: [id number] Date of Birth: Apr 01, 1961  Today's Date: 02/16/2022 PT Individual Time: 1100-1155 + 1300-1345 PT Individual Time Calculation (min): 55 min + 45 min  Short Term Goals: Week 1:  PT Short Term Goal 1 (Week 1): Pt will complete bed mobility with modA PT Short Term Goal 1 - Progress (Week 1): Met PT Short Term Goal 2 (Week 1): Pt will complete bed<>chair transfers with modA and LRAD PT Short Term Goal 2 - Progress (Week 1): Met PT Short Term Goal 3 (Week 1): Pt will propel wheelchair 38f with modA PT Short Term Goal 3 - Progress (Week 1): Not met  Skilled Therapeutic Interventions/Progress Updates:     1st session: Pt received supine in bed sleeping, awakens to voice and is agreeable to PT tx. Reports no pain. Focused session on w/c mobility and blocked practiced sliding board transfers. Pt now has loaner ultra lightweight wheelchair in her room. Applied theraband to R rim to improve awareness and grip during w/c propulsion.   Pt donned pants with modA at bed level due to inability to stand (noncompliant with WB status). Required assist for threading RLE and pulling over hips at bed level. SB transfer with tWinstedfor board placement and minA for scooting into w/c.   She propelled herself ~108fin lightweight w/c with supervision using BUE to propel. Improved stroke efficiency, speed, and distance compared to standard Drive wheelchair.   Remainder of session focused on blocked practice SB transfers from mat table <> wheelchair, towards both R/L directions. She required CGA/minA for transferring to her R and min/modA for transferring to her L. She continues to require totalA for SB placement but she did improve understanding of sequencing, setup, and technique. She also continues to lack awareness of NWB status during functional transfers.   Pt returned to her room and completed squat<>pivot transfer with modA  to her L side to the recliner. BLE elevated, safety alarm on, call bell in reach. Direct handoff of care to NT for blood sugar testing piror to lunch.  2nd session: Pt sitting in recliner to start. NT taking vitals - WNL. Husband at the bedside for scheduled family education/training. Pt denies pain. Spent time reviewing DC plan, home safety, PT goals, pt's current mobility status, falls risk, NWB on RLE, and primary deficits related to CVA. He confirms that a ramp will be built prior to pt discharging, should be completed by Friday PM. Reviewed w/c management with her loaner wheelchair - folding/unfolding, brakes, arm rests, and leg rest management. Reviewed and demonstrated SB transfers to improve understanding of technique, setup, and sequencing. Pt completed squat<>pivot transfer with minA to the w/c with poor compliance of WB restrictions. Transported to ortho rehab gym for time. Completed SB transfer to L/R directions with setupA for w/c and totalA for board placement. Pt able to complete SB transfer with close supervision after board placed. Then completed car transfer with car height simulating FoMickel Fuchs she completed this as well with similar setup and close supervision. Husband actively observing all transfers. Limited time to have hands on practice but encouraged him to come tomorrow for continued training and education. Pt returned to her room and remained seated in w/c with husband at bedside. All needs met. Informed of upcoming OT family training session.    Therapy Documentation Precautions:  Precautions Precautions: Fall Restrictions Weight Bearing Restrictions: Yes RLE Weight Bearing: Non weight bearing General:    Therapy/Group:  Individual Therapy  Alger Simons 02/16/2022, 7:36 AM

## 2022-02-16 NOTE — Progress Notes (Signed)
PROGRESS NOTE   Subjective/Complaints: No new complaints Messaged nurse to see if still constipated Denies pain: will continue to decrease Lyrica CBGs 253-314: will increase Semglee  ROS:  Pt denies SOB, abd pain, CP, N/V/C/D, and vision changes,no headache, right ankle pain   Objective:   No results found. Recent Labs    02/14/22 0504  WBC 6.3  HGB 11.7*  HCT 35.8*  PLT 284    Recent Labs    02/14/22 0504 02/16/22 0530  NA 139 137  K 4.2 4.2  CL 104 105  CO2 27 26  GLUCOSE 50* 283*  BUN 46* 47*  CREATININE 2.48* 2.41*  CALCIUM 8.7* 8.7*     Intake/Output Summary (Last 24 hours) at 02/16/2022 1110 Last data filed at 02/16/2022 0831 Gross per 24 hour  Intake 773.15 ml  Output --  Net 773.15 ml        Physical Exam: Vital Signs Blood pressure (!) 163/80, pulse 85, temperature 98.3 F (36.8 C), temperature source Oral, resp. rate 17, height 5' 2.01" (1.575 m), weight 75.2 kg, SpO2 100 %.  General: No acute distress, BMI 30.31 Mood and affect are appropriate Heart: Regular rate and rhythm no rubs murmurs or extra sounds Lungs: Clear to auscultation, no rales or wheezes, normal effort Abdomen: Positive bowel sounds, soft nontender to palpation, nondistended Extremities: No clubbing, cyanosis, or edema Skin: No evidence of breakdown, no evidence of rash   Musculoskeletal:        General: Alert, in bed    Cervical back: Neck supple. No tenderness.     Comments: RUE 3-/5 in biceps, triceps, grip and 1/5 in FA LUE- Biceps 2/5; triceps 3-/5; grip 3-/5 and FA 3-/5 RLE- HF 2-/5; KE 1/5; cannot wiggle toes below cast with ACE wrap LLE- HF 2-/5; KE 2-/5; DF 0/5 and PF 2+/5    However spontaneous movement in R side and L side better than tested when reached for remote to use it  Skin:    Comments: RLE cast from toes to just below knee with half cast; half ACE wrap No skin breakdown seen  Neurological:      Comments: Patient is alert.  Disoriented to time. No acute distress.  She does display mild to moderate expressive receptive aphasia. Moderate aphasia noted Decreased to light touch per pt on B/L sides- no difference between both sides  No reading comprehension Poor initiation Max cues for recall Very poor carryover from day to day Psychiatric:     Comments: So extremely flat affect and facies    Assessment/Plan: 1. Functional deficits which require 3+ hours per day of interdisciplinary therapy in a comprehensive inpatient rehab setting. Physiatrist is providing close team supervision and 24 hour management of active medical problems listed below. Physiatrist and rehab team continue to assess barriers to discharge/monitor patient progress toward functional and medical goals  Care Tool:  Bathing    Body parts bathed by patient: Right arm, Chest, Abdomen, Left arm, Front perineal area, Right upper leg, Left upper leg, Left lower leg, Face   Body parts bathed by helper: Right lower leg, Buttocks     Bathing assist Assist Level: Maximal Assistance - Patient  24 - 49%     Upper Body Dressing/Undressing Upper body dressing   What is the patient wearing?: Pull over shirt    Upper body assist Assist Level: Moderate Assistance - Patient 50 - 74%    Lower Body Dressing/Undressing Lower body dressing      What is the patient wearing?: Pants     Lower body assist Assist for lower body dressing: Maximal Assistance - Patient 25 - 49% (bed level)     Toileting Toileting    Toileting assist Assist for toileting: Dependent - Patient 0%     Transfers Chair/bed transfer  Transfers assist     Chair/bed transfer assist level: Minimal Assistance - Patient > 75% (SB txfr)     Locomotion Ambulation   Ambulation assist   Ambulation activity did not occur: Safety/medical concerns          Walk 10 feet activity   Assist  Walk 10 feet activity did not occur:  Safety/medical concerns        Walk 50 feet activity   Assist Walk 50 feet with 2 turns activity did not occur: Safety/medical concerns         Walk 150 feet activity   Assist Walk 150 feet activity did not occur: Safety/medical concerns         Walk 10 feet on uneven surface  activity   Assist Walk 10 feet on uneven surfaces activity did not occur: Safety/medical concerns         Wheelchair     Assist Is the patient using a wheelchair?: Yes Type of Wheelchair: Manual    Wheelchair assist level: Dependent - Patient 0%      Wheelchair 50 feet with 2 turns activity    Assist        Assist Level: Dependent - Patient 0%   Wheelchair 150 feet activity     Assist      Assist Level: Dependent - Patient 0%   Blood pressure (!) 163/80, pulse 85, temperature 98.3 F (36.8 C), temperature source Oral, resp. rate 17, height 5' 2.01" (1.575 m), weight 75.2 kg, SpO2 100 %.    Medical Problem List and Plan: 1. Functional deficits secondary to large acute to subacute infarct posterior medial left temporal and occipital lobe and left thalamus in the setting of multiple intracranial stenosis as well as history of CVA with left-sided weakness/aphasia             -patient may  shower- bag RLE             -ELOS/Goals: 14-16 days- min A to supervision  Continue CIR- PT, OT and SLP  -She was able to don doff shirt and bathe UB at sink with supervision 2.  Impaired mobility and ADLs: goal of bed mobility with Mod A this week. Continue Lovenox.              -antiplatelet therapy: Aspirin 81 mg daily and Plavix 75 mg daily x90 days then aspirin alone 3. Pain: Decrease Lyrica to 25 mg to HS, discontinue hydrocodone. 4. Mood: Provide emotional support             -antipsychotic agents: N/A 5. Neuropsych: This patient is?capable of making decisions on her own behalf. 6. Skin/Wound Care: Routine skin checks 7. Fluids/Electrolytes/Nutrition: Routine in and outs  with follow-up chemistries 8.  Right distal fibular metaphysis fracture with minimal lateral displacement.  Nonsurgical.  Follow-up orthopedic services Dr.Poggi.  Nonweightbearing right lower extremity Pt not using  pain meds, no falls, occ puts a little weight on RLE as per OT but using sling board for transfers , discussed with RN , may need to offer pt tylenol before tharapy  9.  Diabetes mellitus.  Hemoglobin A1c 11.1. Increase Semglee to 13U. Check blood sugars before meals and at bedtime. Recommended choosing low added sugar/high protein foods. Discontinued Novolog sliding scale for hypoglycemia. Changed diet to carb modified.  10.  Seizure prophylaxis.  d/c Keppra 250 mg as has completed 16 day course 11.  Permissive hypertension.  Presently on HCTZ 25 mg daily.  Patient on amlodipine 10 mg daily, hydralazine 25 mg 3 times daily, Coreg 25 mg twice daily, Aldactone 150 mg daily PTA.  Vitals:   02/15/22 1938 02/16/22 0411  BP: (!) 146/71 (!) 163/80  Pulse: 80 85  Resp: 16 17  Temp: 100 F (37.8 C) 98.3 F (36.8 C)  SpO2: 100% 100%  Well controlled 6/8 12.  Diastolic congestive heart failure.  Monitor for any signs of fluid overload 13.  AKI on CKD stage III.  Creatinine baseline 1.4-1.7.  Follow-up chemistries. Now up to 2.48. Will start fluids HS. Check creatinine tomorrow.  14.  Hyperlipidemia.  Crestor 15. Constipation- continue colace TID. Add senna 2 tabs HS 16. Obesity: BMI 30.72-->29.87-->29.91-->30.19: provided list of foods to help with weight loss. 17. Low protein: discussed choosing high protein and low added sugar foods 18. Poor initiation: Amantadine trial did not help, medication d/ced 19. Dysphagia: continue D3 trials    LOS: 14 days A FACE TO FACE EVALUATION WAS PERFORMED  Maahir Horst P Viraaj Vorndran 02/16/2022, 11:10 AM

## 2022-02-16 NOTE — Progress Notes (Signed)
Patient ID: [name] Gilbert, female   DOB: 1961-08-27, 61 y.o.   MRN: [id number]  Met with pt and husband who is here and went through family education in preparation for discharge on Sat. He reports the ramp should be done tomorrow and they plan to come Sat and take her home by car-he and daughter. Pt feels good about going home and is ready. Husband is concerned about her fractured leg and her not feeling her toes. He wonders if it is healing like it is suppose to be and wanted worker to reach out to MD and ask about this. Have done this and will await response from MD. Husband reports feeling better and will go to the MD next week is his appointment. Plan for discharge Sat.

## 2022-02-16 NOTE — Progress Notes (Signed)
Speech Language Pathology Daily Session Note  Patient Details  Name: Akiko Schexnider MRN: [id number] Date of Birth: 11-Sep-1961  Today's Date: 02/16/2022 SLP Individual Time: 1500-1530 SLP Individual Time Calculation (min): 30 min  Short Term Goals: Week 2: SLP Short Term Goal 1 (Week 2): STG=LTG due to EOS (6/10)  Skilled Therapeutic Interventions:Skilled ST services focused on education. Pt's husband was present. SLP provided education pertaining to pt's cognitive, language and swallow deficits. Pt's husband could not provide a clear response, but indicated pt's cognition and swallow function is relatively close to baseline with acute deficits in expressive language. SLP provided dysphagia 3 handout, pt remains on this diet due to limited participation in regular trials. SLP educated pt's husband in semantic feature analysis to aid in word finding skills and supported the need for 24 hour supervision A. Pt was unable to name an animal in a divergent naming task, stating " I cant think about that right now."Pt's husband did not ask any questions. Pt was left in room with husband, call bell within reach and bed alarm set. SLP recommends to continue skilled services.     Pain Pain Assessment Pain Score: 0-No pain  Therapy/Group: Individual Therapy  Tilly Pernice  [facility] 02/16/2022, 3:48 PM

## 2022-02-16 NOTE — Progress Notes (Signed)
Occupational Therapy Session Note  Patient Details  Name: [name] Gilbert MRN: [id number] Date of Birth: August 23, 1961  Today's Date: 02/16/2022 OT Individual Time: 1348-1430 OT Individual Time Calculation (min): 42 min    Short Term Goals: Week 2:  OT Short Term Goal 1 (Week 2): STGs=LTGs due to ELOS  Skilled Therapeutic Interventions/Progress Updates:  Pt greeted seated in w/c with husband present. Pt and husband attempting to don pants from w/c level.  Education provided on recommendation of completing LB dressing from bed level as pt unable to lateral lean to pull pants to waist line and pt unsafe in standing to don pants. Pt completed impulsive squat pivot back to bed from w/c with MIN A as pt trying to pushing through RLE during transfer. Pt needed overall MINA to don pants with pt rolling R and L. Education provided to husband on also wearing brief at home and demo of how to don via rolling in bed, husband verbalized understanding but did not demonstrate.  Education provided on recommendation of using SB for toilet transfer to [facility]. Husband completed set- up or SB initially but needed MAX A from OTA for correct set- up. Pt observed to need MOD A as husband trying to pick her up vs assist her with scooting. Education provided on letting pt initiate scooting with husband only helping as needed to decrease risk of caregiver injury, both verbalized understanding. Pt completed SB transfer back to EOB from [facility] with overall MIN A with improved carryover of technique but pt still requires MAX multimodal cues for technique and WB restrictions. Pt then reports fatigue declining to complete anymore transfer training. Verbal education provided on decreasing risk of falls, importance of implementing ADL routine and pts current level of assist. Husband verbalized understanding. Education provided on recommendation of completing bathing and dressing from bed level.  Did provide written handout on steps for SB  transfers as well as energy conservation handout as pt continues to be limited by fatigue.  Pt left supine in bed with bed alarm activated and all needs within reach.          Therapy Documentation Precautions:  Precautions Precautions: Fall Restrictions Weight Bearing Restrictions: Yes RLE Weight Bearing: Non weight bearing  Pain: no pain reported during session    Therapy/Group: Individual Therapy  Precious Haws 02/16/2022, 3:52 PM

## 2022-02-17 ENCOUNTER — Encounter (HOSPITAL_COMMUNITY): Payer: Self-pay | Admitting: Physical Medicine and Rehabilitation

## 2022-02-17 DIAGNOSIS — I10 Essential (primary) hypertension: Secondary | ICD-10-CM

## 2022-02-17 LAB — GLUCOSE, CAPILLARY
Glucose-Capillary: 103 mg/dL — ABNORMAL HIGH (ref 70–99)
Glucose-Capillary: 167 mg/dL — ABNORMAL HIGH (ref 70–99)
Glucose-Capillary: 316 mg/dL — ABNORMAL HIGH (ref 70–99)
Glucose-Capillary: 308 mg/dL — ABNORMAL HIGH (ref 70–99)

## 2022-02-17 MED ORDER — HYDROCHLOROTHIAZIDE 25 MG PO TABS: 25.0000 mg | ORAL_TABLET | Freq: Every day | ORAL | 0 refills | Status: DC

## 2022-02-17 MED ORDER — INSULIN GLARGINE 100 UNIT/ML SOLOSTAR PEN
13.0000 [IU] | PEN_INJECTOR | Freq: Every day | SUBCUTANEOUS | 11 refills | Status: DC
Start: 2022-02-17 — End: 2022-10-25

## 2022-02-17 MED ORDER — DOCUSATE SODIUM 100 MG PO CAPS: 100.0000 mg | ORAL_CAPSULE | Freq: Two times a day (BID) | ORAL | 0 refills | Status: DC

## 2022-02-17 MED ORDER — ACETAMINOPHEN 325 MG PO TABS: 650.0000 mg | ORAL_TABLET | ORAL | Status: DC | PRN

## 2022-02-17 MED ORDER — ROSUVASTATIN CALCIUM 40 MG PO TABS: 40.0000 mg | ORAL_TABLET | Freq: Every day | ORAL | 0 refills | Status: DC

## 2022-02-17 MED ORDER — CLOPIDOGREL BISULFATE 75 MG PO TABS: 75.0000 mg | ORAL_TABLET | Freq: Every day | ORAL | 0 refills | Status: DC

## 2022-02-17 MED ORDER — PREGABALIN 25 MG PO CAPS: 25.0000 mg | ORAL_CAPSULE | Freq: Every day | ORAL | 0 refills | Status: DC

## 2022-02-17 MED ORDER — B COMPLEX-C PO TABS: 1.0000 | ORAL_TABLET | Freq: Every day | ORAL | 0 refills | Status: DC

## 2022-02-17 NOTE — Progress Notes (Signed)
Occupational Therapy Discharge Summary  Patient Details  Name: [name] Gilbert MRN: [id number] Date of Birth: 11/27/1960  Today's Date: 02/17/2022 OT Individual Time: 1505-6979 OT Individual Time Calculation (min): 75 min    Patient has met 7 of 7 long term goals due to improved activity tolerance, improved balance, postural control, ability to compensate for deficits, and functional use of  RIGHT upper extremity.  Patient to discharge at [facility] Assist for LB self care at bed level, supervision sitting sinkside for UB self care, and min assist using sliding board for functional transfers.  Pt still presents with incontinence but she is able to complete pericare and clothing management at bed level with min assist.  Patient's care partner participated in family education and training in [place], however he still requires assistance to provide the necessary physical assistance and will benefit from further training by Home Health OT.  Pts husband is independent and able to assist in providing needed cognitive assistance at discharge.    Skilled Intervention: Pt semi reclined in bed, no c/o pain, nodding head yes when prompted to initiate LB self care.  Pt needed min assist and step by step Vcs to complete bed level LB bathing and dressing.  Pt completed supine to sit with supervision and sliding board transfer to w/c with min assist.  Self propelled to sink and doffed/donned shirt, bathed UB, and brushed teeth with supervision in seated position.  Pt needed frequent Vcs during brushing teeth to implement hand over hand stabilization compensatory technique to reduce ataxic movements.  Pt agreeable to staying up in w/c at end of session.  Call bell in reach, seat alarm on.  Reasons goals not met: NA  Recommendation:  Patient will benefit from ongoing skilled OT services in home health setting to continue to advance functional skills in the area of BADL and iADL, caregiver education and training on  assisting with functional transfers and bed level self care, and home environmental assessment   Equipment: W/c and sliding board  Reasons for discharge: treatment goals met and discharge from hospital  Patient/family agrees with progress made and goals achieved: Yes  OT Discharge Precautions/Restrictions  Precautions Precautions: Fall Precaution Comments: R hemi, RLE NWB Restrictions Weight Bearing Restrictions: Yes RLE Weight Bearing: Non weight bearing Pain Pain Assessment Pain Scale: 0-10 Pain Score: 2  Pain Type: Acute pain Pain Location: Ankle ADL ADL Eating: Set up Where Assessed-Eating: Bed level Grooming: Supervision/safety Where Assessed-Grooming: Sitting at sink Upper Body Bathing: Supervision/safety Where Assessed-Upper Body Bathing: Sitting at sink Lower Body Bathing: Minimal assistance Where Assessed-Lower Body Bathing: Bed level Upper Body Dressing: Setup Where Assessed-Upper Body Dressing: Sitting at sink Lower Body Dressing: Minimal assistance Where Assessed-Lower Body Dressing: Bed level Toileting: Minimal assistance Where Assessed-Toileting: Bed level Toilet Transfer: Minimal assistance (sliding board) Toilet Transfer Method: Theatre manager: Drop arm bedside commode Tub/Shower Transfer: Not assessed (pt consistently refusing to train on tub bench transfer) Tub/Shower Transfer Method: Unable to assess Vision Baseline Vision/History: 1 Wears glasses (wears readers, 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) Patient Visual Report: No change from baseline Vision Assessment?: Vision impaired- to be further tested in functional context;Yes Perception  Perception: Impaired Inattention/Neglect: Does not attend to left visual field;Does not attend to right side of body Praxis Praxis: Impaired Praxis Impairment Details: Initiation;Motor planning Cognition Cognition Overall Cognitive Status: History of cognitive impairments - at  baseline Arousal/Alertness: Awake/alert Person: Oriented Place: Oriented Situation: Oriented Memory: Impaired Memory Impairment: Retrieval deficit;Decreased recall of new information;Decreased  short term memory Decreased Short Term Memory: Functional basic;Verbal basic Attention: Focused;Sustained;Selective Focused Attention: Appears intact Sustained Attention: Impaired Sustained Attention Impairment: Verbal basic;Functional basic Selective Attention: Impaired Selective Attention Impairment: Verbal basic;Functional basic Awareness: Impaired Awareness Impairment: Emergent impairment;Intellectual impairment Problem Solving: Impaired Problem Solving Impairment: Verbal basic;Functional basic Executive Function: Reasoning;Decision Making;Initiating Reasoning: Impaired Reasoning Impairment: Functional basic;Verbal basic Sequencing: Impaired Sequencing Impairment: Verbal basic;Functional basic Decision Making: Impaired Decision Making Impairment: Functional basic;Verbal basic Initiating: Impaired Initiating Impairment: Functional basic;Verbal basic Self Correcting: Impaired Self Correcting Impairment: Functional basic;Verbal basic Behaviors: Impulsive (mild) Safety/Judgment: Impaired Brief Interview for Mental Status (BIMS) Repetition of Three Words (First Attempt): 3 Temporal Orientation: Year: Correct Temporal Orientation: Month: Accurate within 5 days Temporal Orientation: Day: Correct Recall: "Sock": No, could not recall Recall: "Blue": No, could not recall Recall: "Bed": No, could not recall BIMS Summary Score: 9 Sensation Sensation Light Touch: Impaired by gross assessment Hot/Cold: Appears Intact Proprioception: Impaired by gross assessment Stereognosis: Not tested Coordination Gross Motor Movements are Fluid and Coordinated: No Fine Motor Movements are Fluid and Coordinated: No Finger Nose Finger Test: Ataxic RUE; WNL LUE Motor  Motor Motor: Hemiplegia Motor -  Skilled Clinical Observations: 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 Mobility  Bed Mobility Bed Mobility: Rolling Right;Rolling Left;Right Sidelying to Sit;Sit to Supine;Supine to Sit Rolling Right: Supervision/verbal cueing Rolling Left: Supervision/Verbal cueing Right Sidelying to Sit: Supervision/Verbal cueing Supine to Sit: Supervision/Verbal cueing Sit to Supine: Supervision/Verbal cueing Transfers Sit to Stand: Minimal Assistance - Patient > 75% (in // bars with PT holding RLE in air to prevent weight bearing) Stand to Sit: Minimal Assistance - Patient > 75%  Trunk/Postural Assessment  Cervical Assessment Cervical Assessment: Exceptions to [facility] (forward head) Thoracic Assessment Thoracic Assessment: Exceptions to [facility] (rounded shoulders) Lumbar Assessment Lumbar Assessment: Exceptions to [facility] (posterior pelvic tilt) Postural Control Postural Control: Deficits on evaluation (delayed)  Balance Balance Balance Assessed: Yes Static Sitting Balance Static Sitting - Balance Support: No upper extremity supported Static Sitting - Level of Assistance: 6: Modified independent (Device/Increase time) Dynamic Sitting Balance Dynamic Sitting - Balance Support: Bilateral upper extremity supported Dynamic Sitting - Level of Assistance: 5: Stand by assistance Static Standing Balance Static Standing - Balance Support: Bilateral upper extremity supported Static Standing - Level of Assistance: 3: Mod assist Dynamic Standing Balance Dynamic Standing - Balance Support: Bilateral upper extremity supported Dynamic Standing - Level of Assistance: 2: Max assist Extremity/Trunk Assessment RUE Assessment RUE Assessment: Exceptions to [facility] RUE Body System: Neuro Brunstrum levels for arm and hand: Arm;Hand (Ataxic) Brunstrum level for arm: Stage IV Movement is deviating from synergy Brunstrum level for hand: Stage V Independence from basic synergies;Stage VI Isolated joint movements LUE  Assessment LUE Assessment: Within Functional Limits   Caryl Asp Marlys Stegmaier 02/17/2022, 12:41 PM

## 2022-02-17 NOTE — Progress Notes (Signed)
Physical Therapy Session Note  Patient Details  Name: [name] Gilbert MRN: [id number] Date of Birth: 1960/10/29  Today's Date: 02/17/2022 PT Individual Time: 1100-1155 PT Individual Time Calculation (min): 55 min   Short Term Goals: Week 2:  PT Short Term Goal 1 (Week 2): STG = LTG due to ELOS  Skilled Therapeutic Interventions/Progress Updates:     Pt supine in bed to start - requires encouragement to participate due to fatigue. Agreeable to go outside and work on w/c mobility and there-ex outdoors. Supine<>sitting EOB with supervision. SB transfer to her L side with totalA for board placement and CGA for safety. Transported outside for time management in her w/c. Outside, completed 2x10 LAQ, 1x5 arm pushups (limited ROM, more isometric for forced RUE use). Also did w/c mobility on unlevel brick pavers which she did well with - supervision assist using BUE to propel 2x178f. Returned upstairs to [employer] and worked on car transfers - required similar assist for board placement and transfer, although did require increased instruction for sequencing due to motor planning deficits while transferring into the car. She also required minA for RLE management in/out of the car. Pt reports that she's not concerned about getting in/out of her car tomorrow with her husband. Setup in // bars to work on standing. She was able to stand with PT only assisting for holding her RLE in the air to prevent weight bearing - able to complete 1x5 reps! Returned to her room and pt requesting to return to bed due to fatigue. SB transfer to bed and bed mobility completed with supervision. All needs met, bed alarm on.   Therapy Documentation Precautions:  Precautions Precautions: Fall Restrictions Weight Bearing Restrictions: Yes RLE Weight Bearing: Non weight bearing General:     Therapy/Group: Individual Therapy  CAlger Simons6/05/2022, 7:33 AM

## 2022-02-17 NOTE — Plan of Care (Signed)
  Problem: Consults Goal: RH STROKE PATIENT EDUCATION Description: See Patient Education module for education specifics  Outcome: Progressing   Problem: RH BOWEL ELIMINATION Goal: RH STG MANAGE BOWEL WITH ASSISTANCE Description: STG Manage Bowel with  mod I Assistance. Outcome: Progressing Goal: RH STG MANAGE BOWEL W/MEDICATION W/ASSISTANCE Description: STG Manage Bowel with Medication with mod I Assistance. Outcome: Progressing   Problem: RH BLADDER ELIMINATION Goal: RH STG MANAGE BLADDER WITH ASSISTANCE Description: STG Manage Bladder With toileting Assistance Outcome: Progressing   Problem: RH SKIN INTEGRITY Goal: RH STG MAINTAIN SKIN INTEGRITY WITH ASSISTANCE Description: STG Maintain Skin Integrity With min Assistance. Outcome: Progressing   Problem: RH SAFETY Goal: RH STG ADHERE TO SAFETY PRECAUTIONS W/ASSISTANCE/DEVICE Description: STG Adhere to Safety Precautions With cues Assistance/Device. Outcome: Progressing   Problem: RH KNOWLEDGE DEFICIT Goal: RH STG INCREASE KNOWLEDGE OF DIABETES Description: Patient and spouse will be able to manage DM with medications and dietary modifications using handouts and educational materials independently Outcome: Progressing Goal: RH STG INCREASE KNOWLEDGE OF HYPERTENSION Description: Patient and spouse will be able to manage HTN with medications and dietary modifications using handouts and educational materials independently Outcome: Progressing Goal: RH STG INCREASE KNOWLEGDE OF HYPERLIPIDEMIA Description: Patient and spouse will be able to manage HLD with medications and dietary modifications using handouts and educational materials independently Outcome: Progressing Goal: RH STG INCREASE KNOWLEDGE OF STROKE PROPHYLAXIS Outcome: Progressing   Problem: Education: Goal: Ability to describe self-care measures that may prevent or decrease complications (Diabetes Survival Skills Education) will improve Outcome: Progressing Goal:  Individualized Educational Video(s) Outcome: Progressing   Problem: Coping: Goal: Ability to adjust to condition or change in health will improve Outcome: Progressing   Problem: Fluid Volume: Goal: Ability to maintain a balanced intake and output will improve Outcome: Progressing   Problem: Health Behavior/Discharge Planning: Goal: Ability to identify and utilize available resources and services will improve Outcome: Progressing Goal: Ability to manage health-related needs will improve Outcome: Progressing   Problem: Metabolic: Goal: Ability to maintain appropriate glucose levels will improve Outcome: Progressing   Problem: Nutritional: Goal: Maintenance of adequate nutrition will improve Outcome: Progressing Goal: Progress toward achieving an optimal weight will improve Outcome: Progressing   Problem: Skin Integrity: Goal: Risk for impaired skin integrity will decrease Outcome: Progressing   Problem: Tissue Perfusion: Goal: Adequacy of tissue perfusion will improve Outcome: Progressing

## 2022-02-17 NOTE — Progress Notes (Signed)
Speech Language Pathology Discharge Summary  Patient Details  Name: [name] Gilbert MRN: [id number] Date of Birth: [birth year]  Today's Date: 02/17/2022 SLP Individual Time: 1003-1100 SLP Individual Time Calculation (min): 57 min  Skilled Therapeutic Interventions:  Pt seen for skilled ST with focus on cognitive goals, pt initially in wheelchair and perseverating on wanting to get in bed. SLP attempting to engaged and distract patient with therapeutic tasks however pt mildly agitated with continued requests to lie down. SLP facilitating transfer from w/c to bed by providing min A cues for safety, sequencing and NWB adherence. Once pt settled in bed, more engaged in activities. SLP facilitating ongoing education on use of compensatory memory and orientation aids at home as well as fall prevention and safety precautions. Pt agreeable but memory impairments impact ability to recall and carryover information, will required 24/7 supervision at home for safety due to documented cognitive impairments. Pt speech and language continues to be slow to initiate at times, encouraged patient to utilize extra time as needed with communication tasks at home to ensure adequate relay of communicative message with family, friends and professionals. Pt was left in bed with alarm set awaiting PT session, cont ST POC.  Patient has met 5 of 5 long term goals.  Patient to discharge at overall Supervision;Min;Mod level.   Clinical Impression/Discharge Summary:   Pt has made slow progress during CIR stay, limited by fatigue, motivation and cognitive impairments, however has met 5 out 5 long term goals. Goals did have to be downgraded during stay due to slow progress, pt is discharging on a Dys 3/thin diet with Supervision A and is Mod A for cognition. Pt expressive/receptive language skills require min A at discharge and husband has received formal education on recommendations for 24/7 supervision, assist with all higher level  cognitive tasks, continued SLP services at next venue and compensatory strategies to increase safety and communication.  Diet handout provided about appropriate Dys 3 textures for home.   Care Partner:  Caregiver Able to Provide Assistance: Yes  Type of Caregiver Assistance: Physical;Cognitive  Recommendation:  Outpatient SLP;24 hour supervision/assistance  Rationale for SLP Follow Up: Maximize swallowing safety;Maximize cognitive function and independence;Maximize functional communication;Reduce caregiver burden   Equipment:     Reasons for discharge: Discharged from hospital   Patient/Family Agrees with Progress Made and Goals Achieved: Yes    Dewaine Conger 02/17/2022, 10:32 AM

## 2022-02-17 NOTE — Progress Notes (Signed)
PROGRESS NOTE   Subjective/Complaints: Planning to go home tomorrow. Denies pain.   ROS:  Pt denies SOB, abd pain, CP, N/V/C/D, No Fever or chills  Objective:   DG Ankle Complete Right  Result Date: 02/16/2022 CLINICAL DATA:  Ankle fracture EXAM: RIGHT ANKLE - COMPLETE 3+ VIEW COMPARISON:  01/26/2022 FINDINGS: Cast material obscures fine bone detail. Bones are osteopenic. Similar appearance of the oblique acute minimally displaced fracture of the right distal fibula. No significant callus formation at this short interval. Visualized tibia, talus and calcaneus appear intact. No joint abnormality. IMPRESSION: Stable acute oblique minimally displaced right distal fibular fracture. Electronically Signed   By: Jerilynn Mages.  Shick M.D.   On: 02/16/2022 16:29   No results for input(s): "WBC", "HGB", "HCT", "PLT" in the last 72 hours.   Recent Labs    02/16/22 0530  NA 137  K 4.2  CL 105  CO2 26  GLUCOSE 283*  BUN 47*  CREATININE 2.41*  CALCIUM 8.7*     Intake/Output Summary (Last 24 hours) at 02/17/2022 1111 Last data filed at 02/17/2022 0700 Gross per 24 hour  Intake 310 ml  Output --  Net 310 ml        Physical Exam: Vital Signs Blood pressure 128/67, pulse 78, temperature 98.4 F (36.9 C), temperature source Oral, resp. rate 18, height 5' 2.01" (1.575 m), weight 76.5 kg, SpO2 100 %.  General: No acute distress Mood and affect are appropriate Heart: Regular rate and rhythm no rubs murmurs or extra sounds Lungs: Clear to auscultation, no rales or wheezes, normal effort Abdomen: Positive bowel sounds, soft nontender to palpation, nondistended Extremities: No clubbing, cyanosis, or edema Skin: warm and dry   Musculoskeletal:        General: Alert, in chair    Cervical back: Neck supple. No tenderness.     Comments: RUE 3-/5 in biceps, triceps, grip and 1/5 in FA LUE- Biceps 2/5; triceps 3-/5; grip 3-/5 and FA 3-/5 RLE- HF  2-/5; KE 1/5; cannot wiggle toes below cast with ACE wrap LLE- HF 2-/5; KE 2-/5; DF 0/5 and PF 2+/5    However spontaneous movement in R side and L side better than tested when reached for remote to use it   Skin:    Comments: RLE cast from toes to just below knee with half cast; half ACE wrap No skin breakdown seen  Neurological:     Comments: Patient is alert.  Disoriented to time. No acute distress.  She does display mild to moderate expressive receptive aphasia. Moderate aphasia noted Decreased to light touch per pt on B/L sides- no difference between both sides  No reading comprehension Poor initiation Max cues for recall Very poor carryover from day to day Psychiatric:     Comments: So extremely flat affect and facies    Assessment/Plan: 1. Functional deficits which require 3+ hours per day of interdisciplinary therapy in a comprehensive inpatient rehab setting. Physiatrist is providing close team supervision and 24 hour management of active medical problems listed below. Physiatrist and rehab team continue to assess barriers to discharge/monitor patient progress toward functional and medical goals  Care Tool:  Bathing    Body  parts bathed by patient: Right arm, Chest, Abdomen, Left arm, Front perineal area, Right upper leg, Left upper leg, Left lower leg, Face   Body parts bathed by helper: Right lower leg, Buttocks     Bathing assist Assist Level: Maximal Assistance - Patient 24 - 49%     Upper Body Dressing/Undressing Upper body dressing   What is the patient wearing?: Pull over shirt    Upper body assist Assist Level: Moderate Assistance - Patient 50 - 74%    Lower Body Dressing/Undressing Lower body dressing      What is the patient wearing?: Pants     Lower body assist Assist for lower body dressing: Maximal Assistance - Patient 25 - 49% (bed level)     Toileting Toileting    Toileting assist Assist for toileting: Dependent - Patient 0%      Transfers Chair/bed transfer  Transfers assist     Chair/bed transfer assist level: Minimal Assistance - Patient > 75% (SB txfr)     Locomotion Ambulation   Ambulation assist   Ambulation activity did not occur: Safety/medical concerns          Walk 10 feet activity   Assist  Walk 10 feet activity did not occur: Safety/medical concerns        Walk 50 feet activity   Assist Walk 50 feet with 2 turns activity did not occur: Safety/medical concerns         Walk 150 feet activity   Assist Walk 150 feet activity did not occur: Safety/medical concerns         Walk 10 feet on uneven surface  activity   Assist Walk 10 feet on uneven surfaces activity did not occur: Safety/medical concerns         Wheelchair     Assist Is the patient using a wheelchair?: Yes Type of Wheelchair: Manual    Wheelchair assist level: Dependent - Patient 0%      Wheelchair 50 feet with 2 turns activity    Assist        Assist Level: Dependent - Patient 0%   Wheelchair 150 feet activity     Assist      Assist Level: Dependent - Patient 0%   Blood pressure 128/67, pulse 78, temperature 98.4 F (36.9 C), temperature source Oral, resp. rate 18, height 5' 2.01" (1.575 m), weight 76.5 kg, SpO2 100 %.    Medical Problem List and Plan: 1. Functional deficits secondary to large acute to subacute infarct posterior medial left temporal and occipital lobe and left thalamus in the setting of multiple intracranial stenosis as well as history of CVA with left-sided weakness/aphasia             -patient may  shower- bag RLE             -ELOS/Goals: 14-16 days- min A to supervision  Continue CIR- PT, OT and SLP  -Plan to go home tomorrow 2.  Impaired mobility and ADLs: goal of bed mobility with Mod A this week. Continue Lovenox.              -antiplatelet therapy: Aspirin 81 mg daily and Plavix 75 mg daily x90 days then aspirin alone 3. Pain: Decrease Lyrica  to 25 mg to HS, discontinue hydrocodone. 4. Mood: Provide emotional support             -antipsychotic agents: N/A 5. Neuropsych: This patient is?capable of making decisions on her own behalf. 6. Skin/Wound Care: Routine skin  checks 7. Fluids/Electrolytes/Nutrition: Routine in and outs with follow-up chemistries 8.  Right distal fibular metaphysis fracture with minimal lateral displacement.  Nonsurgical.  Follow-up orthopedic services Dr.Poggi.  Nonweightbearing right lower extremity Pt not using pain meds, no falls, occ puts a little weight on RLE as per OT but using sling board for transfers , discussed with RN , may need to offer pt tylenol before tharapy  9.  Diabetes mellitus.  Hemoglobin A1c 11.1. Increase Semglee to 13U. Check blood sugars before meals and at bedtime. Recommended choosing low added sugar/high protein foods. Discontinued Novolog sliding scale for hypoglycemia. Changed diet to carb modified. Monitor trend 10.  Seizure prophylaxis.  d/c Keppra 250 mg as has completed 16 day course 11.  Permissive hypertension.  Presently on HCTZ 25 mg daily.  Patient on amlodipine 10 mg daily, hydralazine 25 mg 3 times daily, Coreg 25 mg twice daily, Aldactone 150 mg daily PTA.  Vitals:   02/16/22 1936 02/17/22 0420  BP: (!) 152/77 128/67  Pulse: 79 78  Resp: 16 18  Temp: 98.3 F (36.8 C) 98.4 F (36.9 C)  SpO2: 100% 100%  6/9 BP improved this AM, continue to follow trend 12.  Diastolic congestive heart failure.  Monitor for any signs of fluid overload 13.  AKI on CKD stage III.  Creatinine baseline 1.4-1.7.  Follow-up chemistries. Now up to 2.48. Will start fluids HS.   -Cr 2.41 on 6/8, a little improved from prior, follow with PCP 14.  Hyperlipidemia.  Crestor 15. Constipation- continue colace TID. Add senna 2 tabs HS  -6/9 Large BM yesterday, improved, continue to monitor 16. Obesity: BMI 30.72-->29.87-->29.91-->30.19: provided list of foods to help with weight loss. 17. Low  protein: discussed choosing high protein and low added sugar foods 18. Poor initiation: Amantadine trial did not help, medication d/ced 19. Dysphagia: continue D3 trials    LOS: 15 days A FACE TO FACE EVALUATION WAS PERFORMED  Jennye Boroughs 02/17/2022, 8:45 AM

## 2022-02-17 NOTE — Progress Notes (Signed)
Inpatient Rehabilitation Care Coordinator Discharge Note DC SATE 6/10  Patient Details  Name: Otis Burress MRN: [id number] Date of Birth: [birth year]   Discharge location: HOME WITH HUSBAND WHO CAN PROVIDE 24/7 CARE  Length of Stay: 16 DAYS  Discharge activity level: MIN ASIST WHEELCHAIR LEVEL DUE TO NWB LEG  Home/community participation: ACTIVE  Patient response WL:SLHTDS Literacy - How often do you need to have someone help you when you read instructions, pamphlets, or other written material from your doctor or pharmacy?: Rarely  Patient response KA:JGOTLX Isolation - How often do you feel lonely or isolated from those around you?: Never  Services provided included: MD, RD, PT, OT, SLP, RN, CM, TR, Pharmacy, SW  Financial Services:  Field seismologist Utilized: Scientific laboratory technician MEDICARE  Choices offered to/list presented to: PT AND HUSBAND  Follow-up services arranged:  Home Health, DME, Patient/Family has no preference for HH/DME agencies Home Health Agency: [facility]-PT, OT SP    DME : [facility] BEDSIDE COMMODE  NU MOTION-WHEELCHAIR AND TRANSFER BOARD    Patient response to transportation need: Is the patient able to respond to transportation needs?: Yes In the past 12 months, has lack of transportation kept you from medical appointments or from getting medications?: No In the past 12 months, has lack of transportation kept you from meetings, work, or from getting things needed for daily living?: No    Comments (or additional information):HUSBAND WAS IN FOR HANDS ON FAMILY TRAINING AND IT WENT WELL. CONCERN IS PT WILL DO WHAT SHE USUALLY DOES AND NOT DO ANYTHING ONCE HOME TO ASSIST HERSELF. HUSBAND HAS SPOKEN WITH HER REGARDING THIS ALONG WITH THIS WORKER. SHE IS NON-COMPLIANT WITH HER DIABETES AND THIS WILL PUT HER AT [facility] RISK TO HAVE ANOTHER CVA. RAMP IN PLACE FOR HOME  Patient/Family verbalized understanding of follow-up arrangements:   Yes  Individual responsible for coordination of the follow-up plan: ROBERT-HUSBAND  [phone]  Confirmed correct DME delivered: [name] 02/17/2022    Kortez Murtagh, Lemar Livings

## 2022-02-17 NOTE — Plan of Care (Signed)
Remains incontinent despite toileting

## 2022-02-17 NOTE — Plan of Care (Signed)
  Problem: RH Swallowing Goal: LTG Patient will consume least restrictive diet using compensatory strategies with assistance (SLP) Description: LTG:  Patient will consume least restrictive diet using compensatory strategies with assistance (SLP) Outcome: Completed/Met   Problem: RH Cognition - SLP Goal: RH LTG Patient will demonstrate orientation with cues Description:  LTG:  Patient will demonstrate orientation to person/place/time/situation with cues (SLP)   Outcome: Completed/Met   Problem: RH Comprehension Communication Goal: LTG Patient will comprehend basic/complex auditory (SLP) Description: LTG: Patient will comprehend basic/complex auditory information with cues (SLP). Outcome: Completed/Met   Problem: RH Expression Communication Goal: LTG Patient will verbally express basic/complex needs(SLP) Description: LTG:  Patient will verbally express basic/complex needs, wants or ideas with cues  (SLP) Outcome: Completed/Met Goal: LTG Patient will increase word finding of common (SLP) Description: LTG:  Patient will increase word finding of common objects/daily info/abstract thoughts with cues using compensatory strategies (SLP). Outcome: Completed/Met   Problem: RH Awareness Goal: LTG: Patient will demonstrate awareness during functional activites type of (SLP) Description: LTG: Patient will demonstrate awareness during functional activites type of (SLP) Outcome: Completed/Met

## 2022-02-17 NOTE — Discharge Summary (Signed)
Physical Therapy Discharge Summary  Patient Details  Name: [name] Gilbert MRN: [id number] Date of Birth: Mar 31, 1961  Patient has met 5 of 5 long term goals due to improved activity tolerance, improved balance, improved postural control, increased strength, decreased pain, ability to compensate for deficits, and functional use of  right upper extremity and right lower extremity.  Patient to discharge at an ambulatory level [name] Gilbert.   Patient's care partner is independent to provide the necessary physical and cognitive assistance at discharge.  Reasons goals not met: n/a (goals were downgraded during her stay)  Recommendation:  Patient will benefit from ongoing skilled PT services in home health setting to continue to advance safe functional mobility, address ongoing impairments in bed mobility, functional transfers, w/c mobility and management, caregiver training, home safety, and minimize fall risk.  Equipment: Custom K0005 wheelchair, sliding board  Reasons for discharge: treatment goals met and discharge from hospital  Patient/family agrees with progress made and goals achieved: Yes  PT Discharge Precautions/Restrictions Precautions Precautions: Fall Precaution Comments: R hemi, RLE NWB Restrictions Weight Bearing Restrictions: Yes RLE Weight Bearing: Non weight bearing Pain Pain Assessment Pain Scale: 0-10 Pain Score: 2  Pain Type: Acute pain Pain Location: Ankle Pain Interference Pain Interference Pain Effect on Sleep: 1. Rarely or not at all Pain Interference with Therapy Activities: 1. Rarely or not at all Pain Interference with Day-to-Day Activities: 2. Occasionally Vision/Perception  Vision - History Ability to See in Adequate Light: 1 Impaired Vision - Assessment Ocular Range of Motion: Restricted on the left;Restricted looking up;Restricted looking down;Restricted on the right Tracking/Visual Pursuits: Left eye does not track laterally;Right eye does not  track medially;Decreased smoothness of vertical tracking;Decreased smoothness of horizontal tracking;Unable to hold eye position out of midline;Requires cues, head turns, or add eye shifts to track Saccades: Decreased speed of saccadic movement;Additional eye shifts occurred during testing;Undershoots Perception Perception: Impaired Inattention/Neglect: Does not attend to left visual field;Does not attend to right side of body Praxis Praxis: Impaired Praxis Impairment Details: Initiation;Motor planning  Cognition Overall Cognitive Status: History of cognitive impairments - at baseline Arousal/Alertness: Awake/alert Orientation Level: Oriented to person;Oriented to place;Oriented to situation Year: 2023 Month: June Day of Week: Incorrect Attention: Focused;Sustained;Selective Focused Attention: Appears intact Sustained Attention: Impaired Sustained Attention Impairment: Verbal basic;Functional basic Selective Attention: Impaired Selective Attention Impairment: Verbal basic;Functional basic Memory: Impaired Memory Impairment: Retrieval deficit;Decreased recall of new information;Decreased short term memory Decreased Short Term Memory: Functional basic;Verbal basic Awareness: Impaired Awareness Impairment: Emergent impairment;Intellectual impairment Problem Solving: Impaired Problem Solving Impairment: Verbal basic;Functional basic Executive Function: Reasoning;Decision Making;Initiating Reasoning: Impaired Reasoning Impairment: Functional basic;Verbal basic Sequencing: Impaired Sequencing Impairment: Verbal basic;Functional basic Decision Making: Impaired Decision Making Impairment: Functional basic;Verbal basic Initiating: Impaired Initiating Impairment: Functional basic;Verbal basic Self Correcting: Impaired Self Correcting Impairment: Functional basic;Verbal basic Behaviors: Impulsive (mild) Safety/Judgment: Impaired Sensation Sensation Light Touch: Impaired by gross  assessment Hot/Cold: Appears Intact Proprioception: Impaired by gross assessment Stereognosis: Not tested Coordination Gross Motor Movements are Fluid and Coordinated: No Fine Motor Movements are Fluid and Coordinated: No Finger Nose Finger Test: Ataxic RUE; WNL LUE Motor  Motor Motor: Hemiplegia Motor - Skilled Clinical Observations: 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  Mobility Bed Mobility Bed Mobility: Rolling Right;Rolling Left;Right Sidelying to Sit;Sit to Supine;Supine to Sit Rolling Right: Supervision/verbal cueing Rolling Left: Supervision/Verbal cueing Right Sidelying to Sit: Supervision/Verbal cueing Supine to Sit: Supervision/Verbal cueing Sit to Supine: Supervision/Verbal cueing Transfers Transfers: Lateral/Scoot Transfers;Squat Pivot Transfers;Stand to Sit;Sit to Stand  Sit to Stand: Minimal Assistance - Patient > 75% (in // bars with PT holding RLE in air to prevent weight bearing) Stand to Sit: Minimal Assistance - Patient > 75% Squat Pivot Transfers: Moderate Assistance - Patient 50-74% Lateral/Scoot Transfers: Minimal Assistance - Patient > 75% Transfer (Assistive device): Other (Comment) (sliding board) Locomotion  Gait Ambulation: No Gait Gait: No Stairs / Additional Locomotion Stairs: No Wheelchair Mobility Wheelchair Mobility: Yes Wheelchair Assistance: Chartered loss adjuster: Both upper extremities Wheelchair Parts Management: Needs assistance Distance: 163f  Trunk/Postural Assessment  Cervical Assessment Cervical Assessment: Exceptions to [facility](forward head) Thoracic Assessment Thoracic Assessment: Exceptions to [facility](rounded shoulders) Lumbar Assessment Lumbar Assessment: Exceptions to [facility](posterior pelvic tilt) Postural Control Postural Control: Deficits on evaluation (delayed)  Balance Balance Balance Assessed: Yes Static Sitting Balance Static Sitting - Balance Support: No upper extremity  supported Static Sitting - Level of Assistance: 6: Modified independent (Device/Increase time) Dynamic Sitting Balance Dynamic Sitting - Balance Support: Bilateral upper extremity supported Dynamic Sitting - Level of Assistance: 5: Stand by assistance Static Standing Balance Static Standing - Balance Support: Bilateral upper extremity supported Static Standing - Level of Assistance: 3: Mod assist Dynamic Standing Balance Dynamic Standing - Balance Support: Bilateral upper extremity supported Dynamic Standing - Level of Assistance: 2: Max assist Extremity Assessment  RUE Assessment RUE Assessment: Exceptions to [facility]UE Body System: Neuro Brunstrum levels for arm and hand: Arm;Hand (Ataxic) Brunstrum level for arm: Stage IV Movement is deviating from synergy Brunstrum level for hand: Stage V Independence from basic synergies;Stage VI Isolated joint movements LUE Assessment LUE Assessment: Within Functional Limits RLE Assessment RLE Assessment: Exceptions to [facility] Strength Comments: Casted from knee to toes. Knee ext 3+/5, hip flex 2-/5 LLE Assessment LLE Assessment: Exceptions to [facility] Strength Comments: Grossly 3+/5   [name] Gilbert P [name] Gilbert PT 02/17/2022, 12:22 PM

## 2022-02-18 DIAGNOSIS — E669 Obesity, unspecified: Secondary | ICD-10-CM

## 2022-02-18 LAB — GLUCOSE, CAPILLARY: Glucose-Capillary: 280 mg/dL — ABNORMAL HIGH (ref 70–99)

## 2022-02-18 NOTE — Progress Notes (Signed)
Inpatient Rehabilitation Discharge Medication Review by a Pharmacist  A complete drug regimen review was completed for this patient to identify any potential clinically significant medication issues.  High Risk Drug Classes Is patient taking? Indication by Medication  Antipsychotic No   Anticoagulant No   Antibiotic No   Opioid No   Antiplatelet Yes Aspirin, clopidogrel for CVA ppx  Hypoglycemics/insulin Yes Insulin glargine for DM  Vasoactive Medication Yes HCTZ for hypertension  Chemotherapy No   Other Yes Pregabalin for neuropathic pain Rosuvastatin for hyperlipidemia     Type of Medication Issue Identified Description of Issue Recommendation(s)  Drug Interaction(s) (clinically significant)     Duplicate Therapy     Allergy     No Medication Administration End Date     Incorrect Dose     Additional Drug Therapy Needed     Significant med changes from prior encounter (inform family/care partners about these prior to discharge).    Other       Time spent performing this drug regimen review (minutes):  20   [name] Gilbert 02/18/2022 7:54 AM

## 2022-02-18 NOTE — Progress Notes (Signed)
PROGRESS NOTE   Subjective/Complaints: A little restless last night. No particular thing caused it. Ready to go home today!  ROS: Limited due to cognitive/behavioral   Objective:   DG Ankle Complete Right  Result Date: 02/16/2022 CLINICAL DATA:  Ankle fracture EXAM: RIGHT ANKLE - COMPLETE 3+ VIEW COMPARISON:  01/26/2022 FINDINGS: Cast material obscures fine bone detail. Bones are osteopenic. Similar appearance of the oblique acute minimally displaced fracture of the right distal fibula. No significant callus formation at this short interval. Visualized tibia, talus and calcaneus appear intact. No joint abnormality. IMPRESSION: Stable acute oblique minimally displaced right distal fibular fracture. Electronically Signed   By: Jerilynn Mages.  Shick M.D.   On: 02/16/2022 16:29   No results for input(s): "WBC", "HGB", "HCT", "PLT" in the last 72 hours.   Recent Labs    02/16/22 0530  NA 137  K 4.2  CL 105  CO2 26  GLUCOSE 283*  BUN 47*  CREATININE 2.41*  CALCIUM 8.7*     Intake/Output Summary (Last 24 hours) at 02/18/2022 0647 Last data filed at 02/17/2022 1900 Gross per 24 hour  Intake 230 ml  Output --  Net 230 ml        Physical Exam: Vital Signs Blood pressure (!) 151/88, pulse 91, temperature 98.8 F (37.1 C), temperature source Oral, resp. rate 17, height 5' 2.01" (1.575 m), weight 76.5 kg, SpO2 98 %.  General: No acute distress Constitutional: No distress . Vital signs reviewed. HEENT: NCAT, EOMI, oral membranes moist Neck: supple Cardiovascular: RRR without murmur. No JVD    Respiratory/Chest: CTA Bilaterally without wheezes or rales. Normal effort    GI/Abdomen: BS +, non-tender, non-distended Ext: no clubbing, cyanosis, or edema Psych: flat   Musculoskeletal:        General: Alert, in chair    Cervical back: Neck supple. No tenderness.     Comments: RUE 3-/5 in biceps, triceps, grip and 1/5 in FA LUE- Biceps 2/5;  triceps 3-/5; grip 3-/5 and FA 3-/5 RLE- HF 2-/5; KE 1/5; cannot wiggle toes below cast with ACE wrap LLE- HF 2-/5; KE 2-/5; DF 0/5 and PF 2+/5---inconsistent       Skin:    Comments: RLE cast from toes to just below knee with half cast; half ACE wrap No skin breakdown seen  Neurological:     Comments: Patient is alert.  Disoriented to time. No acute distress.  She does display mild to moderate expressive receptive aphasia but able to express thoughts. flat Decreased to light touch per pt on B/L sides- no difference between both sides      Assessment/Plan: 1. Functional deficits which require 3+ hours per day of interdisciplinary therapy in a comprehensive inpatient rehab setting. Physiatrist is providing close team supervision and 24 hour management of active medical problems listed below. Physiatrist and rehab team continue to assess barriers to discharge/monitor patient progress toward functional and medical goals  Care Tool:  Bathing    Body parts bathed by patient: Right arm, Chest, Abdomen, Left arm, Front perineal area, Right upper leg, Left upper leg, Left lower leg, Face   Body parts bathed by helper: Right lower leg, Buttocks  Bathing assist Assist Level: Minimal Assistance - Patient > 75%     Upper Body Dressing/Undressing Upper body dressing   What is the patient wearing?: Pull over shirt    Upper body assist Assist Level: Set up assist    Lower Body Dressing/Undressing Lower body dressing      What is the patient wearing?: Pants     Lower body assist Assist for lower body dressing: Minimal Assistance - Patient > 75%     Toileting Toileting    Toileting assist Assist for toileting: Minimal Assistance - Patient > 75%     Transfers Chair/bed transfer  Transfers assist     Chair/bed transfer assist level: Minimal Assistance - Patient > 75%     Locomotion Ambulation   Ambulation assist   Ambulation activity did not occur: Safety/medical  concerns (unable to comply with NWB restrictions)          Walk 10 feet activity   Assist  Walk 10 feet activity did not occur: Safety/medical concerns        Walk 50 feet activity   Assist Walk 50 feet with 2 turns activity did not occur: Safety/medical concerns         Walk 150 feet activity   Assist Walk 150 feet activity did not occur: Safety/medical concerns         Walk 10 feet on uneven surface  activity   Assist Walk 10 feet on uneven surfaces activity did not occur: Safety/medical concerns         Wheelchair     Assist Is the patient using a wheelchair?: Yes Type of Wheelchair: Manual    Wheelchair assist level: Supervision/Verbal cueing Max wheelchair distance: 14ft    Wheelchair 50 feet with 2 turns activity    Assist        Assist Level: Supervision/Verbal cueing   Wheelchair 150 feet activity     Assist  Wheelchair 150 feet activity did not occur: Safety/medical concerns (fatigue)   Assist Level: Dependent - Patient 0%   Blood pressure (!) 151/88, pulse 91, temperature 98.8 F (37.1 C), temperature source Oral, resp. rate 17, height 5' 2.01" (1.575 m), weight 76.5 kg, SpO2 98 %.    Medical Problem List and Plan: 1. Functional deficits secondary to large acute to subacute infarct posterior medial left temporal and occipital lobe and left thalamus in the setting of multiple intracranial stenosis as well as history of CVA with left-sided weakness/aphasia             -dc home today  -f/u with [facility], neurology, ortho, primary 2.  Impaired mobility and ADLs: goal of bed mobility with Mod A this week. Continue Lovenox.              -antiplatelet therapy: Aspirin 81 mg daily and Plavix 75 mg daily x90 days then aspirin alone 3. Pain: Decrease Lyrica to 25 mg to HS, discontinue hydrocodone. 4. Mood: Provide emotional support             -antipsychotic agents: N/A 5. Neuropsych: This patient is?capable of making decisions  on her own behalf. 6. Skin/Wound Care: Routine skin checks 7. Fluids/Electrolytes/Nutrition: Routine in and outs with follow-up chemistries 8.  Right distal fibular metaphysis fracture with minimal lateral displacement.  Nonsurgical.  Follow-up orthopedic services Dr.Poggi.  Nonweightbearing right lower extremity Pt not using pain meds, no falls, occ puts a little weight on RLE as per OT but using sling board for transfers , discussed with RN ,  may need to offer pt tylenol before tharapy  9.  Diabetes mellitus.  Hemoglobin A1c 11.1. Increase Semglee to 13U. Check blood sugars before meals and at bedtime. Recommended choosing low added sugar/high protein foods. Discontinued Novolog sliding scale for hypoglycemia. Changed diet to carb modified. F/u as outpt 10.  Seizure prophylaxis.  d/c Keppra 250 mg as has completed 16 day course 11.  Permissive hypertension.  Presently on HCTZ 25 mg daily.  Patient on amlodipine 10 mg daily, hydralazine 25 mg 3 times daily, Coreg 25 mg twice daily, Aldactone 150 mg daily PTA.  Vitals:   02/17/22 2130 02/18/22 0458  BP: (!) 157/87 (!) 151/88  Pulse: 93 91  Resp:  17  Temp:  98.8 F (37.1 C)  SpO2:  98%  6/10 BP borderline, will need f/u as outpt 12.  Diastolic congestive heart failure.  Monitor for any signs of fluid overload 13.  AKI on CKD stage III.  Creatinine baseline 1.4-1.7.  Follow-up chemistries. Now up to 2.48. Will start fluids HS.   -Cr 2.41 on 6/8, a little improved from prior, follow with PCP 14.  Hyperlipidemia.  Crestor 15. Constipation- continue colace TID. Add senna 2 tabs HS  -6/10 moving bowels 16. Obesity: BMI 30.72-->29.87-->29.91-->30.19: provided list of foods to help with weight loss. 17. Low protein: discussed choosing high protein and low added sugar foods 18. Poor initiation: Amantadine trial did not help, medication d/ced 19. Dysphagia: continue D3 trials    LOS: 16 days A FACE TO FACE EVALUATION WAS PERFORMED  Meredith Staggers 02/18/2022, 6:47 AM

## 2022-02-18 NOTE — Progress Notes (Signed)
Patient discharged to home accompanied by her spouse. 

## 2022-02-20 ENCOUNTER — Telehealth: Payer: Self-pay

## 2022-02-20 NOTE — Telephone Encounter (Signed)
SW covering for primary SW, Becky Dupree.   SW received phone call from pt dtr who reported concerns about pt father not being able to provide care to her mother. Asked about alternative options. SW shared P[employer] consultant and/or considering SNF. She placed her father on phone with SW to disucss above. SW also informed will ask HH to add SW to see if they can assist with alternate options if they consider SNF. Husabnd intends to explore other options.   SW sent added HHSW and HHSNA to order- order to sent [facility].   [name] Gilbert, MSW, LCSWA Office: [phone] Cell: [phone] Fax: [phone]

## 2022-02-23 ENCOUNTER — Telehealth: Payer: Self-pay | Admitting: *Deleted

## 2022-02-23 NOTE — Telephone Encounter (Signed)
[name] Gilbert PT from [facility] called to report that he went out to start care for [name] Gilbert and she is non compliant with all her medication, will not follow recommendations of therapist, refused SN visit and is generally non compliant in all matters. Her husband is struggling with the use of the hoyer lift and he is recommending SNF placement. They will not be admitting to [facility] services due to the non compliance.

## 2022-03-13 ENCOUNTER — Encounter: Payer: Medicare HMO | Admitting: Physical Medicine & Rehabilitation

## 2022-05-23 ENCOUNTER — Other Ambulatory Visit: Payer: Self-pay | Admitting: Orthopedic Surgery

## 2022-05-23 DIAGNOSIS — S82831D Other fracture of upper and lower end of right fibula, subsequent encounter for closed fracture with routine healing: Secondary | ICD-10-CM

## 2022-06-01 ENCOUNTER — Ambulatory Visit
Admission: RE | Admit: 2022-06-01 | Discharge: 2022-06-01 | Disposition: A | Discharge status: Home / Self Care | Payer: Medicare HMO | Source: Ambulatory Visit | Attending: Orthopedic Surgery | Admitting: Orthopedic Surgery

## 2022-06-01 DIAGNOSIS — S82831D Other fracture of upper and lower end of right fibula, subsequent encounter for closed fracture with routine healing: Secondary | ICD-10-CM

## 2022-10-14 ENCOUNTER — Encounter (HOSPITAL_COMMUNITY): Payer: Self-pay

## 2022-10-14 ENCOUNTER — Inpatient Hospital Stay (HOSPITAL_COMMUNITY)
Admission: RE | Admit: 2022-10-14 | Discharge: 2022-10-25 | DRG: 064 | Disposition: A | Discharge status: Skilled Nursing Facility | Payer: Medicare HMO | Source: Other Acute Inpatient Hospital | Attending: Internal Medicine | Admitting: Internal Medicine

## 2022-10-14 ENCOUNTER — Emergency Department: Payer: Medicare HMO

## 2022-10-14 ENCOUNTER — Emergency Department
Admission: EM | Admit: 2022-10-14 | Discharge: 2022-10-14 | Disposition: A | Discharge status: Other Acute Inpatient Hospital | Payer: Medicare HMO | Attending: Emergency Medicine | Admitting: Emergency Medicine

## 2022-10-14 ENCOUNTER — Other Ambulatory Visit: Payer: Self-pay

## 2022-10-14 ENCOUNTER — Inpatient Hospital Stay (HOSPITAL_COMMUNITY): Payer: Medicare HMO

## 2022-10-14 DIAGNOSIS — I613 Nontraumatic intracerebral hemorrhage in brain stem: Principal | ICD-10-CM | POA: Diagnosis present

## 2022-10-14 DIAGNOSIS — I6381 Other cerebral infarction due to occlusion or stenosis of small artery: Secondary | ICD-10-CM | POA: Diagnosis present

## 2022-10-14 DIAGNOSIS — I6622 Occlusion and stenosis of left posterior cerebral artery: Secondary | ICD-10-CM | POA: Diagnosis present

## 2022-10-14 DIAGNOSIS — Z79899 Other long term (current) drug therapy: Secondary | ICD-10-CM

## 2022-10-14 DIAGNOSIS — Z888 Allergy status to other drugs, medicaments and biological substances status: Secondary | ICD-10-CM | POA: Diagnosis not present

## 2022-10-14 DIAGNOSIS — R4701 Aphasia: Secondary | ICD-10-CM | POA: Diagnosis present

## 2022-10-14 DIAGNOSIS — E119 Type 2 diabetes mellitus without complications: Secondary | ICD-10-CM | POA: Diagnosis not present

## 2022-10-14 DIAGNOSIS — E1165 Type 2 diabetes mellitus with hyperglycemia: Secondary | ICD-10-CM | POA: Diagnosis not present

## 2022-10-14 DIAGNOSIS — I129 Hypertensive chronic kidney disease with stage 1 through stage 4 chronic kidney disease, or unspecified chronic kidney disease: Secondary | ICD-10-CM | POA: Diagnosis present

## 2022-10-14 DIAGNOSIS — R109 Unspecified abdominal pain: Secondary | ICD-10-CM | POA: Insufficient documentation

## 2022-10-14 DIAGNOSIS — I639 Cerebral infarction, unspecified: Secondary | ICD-10-CM | POA: Diagnosis not present

## 2022-10-14 DIAGNOSIS — H547 Unspecified visual loss: Secondary | ICD-10-CM | POA: Diagnosis present

## 2022-10-14 DIAGNOSIS — N184 Chronic kidney disease, stage 4 (severe): Secondary | ICD-10-CM | POA: Diagnosis present

## 2022-10-14 DIAGNOSIS — Z91148 Patient's other noncompliance with medication regimen for other reason: Secondary | ICD-10-CM | POA: Diagnosis not present

## 2022-10-14 DIAGNOSIS — G8191 Hemiplegia, unspecified affecting right dominant side: Secondary | ICD-10-CM | POA: Diagnosis present

## 2022-10-14 DIAGNOSIS — Z833 Family history of diabetes mellitus: Secondary | ICD-10-CM | POA: Diagnosis not present

## 2022-10-14 DIAGNOSIS — Z7982 Long term (current) use of aspirin: Secondary | ICD-10-CM

## 2022-10-14 DIAGNOSIS — I619 Nontraumatic intracerebral hemorrhage, unspecified: Secondary | ICD-10-CM | POA: Insufficient documentation

## 2022-10-14 DIAGNOSIS — Z87440 Personal history of urinary (tract) infections: Secondary | ICD-10-CM

## 2022-10-14 DIAGNOSIS — Z880 Allergy status to penicillin: Secondary | ICD-10-CM

## 2022-10-14 DIAGNOSIS — E785 Hyperlipidemia, unspecified: Secondary | ICD-10-CM | POA: Diagnosis present

## 2022-10-14 DIAGNOSIS — Z8673 Personal history of transient ischemic attack (TIA), and cerebral infarction without residual deficits: Secondary | ICD-10-CM | POA: Diagnosis not present

## 2022-10-14 DIAGNOSIS — I10 Essential (primary) hypertension: Secondary | ICD-10-CM | POA: Diagnosis not present

## 2022-10-14 DIAGNOSIS — G40909 Epilepsy, unspecified, not intractable, without status epilepticus: Secondary | ICD-10-CM | POA: Diagnosis present

## 2022-10-14 DIAGNOSIS — Z66 Do not resuscitate: Secondary | ICD-10-CM | POA: Diagnosis present

## 2022-10-14 DIAGNOSIS — I6389 Other cerebral infarction: Secondary | ICD-10-CM | POA: Diagnosis not present

## 2022-10-14 DIAGNOSIS — Z91199 Patient's noncompliance with other medical treatment and regimen due to unspecified reason: Secondary | ICD-10-CM | POA: Diagnosis not present

## 2022-10-14 DIAGNOSIS — E1122 Type 2 diabetes mellitus with diabetic chronic kidney disease: Secondary | ICD-10-CM | POA: Diagnosis present

## 2022-10-14 DIAGNOSIS — R531 Weakness: Secondary | ICD-10-CM | POA: Diagnosis present

## 2022-10-14 DIAGNOSIS — Z7401 Bed confinement status: Secondary | ICD-10-CM

## 2022-10-14 DIAGNOSIS — Z794 Long term (current) use of insulin: Secondary | ICD-10-CM | POA: Diagnosis not present

## 2022-10-14 DIAGNOSIS — I161 Hypertensive emergency: Secondary | ICD-10-CM | POA: Diagnosis not present

## 2022-10-14 DIAGNOSIS — R29708 NIHSS score 8: Secondary | ICD-10-CM | POA: Diagnosis present

## 2022-10-14 DIAGNOSIS — I6523 Occlusion and stenosis of bilateral carotid arteries: Secondary | ICD-10-CM | POA: Diagnosis not present

## 2022-10-14 LAB — CBC WITH DIFFERENTIAL/PLATELET
Abs Immature Granulocytes: 0.01 10*3/uL (ref 0.00–0.07)
Basophils Absolute: 0 10*3/uL (ref 0.0–0.1)
Basophils Relative: 0 %
Eosinophils Absolute: 0.1 10*3/uL (ref 0.0–0.5)
Eosinophils Relative: 1 %
HCT: 40.2 % (ref 36.0–46.0)
Hemoglobin: 13.2 g/dL (ref 12.0–15.0)
Immature Granulocytes: 0 %
Lymphocytes Relative: 34 %
Lymphs Abs: 1.9 10*3/uL (ref 0.7–4.0)
MCH: 28.4 pg (ref 26.0–34.0)
MCHC: 32.8 g/dL (ref 30.0–36.0)
MCV: 86.6 fL (ref 80.0–100.0)
Monocytes Absolute: 0.3 10*3/uL (ref 0.1–1.0)
Monocytes Relative: 6 %
Neutro Abs: 3.3 10*3/uL (ref 1.7–7.7)
Neutrophils Relative %: 59 %
Platelets: 291 10*3/uL (ref 150–400)
RBC: 4.64 MIL/uL (ref 3.87–5.11)
RDW: 15.4 % (ref 11.5–15.5)
WBC: 5.6 10*3/uL (ref 4.0–10.5)
nRBC: 0 % (ref 0.0–0.2)

## 2022-10-14 LAB — COMPREHENSIVE METABOLIC PANEL
ALT: 10 U/L (ref 0–44)
AST: 20 U/L (ref 15–41)
Albumin: 3.4 g/dL — ABNORMAL LOW (ref 3.5–5.0)
Alkaline Phosphatase: 84 U/L (ref 38–126)
Anion gap: 11 (ref 5–15)
BUN: 32 mg/dL — ABNORMAL HIGH (ref 8–23)
CO2: 23 mmol/L (ref 22–32)
Calcium: 8.7 mg/dL — ABNORMAL LOW (ref 8.9–10.3)
Chloride: 103 mmol/L (ref 98–111)
Creatinine, Ser: 1.89 mg/dL — ABNORMAL HIGH (ref 0.44–1.00)
GFR, Estimated: 29 mL/min — ABNORMAL LOW (ref >60)
Glucose, Bld: 258 mg/dL — ABNORMAL HIGH (ref 70–99)
Potassium: 4.5 mmol/L (ref 3.5–5.1)
Sodium: 137 mmol/L (ref 135–145)
Total Bilirubin: 1.5 mg/dL — ABNORMAL HIGH (ref 0.3–1.2)
Total Protein: 9.2 g/dL — ABNORMAL HIGH (ref 6.5–8.1)

## 2022-10-14 LAB — ETHANOL: Alcohol, Ethyl (B): <10 mg/dL (ref <10)

## 2022-10-14 LAB — TROPONIN I (HIGH SENSITIVITY): Troponin I (High Sensitivity): 14 ng/L (ref <18)

## 2022-10-14 LAB — BRAIN NATRIURETIC PEPTIDE: B Natriuretic Peptide: 28.2 pg/mL (ref 0.0–100.0)

## 2022-10-14 MED ORDER — CLEVIDIPINE BUTYRATE 0.5 MG/ML IV EMUL
0.0000 mg/h | INTRAVENOUS | Status: DC
  Administered 2022-10-14: 2 mg/h via INTRAVENOUS
  Administered 2022-10-14: 17 mg/h via INTRAVENOUS
  Filled 2022-10-14 (×3): qty 50

## 2022-10-14 MED ORDER — ACETAMINOPHEN 325 MG PO TABS
650.0000 mg | ORAL_TABLET | ORAL | Status: DC | PRN
  Administered 2022-10-14: 650 mg via ORAL
  Filled 2022-10-14 (×2): qty 2

## 2022-10-14 MED ORDER — INSULIN ASPART 100 UNIT/ML IJ SOLN
0.0000 [IU] | Freq: Every day | INTRAMUSCULAR | Status: DC
  Administered 2022-10-15: 2 [IU] via SUBCUTANEOUS
  Administered 2022-10-15: 4 [IU] via SUBCUTANEOUS
  Administered 2022-10-16: 2 [IU] via SUBCUTANEOUS

## 2022-10-14 MED ORDER — ACETAMINOPHEN 650 MG RE SUPP: 650.0000 mg | RECTAL | Status: DC | PRN

## 2022-10-14 MED ORDER — PANTOPRAZOLE SODIUM 40 MG IV SOLR
40.0000 mg | Freq: Every day | INTRAVENOUS | Status: DC
  Administered 2022-10-14 – 2022-10-16 (×3): 40 mg via INTRAVENOUS
  Filled 2022-10-14 (×4): qty 10

## 2022-10-14 MED ORDER — CLEVIDIPINE BUTYRATE 0.5 MG/ML IV EMUL
INTRAVENOUS | Status: AC
  Administered 2022-10-14: 15 mg/h via INTRAVENOUS
  Filled 2022-10-14: qty 50

## 2022-10-14 MED ORDER — LABETALOL HCL 5 MG/ML IV SOLN
10.0000 mg | INTRAVENOUS | Status: DC | PRN
  Administered 2022-10-14 – 2022-10-16 (×7): 10 mg via INTRAVENOUS
  Filled 2022-10-14 (×8): qty 4

## 2022-10-14 MED ORDER — CLEVIDIPINE BUTYRATE 0.5 MG/ML IV EMUL
0.0000 mg/h | INTRAVENOUS | Status: DC
  Administered 2022-10-15: 2 mg/h via INTRAVENOUS
  Administered 2022-10-16: 10 mg/h via INTRAVENOUS
  Filled 2022-10-14 (×3): qty 50
  Filled 2022-10-14 (×2): qty 100

## 2022-10-14 MED ORDER — BUSPIRONE HCL 10 MG PO TABS
5.0000 mg | ORAL_TABLET | Freq: Once | ORAL | Status: DC
  Filled 2022-10-14: qty 1

## 2022-10-14 MED ORDER — STROKE: EARLY STAGES OF RECOVERY BOOK
Freq: Once | Status: AC
  Filled 2022-10-14: qty 1

## 2022-10-14 MED ORDER — INSULIN ASPART 100 UNIT/ML IJ SOLN
0.0000 [IU] | Freq: Three times a day (TID) | INTRAMUSCULAR | Status: DC
  Administered 2022-10-15: 1 [IU] via SUBCUTANEOUS
  Administered 2022-10-15: 3 [IU] via SUBCUTANEOUS
  Administered 2022-10-15: 2 [IU] via SUBCUTANEOUS
  Administered 2022-10-16: 7 [IU] via SUBCUTANEOUS
  Administered 2022-10-16: 1 [IU] via SUBCUTANEOUS
  Administered 2022-10-17 – 2022-10-18 (×2): 2 [IU] via SUBCUTANEOUS
  Administered 2022-10-18: 1 [IU] via SUBCUTANEOUS
  Administered 2022-10-19 – 2022-10-21 (×4): 2 [IU] via SUBCUTANEOUS
  Administered 2022-10-21 – 2022-10-22 (×3): 3 [IU] via SUBCUTANEOUS
  Administered 2022-10-22: 2 [IU] via SUBCUTANEOUS
  Administered 2022-10-22: 1 [IU] via SUBCUTANEOUS
  Administered 2022-10-23: 7 [IU] via SUBCUTANEOUS
  Administered 2022-10-23: 3 [IU] via SUBCUTANEOUS
  Administered 2022-10-24 (×2): 2 [IU] via SUBCUTANEOUS
  Administered 2022-10-24: 5 [IU] via SUBCUTANEOUS
  Administered 2022-10-25: 7 [IU] via SUBCUTANEOUS

## 2022-10-14 MED ORDER — ACETAMINOPHEN 160 MG/5ML PO SOLN: 650.0000 mg | ORAL | Status: DC | PRN

## 2022-10-14 MED ORDER — SENNOSIDES-DOCUSATE SODIUM 8.6-50 MG PO TABS
1.0000 | ORAL_TABLET | Freq: Two times a day (BID) | ORAL | Status: DC
  Administered 2022-10-14 – 2022-10-25 (×14): 1 via ORAL
  Filled 2022-10-14 (×20): qty 1

## 2022-10-14 NOTE — ED Notes (Signed)
Pt going to CT

## 2022-10-14 NOTE — Consult Note (Signed)
NEUROLOGY CONSULTATION NOTE   Date of service: October 14, 2022 Patient Name: [name] Gilbert MRN:  [id number] DOB:  Sep 09, 1961 Reason for consult: pontine hemorrhage Requesting physician: Dr. Duffy Bruce _ _ _   _ __   _ __ _ _  __ __   _ __   __ _  History of Present Illness   62 yo woman with hx DM2, multiple prior strokes, uncontrolled HTN noncompliant with prescribed medications who is BIB EMS for increasing weakness over the past few days and abdominal pain. At baseline doesn't speak much and is nonambulatory 2/2 hx prior strokes and recent foot injury. She has significant memory impairment at baseline as well. Husband called EMS bc over the past 24 hrs she has been weaker than usual (diffusely) and more lethargic. She also complained of abdominal pain. She had a recent UTI and took antibiotics for that but does not take her prescribed BP medications. 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 There is e/o multiple prior strokes including new ischemic R BG stroke since last scan 01/27/22 but does not appear acute. BP 240s/120s. There was a delay in BP mgmt 2/2 difficutly obtaining IV access. EDP was able to place an IV with ultrasound and patient was started on clevidipine. She is not on anticoagulation.  CT abd/pelvis with no acute findings to explain abdominal pain  History obtained primarily from husband who is also a poor historian   ROS   UTA 2/2 mental status  Past History   I have reviewed the following:  Past Medical History:  Diagnosis Date   Diabetes mellitus without complication ([place])    Hypertension    Stroke ([place])    visual impaired    Past Surgical History:  Procedure Laterality Date   ABDOMINAL HYSTERECTOMY     TONSILLECTOMY     Family History  Problem Relation Age of Onset   Diabetes Father    Social History   Socioeconomic History   Marital status: Married    Spouse name: Not on file   Number of children: Not on file   Years of  education: Not on file   Highest education level: Not on file  Occupational History   Occupation: home maker  Tobacco Use   Smoking status: Never    Passive exposure: Never   Smokeless tobacco: Never  Vaping Use   Vaping Use: Never used  Substance and Sexual Activity   Alcohol use: No   Drug use: No   Sexual activity: Not Currently  Other Topics Concern   Not on file  Social History Narrative   Not on file   Social Determinants of Health   Financial Resource Strain: Not on file  Food Insecurity: Not on file  Transportation Needs: Not on file  Physical Activity: Not on file  Stress: Not on file  Social Connections: Not on file   Allergies  Allergen Reactions   Metformin Other (See Comments)    Reaction: unknown   Penicillins Hives, Nausea And Vomiting and Swelling    Has patient had a PCN reaction causing immediate rash, facial/tongue/throat swelling, SOB or lightheadedness with hypotension: Yes Has patient had a PCN reaction causing severe rash involving mucus membranes or skin necrosis: No Has patient had a PCN reaction that required hospitalization Yes Has patient had a PCN reaction occurring within the last 10 years: No If all of the above answers are "NO", then may proceed with Cephalosporin use.    Codeine Other (See Comments)  Reaction: HIVES   Hydralazine     Stomach pain   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    Medications   (Not in a hospital admission)     Current Facility-Administered Medications:    clevidipine (CLEVIPREX) infusion 0.5 mg/mL, 0-21 mg/hr, Intravenous, Continuous, Duffy Bruce, MD, Last Rate: 8 mL/hr at 10/14/22 1455, 4 mg/hr at 10/14/22 1455  Current Outpatient Medications:    acetaminophen (TYLENOL) 325 MG tablet, Take 2 tablets (650 mg total) by mouth every 4 (four) hours as needed for mild pain (or temp > 37.5 C (99.5 F))., Disp: , Rfl:    aspirin 81 MG chewable tablet, Chew 81 mg by mouth every morning.  (Patient not taking: Reported on 01/26/2022), Disp: , Rfl:    B Complex-C (B-COMPLEX WITH VITAMIN C) tablet, Take 1 tablet by mouth daily., Disp: 30 tablet, Rfl: 0   clopidogrel (PLAVIX) 75 MG tablet, Take 1 tablet (75 mg total) by mouth daily., Disp: 90 tablet, Rfl: 0   docusate sodium (COLACE) 100 MG capsule, Take 1 capsule (100 mg total) by mouth 2 (two) times daily., Disp: 10 capsule, Rfl: 0   hydrochlorothiazide (HYDRODIURIL) 25 MG tablet, Take 1 tablet (25 mg total) by mouth daily., Disp: 30 tablet, Rfl: 0   insulin glargine (LANTUS) 100 UNIT/ML Solostar Pen, Inject 13 Units into the skin daily., Disp: 15 mL, Rfl: 11   pregabalin (LYRICA) 25 MG capsule, Take 1 capsule (25 mg total) by mouth at bedtime., Disp: 30 capsule, Rfl: 0   rosuvastatin (CRESTOR) 40 MG tablet, Take 1 tablet (40 mg total) by mouth at bedtime., Disp: 30 tablet, Rfl: 0  Vitals   Vitals:   10/14/22 1240 10/14/22 1330 10/14/22 1447 10/14/22 1454  BP:  (!) 226/205 (!) 234/126 (!) 216/118  Pulse:  (!) 107 (!) 120 (!) 113  Resp:  12 17 20   Temp:      TempSrc:      SpO2:  100% 100% 100%  Weight: 70.9 kg     Height: 5\' 2"  (1.575 m)        Body mass index is 28.59 kg/m.  Physical Exam   Physical Exam Gen: lethargic, oriented to self, husband, and age but not location or time Resp: normal WOB CV: tachycardic and regular  Neuro: *MS: lethargic, oriented to self, husband, and age but not location or time. Difficulty following simple commands *Speech: mild dysarthria, does not name or repeat *CN: PERRL, blinks to threat on L side only, EOMI, face symmetric at rest, hearing intact to voice *Motor: Drift but not to bed in all extremities *Sensory: SILT *Reflexes:  1+ and symmetric throughout without clonus; toes mute bilat *Gait: UTA  NIHSS  1a Level of Conscious.: 1 1b LOC Questions: 1 1c LOC Commands: 1 2 Best Gaze: 0 3 Visual: 2 4 Facial Palsy: 0 5a Motor Arm - left: 1 5b Motor Arm - Right: 1 6a Motor  Leg - Left: 1 6b Motor Leg - Right: 1 7 Limb Ataxia: 0 8 Sensory: 0 9 Best Language: 2 10 Dysarthria: 1 11 Extinct. and Inatten.: 0  TOTAL:  12   Premorbid mRS = 4  ICH score = 0   Labs   CBC: No results for input(s): "WBC", "NEUTROABS", "HGB", "HCT", "MCV", "PLT" in the last 168 hours.  Basic Metabolic Panel:  Lab Results  Component Value Date   NA 137 02/16/2022   K 4.2 02/16/2022   CO2 26 02/16/2022  GLUCOSE 283 (H) 02/16/2022   BUN 47 (H) 02/16/2022   CREATININE 2.41 (H) 02/16/2022   CALCIUM 8.7 (L) 02/16/2022   GFRNONAA 22 (L) 02/16/2022   GFRAA >60 10/05/2019   Lipid Panel:  Lab Results  Component Value Date   LDLCALC 214 (H) 01/26/2022   HgbA1c:  Lab Results  Component Value Date   HGBA1C 11.1 (H) 01/25/2022   Urine Drug Screen:     Component Value Date/Time   LABOPIA NONE DETECTED 01/25/2022 0650   COCAINSCRNUR NONE DETECTED 01/25/2022 0650   LABBENZ NONE DETECTED 01/25/2022 0650   AMPHETMU NONE DETECTED 01/25/2022 0650   THCU NONE DETECTED 01/25/2022 0650   LABBARB NONE DETECTED 01/25/2022 0650    Alcohol Level     Component Value Date/Time   ETH <10 01/25/2022 2008    Impression   62 yo woman with hx DM2, multiple prior strokes, uncontrolled HTN noncompliant with prescribed medications who is BIB EMS for increasing weakness over the past few days, lethargy, and abdominal pain.  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 There is e/o multiple prior strokes including new ischemic R BG stroke since last scan 01/27/22 but does not appear acute. BP 240s/120s on arrival, now on clevidipine. She is not on anticoagulation.  Recommendations   - Transfer to [facility] 4N neuro ICU; accepting MD is Dr. Lorrin Goodell. Bed is available, [facility] arranging transport - Clevidipine for goal SBP <150 - SCDs for DVT prophylaxis - Head CT q 6 hrs assess stability of ICH - HOB elevated 30 degrees   This patient is critically ill and at significant  risk of neurological worsening, death and care requires constant monitoring of vital signs, hemodynamics,respiratory and cardiac monitoring, neurological assessment, discussion with family, other specialists and medical decision making of high complexity. I spent 70 minutes of neurocritical care time  in the care of  this patient. This was time spent independent of any time provided by nurse practitioner or PA.   Su Monks, MD [facility] [phone]   If 7pm- 7am, please page neurology on call as listed in [place].

## 2022-10-14 NOTE — ED Triage Notes (Signed)
Pt came to ED via [facility] from home. Husband stated to EMS this is her baseline (does not talk much, nonambulatory) due to hx of strokes and deficits (blurry vision, memory impairment, stutter) but just showing more weakness over the past couple of days. She had a recent UTI and took all the meds prescribed for that however has not been taking any of her regularly prescribed medications. Pt has high BP and HR due noncompliance with medications. Hx of diabetes, BS 276. Pt is not normally ambulatory due to hurt foot EMS stated. Pt stated to me her chest is in pain.

## 2022-10-14 NOTE — ED Notes (Signed)
[facility] arrival. 

## 2022-10-14 NOTE — ED Notes (Signed)
IV attempted by multiple nurses and unsuccessful IV team consulted.

## 2022-10-14 NOTE — ED Provider Notes (Signed)
[facility] Provider Note    Event Date/Time   First MD Initiated Contact with Patient 10/14/22 1238     (approximate)   History   Weakness   HPI  [name] Gilbert is a 62 y.o. female  here with multiple complaints. History provided primarily by husband. He reports that main reason she came in is because she c/o abdominal pain earlier today. She was seen and tx for UTI last week. However, he also reports she has been more confused over past 24 hours and had episodes of being less responsive. She has a h/o multiple strokes and has not been on any meds outside of her diabetes medications and her antibiotics. Denies any major complaints on my assessment but is slightly confused.       Physical Exam   Triage Vital Signs: ED Triage Vitals  Enc Vitals Group     BP 10/14/22 1239 (!) 213/127     Pulse Rate 10/14/22 1239 (!) 114     Resp 10/14/22 1239 14     Temp 10/14/22 1239 98.4 F (36.9 C)     Temp Source 10/14/22 1239 Oral     SpO2 10/14/22 1239 100 %     Weight 10/14/22 1240 156 lb 4.9 oz (70.9 kg)     Height 10/14/22 1240 5\' 2"  (1.575 m)     Head Circumference --      Peak Flow --      Pain Score --      Pain Loc --      Pain Edu? --      Excl. in [place]? --     Most recent vital signs: Vitals:   10/14/22 1556 10/14/22 1600  BP: 126/77 137/77  Pulse: (!) 110   Resp:    Temp:    SpO2: 99%      General: Awake, no distress.  CV:  Good peripheral perfusion. RRR. Resp:  Normal effort.  Abd:  No distention.  Other:  Flat affect. Rightward gaze preference but able to cross midline. Strength 5/5 bl UE and LE. Gait deferred. Speech slightly delayed but not slurred or aphasic.   ED Results / Procedures / Treatments   Labs (all labs ordered are listed, but only abnormal results are displayed) Labs Reviewed  COMPREHENSIVE METABOLIC PANEL - Abnormal; Notable for the following components:      Result Value   Glucose, Bld 258 (*)    BUN 32 (*)     Creatinine, Ser 1.89 (*)    Calcium 8.7 (*)    Total Protein 9.2 (*)    Albumin 3.4 (*)    Total Bilirubin 1.5 (*)    GFR, Estimated 29 (*)    All other components within normal limits  CBC WITH DIFFERENTIAL/PLATELET  BRAIN NATRIURETIC PEPTIDE  ETHANOL  URINALYSIS, ROUTINE W REFLEX MICROSCOPIC  TROPONIN I (HIGH SENSITIVITY)     EKG Sinus tachycardia, VR 119. PR 147, QRS 88, QTc 435. No acute ST elevations. Non specific tw changes.   RADIOLOGY CXR: Clear CT Head: Acute pontine hemorrhage, old infarcts noted, new infarct in right basal ganglia CT A/P: No acute abnormality   I also independently reviewed and agree with radiologist interpretations.   PROCEDURES:  Critical Care performed: Yes, see critical care procedure note(s)  .Critical Care  Performed by: Duffy Bruce, MD Authorized by: Duffy Bruce, MD   Critical care provider statement:    Critical care time (minutes):  30   Critical care time was exclusive  of:  Separately billable procedures and treating other patients   Critical care was necessary to treat or prevent imminent or life-threatening deterioration of the following conditions:  Cardiac failure, circulatory failure and respiratory failure   Critical care was time spent personally by me on the following activities:  Development of treatment plan with patient or surrogate, discussions with consultants, evaluation of patient's response to treatment, examination of patient, ordering and review of laboratory studies, ordering and review of radiographic studies, ordering and performing treatments and interventions, pulse oximetry, re-evaluation of patient's condition and review of old charts Ultrasound ED Peripheral IV (Provider)  Date/Time: 10/14/2022 4:10 PM  Performed by: Duffy Bruce, MD Authorized by: Duffy Bruce, MD   Procedure details:    Indications: hydration     Skin Prep: chlorhexidine gluconate     Location:  Right forearm    Angiocath:  20 G   Bedside Ultrasound Guided: Yes     Images: not archived     Patient tolerated procedure without complications: Yes     Dressing applied: Yes       MEDICATIONS ORDERED IN ED: Medications  clevidipine (CLEVIPREX) infusion 0.5 mg/mL (17 mg/hr Intravenous Rate/Dose Verify 10/14/22 1541)     IMPRESSION / MDM / [place] / ED COURSE  I reviewed the triage vital signs and the nursing notes.                              Differential diagnosis includes, but is not limited to, acute CVA, HTN emergency, ACS, dissection, UTI w/ abdominal pain and symptomatic HTN, AKI  Patient's presentation is most consistent with acute presentation with potential threat to life or bodily function.  The patient is on the cardiac monitor to evaluate for evidence of arrhythmia and/or significant heart rate changes.  62 yo F with h/o HTN, CVAs, here with altered mental status and abd discomfort. Concern for CVA given AMS, marked HTN. Nonadherent with any outpt meds. Sent for stat CT Head which shows pontine hemorrhage, age indeterminate lacunar infarct. Dr. Quinn Axe with neuro consulted - will place on cleviprex, admit to Neuro ICU at [facility]. Pt and family updated. PIV placed by me. CBC shows no leukocytosis. CMP with Cr 1.89. Trop negative. BNP normal. EKG nonischemic. Given her c/o abd pain, CT A/P also obtained and is negative.  BP imrpoving. Accepted to [facility] Neuro ICU.    FINAL CLINICAL IMPRESSION(S) / ED DIAGNOSES   Final diagnoses:  Hemorrhagic stroke ([place])     Rx / DC Orders   ED Discharge Orders     None        Note:  This document was prepared using Dragon voice recognition software and may include unintentional dictation errors.   Duffy Bruce, MD 10/14/22 [phone]

## 2022-10-14 NOTE — ED Notes (Signed)
Gave report to [facility]   

## 2022-10-14 NOTE — ED Notes (Addendum)
Consent for transfer obtained by husband Herbie [place] Day) of patient from [facility] to [facility].

## 2022-10-14 NOTE — ED Notes (Signed)
Spoke to [employer] at [place], patient accepted to Neuro ICU at [facility]  waiting for bed assignment [id number]

## 2022-10-14 NOTE — Plan of Care (Signed)
No evidence of learning. Expressive aphasia and confusion

## 2022-10-14 NOTE — ED Notes (Signed)
EMTALA reviewed by charge RN 

## 2022-10-14 NOTE — ED Notes (Addendum)
SBP raising due to titrating Cleviprex medication down total of 4mg /hr. Watching SBP 130-150 range and keeping at 17mg /hr currently. MD aware.

## 2022-10-14 NOTE — Progress Notes (Signed)
A consult was placed to the hospital's IV Nurse for IV access;  pt not in her room at 1400;  told pt's husband I would return later , but unsure when, due to other calls within the hospital.

## 2022-10-14 NOTE — ED Notes (Signed)
MD and Neurologist at bedside.

## 2022-10-14 NOTE — Consult Note (Signed)
Neurology H&P  CC: Generalized weakness   History is obtained from: Chart review and confirmation with husband  HPI: [name] Gilbert is a 62 y.o. woman with a past medical history significant for diabetes, hypertension, hyperlipidemia, prior strokes, visual impairment, CKD seen by Dr. Quinn Axe at [facility] earlier today  She was brought in by EMS for increasing weakness over several days as well as abdominal pain.  Limited speech at baseline and nonambulatory due to prior strokes and recent foot injury as well as significant memory impairment at baseline.  Per report her husband had called EMS due to 24 hours of diffuse increasing weakness and lethargy.  She had been taking antibiotics for recent UTI (Bactrim per EMR, completed the course) but has been nonadherent to her blood pressure medications antiplatelet agents etc, but does take insulin.  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  Her blood pressures on arrival were 240/120, with some delay in blood pressure management to due to difficult IV access.  However she was subsequently started on Cleviprex  To me husband reports she was generally not feeling good, reported hurting in her stomach and she indicated she wanted to go to the ED. He also notes that she is picky about what she eats, will not eat hospital food (maybe pasta, not baked foods, eats sugar wafers etc)  CT abdomen pelvis was negative for any acute process to explain abdominal pain  LKW: At least 1 day prior to admission Thrombolytic given?: No, ICH IA performed?: No, ICH Premorbid modified rankin scale: 4-5     4 - Moderately severe disability. Unable to attend to own bodily needs without assistance, and unable to walk unassisted.     5 - Severe disability. Requires constant nursing care and attention, bedridden,  incontinent.  ROS: Unable to obtain due to altered mental status.   Past Medical History:  Diagnosis Date   Diabetes mellitus without complication ([place])    Hypertension    Stroke ([place])    visual impaired    Past Surgical History:  Procedure Laterality Date   ABDOMINAL HYSTERECTOMY     TONSILLECTOMY     Current Outpatient Medications  Medication Instructions   acetaminophen (TYLENOL) 650 mg, Oral, Every 4 hours PRN   aspirin 81 mg, Every morning   B Complex-C (B-COMPLEX WITH VITAMIN C) tablet 1 tablet, Oral, Daily   clopidogrel (PLAVIX) 75 mg, Oral, Daily   docusate sodium (COLACE) 100 mg, Oral, 2 times daily   hydrochlorothiazide (HYDRODIURIL) 25 mg, Oral, Daily   insulin glargine (LANTUS) 13 Units, Subcutaneous, Daily   pregabalin (LYRICA) 25 mg, Oral, Daily at bedtime   rosuvastatin (CRESTOR) 40 mg, Oral, Daily at bedtime  Non-adherent  Family History  Problem Relation Age of Onset   Diabetes Father    Social History:  reports that she has never smoked. She has never been exposed to tobacco smoke. She has never used smokeless tobacco. She reports that she does not drink alcohol and does not use drugs.   Exam: Current vital signs: BP 109/72   Pulse (!) 107   Temp 98.5 F (36.9 C) (Oral)   Resp 19   SpO2 96%  Vital signs in last 24 hours: Temp:  [98.4 F (36.9 C)-98.5 F (36.9 C)] 98.5 F (36.9 C) (02/03 1900) Pulse Rate:  [101-128] 107 (02/03 2000) Resp:  [12-20] 19 (02/03 2000)  BP: (104-234)/(57-205) 109/72 (02/03 2000) SpO2:  [94 %-100 %] 96 % (02/03 2000) Weight:  [70.9 kg] 70.9 kg (02/03 1240)   Physical Exam  Constitutional: Appears chronically ill  Psych: Affect pleasant, cooperative but confused Eyes: No scleral injection HENT: No oropharyngeal obstruction.  MSK: no joint deformities.  Cardiovascular: Normal rate and regular rhythm.  Respiratory: Effort normal, non-labored breathing GI: Soft.  No distension. There is no tenderness.  Skin: Warm  dry and intact visible skin  Neuro: Mental Status: Patient is awake, alert, not oriented, not able to give any significant history, has some significant aphasia with inability to name even simple objects such as pen and perseveration on prior words. Cranial Nerves: II: Visual Fields are difficult to assess but blinks to threat bilaterally. Pupils are equal, round, and reactive to light, with noticeable cataracts bilaterally III,IV, VI: Tracks examiner bilaterally V: Facial sensation is symmetric to light eyelash brush VII: Facial movement is symmetric.  VIII: hearing is intact to voice X: Uvula difficult to visualize XII: tongue is midline without atrophy or fasciculations.  Motor: Moving all 4 extremities spontaneously but left lower extremity is externally rotated at the hip.  Right arm greater than left arm pronation with drift of the bilateral legs, slightly greater in the right than the left but both are antigravity Sensory: Reactive to noxious stimulation throughout Cerebellar: Finger-nose intact within limits of weakness Gait:  Nonambulatory at baseline  NIHSS total 8 Score breakdown: 2 points for not answering questions, 1 1 point for right upper extremity weakness, 1 point for left lower extremity weakness, 1 point for right lower extremity weakness, 2 points for severe aphasia, 1 point for dysarthria Performed at 8:30 PM    I have reviewed labs in epic and the results pertinent to this consultation are:  Basic Metabolic Panel: Recent Labs  Lab 10/14/22 1429  NA 137  K 4.5  CL 103  CO2 23  GLUCOSE 258*  BUN 32*  CREATININE 1.89*  CALCIUM 8.7*    CBC: Recent Labs  Lab 10/14/22 1429  WBC 5.6  NEUTROABS 3.3  HGB 13.2  HCT 40.2  MCV 86.6  PLT 291    Coagulation Studies: No results for input(s): "LABPROT", "INR" in the last 72 hours.    I have reviewed the images obtained:  CT head 13:59 personally reviewed, agree with radiology:   1. New infarct in  the right basal ganglia. 2. Small hyperdense focus in the pons, suspicious for a small pontine hemorrhage. 3. The tip of the basilar and the right MCA are slightly hyperdense, possibly artifactual. 4. Frothy secretions in the posterior ethmoid air cells on the right, could be seen in the setting of acute sinusitis.  CT Abdomen/pelvis 1. No acute noncontrast CT findings of the abdomen or pelvis to explain abdominal pain. 2. Occasional descending and sigmoid diverticula without evidence of acute diverticulitis. 3. Status post hysterectomy.  Repeat CT head 22:00 personally reviewed, agree with radiology:   1. Unchanged subcentimeter focus of hemorrhage in the pons. 2. 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   Impression: Hemorrhagic stroke, likely hypertensive vs hemorrhagic conversion of ischemic stroke   Recommendations:  # Pontine hemorrhage - Stroke labs HgbA1c, fasting lipid panel - MRI brain w/ and w/o when stabilized to eval for underlying mass  - MRA of the brain without contrast  - Stability scan in 6 hours - Frequent neuro checks, q1hr  - Echocardiogram - Carotid dopplers - No antiplatelets due to [place] - DVT PPx heparin  at 24 hrs if stable, SCDs for now - Risk factor modification - Telemetry monitoring; 30 day event monitor on discharge if no arrythmias captured  - Blood pressure goal SBP < 140  - PT consult, OT consult, Speech consult when patient stabilized  - Stroke team to admission  # Diabetes - SSI  - A1c, goal < 7% - Carb controlled heart healthy diet  # Abdominal pain # Recent UTI - Abdomen/pelvis CT reassuring, exam benign  - Repeat UA   # Medication nonadherence - Longstanding issue, will continue to counsel patient though unclear cognitive capacity to understand  # Code status - DNR confirmed with husband   Lesleigh Noe MD-PhD [facility] [phone] Available 7 PM to 7 AM, outside of these hours please call Neurologist on call as  listed on Amion.   CRITICAL CARE Performed by: Lorenza Chick   Total critical care time: 35 minutes  Critical care time was exclusive of separately billable procedures and treating other patients.  Critical care was necessary to treat or prevent imminent or life-threatening deterioration.  Critical care was time spent personally by me on the following activities: development of treatment plan with patient and/or surrogate as well as nursing, discussions with consultants, evaluation of patient's response to treatment, examination of patient, obtaining history from patient or surrogate, ordering and performing treatments and interventions, ordering and review of laboratory studies, ordering and review of radiographic studies, pulse oximetry and re-evaluation of patient's condition.

## 2022-10-14 NOTE — ED Notes (Signed)
Husband arrival to bedside.

## 2022-10-15 ENCOUNTER — Inpatient Hospital Stay (HOSPITAL_COMMUNITY): Payer: Medicare HMO

## 2022-10-15 DIAGNOSIS — I6389 Other cerebral infarction: Secondary | ICD-10-CM | POA: Diagnosis not present

## 2022-10-15 DIAGNOSIS — I613 Nontraumatic intracerebral hemorrhage in brain stem: Secondary | ICD-10-CM | POA: Diagnosis not present

## 2022-10-15 LAB — URINALYSIS, ROUTINE W REFLEX MICROSCOPIC
Bacteria, UA: NONE SEEN
Bilirubin Urine: NEGATIVE
Glucose, UA: >=500 mg/dL — AB
Hgb urine dipstick: NEGATIVE
Ketones, ur: 20 mg/dL — AB
Leukocytes,Ua: NEGATIVE
Nitrite: NEGATIVE
Protein, ur: >=300 mg/dL — AB
Specific Gravity, Urine: 1.018 (ref 1.005–1.030)
pH: 5 (ref 5.0–8.0)

## 2022-10-15 LAB — FOLATE: Folate: 7.6 ng/mL (ref >5.9)

## 2022-10-15 LAB — ECHOCARDIOGRAM COMPLETE
AR max vel: 1.64 cm2
AV Area VTI: 1.89 cm2
AV Area mean vel: 1.54 cm2
AV Mean grad: 3 mmHg
AV Peak grad: 4.1 mmHg
Ao pk vel: 1.01 m/s
Area-P 1/2: 3.23 cm2
Height: 62 in
S' Lateral: 2.4 cm
Weight: 2500.9 oz

## 2022-10-15 LAB — HEMOGLOBIN A1C
Hgb A1c MFr Bld: 7.8 % — ABNORMAL HIGH (ref 4.8–5.6)
Mean Plasma Glucose: 177.16 mg/dL

## 2022-10-15 LAB — GLUCOSE, CAPILLARY
Glucose-Capillary: 134 mg/dL — ABNORMAL HIGH (ref 70–99)
Glucose-Capillary: 177 mg/dL — ABNORMAL HIGH (ref 70–99)
Glucose-Capillary: 207 mg/dL — ABNORMAL HIGH (ref 70–99)
Glucose-Capillary: 229 mg/dL — ABNORMAL HIGH (ref 70–99)
Glucose-Capillary: 232 mg/dL — ABNORMAL HIGH (ref 70–99)
Glucose-Capillary: 305 mg/dL — ABNORMAL HIGH (ref 70–99)
Glucose-Capillary: 337 mg/dL — ABNORMAL HIGH (ref 70–99)

## 2022-10-15 LAB — LIPID PANEL
Cholesterol: 337 mg/dL — ABNORMAL HIGH (ref 0–200)
HDL: 78 mg/dL (ref >40)
LDL Cholesterol: 241 mg/dL — ABNORMAL HIGH (ref 0–99)
Total CHOL/HDL Ratio: 4.3 RATIO
Triglycerides: 92 mg/dL (ref <150)
VLDL: 18 mg/dL (ref 0–40)

## 2022-10-15 LAB — VITAMIN B12: Vitamin B-12: 301 pg/mL (ref 180–914)

## 2022-10-15 LAB — HIV ANTIBODY (ROUTINE TESTING W REFLEX): HIV Screen 4th Generation wRfx: NONREACTIVE

## 2022-10-15 MED ORDER — GADOBUTROL 1 MMOL/ML IV SOLN
7.0000 mL | Freq: Once | INTRAVENOUS | Status: AC | PRN
  Administered 2022-10-15: 7 mL via INTRAVENOUS

## 2022-10-15 MED ORDER — SODIUM CHLORIDE 0.9 % IV BOLUS
500.0000 mL | Freq: Once | INTRAVENOUS | Status: AC
  Administered 2022-10-15: 500 mL via INTRAVENOUS

## 2022-10-15 MED ORDER — CHLORHEXIDINE GLUCONATE CLOTH 2 % EX PADS
6.0000 | MEDICATED_PAD | Freq: Every day | CUTANEOUS | Status: DC
  Administered 2022-10-15 – 2022-10-16 (×2): 6 via TOPICAL

## 2022-10-15 MED ORDER — HEPARIN SODIUM (PORCINE) 5000 UNIT/ML IJ SOLN
5000.0000 [IU] | Freq: Three times a day (TID) | INTRAMUSCULAR | Status: DC
  Administered 2022-10-15 – 2022-10-25 (×25): 5000 [IU] via SUBCUTANEOUS
  Filled 2022-10-15 (×27): qty 1

## 2022-10-15 MED ORDER — AMLODIPINE BESYLATE 10 MG PO TABS
10.0000 mg | ORAL_TABLET | Freq: Every day | ORAL | Status: DC
  Administered 2022-10-15 – 2022-10-25 (×10): 10 mg via ORAL
  Filled 2022-10-15 (×11): qty 1

## 2022-10-15 NOTE — Evaluation (Signed)
Physical Therapy Evaluation Patient Details Name: [name] Gilbert MRN: [id number] DOB: [birth year] Today's Date: 10/15/2022  History of Present Illness  [name] Gilbert is a 62 y.o. woman presenting to hospital with generalized weakness. CT: new infarct in the right basal ganglia and small hyperdense focus in the pons, suspicious for a small  pontine hemorrhage. 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 PHMx:diabetes, hypertension, hyperlipidemia, prior strokes, visual impairment, CKD, medication nonadherance   Clinical Impression  Pt admitted with above. History provided by spouse in which he reports "She will do nothing for me at home but she will stand and walk with HHPT." Pt presenting with generalized weakness, noted R UE extensor synergy, ataxia, expressive difficulty, impaired motor planning, L inattention, and impaired balance. Pt to benefit from SNF Upon d/c to achieve mod I level of function as pt is home alone during the day 3x/wk. Acute PT to cont to follow.       Recommendations for follow up therapy are one component of a multi-disciplinary discharge planning process, led by the attending physician.  Recommendations may be updated based on patient status, additional functional criteria and insurance authorization.  Follow Up Recommendations Skilled nursing-short term rehab (<3 hours/day) Can patient physically be transported by private vehicle: No    Assistance Recommended at Discharge Frequent or constant Supervision/Assistance  Patient can return home with the following  A little help with walking and/or transfers;A little help with bathing/dressing/bathroom;Assist for transportation;Help with stairs or ramp for entrance    Equipment Recommendations  (TBD)  Recommendations for Other Services       Functional Status Assessment Patient has had a recent decline in their functional status and demonstrates the ability to make significant improvements in function in a reasonable and  predictable amount of time.     Precautions / Restrictions Precautions Precautions: Fall Restrictions Weight Bearing Restrictions: No      Mobility  Bed Mobility Overal bed mobility: Needs Assistance Bed Mobility: Supine to Sit     Supine to sit: Mod assist, +2 for physical assistance, HOB elevated     General bed mobility comments: max directional verbal and tactile cues to initiate, LE movement towards EOB, modA for trunk elevation and to scoot hips to EOB to bring feet on the floor    Transfers Overall transfer level: Needs assistance Equipment used: 2 person hand held assist (face to face transfer with gait belt) Transfers: Sit to/from Stand Sit to Stand: Mod assist, +2 physical assistance           General transfer comment: pt with good initiation to power up, pt with retro pulsion and R LE in extension, difficulty getting full trunk extension/upright posture    Ambulation/Gait               General Gait Details: pt did take side steps towards head of bed, pt requiring modA for R LE advancement with noted ataxia, pt able to advance L LE with modA to weight shift to the R  Stairs            Wheelchair Mobility    Modified Rankin (Stroke Patients Only) Modified Rankin (Stroke Patients Only) Pre-Morbid Rankin Score: Moderately severe disability Modified Rankin: Moderately severe disability     Balance Overall balance assessment: Needs assistance Sitting-balance support: Feet supported, Single extremity supported Sitting balance-Leahy Scale: Fair Sitting balance - Comments: initially maxA, with verbal cues to bring chest forward pt became close min guard still with posterior bias Postural control: Posterior lean Standing balance support:  Bilateral upper extremity supported, During functional activity Standing balance-Leahy Scale: Poor Standing balance comment: dependent on external support                             Pertinent  Vitals/Pain Pain Assessment Pain Assessment: No/denies pain    Home Living Family/patient expects to be discharged to:: Private residence Living Arrangements: Spouse/significant other Available Help at Discharge: Family;Available PRN/intermittently (daughter and spouse live with pt, both work, 3x/wk pt home alone during the day) Type of Home: House Home Access: [place]: One [place]: Conservation officer, nature (2 wheels);Wheelchair - manual      Prior Function Prior Level of Function : Needs assist             Mobility Comments: bed/w/c bound, only stands/walks with HHPT, will std pvt from EOB to w/c however recently hasn't been mobilizing for spouse ADLs Comments: spouse gives bed bath, dresses does all IADLs, pt was feeding self but had difficulty     Hand Dominance   Dominant Hand: Right    Extremity/Trunk Assessment   Upper Extremity Assessment Upper Extremity Assessment: Defer to OT evaluation (pt with noted extensor reflexive posturing t/o R UE)    Lower Extremity Assessment Lower Extremity Assessment: Generalized weakness (noted R LE ataxia when attempting to step, no knee buckling bilat when in standing, R LE in external rotation at hip)    Cervical / Trunk Assessment Cervical / Trunk Assessment: Normal  Communication   Communication: Expressive difficulties (not very conversant at baseline)  Cognition Arousal/Alertness: Awake/alert Behavior During Therapy: Flat affect Overall Cognitive Status: History of cognitive impairments - at baseline                                 General Comments: pt with poor memory at baseline. pt also with delayed processing, impaired motor planning/sequencing, follows majority of simple commands with increased time        General Comments General comments (skin integrity, edema, etc.): VSS    Exercises     Assessment/Plan    PT Assessment Patient needs continued PT services  PT  Problem List Decreased strength;Decreased range of motion;Decreased activity tolerance;Decreased balance;Decreased mobility;Decreased coordination;Decreased cognition;Decreased knowledge of use of DME;Decreased safety awareness       PT Treatment Interventions DME instruction;Gait training;Stair training;Functional mobility training;Therapeutic activities;Therapeutic exercise;Balance training    PT Goals (Current goals can be found in the Care Plan section)  Acute Rehab PT Goals Patient Stated Goal: didn't state PT Goal Formulation: With patient/family Time For Goal Achievement: 10/29/22 Potential to Achieve Goals: Good    Frequency Min 3X/week     Co-evaluation               AM-PAC PT "6 Clicks" Mobility  Outcome Measure Help needed turning from your back to your side while in a flat bed without using bedrails?: A Lot Help needed moving from lying on your back to sitting on the side of a flat bed without using bedrails?: A Lot Help needed moving to and from a bed to a chair (including a wheelchair)?: A Lot Help needed standing up from a chair using your arms (e.g., wheelchair or bedside chair)?: A Lot Help needed to walk in hospital room?: Total Help needed climbing 3-5 steps with a railing? : Total 6 Click Score: 10  End of Session Equipment Utilized During Treatment: Gait belt Activity Tolerance: Patient tolerated treatment well Patient left: in bed;with call bell/phone within reach;with bed alarm set;with family/visitor present Nurse Communication: Mobility status PT Visit Diagnosis: Unsteadiness on feet (R26.81);Muscle weakness (generalized) (M62.81)    Time: 0240-9735 PT Time Calculation (min) (ACUTE ONLY): 26 min   Charges:   PT Evaluation $PT Eval Moderate Complexity: 1 Mod          Kittie Plater, PT, DPT Acute Rehabilitation Services Secure chat preferred Office #: [phone]   Berline Lopes 10/15/2022, 11:55 AM

## 2022-10-15 NOTE — Progress Notes (Signed)
Just spoke with NP Thressa Sheller,  D/t recent findings in MRI orders were given to change patient's bp parameters to sys<160.

## 2022-10-15 NOTE — Evaluation (Signed)
Occupational Therapy Evaluation Patient Details Name: [name] Gilbert MRN: [id number] DOB: 03-11-1961 Today's Date: 10/15/2022   History of Present Illness [name] Gilbert is a 62 y.o. woman presenting to hospital with generalized weakness. CT: new infarct in the right basal ganglia and small hyperdense focus in the pons, suspicious for a small  pontine hemorrhage. 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 PHMx:diabetes, hypertension, hyperlipidemia, prior strokes, visual impairment, CKD, medication nonadherance   Clinical Impression   This 62 yo female admitted with above presents to acute OT with PLOF of needing A from husband for all basic ADLs except self feeding. She currently is at he baseline but also now needs A for self feeding. Also now needs more A for overall mobility. Feel pt does have the potential to do more for herself and decrease burden on her husband with follow up OT at SNF.     Recommendations for follow up therapy are one component of a multi-disciplinary discharge planning process, led by the attending physician.  Recommendations may be updated based on patient status, additional functional criteria and insurance authorization.   Follow Up Recommendations  Skilled nursing-short term rehab (<3 hours/day)     Assistance Recommended at Discharge Frequent or constant Supervision/Assistance  Patient can return home with the following Two people to help with walking and/or transfers;Two people to help with bathing/dressing/bathroom;Assistance with cooking/housework;Assistance with feeding;Assist for transportation;Help with stairs or ramp for entrance;Direct supervision/assist for financial management;Direct supervision/assist for medications management    Functional Status Assessment  Patient has had a recent decline in their functional status and demonstrates the ability to make significant improvements in function in a reasonable and predictable amount of time.  Equipment  Recommendations  None recommended by OT       Precautions / Restrictions Precautions Precautions: Fall Restrictions Weight Bearing Restrictions: No      Mobility Bed Mobility Overal bed mobility: Needs Assistance Bed Mobility: Supine to Sit     Supine to sit: Mod assist, +2 for physical assistance, HOB elevated     General bed mobility comments: max directional verbal and tactile cues to initiate, LE movement towards EOB, modA for trunk elevation and to scoot hips to EOB to bring feet on the floor    Transfers Overall transfer level: Needs assistance Equipment used: 2 person hand held assist Transfers: Sit to/from Stand Sit to Stand: Mod assist, +2 physical assistance           General transfer comment: pt with good initiation to power up, pt with retro pulsion and R LE in extension, difficulty getting full trunk extension/upright posture      Balance Overall balance assessment: Needs assistance Sitting-balance support: Feet supported, Single extremity supported Sitting balance-Leahy Scale: Fair Sitting balance - Comments: initially maxA, with verbal cues to bring chest forward pt became close min guard still with posterior bias Postural control: Posterior lean Standing balance support: Bilateral upper extremity supported, During functional activity Standing balance-Leahy Scale: Poor Standing balance comment: dependent on external support                           ADL either performed or assessed with clinical judgement   ADL                                         General ADL Comments: Per husband she can feed herself if placed  where she can see it easily, but otherwise she is total A at bed level for bathing and dressing  as well as toileting (wears Depends)     Vision   Additional Comments: Decreased per husband from her 2nd stroke            Pertinent Vitals/Pain Pain Assessment Pain Assessment: No/denies pain     Hand  Dominance Right   Extremity/Trunk Assessment Upper Extremity Assessment Upper Extremity Assessment: RUE deficits/detail;LUE deficits/detail RUE Deficits / Details: pt with noted extensor reflexive posturing t/o R UE that could be easily broken by asked her to bend her arm RUE Coordination: decreased fine motor;decreased gross motor LUE Deficits / Details: generalized weakness as well           Communication Communication Communication: Expressive difficulties (not very conversant at baseline)   Cognition Arousal/Alertness: Awake/alert Behavior During Therapy: Flat affect Overall Cognitive Status: History of cognitive impairments - at baseline                                 General Comments: pt with poor memory at baseline. pt also with delayed processing, impaired motor planning/sequencing, follows majority of simple commands with increased time     General Comments  VSS            Home Living Family/patient expects to be discharged to:: Private residence Living Arrangements: Spouse/significant other Available Help at Discharge: Family;Available PRN/intermittently (daughter and spouse live with pt, both work, 3x/wk pt home alone during the day) Type of Home: House Home Access: [place]: One level     Bathroom Shower/Tub: Teacher, early years/pre: Standard Bathroom Accessibility: Yes How Accessible: Accessible via walker Home Equipment: [place] (2 wheels);Wheelchair - manual   Additional Comments: Information gathered from husband in room. Pt has someone with her 4 days a week 24 hours a day, 3 days she is home alone during the day while her husband works and he calls to check on her      Prior Functioning/Environment Prior Level of Function : Needs assist             Mobility Comments: bed/w/c bound, only stands/walks with HHPT, will std pvt from EOB to w/c however recently hasn't been mobilizing for  spouse ADLs Comments: spouse gives bed bath, dresses does all IADLs, pt was feeding self but had difficulty        OT Problem List: Decreased strength;Impaired balance (sitting and/or standing);Impaired vision/perception;Impaired UE functional use      OT Treatment/Interventions: Self-care/ADL training;DME and/or AE instruction;Patient/family education;Balance training;Therapeutic activities    OT Goals(Current goals can be found in the care plan section) Acute Rehab OT Goals Patient Stated Goal: husband would like for her to be able to do more OT Goal Formulation: With patient/family Time For Goal Achievement: 10/29/22 Potential to Achieve Goals: Fair  OT Frequency: Min 2X/week    Co-evaluation PT/OT/SLP Co-Evaluation/Treatment: Yes Reason for Co-Treatment: For patient/therapist safety;To address functional/ADL transfers PT goals addressed during session: Mobility/safety with mobility;Balance OT goals addressed during session: Strengthening/ROM;ADL's and self-care      AM-PAC OT "6 Clicks" Daily Activity     Outcome Measure Help from another person eating meals?: A Little Help from another person taking care of personal grooming?: A Lot Help from another person toileting, which includes using toliet, bedpan, or urinal?: Total Help from another person bathing (including  washing, rinsing, drying)?: Total Help from another person to put on and taking off regular upper body clothing?: Total Help from another person to put on and taking off regular lower body clothing?: Total 6 Click Score: 9   End of Session Nurse Communication: Mobility status  Activity Tolerance: Patient tolerated treatment well Patient left: in bed;with call bell/phone within reach;with bed alarm set;with family/visitor present  OT Visit Diagnosis: Unsteadiness on feet (R26.81);Other abnormalities of gait and mobility (R26.89);Muscle weakness (generalized) (M62.81);Other symptoms and signs involving cognitive  function;Hemiplegia and hemiparesis Hemiplegia - Right/Left: Right Hemiplegia - dominant/non-dominant: Dominant Hemiplegia - caused by: Cerebral infarction                Time: 6063-0160 OT Time Calculation (min): 24 min Charges:  OT General Charges $OT Visit: 1 Visit OT Evaluation $OT Eval Moderate Complexity: [place] Office [phone]    Almon Register 10/15/2022, 4:47 PM

## 2022-10-15 NOTE — Plan of Care (Signed)
Poor urine output 500 cc bolus over 2 hours of normal saline ordered

## 2022-10-15 NOTE — Progress Notes (Signed)
OT Cancellation Note  Patient Details Name: [name] Gilbert MRN: [id number] DOB: [birth year]   Cancelled Treatment:    Reason Eval/Treat Not Completed: Active bedrest order. Pt on bedrest for 24 hours starting 10/14/2022 at 20:31. Will attempt eval tomorrow as patient will be off bedrest later today.  [place] Office [phone]    Almon Register 10/15/2022, 7:16 AM

## 2022-10-15 NOTE — Progress Notes (Addendum)
Orders state patient's bp sys <150, Cleviprex states sys < 120, NP Lehner states sys bp < 140.   This was realized at 8.Marland Kitchen..Marland Kitchenwill work to keep patient's sys <140.

## 2022-10-15 NOTE — Progress Notes (Addendum)
STROKE TEAM PROGRESS NOTE   INTERVAL HISTORY Her husband and echo tech are at the bedside.    Patient transferred from [facility] BIB EMS due to 24 hours of diffuse increasing weakness and lethargy, EMS called by her husband.  Per husband patient is not compliant with blood pressure medications as well as antiplatelet agents, aspirin and Plavix, at home.  Patient has history of previous strokes; limited speech and nonambulatory at baseline from previous strokes and recent foot injury. Patient also has significant memory impairment at baseline.   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    Vitals:   10/15/22 0900 10/15/22 0915 10/15/22 0930 10/15/22 1000  BP: (!) 154/75 139/71 (!) 145/70 (!) 140/102  Pulse: 74  71 78  Resp: 18  12 18   Temp:      TempSrc:      SpO2: 100%  99% 99%   CBC:  Recent Labs  Lab 10/14/22 1429  WBC 5.6  NEUTROABS 3.3  HGB 13.2  HCT 40.2  MCV 86.6  PLT 154   Basic Metabolic Panel:  Recent Labs  Lab 10/14/22 1429  NA 137  K 4.5  CL 103  CO2 23  GLUCOSE 258*  BUN 32*  CREATININE 1.89*  CALCIUM 8.7*   Lipid Panel: No results for input(s): "CHOL", "TRIG", "HDL", "CHOLHDL", "VLDL", "LDLCALC" in the last 168 hours. HgbA1c: No results for input(s): "HGBA1C" in the last 168 hours. Urine Drug Screen: No results for input(s): "LABOPIA", "COCAINSCRNUR", "LABBENZ", "AMPHETMU", "THCU", "LABBARB" in the last 168 hours.  Alcohol Level  Recent Labs  Lab 10/14/22 1429  ETH <10    IMAGING past 24 hours CT HEAD WO CONTRAST (5MM)  Result Date: 10/14/2022 CLINICAL DATA:  Pontine hemorrhage EXAM: CT HEAD WITHOUT CONTRAST TECHNIQUE: Contiguous axial images were obtained from the base of the skull through the vertex without intravenous contrast. RADIATION DOSE REDUCTION: This exam was performed according to the departmental dose-optimization program which includes  automated exposure control, adjustment of the mA and/or kV according to patient size and/or use of iterative reconstruction technique. COMPARISON:  Head CT 10/14/2022 at 1:58 p.m. 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 Subcentimeter focus of hemorrhage in the pons is unchanged. There is periventricular hypoattenuation compatible with chronic microvascular disease. No extra-axial collection. Vascular: No hyperdense vessel or unexpected calcification. Skull: Normal. Negative for fracture or focal lesion. Sinuses/Orbits: No acute finding. Other: None. IMPRESSION: 1. Unchanged subcentimeter focus of hemorrhage in the pons. 2. 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 Electronically Signed   By: Ulyses Jarred M.D.   On: 10/14/2022 22:29   CT ABDOMEN PELVIS WO CONTRAST  Result Date: 10/14/2022 CLINICAL DATA:  Abdominal pain EXAM: CT ABDOMEN AND PELVIS WITHOUT CONTRAST TECHNIQUE: Multidetector CT imaging of the abdomen and pelvis was performed following the standard protocol without IV contrast. RADIATION DOSE REDUCTION: This exam was performed according to the departmental dose-optimization program which includes automated exposure control, adjustment of the mA and/or kV according to patient size and/or use of iterative reconstruction technique. COMPARISON:  06/12/2021 FINDINGS: Lower chest: No acute abnormality. Hepatobiliary: No solid liver abnormality is seen. No gallstones, gallbladder wall thickening, or biliary dilatation. Pancreas: Unremarkable. No pancreatic ductal dilatation or surrounding inflammatory changes. Spleen: Normal in size without significant abnormality. Adrenals/Urinary Tract: Adrenal glands are unremarkable. Kidneys are normal, without renal calculi, solid lesion, or hydronephrosis. Bladder is unremarkable. Stomach/Bowel: Stomach is within  normal limits. Appendix appears normal. No evidence of bowel wall thickening, distention, or inflammatory changes. Occasional descending and  sigmoid diverticula. Stool balls in the rectum. Vascular/Lymphatic: Scattered aortic atherosclerosis. No enlarged abdominal or pelvic lymph nodes. Reproductive: Status post hysterectomy. Other: No abdominal wall hernia or abnormality. No ascites. Musculoskeletal: No acute or significant osseous findings. IMPRESSION: 1. No acute noncontrast CT findings of the abdomen or pelvis to explain abdominal pain. 2. Occasional descending and sigmoid diverticula without evidence of acute diverticulitis. 3. Status post hysterectomy. Aortic Atherosclerosis (ICD10-I70.0). Electronically Signed   By: Jearld Lesch M.D.   On: 10/14/2022 14:20   CT HEAD WO CONTRAST ( )  Result Date: 10/14/2022 CLINICAL DATA:  Delirium EXAM: CT HEAD WITHOUT CONTRAST TECHNIQUE: Contiguous axial images were obtained from the base of the skull through the vertex without intravenous contrast. RADIATION DOSE REDUCTION: This exam was performed according to the departmental dose-optimization program which includes automated exposure control, adjustment of the mA and/or kV according to patient size and/or use of iterative reconstruction technique. 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 There is also an infarct involving the right parietal lobe, which is also unchanged. There is sequela of severe chronic microvascular ischemic change. Compared to prior exam there is new infarct in the right basal ganglia (series 2, image 15). There is also a small hyperdense focus in the pons (series 2, image 8) represent a small pontine hemorrhage. No hydrocephalus. No extra-axial fluid collection. Vascular: The tip of the basilar and the right MCA are slightly hyperdense, possibly artifactual. Skull: Normal. Negative for fracture or focal lesion. Sinuses/Orbits: No mastoid or ear effusion. Mild mucosal thickening right maxillary sinus. Bilateral  orbits are unremarkable. Frothy secretions in the posterior ethmoid air cells on the right, could be seen in the setting acute sinusitis. Other: None. IMPRESSION: 1. 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 2. Small hyperdense focus in the pons, suspicious for a small pontine hemorrhage. 3. The tip of the basilar and the right MCA are slightly hyperdense, possibly artifactual. 4. Frothy secretions in the posterior ethmoid air cells on the right, could be seen in the setting of acute sinusitis. Electronically Signed   By: Lorenza Cambridge M.D.   On: 10/14/2022 14:13   DG Chest Portable 1 View  Result Date: 10/14/2022 CLINICAL DATA:  Weakness. EXAM: PORTABLE CHEST 1 VIEW COMPARISON:  01/25/2022 FINDINGS: The lungs are clear without focal pneumonia, edema, pneumothorax or pleural effusion. The cardiopericardial silhouette is within normal limits for size. The visualized bony structures of the thorax are unremarkable. Telemetry leads overlie the chest. IMPRESSION: No active disease. Electronically Signed   By: Kennith Center M.D.   On: 10/14/2022 13:38    PHYSICAL EXAM Constitutional: Appears chronically ill  Psych: Affect pleasant, cooperative but confused Eyes: No scleral injection HENT: No oropharyngeal obstruction.  MSK: no joint deformities.  Cardiovascular: Normal rate and regular rhythm.  Respiratory: Effort normal, non-labored breathing GI: Soft.  No distension. There is no tenderness.  Skin: Warm dry and intact visible skin   Neuro: Mental Status: Patient is awake, alert. Oriented to self and place, not year. Soft voice.  Intermittently does not follow simple commands or mimic.  Cranial Nerves: II: Visual Fields are difficult to assess but blinks to threat bilaterally. Pupils are equal, round, and reactive to light, with noticeable cataracts bilaterally III,IV, VI: Tracks examiner throughout assessment.  V: Facial sensation is symmetric  to light touch VII: Facial movement is symmetric.   VIII: hearing is intact to voice X: Uvula difficult to visualize XII: tongue is midline   Motor: RUE: 3/5.  RLE: 2/5, drift present, minimal lift.  LUE: 4-/5, no drift LLE: 4-/5, no drift Sensory: Reactive to noxious stimulation throughout Cerebellar: Finger-nose intact within limits of weakness, slight ataxia LUE Gait:  Nonambulatory at baseline. Deferred assessment.   ASSESSMENT/PLAN Ms. Aalliyah Kilker is a 62 y.o. woman with a past medical history significant for diabetes, hypertension, hyperlipidemia, prior strokes, visual impairment, CKD seen by Dr. Quinn Axe [facility] earlier today   She was brought in by EMS for increasing weakness over several days as well as abdominal pain.  Limited speech at baseline and nonambulatory due to prior strokes and recent foot injury as well as significant memory impairment at baseline. She had been taking antibiotics for recent UTI (Bactrim per EMR, completed the course) but has been nonadherent to her blood pressure medications antiplatelet agents etc, but does take insulin.  Her blood pressures on arrival were 240/120. LKW: At least 1 day prior to admission Thrombolytic given?: No, ICH IA performed?: No, ICH Premorbid modified rankin scale: 4-5     4 - Moderately severe disability. Unable to attend to own bodily needs without assistance, and unable to walk unassisted.     5 - Severe disability. Requires constant nursing care and attention, bedridden, incontinent.  Hemorrhagic Stroke Etiology: Likely hypertensive vs hemorrhagic conversion of ischemic stroke    CT head: New infarct in the right basal ganglia. Small hyperdense focus in the pons, suspicious for a small pontine hemorrhage. The tip of the basilar and the right MCA are slightly hyperdense, possibly artifactual. Repeat CT head: Unchanged subcentimeter focus of hemorrhage in the pons. 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  MRI/MRA: Acute infarcts in the right  basal ganglia, right frontal lobe, and posterior limb of the internal capsule on the right. Acute infarct in the left frontal lobe adjacent to the left frontal horn. Re demonstrated focus of hemorrhage in the central pons, likely unchagned from prior when accounting for differences in technique. 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 No evidence of an underlying contrast-enhancing lesion. Occlusion of the left PCA at the P1/P2 junction. At least moderate stenosis in the cavernous segment of the left ICA Possible moderate to severe stenosis in the distal A2 segment of the left ACA. Multifocal regions of high-grade stenosis in the proximal M2 segments of the left MCA.    2D Echo:  LVEF 62-95%, Grade I diastolic dysfunction No MVR, AVR  Carotid Dopplers: pending LDL pending. HgbA1c pending VTE prophylaxis - DVT PPx heparin added (24hr imaging bleed stable, multiple infarcts seen)    Diet   Diet heart healthy/carb modified Room service appropriate? Yes with Assist; Fluid consistency: Thin   aspirin 81 mg daily prior to admission, now on No antithrombotic due to hemorrhagic stroke  Therapy recommendations:  pending Disposition:  pending  Hypertension Home meds:  none Blood pressure goal changed to SBP <160 d/t multiple infarcts seen on MRI.  Long-term BP goal normotensive  Hyperlipidemia Home meds:  Crestor 40mg  LDL pending,  goal < 70 Continue statin at discharge  Diabetes type II Uncontrolled Home meds:  Lantus HgbA1c pending , goal < 7.0 CBGs Recent Labs    10/15/22 0207 10/15/22 0352 10/15/22 0755  GLUCAP 305* 207* 177*    SSI  ACHS Diabetes Coordinator Consult   Other Stroke Risk Factors  Hx stroke/TIA   Hospital day # 1   Pt seen by Neuro NP/APP and later by MD. Note/plan to be edited by MD as needed.    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  She appears to be near her baseline.  She has had history of multiple strokes due to noncompliance.  MRI today shows new ischemic strokes on top of a hemorrhagic stroke.  Hold off on antiplatelet for now.  Liberalize systolic blood pressure goal to less than 160.  Continue to wean Cleviprex.  Discussed importance of medication adherence with the patient as well as her husband.  Her husband tells me that she refuses to take medications at home for diabetes or blood pressure.  He understands the risk of recurrent stroke due to noncompliance.  PT OT therapy still pending.  Dr. Reeves Forth evaluated pt independently, reviewed imaging, chart, labs. Discussed and formulated plan with the Resident/APP. Changes were made to the note where appropriate. Please see APP/resident note above for details.    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 This patient's care requires constant monitoring of vital signs, hemodynamics, respiratory and cardiac monitoring, review of multiple databases, neurological assessment, discussion with family, other specialists and medical decision making of high complexity. I spent 35 minutes of neurocritical care time in the care of this patient.   Fatisha Rabalais,MD    To contact Stroke Continuity provider, please refer to [email or web address] After hours, contact General Neurology

## 2022-10-15 NOTE — H&P (Signed)
Please see consult note from 2/3, misfiled as consult note

## 2022-10-16 ENCOUNTER — Inpatient Hospital Stay (HOSPITAL_COMMUNITY): Payer: Medicare HMO

## 2022-10-16 DIAGNOSIS — I6523 Occlusion and stenosis of bilateral carotid arteries: Secondary | ICD-10-CM

## 2022-10-16 DIAGNOSIS — I613 Nontraumatic intracerebral hemorrhage in brain stem: Secondary | ICD-10-CM | POA: Diagnosis not present

## 2022-10-16 LAB — CBC WITH DIFFERENTIAL/PLATELET
Abs Immature Granulocytes: 0.02 10*3/uL (ref 0.00–0.07)
Basophils Absolute: 0 10*3/uL (ref 0.0–0.1)
Basophils Relative: 0 %
Eosinophils Absolute: 0.1 10*3/uL (ref 0.0–0.5)
Eosinophils Relative: 1 %
HCT: 31.8 % — ABNORMAL LOW (ref 36.0–46.0)
Hemoglobin: 10.3 g/dL — ABNORMAL LOW (ref 12.0–15.0)
Immature Granulocytes: 0 %
Lymphocytes Relative: 26 %
Lymphs Abs: 1.5 10*3/uL (ref 0.7–4.0)
MCH: 28.7 pg (ref 26.0–34.0)
MCHC: 32.4 g/dL (ref 30.0–36.0)
MCV: 88.6 fL (ref 80.0–100.0)
Monocytes Absolute: 0.2 10*3/uL (ref 0.1–1.0)
Monocytes Relative: 4 %
Neutro Abs: 3.9 10*3/uL (ref 1.7–7.7)
Neutrophils Relative %: 69 %
Platelets: 195 10*3/uL (ref 150–400)
RBC: 3.59 MIL/uL — ABNORMAL LOW (ref 3.87–5.11)
RDW: 16 % — ABNORMAL HIGH (ref 11.5–15.5)
WBC: 5.7 10*3/uL (ref 4.0–10.5)
nRBC: 0 % (ref 0.0–0.2)

## 2022-10-16 LAB — BASIC METABOLIC PANEL
Anion gap: 12 (ref 5–15)
BUN: 36 mg/dL — ABNORMAL HIGH (ref 8–23)
CO2: 18 mmol/L — ABNORMAL LOW (ref 22–32)
Calcium: 8.8 mg/dL — ABNORMAL LOW (ref 8.9–10.3)
Chloride: 109 mmol/L (ref 98–111)
Creatinine, Ser: 2.21 mg/dL — ABNORMAL HIGH (ref 0.44–1.00)
GFR, Estimated: 24 mL/min — ABNORMAL LOW (ref >60)
Glucose, Bld: 111 mg/dL — ABNORMAL HIGH (ref 70–99)
Potassium: 5.8 mmol/L — ABNORMAL HIGH (ref 3.5–5.1)
Sodium: 139 mmol/L (ref 135–145)

## 2022-10-16 LAB — GLUCOSE, CAPILLARY
Glucose-Capillary: 327 mg/dL — ABNORMAL HIGH (ref 70–99)
Glucose-Capillary: 105 mg/dL — ABNORMAL HIGH (ref 70–99)
Glucose-Capillary: 150 mg/dL — ABNORMAL HIGH (ref 70–99)
Glucose-Capillary: 228 mg/dL — ABNORMAL HIGH (ref 70–99)

## 2022-10-16 LAB — TRIGLYCERIDES: Triglycerides: 123 mg/dL (ref <150)

## 2022-10-16 MED ORDER — LEVETIRACETAM 250 MG PO TABS
250.0000 mg | ORAL_TABLET | Freq: Two times a day (BID) | ORAL | Status: DC
  Administered 2022-10-16: 250 mg via ORAL
  Filled 2022-10-16 (×3): qty 1

## 2022-10-16 MED ORDER — INSULIN GLARGINE-YFGN 100 UNIT/ML ~~LOC~~ SOLN
15.0000 [IU] | Freq: Every day | SUBCUTANEOUS | Status: DC
  Administered 2022-10-16 – 2022-10-17 (×2): 15 [IU] via SUBCUTANEOUS
  Filled 2022-10-16 (×2): qty 0.15

## 2022-10-16 MED ORDER — SODIUM CHLORIDE 0.9 % IV BOLUS
250.0000 mL | Freq: Once | INTRAVENOUS | Status: AC
  Administered 2022-10-16: 250 mL via INTRAVENOUS

## 2022-10-16 MED ORDER — SPIRONOLACTONE 25 MG PO TABS
50.0000 mg | ORAL_TABLET | Freq: Every day | ORAL | Status: DC
  Administered 2022-10-16 – 2022-10-17 (×2): 50 mg via ORAL
  Filled 2022-10-16 (×2): qty 2

## 2022-10-16 MED ORDER — MIRTAZAPINE 15 MG PO TBDP
15.0000 mg | ORAL_TABLET | Freq: Every day | ORAL | Status: DC
  Administered 2022-10-16: 15 mg via ORAL
  Filled 2022-10-16 (×2): qty 1

## 2022-10-16 MED ORDER — LISINOPRIL 10 MG PO TABS
10.0000 mg | ORAL_TABLET | Freq: Every day | ORAL | Status: DC
  Administered 2022-10-16 – 2022-10-17 (×2): 10 mg via ORAL
  Filled 2022-10-16 (×2): qty 1

## 2022-10-16 NOTE — Progress Notes (Signed)
VASCULAR LAB    Carotid duplex has been performed.  See CV proc for preliminary results.   Wilferd Ritson, RVT 10/16/2022, 1:40 PM

## 2022-10-16 NOTE — Progress Notes (Signed)
Alerted MD Palikh that patient was not eating or drinking despite repeated efforts to get her to do so.  MD ordered remeron medication for patient.    Alerted MD that patient was not having good UOP (133mLs out and bladder scan of 162 mLs).  MD ordered 250cc bolus of NS.    Will continue to assess and act accordingly.

## 2022-10-16 NOTE — Progress Notes (Addendum)
STROKE TEAM PROGRESS NOTE   INTERVAL HISTORY Repeat head CT  today is stable. PT/OT recommending SNF. Off cleviprex and PO BP medications started. Will transfer to floor and hospitalist service today.   Vitals:   10/16/22 0600 10/16/22 0630 10/16/22 0700 10/16/22 0800  BP: (!) 152/82 (!) 146/78 133/86 (!) 124/101  Pulse: 88 77 70 76  Resp: 19 12 10 15   Temp:    97.8 F (36.6 C)  TempSrc:    Oral  SpO2: 96% 95% 98% 100%   CBC:  Recent Labs  Lab 10/14/22 1429  WBC 5.6  NEUTROABS 3.3  HGB 13.2  HCT 40.2  MCV 86.6  PLT 053    Basic Metabolic Panel:  Recent Labs  Lab 10/14/22 1429  NA 137  K 4.5  CL 103  CO2 23  GLUCOSE 258*  BUN 32*  CREATININE 1.89*  CALCIUM 8.7*    Lipid Panel:  Recent Labs  Lab 10/15/22 1504 10/16/22 0610  CHOL 337*  --   TRIG 92 123  HDL 78  --   CHOLHDL 4.3  --   VLDL 18  --   LDLCALC 241*  --    HgbA1c:  Recent Labs  Lab 10/15/22 1504  HGBA1C 7.8*   Urine Drug Screen: No results for input(s): "LABOPIA", "COCAINSCRNUR", "LABBENZ", "AMPHETMU", "THCU", "LABBARB" in the last 168 hours.  Alcohol Level  Recent Labs  Lab 10/14/22 1429  ETH <10     IMAGING past 24 hours MR BRAIN W WO CONTRAST  Result Date: 10/15/2022 CLINICAL DATA:  Stroke follow-up EXAM: MRI HEAD WITHOUT AND WITH CONTRAST MRA HEAD WITHOUT CONTRAST TECHNIQUE: Multiplanar, multi-echo pulse sequences of the brain and surrounding structures were acquired without and with intravenous contrast. Angiographic images of the Circle of Willis were acquired using MRA technique without intravenous contrast. CONTRAST:  65mL GADAVIST GADOBUTROL 1 MMOL/ML IV SOLN COMPARISON:  CT Head 10/14/22 FINDINGS: MRI HEAD FINDINGS Brain: There are acute infarcts in the right basal ganglia, right frontal lobe, the posterior limb of the internal capsule on the right. There is also an acute infarct in the left frontal lobe adjacent to the left frontal horn (series 2, image 30). Redemonstrated is a  small focus of hemorrhage in the central pons (series 8, image 36). No other sites of hemorrhage are visualized. There is a chronic left occipital lobe infarct. Compared prior exam there are new regions of increased intrinsic T1 signal abnormality in the left temporal lobe, favored to represent cortical laminar necrosis. There is no evidence of an underlying contrast-enhancing lesion. Vascular: There is asymmetrically decreased arborization in the left MCA territory, in keeping with patient's history of left MCA territory infarct. Skull and upper cervical spine: Normal marrow signal. Sinuses/Orbits: No acute or significant finding. Other: None. MRA HEAD FINDINGS Anterior circulation: No LVO. No aneurysm. There is likely at least moderate stenosis in the cavernous segment of the left ICA (series 4, image 40). There are likely multifocal regions of high-grade stenosis in the proximal M2 segments of the left MCA. Likely moderate to severe stenosis in the distal A2 segment of the left ACA. Posterior circulation: Left PCA is occluded at the P1 P2 junction. Right PCA is normal in appearance. Anatomic variants: None IMPRESSION: 1. Acute infarcts in the right basal ganglia, right frontal lobe, and posterior limb of the internal capsule on the right. 2. Acute infarct in the left frontal lobe adjacent to the left frontal horn. 3. Redemonstrated focus of hemorrhage in the central pons,  likely unchagned from prior when accounting for differences in technique. 4. 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 No evidence of an underlying contrast-enhancing lesion. 5. Occlusion of the left PCA at the P1/P2 junction. 6. 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 7. Possible moderate to severe stenosis in the distal A2 segment of the left ACA. 8. Multifocal regions of high-grade stenosis in the proximal M2 segments of the  left MCA. These results will be called to the ordering clinician or representative by the Radiologist Assistant, and communication documented in the PACS or [employer]. Electronically Signed   By: Marin Roberts M.D.   On: 10/15/2022 13:51   MR ANGIO HEAD WO CONTRAST  Result Date: 10/15/2022 CLINICAL DATA:  Stroke follow-up EXAM: MRI HEAD WITHOUT AND WITH CONTRAST MRA HEAD WITHOUT CONTRAST TECHNIQUE: Multiplanar, multi-echo pulse sequences of the brain and surrounding structures were acquired without and with intravenous contrast. Angiographic images of the Circle of Willis were acquired using MRA technique without intravenous contrast. CONTRAST:  39mL GADAVIST GADOBUTROL 1 MMOL/ML IV SOLN COMPARISON:  CT Head 10/14/22 FINDINGS: MRI HEAD FINDINGS Brain: There are acute infarcts in the right basal ganglia, right frontal lobe, the posterior limb of the internal capsule on the right. There is also an acute infarct in the left frontal lobe adjacent to the left frontal horn (series 2, image 30). Redemonstrated is a small focus of hemorrhage in the central pons (series 8, image 36). No other sites of hemorrhage are visualized. There is a chronic left occipital lobe infarct. Compared prior exam there are new regions of increased intrinsic T1 signal abnormality in the left temporal lobe, favored to represent cortical laminar necrosis. There is no evidence of an underlying contrast-enhancing lesion. Vascular: There is asymmetrically decreased arborization in the left MCA territory, in keeping with patient's history of left MCA territory infarct. Skull and upper cervical spine: Normal marrow signal. Sinuses/Orbits: No acute or significant finding. Other: None. MRA HEAD FINDINGS Anterior circulation: No LVO. No aneurysm. There is likely at least moderate stenosis in the cavernous segment of the left ICA (series 4, image 40). There are likely multifocal regions of high-grade stenosis in the proximal M2 segments of the left  MCA. Likely moderate to severe stenosis in the distal A2 segment of the left ACA. Posterior circulation: Left PCA is occluded at the P1 P2 junction. Right PCA is normal in appearance. Anatomic variants: None IMPRESSION: 1. Acute infarcts in the right basal ganglia, right frontal lobe, and posterior limb of the internal capsule on the right. 2. Acute infarct in the left frontal lobe adjacent to the left frontal horn. 3. Redemonstrated focus of hemorrhage in the central pons, likely unchagned from prior when accounting for differences in technique. 4. 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 No evidence of an underlying contrast-enhancing lesion. 5. Occlusion of the left PCA at the P1/P2 junction. 6. 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 7. Possible moderate to severe stenosis in the distal A2 segment of the left ACA. 8. Multifocal regions of high-grade stenosis in the proximal M2 segments of the left MCA. These results will be called to the ordering clinician or representative by the Radiologist Assistant, and communication documented in the PACS or [employer]. Electronically Signed   By: Marin Olp.D.  On: 10/15/2022 13:51   ECHOCARDIOGRAM COMPLETE  Result Date: 10/15/2022    ECHOCARDIOGRAM REPORT   Patient Name:   [name] Gilbert Date of Exam: 10/15/2022 Medical Rec #:  [id number]        Height:       62.0 in Accession #:    [id number]       Weight:       156.3 lb Date of Birth:  06-01-1961        BSA:          1.722 m Patient Age:    62 years         BP:           145/70 mmHg Patient Gender: F                HR:           75 bpm. Exam Location:  Inpatient Procedure: 2D Echo, Cardiac Doppler and Color Doppler Indications:    I63.9 Stroke  History:        Patient has prior history of Echocardiogram examinations, most                 recent 01/25/2022. Stroke; Risk  Factors:Hypertension, Diabetes,                 Dyslipidemia and Non-Smoker.  Sonographer:    Wilkie Aye RVT RCS Referring Phys: [id number] Lorenza Chick  Sonographer Comments: Technically challenging study due to limited acoustic windows, Technically difficult study due to poor echo windows, suboptimal apical window and suboptimal subcostal window. Image acquisition challenging due to uncooperative patient. IMPRESSIONS  1. Left ventricular ejection fraction, by estimation, is 60 to 65%. The left ventricle has normal function. The left ventricle has no regional wall motion abnormalities. Left ventricular diastolic parameters are consistent with Grade I diastolic dysfunction (impaired relaxation).  2. Right ventricular systolic function is normal. The right ventricular size is not well visualized. Tricuspid regurgitation signal is inadequate for assessing PA pressure.  3. No evidence of mitral valve regurgitation.  4. The aortic valve is tricuspid. Aortic valve regurgitation is not visualized.  5. The inferior vena cava is normal in size with greater than 50% respiratory variability, suggesting right atrial pressure of 3 mmHg. FINDINGS  Left Ventricle: Left ventricular ejection fraction, by estimation, is 60 to 65%. The left ventricle has normal function. The left ventricle has no regional wall motion abnormalities. The left ventricular internal cavity size was normal in size. There is  no left ventricular hypertrophy. Left ventricular diastolic parameters are consistent with Grade I diastolic dysfunction (impaired relaxation). Right Ventricle: The right ventricular size is not well visualized. Right ventricular systolic function is normal. Tricuspid regurgitation signal is inadequate for assessing PA pressure. Left Atrium: Left atrial size was normal in size. Right Atrium: Right atrial size was normal in size. Pericardium: There is no evidence of pericardial effusion. Mitral Valve: No evidence of mitral valve  regurgitation. Tricuspid Valve: Tricuspid valve regurgitation is not demonstrated. Aortic Valve: The aortic valve is tricuspid. Aortic valve regurgitation is not visualized. Aortic valve mean gradient measures 3.0 mmHg. Aortic valve peak gradient measures 4.1 mmHg. Aortic valve area, by VTI measures 1.89 cm. Pulmonic Valve: Pulmonic valve regurgitation is not visualized. Venous: The inferior vena cava is normal in size with greater than 50% respiratory variability, suggesting right atrial pressure of 3 mmHg. IAS/Shunts: The interatrial septum was not well visualized.  LEFT VENTRICLE PLAX 2D LVIDd:  3.50 cm   Diastology LVIDs:         2.40 cm   LV e' medial:    4.74 cm/s LV PW:         0.80 cm   LV E/e' medial:  12.6 LV IVS:        0.90 cm   LV e' lateral:   5.42 cm/s LVOT diam:     1.60 cm   LV E/e' lateral: 11.0 LV SV:         37 LV SV Index:   22 LVOT Area:     2.01 cm  RIGHT VENTRICLE RV S prime:     5.72 cm/s LEFT ATRIUM             Index        RIGHT ATRIUM          Index LA diam:        2.80 cm 1.63 cm/m   RA Area:     8.15 cm LA Vol (A2C):   37.6 ml 21.84 ml/m  RA Volume:   15.30 ml 8.89 ml/m LA Vol (A4C):   33.1 ml 19.23 ml/m LA Biplane Vol: 36.4 ml 21.14 ml/m  AORTIC VALVE                    PULMONIC VALVE AV Area (Vmax):    1.64 cm     PV Vmax:       0.85 m/s AV Area (Vmean):   1.54 cm     PV Peak grad:  2.9 mmHg AV Area (VTI):     1.89 cm AV Vmax:           101.00 cm/s AV Vmean:          76.200 cm/s AV VTI:            0.197 m AV Peak Grad:      4.1 mmHg AV Mean Grad:      3.0 mmHg LVOT Vmax:         82.50 cm/s LVOT Vmean:        58.500 cm/s LVOT VTI:          0.185 m LVOT/AV VTI ratio: 0.94  AORTA Ao Root diam: 2.40 cm Ao Asc diam:  2.10 cm MITRAL VALVE MV Area (PHT): 3.23 cm    SHUNTS MV Decel Time: 235 msec    Systemic VTI:  0.18 m MV E velocity: 59.70 cm/s  Systemic Diam: 1.60 cm MV A velocity: 98.10 cm/s MV E/A ratio:  0.61 Landscape architect signed by Phineas Inches Signature  Date/Time: 10/15/2022/1:26:56 PM    Final     PHYSICAL EXAM Constitutional: Appears chronically ill  Psych: Affect pleasant, cooperative but confused Eyes: No scleral injection HENT: No oropharyngeal obstruction.  MSK: no joint deformities.  Cardiovascular: Normal rate and regular rhythm.  Respiratory: Effort normal, non-labored breathing GI: Soft.  No distension. There is no tenderness.  Skin: Warm dry and intact visible skin   Neuro: Mental Status: Patient is awake, alert. Oriented to self and place, not year. Soft voice.  Intermittently does not follow simple commands or mimic.  Cranial Nerves: II: Visual Fields are difficult to assess but blinks to threat bilaterally. Pupils are equal, round, and reactive to light, with noticeable cataracts bilaterally III,IV, VI: Tracks examiner throughout assessment.  VII: Facial movement is symmetric.  VIII: hearing is intact to voice X: Uvula difficult to visualize  Motor: RUE: 3/5.  RLE: 2/5  LUE: 4-/5, no drift LLE: 4-/5, no drift Sensory: Reactive to noxious stimulation throughout Cerebellar: Finger-nose intact within limits of weakness, slight ataxia LUE Gait:  Nonambulatory at baseline. Deferred assessment.   ASSESSMENT/PLAN Ms. Jamesetta Greenhalgh is a 62 y.o. woman with a past medical history significant for diabetes, hypertension, hyperlipidemia, prior strokes, visual impairment, CKD transferred [facility] on 2/4 . 24 hours of diffuse increasing weakness and lethargy, EMS called by her husband.  Per husband patient is not compliant with blood pressure medications as well as antiplatelet agents, aspirin and Plavix, at home.  Patient has history of previous strokes; limited speech and nonambulatory at baseline from previous strokes and recent foot injury. Patient also has significant memory impairment at baseline.   Her blood pressure was very high upon arrival and head CT revealed a small pontine hemorrhage  as well as multiple prior strokes.    Hemorrhagic Stroke Etiology: Likely hypertensive vs hemorrhagic conversion of ischemic stroke    CT head: New infarct in the right basal ganglia. Small hyperdense focus in the pons, suspicious for a small pontine hemorrhage. The tip of the basilar and the right MCA are slightly hyperdense, possibly artifactual. Repeat CT head: Unchanged subcentimeter focus of hemorrhage in the pons. 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  Head CT 2/5- pending   MRI/MRA: Acute infarcts in the right basal ganglia, right frontal lobe, and posterior limb of the internal capsule on the right. Acute infarct in the left frontal lobe adjacent to the left frontal horn. Re demonstrated focus of hemorrhage in the central pons, likely unchagned from prior when accounting for differences in technique. 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 No evidence of an underlying contrast-enhancing lesion. Occlusion of the left PCA at the P1/P2 junction. At least moderate stenosis in the cavernous segment of the left ICA Possible moderate to severe stenosis in the distal A2 segment of the left ACA. Multifocal regions of high-grade stenosis in the proximal M2 segments of the left MCA.    2D Echo:  LVEF 18-84%, Grade I diastolic dysfunction No MVR, AVR  Carotid Dopplers: pending LDL pending. HgbA1c pending VTE prophylaxis - DVT PPx heparin added (24hr imaging bleed stable, multiple infarcts seen)    Diet   Diet heart healthy/carb modified Room service appropriate? Yes with Assist; Fluid consistency: Thin   aspirin 81 mg daily prior to admission, now on No antithrombotic due to hemorrhagic stroke  Therapy recommendations:  pending Disposition:  pending  Hypertension Home meds:  none Blood pressure goal changed to SBP <160 d/t multiple infarcts seen on MRI.  Long-term BP goal  normotensive  Hyperlipidemia Home meds:  Crestor 40mg  LDL pending,  goal < 70 Continue statin at discharge  Diabetes type II Uncontrolled Home meds:  Lantus- resumed HgbA1c pending , goal < 7.0 CBGs Recent Labs    10/15/22 1550 10/15/22 1943 10/16/22 0726  GLUCAP 229* 232* 327*     [facility] Diabetes Coordinator Consult   Other Stroke Risk Factors  Hx stroke/TIA   Hospital day # 2  Patient seen and examined by NP/APP with MD. MD to update note as needed.   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  She appears to be near her baseline.  She has  had history of multiple strokes due to noncompliance.  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  repeat CT today looks improved.  Off cleviprex. Transfer out of ICU today. Consider adding antiplatelet tomorrow if stable.    Dr. Viviann Spare evaluated pt independently, reviewed imaging, chart, labs. Discussed and formulated plan with the Resident/APP. Changes were made to the note where appropriate. Please see APP/resident note above for details.    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 This patient's care requires constant monitoring of vital signs, hemodynamics, respiratory and cardiac monitoring, review of multiple databases, neurological assessment, discussion with family, other specialists and medical decision making of high complexity. I spent 35 minutes of neurocritical care time in the care of this patient.    Nialah Saravia,MD    To contact Stroke Continuity provider, please refer to [email or web address]. After hours, contact  General Neurology

## 2022-10-16 NOTE — Inpatient Diabetes Management (Signed)
Inpatient Diabetes Program Recommendations  AACE/ADA: New Consensus Statement on Inpatient Glycemic Control (2015)  Target Ranges:  Prepandial:   less than 140 mg/dL      Peak postprandial:   less than 180 mg/dL (1-2 hours)      Critically ill patients:  140 - 180 mg/dL   Lab Results  Component Value Date   GLUCAP 105 (H) 10/16/2022   HGBA1C 7.8 (H) 10/15/2022    Review of Glycemic Control    Latest Reference Range & Units 10/15/22 12:09 10/15/22 15:50 10/15/22 19:43 10/16/22 07:26 10/16/22 11:35  Glucose-Capillary 70 - 99 mg/dL 134 (H) 229 (H) 232 (H) 327 (H) 105 (H)  (H): Data is abnormally high Diabetes history: Type 2 DM Outpatient Diabetes medications: Lantus 13 units QD, Novolin Regular 0-12 units TID Current orders for Inpatient glycemic control: Semglee 15 units QD, Novolog 0-9 units TID & HS  Inpatient Diabetes Program Recommendations:    Noted consult. Spoke with patient briefly regarding outpatient diabetes management.  Reviewed patient's current A1c of 7.8%. Explained what a A1c is and what it measures. Also reviewed goal A1c with patient, importance of good glucose control @ home, and blood sugar goals.  Reports that home health aide gives her medications. Unable to verify how many injections per day. When asked additional questions but unable to answer appropriately. Noted plan for SNF. Will sign off.   Thanks, Bronson Curb, MSN, RNC-OB Diabetes Coordinator [phone] (8a-5p)

## 2022-10-16 NOTE — Evaluation (Signed)
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   Advanced care planning/counseling discussion 02/02/2022   Left thalamic infarction ([place]) 02/02/2022   Hyperkalemia 02/01/2022   Closed fracture of right distal fibula    Stroke ([place]) 01/25/2022   HLD (hyperlipidemia) 01/25/2022   Type II diabetes mellitus with renal manifestations ([place]) 01/25/2022   Stage 3b chronic kidney disease (CKD) ([place]) 01/25/2022   Chronic diastolic CHF (congestive heart failure) ([place]) 01/25/2022   Hypertensive emergency 01/25/2022   Elevated troponin 01/25/2022   Fall    Neurologic abnormality 06/13/2021   Hypoglycemia 06/13/2021   Hypothermia 06/13/2021   Dehydration 06/12/2021   Headache 06/12/2021   Abdominal pain 06/12/2021   Hypoglycemia associated with diabetes ([place]) 06/12/2021   Acute respiratory failure with hypoxia ([place])    HHNC (hyperglycemic hyperosmolar nonketotic coma) ([place]) 10/01/2019   HTN (hypertension) 11/01/2018   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 woman presenting to hospital with generalized weakness. CT: new infarct in the right basal ganglia and small hyperdense focus in the pons, suspicious for a small  pontine hemorrhage. 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  PHMx:diabetes, hypertension, hyperlipidemia, prior strokes, visual impairment, CKD, medication nonadherance   Assessment / Plan / Recommendation Clinical Impression  Pt demonstrates severe attention impairments. Pt is responsive to social language, able to greet, state her name with low volume. Pt attempts to follow one step commands but when asked to point to body parts, repeatedly points to lips. Cannot count without repetition as she is immediately distracted. Cannot name animals. Deficits are based on fleeting attention though language may have been impacted by prior CVA. Family not present to confirm    SLP Assessment  SLP Recommendation/Assessment: Patient needs continued Speech [place] [facility] SLP Visit Diagnosis: Cognitive communication deficit (R41.841)    Recommendations for follow up therapy are one component of a multi-disciplinary discharge planning process, led by the attending physician.  Recommendations may be updated based on patient status, additional functional criteria and insurance authorization.    Follow Up Recommendations  Skilled nursing-short term rehab (<3 hours/day)    Assistance Recommended at Discharge     Functional Status Assessment    Frequency and Duration min 2x/week  2 weeks      SLP Evaluation Cognition  Overall Cognitive Status: History of cognitive impairments - at baseline Arousal/Alertness: Awake/alert Orientation Level: Oriented to person;Disoriented to place;Disoriented to time;Disoriented to situation Attention: Focused;Sustained Focused Attention: Impaired Focused Attention Impairment: Verbal basic;Functional basic Sustained Attention: Impaired Sustained Attention Impairment: Verbal basic;Functional basic Problem Solving: Impaired Problem Solving Impairment: Verbal basic;Functional basic Executive Function: Initiating Initiating: Impaired Initiating Impairment: Verbal basic;Functional basic       Comprehension  Auditory  Comprehension Overall Auditory Comprehension: Impaired Yes/No Questions: Not tested Commands: Impaired One Step Basic Commands: 0-24% accurate  Interfering Components: Attention    Expression Verbal Expression Overall Verbal Expression: Impaired Initiation: Impaired Automatic Speech: Name;Counting Level of Generative/Spontaneous Verbalization: Word;Phrase Repetition: Impaired Level of Impairment: Word level   Oral / Motor  Oral Motor/Sensory Function Overall Oral Motor/Sensory Function: Within functional limits Motor Speech Overall Motor Speech: Impaired Phonation: Low vocal intensity            Darletta Noblett, Katherene Ponto 10/16/2022, 1:13 PM

## 2022-10-17 DIAGNOSIS — I613 Nontraumatic intracerebral hemorrhage in brain stem: Secondary | ICD-10-CM | POA: Diagnosis not present

## 2022-10-17 DIAGNOSIS — N184 Chronic kidney disease, stage 4 (severe): Secondary | ICD-10-CM

## 2022-10-17 DIAGNOSIS — I639 Cerebral infarction, unspecified: Secondary | ICD-10-CM

## 2022-10-17 DIAGNOSIS — I10 Essential (primary) hypertension: Secondary | ICD-10-CM

## 2022-10-17 LAB — CBC WITH DIFFERENTIAL/PLATELET
Abs Immature Granulocytes: 0.03 10*3/uL (ref 0.00–0.07)
Basophils Absolute: 0 10*3/uL (ref 0.0–0.1)
Basophils Relative: 0 %
Eosinophils Absolute: 0.1 10*3/uL (ref 0.0–0.5)
Eosinophils Relative: 2 %
HCT: 39 % (ref 36.0–46.0)
Hemoglobin: 12.6 g/dL (ref 12.0–15.0)
Immature Granulocytes: 1 %
Lymphocytes Relative: 40 %
Lymphs Abs: 2.5 10*3/uL (ref 0.7–4.0)
MCH: 28.4 pg (ref 26.0–34.0)
MCHC: 32.3 g/dL (ref 30.0–36.0)
MCV: 87.8 fL (ref 80.0–100.0)
Monocytes Absolute: 0.4 10*3/uL (ref 0.1–1.0)
Monocytes Relative: 6 %
Neutro Abs: 3.2 10*3/uL (ref 1.7–7.7)
Neutrophils Relative %: 51 %
Platelets: 291 10*3/uL (ref 150–400)
RBC: 4.44 MIL/uL (ref 3.87–5.11)
RDW: 15.7 % — ABNORMAL HIGH (ref 11.5–15.5)
WBC: 6.3 10*3/uL (ref 4.0–10.5)
nRBC: 0 % (ref 0.0–0.2)

## 2022-10-17 LAB — GLUCOSE, CAPILLARY
Glucose-Capillary: 139 mg/dL — ABNORMAL HIGH (ref 70–99)
Glucose-Capillary: 70 mg/dL (ref 70–99)
Glucose-Capillary: 126 mg/dL — ABNORMAL HIGH (ref 70–99)
Glucose-Capillary: 193 mg/dL — ABNORMAL HIGH (ref 70–99)
Glucose-Capillary: 83 mg/dL (ref 70–99)

## 2022-10-17 LAB — BASIC METABOLIC PANEL
Anion gap: 9 (ref 5–15)
BUN: 33 mg/dL — ABNORMAL HIGH (ref 8–23)
CO2: 24 mmol/L (ref 22–32)
Calcium: 8.6 mg/dL — ABNORMAL LOW (ref 8.9–10.3)
Chloride: 105 mmol/L (ref 98–111)
Creatinine, Ser: 2.39 mg/dL — ABNORMAL HIGH (ref 0.44–1.00)
GFR, Estimated: 22 mL/min — ABNORMAL LOW (ref >60)
Glucose, Bld: 140 mg/dL — ABNORMAL HIGH (ref 70–99)
Potassium: 4.1 mmol/L (ref 3.5–5.1)
Sodium: 138 mmol/L (ref 135–145)

## 2022-10-17 MED ORDER — ASPIRIN 81 MG PO TBEC
81.0000 mg | DELAYED_RELEASE_TABLET | Freq: Every day | ORAL | Status: DC
  Administered 2022-10-17 – 2022-10-25 (×8): 81 mg via ORAL
  Filled 2022-10-17 (×9): qty 1

## 2022-10-17 MED ORDER — LEVETIRACETAM 250 MG PO TABS
250.0000 mg | ORAL_TABLET | Freq: Two times a day (BID) | ORAL | Status: DC
  Administered 2022-10-17 – 2022-10-25 (×14): 250 mg via ORAL
  Filled 2022-10-17 (×17): qty 1

## 2022-10-17 MED ORDER — CLONIDINE HCL 0.1 MG PO TABS
0.1000 mg | ORAL_TABLET | Freq: Three times a day (TID) | ORAL | Status: DC | PRN
  Administered 2022-10-18: 0.1 mg via ORAL
  Filled 2022-10-17: qty 1

## 2022-10-17 MED ORDER — INSULIN GLARGINE-YFGN 100 UNIT/ML ~~LOC~~ SOLN: 5.0000 [IU] | Freq: Every day | SUBCUTANEOUS | Status: DC

## 2022-10-17 MED ORDER — DEXTROSE 50 % IV SOLN
INTRAVENOUS | Status: AC
  Administered 2022-10-17: 25 mL via INTRAVENOUS
  Filled 2022-10-17: qty 50

## 2022-10-17 MED ORDER — SODIUM CHLORIDE 0.9 % IV SOLN
250.0000 mg | Freq: Two times a day (BID) | INTRAVENOUS | Status: DC
  Administered 2022-10-17: 250 mg via INTRAVENOUS
  Filled 2022-10-17 (×3): qty 2.5

## 2022-10-17 MED ORDER — ROSUVASTATIN CALCIUM 20 MG PO TABS
40.0000 mg | ORAL_TABLET | Freq: Every day | ORAL | Status: DC
  Administered 2022-10-17 – 2022-10-22 (×4): 40 mg via ORAL
  Filled 2022-10-17 (×8): qty 2

## 2022-10-17 MED ORDER — DEXTROSE 50 % IV SOLN: 25.0000 mL | Freq: Once | INTRAVENOUS | Status: AC

## 2022-10-17 MED ORDER — SODIUM CHLORIDE 0.45 % IV SOLN: INTRAVENOUS | Status: AC

## 2022-10-17 MED ORDER — LEVETIRACETAM 250 MG PO TABS: 250.0000 mg | ORAL_TABLET | Freq: Two times a day (BID) | ORAL | Status: DC

## 2022-10-17 NOTE — Progress Notes (Signed)
Physical Therapy Treatment Patient Details Name: [name] Gilbert MRN: [id number] DOB: [birth year] Today's Date: 10/17/2022   History of Present Illness [name] Gilbert is a 62 y.o. woman presenting to hospital with generalized weakness. CT: new infarct in the right basal ganglia and small hyperdense focus in the pons, suspicious for a small  pontine hemorrhage. 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 PHMx:diabetes, hypertension, hyperlipidemia, prior strokes, visual impairment, CKD, medication nonadherance    PT Comments    Pt with flat affect and decreased interaction with PT.  Pt responding, "I don't know," when asked if she wants to participate. Pt displays increased RUE flexor and RLE extensor tone. Performed PROM prior to mobilizing. Pt requiring two person mod-max assist for functional mobility. Performing stand pivot transfer from bed to chair. Tendency for left lateral lean. Continue to recommend SNF for ongoing Physical Therapy.     Recommendations for follow up therapy are one component of a multi-disciplinary discharge planning process, led by the attending physician.  Recommendations may be updated based on patient status, additional functional criteria and insurance authorization.  Follow Up Recommendations  Skilled nursing-short term rehab (<3 hours/day) Can patient physically be transported by private vehicle: No   Assistance Recommended at Discharge Frequent or constant Supervision/Assistance  Patient can return home with the following Assist for transportation;Help with stairs or ramp for entrance;Two people to help with walking and/or transfers;A lot of help with bathing/dressing/bathroom   Equipment Recommendations  Other (comment) (defer)    Recommendations for Other Services       Precautions / Restrictions Precautions Precautions: Fall Restrictions Weight Bearing Restrictions: No     Mobility  Bed Mobility Overal bed mobility: Needs Assistance Bed Mobility: Rolling,  Sidelying to Sit Rolling: Max assist Sidelying to sit: Max assist, +2 for physical assistance       General bed mobility comments: assist for bending L knee, hand over hand guidance for placing L hand on railing, use of bed pad to roll hips into sidelying position. Assist for BLE's off edge of bed and trunk to sit upright    Transfers Overall transfer level: Needs assistance Equipment used: 2 person hand held assist Transfers: Bed to chair/wheelchair/BSC Sit to Stand: Mod assist, +2 physical assistance Stand pivot transfers: Mod assist, +2 physical assistance         General transfer comment: ModA + 2 to pivot towards right to chair, RLE in extension    Ambulation/Gait                   Stairs             Wheelchair Mobility    Modified Rankin (Stroke Patients Only) Modified Rankin (Stroke Patients Only) Pre-Morbid Rankin Score: Moderately severe disability Modified Rankin: Severe disability     Balance Overall balance assessment: Needs assistance Sitting-balance support: Feet supported Sitting balance-Leahy Scale: Poor Sitting balance - Comments: Left lateral lean, requires multimodal cueing to correct                                    Cognition Arousal/Alertness: Awake/alert Behavior During Therapy: Flat affect Overall Cognitive Status: History of cognitive impairments - at baseline                                 General Comments: Decreased interaction and verbal responses today. When asked if she wants  to participate in PT, pt stating, "I don't know." Cannot state age. Follows some one step commands with increased time. Depressed affect.        Exercises General Exercises - Lower Extremity Heel Slides: PROM, Right, 10 reps, Supine Hip ABduction/ADduction: PROM, Right, 10 reps, Supine Other Exercises Other Exercises: Supine: modified PROM D1/D2 RUE x 5-10 each    General Comments        Pertinent  Vitals/Pain Pain Assessment Pain Assessment: Faces Faces Pain Scale: No hurt    Home Living                          Prior Function            PT Goals (current goals can now be found in the care plan section) Acute Rehab PT Goals Patient Stated Goal: didn't state Potential to Achieve Goals: Good Progress towards PT goals: Progressing toward goals    Frequency    Min 2X/week      PT Plan Frequency needs to be updated    Co-evaluation              AM-PAC PT "6 Clicks" Mobility   Outcome Measure  Help needed turning from your back to your side while in a flat bed without using bedrails?: A Lot Help needed moving from lying on your back to sitting on the side of a flat bed without using bedrails?: Total Help needed moving to and from a bed to a chair (including a wheelchair)?: Total Help needed standing up from a chair using your arms (e.g., wheelchair or bedside chair)?: Total Help needed to walk in hospital room?: Total Help needed climbing 3-5 steps with a railing? : Total 6 Click Score: 7    End of Session Equipment Utilized During Treatment: Gait belt Activity Tolerance: Patient tolerated treatment well Patient left: in chair;with call bell/phone within reach;with chair alarm set Nurse Communication: Mobility status PT Visit Diagnosis: Unsteadiness on feet (R26.81);Muscle weakness (generalized) (M62.81)     Time: 0277-4128 PT Time Calculation (min) (ACUTE ONLY): 33 min  Charges:  $Therapeutic Activity: 23-37 mins                     Wyona Almas, PT, DPT Acute Rehabilitation Services Office [phone]    Deno Etienne 10/17/2022, 12:25 PM

## 2022-10-17 NOTE — Progress Notes (Signed)
SLP Cancellation Note  Patient Details Name: [name] Gilbert MRN: [id number] DOB: 1961-01-02   Cancelled treatment:       Orders received for swallowing assessment. Tray in front of her, minimally touched. Pt very politely and consistently declined any POs in order to complete swallow assessment, despite clinician's varied efforts.    RN reports similar difficulty getting pt to eat.  We will continue efforts.  [name] L. Tivis Ringer, MA CCC/SLP Clinical Specialist - Acute Care SLP Acute Rehabilitation Services Office number [phone]    Juan Quam Laurice 10/17/2022, 12:27 PM

## 2022-10-17 NOTE — Care Management Important Message (Signed)
Important Message  Patient Details  Name: [name] Gilbert MRN: [id number] Date of Birth: 1961-02-07   Medicare Important Message Given:  Yes     Nyeemah Jennette 10/17/2022, 3:03 PM

## 2022-10-17 NOTE — Progress Notes (Signed)
At bedside to place PIV, assessed left arm with ultrasound: no appropriate vein found to place PIV. Not a candidate for midline d/t medications patient receiving. R arm affected by previous stroke, is not usable. Reccommend PICC placement; primary RN aware and will notify MD.

## 2022-10-17 NOTE — Progress Notes (Signed)
Pts Iv infiltrated. 2 floor Rns attempted a total of 3x. IV consult placed. They stated they recommend patient have PICC line. RN informed Dr. [place] Pink and he stated patient is not a candidate d/t CKD. MD switched IV meds to oral and stated if patients intake does not improve we can place NGT.  Will continue to monitor patient and await further orders.

## 2022-10-17 NOTE — Progress Notes (Signed)
STROKE TEAM PROGRESS NOTE   INTERVAL HISTORY  Doing about the same.  She does not want to be here and wants to go home.  She is not eating much.  Remeron that was started yesterday made her drowsy this morning and stopped today by primary.     Vitals:   10/17/22 0334 10/17/22 0500 10/17/22 0723 10/17/22 1124  BP: 121/65  (!) 145/69 (!) 164/99  Pulse: (!) 59  66 69  Resp: 17  16 16   Temp: 98.1 F (36.7 C)  (!) 97.5 F (36.4 C) 97.6 F (36.4 C)  TempSrc: Oral  Oral Oral  SpO2: 100%  100%   Weight:  (P) 72.6 kg    Height:  (P) 5\' 2"  (1.575 m)     CBC:  Recent Labs  Lab 10/16/22 0842 10/17/22 0624  WBC 5.7 6.3  NEUTROABS 3.9 3.2  HGB 10.3* 12.6  HCT 31.8* 39.0  MCV 88.6 87.8  PLT 195 291    Basic Metabolic Panel:  Recent Labs  Lab 10/16/22 1132 10/17/22 0624  NA 139 138  K 5.8* 4.1  CL 109 105  CO2 18* 24  GLUCOSE 111* 140*  BUN 36* 33*  CREATININE 2.21* 2.39*  CALCIUM 8.8* 8.6*    Lipid Panel:  Recent Labs  Lab 10/15/22 1504 10/16/22 0610  CHOL 337*  --   TRIG 92 123  HDL 78  --   CHOLHDL 4.3  --   VLDL 18  --   LDLCALC 241*  --     HgbA1c:  Recent Labs  Lab 10/15/22 1504  HGBA1C 7.8*    Urine Drug Screen: No results for input(s): "LABOPIA", "COCAINSCRNUR", "LABBENZ", "AMPHETMU", "THCU", "LABBARB" in the last 168 hours.  Alcohol Level  Recent Labs  Lab 10/14/22 1429  ETH <10     IMAGING past 24 hours VAS 12/14/22 CAROTID  Result Date: 10/16/2022 Carotid Arterial Duplex Study Patient Name:  [name] Gilbert  Date of Exam:   10/16/2022 Medical Rec #: [name]         Accession #:    12/15/2022 Date of Birth: Dec 28, 1964         Patient Gender: F Patient Age:   62 years Exam Location:  [facility] Procedure:      VAS 64 CAROTID Referring Phys: [facility] --------------------------------------------------------------------------------  Indications:       CVA. Risk Factors:      Hypertension, hyperlipidemia, Diabetes, prior CVA. Other Factors:      Hypertensive emergency (240/120) secondary to medication                    noncompliance. Comparison Study:  No prior study Performing Technologist: [place] RVS  Examination Guidelines: A complete evaluation includes B-mode imaging, spectral Doppler, color Doppler, and power Doppler as needed of all accessible portions of each vessel. Bilateral testing is considered an integral part of a complete examination. Limited examinations for reoccurring indications may be performed as noted.  Right Carotid Findings: +----------+--------+--------+--------+------------------+------------------+           PSV cm/sEDV cm/sStenosisPlaque DescriptionComments           +----------+--------+--------+--------+------------------+------------------+ CCA Prox  90      9                                 intimal thickening +----------+--------+--------+--------+------------------+------------------+ CCA Distal75      19  intimal thickening +----------+--------+--------+--------+------------------+------------------+ ICA Prox  40      11              heterogenous                         +----------+--------+--------+--------+------------------+------------------+ ICA Mid   85      20                                                   +----------+--------+--------+--------+------------------+------------------+ ICA Distal56      14                                                   +----------+--------+--------+--------+------------------+------------------+ ECA       76      9                                                    +----------+--------+--------+--------+------------------+------------------+ +----------+--------+-------+--------+-------------------+           PSV cm/sEDV cmsDescribeArm Pressure (mmHG) +----------+--------+-------+--------+-------------------+ [id number]                                          +----------+--------+-------+--------+-------------------+ +---------+--------+--+--------+-+ VertebralPSV cm/s37EDV cm/s7 +---------+--------+--+--------+-+  Left Carotid Findings: +----------+--------+--------+--------+------------------+------------------+           PSV cm/sEDV cm/sStenosisPlaque DescriptionComments           +----------+--------+--------+--------+------------------+------------------+ CCA Prox  104     10                                intimal thickening +----------+--------+--------+--------+------------------+------------------+ CCA Distal72      13                                intimal thickening +----------+--------+--------+--------+------------------+------------------+ ICA Prox  52      11              heterogenous                         +----------+--------+--------+--------+------------------+------------------+ ICA Mid   48      12                                                   +----------+--------+--------+--------+------------------+------------------+ ICA Distal62      19                                                   +----------+--------+--------+--------+------------------+------------------+ ECA       111  7                                                    +----------+--------+--------+--------+------------------+------------------+ +----------+--------+--------+--------+-------------------+           PSV cm/sEDV cm/sDescribeArm Pressure (mmHG) +----------+--------+--------+--------+-------------------+ [id number]                                          +----------+--------+--------+--------+-------------------+ +---------+--------+--+--------+-+ VertebralPSV cm/s30EDV cm/s5 +---------+--------+--+--------+-+   Summary: Right Carotid: The extracranial vessels were near-normal with only minimal wall                thickening or plaque. Left Carotid: The extracranial vessels were near-normal  with only minimal wall               thickening or plaque. Vertebrals:  Bilateral vertebral arteries demonstrate antegrade flow. Subclavians: Normal flow hemodynamics were seen in bilateral subclavian              arteries. *See table(s) above for measurements and observations.     Preliminary     PHYSICAL EXAM Constitutional: Appears chronically ill  Psych: Affect pleasant, cooperative but confused Eyes: No scleral injection HENT: No oropharyngeal obstruction.  MSK: no joint deformities.  Cardiovascular: Normal rate and regular rhythm.  Respiratory: Effort normal, non-labored breathing GI: Soft.  No distension. There is no tenderness.  Skin: Warm dry and intact visible skin   Neuro: Mental Status: Sitting up in chair breakfast mostly untouched. Patient is awake, alert. Oriented to self and place, not year.  Intermittently does not follow simple commands or mimic.  Cranial Nerves: II: Visual Fields are difficult to assess but blinks to threat bilaterally. Pupils are equal, round, and reactive to light, with noticeable cataracts bilaterally III,IV, VI: Tracks examiner throughout assessment.  VII: Facial movement is symmetric.  VIII: hearing is intact to voice X: Uvula difficult to visualize  Motor: RUE: 3/5.  RLE: 2/5  LUE: 4-/5, no drift LLE: 4-/5, no drift  Cerebellar: Finger-nose intact within limits of weakness, slight ataxia LUE Gait:  Nonambulatory at baseline. Deferred assessment.   ASSESSMENT/PLAN Ms. [name] Gilbert is a 62 y.o. woman with a past medical history significant for diabetes, hypertension, hyperlipidemia, prior strokes, visual impairment, CKD transferred from [facility] on 2/4 . 24 hours of diffuse increasing weakness and lethargy, EMS called by her husband.  Per husband patient is not compliant with blood pressure medications as well as antiplatelet agents, aspirin and Plavix, at home.  Patient has history of previous strokes; limited  speech and nonambulatory at baseline from previous strokes and recent foot injury. Patient also has significant memory impairment at baseline.   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    Hemorrhagic Stroke Etiology: Likely hypertensive vs hemorrhagic conversion of ischemic stroke    CT head: New infarct in the right basal ganglia. Small hyperdense focus in the pons, suspicious for a small pontine hemorrhage. The tip of the basilar and the right MCA are slightly hyperdense, possibly artifactual. Repeat CT head: Unchanged subcentimeter focus of hemorrhage in the pons. 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  Head CT 2/5- pending   MRI/MRA: Acute infarcts in  the right basal ganglia, right frontal lobe, and posterior limb of the internal capsule on the right. Acute infarct in the left frontal lobe adjacent to the left frontal horn. Re demonstrated focus of hemorrhage in the central pons, likely unchagned from prior when accounting for differences in technique. 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 No evidence of an underlying contrast-enhancing lesion. Occlusion of the left PCA at the P1/P2 junction. At least moderate stenosis in the cavernous segment of the left ICA Possible moderate to severe stenosis in the distal A2 segment of the left ACA. Multifocal regions of high-grade stenosis in the proximal M2 segments of the left MCA.    2D Echo:  LVEF 09-81%, Grade I diastolic dysfunction No MVR, AVR  Carotid Dopplers: no stenosis. LDL 241 HgbA1c 7.8 goal <7 VTE prophylaxis - DVT PPx heparin added (24hr imaging bleed stable, multiple infarcts seen)    Diet   Diet heart healthy/carb modified Room service appropriate? Yes with Assist; Fluid consistency: Thin   aspirin 81 mg daily prior to admission, resume 81mg   aspirin. Therapy recommendations:  SNF Disposition:  pending  Hypertension Home meds:  none Blood pressure goal changed to SBP <160 d/t multiple infarcts seen on MRI.  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  She appears to be near her baseline.  She has had history of multiple strokes due to noncompliance.  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  Con't statin. Add aspirin 81mg  daily due to high risk of ischemic stroke and stable imaging/exam.  Stroke workup complete.   SNF pending.  Neurology will sign off please call with questions   Kimmi Acocella,MD    To contact Stroke Continuity provider, please refer to [email or web address] After hours, contact General Neurology

## 2022-10-17 NOTE — Plan of Care (Signed)
Problem: Education: Goal: Knowledge of disease or condition will improve 10/17/2022 1545 by Constance Haw, RN Outcome: Not Progressing 10/17/2022 1544 by Constance Haw, RN Outcome: Progressing Goal: Knowledge of secondary prevention will improve (MUST DOCUMENT ALL) 10/17/2022 1545 by Constance Haw, RN Outcome: Not Progressing 10/17/2022 1544 by Constance Haw, RN Outcome: Progressing Goal: Knowledge of patient specific risk factors will improve Elta [place] N/A or DELETE if not current risk factor) 10/17/2022 1545 by Constance Haw, RN Outcome: Not Progressing 10/17/2022 1544 by Constance Haw, RN Outcome: Progressing   Problem: Intracerebral Hemorrhage Tissue Perfusion: Goal: Complications of Intracerebral Hemorrhage will be minimized 10/17/2022 1545 by Constance Haw, RN Outcome: Not Progressing 10/17/2022 1544 by Constance Haw, RN Outcome: Progressing   Problem: Coping: Goal: Will verbalize positive feelings about self 10/17/2022 1545 by Constance Haw, RN Outcome: Not Progressing 10/17/2022 1544 by Constance Haw, RN Outcome: Progressing Goal: Will identify appropriate support needs 10/17/2022 1545 by Constance Haw, RN Outcome: Not Progressing 10/17/2022 1544 by Constance Haw, RN Outcome: Progressing   Problem: Health Behavior/Discharge Planning: Goal: Ability to manage health-related needs will improve 10/17/2022 1545 by Constance Haw, RN Outcome: Not Progressing 10/17/2022 1544 by Constance Haw, RN Outcome: Progressing Goal: Goals will be collaboratively established with patient/family 10/17/2022 1545 by Constance Haw, RN Outcome: Not Progressing 10/17/2022 1544 by Constance Haw, RN Outcome: Progressing   Problem: Self-Care: Goal: Ability to participate in self-care as condition permits will improve 10/17/2022 1545 by Constance Haw, RN Outcome: Not Progressing 10/17/2022 1544 by Constance Haw, RN Outcome: Progressing Goal: Verbalization of feelings and concerns over difficulty with  self-care will improve 10/17/2022 1545 by Constance Haw, RN Outcome: Not Progressing 10/17/2022 1544 by Constance Haw, RN Outcome: Progressing Goal: Ability to communicate needs accurately will improve 10/17/2022 1545 by Constance Haw, RN Outcome: Not Progressing 10/17/2022 1544 by Constance Haw, RN Outcome: Progressing   Problem: Nutrition: Goal: Risk of aspiration will decrease 10/17/2022 1545 by Constance Haw, RN Outcome: Not Progressing 10/17/2022 1544 by Constance Haw, RN Outcome: Progressing Goal: Dietary intake will improve 10/17/2022 1545 by Constance Haw, RN Outcome: Not Progressing 10/17/2022 1544 by Constance Haw, RN Outcome: Progressing   Problem: Education: Goal: Ability to describe self-care measures that may prevent or decrease complications (Diabetes Survival Skills Education) will improve 10/17/2022 1545 by Constance Haw, RN Outcome: Not Progressing 10/17/2022 1544 by Constance Haw, RN Outcome: Progressing Goal: Individualized Educational Video(s) 10/17/2022 1545 by Constance Haw, RN Outcome: Not Progressing 10/17/2022 1544 by Constance Haw, RN Outcome: Progressing   Problem: Coping: Goal: Ability to adjust to condition or change in health will improve 10/17/2022 1545 by Constance Haw, RN Outcome: Not Progressing 10/17/2022 1544 by Constance Haw, RN Outcome: Progressing   Problem: Fluid Volume: Goal: Ability to maintain a balanced intake and output will improve 10/17/2022 1545 by Constance Haw, RN Outcome: Not Progressing 10/17/2022 1544 by Constance Haw, RN Outcome: Progressing   Problem: Health Behavior/Discharge Planning: Goal: Ability to identify and utilize available resources and services will improve 10/17/2022 1545 by Constance Haw, RN Outcome: Not Progressing 10/17/2022 1544 by Constance Haw, RN Outcome: Progressing Goal: Ability to manage health-related needs will improve 10/17/2022 1545 by Constance Haw, RN Outcome: Not Progressing 10/17/2022 1544 by  Constance Haw, RN Outcome: Progressing   Problem: Metabolic: Goal: Ability to maintain appropriate glucose levels will improve 10/17/2022 1545 by Constance Haw, RN Outcome: Not Progressing 10/17/2022 1544 by Constance Haw, RN Outcome: Progressing   Problem: Nutritional: Goal: Maintenance of adequate nutrition will improve 10/17/2022 1545 by Constance Haw, RN Outcome: Not  Progressing 10/17/2022 1544 by Constance Haw, RN Outcome: Progressing Goal: Progress toward achieving an optimal weight will improve 10/17/2022 1545 by Constance Haw, RN Outcome: Not Progressing 10/17/2022 1544 by Constance Haw, RN Outcome: Progressing   Problem: Skin Integrity: Goal: Risk for impaired skin integrity will decrease 10/17/2022 1545 by Constance Haw, RN Outcome: Not Progressing 10/17/2022 1544 by Constance Haw, RN Outcome: Progressing   Problem: Tissue Perfusion: Goal: Adequacy of tissue perfusion will improve 10/17/2022 1545 by Constance Haw, RN Outcome: Not Progressing 10/17/2022 1544 by Constance Haw, RN Outcome: Progressing   Problem: Education: Goal: Knowledge of General Education information will improve Description: Including pain rating scale, medication(s)/side effects and non-pharmacologic comfort measures 10/17/2022 1545 by Constance Haw, RN Outcome: Not Progressing 10/17/2022 1544 by Constance Haw, RN Outcome: Progressing   Problem: Health Behavior/Discharge Planning: Goal: Ability to manage health-related needs will improve 10/17/2022 1545 by Constance Haw, RN Outcome: Not Progressing 10/17/2022 1544 by Constance Haw, RN Outcome: Progressing   Problem: Clinical Measurements: Goal: Ability to maintain clinical measurements within normal limits will improve 10/17/2022 1545 by Constance Haw, RN Outcome: Not Progressing 10/17/2022 1544 by Constance Haw, RN Outcome: Progressing Goal: Will remain free from infection 10/17/2022 1545 by Constance Haw, RN Outcome: Not Progressing 10/17/2022  1544 by Constance Haw, RN Outcome: Progressing Goal: Diagnostic test results will improve 10/17/2022 1545 by Constance Haw, RN Outcome: Not Progressing 10/17/2022 1544 by Constance Haw, RN Outcome: Progressing Goal: Respiratory complications will improve 10/17/2022 1545 by Constance Haw, RN Outcome: Not Progressing 10/17/2022 1544 by Constance Haw, RN Outcome: Progressing Goal: Cardiovascular complication will be avoided 10/17/2022 1545 by Constance Haw, RN Outcome: Not Progressing 10/17/2022 1544 by Constance Haw, RN Outcome: Progressing   Problem: Activity: Goal: Risk for activity intolerance will decrease 10/17/2022 1545 by Constance Haw, RN Outcome: Not Progressing 10/17/2022 1544 by Constance Haw, RN Outcome: Progressing   Problem: Nutrition: Goal: Adequate nutrition will be maintained 10/17/2022 1545 by Constance Haw, RN Outcome: Not Progressing 10/17/2022 1544 by Constance Haw, RN Outcome: Progressing   Problem: Coping: Goal: Level of anxiety will decrease 10/17/2022 1545 by Constance Haw, RN Outcome: Not Progressing 10/17/2022 1544 by Constance Haw, RN Outcome: Progressing   Problem: Elimination: Goal: Will not experience complications related to bowel motility 10/17/2022 1545 by Constance Haw, RN Outcome: Not Progressing 10/17/2022 1544 by Constance Haw, RN Outcome: Progressing Goal: Will not experience complications related to urinary retention 10/17/2022 1545 by Constance Haw, RN Outcome: Not Progressing 10/17/2022 1544 by Constance Haw, RN Outcome: Progressing   Problem: Pain Managment: Goal: General experience of comfort will improve 10/17/2022 1545 by Constance Haw, RN Outcome: Not Progressing 10/17/2022 1544 by Constance Haw, RN Outcome: Progressing

## 2022-10-17 NOTE — Progress Notes (Addendum)
[facility] PROGRESS NOTE   [name] Gilbert NUU:[id number] DOB: October 05, 1960 DOA: 10/14/2022  PCP: [name] Scheuermann, MD  Brief History/Interval Summary:  62 y.o. woman with a past medical history significant for diabetes, hypertension, hyperlipidemia, prior strokes, visual impairment, CKD transferred from [facility] on 2/4 after presenting with 24 hours of diffuse increasing weakness and lethargy, EMS called by her husband.  Per husband patient is not compliant with blood pressure medications as well as antiplatelet agents, aspirin and Plavix, at home.  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    Consultants: Neurology  Procedures: Echocardiogram.  Carotid Dopplers    Subjective/Interval History: Patient not very communicative but denies any headaches.  Noted to be lethargic.    Assessment/Plan:  Acute CVA hemorrhagic and ischemic  Patient was seen by neurology.  Blood pressure was brought under control.  CT head was repeated.  No worsening of the hemorrhage was noted. MRI also showed acute infarcts. Echocardiogram shows normal systolic function LDL 347.  Patient not noted to be on any statin at this time.  She has allergy listed to statins.  Apparently had myopathy with atorvastatin.  But looks like patient was on rosuvastatin prior to admission but may not have been compliant with this medication. HbA1c 7.8. Seen by PT and OT.  SNF is recommended for short-term rehab. Defer antiplatelet agents to neurology.  Essential hypertension Noted to be on amlodipine, spironolactone and lisinopril.  Monitor blood pressures closely. Patient was on Cleviprex in the ICU. Stop spironolactone and lisinopril due to rise in creatinine. Clonidine as needed.  Hyperlipidemia LDL levels 241.  Patient has not been compliant with her home medications.  Resume rosuvastatin.  Poor  oral intake Encourage oral intake.  Request speech therapy to evaluate her swallow function considering her acute stroke. If her oral intake does not improve she may need nasogastric tube feedings.  Questionable history of seizure disorder Noted to be on Keppra prior to admission which is being continued.  Diabetes mellitus, uncontrolled with hyperglycemia Continue with glargine and SSI.  HbA1c 7.8.  Monitor CBGs.  Chronic kidney disease stage IV Baseline creatinine is between 2.2-2.6.  Renal function seems to be close to baseline.  She is noted to be on ACE inhibitor and spironolactone.  Monitor renal function closely.  Monitor urine output.  Avoid nephrotoxic agents.   DVT Prophylaxis: Subcutaneous heparin Code Status: DNR Family Communication: No family at bedside Disposition Plan: Skilled nursing facility for rehab when medically cleared  Status is: Inpatient Remains inpatient appropriate because: Acute stroke    Medications: Scheduled:  amLODipine  10 mg Oral Daily   Chlorhexidine Gluconate Cloth  6 each Topical Daily   heparin injection (subcutaneous)  5,000 Units Subcutaneous Q8H   insulin aspart  0-5 Units Subcutaneous QHS   insulin aspart  0-9 Units Subcutaneous TID WC   insulin glargine-yfgn  15 Units Subcutaneous Daily   lisinopril  10 mg Oral Daily   pantoprazole (PROTONIX) IV  40 mg Intravenous QHS   senna-docusate  1 tablet Oral BID   spironolactone  50 mg Oral Daily   Continuous:  sodium chloride 75 mL/hr at 10/17/22 0916   levETIRAcetam 250 mg (10/17/22 0056)   QQV:ZDGLOVFIEPPIR **OR** acetaminophen (TYLENOL) oral liquid 160 mg/5 mL **OR** acetaminophen, labetalol  Antibiotics: Anti-infectives (From admission, onward)    None       Objective:  Vital Signs  Vitals:   10/17/22 0002 10/17/22 0334 10/17/22 0500 10/17/22 0723  BP: 121/61 121/65  (!) 145/69  Pulse: 63 (!) 59  66  Resp: 17 17  16   Temp: 98.4 F (36.9 C) 98.1 F (36.7 C)  (!) 97.5  F (36.4 C)  TempSrc: Oral Oral  Oral  SpO2: 100% 100%  100%  Weight:   (P) 72.6 kg   Height:   (P) 5\' 2"  (1.575 m)     Intake/Output Summary (Last 24 hours) at 10/17/2022 0954 Last data filed at 10/16/2022 1600 Gross per 24 hour  Intake 250.39 ml  Output 100 ml  Net 150.39 ml   Filed Weights   10/17/22 0500  Weight: (P) 72.6 kg    General appearance: Awake alert.  In no distress.  Not very communicative.  Slightly lethargic. Resp: Poor effort.  Poor air entry at the bases.  No crackles wheezing or rhonchi. Cardio: S1-S2 is normal regular.  No S3-S4.  No rubs murmurs or bruit GI: Abdomen is soft.  Nontender nondistended.  Bowel sounds are present normal.  No masses organomegaly Extremities: No edema.  Physical deconditioning noted.   Lab Results:  Data Reviewed: I have personally reviewed following labs and reports of the imaging studies  CBC: Recent Labs  Lab 10/14/22 1429 10/16/22 0842 10/17/22 0624  WBC 5.6 5.7 6.3  NEUTROABS 3.3 3.9 3.2  HGB 13.2 10.3* 12.6  HCT 40.2 31.8* 39.0  MCV 86.6 88.6 87.8  PLT 291 195 291    Basic Metabolic Panel: Recent Labs  Lab 10/14/22 1429 10/16/22 1132 10/17/22 0624  NA 137 139 138  K 4.5 5.8* 4.1  CL 103 109 105  CO2 23 18* 24  GLUCOSE 258* 111* 140*  BUN 32* 36* 33*  CREATININE 1.89* 2.21* 2.39*  CALCIUM 8.7* 8.8* 8.6*    GFR: Estimated Creatinine Clearance: 22.2 mL/min (A) (by C-G formula based on SCr of 2.39 mg/dL (H)).  Liver Function Tests: Recent Labs  Lab 10/14/22 1429  AST 20  ALT 10  ALKPHOS 84  BILITOT 1.5*  PROT 9.2*  ALBUMIN 3.4*     HbA1C: Recent Labs    10/15/22 1504  HGBA1C 7.8*    CBG: Recent Labs  Lab 10/16/22 0726 10/16/22 1135 10/16/22 1708 10/16/22 2247 10/17/22 0624  GLUCAP 327* 105* 150* 228* 139*    Lipid Profile: Recent Labs    10/15/22 1504 10/16/22 0610  CHOL 337*  --   HDL 78  --   LDLCALC 241*  --   TRIG 92 123  CHOLHDL 4.3  --      Anemia  Panel: Recent Labs    10/15/22 0408 10/15/22 0415  VITAMINB12 301  --   FOLATE  --  7.6   Radiology Studies: VAS 12/14/22 CAROTID  Result Date: 10/16/2022 Carotid Arterial Duplex Study Patient Name:  [name] Gilbert  Date of Exam:   10/16/2022 Medical Rec #: [name] Gilbert         Accession #:    [id number] Date of Birth: [birth year]         Patient Gender: F Patient Age:   67 years Exam Location:  [facility] Procedure:      VAS 64 CAROTID Referring Phys: [facility] --------------------------------------------------------------------------------  Indications:       CVA. Risk Factors:      Hypertension, hyperlipidemia, Diabetes, prior CVA. Other Factors:     Hypertensive emergency (240/120) secondary to medication  noncompliance. Comparison Study:  No prior study Performing Technologist: Sherren Kerns RVS  Examination Guidelines: A complete evaluation includes B-mode imaging, spectral Doppler, color Doppler, and power Doppler as needed of all accessible portions of each vessel. Bilateral testing is considered an integral part of a complete examination. Limited examinations for reoccurring indications may be performed as noted.  Right Carotid Findings: +----------+--------+--------+--------+------------------+------------------+           PSV cm/sEDV cm/sStenosisPlaque DescriptionComments           +----------+--------+--------+--------+------------------+------------------+ CCA Prox  90      9                                 intimal thickening +----------+--------+--------+--------+------------------+------------------+ CCA Distal75      19                                intimal thickening +----------+--------+--------+--------+------------------+------------------+ ICA Prox  40      11              heterogenous                         +----------+--------+--------+--------+------------------+------------------+ ICA Mid   85      20                                                    +----------+--------+--------+--------+------------------+------------------+ ICA Distal56      14                                                   +----------+--------+--------+--------+------------------+------------------+ ECA       76      9                                                    +----------+--------+--------+--------+------------------+------------------+ +----------+--------+-------+--------+-------------------+           PSV cm/sEDV cmsDescribeArm Pressure (mmHG) +----------+--------+-------+--------+-------------------+ [id number]                                         +----------+--------+-------+--------+-------------------+ +---------+--------+--+--------+-+ VertebralPSV cm/s37EDV cm/s7 +---------+--------+--+--------+-+  Left Carotid Findings: +----------+--------+--------+--------+------------------+------------------+           PSV cm/sEDV cm/sStenosisPlaque DescriptionComments           +----------+--------+--------+--------+------------------+------------------+ CCA Prox  104     10                                intimal thickening +----------+--------+--------+--------+------------------+------------------+ CCA Distal72      13                                intimal thickening +----------+--------+--------+--------+------------------+------------------+ ICA Prox  52  11              heterogenous                         +----------+--------+--------+--------+------------------+------------------+ ICA Mid   48      12                                                   +----------+--------+--------+--------+------------------+------------------+ ICA Distal62      19                                                   +----------+--------+--------+--------+------------------+------------------+ ECA       111     7                                                     +----------+--------+--------+--------+------------------+------------------+ +----------+--------+--------+--------+-------------------+           PSV cm/sEDV cm/sDescribeArm Pressure (mmHG) +----------+--------+--------+--------+-------------------+ [id number]                                          +----------+--------+--------+--------+-------------------+ +---------+--------+--+--------+-+ VertebralPSV cm/s30EDV cm/s5 +---------+--------+--+--------+-+   Summary: Right Carotid: The extracranial vessels were near-normal with only minimal wall                thickening or plaque. Left Carotid: The extracranial vessels were near-normal with only minimal wall               thickening or plaque. Vertebrals:  Bilateral vertebral arteries demonstrate antegrade flow. Subclavians: Normal flow hemodynamics were seen in bilateral subclavian              arteries. *See table(s) above for measurements and observations.     Preliminary    CT HEAD WO CONTRAST (5MM)  Result Date: 10/16/2022 CLINICAL DATA:  Stroke, follow-up. EXAM: CT HEAD WITHOUT CONTRAST TECHNIQUE: Contiguous axial images were obtained from the base of the skull through the vertex without intravenous contrast. RADIATION DOSE REDUCTION: This exam was performed according to the departmental dose-optimization program which includes automated exposure control, adjustment of the mA and/or kV according to patient size and/or use of iterative reconstruction technique. COMPARISON:  Head CT October 14, 2022; MRI brain October 15, 2022. FINDINGS: Brain: Again seen are areas of hypodensity in the right basal ganglia, the right frontal and left periventricular white matter as well as posterior limb of the right internal capsule, better seen on prior MRI. There is no evidence of hemorrhagic transformation or significant mass effect. The large remote left PCA territory infarct involving the left thalamus, insula and left temporal occipital region.  Small remote right occipital lobe infarct. Confluent hypodensity of the white matter of the cerebral hemispheres, suggestive of chronic microangiopathy. Infratentorially, focus of hyperdensity is again seen in the pons, slightly less evident than on prior CT. No new large acute territory infarct, new focus of hemorrhage, hydrocephalus, extra-axial collection or mass lesion. Vascular:  Calcified plaques in the bilateral carotid siphons and intracranial vertebral arteries. Skull: Normal. Negative for fracture or focal lesion. Sinuses/Orbits: Minimal amount of fluid in the right maxillary sinus. The orbits are maintained. Other: None. IMPRESSION: 1. Evolving infarcts in the right basal ganglia, right frontal and left periventricular white matter as well as posterior limb of the right internal capsule, better seen on prior MRI. No evidence of hemorrhagic transformation or significant mass effect. 2. Small pontine hemorrhage, slightly less evident than on prior CT. 3. No new large acute territory infarct or new focus of hemorrhage. 4. Remote left PCA territory infarct and small remote right occipital lobe infarct. 5. Chronic microangiopathy. Electronically Signed   By: Pedro Earls M.D.   On: 10/16/2022 13:35   MR BRAIN W WO CONTRAST  Result Date: 10/15/2022 CLINICAL DATA:  Stroke follow-up EXAM: MRI HEAD WITHOUT AND WITH CONTRAST MRA HEAD WITHOUT CONTRAST TECHNIQUE: Multiplanar, multi-echo pulse sequences of the brain and surrounding structures were acquired without and with intravenous contrast. Angiographic images of the Circle of Willis were acquired using MRA technique without intravenous contrast. CONTRAST:  37mL GADAVIST GADOBUTROL 1 MMOL/ML IV SOLN COMPARISON:  CT Head 10/14/22 FINDINGS: MRI HEAD FINDINGS Brain: There are acute infarcts in the right basal ganglia, right frontal lobe, the posterior limb of the internal capsule on the right. There is also an acute infarct in the left frontal lobe  adjacent to the left frontal horn (series 2, image 30). Redemonstrated is a small focus of hemorrhage in the central pons (series 8, image 36). No other sites of hemorrhage are visualized. There is a chronic left occipital lobe infarct. Compared prior exam there are new regions of increased intrinsic T1 signal abnormality in the left temporal lobe, favored to represent cortical laminar necrosis. There is no evidence of an underlying contrast-enhancing lesion. Vascular: There is asymmetrically decreased arborization in the left MCA territory, in keeping with patient's history of left MCA territory infarct. Skull and upper cervical spine: Normal marrow signal. Sinuses/Orbits: No acute or significant finding. Other: None. MRA HEAD FINDINGS Anterior circulation: No LVO. No aneurysm. There is likely at least moderate stenosis in the cavernous segment of the left ICA (series 4, image 40). There are likely multifocal regions of high-grade stenosis in the proximal M2 segments of the left MCA. Likely moderate to severe stenosis in the distal A2 segment of the left ACA. Posterior circulation: Left PCA is occluded at the P1 P2 junction. Right PCA is normal in appearance. Anatomic variants: None IMPRESSION: 1. Acute infarcts in the right basal ganglia, right frontal lobe, and posterior limb of the internal capsule on the right. 2. Acute infarct in the left frontal lobe adjacent to the left frontal horn. 3. Redemonstrated focus of hemorrhage in the central pons, likely unchagned from prior when accounting for differences in technique. 4. 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 No evidence of an underlying contrast-enhancing lesion. 5. Occlusion of the left PCA at the P1/P2 junction. 6. 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 7. Possible moderate to severe stenosis in the distal A2 segment of the left ACA. 8.  Multifocal regions of high-grade stenosis in the proximal M2 segments of the left MCA. These results will be called to the ordering clinician or representative by the Radiologist Assistant, and communication documented in the PACS or [employer]. Electronically Signed   By: Marin Roberts  M.D.   On: 10/15/2022 13:51   MR ANGIO HEAD WO CONTRAST  Result Date: 10/15/2022 CLINICAL DATA:  Stroke follow-up EXAM: MRI HEAD WITHOUT AND WITH CONTRAST MRA HEAD WITHOUT CONTRAST TECHNIQUE: Multiplanar, multi-echo pulse sequences of the brain and surrounding structures were acquired without and with intravenous contrast. Angiographic images of the Circle of Willis were acquired using MRA technique without intravenous contrast. CONTRAST:  35mL GADAVIST GADOBUTROL 1 MMOL/ML IV SOLN COMPARISON:  CT Head 10/14/22 FINDINGS: MRI HEAD FINDINGS Brain: There are acute infarcts in the right basal ganglia, right frontal lobe, the posterior limb of the internal capsule on the right. There is also an acute infarct in the left frontal lobe adjacent to the left frontal horn (series 2, image 30). Redemonstrated is a small focus of hemorrhage in the central pons (series 8, image 36). No other sites of hemorrhage are visualized. There is a chronic left occipital lobe infarct. Compared prior exam there are new regions of increased intrinsic T1 signal abnormality in the left temporal lobe, favored to represent cortical laminar necrosis. There is no evidence of an underlying contrast-enhancing lesion. Vascular: There is asymmetrically decreased arborization in the left MCA territory, in keeping with patient's history of left MCA territory infarct. Skull and upper cervical spine: Normal marrow signal. Sinuses/Orbits: No acute or significant finding. Other: None. MRA HEAD FINDINGS Anterior circulation: No LVO. No aneurysm. There is likely at least moderate stenosis in the cavernous segment of the left ICA (series 4, image 40). There are likely  multifocal regions of high-grade stenosis in the proximal M2 segments of the left MCA. Likely moderate to severe stenosis in the distal A2 segment of the left ACA. Posterior circulation: Left PCA is occluded at the P1 P2 junction. Right PCA is normal in appearance. Anatomic variants: None IMPRESSION: 1. Acute infarcts in the right basal ganglia, right frontal lobe, and posterior limb of the internal capsule on the right. 2. Acute infarct in the left frontal lobe adjacent to the left frontal horn. 3. Redemonstrated focus of hemorrhage in the central pons, likely unchagned from prior when accounting for differences in technique. 4. 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 No evidence of an underlying contrast-enhancing lesion. 5. Occlusion of the left PCA at the P1/P2 junction. 6. 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 7. Possible moderate to severe stenosis in the distal A2 segment of the left ACA. 8. Multifocal regions of high-grade stenosis in the proximal M2 segments of the left MCA. These results will be called to the ordering clinician or representative by the Radiologist Assistant, and communication documented in the PACS or [employer]. Electronically Signed   By: Lorenza Cambridge M.D.   On: 10/15/2022 13:51   ECHOCARDIOGRAM COMPLETE  Result Date: 10/15/2022    ECHOCARDIOGRAM REPORT   Patient Name:   [name] Gilbert Date of Exam: 10/15/2022 Medical Rec #:  [id number]        Height:       62.0 in Accession #:    [id number]       Weight:       156.3 lb Date of Birth:  20-Aug-1961        BSA:          1.722 m Patient Age:    61 years         BP:           145/70 mmHg  Patient Gender: F                HR:           75 bpm. Exam Location:  Inpatient Procedure: 2D Echo, Cardiac Doppler and Color Doppler Indications:    I63.9 Stroke  History:        Patient has prior history of Echocardiogram  examinations, most                 recent 01/25/2022. Stroke; Risk Factors:Hypertension, Diabetes,                 Dyslipidemia and Non-Smoker.  Sonographer:    Wilkie Aye RVT RCS Referring Phys: [id number] Lorenza Chick  Sonographer Comments: Technically challenging study due to limited acoustic windows, Technically difficult study due to poor echo windows, suboptimal apical window and suboptimal subcostal window. Image acquisition challenging due to uncooperative patient. IMPRESSIONS  1. Left ventricular ejection fraction, by estimation, is 60 to 65%. The left ventricle has normal function. The left ventricle has no regional wall motion abnormalities. Left ventricular diastolic parameters are consistent with Grade I diastolic dysfunction (impaired relaxation).  2. Right ventricular systolic function is normal. The right ventricular size is not well visualized. Tricuspid regurgitation signal is inadequate for assessing PA pressure.  3. No evidence of mitral valve regurgitation.  4. The aortic valve is tricuspid. Aortic valve regurgitation is not visualized.  5. The inferior vena cava is normal in size with greater than 50% respiratory variability, suggesting right atrial pressure of 3 mmHg. FINDINGS  Left Ventricle: Left ventricular ejection fraction, by estimation, is 60 to 65%. The left ventricle has normal function. The left ventricle has no regional wall motion abnormalities. The left ventricular internal cavity size was normal in size. There is  no left ventricular hypertrophy. Left ventricular diastolic parameters are consistent with Grade I diastolic dysfunction (impaired relaxation). Right Ventricle: The right ventricular size is not well visualized. Right ventricular systolic function is normal. Tricuspid regurgitation signal is inadequate for assessing PA pressure. Left Atrium: Left atrial size was normal in size. Right Atrium: Right atrial size was normal in size. Pericardium: There is no evidence of  pericardial effusion. Mitral Valve: No evidence of mitral valve regurgitation. Tricuspid Valve: Tricuspid valve regurgitation is not demonstrated. Aortic Valve: The aortic valve is tricuspid. Aortic valve regurgitation is not visualized. Aortic valve mean gradient measures 3.0 mmHg. Aortic valve peak gradient measures 4.1 mmHg. Aortic valve area, by VTI measures 1.89 cm. Pulmonic Valve: Pulmonic valve regurgitation is not visualized. Venous: The inferior vena cava is normal in size with greater than 50% respiratory variability, suggesting right atrial pressure of 3 mmHg. IAS/Shunts: The interatrial septum was not well visualized.  LEFT VENTRICLE PLAX 2D LVIDd:         3.50 cm   Diastology LVIDs:         2.40 cm   LV e' medial:    4.74 cm/s LV PW:         0.80 cm   LV E/e' medial:  12.6 LV IVS:        0.90 cm   LV e' lateral:   5.42 cm/s LVOT diam:     1.60 cm   LV E/e' lateral: 11.0 LV SV:         37 LV SV Index:   22 LVOT Area:     2.01 cm  RIGHT VENTRICLE RV S prime:     5.72 cm/s LEFT ATRIUM  Index        RIGHT ATRIUM          Index LA diam:        2.80 cm 1.63 cm/m   RA Area:     8.15 cm LA Vol (A2C):   37.6 ml 21.84 ml/m  RA Volume:   15.30 ml 8.89 ml/m LA Vol (A4C):   33.1 ml 19.23 ml/m LA Biplane Vol: 36.4 ml 21.14 ml/m  AORTIC VALVE                    PULMONIC VALVE AV Area (Vmax):    1.64 cm     PV Vmax:       0.85 m/s AV Area (Vmean):   1.54 cm     PV Peak grad:  2.9 mmHg AV Area (VTI):     1.89 cm AV Vmax:           101.00 cm/s AV Vmean:          76.200 cm/s AV VTI:            0.197 m AV Peak Grad:      4.1 mmHg AV Mean Grad:      3.0 mmHg LVOT Vmax:         82.50 cm/s LVOT Vmean:        58.500 cm/s LVOT VTI:          0.185 m LVOT/AV VTI ratio: 0.94  AORTA Ao Root diam: 2.40 cm Ao Asc diam:  2.10 cm MITRAL VALVE MV Area (PHT): 3.23 cm    SHUNTS MV Decel Time: 235 msec    Systemic VTI:  0.18 m MV E velocity: 59.70 cm/s  Systemic Diam: 1.60 cm MV A velocity: 98.10 cm/s MV E/A ratio:   0.61 [place] Electronically signed by Carolan ClinesMary Branch Signature Date/Time: 10/15/2022/1:26:56 PM    Final        LOS: 3 days   Osvaldo ShipperGokul Terral Cooks  [facility] Pager on [email or web address]  10/17/2022, 9:54 AM

## 2022-10-18 ENCOUNTER — Other Ambulatory Visit: Payer: Self-pay

## 2022-10-18 DIAGNOSIS — I613 Nontraumatic intracerebral hemorrhage in brain stem: Secondary | ICD-10-CM | POA: Diagnosis not present

## 2022-10-18 LAB — BASIC METABOLIC PANEL
Anion gap: 10 (ref 5–15)
BUN: 31 mg/dL — ABNORMAL HIGH (ref 8–23)
CO2: 23 mmol/L (ref 22–32)
Calcium: 8.2 mg/dL — ABNORMAL LOW (ref 8.9–10.3)
Chloride: 103 mmol/L (ref 98–111)
Creatinine, Ser: 2.31 mg/dL — ABNORMAL HIGH (ref 0.44–1.00)
GFR, Estimated: 23 mL/min — ABNORMAL LOW (ref >60)
Glucose, Bld: 199 mg/dL — ABNORMAL HIGH (ref 70–99)
Potassium: 4.9 mmol/L (ref 3.5–5.1)
Sodium: 136 mmol/L (ref 135–145)

## 2022-10-18 LAB — GLUCOSE, CAPILLARY
Glucose-Capillary: 186 mg/dL — ABNORMAL HIGH (ref 70–99)
Glucose-Capillary: 83 mg/dL (ref 70–99)
Glucose-Capillary: 126 mg/dL — ABNORMAL HIGH (ref 70–99)
Glucose-Capillary: 72 mg/dL (ref 70–99)

## 2022-10-18 NOTE — Progress Notes (Signed)
Occupational Therapy Treatment Patient Details Name: [name] Gilbert MRN: [id number] DOB: May 26, 1961 Today's Date: 10/18/2022   History of present illness [name] Gilbert is a 62 y.o. woman presenting to hospital with generalized weakness. CT: new infarct in the right basal ganglia and small hyperdense focus in the pons, suspicious for a small  pontine hemorrhage. 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 PHMx:diabetes, hypertension, hyperlipidemia, prior strokes, visual impairment, CKD, medication nonadherance   OT comments  Patient making minimal to no progress towards goals in session. Patient with decreased interaction and flat affect, refusing to look at OT majority of the time. Patient with pills in mouth, but refusing liquids or applesauce to help clear. When patient asked if she wanted to participate more, patient stating, "I just dont want to" but then would engage minimally with mobility specialist. Asked if patient wanted to speak to chaplain, with patient refusing. Patient total A of 2 to complete bed mobility due to poor initiation/refusal to move, and mod/max to maintain balance sitting EOB. OT recommending palliative consult to better understand patient's wishes and provide comprehensive level of care. OT will continue to follow.    Recommendations for follow up therapy are one component of a multi-disciplinary discharge planning process, led by the attending physician.  Recommendations may be updated based on patient status, additional functional criteria and insurance authorization.    Follow Up Recommendations  Skilled nursing-short term rehab (<3 hours/day)     Assistance Recommended at Discharge Frequent or constant Supervision/Assistance  Patient can return home with the following  Two people to help with walking and/or transfers;Two people to help with bathing/dressing/bathroom;Assistance with cooking/housework;Assistance with feeding;Assist for transportation;Help with stairs or ramp  for entrance;Direct supervision/assist for financial management;Direct supervision/assist for medications management   Equipment Recommendations  None recommended by OT    Recommendations for Other Services Other (comment) (Palliative)    Precautions / Restrictions Precautions Precautions: Fall Restrictions Weight Bearing Restrictions: No       Mobility Bed Mobility Overal bed mobility: Needs Assistance Bed Mobility: Rolling, Sidelying to Sit Rolling: Max assist Sidelying to sit: Total assist, +2 for physical assistance, +2 for safety/equipment       General bed mobility comments: assist for bending L knee, hand over hand guidance for placing L hand on railing, use of bed pad to roll hips into sidelying position. Assist for BLE's off edge of bed and trunk to sit upright. Minimal to no effort provided by patient    Transfers                   General transfer comment: unable to complete due to decreased participation     Balance Overall balance assessment: Needs assistance Sitting-balance support: Feet supported Sitting balance-Leahy Scale: Poor Sitting balance - Comments: Left lateral lean, requires multimodal cueing to correct Postural control: Posterior lean                                 ADL either performed or assessed with clinical judgement   ADL Overall ADL's : Needs assistance/impaired   Eating/Feeding Details (indicate cue type and reason): refusing to eat or drink in session   Grooming Details (indicate cue type and reason): refusing to complete                 Toilet Transfer: Total assistance;+2 for physical assistance;+2 for safety/equipment Toilet Transfer Details (indicate cue type and reason): engaging minimally to come into sitting  EOB         Functional mobility during ADLs: Total assistance;+2 for physical assistance;+2 for safety/equipment General ADL Comments: Patient refusing to assist majority of time in  session, but attempting to increase overall seated balance with max encouragement.    Extremity/Trunk Assessment Upper Extremity Assessment RUE Deficits / Details: pt with noted extensor reflexive posturing t/o R UE that could be easily broken by asked her to bend her arm RUE Coordination: decreased fine motor;decreased gross motor            Vision       Perception     Praxis      Cognition Arousal/Alertness: Lethargic Behavior During Therapy: Flat affect Overall Cognitive Status: History of cognitive impairments - at baseline                                 General Comments: Decreased interaction and flat affect, refusing to look at OT majority of the time. Patient with pills in mouth, but refusing liquids or applesauce to help clear. When patient asked if she wanted to participate more, patient stating, "I just dont want to" but then would engage minimally with mobility specialist. Asked if patient wanted to speak to chaplain, with patient refusing.        Exercises      Shoulder Instructions       General Comments      Pertinent Vitals/ Pain       Pain Assessment Pain Assessment: Faces Faces Pain Scale: Hurts even more Pain Location: "I hurt all over" Pain Descriptors / Indicators: Discomfort, Grimacing, Guarding Pain Intervention(s): Limited activity within patient's tolerance, Monitored during session, Repositioned  Home Living                                          Prior Functioning/Environment              Frequency  Min 2X/week        Progress Toward Goals  OT Goals(current goals can now be found in the care plan section)  Progress towards OT goals: Not progressing toward goals - comment (Continues to participate less and less in therapy)  Acute Rehab OT Goals Patient Stated Goal: did not state/refused OT Goal Formulation: With patient/family Time For Goal Achievement: 10/29/22 Potential to Achieve  Goals: [place] Discharge plan remains appropriate    Co-evaluation                 AM-PAC OT "6 Clicks" Daily Activity     Outcome Measure   Help from another person eating meals?: A Little Help from another person taking care of personal grooming?: A Lot Help from another person toileting, which includes using toliet, bedpan, or urinal?: Total Help from another person bathing (including washing, rinsing, drying)?: Total Help from another person to put on and taking off regular upper body clothing?: Total Help from another person to put on and taking off regular lower body clothing?: Total 6 Click Score: 9    End of Session    OT Visit Diagnosis: Unsteadiness on feet (R26.81);Other abnormalities of gait and mobility (R26.89);Muscle weakness (generalized) (M62.81);Other symptoms and signs involving cognitive function;Hemiplegia and hemiparesis Hemiplegia - Right/Left: Right Hemiplegia - dominant/non-dominant: Dominant Hemiplegia - caused by: Cerebral infarction   Activity Tolerance Patient limited by  fatigue   Patient Left in bed;with call bell/phone within reach;with bed alarm set (chair position)   Nurse Communication Mobility status        Time: 9528-4132 OT Time Calculation (min): 26 min  Charges: OT General Charges $OT Visit: 1 Visit OT Treatments $Self Care/Home Management : 23-37 mins  [place]. Mariadejesus Cade, OTR/L Acute Rehabilitation Services [phone]   Ascencion Dike 10/18/2022, 4:36 PM

## 2022-10-18 NOTE — Evaluation (Signed)
Clinical/Bedside Swallow Evaluation Patient Details  Name: [name] Gilbert MRN: [id number] Date of Birth: 1961-06-28  Today's Date: 10/18/2022 Time: SLP Start Time (ACUTE ONLY): 1400 SLP Stop Time (ACUTE ONLY): 1430 SLP Time Calculation (min) (ACUTE ONLY): 30 min  Past Medical History:  Past Medical History:  Diagnosis Date   Diabetes mellitus without complication ([place])    Hypertension    Stroke ([place])    visual impaired    Past Surgical History:  Past Surgical History:  Procedure Laterality Date   ABDOMINAL HYSTERECTOMY     TONSILLECTOMY     HPI:  [name] Gilbert is a 62 y.o. woman presenting to hospital with generalized weakness. CT: new infarct in the right basal ganglia and small hyperdense focus in the pons, suspicious for a small  pontine hemorrhage. 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 PHMx:diabetes, hypertension, hyperlipidemia, prior strokes, visual impairment, CKD, medication nonadherance    Assessment / Plan / Recommendation  Clinical Impression  Pt demonstrates no immediate signs of difficulty with oral or pharyngeal dysphagia. She has adequate ROM, can take sips and swallow without coughing or discomfort. However, as seen in pts cognitive lingusitic evaluation, pts attention and initiation is severely impaired. She spent entire session unsuccessfully trying to fold the top of her blanket and talking about going home. She replied with flat affect and minimal verbalization to most questions and requests. She refused all PO until SLP pressed pt to take sips of juice with visual tactile and hand over hand cues. Despite initaiting sipping from straw and swallowing pt still shook her head no and said I dont want it. Pt refused all solids. Attempted to call husband to determine if she has accepted outside food he has offered.   Anticipate that this is going to be a prolonged problem; short term means of nutrition likely to escalate care. Would advise a more natural environment with  assistance with foods of choice. Prolonged acute admission with long periods of isolation and restricion unlikely to improve pts function. Will continue to follow pt focusing on attention, initaition and interest in food. SLP Visit Diagnosis: Dysphagia, unspecified (R13.10)    Aspiration Risk  Risk for inadequate nutrition/hydration    Diet Recommendation Regular;Thin liquid   Liquid Administration via: Cup;Straw Medication Administration: Whole meds with liquid Supervision: Staff to assist with self feeding Compensations: Minimize environmental distractions Postural Changes: Seated upright at 90 degrees;Other (Comment) (get to chair for meals or wheel pt to a new environment. sit with a table and highly palable finger foods)    Other  Recommendations      Recommendations for follow up therapy are one component of a multi-disciplinary discharge planning process, led by the attending physician.  Recommendations may be updated based on patient status, additional functional criteria and insurance authorization.  Follow up Recommendations        Assistance Recommended at Discharge    Functional Status Assessment    Frequency and Duration            Prognosis Prognosis for improved oropharyngeal function: Fair Barriers to Reach Goals: Cognitive deficits      Swallow Study   General HPI: [name] Gilbert is a 62 y.o. woman presenting to hospital with generalized weakness. CT: new infarct in the right basal ganglia and small hyperdense focus in the pons, suspicious for a small  pontine hemorrhage. 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 PHMx:diabetes, hypertension, hyperlipidemia, prior strokes, visual impairment, CKD, medication nonadherance Type of Study: Bedside Swallow Evaluation Previous Swallow Assessment: none Diet Prior to  this Study: Regular;Thin liquids (Level 0) Temperature Spikes Noted: No Respiratory Status: Room air History of Recent Intubation: No Behavior/Cognition:  Alert;Distractible;Requires cueing Oral Cavity Assessment: Within Functional Limits Oral Care Completed by SLP: No Oral Cavity - Dentition: Adequate natural dentition Vision: Functional for self-feeding Self-Feeding Abilities: Needs assist;Needs set up Patient Positioning: Upright in bed Baseline Vocal Quality: Normal Volitional Cough: Cognitively unable to elicit Volitional Swallow: Unable to elicit    Oral/Motor/Sensory Function     Ice Chips Ice chips: Not tested   Thin Liquid Thin Liquid: Within functional limits Presentation: Straw    Nectar Thick Nectar Thick Liquid: Not tested   Honey Thick Honey Thick Liquid: Not tested   Puree Puree: Not tested   Solid     Solid:  (refused)      Drena Ham, Katherene Ponto 10/18/2022,2:30 PM

## 2022-10-18 NOTE — Progress Notes (Addendum)
[facility] PROGRESS NOTE   Salimata Christenson RSW:[id number] DOB: 07-29-1961 DOA: 10/14/2022  PCP: Christella Scheuermann, MD  Brief History/Interval Summary:  62 y.o. woman with a past medical history significant for diabetes, hypertension, hyperlipidemia, prior strokes, visual impairment, CKD transferred from [facility] on 2/4 after presenting with 24 hours of diffuse increasing weakness and lethargy, EMS called by her husband.  Per husband patient is not compliant with blood pressure medications as well as antiplatelet agents, aspirin and Plavix, at home.  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    Consultants: Neurology  Procedures: Echocardiogram.  Carotid Dopplers  Subjective/Interval History: Patient poorly communicative, alert to person family only.  Unwilling or unable to answer orientation questions otherwise.  Assessment/Plan:  Acute CVA hemorrhagic and ischemic  Repeat CT head stable hemorrhage noted MRI also showed acute infarcts. Echocardiogram shows normal systolic function LDL elevated - statin allergy listed with myopathy but has been prescribed rosuvastatin prior to admission.  Per husband she has been on markedly noncompliant with all her medications -would recommend to continue this medication moving forward HbA1c 7.8. Seen by PT and OT.  SNF is recommended for short-term rehab. Neurology recommending 81 mg aspirin daily  Essential hypertension Noted to be on amlodipine, spironolactone and lisinopril.  Monitor blood pressures closely. Patient was on Cleviprex in the ICU. Stop spironolactone and lisinopril due to rise in creatinine. Clonidine as needed.  Hyperlipidemia LDL levels 241.  Patient has not been compliant with her home medications.  Resume rosuvastatin.  Poor oral intake Continues to refuse food, patient's husband indicates she is quite  picky, recommended family bring in food from outside otherwise we will need to place NG tube and discussed tube feeds moving forward although will need to have goals of care discussion given patient's DNR status she may not wish to be kept alive "artificially" via tube feeds.  Questionable history of seizure disorder Noted to be on Keppra prior to admission which is being continued.  Diabetes mellitus, uncontrolled with hyperglycemia Continue with glargine and SSI.  HbA1c 7.8.  Monitor CBG/hypoglycemic protocol.  Chronic kidney disease stage IV Baseline creatinine is between 2.2-2.6.   On ACE inhibitor and spironolactone prior to admission, currently on hold  DVT Prophylaxis: Subcutaneous heparin Code Status: DNR Family Communication: Husband Disposition Plan: Skilled nursing facility  Status is: Inpatient Remains inpatient appropriate because: Acute stroke    Medications: Scheduled:  amLODipine  10 mg Oral Daily   aspirin EC  81 mg Oral Daily   heparin injection (subcutaneous)  5,000 Units Subcutaneous Q8H   insulin aspart  0-9 Units Subcutaneous TID WC   levETIRAcetam  250 mg Oral BID   pantoprazole (PROTONIX) IV  40 mg Intravenous QHS   rosuvastatin  40 mg Oral QHS   senna-docusate  1 tablet Oral BID   Continuous:  sodium chloride Stopped (10/17/22 1312)   KXF:[id number] **OR** acetaminophen (TYLENOL) oral liquid 160 mg/5 mL **OR** acetaminophen, cloNIDine  Antibiotics: Anti-infectives (From admission, onward)    None       Objective:  Vital Signs  Vitals:   10/17/22 2333 10/18/22 0334 10/18/22 0349 10/18/22 0446  BP: 103/61 (!) 145/101 (!) 167/85 114/64  Pulse: 64 86 87   Resp:  18    Temp:  99.1 F (37.3 C)    TempSrc:  Oral    SpO2:  100%    Weight:  Height:        Intake/Output Summary (Last 24 hours) at 10/18/2022 0729 Last data filed at 10/18/2022 0552 Gross per 24 hour  Intake 649.51 ml  Output 310 ml  Net 339.51 ml    Filed Weights    10/17/22 0500  Weight: (P) 72.6 kg    General appearance: Awake alert to person family and situation.  In no distress. Poorly verbal, questionably behavioral Resp: Poor effort.  Poor air entry at the bases.  No crackles wheezing or rhonchi. Cardio: S1-S2 is normal regular.  No S3-S4.  No rubs murmurs or bruit GI: Abdomen is soft.  Nontender nondistended.  Bowel sounds are present normal.  No masses organomegaly Extremities: No edema.  Physical deconditioning noted.  Lab Results:  Data Reviewed: I have personally reviewed following labs and reports of the imaging studies  CBC: Recent Labs  Lab 10/14/22 1429 10/16/22 0842 10/17/22 0624  WBC 5.6 5.7 6.3  NEUTROABS 3.3 3.9 3.2  HGB 13.2 10.3* 12.6  HCT 40.2 31.8* 39.0  MCV 86.6 88.6 87.8  PLT 291 195 291     Basic Metabolic Panel: Recent Labs  Lab 10/14/22 1429 10/16/22 1132 10/17/22 0624 10/18/22 0544  NA 137 139 138 136  K 4.5 5.8* 4.1 4.9  CL 103 109 105 103  CO2 23 18* 24 23  GLUCOSE 258* 111* 140* 199*  BUN 32* 36* 33* 31*  CREATININE 1.89* 2.21* 2.39* 2.31*  CALCIUM 8.7* 8.8* 8.6* 8.2*     GFR: Estimated Creatinine Clearance: 23 mL/min (A) (by C-G formula based on SCr of 2.31 mg/dL (H)).  Liver Function Tests: Recent Labs  Lab 10/14/22 1429  AST 20  ALT 10  ALKPHOS 84  BILITOT 1.5*  PROT 9.2*  ALBUMIN 3.4*      HbA1C: Recent Labs    10/15/22 1504  HGBA1C 7.8*     CBG: Recent Labs  Lab 10/17/22 1130 10/17/22 1236 10/17/22 1620 10/17/22 2110 10/18/22 0604  GLUCAP 70 126* 193* 83 186*     Lipid Profile: Recent Labs    10/15/22 1504 10/16/22 0610  CHOL 337*  --   HDL 78  --   LDLCALC 241*  --   TRIG 92 123  CHOLHDL 4.3  --       Anemia Panel: No results for input(s): "VITAMINB12", "FOLATE", "FERRITIN", "TIBC", "IRON", "RETICCTPCT" in the last 72 hours.  Radiology Studies: VAS US CAROTID  Result Date: 10/16/2022 Carotid Arterial Duplex Study Patient Name:   RONI FRIBERG  Date of Exam:   10/16/2022 Medical Rec #: [id number]         Accession #:    [id number] Date of Birth: 03/01/1961         Patient Gender: F Patient Age:   53 years Exam Location:  [facility] Procedure:      VAS US CAROTID Referring Phys: Lovey Newcomer --------------------------------------------------------------------------------  Indications:       CVA. Risk Factors:      Hypertension, hyperlipidemia, Diabetes, prior CVA. Other Factors:     Hypertensive emergency (240/120) secondary to medication                    noncompliance. Comparison Study:  No prior study Performing Technologist: Sharion Dove RVS  Examination Guidelines: A complete evaluation includes B-mode imaging, spectral Doppler, color Doppler, and power Doppler as needed of all accessible portions of each vessel. Bilateral testing is considered an integral part of a complete examination. Limited examinations for reoccurring  indications may be performed as noted.  Right Carotid Findings: +----------+--------+--------+--------+------------------+------------------+           PSV cm/sEDV cm/sStenosisPlaque DescriptionComments           +----------+--------+--------+--------+------------------+------------------+ CCA Prox  90      9                                 intimal thickening +----------+--------+--------+--------+------------------+------------------+ CCA Distal75      19                                intimal thickening +----------+--------+--------+--------+------------------+------------------+ ICA Prox  40      11              heterogenous                         +----------+--------+--------+--------+------------------+------------------+ ICA Mid   85      20                                                   +----------+--------+--------+--------+------------------+------------------+ ICA Distal56      14                                                    +----------+--------+--------+--------+------------------+------------------+ ECA       76      9                                                    +----------+--------+--------+--------+------------------+------------------+ +----------+--------+-------+--------+-------------------+           PSV cm/sEDV cmsDescribeArm Pressure (mmHG) +----------+--------+-------+--------+-------------------+ [id number]                                         +----------+--------+-------+--------+-------------------+ +---------+--------+--+--------+-+ VertebralPSV cm/s37EDV cm/s7 +---------+--------+--+--------+-+  Left Carotid Findings: +----------+--------+--------+--------+------------------+------------------+           PSV cm/sEDV cm/sStenosisPlaque DescriptionComments           +----------+--------+--------+--------+------------------+------------------+ CCA Prox  104     10                                intimal thickening +----------+--------+--------+--------+------------------+------------------+ CCA Distal72      13                                intimal thickening +----------+--------+--------+--------+------------------+------------------+ ICA Prox  52      11              heterogenous                         +----------+--------+--------+--------+------------------+------------------+ ICA Mid   48      12                                                   +----------+--------+--------+--------+------------------+------------------+  ICA Distal62      19                                                   +----------+--------+--------+--------+------------------+------------------+ ECA       111     7                                                    +----------+--------+--------+--------+------------------+------------------+ +----------+--------+--------+--------+-------------------+           PSV cm/sEDV cm/sDescribeArm Pressure (mmHG)  +----------+--------+--------+--------+-------------------+ [id number]                                          +----------+--------+--------+--------+-------------------+ +---------+--------+--+--------+-+ VertebralPSV cm/s30EDV cm/s5 +---------+--------+--+--------+-+   Summary: Right Carotid: The extracranial vessels were near-normal with only minimal wall                thickening or plaque. Left Carotid: The extracranial vessels were near-normal with only minimal wall               thickening or plaque. Vertebrals:  Bilateral vertebral arteries demonstrate antegrade flow. Subclavians: Normal flow hemodynamics were seen in bilateral subclavian              arteries. *See table(s) above for measurements and observations.     Preliminary    CT HEAD WO CONTRAST ( )  Result Date: 10/16/2022 CLINICAL DATA:  Stroke, follow-up. EXAM: CT HEAD WITHOUT CONTRAST TECHNIQUE: Contiguous axial images were obtained from the base of the skull through the vertex without intravenous contrast. RADIATION DOSE REDUCTION: This exam was performed according to the departmental dose-optimization program which includes automated exposure control, adjustment of the mA and/or kV according to patient size and/or use of iterative reconstruction technique. COMPARISON:  Head CT October 14, 2022; MRI brain October 15, 2022. FINDINGS: Brain: Again seen are areas of hypodensity in the right basal ganglia, the right frontal and left periventricular white matter as well as posterior limb of the right internal capsule, better seen on prior MRI. There is no evidence of hemorrhagic transformation or significant mass effect. The large remote left PCA territory infarct involving the left thalamus, insula and left temporal occipital region. Small remote right occipital lobe infarct. Confluent hypodensity of the white matter of the cerebral hemispheres, suggestive of chronic microangiopathy. Infratentorially, focus of hyperdensity is  again seen in the pons, slightly less evident than on prior CT. No new large acute territory infarct, new focus of hemorrhage, hydrocephalus, extra-axial collection or mass lesion. Vascular: Calcified plaques in the bilateral carotid siphons and intracranial vertebral arteries. Skull: Normal. Negative for fracture or focal lesion. Sinuses/Orbits: Minimal amount of fluid in the right maxillary sinus. The orbits are maintained. Other: None. IMPRESSION: 1. Evolving infarcts in the right basal ganglia, right frontal and left periventricular white matter as well as posterior limb of the right internal capsule, better seen on prior MRI. No evidence of hemorrhagic transformation or significant mass effect. 2. Small pontine hemorrhage, slightly less evident than on prior CT. 3. No new large acute territory infarct or new focus of hemorrhage. 4. Remote left PCA  territory infarct and small remote right occipital lobe infarct. 5. Chronic microangiopathy. Electronically Signed   By: Pedro Earls M.D.   On: 10/16/2022 13:35       LOS: 4 days   [facility] Pager on [email or web address]  10/18/2022, 7:29 AM

## 2022-10-18 NOTE — Plan of Care (Signed)
  Problem: Education: Goal: Knowledge of disease or condition will improve Outcome: Progressing Goal: Knowledge of secondary prevention will improve (MUST DOCUMENT ALL) Outcome: Progressing   Problem: Intracerebral Hemorrhage Tissue Perfusion: Goal: Complications of Intracerebral Hemorrhage will be minimized Outcome: Progressing   Problem: Self-Care: Goal: Ability to participate in self-care as condition permits will improve Outcome: Progressing Goal: Ability to communicate needs accurately will improve Outcome: Progressing   Problem: Nutrition: Goal: Dietary intake will improve Outcome: Progressing

## 2022-10-19 DIAGNOSIS — I613 Nontraumatic intracerebral hemorrhage in brain stem: Secondary | ICD-10-CM | POA: Diagnosis not present

## 2022-10-19 LAB — GLUCOSE, CAPILLARY
Glucose-Capillary: 59 mg/dL — ABNORMAL LOW (ref 70–99)
Glucose-Capillary: 90 mg/dL (ref 70–99)
Glucose-Capillary: 167 mg/dL — ABNORMAL HIGH (ref 70–99)
Glucose-Capillary: 75 mg/dL (ref 70–99)
Glucose-Capillary: 135 mg/dL — ABNORMAL HIGH (ref 70–99)

## 2022-10-19 NOTE — Inpatient Diabetes Management (Signed)
Inpatient Diabetes Program Recommendations  AACE/ADA: New Consensus Statement on Inpatient Glycemic Control (2015)  Target Ranges:  Prepandial:   less than 140 mg/dL      Peak postprandial:   less than 180 mg/dL (1-2 hours)      Critically ill patients:  140 - 180 mg/dL   Lab Results  Component Value Date   GLUCAP 90 10/19/2022   HGBA1C 7.8 (H) 10/15/2022    Review of Glycemic Control  Latest Reference Range & Units 10/18/22 06:04 10/18/22 11:34 10/18/22 16:25 10/18/22 21:11 10/19/22 06:27 10/19/22 06:56  Glucose-Capillary 70 - 99 mg/dL 186 (H) 83 126 (H) 72 59 (L) 90   Diabetes history: DM 2 Outpatient Diabetes medications: Tresiba 15 units and Novolin R 0-12 units tid in the past Current orders for Inpatient glycemic control:  Novolog 0-9 units tid  Inpatient Diabetes Program Recommendations:    Hypoglycemia of 59 on current Novolog Correction scale.  -  Consider decreasing Novolog Correction scale to "very sensitive" 0-6 units tid + hs starting at 151 mg/dl.  Thanks,  Tama Headings RN, MSN, BC-ADM Inpatient Diabetes Coordinator Team Pager [phone] (8a-5p)

## 2022-10-19 NOTE — Progress Notes (Signed)
[facility] PROGRESS NOTE   [name] Gilbert LZJ:[id number] DOB: [birth year] DOA: 10/14/2022  PCP: Christella Scheuermann, MD  Brief History/Interval Summary:  62 y.o. woman with a past medical history significant for diabetes, hypertension, hyperlipidemia, prior strokes, visual impairment, CKD transferred from [facility] on 2/4 after presenting with 24 hours of diffuse increasing weakness and lethargy, EMS called by her husband.  Per husband patient is not compliant with blood pressure medications as well as antiplatelet agents, aspirin and Plavix, at home.  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    Consultants: Neurology  Procedures: Echocardiogram.  Carotid Dopplers  Subjective/Interval History: Patient more communicative today - alert to person/time/month but not year/place. Continues to improve - may be approaching new baseline.  Assessment/Plan:  Acute CVA hemorrhagic and ischemic  Repeat CT head stable hemorrhage noted MRI also showed acute infarcts. Echocardiogram shows normal systolic function LDL elevated - statin allergy listed with myopathy but has been prescribed rosuvastatin prior to admission.  Per husband she has been on markedly noncompliant with all her medications -would recommend to continue this medication moving forward HbA1c 7.8. Seen by PT and OT.  SNF is recommended for short-term rehab. Neurology recommending 81 mg aspirin daily  Profoundly noncompliant Patient continues to refuse medications on occasion - husband concerned patient will not continue medications at home despite stroke - we discussed we cannot force her to take medications/treatment if she refuses.  Essential hypertension Noted to be on amlodipine, spironolactone and lisinopril.  Monitor blood pressures closely. Patient was on Cleviprex in the ICU. Stop spironolactone and  lisinopril due to rise in creatinine.  DC PRN clonidine - rebound HTN this morning from overnight dose - will need to see where her BP sits without this medication  Hyperlipidemia LDL levels 241.  Patient has not been compliant with her home medications.  Resume rosuvastatin.  Poor oral intake Continues to refuse food, patient's husband indicates she is quite picky, recommended family bring in food from outside otherwise we will need to place NG tube and discussed tube feeds moving forward although will need to have goals of care discussion given patient's DNR status she may not wish to be kept alive "artificially" via tube feeds.  Questionable history of seizure disorder Noted to be on Keppra prior to admission which is being continued.  Diabetes mellitus, uncontrolled with hyperglycemia Continue with glargine and SSI.  HbA1c 7.8.  Monitor CBG/hypoglycemic protocol.  Chronic kidney disease stage IV Baseline creatinine is between 2.2-2.6.   On ACE inhibitor and spironolactone prior to admission, currently on hold - likely resume in the next 24-48h  DVT Prophylaxis: Subcutaneous heparin Code Status: DNR Family Communication: Husband Disposition Plan: Skilled nursing facility  Status is: Inpatient Remains inpatient appropriate because: Acute stroke    Medications: Scheduled:  amLODipine  10 mg Oral Daily   aspirin EC  81 mg Oral Daily   heparin injection (subcutaneous)  5,000 Units Subcutaneous Q8H   insulin aspart  0-9 Units Subcutaneous TID WC   levETIRAcetam  250 mg Oral BID   pantoprazole (PROTONIX) IV  40 mg Intravenous QHS   rosuvastatin  40 mg Oral QHS   senna-docusate  1 tablet Oral BID   KWI:[id number] **OR** acetaminophen (TYLENOL) oral liquid 160 mg/5 mL **OR** acetaminophen, cloNIDine  Antibiotics: Anti-infectives (From admission, onward)    None       Objective:  Vital Signs  Vitals:   10/18/22 2019 10/18/22 2345 10/19/22 0336 10/19/22 0347  BP:  138/69 (!) 146/71 (!) 180/91 106/61  Pulse: 85 82 (!) 104   Resp: 16 18 18    Temp: 97.8 F (36.6 C) 97.7 F (36.5 C) 98.6 F (37 C)   TempSrc: Oral Oral    SpO2: 100% 100% 98%   Weight:      Height:        Intake/Output Summary (Last 24 hours) at 10/19/2022 0734 Last data filed at 10/19/2022 0520 Gross per 24 hour  Intake 30 ml  Output 200 ml  Net -170 ml    Filed Weights   10/17/22 0500  Weight: (P) 72.6 kg    General appearance: Awake alert to person family and situation.  In no distress. Poorly verbal, questionably behavioral Resp: Poor effort.  Poor air entry at the bases.  No crackles wheezing or rhonchi. Cardio: S1-S2 is normal regular.  No S3-S4.  No rubs murmurs or bruit GI: Abdomen is soft.  Nontender nondistended.  Bowel sounds are present normal.  No masses organomegaly Extremities: No edema.  Physical deconditioning noted.  Lab Results:  Data Reviewed: I have personally reviewed following labs and reports of the imaging studies  CBC: Recent Labs  Lab 10/14/22 1429 10/16/22 0842 10/17/22 0624  WBC 5.6 5.7 6.3  NEUTROABS 3.3 3.9 3.2  HGB 13.2 10.3* 12.6  HCT 40.2 31.8* 39.0  MCV 86.6 88.6 87.8  PLT 291 195 291     Basic Metabolic Panel: Recent Labs  Lab 10/14/22 1429 10/16/22 1132 10/17/22 0624 10/18/22 0544  NA 137 139 138 136  K 4.5 5.8* 4.1 4.9  CL 103 109 105 103  CO2 23 18* 24 23  GLUCOSE 258* 111* 140* 199*  BUN 32* 36* 33* 31*  CREATININE 1.89* 2.21* 2.39* 2.31*  CALCIUM 8.7* 8.8* 8.6* 8.2*     GFR: Estimated Creatinine Clearance: 23 mL/min (A) (by C-G formula based on SCr of 2.31 mg/dL (H)).  Liver Function Tests: Recent Labs  Lab 10/14/22 1429  AST 20  ALT 10  ALKPHOS 84  BILITOT 1.5*  PROT 9.2*  ALBUMIN 3.4*      HbA1C: No results for input(s): "HGBA1C" in the last 72 hours.   CBG: Recent Labs  Lab 10/18/22 1134 10/18/22 1625 10/18/22 2111 10/19/22 0627 10/19/22 0656  GLUCAP 83 126* 72 59* 90      Lipid Profile: No results for input(s): "CHOL", "HDL", "LDLCALC", "TRIG", "CHOLHDL", "LDLDIRECT" in the last 72 hours.    Anemia Panel: No results for input(s): "VITAMINB12", "FOLATE", "FERRITIN", "TIBC", "IRON", "RETICCTPCT" in the last 72 hours.  Radiology Studies: No results found.     LOS: 5 days   [facility] Pager on [email or web address]  10/19/2022, 7:34 AM

## 2022-10-19 NOTE — NC FL2 (Signed)
[place] LEVEL OF CARE FORM     IDENTIFICATION  Patient Name: [name] Gilbert Birthdate: [birth year] Sex: female Admission Date (Current Location): 10/14/2022  [facility] and [place] Number:  Engineering geologist and Address:  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/25/2022   Stage 3b chronic kidney disease (CKD) ([place]) 01/25/2022   Chronic diastolic CHF (congestive heart failure) ([place]) 01/25/2022   Hypertensive emergency 01/25/2022   Elevated troponin 01/25/2022   Fall    Neurologic abnormality 06/13/2021   Hypoglycemia 06/13/2021   Hypothermia 06/13/2021   Dehydration 06/12/2021   Headache 06/12/2021   Abdominal pain 06/12/2021   Hypoglycemia associated with diabetes ([place]) 06/12/2021   Acute respiratory failure with hypoxia (HCC)    HHNC (hyperglycemic hyperosmolar nonketotic coma) ([place]) 10/01/2019   HTN (hypertension) 11/01/2018   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    Orientation RESPIRATION BLADDER Height & Weight     Self  Normal  Incontinent Weight: (P) 160 lb 0.9 oz (72.6 kg) Height:  (P) 5' 2"$  (157.5 cm)  BEHAVIORAL SYMPTOMS/MOOD NEUROLOGICAL BOWEL NUTRITION STATUS    Convulsions/Seizures Incontinent Diet (regular)  AMBULATORY STATUS COMMUNICATION OF NEEDS Skin   Extensive Assist Verbally Normal                       Personal Care Assistance Level of Assistance  Bathing, Feeding, Dressing Bathing Assistance: Maximum assistance Feeding assistance: Limited assistance Dressing Assistance: Maximum assistance     Functional Limitations Info  Speech     Speech Info: Impaired (expressive aphasia)    SPECIAL CARE FACTORS FREQUENCY  PT (By licensed PT), OT (By licensed OT)     PT Frequency: 5x/wk OT Frequency: 5x/wk            Contractures Contractures Info: Not present    Additional Factors Info  Code Status, Allergies, Insulin Sliding Scale Code Status Info: DNR Allergies Info: Metformin, Penicillins, Codeine, Hydralazine, Statins   Insulin Sliding Scale Info: see DC summary       Current Medications (10/19/2022):  This is the current hospital active medication list Current Facility-Administered Medications  Medication Dose Route Frequency Provider Last Rate Last Admin   acetaminophen (TYLENOL) tablet 650 mg  650 mg Oral Q4H PRN Bhagat, Srishti L, MD   650 mg at 10/14/22 2241   Or   acetaminophen (TYLENOL) 160 MG/5ML solution 650 mg  650 mg Per Tube Q4H PRN Bhagat, Srishti L, MD       Or   acetaminophen (TYLENOL) suppository 650 mg  650 mg Rectal Q4H PRN Bhagat, Dutch Quint, MD  amLODipine (NORVASC) tablet 10 mg  10 mg Oral Daily Lovey Newcomer C, NP   10 mg at 10/18/22 1017   aspirin EC tablet 81 mg  81 mg Oral Daily Dorene Grebe, MD   81 mg at 10/18/22 1017   heparin injection 5,000 Units  5,000 Units Subcutaneous Q8H Lovey Newcomer C, NP   5,000 Units at 10/19/22 1253   insulin aspart (novoLOG) injection 0-9 Units  0-9 Units Subcutaneous TID WC Bhagat, Srishti L, MD   2 Units at  10/19/22 1239   levETIRAcetam (KEPPRA) tablet 250 mg  250 mg Oral BID Bonnielee Haff, MD   250 mg at 10/18/22 1017   pantoprazole (PROTONIX) injection 40 mg  40 mg Intravenous QHS Bhagat, Srishti L, MD   40 mg at 10/16/22 2146   rosuvastatin (CRESTOR) tablet 40 mg  40 mg Oral QHS Bonnielee Haff, MD   40 mg at 10/17/22 2200   senna-docusate (Senokot-S) tablet 1 tablet  1 tablet Oral BID Lorenza Chick, MD   1 tablet at 10/18/22 1017     Discharge Medications: Please see discharge summary for a list of discharge medications.  Relevant Imaging Results:  Relevant Lab Results:   Additional Information SS#: [id number]  Geralynn Ochs, LCSW

## 2022-10-19 NOTE — Progress Notes (Signed)
Hypoglycemic Event  CBG: 59  Treatment: 4 oz juice/soda  Symptoms:None  Follow-up CBG: XNTZ:0017 CBG Result:90  Possible Reasons for Event: Inadequate meal intake  Comments/MD notified:Dr. Tami Ribas

## 2022-10-19 NOTE — TOC Initial Note (Signed)
Transition of Care [facility]) - Initial/Assessment Note    Patient Details  Name: [name] Gilbert MRN: [id number] Date of Birth: 01/15/1961  Transition of [facility]) CM/SW Contact:    Geralynn Ochs, LCSW Phone Number: 10/19/2022, 1:50 PM  Clinical Narrative:         CSW spoke with patient's spouse, [name] Gilbert, to discuss recommendation for SNF. Robert in agreement, requesting somewhere other than [facility] as patient has been there before and he wasn't happy with it. CSW faxed out referral, awaiting response.          Expected Discharge Plan: Skilled Nursing Facility Barriers to Discharge: Continued Medical Work up, Ship broker   Patient Goals and CMS Choice Patient states their goals for this hospitalization and ongoing recovery are:: patient unable to participate in goal setting, not oriented CMS Medicare.gov Compare Post Acute Care list provided to:: Patient Represenative (must comment) Choice offered to / list presented to : [place] ownership interest in [facility]provided to:: Spouse    Expected Discharge Plan and Services     Post Acute Care Choice: [place] Living arrangements for the past 2 months: Single Family Home                                      Prior Living Arrangements/Services Living arrangements for the past 2 months: Single Family Home Lives with:: Spouse Patient language and need for interpreter reviewed:: No Do you feel safe going back to the place where you live?: Yes      Need for Family Participation in Patient Care: Yes (Comment) Care giver support system in place?: No (comment)   Criminal Activity/Legal Involvement Pertinent to Current Situation/Hospitalization: No - Comment as needed  Activities of Daily Living      Permission Sought/Granted Permission sought to share information with : Facility Sport and exercise psychologist, Family Supports Permission granted to share information with :  Yes, Verbal Permission Granted  Share Information with NAME: [name] Gilbert  Permission granted to share info w AGENCY: SNF  Permission granted to share info w Relationship: Spouse     Emotional Assessment   Attitude/Demeanor/Rapport: Unable to Assess Affect (typically observed): Unable to Assess Orientation: : Oriented to Self Alcohol / Substance Use: Not Applicable Psych Involvement: No (comment)  Admission diagnosis:  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[place]) [I61.3] Patient Active Problem List   Diagnosis Date Noted   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   Advanced care planning/counseling discussion 02/02/2022   Left thalamic infarction ([place]) 02/02/2022   Hyperkalemia 02/01/2022   Closed fracture of right distal fibula    Stroke ([place]) 01/25/2022   HLD (hyperlipidemia) 01/25/2022   Type II diabetes mellitus with renal manifestations ([place]) 01/25/2022   Stage 3b chronic kidney disease (CKD) ([place]) 01/25/2022   Chronic diastolic CHF (congestive heart failure) ([place]) 01/25/2022   Hypertensive emergency 01/25/2022   Elevated troponin 01/25/2022   Fall    Neurologic abnormality 06/13/2021   Hypoglycemia 06/13/2021   Hypothermia 06/13/2021   Dehydration 06/12/2021   Headache 06/12/2021   Abdominal pain 06/12/2021   Hypoglycemia associated with diabetes ([place]) 06/12/2021   Acute respiratory failure with hypoxia ([place])    HHNC (hyperglycemic hyperosmolar nonketotic coma) ([place]) 10/01/2019   HTN (hypertension) 11/01/2018   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

## 2022-10-19 NOTE — Progress Notes (Signed)
Physical Therapy Treatment Patient Details Name: [name] Gilbert MRN: [id number] DOB: 10-04-1960 Today's Date: 10/19/2022   History of Present Illness [name] is a 62 y.o. woman presenting to hospital with generalized weakness. CT: new infarct in the right basal ganglia and small hyperdense focus in the pons, suspicious for a small  pontine hemorrhage. 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 PHMx:diabetes, hypertension, hyperlipidemia, prior strokes, visual impairment, CKD, medication nonadherance    PT Comments    Pt is slowly progressing towards goals. Pt continues at a heavy total assistance for bed mobility. Able to progress today from total assist sitting EOB to Min A with multi modal cueing. Extra time spent on orienting pt to mid-line. Pt was able to reach with the RUE and is demonstrating a fear of falling stating "I'm going to fall, I'm scared I'm going to fall" when sitting EOB. Due to pt current functional status, home set up, available assistance at home recommending skilled physical therapy services in SNF setting on discharge from acute care hospital in order to decrease risk for immobility, skin break down, contractures, falls and re-hospitalization.     Recommendations for follow up therapy are one component of a multi-disciplinary discharge planning process, led by the attending physician.  Recommendations may be updated based on patient status, additional functional criteria and insurance authorization.  Follow Up Recommendations  Skilled nursing-short term rehab (<3 hours/day) Can patient physically be transported by private vehicle: No   Assistance Recommended at Discharge Frequent or constant Supervision/Assistance  Patient can return home with the following Assist for transportation;Help with stairs or ramp for entrance;Two people to help with walking and/or transfers;Assistance with cooking/housework   Equipment Recommendations  Other (comment) (defer to post acute)     Recommendations for Other Services       Precautions / Restrictions Precautions Precautions: Fall Restrictions Weight Bearing Restrictions: No     Mobility  Bed Mobility Overal bed mobility: Needs Assistance Bed Mobility: Sidelying to Sit, Rolling, Sit to Sidelying Rolling: Max assist Sidelying to sit: +2 for physical assistance, Max assist     Sit to sidelying: +2 for physical assistance, Total assist General bed mobility comments: Pt was able to initiate LE movement with Max Tactile cues and verbal cues on both R and L with spastic movements on the R and more intentional movements on the L. Pt was able to reach with the RUE with tactile cueing for initiation but was unable to grab the rail. Pt requires Total assist for sitting EOB with help at bil LE and trunk as well as for sitting to supine with cueing pt was able to minimally participate but less than 25% and required 2 person assist. Patient Response: Anxious  Transfers     General transfer comment: Attempted to perform sit to stand EOB. Pt was not initiating movement. Pt was able to hold onto AD, then tried HHA but no initiation. Did not perform.    Ambulation/Gait         Gait velocity: Pt is unable at this time.              Modified Rankin (Stroke Patients Only) Modified Rankin (Stroke Patients Only) Pre-Morbid Rankin Score: Moderately severe disability Modified Rankin: Severe disability     Balance Overall balance assessment: Needs assistance Sitting-balance support: Feet supported, Single extremity supported Sitting balance-Leahy Scale: Poor Sitting balance - Comments: posterior lean and L lean initially with total assist; pt was able to be oriented to mid-line and by end of session  was Min a sitting EOB Postural control: Posterior lean, Left lateral lean   Standing balance-Leahy Scale: Zero Standing balance comment: Pt was unable to stand       Cognition Arousal/Alertness:  Awake/alert Behavior During Therapy: Flat affect, Anxious Overall Cognitive Status: History of cognitive impairments - at baseline       General Comments: Pt was more engaged this session. Interacting with PT and mobility specialist.           General Comments General comments (skin integrity, edema, etc.): Pt is currently with difficulty looking toward the R or initiating movement on the R. She was able to each for and hold a sprite bottle on the R wtih continued extensor tone at the RLE and flexor tone at the RUE      Pertinent Vitals/Pain Pain Assessment Pain Assessment: Faces Faces Pain Scale: No hurt     PT Goals (current goals can now be found in the care plan section) Acute Rehab PT Goals Patient Stated Goal: didn't state PT Goal Formulation: With patient/family Time For Goal Achievement: 10/29/22 Potential to Achieve Goals: Good Progress towards PT goals: Progressing toward goals    Frequency    Min 2X/week      PT Plan Current plan remains appropriate       AM-PAC PT "6 Clicks" Mobility   Outcome Measure  Help needed turning from your back to your side while in a flat bed without using bedrails?: Total Help needed moving from lying on your back to sitting on the side of a flat bed without using bedrails?: Total Help needed moving to and from a bed to a chair (including a wheelchair)?: Total Help needed standing up from a chair using your arms (e.g., wheelchair or bedside chair)?: Total Help needed to walk in hospital room?: Total Help needed climbing 3-5 steps with a railing? : Total 6 Click Score: 6    End of Session   Activity Tolerance: Patient tolerated treatment well;Patient limited by fatigue Patient left: in bed;with call bell/phone within reach;with bed alarm set;with family/visitor present Nurse Communication: Mobility status PT Visit Diagnosis: Unsteadiness on feet (R26.81);Muscle weakness (generalized) (M62.81)     Time: 4097-3532 PT  Time Calculation (min) (ACUTE ONLY): 28 min  Charges:  $Therapeutic Activity: 23-37 mins                    Tomma Rakers, DPT, CLT  Acute Rehabilitation Services Office: [phone] (Secure chat preferred)    Ander Purpura 10/19/2022, 12:02 PM

## 2022-10-20 DIAGNOSIS — I613 Nontraumatic intracerebral hemorrhage in brain stem: Secondary | ICD-10-CM | POA: Diagnosis not present

## 2022-10-20 LAB — GLUCOSE, CAPILLARY
Glucose-Capillary: 81 mg/dL (ref 70–99)
Glucose-Capillary: 157 mg/dL — ABNORMAL HIGH (ref 70–99)
Glucose-Capillary: 197 mg/dL — ABNORMAL HIGH (ref 70–99)
Glucose-Capillary: 143 mg/dL — ABNORMAL HIGH (ref 70–99)

## 2022-10-20 NOTE — Plan of Care (Signed)
  Problem: Education: Goal: Knowledge of disease or condition will improve Outcome: Progressing Goal: Knowledge of secondary prevention will improve (MUST DOCUMENT ALL) Outcome: Progressing Goal: Knowledge of patient specific risk factors will improve (Mark N/A or DELETE if not current risk factor) Outcome: Progressing   Problem: Intracerebral Hemorrhage Tissue Perfusion: Goal: Complications of Intracerebral Hemorrhage will be minimized Outcome: Progressing   Problem: Coping: Goal: Will verbalize positive feelings about self Outcome: Progressing Goal: Will identify appropriate support needs Outcome: Progressing   Problem: Health Behavior/Discharge Planning: Goal: Ability to manage health-related needs will improve Outcome: Progressing Goal: Goals will be collaboratively established with patient/family Outcome: Progressing   Problem: Self-Care: Goal: Ability to participate in self-care as condition permits will improve Outcome: Progressing Goal: Verbalization of feelings and concerns over difficulty with self-care will improve Outcome: Progressing Goal: Ability to communicate needs accurately will improve Outcome: Progressing   Problem: Nutrition: Goal: Risk of aspiration will decrease Outcome: Progressing Goal: Dietary intake will improve Outcome: Progressing   Problem: Education: Goal: Ability to describe self-care measures that may prevent or decrease complications (Diabetes Survival Skills Education) will improve Outcome: Progressing Goal: Individualized Educational Video(s) Outcome: Progressing   Problem: Coping: Goal: Ability to adjust to condition or change in health will improve Outcome: Progressing   Problem: Fluid Volume: Goal: Ability to maintain a balanced intake and output will improve Outcome: Progressing   Problem: Health Behavior/Discharge Planning: Goal: Ability to identify and utilize available resources and services will improve Outcome:  Progressing Goal: Ability to manage health-related needs will improve Outcome: Progressing   Problem: Metabolic: Goal: Ability to maintain appropriate glucose levels will improve Outcome: Progressing   Problem: Nutritional: Goal: Maintenance of adequate nutrition will improve Outcome: Progressing Goal: Progress toward achieving an optimal weight will improve Outcome: Progressing   Problem: Skin Integrity: Goal: Risk for impaired skin integrity will decrease Outcome: Progressing   Problem: Tissue Perfusion: Goal: Adequacy of tissue perfusion will improve Outcome: Progressing   Problem: Education: Goal: Knowledge of General Education information will improve Description: Including pain rating scale, medication(s)/side effects and non-pharmacologic comfort measures Outcome: Progressing   Problem: Health Behavior/Discharge Planning: Goal: Ability to manage health-related needs will improve Outcome: Progressing   Problem: Clinical Measurements: Goal: Ability to maintain clinical measurements within normal limits will improve Outcome: Progressing Goal: Will remain free from infection Outcome: Progressing Goal: Diagnostic test results will improve Outcome: Progressing Goal: Respiratory complications will improve Outcome: Progressing Goal: Cardiovascular complication will be avoided Outcome: Progressing   Problem: Activity: Goal: Risk for activity intolerance will decrease Outcome: Progressing   Problem: Nutrition: Goal: Adequate nutrition will be maintained Outcome: Progressing   Problem: Coping: Goal: Level of anxiety will decrease Outcome: Progressing   Problem: Elimination: Goal: Will not experience complications related to bowel motility Outcome: Progressing Goal: Will not experience complications related to urinary retention Outcome: Progressing   

## 2022-10-20 NOTE — Progress Notes (Signed)
Mobility Specialist: Progress Note   10/20/22 1407  Mobility  Activity Stood at bedside  Level of Assistance +2 (takes two people)  Assistive Device Other (Comment) (HHA)  Activity Response Tolerated fair  Mobility Referral Yes  $Mobility charge 1 Mobility   Pt received in the bed and agreeable to mobility. MaxA for bed mobility supine to sitting EOB and +2 mod-maxA to stand EOB x2. Pt able to stand for a little over 1 minute during the first bout and 20 seconds for the second bout. C/o LLE pain during bed mobility but no c/o pain during standing. Pt assisted back to bed after session with call bell at her side. Bed alarm is on.   Kief Pericles Carmicheal Mobility Specialist Please contact via SecureChat or Rehab office at [phone]

## 2022-10-20 NOTE — TOC Progression Note (Signed)
Transition of Care [facility]) - Progression Note    Patient Details  Name: [name] MRN: [id number] Date of Birth: October 28, 1960  Transition of Care [facility]) CM/SW [place], [place] Phone Number: 10/20/2022, 3:32 PM  Clinical Narrative:   CSW spoke with patient's spouse to provide only bed offer at [facility]. Spouse interested in finding something closer. Referrals to [facility] and [facility] are still pending, CSW reached out to both SNFs to ask them to review referrals. Awaiting response. CSW also sent referral to [facility] in [place], awaiting response. CSW to follow.    Expected Discharge Plan: [place] Barriers to Discharge: Continued Medical Work up, Orthoptist and [place] Choice: [place] Living arrangements for the past 2 months: Single Family Home                                       Social Determinants of Health (SDOH) Interventions SDOH Screenings   Tobacco Use: Low Risk  (10/14/2022)    Readmission Risk Interventions     No data to display

## 2022-10-20 NOTE — Progress Notes (Signed)
[facility] PROGRESS NOTE   [name] Gilbert [id number] DOB: 01-Oct-1960 DOA: 10/14/2022  PCP: Christella Scheuermann, MD  Brief History/Interval Summary:  62 y.o. woman with a past medical history significant for diabetes, hypertension, hyperlipidemia, prior strokes, visual impairment, CKD transferred from [facility] on 2/4 after presenting with 24 hours of diffuse increasing weakness and lethargy, EMS called by her husband.  Per husband patient is not compliant with blood pressure medications as well as antiplatelet agents, aspirin and Plavix, at home.  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    Consultants: Neurology  Procedures: Echocardiogram.  Carotid Dopplers  Subjective/Interval History: No acute issues or events overnight, patient's mental status appears to be stable, she appears to be unwilling to answer questions today stating "I do not know" no matter what the question is.  This is likely her new baseline.  Assessment/Plan:  Acute CVA hemorrhagic and ischemic  Repeat CT head stable hemorrhage noted MRI also showed acute infarcts. Echocardiogram shows normal systolic function LDL elevated - statin allergy listed with myopathy but has been prescribed rosuvastatin prior to admission.  Per husband she has been on markedly noncompliant with all her medications -would recommend to continue this medication moving forward HbA1c 7.8. Seen by PT and OT.  SNF is recommended for short-term rehab. Neurology recommending 81 mg aspirin daily  Profoundly noncompliant Patient continues to refuse medications on occasion - husband concerned patient will not continue medications at home despite stroke - we discussed we cannot force her to take medications/treatment if she refuses.  Essential hypertension Noted to be on amlodipine, spironolactone and lisinopril.  Monitor blood  pressures closely. Patient was on Cleviprex in the ICU. Stop spironolactone and lisinopril due to rise in creatinine.  DC PRN clonidine - rebound HTN this morning from overnight dose - will need to see where her BP sits without this medication  Hyperlipidemia LDL levels 241.  Patient has not been compliant with her home medications.  Resume rosuvastatin.  Poor oral intake Continues to refuse food, patient's husband indicates she is quite picky, recommended family bring in food from outside otherwise we will need to place NG tube and discussed tube feeds moving forward although will need to have goals of care discussion given patient's DNR status she may not wish to be kept alive "artificially" via tube feeds.  Questionable history of seizure disorder Noted to be on Keppra prior to admission which is being continued.  Diabetes mellitus, uncontrolled with hyperglycemia Continue with glargine and SSI.  HbA1c 7.8.  Monitor CBG/hypoglycemic protocol.  Chronic kidney disease stage IV Baseline creatinine is between 2.2-2.6.   On ACE inhibitor and spironolactone prior to admission, currently on hold - likely resume in the next 24-48h  DVT Prophylaxis: Subcutaneous heparin Code Status: DNR Family Communication: Husband Disposition Plan: Skilled nursing facility  Status is: Inpatient Remains inpatient appropriate because: Acute stroke    Medications: Scheduled:  amLODipine  10 mg Oral Daily   aspirin EC  81 mg Oral Daily   heparin injection (subcutaneous)  5,000 Units Subcutaneous Q8H   insulin aspart  0-9 Units Subcutaneous TID WC   levETIRAcetam  250 mg Oral BID   pantoprazole (PROTONIX) IV  40 mg Intravenous QHS   rosuvastatin  40 mg Oral QHS   senna-docusate  1 tablet Oral BID   [id number] **OR** acetaminophen (TYLENOL) oral liquid 160 mg/5 mL **OR** acetaminophen  Antibiotics: Anti-infectives (From admission, onward)    None       Objective:  Vital  Signs  Vitals:   10/19/22 1501 10/20/22 0008 10/20/22 0349 10/20/22 0733  BP: (!) 171/84 128/64 (!) 140/69 131/63  Pulse: 81 75 69 72  Resp: 17 16 16 15  $ Temp: 97.6 F (36.4 C) 98.8 F (37.1 C) 98.3 F (36.8 C) 98.1 F (36.7 C)  TempSrc: Oral Oral Oral Oral  SpO2:  100% 100% 99%  Weight:      Height:        Intake/Output Summary (Last 24 hours) at 10/20/2022 0742 Last data filed at 10/20/2022 0547 Gross per 24 hour  Intake 200 ml  Output 650 ml  Net -450 ml    Filed Weights   10/17/22 0500  Weight: (P) 72.6 kg    General appearance: Awake alert to person family and situation.  In no distress. Poorly verbal, questionably behavioral Resp: Poor effort.  Poor air entry at the bases.  No crackles wheezing or rhonchi. Cardio: S1-S2 is normal regular.  No S3-S4.  No rubs murmurs or bruit GI: Abdomen is soft.  Nontender nondistended.  Bowel sounds are present normal.  No masses organomegaly Extremities: No edema.  Physical deconditioning noted.  Lab Results:  Data Reviewed: I have personally reviewed following labs and reports of the imaging studies  CBC: Recent Labs  Lab 10/14/22 1429 10/16/22 0842 10/17/22 0624  WBC 5.6 5.7 6.3  NEUTROABS 3.3 3.9 3.2  HGB 13.2 10.3* 12.6  HCT 40.2 31.8* 39.0  MCV 86.6 88.6 87.8  PLT 291 195 291     Basic Metabolic Panel: Recent Labs  Lab 10/14/22 1429 10/16/22 1132 10/17/22 0624 10/18/22 0544  NA 137 139 138 136  K 4.5 5.8* 4.1 4.9  CL 103 109 105 103  CO2 23 18* 24 23  GLUCOSE 258* 111* 140* 199*  BUN 32* 36* 33* 31*  CREATININE 1.89* 2.21* 2.39* 2.31*  CALCIUM 8.7* 8.8* 8.6* 8.2*     GFR: Estimated Creatinine Clearance: 23 mL/min (A) (by C-G formula based on SCr of 2.31 mg/dL (H)).  Liver Function Tests: Recent Labs  Lab 10/14/22 1429  AST 20  ALT 10  ALKPHOS 84  BILITOT 1.5*  PROT 9.2*  ALBUMIN 3.4*      HbA1C: No results for input(s): "HGBA1C" in the last 72 hours.   CBG: Recent Labs  Lab  10/19/22 0656 10/19/22 1237 10/19/22 1648 10/19/22 2058 10/20/22 0625  GLUCAP 90 167* 75 135* 81     Lipid Profile: No results for input(s): "CHOL", "HDL", "LDLCALC", "TRIG", "CHOLHDL", "LDLDIRECT" in the last 72 hours.    Anemia Panel: No results for input(s): "VITAMINB12", "FOLATE", "FERRITIN", "TIBC", "IRON", "RETICCTPCT" in the last 72 hours.  Radiology Studies: No results found.     LOS: 6 days   [facility] Pager on [email or web address]  10/20/2022, 7:42 AM

## 2022-10-21 DIAGNOSIS — I613 Nontraumatic intracerebral hemorrhage in brain stem: Secondary | ICD-10-CM | POA: Diagnosis not present

## 2022-10-21 LAB — GLUCOSE, CAPILLARY
Glucose-Capillary: 175 mg/dL — ABNORMAL HIGH (ref 70–99)
Glucose-Capillary: 233 mg/dL — ABNORMAL HIGH (ref 70–99)
Glucose-Capillary: 238 mg/dL — ABNORMAL HIGH (ref 70–99)

## 2022-10-21 MED ORDER — PANTOPRAZOLE SODIUM 40 MG PO TBEC
40.0000 mg | DELAYED_RELEASE_TABLET | Freq: Every day | ORAL | Status: DC
  Administered 2022-10-21 – 2022-10-22 (×2): 40 mg via ORAL
  Filled 2022-10-21 (×4): qty 1

## 2022-10-21 NOTE — Progress Notes (Signed)
Mobility Specialist Progress Note   10/21/22 1515  Mobility  Activity Stood at bedside (x3)  Level of Assistance Maximum assist, patient does 25-49%  Assistive Device Front wheel walker  Activity Response Tolerated well  Mobility Referral Yes  $Mobility charge 1 Mobility   Received pt in bed c/o pain in their hypogastric region but agreeable. Required modA to EOB w/ HOB inclined, pt having decreased core control. Pt needed increase time to respond to verbal cues and tactile cues to initiate task. X3 STS w/ pt digressing w/ each stand attempt expressing fatigue. Returned back to bed w/ MaxA, left w/ call bell in reach and bed alarm on.    Holland Falling Mobility Specialist Please contact via SecureChat or  Rehab office at [phone]

## 2022-10-21 NOTE — Plan of Care (Signed)
  Problem: Education: Goal: Knowledge of disease or condition will improve Outcome: Progressing Goal: Knowledge of secondary prevention will improve (MUST DOCUMENT ALL) Outcome: Progressing Goal: Knowledge of patient specific risk factors will improve Elta [place] N/A or DELETE if not current risk factor) Outcome: Progressing   Problem: Intracerebral Hemorrhage Tissue Perfusion: Goal: Complications of Intracerebral Hemorrhage will be minimized Outcome: Progressing   Problem: Coping: Goal: Will verbalize positive feelings about self Outcome: Progressing Goal: Will identify appropriate support needs Outcome: Progressing   Problem: Health Behavior/Discharge Planning: Goal: Ability to manage health-related needs will improve Outcome: Progressing Goal: Goals will be collaboratively established with patient/family Outcome: Progressing   Problem: Self-Care: Goal: Ability to participate in self-care as condition permits will improve Outcome: Progressing Goal: Verbalization of feelings and concerns over difficulty with self-care will improve Outcome: Progressing Goal: Ability to communicate needs accurately will improve Outcome: Progressing   Problem: Nutrition: Goal: Risk of aspiration will decrease Outcome: Progressing Goal: Dietary intake will improve Outcome: Progressing   Problem: Education: Goal: Ability to describe self-care measures that may prevent or decrease complications (Diabetes Survival Skills Education) will improve Outcome: Progressing Goal: Individualized Educational Video(s) Outcome: Progressing   Problem: Coping: Goal: Ability to adjust to condition or change in health will improve Outcome: Progressing   Problem: Fluid Volume: Goal: Ability to maintain a balanced intake and output will improve Outcome: Progressing   Problem: Health Behavior/Discharge Planning: Goal: Ability to identify and utilize available resources and services will improve Outcome:  Progressing Goal: Ability to manage health-related needs will improve Outcome: Progressing   Problem: Metabolic: Goal: Ability to maintain appropriate glucose levels will improve Outcome: Progressing   Problem: Nutritional: Goal: Maintenance of adequate nutrition will improve Outcome: Progressing Goal: Progress toward achieving an optimal weight will improve Outcome: Progressing   Problem: Skin Integrity: Goal: Risk for impaired skin integrity will decrease Outcome: Progressing   Problem: Tissue Perfusion: Goal: Adequacy of tissue perfusion will improve Outcome: Progressing   Problem: Education: Goal: Knowledge of General Education information will improve Description: Including pain rating scale, medication(s)/side effects and non-pharmacologic comfort measures Outcome: Progressing   Problem: Health Behavior/Discharge Planning: Goal: Ability to manage health-related needs will improve Outcome: Progressing   Problem: Clinical Measurements: Goal: Ability to maintain clinical measurements within normal limits will improve Outcome: Progressing Goal: Will remain free from infection Outcome: Progressing Goal: Diagnostic test results will improve Outcome: Progressing Goal: Respiratory complications will improve Outcome: Progressing Goal: Cardiovascular complication will be avoided Outcome: Progressing   Problem: Activity: Goal: Risk for activity intolerance will decrease Outcome: Progressing   Problem: Nutrition: Goal: Adequate nutrition will be maintained Outcome: Progressing   Problem: Coping: Goal: Level of anxiety will decrease Outcome: Progressing   Problem: Elimination: Goal: Will not experience complications related to bowel motility Outcome: Progressing Goal: Will not experience complications related to urinary retention Outcome: Progressing

## 2022-10-21 NOTE — Progress Notes (Signed)
[facility] PROGRESS NOTE   [name] Gilbert [id number] DOB: March 17, 1961 DOA: 10/14/2022  PCP: Christella Scheuermann, MD  Brief History/Interval Summary:  62 y.o. woman with a past medical history significant for diabetes, hypertension, hyperlipidemia, prior strokes, visual impairment, CKD transferred from [facility] on 2/4 after presenting with 24 hours of diffuse increasing weakness and lethargy, EMS called by her husband.  Per husband patient is not compliant with blood pressure medications as well as antiplatelet agents, aspirin and Plavix, at home.  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    Patient medically stable for discharge, continues to improve in mentation, will likely have prolonged recovery stage given hemorrhagic [facility]. Currently awaiting safe disposition (SNF recommended) - case mgmt looking for facility close to husband per his request.  Consultants: Neurology  Procedures: Echocardiogram.  Carotid Dopplers  Subjective/Interval History: No acute issues or events overnight, patient's mental status appears to be stable, she appears to be unwilling to answer questions today stating "I do not know" no matter what the question is.  This is likely her new baseline.  Assessment/Plan:  Acute CVA hemorrhagic and ischemic  Repeat CT head stable hemorrhage noted MRI also showed acute infarcts. Echocardiogram shows normal systolic function LDL elevated - statin allergy listed with myopathy but has been prescribed rosuvastatin prior to admission.  Per husband she has been on markedly noncompliant with all her medications -would recommend to continue this medication moving forward HbA1c 7.8. Seen by PT and OT.  SNF is recommended for short-term rehab. Neurology recommending 81 mg aspirin daily  Profoundly noncompliant Patient continues to refuse medications on occasion  - husband concerned patient will not continue medications at home despite stroke - we discussed we cannot force her to take medications/treatment if she refuses.  Essential hypertension Noted to be on amlodipine, spironolactone and lisinopril.  Monitor blood pressures closely. Patient was on Cleviprex in the ICU. Stop spironolactone and lisinopril due to rise in creatinine.  DC PRN clonidine - rebound HTN this morning from overnight dose - will need to see where her BP sits without this medication  Hyperlipidemia LDL levels 241.  Patient has not been compliant with her home medications.  Resume rosuvastatin.  Poor oral intake Continues to refuse food, patient's husband indicates she is quite picky, recommended family bring in food from outside otherwise we will need to place NG tube and discussed tube feeds moving forward although will need to have goals of care discussion given patient's DNR status she may not wish to be kept alive "artificially" via tube feeds.  Questionable history of seizure disorder Noted to be on Keppra prior to admission which is being continued.  Diabetes mellitus, uncontrolled with hyperglycemia Continue with glargine and SSI.  HbA1c 7.8.  Monitor CBG/hypoglycemic protocol.  Chronic kidney disease stage IV Baseline creatinine is between 2.2-2.6.   On ACE inhibitor and spironolactone prior to admission, currently on hold - likely resume in the next 24-48h  DVT Prophylaxis: Subcutaneous heparin Code Status: DNR Family Communication: Husband Disposition Plan: Skilled nursing facility  Status is: Inpatient Remains inpatient appropriate because: Acute stroke    Medications: Scheduled:  amLODipine  10 mg Oral Daily   aspirin EC  81 mg Oral Daily   heparin injection (subcutaneous)  5,000 Units Subcutaneous Q8H   insulin aspart  0-9 Units Subcutaneous TID WC   levETIRAcetam  250 mg Oral BID  pantoprazole (PROTONIX) IV  40 mg Intravenous QHS   rosuvastatin   40 mg Oral QHS   senna-docusate  1 tablet Oral BID   [id number] **OR** acetaminophen (TYLENOL) oral liquid 160 mg/5 mL **OR** acetaminophen  Antibiotics: Anti-infectives (From admission, onward)    None       Objective:  Vital Signs  Vitals:   10/20/22 1136 10/20/22 1512 10/20/22 2353 10/21/22 0443  BP: (!) 144/96 (!) 142/73 (!) 123/54 132/60  Pulse: (!) 107 76 64 64  Resp: 16 16 14 16  $ Temp: 97.7 F (36.5 C) 97.7 F (36.5 C) 98.1 F (36.7 C) 98 F (36.7 C)  TempSrc: Oral Oral Axillary Axillary  SpO2: 98% 100% 100% 100%  Weight:      Height:        Intake/Output Summary (Last 24 hours) at 10/21/2022 0743 Last data filed at 10/20/2022 1800 Gross per 24 hour  Intake 350 ml  Output 200 ml  Net 150 ml    Filed Weights   10/17/22 0500  Weight: (P) 72.6 kg    General appearance: Awake alert to person family and situation.  In no distress. Poorly verbal, questionably behavioral Resp: Poor effort.  Poor air entry at the bases.  No crackles wheezing or rhonchi. Cardio: S1-S2 is normal regular.  No S3-S4.  No rubs murmurs or bruit GI: Abdomen is soft.  Nontender nondistended.  Bowel sounds are present normal.  No masses organomegaly Extremities: No edema.  Physical deconditioning noted.  Lab Results:  Data Reviewed: I have personally reviewed following labs and reports of the imaging studies  CBC: Recent Labs  Lab 10/14/22 1429 10/16/22 0842 10/17/22 0624  WBC 5.6 5.7 6.3  NEUTROABS 3.3 3.9 3.2  HGB 13.2 10.3* 12.6  HCT 40.2 31.8* 39.0  MCV 86.6 88.6 87.8  PLT 291 195 291     Basic Metabolic Panel: Recent Labs  Lab 10/14/22 1429 10/16/22 1132 10/17/22 0624 10/18/22 0544  NA 137 139 138 136  K 4.5 5.8* 4.1 4.9  CL 103 109 105 103  CO2 23 18* 24 23  GLUCOSE 258* 111* 140* 199*  BUN 32* 36* 33* 31*  CREATININE 1.89* 2.21* 2.39* 2.31*  CALCIUM 8.7* 8.8* 8.6* 8.2*     GFR: Estimated Creatinine Clearance: 23 mL/min (A) (by C-G formula  based on SCr of 2.31 mg/dL (H)).  Liver Function Tests: Recent Labs  Lab 10/14/22 1429  AST 20  ALT 10  ALKPHOS 84  BILITOT 1.5*  PROT 9.2*  ALBUMIN 3.4*      HbA1C: No results for input(s): "HGBA1C" in the last 72 hours.   CBG: Recent Labs  Lab 10/20/22 0625 10/20/22 1137 10/20/22 1612 10/20/22 2106 10/21/22 0605  GLUCAP 81 157* 197* 143* 175*     Lipid Profile: No results for input(s): "CHOL", "HDL", "LDLCALC", "TRIG", "CHOLHDL", "LDLDIRECT" in the last 72 hours.    Anemia Panel: No results for input(s): "VITAMINB12", "FOLATE", "FERRITIN", "TIBC", "IRON", "RETICCTPCT" in the last 72 hours.  Radiology Studies: No results found.     LOS: 7 days   [facility] Pager on [email or web address]  10/21/2022, 7:43 AM

## 2022-10-22 DIAGNOSIS — I613 Nontraumatic intracerebral hemorrhage in brain stem: Secondary | ICD-10-CM | POA: Diagnosis not present

## 2022-10-22 LAB — GLUCOSE, CAPILLARY
Glucose-Capillary: 190 mg/dL — ABNORMAL HIGH (ref 70–99)
Glucose-Capillary: 136 mg/dL — ABNORMAL HIGH (ref 70–99)
Glucose-Capillary: 245 mg/dL — ABNORMAL HIGH (ref 70–99)

## 2022-10-22 NOTE — Progress Notes (Signed)
[facility] PROGRESS NOTE   [name] Gilbert [id number] DOB: Feb 19, 1961 DOA: 10/14/2022  PCP: Christella Scheuermann, MD  Brief History/Interval Summary:  62 y.o. woman with a past medical history significant for diabetes, hypertension, hyperlipidemia, prior strokes, visual impairment, CKD transferred from [facility] on 2/4 after presenting with 24 hours of diffuse increasing weakness and lethargy, EMS called by her husband.  Per husband patient is not compliant with blood pressure medications as well as antiplatelet agents, aspirin and Plavix, at home.  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    Patient medically stable for discharge, continues to improve in mentation, will likely have prolonged recovery stage given hemorrhagic [facility]. Currently awaiting safe disposition (SNF recommended) - case mgmt looking for facility close to husband per his request.  Consultants: Neurology  Procedures: Echocardiogram.  Carotid Dopplers  Subjective/Interval History: No acute issues or events overnight, patient's mental status appears to be stable, she appears to be unwilling to answer questions today stating "I do not know" no matter what the question is.  This is likely her new baseline.  Assessment/Plan:  Acute CVA hemorrhagic and ischemic  Repeat CT head stable hemorrhage noted MRI also showed acute infarcts. Echocardiogram shows normal systolic function LDL elevated - statin allergy listed with myopathy but has been prescribed rosuvastatin prior to admission.  Per husband she has been on markedly noncompliant with all her medications -would recommend to continue this medication moving forward HbA1c 7.8. Seen by PT and OT.  SNF is recommended for short-term rehab. Neurology recommending 81 mg aspirin daily  Profoundly noncompliant Patient continues to refuse medications on occasion  - husband concerned patient will not continue medications at home despite stroke - we discussed we cannot force her to take medications/treatment if she refuses.  Essential hypertension Noted to be on amlodipine, spironolactone and lisinopril.  Monitor blood pressures closely. Patient was on Cleviprex in the ICU. Stop spironolactone and lisinopril due to rise in creatinine.  DC PRN clonidine - rebound HTN this morning from overnight dose - will need to see where her BP sits without this medication  Hyperlipidemia LDL levels 241.  Patient has not been compliant with her home medications.  Resume rosuvastatin.  Poor oral intake Continues to refuse food, patient's husband indicates she is quite picky, recommended family bring in food from outside otherwise we will need to place NG tube and discussed tube feeds moving forward although will need to have goals of care discussion given patient's DNR status she may not wish to be kept alive "artificially" via tube feeds.  Questionable history of seizure disorder Noted to be on Keppra prior to admission which is being continued.  Diabetes mellitus, uncontrolled with hyperglycemia Continue with glargine and SSI.  HbA1c 7.8.  Monitor CBG/hypoglycemic protocol.  Chronic kidney disease stage IV Baseline creatinine is between 2.2-2.6.   On ACE inhibitor and spironolactone prior to admission, currently on hold - likely resume in the next 24-48h  DVT Prophylaxis: Subcutaneous heparin Code Status: DNR Family Communication: Husband Disposition Plan: Skilled nursing facility  Status is: Inpatient Remains inpatient appropriate because: Acute stroke    Medications: Scheduled:  amLODipine  10 mg Oral Daily   aspirin EC  81 mg Oral Daily   heparin injection (subcutaneous)  5,000 Units Subcutaneous Q8H   insulin aspart  0-9 Units Subcutaneous TID WC   levETIRAcetam  250 mg Oral BID  pantoprazole  40 mg Oral QHS   rosuvastatin  40 mg Oral QHS    senna-docusate  1 tablet Oral BID   [id number] **OR** acetaminophen (TYLENOL) oral liquid 160 mg/5 mL **OR** acetaminophen  Antibiotics: Anti-infectives (From admission, onward)    None       Objective:  Vital Signs  Vitals:   10/21/22 1635 10/21/22 1943 10/21/22 2335 10/22/22 0421  BP: (!) 142/62 137/77 129/64 129/66  Pulse: 73 74 63 64  Resp: 17 16 14 14  $ Temp: 97.6 F (36.4 C) 98.5 F (36.9 C) 97.7 F (36.5 C) 97.6 F (36.4 C)  TempSrc: Oral Axillary Axillary Axillary  SpO2: 100% 100% 100% 100%  Weight:      Height:        Intake/Output Summary (Last 24 hours) at 10/22/2022 0749 Last data filed at 10/22/2022 0437 Gross per 24 hour  Intake 290 ml  Output 320 ml  Net -30 ml    Filed Weights   10/17/22 0500  Weight: (P) 72.6 kg    General appearance: Awake alert to person family and situation.  In no distress. Poorly verbal, questionably behavioral Resp: Poor effort.  Poor air entry at the bases.  No crackles wheezing or rhonchi. Cardio: S1-S2 is normal regular.  No S3-S4.  No rubs murmurs or bruit GI: Abdomen is soft.  Nontender nondistended.  Bowel sounds are present normal.  No masses organomegaly Extremities: No edema.  Physical deconditioning noted.  Lab Results:  Data Reviewed: I have personally reviewed following labs and reports of the imaging studies  CBC: Recent Labs  Lab 10/16/22 0842 10/17/22 0624  WBC 5.7 6.3  NEUTROABS 3.9 3.2  HGB 10.3* 12.6  HCT 31.8* 39.0  MCV 88.6 87.8  PLT 195 291     Basic Metabolic Panel: Recent Labs  Lab 10/16/22 1132 10/17/22 0624 10/18/22 0544  NA 139 138 136  K 5.8* 4.1 4.9  CL 109 105 103  CO2 18* 24 23  GLUCOSE 111* 140* 199*  BUN 36* 33* 31*  CREATININE 2.21* 2.39* 2.31*  CALCIUM 8.8* 8.6* 8.2*     GFR: Estimated Creatinine Clearance: 23 mL/min (A) (by C-G formula based on SCr of 2.31 mg/dL (H)).  Liver Function Tests: No results for input(s): "AST", "ALT", "ALKPHOS", "BILITOT",  "PROT", "ALBUMIN" in the last 168 hours.    HbA1C: No results for input(s): "HGBA1C" in the last 72 hours.   CBG: Recent Labs  Lab 10/20/22 1612 10/20/22 2106 10/21/22 0605 10/21/22 1235 10/21/22 1638  GLUCAP 197* 143* 175* 233* 238*     Lipid Profile: No results for input(s): "CHOL", "HDL", "LDLCALC", "TRIG", "CHOLHDL", "LDLDIRECT" in the last 72 hours.    Anemia Panel: No results for input(s): "VITAMINB12", "FOLATE", "FERRITIN", "TIBC", "IRON", "RETICCTPCT" in the last 72 hours.  Radiology Studies: No results found.     LOS: 8 days   [facility] Pager on [email or web address]  10/22/2022, 7:49 AM

## 2022-10-22 NOTE — Progress Notes (Signed)
Patient refusing to be turned every two hours. Importance of being turned was explained to the patient. Stated she wants to "sleep and not to be bothered."

## 2022-10-22 NOTE — Progress Notes (Signed)
Patient refusing mobility q 2 hours. States "I want to sleep".

## 2022-10-22 NOTE — Plan of Care (Signed)
  Problem: Education: Goal: Knowledge of disease or condition will improve Outcome: Not Progressing Goal: Knowledge of secondary prevention will improve (MUST DOCUMENT ALL) Outcome: Not Progressing Goal: Knowledge of patient specific risk factors will improve [place]ta [place] N/A or DELETE if not current risk factor) Outcome: Not Progressing   Problem: Intracerebral Hemorrhage Tissue Perfusion: Goal: Complications of Intracerebral Hemorrhage will be minimized Outcome: Not Progressing   Problem: Coping: Goal: Will verbalize positive feelings about self Outcome: Not Progressing Goal: Will identify appropriate support needs Outcome: Not Progressing   Problem: Health Behavior/Discharge Planning: Goal: Ability to manage health-related needs will improve Outcome: Not Progressing Goal: Goals will be collaboratively established with patient/family Outcome: Not Progressing   Problem: Self-Care: Goal: Ability to participate in self-care as condition permits will improve Outcome: Not Progressing Goal: Verbalization of feelings and concerns over difficulty with self-care will improve Outcome: Not Progressing Goal: Ability to communicate needs accurately will improve Outcome: Not Progressing   Problem: Nutrition: Goal: Risk of aspiration will decrease Outcome: Not Progressing Goal: Dietary intake will improve Outcome: Not Progressing   Problem: Education: Goal: Ability to describe self-care measures that may prevent or decrease complications (Diabetes Survival Skills Education) will improve Outcome: Not Progressing Goal: Individualized Educational Video(s) Outcome: Not Progressing   Problem: Coping: Goal: Ability to adjust to condition or change in health will improve Outcome: Not Progressing   Problem: Fluid Volume: Goal: Ability to maintain a balanced intake and output will improve Outcome: Not Progressing   Problem: Health Behavior/Discharge Planning: Goal: Ability to identify and  utilize available resources and services will improve Outcome: Not Progressing Goal: Ability to manage health-related needs will improve Outcome: Not Progressing   Problem: Metabolic: Goal: Ability to maintain appropriate glucose levels will improve Outcome: Not Progressing   Problem: Nutritional: Goal: Maintenance of adequate nutrition will improve Outcome: Not Progressing Goal: Progress toward achieving an optimal weight will improve Outcome: Not Progressing   Problem: Skin Integrity: Goal: Risk for impaired skin integrity will decrease Outcome: Not Progressing   Problem: Tissue Perfusion: Goal: Adequacy of tissue perfusion will improve Outcome: Not Progressing   Problem: Education: Goal: Knowledge of General Education information will improve Description: Including pain rating scale, medication(s)/side effects and non-pharmacologic comfort measures Outcome: Not Progressing   Problem: Health Behavior/Discharge Planning: Goal: Ability to manage health-related needs will improve Outcome: Not Progressing   Problem: Clinical Measurements: Goal: Ability to maintain clinical measurements within normal limits will improve Outcome: Not Progressing Goal: Will remain free from infection Outcome: Not Progressing Goal: Diagnostic test results will improve Outcome: Not Progressing Goal: Respiratory complications will improve Outcome: Not Progressing Goal: Cardiovascular complication will be avoided Outcome: Not Progressing   Problem: Activity: Goal: Risk for activity intolerance will decrease Outcome: Not Progressing   Problem: Nutrition: Goal: Adequate nutrition will be maintained Outcome: Not Progressing   Problem: Coping: Goal: Level of anxiety will decrease Outcome: Not Progressing   Problem: Elimination: Goal: Will not experience complications related to bowel motility Outcome: Not Progressing Goal: Will not experience complications related to urinary  retention Outcome: Not Progressing   Problem: Pain Managment: Goal: General experience of comfort will improve Outcome: Not Progressing   Problem: Safety: Goal: Ability to remain free from injury will improve Outcome: Not Progressing   Problem: Skin Integrity: Goal: Risk for impaired skin integrity will decrease Outcome: Not Progressing

## 2022-10-22 NOTE — Plan of Care (Signed)
  Problem: Education: Goal: Knowledge of disease or condition will improve Outcome: Progressing Goal: Knowledge of secondary prevention will improve (MUST DOCUMENT ALL) Outcome: Progressing Goal: Knowledge of patient specific risk factors will improve (Mark N/A or DELETE if not current risk factor) Outcome: Progressing   Problem: Intracerebral Hemorrhage Tissue Perfusion: Goal: Complications of Intracerebral Hemorrhage will be minimized Outcome: Progressing   Problem: Coping: Goal: Will verbalize positive feelings about self Outcome: Progressing Goal: Will identify appropriate support needs Outcome: Progressing   Problem: Health Behavior/Discharge Planning: Goal: Ability to manage health-related needs will improve Outcome: Progressing Goal: Goals will be collaboratively established with patient/family Outcome: Progressing   Problem: Self-Care: Goal: Ability to participate in self-care as condition permits will improve Outcome: Progressing Goal: Verbalization of feelings and concerns over difficulty with self-care will improve Outcome: Progressing Goal: Ability to communicate needs accurately will improve Outcome: Progressing   Problem: Nutrition: Goal: Risk of aspiration will decrease Outcome: Progressing Goal: Dietary intake will improve Outcome: Progressing   Problem: Education: Goal: Ability to describe self-care measures that may prevent or decrease complications (Diabetes Survival Skills Education) will improve Outcome: Progressing Goal: Individualized Educational Video(s) Outcome: Progressing   Problem: Coping: Goal: Ability to adjust to condition or change in health will improve Outcome: Progressing   Problem: Fluid Volume: Goal: Ability to maintain a balanced intake and output will improve Outcome: Progressing   Problem: Health Behavior/Discharge Planning: Goal: Ability to identify and utilize available resources and services will improve Outcome:  Progressing Goal: Ability to manage health-related needs will improve Outcome: Progressing   Problem: Metabolic: Goal: Ability to maintain appropriate glucose levels will improve Outcome: Progressing   Problem: Nutritional: Goal: Maintenance of adequate nutrition will improve Outcome: Progressing Goal: Progress toward achieving an optimal weight will improve Outcome: Progressing   Problem: Skin Integrity: Goal: Risk for impaired skin integrity will decrease Outcome: Progressing   Problem: Tissue Perfusion: Goal: Adequacy of tissue perfusion will improve Outcome: Progressing   Problem: Education: Goal: Knowledge of General Education information will improve Description: Including pain rating scale, medication(s)/side effects and non-pharmacologic comfort measures Outcome: Progressing   Problem: Health Behavior/Discharge Planning: Goal: Ability to manage health-related needs will improve Outcome: Progressing   Problem: Clinical Measurements: Goal: Ability to maintain clinical measurements within normal limits will improve Outcome: Progressing Goal: Will remain free from infection Outcome: Progressing Goal: Diagnostic test results will improve Outcome: Progressing Goal: Respiratory complications will improve Outcome: Progressing Goal: Cardiovascular complication will be avoided Outcome: Progressing   Problem: Activity: Goal: Risk for activity intolerance will decrease Outcome: Progressing   Problem: Nutrition: Goal: Adequate nutrition will be maintained Outcome: Progressing   Problem: Coping: Goal: Level of anxiety will decrease Outcome: Progressing   Problem: Elimination: Goal: Will not experience complications related to bowel motility Outcome: Progressing Goal: Will not experience complications related to urinary retention Outcome: Progressing   Problem: Pain Managment: Goal: General experience of comfort will improve Outcome: Progressing   Problem:  Safety: Goal: Ability to remain free from injury will improve Outcome: Progressing   Problem: Skin Integrity: Goal: Risk for impaired skin integrity will decrease Outcome: Progressing   

## 2022-10-22 NOTE — Plan of Care (Signed)
  Problem: Education: Goal: Knowledge of disease or condition will improve Outcome: Progressing Goal: Knowledge of secondary prevention will improve (MUST DOCUMENT ALL) Outcome: Progressing Goal: Knowledge of patient specific risk factors will improve (Mark N/A or DELETE if not current risk factor) Outcome: Progressing   Problem: Intracerebral Hemorrhage Tissue Perfusion: Goal: Complications of Intracerebral Hemorrhage will be minimized Outcome: Progressing   Problem: Coping: Goal: Will verbalize positive feelings about self Outcome: Progressing Goal: Will identify appropriate support needs Outcome: Progressing   Problem: Health Behavior/Discharge Planning: Goal: Ability to manage health-related needs will improve Outcome: Progressing Goal: Goals will be collaboratively established with patient/family Outcome: Progressing   Problem: Self-Care: Goal: Ability to participate in self-care as condition permits will improve Outcome: Progressing Goal: Verbalization of feelings and concerns over difficulty with self-care will improve Outcome: Progressing Goal: Ability to communicate needs accurately will improve Outcome: Progressing   Problem: Nutrition: Goal: Risk of aspiration will decrease Outcome: Progressing Goal: Dietary intake will improve Outcome: Progressing   Problem: Education: Goal: Ability to describe self-care measures that may prevent or decrease complications (Diabetes Survival Skills Education) will improve Outcome: Progressing Goal: Individualized Educational Video(s) Outcome: Progressing   Problem: Coping: Goal: Ability to adjust to condition or change in health will improve Outcome: Progressing   Problem: Fluid Volume: Goal: Ability to maintain a balanced intake and output will improve Outcome: Progressing   Problem: Health Behavior/Discharge Planning: Goal: Ability to identify and utilize available resources and services will improve Outcome:  Progressing Goal: Ability to manage health-related needs will improve Outcome: Progressing   Problem: Metabolic: Goal: Ability to maintain appropriate glucose levels will improve Outcome: Progressing   Problem: Nutritional: Goal: Maintenance of adequate nutrition will improve Outcome: Progressing Goal: Progress toward achieving an optimal weight will improve Outcome: Progressing   Problem: Skin Integrity: Goal: Risk for impaired skin integrity will decrease Outcome: Progressing   Problem: Tissue Perfusion: Goal: Adequacy of tissue perfusion will improve Outcome: Progressing   Problem: Education: Goal: Knowledge of General Education information will improve Description: Including pain rating scale, medication(s)/side effects and non-pharmacologic comfort measures Outcome: Progressing   Problem: Health Behavior/Discharge Planning: Goal: Ability to manage health-related needs will improve Outcome: Progressing   Problem: Clinical Measurements: Goal: Ability to maintain clinical measurements within normal limits will improve Outcome: Progressing Goal: Will remain free from infection Outcome: Progressing Goal: Diagnostic test results will improve Outcome: Progressing Goal: Respiratory complications will improve Outcome: Progressing Goal: Cardiovascular complication will be avoided Outcome: Progressing   Problem: Activity: Goal: Risk for activity intolerance will decrease Outcome: Progressing   Problem: Nutrition: Goal: Adequate nutrition will be maintained Outcome: Progressing   Problem: Coping: Goal: Level of anxiety will decrease Outcome: Progressing   Problem: Elimination: Goal: Will not experience complications related to bowel motility Outcome: Progressing Goal: Will not experience complications related to urinary retention Outcome: Progressing   

## 2022-10-23 ENCOUNTER — Inpatient Hospital Stay (HOSPITAL_COMMUNITY): Payer: Medicare HMO

## 2022-10-23 ENCOUNTER — Encounter (HOSPITAL_COMMUNITY): Payer: Self-pay | Admitting: Neurology

## 2022-10-23 ENCOUNTER — Other Ambulatory Visit: Payer: Self-pay

## 2022-10-23 DIAGNOSIS — I613 Nontraumatic intracerebral hemorrhage in brain stem: Secondary | ICD-10-CM | POA: Diagnosis not present

## 2022-10-23 LAB — GLUCOSE, CAPILLARY
Glucose-Capillary: 214 mg/dL — ABNORMAL HIGH (ref 70–99)
Glucose-Capillary: 225 mg/dL — ABNORMAL HIGH (ref 70–99)
Glucose-Capillary: 305 mg/dL — ABNORMAL HIGH (ref 70–99)
Glucose-Capillary: 107 mg/dL — ABNORMAL HIGH (ref 70–99)
Glucose-Capillary: 135 mg/dL — ABNORMAL HIGH (ref 70–99)

## 2022-10-23 MED ORDER — BISACODYL 10 MG RE SUPP
10.0000 mg | Freq: Once | RECTAL | Status: AC
  Administered 2022-10-23: 10 mg via RECTAL
  Filled 2022-10-23: qty 1

## 2022-10-23 NOTE — Progress Notes (Signed)
[facility] PROGRESS NOTE   [name] Gilbert [id number] DOB: 1961-06-04 DOA: 10/14/2022  PCP: Christella Scheuermann, MD  Brief History/Interval Summary:  62 y.o. woman with a past medical history significant for diabetes, hypertension, hyperlipidemia, prior strokes, visual impairment, CKD transferred from [facility] on 2/4 after presenting with 24 hours of diffuse increasing weakness and lethargy, EMS called by her husband.  Per husband patient is not compliant with blood pressure medications as well as antiplatelet agents, aspirin and Plavix, at home.  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    Patient medically stable for discharge, continues to improve in mentation slowly, will likely have prolonged recovery stage given hemorrhagic CVA. Currently awaiting safe disposition (SNF recommended) - case mgmt looking for facility close to husband per his request.  Consultants: Neurology  Procedures: Echocardiogram.  Carotid Dopplers  Subjective/Interval History: No acute issues or events overnight, patient's mental status appears to be stable, she answers questions appropriately but slowly, remains oriented to person and situation.  This is likely close to her new baseline.  Assessment/Plan:  Acute CVA hemorrhagic and ischemic  Repeat CT head stable hemorrhage noted MRI also showed acute infarcts. Echocardiogram shows normal systolic function LDL elevated - statin allergy listed with myopathy but has been prescribed rosuvastatin prior to admission.  Per husband she has been on markedly noncompliant with all her medications -would recommend to continue this medication moving forward HbA1c 7.8. Seen by PT and OT.  SNF is recommended for short-term rehab. Neurology recommending 81 mg aspirin daily  Profoundly noncompliant Patient continues to refuse medications on occasion -  husband concerned patient will not continue medications at home despite stroke - we discussed we cannot force her to take medications/treatment if she refuses.  Essential hypertension Noted to be on amlodipine, spironolactone and lisinopril.  Monitor blood pressures closely. Patient was on Cleviprex in the ICU. Stop spironolactone and lisinopril due to rise in creatinine.  DC PRN clonidine - rebound HTN this morning from overnight dose - will need to see where her BP sits without this medication  Hyperlipidemia LDL levels 241.  Patient has not been compliant with her home medications.  Resume rosuvastatin.  Poor oral intake Continues to refuse food, patient's husband indicates she is quite picky, recommended family bring in food from outside otherwise we will need to place NG tube and discussed tube feeds moving forward although will need to have goals of care discussion given patient's DNR status she may not wish to be kept alive "artificially" via tube feeds.  Questionable history of seizure disorder Noted to be on Keppra prior to admission which is being continued.  Diabetes mellitus, uncontrolled with hyperglycemia Continue with glargine and SSI.  HbA1c 7.8.  Monitor CBG/hypoglycemic protocol.  Chronic kidney disease stage IV Baseline creatinine is between 2.2-2.6.   On ACE inhibitor and spironolactone prior to admission, currently on hold - likely resume in the next 24-48h  DVT Prophylaxis: Subcutaneous heparin Code Status: DNR Family Communication: Husband Disposition Plan: Skilled nursing facility - medically stable for discharge - awaiting facility  Status is: Inpatient Remains inpatient appropriate because: Acute stroke    Medications: Scheduled:  amLODipine  10 mg Oral Daily   aspirin EC  81 mg Oral Daily   heparin injection (subcutaneous)  5,000 Units Subcutaneous Q8H   insulin aspart  0-9 Units Subcutaneous TID WC   levETIRAcetam  250 mg Oral  BID   pantoprazole  40  mg Oral QHS   rosuvastatin  40 mg Oral QHS   senna-docusate  1 tablet Oral BID   [id number] **OR** acetaminophen (TYLENOL) oral liquid 160 mg/5 mL **OR** acetaminophen  Antibiotics: Anti-infectives (From admission, onward)    None       Objective:  Vital Signs  Vitals:   10/22/22 1125 10/22/22 1633 10/22/22 2124 10/23/22 0717  BP: (!) 120/55 (!) 126/113 (!) 154/73 (!) 184/81  Pulse: 69 74 79 93  Resp: 17 20 15 16  $ Temp: 98 F (36.7 C) 98.1 F (36.7 C) 98.3 F (36.8 C) (!) 97.5 F (36.4 C)  TempSrc: Oral Oral Axillary Oral  SpO2: 100% 100% 98% 100%  Weight:      Height:        Intake/Output Summary (Last 24 hours) at 10/23/2022 0803 Last data filed at 10/22/2022 1849 Gross per 24 hour  Intake 490 ml  Output 550 ml  Net -60 ml    Filed Weights   10/17/22 0500  Weight: (P) 72.6 kg    General appearance: Awake alert to person family and situation. In no distress. Poorly verbal/delayed responses but more appropriate Resp: Poor effort.  Poor air entry at the bases.  No crackles wheezing or rhonchi. Cardio: S1-S2 is normal regular.  No S3-S4.  No rubs murmurs or bruit GI: Abdomen is soft.  Nontender nondistended.  Bowel sounds are present normal.  No masses organomegaly Extremities: No edema.  Physical deconditioning noted.  Lab Results:  Data Reviewed: I have personally reviewed following labs and reports of the imaging studies  CBC: Recent Labs  Lab 10/16/22 0842 10/17/22 0624  WBC 5.7 6.3  NEUTROABS 3.9 3.2  HGB 10.3* 12.6  HCT 31.8* 39.0  MCV 88.6 87.8  PLT 195 291     Basic Metabolic Panel: Recent Labs  Lab 10/16/22 1132 10/17/22 0624 10/18/22 0544  NA 139 138 136  K 5.8* 4.1 4.9  CL 109 105 103  CO2 18* 24 23  GLUCOSE 111* 140* 199*  BUN 36* 33* 31*  CREATININE 2.21* 2.39* 2.31*  CALCIUM 8.8* 8.6* 8.2*     GFR: Estimated Creatinine Clearance: 23 mL/min (A) (by C-G formula based on SCr of 2.31 mg/dL (H)).  Liver Function  Tests: No results for input(s): "AST", "ALT", "ALKPHOS", "BILITOT", "PROT", "ALBUMIN" in the last 168 hours.    HbA1C: No results for input(s): "HGBA1C" in the last 72 hours.   CBG: Recent Labs  Lab 10/21/22 1638 10/22/22 0815 10/22/22 1636 10/22/22 2143 10/23/22 0624  GLUCAP 238* 190* 136* 245* 225*     Lipid Profile: No results for input(s): "CHOL", "HDL", "LDLCALC", "TRIG", "CHOLHDL", "LDLDIRECT" in the last 72 hours.    Anemia Panel: No results for input(s): "VITAMINB12", "FOLATE", "FERRITIN", "TIBC", "IRON", "RETICCTPCT" in the last 72 hours.  Radiology Studies: No results found.     LOS: 9 days   [facility] Pager on [email or web address]  10/23/2022, 8:03 AM

## 2022-10-23 NOTE — Progress Notes (Signed)
Mobility Specialist: Progress Note   10/23/22 1104  Mobility  Activity Transferred to/from [facility]  Level of Assistance +2 (takes two people)  Assistive Device Other (Comment) (HHA)  Distance Ambulated (ft) 6 ft (2'+2'+2')  Activity Response Tolerated fair  Mobility Referral Yes  $Mobility charge 1 Mobility   Pt received in the bed and agreeable to mobility. ModA with bed mobility with cues for hand placement. C/o mild dizziness upon sitting EOB, resolved quickly. Transferred B>C, C>BSC, and BSC>C w/ +2 HHA modA. No c/o throughout. Physical assist for weight shifting to advance BLE. Pt is in the chair with call bell in her lap. Chair alarm is on.   [place] [name] Gilbert Mobility Specialist Please contact via SecureChat or Rehab office at [phone]

## 2022-10-23 NOTE — Progress Notes (Addendum)
Patient confused, attempting to get out of the bed during multiple times. Patient not following commands, was repositioned back in her bed with her bed alarm on in sensitive setting. Provider was notified.

## 2022-10-23 NOTE — Plan of Care (Signed)
  Problem: Education: Goal: Knowledge of disease or condition will improve Outcome: Progressing Goal: Knowledge of secondary prevention will improve (MUST DOCUMENT ALL) Outcome: Progressing Goal: Knowledge of patient specific risk factors will improve (Mark N/A or DELETE if not current risk factor) Outcome: Progressing   Problem: Intracerebral Hemorrhage Tissue Perfusion: Goal: Complications of Intracerebral Hemorrhage will be minimized Outcome: Progressing   Problem: Coping: Goal: Will verbalize positive feelings about self Outcome: Progressing Goal: Will identify appropriate support needs Outcome: Progressing   Problem: Health Behavior/Discharge Planning: Goal: Ability to manage health-related needs will improve Outcome: Progressing Goal: Goals will be collaboratively established with patient/family Outcome: Progressing   Problem: Self-Care: Goal: Ability to participate in self-care as condition permits will improve Outcome: Progressing Goal: Verbalization of feelings and concerns over difficulty with self-care will improve Outcome: Progressing Goal: Ability to communicate needs accurately will improve Outcome: Progressing   Problem: Nutrition: Goal: Risk of aspiration will decrease Outcome: Progressing Goal: Dietary intake will improve Outcome: Progressing   Problem: Education: Goal: Ability to describe self-care measures that may prevent or decrease complications (Diabetes Survival Skills Education) will improve Outcome: Progressing Goal: Individualized Educational Video(s) Outcome: Progressing   Problem: Coping: Goal: Ability to adjust to condition or change in health will improve Outcome: Progressing   Problem: Fluid Volume: Goal: Ability to maintain a balanced intake and output will improve Outcome: Progressing   Problem: Health Behavior/Discharge Planning: Goal: Ability to identify and utilize available resources and services will improve Outcome:  Progressing Goal: Ability to manage health-related needs will improve Outcome: Progressing   Problem: Metabolic: Goal: Ability to maintain appropriate glucose levels will improve Outcome: Progressing   Problem: Nutritional: Goal: Maintenance of adequate nutrition will improve Outcome: Progressing Goal: Progress toward achieving an optimal weight will improve Outcome: Progressing   Problem: Skin Integrity: Goal: Risk for impaired skin integrity will decrease Outcome: Progressing   Problem: Tissue Perfusion: Goal: Adequacy of tissue perfusion will improve Outcome: Progressing   Problem: Education: Goal: Knowledge of General Education information will improve Description: Including pain rating scale, medication(s)/side effects and non-pharmacologic comfort measures Outcome: Progressing   Problem: Health Behavior/Discharge Planning: Goal: Ability to manage health-related needs will improve Outcome: Progressing   Problem: Clinical Measurements: Goal: Ability to maintain clinical measurements within normal limits will improve Outcome: Progressing Goal: Will remain free from infection Outcome: Progressing Goal: Diagnostic test results will improve Outcome: Progressing Goal: Respiratory complications will improve Outcome: Progressing Goal: Cardiovascular complication will be avoided Outcome: Progressing   Problem: Activity: Goal: Risk for activity intolerance will decrease Outcome: Progressing   Problem: Nutrition: Goal: Adequate nutrition will be maintained Outcome: Progressing   Problem: Coping: Goal: Level of anxiety will decrease Outcome: Progressing   Problem: Elimination: Goal: Will not experience complications related to bowel motility Outcome: Progressing Goal: Will not experience complications related to urinary retention Outcome: Progressing   

## 2022-10-23 NOTE — TOC Progression Note (Signed)
Transition of Care [facility]) - Progression Note    Patient Details  Name: Chyann Mensinger MRN: [id number] Date of Birth: Jul 27, 1961  Transition of Care [facility]) CM/SW [place], [place] Phone Number: 10/23/2022, 3:42 PM  Clinical Narrative:   CSW received denials from [facility] and [facility]. CSW spoke with spouse, Herbie Baltimore, about [facility] being the only acceptance. Herbie Baltimore asked how far that was, and unhappy about a 45 minute drive, asking if anything in [place] might be closer. CSW faxed out referral in [place], awaiting responses. Herbie Baltimore called back and said he changed his mind and [facility] would be fine. CSW requested initiation of insurance authorization for SNF. CSW to follow.    Expected Discharge Plan: [place] Barriers to Discharge: Continued Medical Work up, Orthoptist and [place] Choice: [place] Living arrangements for the past 2 months: Single Family Home                                       Social Determinants of Health (SDOH) Interventions SDOH Screenings   Tobacco Use: Low Risk  (10/14/2022)    Readmission Risk Interventions     No data to display

## 2022-10-23 NOTE — Inpatient Diabetes Management (Addendum)
Inpatient Diabetes Program Recommendations  AACE/ADA: New Consensus Statement on Inpatient Glycemic Control (2015)  Target Ranges:  Prepandial:   less than 140 mg/dL      Peak postprandial:   less than 180 mg/dL (1-2 hours)      Critically ill patients:  140 - 180 mg/dL   Lab Results  Component Value Date   GLUCAP 305 (H) 10/23/2022   HGBA1C 7.8 (H) 10/15/2022    Review of Glycemic Control  Latest Reference Range & Units 10/22/22 08:15 10/22/22 11:28 10/22/22 16:36 10/22/22 21:43 10/23/22 06:24 10/23/22 11:20  Glucose-Capillary 70 - 99 mg/dL 190 (H)  Novolog 2 units 214 (H)  Novolog 3 units 136 (H)  Novolog 1 unit 245 (H) 225 (H)  Novolog 3 units given late at 1006 305 (H)  Novolog 7 units given at 1246   Diabetes history: DM 2 Outpatient Diabetes medications: basal insulin in the past Current orders for Inpatient glycemic control:  Novolog 0-9 units tid  A1c 7.8% on 2/4  Inpatient Diabetes Program Recommendations:    -  Add Novolog hs scale -  Consider adding back Semglee 5 units  Thanks,  Tama Headings RN, MSN, BC-ADM Inpatient Diabetes Coordinator Team Pager [phone] (8a-5p)

## 2022-10-24 DIAGNOSIS — I613 Nontraumatic intracerebral hemorrhage in brain stem: Secondary | ICD-10-CM | POA: Diagnosis not present

## 2022-10-24 LAB — GLUCOSE, CAPILLARY
Glucose-Capillary: 185 mg/dL — ABNORMAL HIGH (ref 70–99)
Glucose-Capillary: 153 mg/dL — ABNORMAL HIGH (ref 70–99)
Glucose-Capillary: 177 mg/dL — ABNORMAL HIGH (ref 70–99)
Glucose-Capillary: 258 mg/dL — ABNORMAL HIGH (ref 70–99)
Glucose-Capillary: 284 mg/dL — ABNORMAL HIGH (ref 70–99)

## 2022-10-24 LAB — VITAMIN B1: Vitamin B1 (Thiamine): 70.9 nmol/L (ref 66.5–200.0)

## 2022-10-24 NOTE — Progress Notes (Signed)
Occupational Therapy Treatment Patient Details Name: [name] Gilbert MRN: [id number] DOB: [birth year] Today's Date: 10/24/2022   History of present illness [name] Gilbert is a 62 y.o. woman presenting to hospital with generalized weakness. CT: new infarct in the right basal ganglia and small hyperdense focus in the pons, suspicious for a small  pontine hemorrhage. 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 PHMx:diabetes, hypertension, hyperlipidemia, prior strokes, visual impairment, CKD, medication nonadherance   OT comments  This 62 yo female seen today in conjunction with PT to see if we could progress her mobility levels. She is still needing increased assistance and increased time to attempt/complete tasks. Today she was motivated to try and did better with grooming tasks (especially unsupported sitting EOB and used both UEs). She was also able to bear weight on Bil LEs for sit<>stand x3. Pt using RUE with cues to help support herself EOB and spontaneously using LUE to do so. She will continue to benefit from acute OT with follow up at SNF.   Recommendations for follow up therapy are one component of a multi-disciplinary discharge planning process, led by the attending physician.  Recommendations may be updated based on patient status, additional functional criteria and insurance authorization.    Follow Up Recommendations  Skilled nursing-short term rehab (<3 hours/day)     Assistance Recommended at Discharge Frequent or constant Supervision/Assistance  Patient can return home with the following  Two people to help with walking and/or transfers;Two people to help with bathing/dressing/bathroom;Assistance with cooking/housework;Assistance with feeding;Assist for transportation;Help with stairs or ramp for entrance;Direct supervision/assist for financial management;Direct supervision/assist for medications management   Equipment Recommendations  Other (comment) (TBD next venue)       Precautions /  Restrictions Precautions Precautions: Fall Restrictions Weight Bearing Restrictions: No       Mobility Bed Mobility Overal bed mobility: Needs Assistance Bed Mobility: Rolling, Sidelying to Sit Rolling: Total assist Sidelying to sit: Total assist       General bed mobility comments: Assist for bending up bilateral knees and rolling towards right, with hand over hand guidance for locating bed rail. Assist for BLE's off edge of bed and trunk to upright    Transfers Overall transfer level: Needs assistance Equipment used: 2 person hand held assist Transfers: Sit to/from Stand, Bed to chair/wheelchair/BSC Sit to Stand: Max assist, +2 physical assistance   Squat pivot transfers: Max assist, +2 physical assistance       General transfer comment: Manual assist for feet placement. MaxA + 2 to boost up from edge of bed x 2 to standing position with right knee block. Increased R knee hyperextension and flexed posture. Pivoting to chair with use of bed pad to guide hips on 3rd trial     Balance Overall balance assessment: Needs assistance Sitting-balance support: Feet supported, Single extremity supported Sitting balance-Leahy Scale: Poor Sitting balance - Comments: Reliant on at least single UE support, left lateral lean. Able to correct with cueing with increased time, but unable to sustain   Standing balance support: Bilateral upper extremity supported, During functional activity Standing balance-Leahy Scale: Zero                             ADL either performed or assessed with clinical judgement   ADL Overall ADL's : Needs assistance/impaired     Grooming: Wash/dry face;Oral care;Sitting Grooming Details (indicate cue type and reason): EOB, able to brush teeth with RUE once too brush placed in right hand,  total A to put toothpaste on toothbrush with LUE (hand over hand). Increased effort and time to bring toothbrush to mouth and brush, was thorough. Able to spit  in cup that therapist held for her. Sitting posture declined as she brushed her teeth (more anterior pelvic tilt). When presented with washcloth she attempted to bring RUE up to it, but then switched to LUE--able to wash face thoroughly sitting EOB again with postural changes as she continued to brush teeth.                 Toilet Transfer: Maximal assistance;+2 for physical assistance;Squat-pivot Toilet Transfer Details (indicate cue type and reason): simulated bed to recliner going to pt's right. Pt was able to bear weight on legs for sit>stand                Extremity/Trunk Assessment Upper Extremity Assessment Upper Extremity Assessment: RUE deficits/detail;LUE deficits/detail RUE Deficits / Details: Pt using her RUE to brush teeth once toothbrush placed in right hand, increased time and effort but able to do it' A'd to hold medicine up in right hand and she was able to bring it to her mouth (while seated EOB) RUE Coordination: decreased fine motor;decreased gross motor LUE Deficits / Details: Pt used LUE to wash face with washcloth once washcloth placed in her hand--increased time and effort, but thorough--while seated EOB LUE Coordination: decreased fine motor;decreased gross motor            Vision Baseline Vision/History:  (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, 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)            Cognition Arousal/Alertness: Awake/alert Behavior During Therapy: Flat affect Overall Cognitive Status: History of cognitive impairments - at baseline                                 General Comments: Significant delayed processing, flat affect, poor attention                 Frequency  Min 2X/week        Progress Toward Goals  OT Goals(current goals can now be found in the care plan section)  Progress towards OT goals: Progressing toward goals  Acute Rehab OT Goals OT Goal Formulation: With patient Time For Goal Achievement:  10/29/22 Potential to Achieve Goals: [place] Discharge plan remains appropriate    Co-evaluation    PT/OT/SLP Co-Evaluation/Treatment: Yes Reason for Co-Treatment: For patient/therapist safety;To address functional/ADL transfers PT goals addressed during session: Mobility/safety with mobility;Balance OT goals addressed during session: Strengthening/ROM;ADL's and self-care      AM-PAC OT "6 Clicks" Daily Activity     Outcome Measure   Help from another person eating meals?: A Lot Help from another person taking care of personal grooming?: A Lot Help from another person toileting, which includes using toliet, bedpan, or urinal?: Total Help from another person bathing (including washing, rinsing, drying)?: A Lot Help from another person to put on and taking off regular upper body clothing?: Total Help from another person to put on and taking off regular lower body clothing?: Total 6 Click Score: 9    End of Session Equipment Utilized During Treatment: Gait belt  OT Visit Diagnosis: Unsteadiness on feet (R26.81);Other abnormalities of gait and mobility (R26.89);Muscle weakness (generalized) (M62.81);Other symptoms and signs involving cognitive function;Hemiplegia and hemiparesis Hemiplegia - Right/Left: Right Hemiplegia - dominant/non-dominant: Dominant Hemiplegia - caused by: Cerebral infarction  Activity Tolerance Patient tolerated treatment well   Patient Left in chair;with call bell/phone within reach;with restraints reapplied (posey alarm belt)           Time: [id number] OT Time Calculation (min): 30 min  Charges: OT General Charges $OT Visit: 1 Visit OT Treatments $Self Care/Home Management : 8-22 mins  [place] Office [phone]   Almon Register 10/24/2022, 12:59 PM

## 2022-10-24 NOTE — Plan of Care (Signed)
  Problem: Education: Goal: Knowledge of disease or condition will improve Outcome: Progressing Goal: Knowledge of secondary prevention will improve (MUST DOCUMENT ALL) Outcome: Progressing Goal: Knowledge of patient specific risk factors will improve (Mark N/A or DELETE if not current risk factor) Outcome: Progressing   Problem: Intracerebral Hemorrhage Tissue Perfusion: Goal: Complications of Intracerebral Hemorrhage will be minimized Outcome: Progressing   Problem: Coping: Goal: Will verbalize positive feelings about self Outcome: Progressing Goal: Will identify appropriate support needs Outcome: Progressing   Problem: Health Behavior/Discharge Planning: Goal: Ability to manage health-related needs will improve Outcome: Progressing Goal: Goals will be collaboratively established with patient/family Outcome: Progressing   Problem: Self-Care: Goal: Ability to participate in self-care as condition permits will improve Outcome: Progressing Goal: Verbalization of feelings and concerns over difficulty with self-care will improve Outcome: Progressing Goal: Ability to communicate needs accurately will improve Outcome: Progressing   Problem: Nutrition: Goal: Risk of aspiration will decrease Outcome: Progressing Goal: Dietary intake will improve Outcome: Progressing   Problem: Education: Goal: Ability to describe self-care measures that may prevent or decrease complications (Diabetes Survival Skills Education) will improve Outcome: Progressing Goal: Individualized Educational Video(s) Outcome: Progressing   Problem: Coping: Goal: Ability to adjust to condition or change in health will improve Outcome: Progressing   Problem: Fluid Volume: Goal: Ability to maintain a balanced intake and output will improve Outcome: Progressing   Problem: Health Behavior/Discharge Planning: Goal: Ability to identify and utilize available resources and services will improve Outcome:  Progressing Goal: Ability to manage health-related needs will improve Outcome: Progressing   Problem: Metabolic: Goal: Ability to maintain appropriate glucose levels will improve Outcome: Progressing   Problem: Nutritional: Goal: Maintenance of adequate nutrition will improve Outcome: Progressing Goal: Progress toward achieving an optimal weight will improve Outcome: Progressing   Problem: Skin Integrity: Goal: Risk for impaired skin integrity will decrease Outcome: Progressing   Problem: Tissue Perfusion: Goal: Adequacy of tissue perfusion will improve Outcome: Progressing   Problem: Education: Goal: Knowledge of General Education information will improve Description: Including pain rating scale, medication(s)/side effects and non-pharmacologic comfort measures Outcome: Progressing   Problem: Health Behavior/Discharge Planning: Goal: Ability to manage health-related needs will improve Outcome: Progressing   Problem: Clinical Measurements: Goal: Ability to maintain clinical measurements within normal limits will improve Outcome: Progressing Goal: Will remain free from infection Outcome: Progressing Goal: Diagnostic test results will improve Outcome: Progressing Goal: Respiratory complications will improve Outcome: Progressing Goal: Cardiovascular complication will be avoided Outcome: Progressing   Problem: Activity: Goal: Risk for activity intolerance will decrease Outcome: Progressing   Problem: Nutrition: Goal: Adequate nutrition will be maintained Outcome: Progressing   Problem: Coping: Goal: Level of anxiety will decrease Outcome: Progressing   Problem: Elimination: Goal: Will not experience complications related to bowel motility Outcome: Progressing Goal: Will not experience complications related to urinary retention Outcome: Progressing   

## 2022-10-24 NOTE — Progress Notes (Signed)
Mobility Specialist: Progress Note   10/24/22 1616  Mobility  Activity Stood at bedside (x2)  Level of Assistance +2 (takes two people)  Assistive Device Other (Comment) (HHA)  Activity Response Tolerated poorly  Mobility Referral Yes  $Mobility charge 1 Mobility   Pt received in the bed, reluctant but agreeable to mobility. ModA with bed mobility and +2 maxA to stand secondary to fatigue. No c/o throughout. Stood x2 for 30 seconds each bout. Unable to stand fully upright despite verbal and tactile cues. Pt assisted back to bed after session with call bell and phone at her side. Bed alarm is on.   [place] Kailen Hinkle Mobility Specialist Please contact via SecureChat or Rehab office at [phone]

## 2022-10-24 NOTE — Progress Notes (Signed)
Speech Language Pathology Treatment: Dysphagia  Patient Details Name: [name] Gilbert MRN: [id number] DOB: 11/03/1960 Today's Date: 10/24/2022 Time: [id number] SLP Time Calculation (min) (ACUTE ONLY): 18 min  Assessment / Plan / Recommendation Clinical Impression  Pt upright in chair during session and observed with trials of thin liquids, puree, and Dys 3 type textures. Although she has a persistent cognitive impairment, she is able to eat and drink safely with cues as willing. Initiation and attention remain impaired, but pt benefited from visual, tactile, and hand-over-hand cueing as SLP assisted with feeding. Initially, SLP provided sips of water siphoned through a straw which appeared to improve pt's initiation in following trials as she independently took sips from the straw. Pt accepted bites of yogurt and banana and independently initiated mastication, however required cueing to ensure POs were fully masticated and swallowed. No overt s/s of aspiration noted during PO trials. Suspect pt is holding POs in her oral cavity and possibly pocketing, however, difficult to assess as pt intermittently followed commands to open her mouth. Recommend continuing regular diet with thin liquids and introducing appealing foods that increase pt's interest in eating. Will f/u to target attention, initiation, and overall interest in eating.    HPI HPI: [name] Gilbert is a 62 y.o. woman presenting to hospital with generalized weakness. CT: new infarct in the right basal ganglia and small hyperdense focus in the pons, suspicious for a small  pontine hemorrhage. 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 PHMx:diabetes, hypertension, hyperlipidemia, prior strokes, visual impairment, CKD, medication nonadherance      SLP Plan  Continue with current plan of care      Recommendations for follow up therapy are one component of a multi-disciplinary discharge planning process, led by the attending physician.  Recommendations  may be updated based on patient status, additional functional criteria and insurance authorization.    Recommendations  Diet recommendations: Regular;Thin liquid Liquids provided via: Straw;Cup Medication Administration: Whole meds with liquid Supervision: Staff to assist with self feeding;Full supervision/cueing for compensatory strategies Compensations: Minimize environmental distractions Postural Changes and/or Swallow Maneuvers: Seated upright 90 degrees                Oral Care Recommendations: Oral care BID Follow Up Recommendations: Skilled nursing-short term rehab (<3 hours/day) Assistance recommended at discharge: Frequent or constant Supervision/Assistance SLP Visit Diagnosis: Dysphagia, unspecified (R13.10);Cognitive communication deficit [id number]) Plan: Continue with current plan of care           Fabio Asa., Student SLP  10/24/2022, 12:17 PM

## 2022-10-24 NOTE — Progress Notes (Signed)
[facility] PROGRESS NOTE   [name] Gilbert [id number] DOB: July 14, 1961 DOA: 10/14/2022  PCP: Christella Scheuermann, MD  Brief History/Interval Summary:  62 y.o. woman with a past medical history significant for diabetes, hypertension, hyperlipidemia, prior strokes, visual impairment, CKD transferred from [facility] on 2/4 after presenting with 24 hours of diffuse increasing weakness and lethargy, EMS called by her husband.  Per husband patient is not compliant with blood pressure medications as well as antiplatelet agents, aspirin and Plavix, at home.  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    Patient medically stable for discharge, continues to improve in mentation slowly, will likely have prolonged recovery stage given hemorrhagic CVA. Currently awaiting safe disposition (SNF recommended) - case mgmt looking for facility close to husband per his request.  Consultants: Neurology  Procedures: Echocardiogram.  Carotid Dopplers  Subjective/Interval History: No acute issues or events overnight, patient's mental status appears to be stable, she answers questions appropriately but slowly, remains oriented to person and situation.  This is likely close to her new baseline.  Assessment/Plan:  Acute CVA hemorrhagic and ischemic  Repeat CT head stable hemorrhage noted MRI also showed acute infarcts. Echocardiogram shows normal systolic function LDL elevated - statin allergy listed with myopathy but has been prescribed rosuvastatin prior to admission.  Per husband she has been on markedly noncompliant with all her medications -would recommend to continue this medication moving forward HbA1c 7.8. Seen by PT and OT.  SNF is recommended for short-term rehab. Neurology recommending 81 mg aspirin daily  Profoundly noncompliant Patient continues to refuse medications on occasion -  husband concerned patient will not continue medications at home despite stroke - we discussed we cannot force her to take medications/treatment if she refuses. -She is poorly verbal again today - unclear if she has underlying depression - has not been followed in the outpatient setting for this per family.  Essential hypertension Noted to be on amlodipine, spironolactone and lisinopril.  Monitor blood pressures closely. Patient was on Cleviprex in the ICU. Stop spironolactone and lisinopril due to rise in creatinine.  DC PRN clonidine - rebound HTN this morning from overnight dose - will need to see where her BP sits without this medication  Hyperlipidemia LDL levels 241.  Patient has not been compliant with her home medications.  Resume rosuvastatin.  Poor oral intake Continues to refuse food, patient's husband indicates she is quite picky, recommended family bring in food from outside otherwise we will need to place NG tube and discussed tube feeds moving forward although will need to have goals of care discussion given patient's DNR status she may not wish to be kept alive "artificially" via tube feeds.  Questionable history of seizure disorder Noted to be on Keppra prior to admission which is being continued.  Diabetes mellitus, uncontrolled with hyperglycemia Continue with glargine and SSI.  HbA1c 7.8.  Monitor CBG/hypoglycemic protocol.  Chronic kidney disease stage IV Baseline creatinine is between 2.2-2.6.   On ACE inhibitor and spironolactone prior to admission, currently on hold - likely resume in the next 24-48h  DVT Prophylaxis: Subcutaneous heparin Code Status: DNR Family Communication: Husband Disposition Plan: Skilled nursing facility - medically stable for discharge - awaiting facility/authorization  Status is: Inpatient Remains inpatient appropriate because: Acute stroke  Medications: Scheduled:  amLODipine  10 mg Oral Daily   aspirin EC  81 mg Oral Daily  heparin injection (subcutaneous)  5,000 Units Subcutaneous Q8H   insulin aspart  0-9 Units Subcutaneous TID WC   levETIRAcetam  250 mg Oral BID   pantoprazole  40 mg Oral QHS   rosuvastatin  40 mg Oral QHS   senna-docusate  1 tablet Oral BID   [id number] **OR** acetaminophen (TYLENOL) oral liquid 160 mg/5 mL **OR** acetaminophen  Antibiotics: Anti-infectives (From admission, onward)    None       Objective:  Vital Signs  Vitals:   10/23/22 2346 10/24/22 0323 10/24/22 0430 10/24/22 0718  BP: (!) 160/83 138/75  135/76  Pulse: 84 80  73  Resp: 18 18  16  $ Temp: 98.6 F (37 C) 97.9 F (36.6 C)  97.9 F (36.6 C)  TempSrc:  Oral  Oral  SpO2: 100% 100%  100%  Weight:   70.3 kg   Height:        Intake/Output Summary (Last 24 hours) at 10/24/2022 0725 Last data filed at 10/23/2022 2023 Gross per 24 hour  Intake 120 ml  Output 450 ml  Net -330 ml    Filed Weights   10/17/22 0500 10/24/22 0430  Weight: (P) 72.6 kg 70.3 kg    General appearance: Awake alert to person family and situation. In no distress. Poorly verbal again today Resp: Poor effort.  Poor air entry at the bases.  No crackles wheezing or rhonchi. Cardio: S1-S2 is normal regular.  No S3-S4.  No rubs murmurs or bruit GI: Abdomen is soft.  Nontender nondistended.  Bowel sounds are present normal.  No masses organomegaly Extremities: No edema.  Physical deconditioning noted.  Lab Results:  Data Reviewed: I have personally reviewed following labs and reports of the imaging studies  CBC: No results for input(s): "WBC", "NEUTROABS", "HGB", "HCT", "MCV", "PLT" in the last 168 hours.   Basic Metabolic Panel: Recent Labs  Lab 10/18/22 0544  NA 136  K 4.9  CL 103  CO2 23  GLUCOSE 199*  BUN 31*  CREATININE 2.31*  CALCIUM 8.2*   GFR: Estimated Creatinine Clearance: 22.9 mL/min (A) (by C-G formula based on SCr of 2.31 mg/dL (H)).  CBG: Recent Labs  Lab 10/23/22 0624 10/23/22 1120  10/23/22 1613 10/23/22 2117 10/24/22 0609  GLUCAP 225* 305* 107* 135* 185*    Radiology Studies: DG Abd 1 View  Result Date: 10/23/2022 CLINICAL DATA:  Abdomen pain EXAM: ABDOMEN - 1 VIEW COMPARISON:  CT 10/14/2022 FINDINGS: Nonobstructed gas pattern with moderate stool in the colon. No radiopaque calculi. Phleboliths in the left pelvis. IMPRESSION: Nonobstructed gas pattern with moderate stool. Electronically Signed   By: Donavan Foil M.D.   On: 10/23/2022 19:59     LOS: 10 days   [place] [facility] Pager on [email or web address]  10/24/2022, 7:25 AM

## 2022-10-24 NOTE — Progress Notes (Signed)
Physical Therapy Treatment Patient Details Name: [name] Gilbert MRN: [id number] DOB: [birth year] Today's Date: 10/24/2022   History of Present Illness Gearldene Gilbert is a 62 y.o. woman presenting to hospital with generalized weakness. CT: new infarct in the right basal ganglia and small hyperdense focus in the pons, suspicious for a small  pontine hemorrhage. 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 PHMx:diabetes, hypertension, hyperlipidemia, prior strokes, visual impairment, CKD, medication nonadherance    PT Comments    Pt progressing towards her physical therapy goals. Continues with flat affect; able to follow 1 step commands with increased time and intermittent multimodal cueing. Session focused on static/dynamic sitting balance, promoting midline positioning, transfer training, and progressive weightbearing through R side. Pt requiring two person maximal assist for transfers. Recommend ST SNF to address deficits, maximize functional mobility and decrease caregiver burden.    Recommendations for follow up therapy are one component of a multi-disciplinary discharge planning process, led by the attending physician.  Recommendations may be updated based on patient status, additional functional criteria and insurance authorization.  Follow Up Recommendations  Skilled nursing-short term rehab (<3 hours/day) Can patient physically be transported by private vehicle: No   Assistance Recommended at Discharge Frequent or constant Supervision/Assistance  Patient can return home with the following Assist for transportation;Help with stairs or ramp for entrance;Two people to help with walking and/or transfers;Assistance with cooking/housework   Equipment Recommendations  Other (comment) (defer)    Recommendations for Other Services       Precautions / Restrictions Precautions Precautions: Fall Restrictions Weight Bearing Restrictions: No     Mobility  Bed Mobility Overal bed mobility: Needs  Assistance Bed Mobility: Rolling, Sidelying to Sit Rolling: Total assist Sidelying to sit: Total assist       General bed mobility comments: Assist for bending up bilateral knees and rolling towards right, with hand over hand guidance for locating bed rail. Assist for BLE's off edge of bed and trunk to upright    Transfers Overall transfer level: Needs assistance Equipment used: 2 person hand held assist Transfers: Sit to/from Stand, Bed to chair/wheelchair/BSC Sit to Stand: Max assist, +2 physical assistance Stand pivot transfers: Max assist, +2 physical assistance         General transfer comment: Manual assist for feet placement. MaxA + 2 to boost up from edge of bed x 2 to standing position with right knee block. Increased R knee hyperextension and flexed posture. Pivoting to chair with use of bed pad to guide hips on 3rd trial    Ambulation/Gait               General Gait Details: unable   Stairs             Wheelchair Mobility    Modified Rankin (Stroke Patients Only) Modified Rankin (Stroke Patients Only) Pre-Morbid Rankin Score: Moderately severe disability Modified Rankin: Severe disability     Balance Overall balance assessment: Needs assistance Sitting-balance support: Feet supported, Single extremity supported Sitting balance-Leahy Scale: Poor Sitting balance - Comments: Reliant on at least single UE support, left lateral lean. Able to correct with cueing with increased time, but unable to sustain   Standing balance support: Bilateral upper extremity supported, During functional activity Standing balance-Leahy Scale: Zero                              Cognition Arousal/Alertness: Awake/alert Behavior During Therapy: Flat affect Overall Cognitive Status: History of cognitive impairments - at  baseline                                 General Comments: Significant delayed processing, flat affect, poor attention         Exercises Other Exercises Other Exercises: Dynamic sitting balance edge of bed brushing teeth and washing face. Worked on establishing midline posture and right truncal rotation in addition to RUE distal weightbearing    General Comments        Pertinent Vitals/Pain Pain Assessment Pain Assessment: Faces Faces Pain Scale: No hurt    Home Living                          Prior Function            PT Goals (current goals can now be found in the care plan section) Acute Rehab PT Goals Patient Stated Goal: didn't state Potential to Achieve Goals: Fair Progress towards PT goals: Progressing toward goals    Frequency    Min 2X/week      PT Plan Current plan remains appropriate    Co-evaluation PT/OT/SLP Co-Evaluation/Treatment: Yes Reason for Co-Treatment: For patient/therapist safety;To address functional/ADL transfers PT goals addressed during session: Mobility/safety with mobility;Balance        AM-PAC PT "6 Clicks" Mobility   Outcome Measure  Help needed turning from your back to your side while in a flat bed without using bedrails?: Total Help needed moving from lying on your back to sitting on the side of a flat bed without using bedrails?: Total Help needed moving to and from a bed to a chair (including a wheelchair)?: Total Help needed standing up from a chair using your arms (e.g., wheelchair or bedside chair)?: Total Help needed to walk in hospital room?: Total Help needed climbing 3-5 steps with a railing? : Total 6 Click Score: 6    End of Session Equipment Utilized During Treatment: Gait belt Activity Tolerance: Patient tolerated treatment well Patient left: in chair;with call bell/phone within reach;with chair alarm set Nurse Communication: Mobility status PT Visit Diagnosis: Unsteadiness on feet (R26.81);Muscle weakness (generalized) (M62.81)     Time: [id number] PT Time Calculation (min) (ACUTE ONLY): 30 min  Charges:   $Therapeutic Activity: 8-22 mins                     Wyona Almas, PT, DPT Acute Rehabilitation Services Office [phone]    Deno Etienne 10/24/2022, 12:18 PM

## 2022-10-25 DIAGNOSIS — I613 Nontraumatic intracerebral hemorrhage in brain stem: Secondary | ICD-10-CM | POA: Diagnosis not present

## 2022-10-25 LAB — BASIC METABOLIC PANEL
Anion gap: 7 (ref 5–15)
BUN: 31 mg/dL — ABNORMAL HIGH (ref 8–23)
CO2: 25 mmol/L (ref 22–32)
Calcium: 8.6 mg/dL — ABNORMAL LOW (ref 8.9–10.3)
Chloride: 104 mmol/L (ref 98–111)
Creatinine, Ser: 1.83 mg/dL — ABNORMAL HIGH (ref 0.44–1.00)
GFR, Estimated: 31 mL/min — ABNORMAL LOW (ref >60)
Glucose, Bld: 198 mg/dL — ABNORMAL HIGH (ref 70–99)
Potassium: 4 mmol/L (ref 3.5–5.1)
Sodium: 136 mmol/L (ref 135–145)

## 2022-10-25 LAB — CBC
HCT: 37.4 % (ref 36.0–46.0)
Hemoglobin: 12.1 g/dL (ref 12.0–15.0)
MCH: 28.7 pg (ref 26.0–34.0)
MCHC: 32.4 g/dL (ref 30.0–36.0)
MCV: 88.6 fL (ref 80.0–100.0)
Platelets: 243 10*3/uL (ref 150–400)
RBC: 4.22 MIL/uL (ref 3.87–5.11)
RDW: 15.3 % (ref 11.5–15.5)
WBC: 6.5 10*3/uL (ref 4.0–10.5)
nRBC: 0 % (ref 0.0–0.2)

## 2022-10-25 LAB — GLUCOSE, CAPILLARY
Glucose-Capillary: 361 mg/dL — ABNORMAL HIGH (ref 70–99)
Glucose-Capillary: 138 mg/dL — ABNORMAL HIGH (ref 70–99)
Glucose-Capillary: 66 mg/dL — ABNORMAL LOW (ref 70–99)

## 2022-10-25 MED ORDER — PANTOPRAZOLE SODIUM 40 MG PO TBEC: 40.0000 mg | DELAYED_RELEASE_TABLET | Freq: Every day | ORAL | Status: DC

## 2022-10-25 MED ORDER — INSULIN GLARGINE-YFGN 100 UNIT/ML ~~LOC~~ SOLN
5.0000 [IU] | Freq: Every day | SUBCUTANEOUS | Status: DC
  Administered 2022-10-25: 5 [IU] via SUBCUTANEOUS
  Filled 2022-10-25: qty 0.05

## 2022-10-25 MED ORDER — ASPIRIN 81 MG PO TBEC: 81.0000 mg | DELAYED_RELEASE_TABLET | Freq: Every day | ORAL | 12 refills | Status: DC

## 2022-10-25 MED ORDER — ROSUVASTATIN CALCIUM 40 MG PO TABS: 40.0000 mg | ORAL_TABLET | Freq: Every day | ORAL | Status: DC

## 2022-10-25 MED ORDER — ACETAMINOPHEN 325 MG PO TABS: 650.0000 mg | ORAL_TABLET | ORAL | Status: DC | PRN

## 2022-10-25 MED ORDER — INSULIN GLARGINE 100 UNIT/ML SOLOSTAR PEN: 5.0000 [IU] | PEN_INJECTOR | Freq: Every day | SUBCUTANEOUS | 11 refills | Status: DC

## 2022-10-25 NOTE — Inpatient Diabetes Management (Addendum)
Inpatient Diabetes Program Recommendations  AACE/ADA: New Consensus Statement on Inpatient Glycemic Control (2015)  Target Ranges:  Prepandial:   less than 140 mg/dL      Peak postprandial:   less than 180 mg/dL (1-2 hours)      Critically ill patients:  140 - 180 mg/dL   Lab Results  Component Value Date   GLUCAP 361 (H) 10/25/2022   HGBA1C 7.8 (H) 10/15/2022    Review of Glycemic Control  Latest Reference Range & Units 10/24/22 06:09 10/24/22 09:11 10/24/22 11:18 10/24/22 16:40 10/24/22 21:21 10/25/22 06:03  Glucose-Capillary 70 - 99 mg/dL 185 (H) 153 (H) 177 (H) 258 (H) 284 (H) 361 (H)   Diabetes history: DM 2 Outpatient Diabetes medications: basal insulin in the past Current orders for Inpatient glycemic control:  Novolog 0-9 units tid Semglee 5 units Daily  A1c 7.8% on 2/4  Glucose trends increase after PO intake, however, PO intake variable.  Inpatient Diabetes Program Recommendations:    -  Add Novolog hs scale - Increase Semglee to 7 units  Thanks,  Tama Headings RN, MSN, BC-ADM Inpatient Diabetes Coordinator Team Pager [phone] (8a-5p)

## 2022-10-25 NOTE — Discharge Summary (Signed)
[facility]  Physician Discharge Summary   Patient ID: [name] Gilbert MRN: [id number] DOB/AGE: 11-26-1960 62 y.o.  Admit date: 10/14/2022 Discharge date:   10/25/2022   PCP: Christella Scheuermann, MD  DISCHARGE DIAGNOSES:  Acute CVA hemorrhagic and ischemic Essential hypertension Hyperlipidemia Seizure disorder Diabetes mellitus type 2, uncontrolled with hyperglycemia Chronic kidney disease stage IV   RECOMMENDATIONS FOR OUTPATIENT FOLLOW UP: Please check CBC and basic metabolic panel in 1 week Monitor CBGs and adjust insulin dose as indicated Ambulatory referral sent to neurology for outpatient follow-up, Dr. Leonie Man   Home Health: SNF Equipment/Devices: None  CODE STATUS: DNR  DISCHARGE CONDITION: fair  Diet recommendation: Regular as tolerated  INITIAL HISTORY:  62 y.o. woman with a past medical history significant for diabetes, hypertension, hyperlipidemia, prior strokes, visual impairment, CKD transferred from [facility] on 2/4 after presenting with 24 hours of diffuse increasing weakness and lethargy, EMS called by her husband.  Per husband patient is not compliant with blood pressure medications as well as antiplatelet agents, aspirin and Plavix, at home.  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     Consultants: Neurology   Procedures: Echocardiogram.  Carotid Dopplers   HOSPITAL COURSE:   Acute CVA hemorrhagic and ischemic  Repeat CT head stable hemorrhage noted MRI also showed acute infarcts. Echocardiogram shows normal systolic function LDL elevated - statin allergy listed with myopathy but has been prescribed rosuvastatin prior to admission.  Per husband she has been on markedly noncompliant with all her medications -would recommend to continue this medication moving forward HbA1c 7.8. Seen by PT and OT.  SNF is recommended for short-term  rehab. Neurology recommending 81 mg aspirin daily   Profoundly noncompliant Patient continues to refuse medications on occasion - husband concerned patient will not continue medications at home despite stroke - we discussed we cannot force her to take medications/treatment if she refuses.   Essential hypertension Noted to be on amlodipine, spironolactone and lisinopril prior to admission.   Patient was on Cleviprex in the ICU. Stopped spironolactone and lisinopril due to rise in creatinine.  Continue just amlodipine for now.  Improvement in blood pressure noted.  Continue to monitor.   Hyperlipidemia LDL levels 241.  Patient has not been compliant with her home medications.  Resume rosuvastatin.   Poor oral intake Oral intake has improved in the last 24 to 48 hours.  Will need encouragement.     History of seizure disorder Noted to be on Keppra prior to admission which is being continued.   Diabetes mellitus, uncontrolled with hyperglycemia Continue with glargine.  HbA1c 7.8.  Monitor CBGs and adjust insulin dose depending on CBG levels.   Chronic kidney disease stage IV Baseline creatinine is between 2.2-2.6.   Stable and at baseline.  Monitor urine output.  Avoid nephrotoxic agents.  Patient is stable.  Okay for discharge to SNF.  PERTINENT LABS:  The results of significant diagnostics from this hospitalization (including imaging, microbiology, ancillary and laboratory) are listed below for reference.     Labs:   Basic Metabolic Panel: Recent Labs  Lab 10/25/22 0720  NA 136  K 4.0  CL 104  CO2 25  GLUCOSE 198*  BUN 31*  CREATININE 1.83*  CALCIUM 8.6*    CBC: Recent Labs  Lab 10/25/22 0720  WBC 6.5  HGB 12.1  HCT 37.4  MCV 88.6  PLT 243  BNP: BNP (last 3 results) Recent Labs    01/25/22 0611 10/14/22 1429  BNP 80.8 28.2     CBG: Recent Labs  Lab 10/24/22 0911 10/24/22 1118 10/24/22 1640 10/24/22 2121 10/25/22 0603  GLUCAP 153* 177*  258* 284* 361*     IMAGING STUDIES DG Abd 1 View  Result Date: 10/23/2022 CLINICAL DATA:  Abdomen pain EXAM: ABDOMEN - 1 VIEW COMPARISON:  CT 10/14/2022 FINDINGS: Nonobstructed gas pattern with moderate stool in the colon. No radiopaque calculi. Phleboliths in the left pelvis. IMPRESSION: Nonobstructed gas pattern with moderate stool. Electronically Signed   By: Donavan Foil M.D.   On: 10/23/2022 19:59   VAS US CAROTID  Result Date: 10/23/2022 Carotid Arterial Duplex Study Patient Name:  TAJI OBROCHTA  Date of Exam:   10/16/2022 Medical Rec #: [id number]         Accession #:    [id number] Date of Birth: June 26, 1961         Patient Gender: F Patient Age:   62 years Exam Location:  [facility] Procedure:      VAS US CAROTID Referring Phys: Lovey Newcomer --------------------------------------------------------------------------------  Indications:       CVA. Risk Factors:      Hypertension, hyperlipidemia, Diabetes, prior CVA. Other Factors:     Hypertensive emergency (240/120) secondary to medication                    noncompliance. Comparison Study:  No prior study Performing Technologist: Sharion Dove RVS  Examination Guidelines: A complete evaluation includes B-mode imaging, spectral Doppler, color Doppler, and power Doppler as needed of all accessible portions of each vessel. Bilateral testing is considered an integral part of a complete examination. Limited examinations for reoccurring indications may be performed as noted.  Right Carotid Findings: +----------+--------+--------+--------+------------------+------------------+           PSV cm/sEDV cm/sStenosisPlaque DescriptionComments           +----------+--------+--------+--------+------------------+------------------+ CCA Prox  90      9                                 intimal thickening +----------+--------+--------+--------+------------------+------------------+ CCA Distal75      19                                 intimal thickening +----------+--------+--------+--------+------------------+------------------+ ICA Prox  40      11              heterogenous                         +----------+--------+--------+--------+------------------+------------------+ ICA Mid   85      20                                                   +----------+--------+--------+--------+------------------+------------------+ ICA Distal56      14                                                   +----------+--------+--------+--------+------------------+------------------+ ECA  76      9                                                    +----------+--------+--------+--------+------------------+------------------+ +----------+--------+-------+--------+-------------------+           PSV cm/sEDV cmsDescribeArm Pressure (mmHG) +----------+--------+-------+--------+-------------------+ [id number]                                         +----------+--------+-------+--------+-------------------+ +---------+--------+--+--------+-+ VertebralPSV cm/s37EDV cm/s7 +---------+--------+--+--------+-+  Left Carotid Findings: +----------+--------+--------+--------+------------------+------------------+           PSV cm/sEDV cm/sStenosisPlaque DescriptionComments           +----------+--------+--------+--------+------------------+------------------+ CCA Prox  104     10                                intimal thickening +----------+--------+--------+--------+------------------+------------------+ CCA Distal72      13                                intimal thickening +----------+--------+--------+--------+------------------+------------------+ ICA Prox  52      11              heterogenous                         +----------+--------+--------+--------+------------------+------------------+ ICA Mid   48      12                                                    +----------+--------+--------+--------+------------------+------------------+ ICA Distal62      19                                                   +----------+--------+--------+--------+------------------+------------------+ ECA       111     7                                                    +----------+--------+--------+--------+------------------+------------------+ +----------+--------+--------+--------+-------------------+           PSV cm/sEDV cm/sDescribeArm Pressure (mmHG) +----------+--------+--------+--------+-------------------+ [id number]                                          +----------+--------+--------+--------+-------------------+ +---------+--------+--+--------+-+ VertebralPSV cm/s30EDV cm/s5 +---------+--------+--+--------+-+   Summary: Right Carotid: The extracranial vessels were near-normal with only minimal wall                thickening or plaque. Left Carotid: The extracranial vessels were near-normal with only minimal wall               thickening or plaque. Vertebrals:  Bilateral vertebral arteries demonstrate antegrade flow. Subclavians: Normal flow hemodynamics were seen in bilateral subclavian              arteries. *See table(s) above for measurements and observations.  Electronically signed by Antony Contras MD on 10/23/2022 at 11:12:45 AM.    Final    CT HEAD WO CONTRAST (5MM)  Result Date: 10/16/2022 CLINICAL DATA:  Stroke, follow-up. EXAM: CT HEAD WITHOUT CONTRAST TECHNIQUE: Contiguous axial images were obtained from the base of the skull through the vertex without intravenous contrast. RADIATION DOSE REDUCTION: This exam was performed according to the departmental dose-optimization program which includes automated exposure control, adjustment of the mA and/or kV according to patient size and/or use of iterative reconstruction technique. COMPARISON:  Head CT October 14, 2022; MRI brain October 15, 2022. FINDINGS: Brain: Again seen are areas of  hypodensity in the right basal ganglia, the right frontal and left periventricular white matter as well as posterior limb of the right internal capsule, better seen on prior MRI. There is no evidence of hemorrhagic transformation or significant mass effect. The large remote left PCA territory infarct involving the left thalamus, insula and left temporal occipital region. Small remote right occipital lobe infarct. Confluent hypodensity of the white matter of the cerebral hemispheres, suggestive of chronic microangiopathy. Infratentorially, focus of hyperdensity is again seen in the pons, slightly less evident than on prior CT. No new large acute territory infarct, new focus of hemorrhage, hydrocephalus, extra-axial collection or mass lesion. Vascular: Calcified plaques in the bilateral carotid siphons and intracranial vertebral arteries. Skull: Normal. Negative for fracture or focal lesion. Sinuses/Orbits: Minimal amount of fluid in the right maxillary sinus. The orbits are maintained. Other: None. IMPRESSION: 1. Evolving infarcts in the right basal ganglia, right frontal and left periventricular white matter as well as posterior limb of the right internal capsule, better seen on prior MRI. No evidence of hemorrhagic transformation or significant mass effect. 2. Small pontine hemorrhage, slightly less evident than on prior CT. 3. No new large acute territory infarct or new focus of hemorrhage. 4. Remote left PCA territory infarct and small remote right occipital lobe infarct. 5. Chronic microangiopathy. Electronically Signed   By: Pedro Earls M.D.   On: 10/16/2022 13:35   MR BRAIN W WO CONTRAST  Result Date: 10/15/2022 CLINICAL DATA:  Stroke follow-up EXAM: MRI HEAD WITHOUT AND WITH CONTRAST MRA HEAD WITHOUT CONTRAST TECHNIQUE: Multiplanar, multi-echo pulse sequences of the brain and surrounding structures were acquired without and with intravenous contrast. Angiographic images of the Circle of  [place] were acquired using MRA technique without intravenous contrast. CONTRAST:  57m GADAVIST GADOBUTROL 1 MMOL/ML IV SOLN COMPARISON:  CT Head 10/14/22 FINDINGS: MRI HEAD FINDINGS Brain: There are acute infarcts in the right basal ganglia, right frontal lobe, the posterior limb of the internal capsule on the right. There is also an acute infarct in the left frontal lobe adjacent to the left frontal horn (series 2, image 30). Redemonstrated is a small focus of hemorrhage in the central pons (series 8, image 36). No other sites of hemorrhage are visualized. There is a chronic left occipital lobe infarct. Compared prior exam there are new regions of increased intrinsic T1 signal abnormality in the left temporal lobe, favored to represent cortical laminar necrosis. There is no evidence of an underlying contrast-enhancing lesion. Vascular: There is asymmetrically decreased arborization in the left MCA territory, in keeping with patient's history of left MCA territory infarct. Skull and upper cervical spine: Normal  marrow signal. Sinuses/Orbits: No acute or significant finding. Other: None. MRA HEAD FINDINGS Anterior circulation: No LVO. No aneurysm. There is likely at least moderate stenosis in the cavernous segment of the left ICA (series 4, image 40). There are likely multifocal regions of high-grade stenosis in the proximal M2 segments of the left MCA. Likely moderate to severe stenosis in the distal A2 segment of the left ACA. Posterior circulation: Left PCA is occluded at the P1 P2 junction. Right PCA is normal in appearance. Anatomic variants: None IMPRESSION: 1. Acute infarcts in the right basal ganglia, right frontal lobe, and posterior limb of the internal capsule on the right. 2. Acute infarct in the left frontal lobe adjacent to the left frontal horn. 3. Redemonstrated focus of hemorrhage in the central pons, likely unchagned from prior when accounting for differences in technique. 4. New regions of increased  intrinsic T1 signal abnormality in the left temporal lobe, favored to represent cortical laminar necrosis. No evidence of an underlying contrast-enhancing lesion. 5. Occlusion of the left PCA at the P1/P2 junction. 6. 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 7. Possible moderate to severe stenosis in the distal A2 segment of the left ACA. 8. Multifocal regions of high-grade stenosis in the proximal M2 segments of the left MCA. These results will be called to the ordering clinician or representative by the Radiologist Assistant, and communication documented in the PACS or [employer]. Electronically Signed   By: Marin Roberts M.D.   On: 10/15/2022 13:51   MR ANGIO HEAD WO CONTRAST  Result Date: 10/15/2022 CLINICAL DATA:  Stroke follow-up EXAM: MRI HEAD WITHOUT AND WITH CONTRAST MRA HEAD WITHOUT CONTRAST TECHNIQUE: Multiplanar, multi-echo pulse sequences of the brain and surrounding structures were acquired without and with intravenous contrast. Angiographic images of the Circle of Willis were acquired using MRA technique without intravenous contrast. CONTRAST:  65m GADAVIST GADOBUTROL 1 MMOL/ML IV SOLN COMPARISON:  CT Head 10/14/22 FINDINGS: MRI HEAD FINDINGS Brain: There are acute infarcts in the right basal ganglia, right frontal lobe, the posterior limb of the internal capsule on the right. There is also an acute infarct in the left frontal lobe adjacent to the left frontal horn (series 2, image 30). Redemonstrated is a small focus of hemorrhage in the central pons (series 8, image 36). No other sites of hemorrhage are visualized. There is a chronic left occipital lobe infarct. Compared prior exam there are new regions of increased intrinsic T1 signal abnormality in the left temporal lobe, favored to represent cortical laminar necrosis. There is no evidence of an underlying contrast-enhancing lesion. Vascular: There is asymmetrically decreased arborization in the left MCA  territory, in keeping with patient's history of left MCA territory infarct. Skull and upper cervical spine: Normal marrow signal. Sinuses/Orbits: No acute or significant finding. Other: None. MRA HEAD FINDINGS Anterior circulation: No LVO. No aneurysm. There is likely at least moderate stenosis in the cavernous segment of the left ICA (series 4, image 40). There are likely multifocal regions of high-grade stenosis in the proximal M2 segments of the left MCA. Likely moderate to severe stenosis in the distal A2 segment of the left ACA. Posterior circulation: Left PCA is occluded at the P1 P2 junction. Right PCA is normal in appearance. Anatomic variants: None IMPRESSION: 1. Acute infarcts in the right basal ganglia, right frontal lobe, and posterior limb of the internal capsule on the right. 2. Acute infarct in the left frontal lobe adjacent to the left frontal  horn. 3. Redemonstrated focus of hemorrhage in the central pons, likely unchagned from prior when accounting for differences in technique. 4. 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 No evidence of an underlying contrast-enhancing lesion. 5. Occlusion of the left PCA at the P1/P2 junction. 6. 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 7. Possible moderate to severe stenosis in the distal A2 segment of the left ACA. 8. Multifocal regions of high-grade stenosis in the proximal M2 segments of the left MCA. These results will be called to the ordering clinician or representative by the Radiologist Assistant, and communication documented in the PACS or [employer]. Electronically Signed   By: Marin Roberts M.D.   On: 10/15/2022 13:51   ECHOCARDIOGRAM COMPLETE  Result Date: 10/15/2022    ECHOCARDIOGRAM REPORT   Patient Name:   DAVINEE GRENNAN Date of Exam: 10/15/2022 Medical Rec #:  [id number]        Height:       62.0 in Accession #:     [id number]       Weight:       156.3 lb Date of Birth:  November 18, 1960        BSA:          1.722 m Patient Age:    85 years         BP:           145/70 mmHg Patient Gender: F                HR:           75 bpm. Exam Location:  Inpatient Procedure: 2D Echo, Cardiac Doppler and Color Doppler Indications:    I63.9 Stroke  History:        Patient has prior history of Echocardiogram examinations, most                 recent 01/25/2022. Stroke; Risk Factors:Hypertension, Diabetes,                 Dyslipidemia and Non-Smoker.  Sonographer:    Wilkie Aye RVT RCS Referring Phys: [id number] Lorenza Chick  Sonographer Comments: Technically challenging study due to limited acoustic windows, Technically difficult study due to poor echo windows, suboptimal apical window and suboptimal subcostal window. Image acquisition challenging due to uncooperative patient. IMPRESSIONS  1. Left ventricular ejection fraction, by estimation, is 60 to 65%. The left ventricle has normal function. The left ventricle has no regional wall motion abnormalities. Left ventricular diastolic parameters are consistent with Grade I diastolic dysfunction (impaired relaxation).  2. Right ventricular systolic function is normal. The right ventricular size is not well visualized. Tricuspid regurgitation signal is inadequate for assessing PA pressure.  3. No evidence of mitral valve regurgitation.  4. The aortic valve is tricuspid. Aortic valve regurgitation is not visualized.  5. The inferior vena cava is normal in size with greater than 50% respiratory variability, suggesting right atrial pressure of 3 mmHg. FINDINGS  Left Ventricle: Left ventricular ejection fraction, by estimation, is 60 to 65%. The left ventricle has normal function. The left ventricle has no regional wall motion abnormalities. The left ventricular internal cavity size was normal in size. There is  no left ventricular hypertrophy. Left ventricular diastolic parameters are consistent with  Grade I diastolic dysfunction (impaired relaxation). Right Ventricle: The right ventricular size is not well visualized. Right ventricular systolic function is normal. Tricuspid  regurgitation signal is inadequate for assessing PA pressure. Left Atrium: Left atrial size was normal in size. Right Atrium: Right atrial size was normal in size. Pericardium: There is no evidence of pericardial effusion. Mitral Valve: No evidence of mitral valve regurgitation. Tricuspid Valve: Tricuspid valve regurgitation is not demonstrated. Aortic Valve: The aortic valve is tricuspid. Aortic valve regurgitation is not visualized. Aortic valve mean gradient measures 3.0 mmHg. Aortic valve peak gradient measures 4.1 mmHg. Aortic valve area, by VTI measures 1.89 cm. Pulmonic Valve: Pulmonic valve regurgitation is not visualized. Venous: The inferior vena cava is normal in size with greater than 50% respiratory variability, suggesting right atrial pressure of 3 mmHg. IAS/Shunts: The interatrial septum was not well visualized.  LEFT VENTRICLE PLAX 2D LVIDd:         3.50 cm   Diastology LVIDs:         2.40 cm   LV e' medial:    4.74 cm/s LV PW:         0.80 cm   LV E/e' medial:  12.6 LV IVS:        0.90 cm   LV e' lateral:   5.42 cm/s LVOT diam:     1.60 cm   LV E/e' lateral: 11.0 LV SV:         37 LV SV Index:   22 LVOT Area:     2.01 cm  RIGHT VENTRICLE RV S prime:     5.72 cm/s LEFT ATRIUM             Index        RIGHT ATRIUM          Index LA diam:        2.80 cm 1.63 cm/m   RA Area:     8.15 cm LA Vol (A2C):   37.6 ml 21.84 ml/m  RA Volume:   15.30 ml 8.89 ml/m LA Vol (A4C):   33.1 ml 19.23 ml/m LA Biplane Vol: 36.4 ml 21.14 ml/m  AORTIC VALVE                    PULMONIC VALVE AV Area (Vmax):    1.64 cm     PV Vmax:       0.85 m/s AV Area (Vmean):   1.54 cm     PV Peak grad:  2.9 mmHg AV Area (VTI):     1.89 cm AV Vmax:           101.00 cm/s AV Vmean:          76.200 cm/s AV VTI:            0.197 m AV Peak Grad:      4.1 mmHg  AV Mean Grad:      3.0 mmHg LVOT Vmax:         82.50 cm/s LVOT Vmean:        58.500 cm/s LVOT VTI:          0.185 m LVOT/AV VTI ratio: 0.94  AORTA Ao Root diam: 2.40 cm Ao Asc diam:  2.10 cm MITRAL VALVE MV Area (PHT): 3.23 cm    SHUNTS MV Decel Time: 235 msec    Systemic VTI:  0.18 m MV E velocity: 59.70 cm/s  Systemic Diam: 1.60 cm MV A velocity: 98.10 cm/s MV E/A ratio:  0.61 Mary Scientist, physiological signed by Phineas Inches Signature Date/Time: 10/15/2022/1:26:56 PM    Final    CT HEAD WO CONTRAST (5MM)  Result Date: 10/14/2022 CLINICAL DATA:  Pontine hemorrhage EXAM: CT HEAD WITHOUT CONTRAST TECHNIQUE: Contiguous axial images were obtained from the base of the skull through the vertex without intravenous contrast. RADIATION DOSE REDUCTION: This exam was performed according to the departmental dose-optimization program which includes automated exposure control, adjustment of the mA and/or kV according to patient size and/or use of iterative reconstruction technique. COMPARISON:  Head CT 10/14/2022 at 1:58 p.m. 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 Subcentimeter focus of hemorrhage in the pons is unchanged. There is periventricular hypoattenuation compatible with chronic microvascular disease. No extra-axial collection. Vascular: No hyperdense vessel or unexpected calcification. Skull: Normal. Negative for fracture or focal lesion. Sinuses/Orbits: No acute finding. Other: None. IMPRESSION: 1. Unchanged subcentimeter focus of hemorrhage in the pons. 2. 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 Electronically Signed   By: Ulyses Jarred M.D.   On: 10/14/2022 22:29   CT ABDOMEN PELVIS WO CONTRAST  Result Date: 10/14/2022 CLINICAL DATA:  Abdominal pain EXAM: CT ABDOMEN AND PELVIS WITHOUT CONTRAST TECHNIQUE: Multidetector CT imaging of the abdomen and pelvis was performed following the standard protocol without IV contrast. RADIATION DOSE REDUCTION: This exam was performed according to the  departmental dose-optimization program which includes automated exposure control, adjustment of the mA and/or kV according to patient size and/or use of iterative reconstruction technique. COMPARISON:  06/12/2021 FINDINGS: Lower chest: No acute abnormality. Hepatobiliary: No solid liver abnormality is seen. No gallstones, gallbladder wall thickening, or biliary dilatation. Pancreas: Unremarkable. No pancreatic ductal dilatation or surrounding inflammatory changes. Spleen: Normal in size without significant abnormality. Adrenals/Urinary Tract: Adrenal glands are unremarkable. Kidneys are normal, without renal calculi, solid lesion, or hydronephrosis. Bladder is unremarkable. Stomach/Bowel: Stomach is within normal limits. Appendix appears normal. No evidence of bowel wall thickening, distention, or inflammatory changes. Occasional descending and sigmoid diverticula. Stool balls in the rectum. Vascular/Lymphatic: Scattered aortic atherosclerosis. No enlarged abdominal or pelvic lymph nodes. Reproductive: Status post hysterectomy. Other: No abdominal wall hernia or abnormality. No ascites. Musculoskeletal: No acute or significant osseous findings. IMPRESSION: 1. No acute noncontrast CT findings of the abdomen or pelvis to explain abdominal pain. 2. Occasional descending and sigmoid diverticula without evidence of acute diverticulitis. 3. Status post hysterectomy. Aortic Atherosclerosis (ICD10-I70.0). Electronically Signed   By: Delanna Ahmadi M.D.   On: 10/14/2022 14:20   CT HEAD WO CONTRAST (5MM)  Result Date: 10/14/2022 CLINICAL DATA:  Delirium EXAM: CT HEAD WITHOUT CONTRAST TECHNIQUE: Contiguous axial images were obtained from the base of the skull through the vertex without intravenous contrast. RADIATION DOSE REDUCTION: This exam was performed according to the departmental dose-optimization program which includes automated exposure control, adjustment of the mA and/or kV according to patient size and/or use of  iterative reconstruction technique. 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 There is also an infarct involving the right parietal lobe, which is also unchanged. There is sequela of severe chronic microvascular ischemic change. Compared to prior exam there is new infarct in the right basal ganglia (series 2, image 15). There is also a small hyperdense focus in the pons (series 2, image 8) represent a small pontine hemorrhage. No hydrocephalus. No extra-axial fluid collection. Vascular: The tip of the basilar and the right MCA are slightly hyperdense, possibly artifactual. Skull: Normal. Negative for fracture or focal lesion. Sinuses/Orbits: No mastoid or ear effusion. Mild mucosal thickening right maxillary sinus. Bilateral orbits are unremarkable. Frothy secretions  in the posterior ethmoid air cells on the right, could be seen in the setting acute sinusitis. Other: None. IMPRESSION: 1. 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 2. Small hyperdense focus in the pons, suspicious for a small pontine hemorrhage. 3. The tip of the basilar and the right MCA are slightly hyperdense, possibly artifactual. 4. Frothy secretions in the posterior ethmoid air cells on the right, could be seen in the setting of acute sinusitis. Electronically Signed   By: Marin Roberts M.D.   On: 10/14/2022 14:13   DG Chest Portable 1 View  Result Date: 10/14/2022 CLINICAL DATA:  Weakness. EXAM: PORTABLE CHEST 1 VIEW COMPARISON:  01/25/2022 FINDINGS: The lungs are clear without focal pneumonia, edema, pneumothorax or pleural effusion. The cardiopericardial silhouette is within normal limits for size. The visualized bony structures of the thorax are unremarkable. Telemetry leads overlie the chest. IMPRESSION: No active disease. Electronically Signed   By: Misty Stanley M.D.   On: 10/14/2022 13:38    DISCHARGE  EXAMINATION: Vitals:   10/24/22 2349 10/25/22 0332 10/25/22 0500 10/25/22 0740  BP: 125/65 (!) 141/68  134/63  Pulse: 60 (!) 57  62  Resp: 17 16  16  $ Temp: 97.6 F (36.4 C) 97.6 F (36.4 C)  97.8 F (36.6 C)  TempSrc: Oral Oral  Oral  SpO2: 100% 100%  100%  Weight:   73.4 kg   Height:       General appearance: Awake alert.  In no distress Resp: Clear to auscultation bilaterally.  Normal effort Cardio: S1-S2 is normal regular.  No S3-S4.  No rubs murmurs or bruit GI: Abdomen is soft.  Nontender nondistended.  Bowel sounds are present normal.  No masses organomegaly   DISPOSITION: SNF  Discharge Instructions     Ambulatory referral to Neurology   Complete by: As directed    An appointment is requested in approximately: 8 weeks   Call MD for:  difficulty breathing, headache or visual disturbances   Complete by: As directed    Call MD for:  extreme fatigue   Complete by: As directed    Call MD for:  persistant dizziness or light-headedness   Complete by: As directed    Call MD for:  persistant nausea and vomiting   Complete by: As directed    Call MD for:  severe uncontrolled pain   Complete by: As directed    Call MD for:  temperature >100.4   Complete by: As directed    Diet general   Complete by: As directed    Discharge instructions   Complete by: As directed    Please review instructions on the discharge summary.  You were cared for by a hospitalist during your hospital stay. If you have any questions about your discharge medications or the care you received while you were in the hospital after you are discharged, you can call the unit and asked to speak with the hospitalist on call if the hospitalist that took care of you is not available. Once you are discharged, your primary care physician will handle any further medical issues. Please note that NO REFILLS for any discharge medications will be authorized once you are discharged, as it is imperative that you return to  your primary care physician (or establish a relationship with a primary care physician if you do not have one) for your aftercare needs so that they can reassess your need for medications and monitor your lab values. If you do not have a primary  care physician, you can call [phone] for a physician referral.   Increase activity slowly   Complete by: As directed           Allergies as of 10/25/2022       Reactions   Metformin Other (See Comments)   Reaction: unknown   Penicillins Hives, Nausea And Vomiting, Swelling   Has patient had a PCN reaction causing immediate rash, facial/tongue/throat swelling, SOB or lightheadedness with hypotension: Yes Has patient had a PCN reaction causing severe rash involving mucus membranes or skin necrosis: No Has patient had a PCN reaction that required hospitalization Yes Has patient had a PCN reaction occurring within the last 10 years: No If all of the above answers are "NO", then may proceed with Cephalosporin use.   Codeine Other (See Comments)   Reaction: HIVES   Hydralazine    Stomach pain   Statins Other (See Comments)   Myopathy with atorva 80        Medication List     STOP taking these medications    aspirin 81 MG chewable tablet Replaced by: aspirin EC 81 MG tablet   carvedilol 12.5 MG tablet Commonly known as: COREG   clopidogrel 75 MG tablet Commonly known as: PLAVIX   docusate sodium 100 MG capsule Commonly known as: COLACE   hydrochlorothiazide 25 MG tablet Commonly known as: HYDRODIURIL   hydrochlorothiazide 50 MG tablet Commonly known as: HYDRODIURIL   insulin regular 100 units/mL injection Commonly known as: NOVOLIN R   lisinopril 20 MG tablet Commonly known as: ZESTRIL   pregabalin 25 MG capsule Commonly known as: LYRICA   spironolactone 50 MG tablet Commonly known as: ALDACTONE   Tresiba FlexTouch 100 UNIT/ML FlexTouch Pen Generic drug: insulin degludec       TAKE these medications     acetaminophen 325 MG tablet Commonly known as: TYLENOL Take 2 tablets (650 mg total) by mouth every 4 (four) hours as needed for mild pain (or temp > 37.5 C (99.5 F)).   amLODipine 10 MG tablet Commonly known as: NORVASC Take 10 mg by mouth daily.   aspirin EC 81 MG tablet Take 1 tablet (81 mg total) by mouth daily. Swallow whole. Start taking on: October 26, 2022 Replaces: aspirin 81 MG chewable tablet   B-complex with vitamin C tablet Take 1 tablet by mouth daily.   insulin glargine 100 UNIT/ML Solostar Pen Commonly known as: LANTUS Inject 5 Units into the skin daily. What changed: how much to take   levETIRAcetam 250 MG tablet Commonly known as: KEPPRA Take 250 mg by mouth 2 (two) times daily.   pantoprazole 40 MG tablet Commonly known as: PROTONIX Take 1 tablet (40 mg total) by mouth at bedtime.   rosuvastatin 40 MG tablet Commonly known as: CRESTOR Take 1 tablet (40 mg total) by mouth at bedtime. What changed:  medication strength how much to take Another medication with the same name was removed. Continue taking this medication, and follow the directions you see here.   Vitamin D (Ergocalciferol) 1.25 MG (50000 UNIT) Caps capsule Commonly known as: DRISDOL Take 50,000 Units by mouth every 7 (seven) days.          Follow-up Information     Christella Scheuermann, MD Follow up.   Specialty: Internal Medicine Why: post hospitalization follow up Contact information: [place] [address] [phone]                 TOTAL DISCHARGE TIME: 46  minutes  [place] [employer] on [email or web address]  10/25/2022, 10:23 AM

## 2022-10-25 NOTE — TOC Progression Note (Signed)
Transition of Care [facility]) - Progression Note    Patient Details  Name: [name] Gilbert MRN: [id number] Date of Birth: 01/10/1961  Transition of Care [facility]) CM/SW [place], [place] Phone Number: 10/25/2022, 12:16 PM  Clinical Narrative:   CSW needing updated therapy notes for insurance approval. CSW coordinated with therapy team for updates, sent to insurance for approval. Awaiting authorization for SNF at this time.    Expected Discharge Plan: Skilled Nursing Facility Barriers to Discharge: Continued Medical Work up, Ship broker  Expected Discharge Plan and Services     Post Acute Care Choice: [place] arrangements for the past 2 months: Single Family Home Expected Discharge Date: 10/25/22                                     Social Determinants of Health (SDOH) Interventions SDOH Screenings   Food Insecurity: No Food Insecurity (10/23/2022)  Housing: Low Risk  (10/23/2022)  Transportation Needs: No Transportation Needs (10/23/2022)  Utilities: Not At Risk (10/23/2022)  Tobacco Use: Low Risk  (10/23/2022)    Readmission Risk Interventions     No data to display

## 2022-10-25 NOTE — Plan of Care (Signed)
Report called to receiving RN at [facility] rehab. Problem: Education: Goal: Knowledge of disease or condition will improve Outcome: Adequate for Discharge Goal: Knowledge of secondary prevention will improve (MUST DOCUMENT ALL) Outcome: Adequate for Discharge Goal: Knowledge of patient specific risk factors will improve [place] N/A or DELETE if not current risk factor) Outcome: Adequate for Discharge   Problem: Intracerebral Hemorrhage Tissue Perfusion: Goal: Complications of Intracerebral Hemorrhage will be minimized Outcome: Adequate for Discharge   Problem: Coping: Goal: Will verbalize positive feelings about self Outcome: Adequate for Discharge Goal: Will identify appropriate support needs Outcome: Adequate for Discharge   Problem: Health Behavior/Discharge Planning: Goal: Ability to manage health-related needs will improve Outcome: Adequate for Discharge Goal: Goals will be collaboratively established with patient/family Outcome: Adequate for Discharge   Problem: Self-Care: Goal: Ability to participate in self-care as condition permits will improve Outcome: Adequate for Discharge Goal: Verbalization of feelings and concerns over difficulty with self-care will improve Outcome: Adequate for Discharge Goal: Ability to communicate needs accurately will improve Outcome: Adequate for Discharge   Problem: Nutrition: Goal: Risk of aspiration will decrease Outcome: Adequate for Discharge Goal: Dietary intake will improve Outcome: Adequate for Discharge   Problem: Education: Goal: Ability to describe self-care measures that may prevent or decrease complications (Diabetes Survival Skills Education) will improve Outcome: Adequate for Discharge Goal: Individualized Educational Video(s) Outcome: Adequate for Discharge   Problem: Coping: Goal: Ability to adjust to condition or change in health will improve Outcome: Adequate for Discharge   Problem: Fluid Volume: Goal: Ability  to maintain a balanced intake and output will improve Outcome: Adequate for Discharge   Problem: Health Behavior/Discharge Planning: Goal: Ability to identify and utilize available resources and services will improve Outcome: Adequate for Discharge Goal: Ability to manage health-related needs will improve Outcome: Adequate for Discharge   Problem: Metabolic: Goal: Ability to maintain appropriate glucose levels will improve Outcome: Adequate for Discharge   Problem: Nutritional: Goal: Maintenance of adequate nutrition will improve Outcome: Adequate for Discharge Goal: Progress toward achieving an optimal weight will improve Outcome: Adequate for Discharge   Problem: Skin Integrity: Goal: Risk for impaired skin integrity will decrease Outcome: Adequate for Discharge   Problem: Tissue Perfusion: Goal: Adequacy of tissue perfusion will improve Outcome: Adequate for Discharge   Problem: Acute Rehab PT Goals(only PT should resolve) Goal: Pt will Roll Supine to Side Outcome: Adequate for Discharge Goal: Pt Will Go Supine/Side To Sit Outcome: Adequate for Discharge Goal: Pt Will Go Sit To Supine/Side Outcome: Adequate for Discharge Goal: Patient Will Perform Sitting Balance Outcome: Adequate for Discharge Goal: Patient Will Transfer Sit To/From Stand Outcome: Adequate for Discharge Goal: Pt Will Transfer Bed To Chair/Chair To Bed Outcome: Adequate for Discharge Goal: Pt Will Ambulate Outcome: Adequate for Discharge   Problem: Acute Rehab OT Goals (only OT should resolve) Goal: Pt. Will Perform Eating Outcome: Adequate for Discharge Goal: Pt. Will Perform Grooming Outcome: Adequate for Discharge Goal: Pt. Will Transfer To Toilet Outcome: Adequate for Discharge Goal: Pt. Will Perform Toileting-Clothing Manipulation Outcome: Adequate for Discharge Goal: OT Additional ADL Goal #1 Outcome: Adequate for Discharge   Problem: SLP Cognition Goals Goal: Patient will demonstrate  attention to functional Description: Patient will demonstrate attention to functional task with Outcome: Adequate for Discharge   Problem: Education: Goal: Knowledge of General Education information will improve Description: Including pain rating scale, medication(s)/side effects and non-pharmacologic comfort measures Outcome: Adequate for Discharge   Problem: Health Behavior/Discharge Planning: Goal: Ability to manage health-related needs will  improve Outcome: Adequate for Discharge   Problem: Clinical Measurements: Goal: Ability to maintain clinical measurements within normal limits will improve Outcome: Adequate for Discharge Goal: Will remain free from infection Outcome: Adequate for Discharge Goal: Diagnostic test results will improve Outcome: Adequate for Discharge Goal: Respiratory complications will improve Outcome: Adequate for Discharge Goal: Cardiovascular complication will be avoided Outcome: Adequate for Discharge   Problem: Activity: Goal: Risk for activity intolerance will decrease Outcome: Adequate for Discharge   Problem: Nutrition: Goal: Adequate nutrition will be maintained Outcome: Adequate for Discharge   Problem: Coping: Goal: Level of anxiety will decrease Outcome: Adequate for Discharge   Problem: Elimination: Goal: Will not experience complications related to bowel motility Outcome: Adequate for Discharge Goal: Will not experience complications related to urinary retention Outcome: Adequate for Discharge   Problem: Pain Managment: Goal: General experience of comfort will improve Outcome: Adequate for Discharge   Problem: Safety: Goal: Ability to remain free from injury will improve Outcome: Adequate for Discharge   Problem: Skin Integrity: Goal: Risk for impaired skin integrity will decrease Outcome: Adequate for Discharge   Problem: SLP Dysphagia Goals Goal: Patient will utilize recommended strategies Description: Patient will utilize  recommended strategies during swallow to increase swallowing safety with Outcome: Adequate for Discharge

## 2022-10-25 NOTE — TOC Transition Note (Signed)
Transition of Care [facility]) - CM/SW Discharge Note   Patient Details  Name: [name] Gilbert MRN: [id number] Date of Birth: [birth year]  Transition of Care [facility]) CM/SW Contact:  Geralynn Ochs, LCSW Phone Number: 10/25/2022, 12:17 PM   Clinical Narrative:   CSW received insurance authorization for patient to admit to SNF today, sent discharge information. CSW updated spouse, Herbie Baltimore, who is in agreement. Transport arranged with [facility].  Nurse to call report to [phone], Room 501A    Final next level of care: Skilled Nursing Facility Barriers to Discharge: Barriers Resolved   Patient Goals and CMS Choice CMS Medicare.gov Compare Post Acute Care list provided to:: Patient Represenative (must comment) Choice offered to / list presented to : Spouse  Discharge Placement                Patient chooses bed at: [facility] Patient to be transferred to facility by: [place] Name of family member notified: Herbie Baltimore Patient and family notified of of transfer: 10/25/22  Discharge Plan and Services Additional resources added to the After Visit Summary for       Post Acute Care Choice: [place]                               Social Determinants of Health (SDOH) Interventions SDOH Screenings   Food Insecurity: No Food Insecurity (10/23/2022)  Housing: Low Risk  (10/23/2022)  Transportation Needs: No Transportation Needs (10/23/2022)  Utilities: Not At Risk (10/23/2022)  Tobacco Use: Low Risk  (10/23/2022)     Readmission Risk Interventions     No data to display

## 2022-11-21 ENCOUNTER — Emergency Department: Payer: Medicare HMO

## 2022-11-21 ENCOUNTER — Emergency Department
Admission: EM | Admit: 2022-11-21 | Discharge: 2022-11-21 | Disposition: A | Discharge status: Home / Self Care | Payer: Medicare HMO | Attending: Emergency Medicine | Admitting: Emergency Medicine

## 2022-11-21 DIAGNOSIS — Z8673 Personal history of transient ischemic attack (TIA), and cerebral infarction without residual deficits: Secondary | ICD-10-CM | POA: Insufficient documentation

## 2022-11-21 DIAGNOSIS — K5900 Constipation, unspecified: Secondary | ICD-10-CM | POA: Diagnosis not present

## 2022-11-21 DIAGNOSIS — R109 Unspecified abdominal pain: Secondary | ICD-10-CM

## 2022-11-21 DIAGNOSIS — E876 Hypokalemia: Secondary | ICD-10-CM | POA: Insufficient documentation

## 2022-11-21 DIAGNOSIS — I130 Hypertensive heart and chronic kidney disease with heart failure and stage 1 through stage 4 chronic kidney disease, or unspecified chronic kidney disease: Secondary | ICD-10-CM | POA: Insufficient documentation

## 2022-11-21 DIAGNOSIS — I5032 Chronic diastolic (congestive) heart failure: Secondary | ICD-10-CM | POA: Diagnosis not present

## 2022-11-21 DIAGNOSIS — N1832 Chronic kidney disease, stage 3b: Secondary | ICD-10-CM | POA: Insufficient documentation

## 2022-11-21 DIAGNOSIS — E119 Type 2 diabetes mellitus without complications: Secondary | ICD-10-CM | POA: Diagnosis not present

## 2022-11-21 DIAGNOSIS — N179 Acute kidney failure, unspecified: Secondary | ICD-10-CM

## 2022-11-21 LAB — COMPREHENSIVE METABOLIC PANEL
ALT: 14 U/L (ref 0–44)
AST: 23 U/L (ref 15–41)
Albumin: 3.8 g/dL (ref 3.5–5.0)
Alkaline Phosphatase: 118 U/L (ref 38–126)
Anion gap: 9 (ref 5–15)
BUN: 38 mg/dL — ABNORMAL HIGH (ref 8–23)
CO2: 26 mmol/L (ref 22–32)
Calcium: 9.2 mg/dL (ref 8.9–10.3)
Chloride: 101 mmol/L (ref 98–111)
Creatinine, Ser: 2.97 mg/dL — ABNORMAL HIGH (ref 0.44–1.00)
GFR, Estimated: 17 mL/min — ABNORMAL LOW (ref >60)
Glucose, Bld: 148 mg/dL — ABNORMAL HIGH (ref 70–99)
Potassium: 3.1 mmol/L — ABNORMAL LOW (ref 3.5–5.1)
Sodium: 136 mmol/L (ref 135–145)
Total Bilirubin: 0.8 mg/dL (ref 0.3–1.2)
Total Protein: 9.5 g/dL — ABNORMAL HIGH (ref 6.5–8.1)

## 2022-11-21 LAB — TROPONIN I (HIGH SENSITIVITY)
Troponin I (High Sensitivity): 33 ng/L — ABNORMAL HIGH (ref <18)
Troponin I (High Sensitivity): 31 ng/L — ABNORMAL HIGH (ref <18)

## 2022-11-21 LAB — URINALYSIS, ROUTINE W REFLEX MICROSCOPIC
Bacteria, UA: NONE SEEN
Bilirubin Urine: NEGATIVE
Glucose, UA: 50 mg/dL — AB
Ketones, ur: NEGATIVE mg/dL
Nitrite: NEGATIVE
Protein, ur: 100 mg/dL — AB
Specific Gravity, Urine: 1.009 (ref 1.005–1.030)
pH: 5 (ref 5.0–8.0)

## 2022-11-21 LAB — CBC WITH DIFFERENTIAL/PLATELET
Abs Immature Granulocytes: 0.02 10*3/uL (ref 0.00–0.07)
Basophils Absolute: 0 10*3/uL (ref 0.0–0.1)
Basophils Relative: 0 %
Eosinophils Absolute: 0.1 10*3/uL (ref 0.0–0.5)
Eosinophils Relative: 2 %
HCT: 40.1 % (ref 36.0–46.0)
Hemoglobin: 13.7 g/dL (ref 12.0–15.0)
Immature Granulocytes: 0 %
Lymphocytes Relative: 26 %
Lymphs Abs: 2.1 10*3/uL (ref 0.7–4.0)
MCH: 28.5 pg (ref 26.0–34.0)
MCHC: 34.2 g/dL (ref 30.0–36.0)
MCV: 83.4 fL (ref 80.0–100.0)
Monocytes Absolute: 0.6 10*3/uL (ref 0.1–1.0)
Monocytes Relative: 7 %
Neutro Abs: 5.2 10*3/uL (ref 1.7–7.7)
Neutrophils Relative %: 65 %
Platelets: 340 10*3/uL (ref 150–400)
RBC: 4.81 MIL/uL (ref 3.87–5.11)
RDW: 14.2 % (ref 11.5–15.5)
WBC: 8.1 10*3/uL (ref 4.0–10.5)
nRBC: 0 % (ref 0.0–0.2)

## 2022-11-21 LAB — LIPASE, BLOOD: Lipase: 26 U/L (ref 11–51)

## 2022-11-21 MED ORDER — POTASSIUM CHLORIDE CRYS ER 20 MEQ PO TBCR
40.0000 meq | EXTENDED_RELEASE_TABLET | Freq: Once | ORAL | Status: AC
  Administered 2022-11-21: 40 meq via ORAL
  Filled 2022-11-21: qty 2

## 2022-11-21 MED ORDER — LACTULOSE 10 GM/15ML PO SOLN
30.0000 g | Freq: Once | ORAL | Status: AC
  Administered 2022-11-21: 30 g via ORAL
  Filled 2022-11-21: qty 60

## 2022-11-21 MED ORDER — POLYETHYLENE GLYCOL 3350 17 G PO PACK: 17.0000 g | PACK | Freq: Every day | ORAL | 0 refills | Status: AC

## 2022-11-21 MED ORDER — LACTATED RINGERS IV BOLUS
1000.0000 mL | Freq: Once | INTRAVENOUS | Status: AC
  Administered 2022-11-21: 1000 mL via INTRAVENOUS

## 2022-11-21 MED ORDER — SENNA 8.6 MG PO TABS: 1.0000 | ORAL_TABLET | Freq: Every day | ORAL | 0 refills | Status: AC

## 2022-11-21 NOTE — ED Triage Notes (Signed)
EMS brings pt in from home for c/o abd pain, no BM x 7 days

## 2022-11-21 NOTE — ED Provider Notes (Signed)
[facility] Provider Note    Event Date/Time   First MD Initiated Contact with Patient 11/21/22 [phone]     (approximate)   History   Abdominal Pain   HPI  [name] Gilbert is a 62 y.o. female past medical history of diabetes, hypertension, stroke who presents with abdominal pain.  Patient has baseline aphasia so history is somewhat difficult but she does tell me that she has had abdominal pain over the last 3 days.  Points to the periumbilical region when asked about location of the pain.  Has not had vomiting nausea.  Says she has not been eating and drinking much.  Has not had a bowel movement in about a week.  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  Since leaving rehab has not had a bowel movement.  She is not on any stool softeners.  Denies pain with urination.     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[place]) 02/02/2022   Hyperkalemia 02/01/2022   Closed fracture of right distal fibula    Stroke ([place]) 01/25/2022   HLD (hyperlipidemia) 01/25/2022   Type II diabetes mellitus with renal manifestations ([place]) 01/25/2022   Stage 3b chronic kidney disease (CKD) ([place]) 01/25/2022   Chronic diastolic CHF (congestive heart failure) ([place]) 01/25/2022   Hypertensive emergency 01/25/2022   Elevated troponin 01/25/2022   Fall    Neurologic abnormality 06/13/2021   Hypoglycemia 06/13/2021   Hypothermia 06/13/2021   Dehydration 06/12/2021   Headache 06/12/2021   Abdominal pain 06/12/2021   Hypoglycemia associated with diabetes ([place]) 06/12/2021   Acute respiratory failure with hypoxia ([place])    HHNC (hyperglycemic hyperosmolar  nonketotic coma) ([place]) 10/01/2019   HTN (hypertension) 11/01/2018   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 --      Excl. in [place]? --     Most recent vital signs: Vitals:   11/21/22 0412 11/21/22 0534  BP: (!) 151/78   Pulse: 93   Resp: 14   Temp:  98.5 F (36.9 C)  SpO2: 100%      General: Awake, no distress.  CV:  Good peripheral perfusion.  Resp:  Normal effort.  Abd:  No distention.  Mild tenderness in bilateral lower quadrants but no guarding Neuro:  Awake, Alert, Oriented x 3  Other:  Stool ball on rectal exam brown stool   ED Results / Procedures / Treatments  Labs (all labs ordered are listed, but only abnormal results are displayed) Labs Reviewed  COMPREHENSIVE METABOLIC PANEL - Abnormal; Notable for the following components:      Result Value   Potassium 3.1 (*)    Glucose, Bld 148 (*)    BUN 38 (*)    Creatinine, Ser 2.97 (*)    Total Protein 9.5 (*)    GFR, Estimated 17 (*)    All other components within normal limits  TROPONIN I (HIGH SENSITIVITY) - Abnormal; Notable for the following components:   Troponin I (High Sensitivity) 33 (*)    All other components within normal limits  CBC WITH DIFFERENTIAL/PLATELET  LIPASE, BLOOD  URINALYSIS, ROUTINE W REFLEX MICROSCOPIC  TROPONIN I (HIGH SENSITIVITY)     EKG  EKG reviewed interpreted myself shows a  left posterior fascicular block right axis deviation no acute ischemic changes   RADIOLOGY I reviewed interpreted CT of the abdomen pelvis which shows constipation no acute intra-abdominal process   PROCEDURES:  Critical Care performed: No  Fecal disimpaction  Date/Time: 11/21/2022 6:11 AM  Performed by: Rada Hay, MD Authorized by: Rada Hay, MD  Consent: Verbal consent obtained. Written consent not obtained. Patient identity confirmed: verbally with patient Time out: Immediately prior to procedure a "time out" was called to verify the correct patient, procedure, equipment, support staff and site/side marked as required. Local anesthesia used: no  Anesthesia: Local anesthesia used: no  Sedation: Patient sedated: no  Patient tolerance: patient tolerated the procedure well with no immediate complications     The patient is on the cardiac monitor to evaluate for evidence of arrhythmia and/or significant heart rate changes.   MEDICATIONS ORDERED IN ED: Medications  potassium chloride SA (KLOR-CON M) CR tablet 40 mEq (has no administration in time range)  lactated ringers bolus 1,000 mL ( Intravenous Stopped 11/21/22 0519)     IMPRESSION / MDM / ASSESSMENT AND PLAN / ED COURSE  I reviewed the triage vital signs and the nursing notes.                              Patient's presentation is most consistent with acute presentation with potential threat to life or bodily function.  Differential diagnosis includes, but is not limited to, bowel obstruction, constipation, fecal impaction, AKI, electrolyte abnormality, UTI  Patient is a 62 year old female with recent CVA just left rehab about a week ago presents with constipation abdominal pain.  Has had about 3 days of diffuse abdominal pain with decreased p.o. intake.  Patient's husband notes that she is at her baseline aphasia has been baseline since her stroke.  Has not had a BM in about 7 days since leaving  rehab.  Is not on any stool softeners.  Patient does have dry she has mild tenderness throughout the abdomen but abdominal exam is benign.  Plan to obtain CT of the abdomen pelvis as well as labs.  Will give fluid bolus that she does look dehydrated.  Patient's renal function is somewhat increased from baseline baseline creatinine is about 2.6 it is 2.97 today, BUN elevated at 38.  She is also mildly hypokalemic potassium 3.1.  CBC within normal limits.  CT abdomen pelvis obtained without contrast given patient's renal insufficiency.  Does demonstrate constipation but no bowel obstruction  or other acute findings.  A troponin was ordered from triage this is mildly elevated at 33.  She is not having any active chest pain is no ischemic changes on EKG will repeat.  Has been elevated in the past as well likely in the setting of CKD.  I did perform rectal exam patient has stool ball in the rectal vault.  Perform manual disimpaction with patient tolerated.  Will give enema in the ED and p.o. lactulose to start cleanout.  Patient will need urinalysis.  Will get straight cath.  Patient signed out to oncoming provider pending repeat troponin and urinalysis.  I do think she will be appropriate to be discharged.         FINAL CLINICAL IMPRESSION(S) / ED DIAGNOSES   Final diagnoses:  Constipation, unspecified constipation type  Abdominal pain, unspecified abdominal location     Rx / DC Orders   ED Discharge Orders     None        Note:  This document was prepared using Dragon voice recognition software and may include unintentional dictation errors.   Rada Hay, MD 11/21/22 [phone]

## 2022-11-21 NOTE — ED Triage Notes (Signed)
Pt from home via [facility] with c/o lower abd pain and constipation. Pt reports pain ongoing x 2 days, states abd tender to palpation. Denies urinary sx. Last BM " a few days ago."

## 2022-11-21 NOTE — Discharge Instructions (Addendum)
You are severely constipated.  Please start taking the MiraLAX twice a day indefinitely, as well as the senna for the next week.  Once you are having regular bowel movements you can decrease the MiraLAX to once daily.  Please make sure you are drinking plenty of water and try to maintain a high-fiber diet.

## 2022-12-26 ENCOUNTER — Ambulatory Visit: Payer: Medicare HMO | Admitting: Neurology

## 2022-12-31 ENCOUNTER — Other Ambulatory Visit: Payer: Self-pay

## 2022-12-31 ENCOUNTER — Inpatient Hospital Stay
Admission: EM | Admit: 2022-12-31 | Discharge: 2023-01-04 | DRG: 065 | Disposition: A | Discharge status: Skilled Nursing Facility | Payer: Medicare HMO | Attending: Student | Admitting: Student

## 2022-12-31 ENCOUNTER — Emergency Department: Payer: Medicare HMO

## 2022-12-31 DIAGNOSIS — F0390 Unspecified dementia without behavioral disturbance: Secondary | ICD-10-CM | POA: Diagnosis present

## 2022-12-31 DIAGNOSIS — N184 Chronic kidney disease, stage 4 (severe): Secondary | ICD-10-CM | POA: Diagnosis present

## 2022-12-31 DIAGNOSIS — Z7401 Bed confinement status: Secondary | ICD-10-CM

## 2022-12-31 DIAGNOSIS — E11649 Type 2 diabetes mellitus with hypoglycemia without coma: Secondary | ICD-10-CM | POA: Diagnosis not present

## 2022-12-31 DIAGNOSIS — E639 Nutritional deficiency, unspecified: Secondary | ICD-10-CM | POA: Diagnosis present

## 2022-12-31 DIAGNOSIS — Z888 Allergy status to other drugs, medicaments and biological substances status: Secondary | ICD-10-CM

## 2022-12-31 DIAGNOSIS — Z79899 Other long term (current) drug therapy: Secondary | ICD-10-CM

## 2022-12-31 DIAGNOSIS — I6381 Other cerebral infarction due to occlusion or stenosis of small artery: Principal | ICD-10-CM | POA: Diagnosis present

## 2022-12-31 DIAGNOSIS — E876 Hypokalemia: Secondary | ICD-10-CM | POA: Diagnosis not present

## 2022-12-31 DIAGNOSIS — Z7982 Long term (current) use of aspirin: Secondary | ICD-10-CM

## 2022-12-31 DIAGNOSIS — K219 Gastro-esophageal reflux disease without esophagitis: Secondary | ICD-10-CM | POA: Insufficient documentation

## 2022-12-31 DIAGNOSIS — Z794 Long term (current) use of insulin: Secondary | ICD-10-CM

## 2022-12-31 DIAGNOSIS — I129 Hypertensive chronic kidney disease with stage 1 through stage 4 chronic kidney disease, or unspecified chronic kidney disease: Secondary | ICD-10-CM | POA: Diagnosis present

## 2022-12-31 DIAGNOSIS — G459 Transient cerebral ischemic attack, unspecified: Secondary | ICD-10-CM | POA: Diagnosis not present

## 2022-12-31 DIAGNOSIS — Z885 Allergy status to narcotic agent status: Secondary | ICD-10-CM

## 2022-12-31 DIAGNOSIS — I639 Cerebral infarction, unspecified: Secondary | ICD-10-CM

## 2022-12-31 DIAGNOSIS — R4189 Other symptoms and signs involving cognitive functions and awareness: Secondary | ICD-10-CM | POA: Diagnosis present

## 2022-12-31 DIAGNOSIS — Z87891 Personal history of nicotine dependence: Secondary | ICD-10-CM

## 2022-12-31 DIAGNOSIS — I6622 Occlusion and stenosis of left posterior cerebral artery: Secondary | ICD-10-CM | POA: Diagnosis present

## 2022-12-31 DIAGNOSIS — E872 Acidosis, unspecified: Secondary | ICD-10-CM | POA: Diagnosis not present

## 2022-12-31 DIAGNOSIS — Z880 Allergy status to penicillin: Secondary | ICD-10-CM

## 2022-12-31 DIAGNOSIS — Z833 Family history of diabetes mellitus: Secondary | ICD-10-CM

## 2022-12-31 DIAGNOSIS — I6603 Occlusion and stenosis of bilateral middle cerebral arteries: Secondary | ICD-10-CM | POA: Diagnosis present

## 2022-12-31 DIAGNOSIS — Z8673 Personal history of transient ischemic attack (TIA), and cerebral infarction without residual deficits: Secondary | ICD-10-CM

## 2022-12-31 DIAGNOSIS — E1129 Type 2 diabetes mellitus with other diabetic kidney complication: Secondary | ICD-10-CM | POA: Diagnosis present

## 2022-12-31 DIAGNOSIS — R4701 Aphasia: Secondary | ICD-10-CM | POA: Diagnosis present

## 2022-12-31 DIAGNOSIS — E785 Hyperlipidemia, unspecified: Secondary | ICD-10-CM | POA: Insufficient documentation

## 2022-12-31 DIAGNOSIS — Z66 Do not resuscitate: Secondary | ICD-10-CM | POA: Diagnosis present

## 2022-12-31 DIAGNOSIS — I10 Essential (primary) hypertension: Secondary | ICD-10-CM | POA: Insufficient documentation

## 2022-12-31 DIAGNOSIS — I6612 Occlusion and stenosis of left anterior cerebral artery: Secondary | ICD-10-CM | POA: Diagnosis present

## 2022-12-31 DIAGNOSIS — G40909 Epilepsy, unspecified, not intractable, without status epilepticus: Secondary | ICD-10-CM

## 2022-12-31 DIAGNOSIS — Z90710 Acquired absence of both cervix and uterus: Secondary | ICD-10-CM

## 2022-12-31 DIAGNOSIS — E1122 Type 2 diabetes mellitus with diabetic chronic kidney disease: Secondary | ICD-10-CM | POA: Diagnosis present

## 2022-12-31 DIAGNOSIS — R4182 Altered mental status, unspecified: Secondary | ICD-10-CM

## 2022-12-31 DIAGNOSIS — R410 Disorientation, unspecified: Principal | ICD-10-CM

## 2022-12-31 DIAGNOSIS — R29715 NIHSS score 15: Secondary | ICD-10-CM | POA: Diagnosis present

## 2022-12-31 LAB — CBG MONITORING, ED: Glucose-Capillary: 103 mg/dL — ABNORMAL HIGH (ref 70–99)

## 2022-12-31 NOTE — ED Notes (Signed)
called to carelink to activate code stroke Per MD Smith/[email or web address] KitchenMarland Kitchen

## 2022-12-31 NOTE — ED Provider Notes (Signed)
[facility] Provider Note    None    (approximate)   History   Altered Mental Status   HPI  [name] Gilbert is a 62 y.o. female who presents to the ED for evaluation of Altered Mental Status   Review of medical DC summary from 2/14.  She was admitted for an acute stroke mixed hemorrhagic and ischemic.  For the history of HTN, DM, CKD 4 and a seizure disorder.  She went to short-term rehab for a couple weeks after this and has returned home for the past 6 or 8 weeks.  Husband reports that at baseline she is conversational and interactive but does not walk.  Bilateral dysmetria and weakness.   Patient presents to the ED tonight from home via EMS for evaluation of sudden onset confusion and aphasia that began this evening around 10 PM.  Husband reports last confident normal time was around 7 PM this evening.  They were in bed watching a movie when she suddenly looked at him and asked for help to get into bed, seemed confused and was slurring her speech which is unusual.  She also did not put up a fight when he told her that he was can call 911, which is also unusual.  He reports a sudden generalized change in her mental status and speaking.   Prior to this, husband reports that she has been doing well without recent illnesses.  No remarkable events since the last stroke and hospitalization in February, per the husband.  Here in the ED, husband reports that she seems a little bit better than at home, but certainly not back to baseline or "normal."  History is limited from the patient.  She reports that she was "feeling sick" while sitting in bed watching television.  Unable to elaborate upon this.  Denies pain.  Physical Exam   Triage Vital Signs: ED Triage Vitals [12/31/22 2318]  Enc Vitals Group     BP (!) 156/92     Pulse Rate 99     Resp 18     Temp 97.6 F (36.4 C)     Temp src      SpO2 100 %     Weight      Height      Head Circumference       Peak Flow      Pain Score      Pain Loc      Pain Edu?      Excl. in [place]?     Most recent vital signs: Vitals:   12/31/22 2318 01/01/23 0000  BP: (!) 156/92 (!) 177/86  Pulse: 99 (!) 106  Resp: 18 15  Temp: 97.6 F (36.4 C)   SpO2: 100% 100%    General: Lethargic, speaking slowly and mild dysarthria. CV:  Good peripheral perfusion.  Resp:  Normal effort.  Abd:  No distention. soft MSK:  No deformity noted.  Neuro:  No noted seizure activity Other:     ED Results / Procedures / Treatments   Labs (all labs ordered are listed, but only abnormal results are displayed) Labs Reviewed  COMPREHENSIVE METABOLIC PANEL - Abnormal; Notable for the following components:      Result Value   Glucose, Bld 124 (*)    BUN 24 (*)    Creatinine, Ser 1.80 (*)    Total Protein 8.3 (*)    GFR, Estimated 31 (*)    All other components within normal limits  URINALYSIS, ROUTINE  W REFLEX MICROSCOPIC - Abnormal; Notable for the following components:   Color, Urine YELLOW (*)    APPearance HAZY (*)    Specific Gravity, Urine 1.003 (*)    Glucose, UA >=500 (*)    Hgb urine dipstick SMALL (*)    Protein, ur 100 (*)    Bacteria, UA RARE (*)    All other components within normal limits  CBG MONITORING, ED - Abnormal; Notable for the following components:   Glucose-Capillary 103 (*)    All other components within normal limits  ETHANOL  PROTIME-INR  APTT  CBC  DIFFERENTIAL  URINE DRUG SCREEN, QUALITATIVE ([facility] ONLY)    EKG Sinus rhythm with a rate of 100 bpm.  Rightward axis and normal intervals.  No clear signs of acute ischemia.  RADIOLOGY CXR interpreted by me without evidence of acute cardiopulmonary pathology. CT head interpreted by me without evidence of acute intracranial pathology  Official radiology report(s): DG Chest Portable 1 View  Result Date: 01/01/2023 CLINICAL DATA:  Altered mental status. EXAM: PORTABLE CHEST 1 VIEW COMPARISON:  October 14, 2022 FINDINGS: The  heart size and mediastinal contours are within normal limits. Both lungs are clear. The visualized skeletal structures are unremarkable. IMPRESSION: No active disease. Electronically Signed   By: Aram Candela M.D.   On: 01/01/2023 00:38   CT HEAD CODE STROKE WO CONTRAST  Result Date: 12/31/2022 CLINICAL DATA:  Code stroke.  Confusion and aphasia EXAM: CT HEAD WITHOUT CONTRAST TECHNIQUE: Contiguous axial images were obtained from the base of the skull through the vertex without intravenous contrast. RADIATION DOSE REDUCTION: This exam was performed according to the departmental dose-optimization program which includes automated exposure control, adjustment of the mA and/or kV according to patient size and/or use of iterative reconstruction technique. COMPARISON:  None Available. FINDINGS: Brain: There is no mass, hemorrhage or extra-axial collection. The size and configuration of the ventricles and extra-axial CSF spaces are normal. There is hypoattenuation of the periventricular white matter, most commonly indicating chronic ischemic microangiopathy. Large, old left PCA territory infarct. Small old right occipital infarct. Vascular: No abnormal hyperdensity of the major intracranial arteries or dural venous sinuses. No intracranial atherosclerosis. Skull: The visualized skull base, calvarium and extracranial soft tissues are normal. Sinuses/Orbits: No fluid levels or advanced mucosal thickening of the visualized paranasal sinuses. No mastoid or middle ear effusion. The orbits are normal. ASPECTS [facility] Stroke Program Early CT Score) - Ganglionic level infarction (caudate, lentiform nuclei, internal capsule, insula, M1-M3 cortex): 7 - Supraganglionic infarction (M4-M6 cortex): 3 Total score (0-10 with 10 being normal): 10 IMPRESSION: 1. No acute intracranial abnormality. 2. Large, old left PCA territory infarct and small old right occipital infarct. 3. ASPECTS is 10. These results were called by telephone  at the time of interpretation on 12/31/2022 at 11:53 pm to provider [facility] , who verbally acknowledged these results. Electronically Signed   By: Deatra Robinson M.D.   On: 12/31/2022 23:53    PROCEDURES and INTERVENTIONS:  .1-3 Lead EKG Interpretation  Performed by: Delton Prairie, MD Authorized by: Delton Prairie, MD     Interpretation: abnormal     ECG rate:  101   ECG rate assessment: tachycardic     Rhythm: sinus tachycardia     Ectopy: none     Conduction: normal   .Critical Care  Performed by: Delton Prairie, MD Authorized by: Delton Prairie, MD   Critical care provider statement:    Critical care time (minutes):  30   Critical  care time was exclusive of:  Separately billable procedures and treating other patients   Critical care was necessary to treat or prevent imminent or life-threatening deterioration of the following conditions:  CNS failure or compromise   Critical care was time spent personally by me on the following activities:  Development of treatment plan with patient or surrogate, discussions with consultants, evaluation of patient's response to treatment, examination of patient, ordering and review of laboratory studies, ordering and review of radiographic studies, ordering and performing treatments and interventions, pulse oximetry, re-evaluation of patient's condition and review of old charts   Medications - No data to display   IMPRESSION / MDM / ASSESSMENT AND PLAN / ED COURSE  I reviewed the triage vital signs and the nursing notes.  Differential diagnosis includes, but is not limited to, stroke, TIA, metabolic encephalopathy, sepsis, UTI  {Patient presents with symptoms of an acute illness or injury that is potentially life-threatening.  62 year old woman with long history of stroke presents after transient episode of confusion seems to be improving on arrival to the ED.  Nonfocal symptoms.  Activated stroke alert process.  No bleed on CT head.  Blood work with  CKD around baseline, normal CBC.  Urine without infectious features.  Clear CXR.  We will admit to medicine for further workup and management for stroke workup for possible TIA.  Clinical Course as of 01/01/23 0144  Mon Jan 01, 2023  0023 Call from neuro. Suspect metabolic [DS]  0142 Consult with medicine who agrees to admit [DS]    Clinical Course User Index [DS] Delton Prairie, MD     FINAL CLINICAL IMPRESSION(S) / ED DIAGNOSES   Final diagnoses:  Confusion  Altered mental status, unspecified altered mental status type     Rx / DC Orders   ED Discharge Orders     None        Note:  This document was prepared using Dragon voice recognition software and may include unintentional dictation errors.   Delton Prairie, MD 01/01/23 [phone]

## 2022-12-31 NOTE — ED Triage Notes (Signed)
First RN Note: Per EMS pt from home, per [facility] family reports at 2200 pt had acute onset confusion and altered mental status. EMS reports per family pt has hx of 2 CVA's with residual deficits. Per EMS pt with clear speech however noted confusion on assessment. Per EMS at baseline pt with GCS of 15, EMS however would not follow any commands en route.    [phone] is patient's husband   180/90 100HR 98.5

## 2022-12-31 NOTE — ED Notes (Signed)
Pt brought to ed rm 3, MD Katrinka Blazing notified of possible stroke alert and assessed the pt

## 2022-12-31 NOTE — ED Notes (Signed)
Pt is holding her chest but isn't answering about whether she is in pain or not.

## 2022-12-31 NOTE — ED Notes (Signed)
Per husband they were in bed approx 2200 when he noticed pt seemed confused. Per husband pt was asking him to help her in to bed even though they were already in there. Current FSBS is 103

## 2023-01-01 ENCOUNTER — Emergency Department: Payer: Medicare HMO

## 2023-01-01 ENCOUNTER — Observation Stay: Payer: Medicare HMO

## 2023-01-01 ENCOUNTER — Encounter: Payer: Self-pay | Admitting: Family Medicine

## 2023-01-01 DIAGNOSIS — R4701 Aphasia: Secondary | ICD-10-CM

## 2023-01-01 DIAGNOSIS — I6612 Occlusion and stenosis of left anterior cerebral artery: Secondary | ICD-10-CM | POA: Diagnosis present

## 2023-01-01 DIAGNOSIS — K219 Gastro-esophageal reflux disease without esophagitis: Secondary | ICD-10-CM | POA: Insufficient documentation

## 2023-01-01 DIAGNOSIS — E785 Hyperlipidemia, unspecified: Secondary | ICD-10-CM | POA: Diagnosis present

## 2023-01-01 DIAGNOSIS — Z87891 Personal history of nicotine dependence: Secondary | ICD-10-CM | POA: Diagnosis not present

## 2023-01-01 DIAGNOSIS — I6603 Occlusion and stenosis of bilateral middle cerebral arteries: Secondary | ICD-10-CM | POA: Diagnosis present

## 2023-01-01 DIAGNOSIS — E872 Acidosis, unspecified: Secondary | ICD-10-CM | POA: Diagnosis not present

## 2023-01-01 DIAGNOSIS — Z79899 Other long term (current) drug therapy: Secondary | ICD-10-CM | POA: Diagnosis not present

## 2023-01-01 DIAGNOSIS — E1122 Type 2 diabetes mellitus with diabetic chronic kidney disease: Secondary | ICD-10-CM | POA: Diagnosis present

## 2023-01-01 DIAGNOSIS — E639 Nutritional deficiency, unspecified: Secondary | ICD-10-CM | POA: Diagnosis present

## 2023-01-01 DIAGNOSIS — G459 Transient cerebral ischemic attack, unspecified: Secondary | ICD-10-CM | POA: Diagnosis present

## 2023-01-01 DIAGNOSIS — G40909 Epilepsy, unspecified, not intractable, without status epilepticus: Secondary | ICD-10-CM

## 2023-01-01 DIAGNOSIS — F0390 Unspecified dementia without behavioral disturbance: Secondary | ICD-10-CM | POA: Diagnosis present

## 2023-01-01 DIAGNOSIS — I129 Hypertensive chronic kidney disease with stage 1 through stage 4 chronic kidney disease, or unspecified chronic kidney disease: Secondary | ICD-10-CM | POA: Diagnosis present

## 2023-01-01 DIAGNOSIS — E876 Hypokalemia: Secondary | ICD-10-CM | POA: Diagnosis not present

## 2023-01-01 DIAGNOSIS — Z66 Do not resuscitate: Secondary | ICD-10-CM | POA: Diagnosis present

## 2023-01-01 DIAGNOSIS — I6622 Occlusion and stenosis of left posterior cerebral artery: Secondary | ICD-10-CM | POA: Diagnosis present

## 2023-01-01 DIAGNOSIS — I639 Cerebral infarction, unspecified: Secondary | ICD-10-CM

## 2023-01-01 DIAGNOSIS — N184 Chronic kidney disease, stage 4 (severe): Secondary | ICD-10-CM | POA: Diagnosis present

## 2023-01-01 DIAGNOSIS — R4189 Other symptoms and signs involving cognitive functions and awareness: Secondary | ICD-10-CM | POA: Diagnosis present

## 2023-01-01 DIAGNOSIS — Z7982 Long term (current) use of aspirin: Secondary | ICD-10-CM | POA: Diagnosis not present

## 2023-01-01 DIAGNOSIS — E11649 Type 2 diabetes mellitus with hypoglycemia without coma: Secondary | ICD-10-CM | POA: Diagnosis not present

## 2023-01-01 DIAGNOSIS — R29715 NIHSS score 15: Secondary | ICD-10-CM | POA: Diagnosis present

## 2023-01-01 DIAGNOSIS — Z794 Long term (current) use of insulin: Secondary | ICD-10-CM | POA: Diagnosis not present

## 2023-01-01 DIAGNOSIS — I6381 Other cerebral infarction due to occlusion or stenosis of small artery: Secondary | ICD-10-CM | POA: Diagnosis present

## 2023-01-01 DIAGNOSIS — G9340 Encephalopathy, unspecified: Secondary | ICD-10-CM

## 2023-01-01 DIAGNOSIS — I10 Essential (primary) hypertension: Secondary | ICD-10-CM | POA: Insufficient documentation

## 2023-01-01 LAB — URINE DRUG SCREEN, QUALITATIVE (ARMC ONLY)
Amphetamines, Ur Screen: NOT DETECTED
Barbiturates, Ur Screen: NOT DETECTED
Benzodiazepine, Ur Scrn: NOT DETECTED
Cannabinoid 50 Ng, Ur ~~LOC~~: NOT DETECTED
Cocaine Metabolite,Ur ~~LOC~~: NOT DETECTED
MDMA (Ecstasy)Ur Screen: NOT DETECTED
Methadone Scn, Ur: NOT DETECTED
Opiate, Ur Screen: NOT DETECTED
Phencyclidine (PCP) Ur S: NOT DETECTED

## 2023-01-01 LAB — COMPREHENSIVE METABOLIC PANEL
ALT: 13 U/L (ref 0–44)
AST: 30 U/L (ref 15–41)
Albumin: 3.6 g/dL (ref 3.5–5.0)
Alkaline Phosphatase: 81 U/L (ref 38–126)
Anion gap: 9 (ref 5–15)
BUN: 24 mg/dL — ABNORMAL HIGH (ref 8–23)
CO2: 26 mmol/L (ref 22–32)
Calcium: 9.1 mg/dL (ref 8.9–10.3)
Chloride: 103 mmol/L (ref 98–111)
Creatinine, Ser: 1.8 mg/dL — ABNORMAL HIGH (ref 0.44–1.00)
GFR, Estimated: 31 mL/min — ABNORMAL LOW (ref >60)
Glucose, Bld: 124 mg/dL — ABNORMAL HIGH (ref 70–99)
Potassium: 3.7 mmol/L (ref 3.5–5.1)
Sodium: 138 mmol/L (ref 135–145)
Total Bilirubin: 0.8 mg/dL (ref 0.3–1.2)
Total Protein: 8.3 g/dL — ABNORMAL HIGH (ref 6.5–8.1)

## 2023-01-01 LAB — URINALYSIS, ROUTINE W REFLEX MICROSCOPIC
Bilirubin Urine: NEGATIVE
Glucose, UA: >=500 mg/dL — AB
Ketones, ur: NEGATIVE mg/dL
Leukocytes,Ua: NEGATIVE
Nitrite: NEGATIVE
Protein, ur: 100 mg/dL — AB
Specific Gravity, Urine: 1.003 — ABNORMAL LOW (ref 1.005–1.030)
pH: 5 (ref 5.0–8.0)

## 2023-01-01 LAB — GLUCOSE, CAPILLARY
Glucose-Capillary: 188 mg/dL — ABNORMAL HIGH (ref 70–99)
Glucose-Capillary: 194 mg/dL — ABNORMAL HIGH (ref 70–99)
Glucose-Capillary: 191 mg/dL — ABNORMAL HIGH (ref 70–99)
Glucose-Capillary: 298 mg/dL — ABNORMAL HIGH (ref 70–99)

## 2023-01-01 LAB — CBC
HCT: 39.1 % (ref 36.0–46.0)
HCT: 40.1 % (ref 36.0–46.0)
Hemoglobin: 13.1 g/dL (ref 12.0–15.0)
Hemoglobin: 13.2 g/dL (ref 12.0–15.0)
MCH: 28.2 pg (ref 26.0–34.0)
MCH: 28.8 pg (ref 26.0–34.0)
MCHC: 32.7 g/dL (ref 30.0–36.0)
MCHC: 33.8 g/dL (ref 30.0–36.0)
MCV: 85.2 fL (ref 80.0–100.0)
MCV: 86.4 fL (ref 80.0–100.0)
Platelets: 282 10*3/uL (ref 150–400)
Platelets: 300 10*3/uL (ref 150–400)
RBC: 4.59 MIL/uL (ref 3.87–5.11)
RBC: 4.64 MIL/uL (ref 3.87–5.11)
RDW: 14.6 % (ref 11.5–15.5)
RDW: 15.2 % (ref 11.5–15.5)
WBC: 5.5 10*3/uL (ref 4.0–10.5)
WBC: 6.4 10*3/uL (ref 4.0–10.5)
nRBC: 0 % (ref 0.0–0.2)
nRBC: 0 % (ref 0.0–0.2)

## 2023-01-01 LAB — LIPID PANEL
Cholesterol: 267 mg/dL — ABNORMAL HIGH (ref 0–200)
HDL: 82 mg/dL (ref >40)
LDL Cholesterol: 169 mg/dL — ABNORMAL HIGH (ref 0–99)
Total CHOL/HDL Ratio: 3.3 RATIO
Triglycerides: 82 mg/dL (ref <150)
VLDL: 16 mg/dL (ref 0–40)

## 2023-01-01 LAB — BASIC METABOLIC PANEL
Anion gap: 10 (ref 5–15)
BUN: 20 mg/dL (ref 8–23)
CO2: 25 mmol/L (ref 22–32)
Calcium: 9 mg/dL (ref 8.9–10.3)
Chloride: 105 mmol/L (ref 98–111)
Creatinine, Ser: 1.63 mg/dL — ABNORMAL HIGH (ref 0.44–1.00)
GFR, Estimated: 35 mL/min — ABNORMAL LOW (ref >60)
Glucose, Bld: 121 mg/dL — ABNORMAL HIGH (ref 70–99)
Potassium: 3.2 mmol/L — ABNORMAL LOW (ref 3.5–5.1)
Sodium: 140 mmol/L (ref 135–145)

## 2023-01-01 LAB — HEMOGLOBIN A1C
Hgb A1c MFr Bld: 8.7 % — ABNORMAL HIGH (ref 4.8–5.6)
Mean Plasma Glucose: 202.99 mg/dL

## 2023-01-01 LAB — APTT: aPTT: 29 seconds (ref 24–36)

## 2023-01-01 LAB — DIFFERENTIAL
Abs Immature Granulocytes: 0.01 10*3/uL (ref 0.00–0.07)
Basophils Absolute: 0 10*3/uL (ref 0.0–0.1)
Basophils Relative: 0 %
Eosinophils Absolute: 0.1 10*3/uL (ref 0.0–0.5)
Eosinophils Relative: 2 %
Immature Granulocytes: 0 %
Lymphocytes Relative: 43 %
Lymphs Abs: 2.8 10*3/uL (ref 0.7–4.0)
Monocytes Absolute: 0.6 10*3/uL (ref 0.1–1.0)
Monocytes Relative: 10 %
Neutro Abs: 2.8 10*3/uL (ref 1.7–7.7)
Neutrophils Relative %: 45 %

## 2023-01-01 LAB — ETHANOL: Alcohol, Ethyl (B): <10 mg/dL (ref <10)

## 2023-01-01 LAB — MAGNESIUM: Magnesium: 1.8 mg/dL (ref 1.7–2.4)

## 2023-01-01 LAB — PHOSPHORUS: Phosphorus: 3 mg/dL (ref 2.5–4.6)

## 2023-01-01 LAB — PROTIME-INR
INR: 1 (ref 0.8–1.2)
Prothrombin Time: 13.1 seconds (ref 11.4–15.2)

## 2023-01-01 LAB — TSH: TSH: 0.733 u[IU]/mL (ref 0.350–4.500)

## 2023-01-01 MED ORDER — ASPIRIN 81 MG PO TBEC
81.0000 mg | DELAYED_RELEASE_TABLET | Freq: Every day | ORAL | Status: DC
  Administered 2023-01-01: 81 mg via ORAL
  Filled 2023-01-01: qty 1

## 2023-01-01 MED ORDER — TRAZODONE HCL 50 MG PO TABS
25.0000 mg | ORAL_TABLET | Freq: Every evening | ORAL | Status: DC | PRN
  Filled 2023-01-01: qty 1

## 2023-01-01 MED ORDER — AMLODIPINE BESYLATE 10 MG PO TABS
10.0000 mg | ORAL_TABLET | Freq: Every day | ORAL | Status: DC
  Administered 2023-01-02 – 2023-01-04 (×3): 10 mg via ORAL
  Filled 2023-01-01 (×3): qty 1

## 2023-01-01 MED ORDER — ADULT MULTIVITAMIN W/MINERALS CH
1.0000 | ORAL_TABLET | Freq: Every day | ORAL | Status: DC
  Administered 2023-01-02 – 2023-01-04 (×3): 1 via ORAL
  Filled 2023-01-01 (×3): qty 1

## 2023-01-01 MED ORDER — CLOPIDOGREL BISULFATE 75 MG PO TABS
75.0000 mg | ORAL_TABLET | Freq: Every day | ORAL | Status: DC
  Administered 2023-01-02 – 2023-01-04 (×3): 75 mg via ORAL
  Filled 2023-01-01 (×3): qty 1

## 2023-01-01 MED ORDER — HYDRALAZINE HCL 20 MG/ML IJ SOLN: 5.0000 mg | INTRAMUSCULAR | Status: DC | PRN

## 2023-01-01 MED ORDER — HYDRALAZINE HCL 50 MG PO TABS
25.0000 mg | ORAL_TABLET | Freq: Three times a day (TID) | ORAL | Status: DC
  Administered 2023-01-02: 25 mg via ORAL
  Filled 2023-01-01: qty 1

## 2023-01-01 MED ORDER — ONDANSETRON HCL 4 MG/2ML IJ SOLN: 4.0000 mg | Freq: Four times a day (QID) | INTRAMUSCULAR | Status: DC | PRN

## 2023-01-01 MED ORDER — POTASSIUM CHLORIDE CRYS ER 20 MEQ PO TBCR
40.0000 meq | EXTENDED_RELEASE_TABLET | Freq: Once | ORAL | Status: AC
  Administered 2023-01-01: 40 meq via ORAL
  Filled 2023-01-01: qty 2

## 2023-01-01 MED ORDER — ENSURE ENLIVE PO LIQD
237.0000 mL | Freq: Two times a day (BID) | ORAL | Status: DC
  Administered 2023-01-01 – 2023-01-04 (×4): 237 mL via ORAL

## 2023-01-01 MED ORDER — SENNOSIDES-DOCUSATE SODIUM 8.6-50 MG PO TABS
2.0000 | ORAL_TABLET | Freq: Every day | ORAL | Status: DC
  Administered 2023-01-01 – 2023-01-03 (×3): 2 via ORAL
  Filled 2023-01-01 (×3): qty 2

## 2023-01-01 MED ORDER — ACETAMINOPHEN 325 MG PO TABS: 650.0000 mg | ORAL_TABLET | Freq: Four times a day (QID) | ORAL | Status: DC | PRN

## 2023-01-01 MED ORDER — MAGNESIUM HYDROXIDE 400 MG/5ML PO SUSP: 30.0000 mL | Freq: Every day | ORAL | Status: DC | PRN

## 2023-01-01 MED ORDER — AMLODIPINE BESYLATE 10 MG PO TABS: 10.0000 mg | ORAL_TABLET | Freq: Every day | ORAL | Status: DC

## 2023-01-01 MED ORDER — INSULIN GLARGINE-YFGN 100 UNIT/ML ~~LOC~~ SOLN
10.0000 [IU] | Freq: Every day | SUBCUTANEOUS | Status: DC
  Administered 2023-01-01: 10 [IU] via SUBCUTANEOUS
  Filled 2023-01-01 (×2): qty 0.1

## 2023-01-01 MED ORDER — HYDRALAZINE HCL 50 MG PO TABS: 25.0000 mg | ORAL_TABLET | Freq: Three times a day (TID) | ORAL | Status: DC

## 2023-01-01 MED ORDER — ACETAMINOPHEN 650 MG RE SUPP: 650.0000 mg | Freq: Four times a day (QID) | RECTAL | Status: DC | PRN

## 2023-01-01 MED ORDER — IOHEXOL 350 MG/ML SOLN
75.0000 mL | Freq: Once | INTRAVENOUS | Status: AC | PRN
  Administered 2023-01-01: 60 mL via INTRAVENOUS

## 2023-01-01 MED ORDER — STROKE: EARLY STAGES OF RECOVERY BOOK: Freq: Once | Status: AC

## 2023-01-01 MED ORDER — ONDANSETRON HCL 4 MG PO TABS: 4.0000 mg | ORAL_TABLET | Freq: Four times a day (QID) | ORAL | Status: DC | PRN

## 2023-01-01 MED ORDER — POLYETHYLENE GLYCOL 3350 17 G PO PACK
17.0000 g | PACK | Freq: Every day | ORAL | Status: DC
  Administered 2023-01-01 – 2023-01-04 (×4): 17 g via ORAL
  Filled 2023-01-01 (×4): qty 1

## 2023-01-01 MED ORDER — ROSUVASTATIN CALCIUM 20 MG PO TABS
20.0000 mg | ORAL_TABLET | Freq: Every day | ORAL | Status: DC
  Administered 2023-01-01 – 2023-01-02 (×2): 20 mg via ORAL
  Filled 2023-01-01 (×2): qty 1

## 2023-01-01 MED ORDER — PANTOPRAZOLE SODIUM 40 MG PO TBEC
40.0000 mg | DELAYED_RELEASE_TABLET | Freq: Every day | ORAL | Status: DC
  Administered 2023-01-01 – 2023-01-03 (×3): 40 mg via ORAL
  Filled 2023-01-01 (×3): qty 1

## 2023-01-01 MED ORDER — CLOPIDOGREL BISULFATE 75 MG PO TABS: 75.0000 mg | ORAL_TABLET | Freq: Every day | ORAL | Status: DC

## 2023-01-01 MED ORDER — ASPIRIN 81 MG PO TBEC
81.0000 mg | DELAYED_RELEASE_TABLET | Freq: Every day | ORAL | Status: DC
  Administered 2023-01-02 – 2023-01-04 (×3): 81 mg via ORAL
  Filled 2023-01-01 (×3): qty 1

## 2023-01-01 MED ORDER — LEVETIRACETAM 250 MG PO TABS
250.0000 mg | ORAL_TABLET | Freq: Two times a day (BID) | ORAL | Status: DC
  Administered 2023-01-01 – 2023-01-04 (×7): 250 mg via ORAL
  Filled 2023-01-01 (×8): qty 1

## 2023-01-01 MED ORDER — INSULIN ASPART 100 UNIT/ML IJ SOLN
0.0000 [IU] | Freq: Three times a day (TID) | INTRAMUSCULAR | Status: DC
  Administered 2023-01-01 – 2023-01-02 (×3): 3 [IU] via SUBCUTANEOUS
  Administered 2023-01-02 (×2): 5 [IU] via SUBCUTANEOUS
  Administered 2023-01-03: 3 [IU] via SUBCUTANEOUS
  Filled 2023-01-01 (×3): qty 1

## 2023-01-01 MED ORDER — SODIUM CHLORIDE 0.9 % IV SOLN
INTRAVENOUS | Status: DC
  Administered 2023-01-01: 100 mL/h via INTRAVENOUS

## 2023-01-01 MED ORDER — ENOXAPARIN SODIUM 40 MG/0.4ML IJ SOSY
40.0000 mg | PREFILLED_SYRINGE | INTRAMUSCULAR | Status: DC
  Administered 2023-01-01 – 2023-01-04 (×4): 40 mg via SUBCUTANEOUS
  Filled 2023-01-01 (×4): qty 0.4

## 2023-01-01 NOTE — Consult Note (Addendum)
[facility]  [facility] TeleNeurology Consult Services   Patient Name:   [name] Gilbert, [name] Gilbert Date of Birth:   10-30-1960 Identification Number:   MRN - [id number] Date of Service:   12/31/2022 23:49:30  Diagnosis:       G93.41 - Encephalopathy Metabolic  Impression:      Patient with subtle confusion from poor baseline with some worsening slurred speech no notable focal facial droop or focal weakness etiology favoring global encephalopathy likely secondary to toxic-metabolic encephalopathy suspected secondary to multifactorial causes such as infection, electrolyte derangements, malnutrition and medication effect. Potentially sudden lower blood sugar may have affected the patient as typically averages in 200-400 advised continued close moniotirng with outpatient providor to ensure strict blood sugar control.      Recommendations:  - Medical workup for toxic-metabolic etiology per primary consider as follows: UDS, BCx, UA, CBC, CMP, Glucose, TSH, RPR, Ammonia, B12, Folate, ABG, CXR  - Limit any unnecessary sedating medications, including benzo's, opiates, anesthesia, etc.  - Precautions: Seizure, Fall, and Universal Delirium      If patient without revealing medical workup and with continued encephalopathy consider further imaging as follows:  Diagnostic: MRI brain WO    Ensure on secondary prevention for prior stroke  Continue home ASA 81 mg and High Intensity Statin  Goal LDL <70 and HgA1c<7%  F/U PCP upon DC for continued secondary stroke prevention  Encourage smoking cessation with nicotine patches if applicable      ------------------------------------------------------------------------------  Advanced Imaging: Advanced Imaging Deferred because:  Non-disabling symptoms as verified by the patient; no cortical signs so not consistent with LVO   Metrics: Last Known Well: 12/31/2022 21:30:00 [facility] Notification Time: 12/31/2022 23:49:30 Arrival Time:  12/31/2022 23:02:00 Stamp Time: 12/31/2022 23:49:30 Initial Response Time: 12/31/2022 23:52:59 Symptoms: confusion . Initial patient interaction: 12/31/2022 23:57:10 NIHSS Assessment Completed: 12/31/2022 23:57:12 Patient is not a candidate for Thrombolytic. Thrombolytic Medical Decision: 01/01/2023 00:09:13 Patient was not deemed candidate for Thrombolytic because of following reasons: Current or Previous ICH.  CT head showed no acute hemorrhage or acute core infarct. per study report chronic infarction L PCA infarct, R occipital  Primary Provider Notified of Diagnostic Impression and Management Plan on: 01/01/2023 00:23:28    ------------------------------------------------------------------------------  History of Present Illness: Patient is a 62 year old Female.  Patient was brought by EMS for symptoms of confusion . Patient presenting with her husband. Patient does have a history of several strokes with a small pontine hemorrhage in February 2024 at baseline the patient is bedbound and only oriented to self and somewhat situation.  Per the patient's husband her blood sugars usually range from 200s to 400s. Around 3 PM her blood sugar was around 300 later that evening they were in bed watching TV the patient appeared confused as she was asking when they were going to bed and he was telling her that they were already in bed. He rechecked her blood sugar and it was 164 which is low for her. Otherwise no notable focal weakness noted. Some slurring of speech with no notable focal facial droop.  Currently patient only complaining that she feels somewhat confused. Otherwise no further complaints.  No notable recent sick contacts or recent illness.   Past Medical History:      Hypertension      Diabetes Mellitus      Hyperlipidemia  Medications:  No Anticoagulant use  Antiplatelet use: Yes asa 81 mg Reviewed EMR for current medications  Allergies:  Reviewed  Social  History: Smoking: Former  Family History:  There is no family history of premature cerebrovascular disease pertinent to this consultation  ROS : 14 Points Review of Systems was performed and was negative except mentioned in HPI.  Past Surgical History: There Is No Surgical History Contributory To Today's Visit   Examination: BP(156/92), Pulse(99), Blood Glucose(103) 1A: Level of Consciousness - Alert; keenly responsive + 0 1B: Ask Month and Age - 1 Question Right + 1 1C: Blink Eyes & Squeeze Hands - Performs Both Tasks + 0 2: Test Horizontal Extraocular Movements - Normal + 0 3: Test Visual Fields - Complete Hemianopia + 2 4: Test Facial Palsy (Use Grimace if Obtunded) - Normal symmetry + 0 5A: Test Left Arm Motor Drift - No Drift for 10 Seconds + 0 5B: Test Right Arm Motor Drift - No Drift for 10 Seconds + 0 6A: Test Left Leg Motor Drift - Drift, but doesn't hit bed + 1 6B: Test Right Leg Motor Drift - Drift, but doesn't hit bed + 1 7: Test Limb Ataxia (FNF/Heel-Shin) - No Ataxia + 0 8: Test Sensation - Normal; No sensory loss + 0 9: Test Language/Aphasia - Normal; No aphasia + 0 10: Test Dysarthria - Normal + 0 11: Test Extinction/Inattention - No abnormality + 0  NIHSS Score: 5  NIHSS Free Text : corrected NIH likely 0  R HH  Pre-Morbid Modified Rankin Scale: 5 Points = Severe disability; bedridden, incontinent and requiring constant nursing care and attention  Spoke with : smith  Patient/Family was informed the Neurology Consult would occur via TeleHealth consult by way of interactive audio and video telecommunications and consented to receiving care in this manner.   Patient is being evaluated for possible acute neurologic impairment and high probability of imminent or life-threatening deterioration. I spent total of 45 minutes providing care to this patient, including time for face to face visit via telemedicine, review of medical records, imaging studies and  discussion of findings with providers, the patient and/or family.   Dr Arlyce Harman   [facility] For Inpatient follow-up with [facility] physician please call [facility] [phone]. This is not an outpatient service. Post hospital discharge, please contact hospital directly.  Please do not communicate with [facility] physicians via secure chat. If you have any questions, Please contact [facility]. Please call or reconsult our service if there are any clinical or diagnostic changes.

## 2023-01-01 NOTE — ED Notes (Signed)
Patient transported to MRI 

## 2023-01-01 NOTE — Progress Notes (Signed)
Neurology progress note  S: Patient is oriented only to self. States she is feeling out of it. No family at bedside.  Today's Vitals   01/01/23 0538 01/01/23 0825 01/01/23 0843 01/01/23 0849  BP: (!) 149/82  (!) 155/78   Pulse: 86  (!) 113 92  Resp: 20  18   Temp: 98.7 F (37.1 C)  97.9 F (36.6 C)   TempSrc:      SpO2: 100%  100%   Weight:      PainSc:  0-No pain     Body mass index is 30.56 kg/m.   Physical Exam Gen: oriented to self only, NAD Resp: CTAB, normal work of breathing CV: RRR, extremities appear well-perfused.  Neuro: *MS: oriented to self only, follows single step commands *Speech: delayed, mild dysarthria and aphasia *CN:    I: Deferred   II,III: PERRLA, blinks to threat on L only, optic discs not visualized 2/2 pupillary constriction   III,IV,VI: EOMI w/o nystagmus, no ptosis   V: Sensation intact from V1 to V3 to LT   VII: Eyelid closure was full.  Smile symmetric.   VIII: Hearing intact to voice   IX,X: Voice normal, palate elevates symmetrically    XI: SCM/trap 5/5 bilat   XII: Tongue protrudes midline, no atrophy or fasciculations  *Motor:   Normal bulk.  No tremor, rigidity or bradykinesia. Anti-gravity BUE with drift on L and drift to bed on R. Able to extend both legs against the bed but 2/5 strength BLE throughout. *Sensory: Intact to light touch, pinprick, temperature vibration throughout. Symmetric. Propioception intact bilat.  No double-simultaneous extinction.  *Coordination:  dysmetria R>L *Reflexes:  1+ and symmetric throughout without clonus; toes down-going bilat *Gait: deferred  NIHSS 1a Level of Conscious.: 0 1b LOC Questions: 2 1c LOC Commands: 0 2 Best Gaze: 0 3 Visual: 1 4 Facial Palsy: 0 5a Motor Arm - left: 1 5b Motor Arm - Right: 2 6a Motor Leg - Left:  6b Motor Leg - Right: 3 7 Limb Ataxia: 3 8 Sensory: 0 9 Best Language: 1 10 Dysarthria: 1 11 Extinct. and Inatten.: 0  TOTAL: 14 (based on patient's baseline  function this does not appear to be very different than baseline)  Data  MRI brain showed small foci of acute ischemia in the R frontal corona radiata and inferior margin of the R lentiform nucleus  CTA H&N 1. No emergent large vessel occlusion. 2. Short segment occlusion of the left anterior cerebral artery A2 segment, which is patent distally. Unchanged compared to 10/15/2022. 3. 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 4. Occlusion of the left PCA, unchanged compared to 10/15/2022. 5. No hemodynamically significant stenosis of the neck.  CNS imaging personally reviewed; I agree with above interpretations  TTE performed 10/15/22, does not need to be repeated 1. Left ventricular ejection fraction, by estimation, is 60 to 65% . The left ventricle has normal function. The left ventricle has no regional wall motion abnormalities. Left ventricular diastolic parameters are consistent with Grade I diastolic dysfunction ( impaired relaxation) . 2. Right ventricular systolic function is normal. The right ventricular size is not well visualized. Tricuspid regurgitation signal is inadequate for assessing PA pressure. 3. No evidence of mitral valve regurgitation. 4. The aortic valve is tricuspid. Aortic valve regurgitation is not visualized. 5. The inferior vena cava is normal in size with greater than 50% respiratory variability, suggesting right atrial pressure of 3 mmHg.  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 She was seen by teleneurology overnight. MRI brain showed small focus of acute ischemia in R frontal lobe. Etiology of infarct favored to be lacunar / small vessel stroke. Her stroke workup is completed other than SLP/PT/OT evaluations.  Recommendations: - Permissive HTN x48 hrs from sx onset or until stroke ruled out by MRI goal BP <180/105 (modified permissive HTN due to pontine hemorrhage in Feb). PRN labetalol or hydralazine if BP above these parameters. Avoid oral antihypertensives. 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  Bing Neighbors, MD [facility] [phone]

## 2023-01-01 NOTE — ED Notes (Signed)
Patient provided blankets for warmth.

## 2023-01-01 NOTE — Plan of Care (Deleted)
Opened in error

## 2023-01-01 NOTE — ED Notes (Signed)
Patient has returned from MRI and CT.

## 2023-01-01 NOTE — H&P (Addendum)
Please see     [place]   PATIENT NAME: [name] Gilbert    MR#:  [id number]  DATE OF BIRTH:  1960-12-07  DATE OF ADMISSION:  12/31/2022  PRIMARY CARE PHYSICIAN: Tobey Grim, MD   Patient is coming from: Home  REQUESTING/REFERRING PHYSICIAN: Delton Prairie, MD  CHIEF COMPLAINT:   Chief Complaint  Patient presents with   Altered Mental Status    HISTORY OF PRESENT ILLNESS:  Corina Stacy is a 62 y.o. 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  She was admitted in February and discharged on 2/14 after being managed for acute stroke with mixed hemorrhagic and ischemic component.  No new focal muscle weakness.    She then went to short-term rehabitation for a couple weeks and later returned home.  She is conversant at baseline and interactive but is not able to walk.  Her confusion and aphasia tonight started at 10 PM and her husband stated that her last time she was normal was at 7 PM.  She was watching a movie when her symptoms started and started asking unusual questions to her husband that she described weird. She is weak on the right side with right upper extremity contractures.  She also states that she had stroke affecting the left side as well.  She denies any headache or dizziness or blurred vision.  No chest pain or palpitations.  No fever or chills.  She admits to mild vertigo without tinnitus.  No weakness seizures.  No urinary or stool incontinence.  ED Course: When she came to the ER, BP was 156/92 with otherwise normal vital signs.  Labs revealed a BUN of 24 with a creatinine 1.8 compared to 38 and 2.97.  Total protein was 8.3.  CBC was within normal and UA showed more than 500 glucose, 100  protein and specific gravity 1003 with rare bacteria and only 0-5 WBCs.  Urine drug screen came back negative and alcohol level was less than 10.   EKG as reviewed by me : EKG showed normal sinus rhythm with a rate of 100 and right axis deviation. Imaging: Portable x-ray showed no acute cardiopulmonary disease.  The patient will be admitted to an observation medical telemetry bed for further evaluation and management. PAST MEDICAL HISTORY:   Past Medical History:  Diagnosis Date   Diabetes mellitus without complication    Hypertension    Stroke    visual impaired     PAST SURGICAL HISTORY:   Past Surgical History:  Procedure Laterality Date   ABDOMINAL HYSTERECTOMY     TONSILLECTOMY      SOCIAL HISTORY:   Social History   Tobacco Use   Smoking status: Never    Passive exposure: Never   Smokeless tobacco: Never  Substance Use Topics   Alcohol use: No    FAMILY HISTORY:   Family History  Problem Relation Age of Onset   Diabetes Father     DRUG ALLERGIES:   Allergies  Allergen Reactions   Metformin Other (See Comments)    Reaction: unknown   Penicillins Hives, Nausea And Vomiting and Swelling    Has patient had a PCN reaction causing immediate rash, facial/tongue/throat swelling, SOB or lightheadedness with hypotension: Yes Has patient had a PCN reaction causing  severe rash involving mucus membranes or skin necrosis: No Has patient had a PCN reaction that required hospitalization Yes Has patient had a PCN reaction occurring within the last 10 years: No If all of the above answers are "NO", then may proceed with Cephalosporin use.    Codeine Other (See Comments)    Reaction: HIVES   Hydralazine     Stomach pain   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    REVIEW OF SYSTEMS:   ROS As per history of present illness. All pertinent systems were reviewed above. Constitutional, HEENT, cardiovascular, respiratory, GI, GU, musculoskeletal, neuro,  psychiatric, endocrine, integumentary and hematologic systems were reviewed and are otherwise negative/unremarkable except for positive findings mentioned above in the HPI.   MEDICATIONS AT HOME:   Prior to Admission medications   Medication Sig Start Date End Date Taking? Authorizing Provider  acetaminophen (TYLENOL) 325 MG tablet Take 2 tablets (650 mg total) by mouth every 4 (four) hours as needed for mild pain (or temp > 37.5 C (99.5 F)). 10/25/22  Yes Osvaldo Shipper, MD  amLODipine (NORVASC) 10 MG tablet Take 10 mg by mouth daily. 10/03/22  Yes [provider]  aspirin EC 81 MG tablet Take 1 tablet (81 mg total) by mouth daily. Swallow whole. 10/26/22  Yes Osvaldo Shipper, MD  hydrALAZINE (APRESOLINE) 25 MG tablet Take 25 mg by mouth 3 (three) times daily.   Yes [provider]  Insulin Aspart FlexPen (NOVOLOG) 100 UNIT/ML Use up to 15 units/day, divided three times daily with meals as per MD instructions 11/30/22  Yes [provider]  polyethylene glycol (MIRALAX / GLYCOLAX) 17 g packet Take 17 g by mouth daily. 11/30/22  Yes [provider]  rosuvastatin (CRESTOR) 20 MG tablet Take 20 mg by mouth at bedtime.   Yes [provider]  senna-docusate (SENOKOT-S) 8.6-50 MG tablet Take 2 tablets by mouth at bedtime. 11/30/22 11/30/23 Yes [provider]  TRESIBA FLEXTOUCH 100 UNIT/ML FlexTouch Pen Inject 10 Units into the skin at bedtime. 12/08/22  Yes [provider]  Vitamin D, Ergocalciferol, (DRISDOL) 1.25 MG (50000 UNIT) CAPS capsule Take 50,000 Units by mouth every 7 (seven) days. 09/06/22  Yes [provider]  B Complex-C (B-COMPLEX WITH VITAMIN C) tablet Take 1 tablet by mouth daily. Patient not taking: Reported on 10/16/2022 02/17/22   Angiulli, Mcarthur Rossetti, PA-C  insulin glargine (LANTUS) 100 UNIT/ML Solostar Pen Inject 5 Units into the skin daily. Patient not taking: Reported on 12/31/2022 10/25/22   Osvaldo Shipper, MD   levETIRAcetam (KEPPRA) 250 MG tablet Take 250 mg by mouth 2 (two) times daily. 10/03/22   [provider]  pantoprazole (PROTONIX) 40 MG tablet Take 1 tablet (40 mg total) by mouth at bedtime. Patient not taking: Reported on 12/31/2022 10/25/22   Osvaldo Shipper, MD      VITAL SIGNS:  Blood pressure (!) 167/83, pulse 91, temperature (!) 97.4 F (36.3 C), temperature source Oral, resp. rate 19, weight 75.8 kg, SpO2 99 %.  PHYSICAL EXAMINATION:  Physical Exam  GENERAL:  62 y.o.-year-old African-American female patient lying in the bed with no acute distress.  EYES: Pupils equal, round, reactive to light and accommodation. No scleral icterus. Extraocular muscles intact.  HEENT: Head atraumatic, normocephalic. Oropharynx and nasopharynx clear.  NECK:  Supple, no jugular venous distention. No thyroid enlargement, no tenderness.  LUNGS: Normal breath sounds bilaterally, no wheezing, rales,rhonchi or crepitation. No use of accessory muscles of respiration.  CARDIOVASCULAR: Regular rate and rhythm, S1, S2 normal. No murmurs, rubs, or gallops.  ABDOMEN: Soft, nondistended, nontender. Bowel sounds present. No organomegaly or mass.  EXTREMITIES: No pedal edema, cyanosis, or clubbing.  NEUROLOGIC: Cranial nerves II through XII are intact except for slower speech. Muscle strength 4/5 in both lower extremities and 1/5 in left upper extremity with left wrist and fingers contractures and 4/5 in the left upper extremity.  Sensation intact. Gait not checked.  PSYCHIATRIC: The patient is alert and oriented x 3.  Normal affect and good eye contact. SKIN: No obvious rash, lesion, or ulcer.   LABORATORY PANEL:   CBC Recent Labs  Lab 12/31/22 2350  WBC 6.4  HGB 13.1  HCT 40.1  PLT 300   ------------------------------------------------------------------------------------------------------------------  Chemistries  Recent Labs  Lab 12/31/22 2350  NA 138  K 3.7  CL 103  CO2 26  GLUCOSE  124*  BUN 24*  CREATININE 1.80*  CALCIUM 9.1  AST 30  ALT 13  ALKPHOS 81  BILITOT 0.8   ------------------------------------------------------------------------------------------------------------------  Cardiac Enzymes No results for input(s): "TROPONINI" in the last 168 hours. ------------------------------------------------------------------------------------------------------------------  RADIOLOGY:  MR BRAIN WO CONTRAST  Result Date: 01/01/2023 CLINICAL DATA:  Transient ischemic attack EXAM: MRI HEAD WITHOUT CONTRAST TECHNIQUE: Multiplanar, multiecho pulse sequences of the brain and surrounding structures were obtained without intravenous contrast. COMPARISON:  10/15/2022 FINDINGS: Brain: Small foci of acute ischemia within the right frontal corona radiata and the inferior margin of the right lentiform nucleus. Old bilateral PCA territory infarcts, left-greater-than-right. Chronic microhemorrhage within the central pons. There is confluent hyperintense T2-weighted signal within the white matter. Generalized volume loss. The midline structures are normal. Vascular: Major flow voids are preserved. Skull and upper cervical spine: Normal calvarium and skull base. Visualized upper cervical spine and soft tissues are normal. Sinuses/Orbits:No paranasal sinus fluid levels or advanced mucosal thickening. No mastoid or middle ear effusion. Normal orbits. IMPRESSION: 1. Small foci of acute ischemia within the right frontal corona radiata and the inferior margin of the right lentiform nucleus. No hemorrhage or mass effect. 2. Old bilateral PCA territory infarcts, left-greater-than-right. Electronically Signed   By: Deatra Robinson M.D.   On: 01/01/2023 03:25   CT ANGIO HEAD NECK W WO CM  Result Date: 01/01/2023 CLINICAL DATA:  Transient ischemic attack EXAM: CT ANGIOGRAPHY HEAD AND NECK WITH AND WITHOUT CONTRAST TECHNIQUE: Multidetector CT imaging of the head and neck was performed using the standard  protocol during bolus administration of intravenous contrast. Multiplanar CT image reconstructions and MIPs were obtained to evaluate the vascular anatomy. Carotid stenosis measurements (when applicable) are obtained utilizing NASCET criteria, using the distal internal carotid diameter as the denominator. RADIATION DOSE REDUCTION: This exam was performed according to the departmental dose-optimization program which includes automated exposure control, adjustment of the mA and/or kV according to patient size and/or use of iterative reconstruction technique. CONTRAST:  60mL OMNIPAQUE IOHEXOL 350 MG/ML SOLN COMPARISON:  10/15/2022 MRA head FINDINGS: CTA NECK FINDINGS SKELETON: There is no bony spinal canal stenosis. No lytic or blastic lesion. OTHER NECK: Normal pharynx, larynx and major salivary glands. No cervical lymphadenopathy. Unremarkable thyroid gland. UPPER CHEST: No pneumothorax or pleural effusion. No nodules or masses. AORTIC ARCH: There is no calcific atherosclerosis of the aortic arch. There is no aneurysm, dissection or hemodynamically significant stenosis of the visualized portion of the aorta. Conventional 3 vessel aortic branching pattern. The visualized proximal subclavian arteries are widely patent. RIGHT CAROTID SYSTEM: Normal without aneurysm, dissection or stenosis. LEFT  CAROTID SYSTEM: Normal without aneurysm, dissection or stenosis. VERTEBRAL ARTERIES: Left dominant configuration. Both origins are clearly patent. There is no dissection, occlusion or flow-limiting stenosis to the skull base (V1-V3 segments). CTA HEAD FINDINGS POSTERIOR CIRCULATION: --Vertebral arteries: Normal V4 segments. --Inferior cerebellar arteries: Normal. --Basilar artery: Normal. --Superior cerebellar arteries: Normal. 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 Left PCA is occluded, unchanged on 10/15/2022. ANTERIOR CIRCULATION: --Intracranial internal carotid arteries:  Normal. --Anterior cerebral arteries (ACA): Short segment occlusion of the left A2 segment, which is patent distally. Unchanged compared to 10/15/2022. 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 VENOUS SINUSES: As permitted by contrast timing, patent. ANATOMIC VARIANTS: None Review of the MIP images confirms the above findings. IMPRESSION: 1. No emergent large vessel occlusion. 2. Short segment occlusion of the left anterior cerebral artery A2 segment, which is patent distally. Unchanged compared to 10/15/2022. 3. 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 4. Occlusion of the left PCA, unchanged compared to 10/15/2022. 5. No hemodynamically significant stenosis of the neck. Electronically Signed   By: Deatra Robinson M.D.   On: 01/01/2023 02:56   DG Chest Portable 1 View  Result Date: 01/01/2023 CLINICAL DATA:  Altered mental status. EXAM: PORTABLE CHEST 1 VIEW COMPARISON:  October 14, 2022 FINDINGS: The heart size and mediastinal contours are within normal limits. Both lungs are clear. The visualized skeletal structures are unremarkable. IMPRESSION: No active disease. Electronically Signed   By: Aram Candela M.D.   On: 01/01/2023 00:38   CT HEAD CODE STROKE WO CONTRAST  Result Date: 12/31/2022 CLINICAL DATA:  Code stroke.  Confusion and aphasia EXAM: CT HEAD WITHOUT CONTRAST TECHNIQUE: Contiguous axial images were obtained from the base of the skull through the vertex without intravenous contrast. RADIATION DOSE REDUCTION: This exam was performed according to the departmental dose-optimization program which includes automated exposure control, adjustment of the mA and/or kV according to patient size and/or use of iterative reconstruction technique. COMPARISON:  None Available. FINDINGS: Brain: There is no mass, hemorrhage or extra-axial collection. The size and configuration of the ventricles  and extra-axial CSF spaces are normal. There is hypoattenuation of the periventricular white matter, most commonly indicating chronic ischemic microangiopathy. Large, old left PCA territory infarct. Small old right occipital infarct. Vascular: No abnormal hyperdensity of the major intracranial arteries or dural venous sinuses. No intracranial atherosclerosis. Skull: The visualized skull base, calvarium and extracranial soft tissues are normal. Sinuses/Orbits: No fluid levels or advanced mucosal thickening of the visualized paranasal sinuses. No mastoid or middle ear effusion. The orbits are normal. ASPECTS [facility] Stroke Program Early CT Score) - Ganglionic level infarction (caudate, lentiform nuclei, internal capsule, insula, M1-M3 cortex): 7 - Supraganglionic infarction (M4-M6 cortex): 3 Total score (0-10 with 10 being normal): 10 IMPRESSION: 1. No acute intracranial abnormality. 2. Large, old left PCA territory infarct and small old right occipital infarct. 3. ASPECTS is 10. These results were called by telephone at the time of interpretation on 12/31/2022 at 11:53 pm to provider [facility] , who verbally acknowledged these results. Electronically Signed   By: Deatra Robinson M.D.   On: 12/31/2022 23:53      IMPRESSION AND PLAN:  Assessment and Plan: * TIA (transient ischemic attack) - The patient will be admitted to an observation medically monitored bed. - This is manifested by transient confusion/acute encephalopathy and expressive aphasia. - We will need to rule out acute evolving CVA. - We  will follow neuro checks q.4 hours for 24 hours.   - The patient will be placed on aspirin.   - Will obtain a brain MRI without contrast as well as head and neck CTA and 2D echo with bubble study .   - A neurology consultation  as well as physical/occupation/speech therapy consults will be obtained in a.m.Marland Kitchen - I notified Dr. Selina Cooley about the patient. - The patient will be placed on statin therapy and fasting  lipids will be checked.   GERD without esophagitis - We will continue PPI therapy.  Dyslipidemia - We will continue statin therapy and check fasting lipids.  Seizure disorder - We will continue Keppra.  Essential hypertension - We will continue her antihypertensives with permissive parameters.  Type II diabetes mellitus with renal manifestations - The patient will be placed on supplemental coverage with NovoLog. - We will continue her basal coverage.   DVT prophylaxis: Lovenox.  Advanced Care Planning:  Code Status: The patient is DNR and DNI.  This was discussed with her. Family Communication:  The plan of care was discussed in details with the patient (and family). I answered all questions. The patient agreed to proceed with the above mentioned plan. Further management will depend upon hospital course. Disposition Plan: Back to previous home environment Consults called: Neurology. All the records are reviewed and case discussed with ED provider.  Status is: Observation   I certify that at the time of admission, it is my clinical judgment that the patient will require  hospital care extending less than 2 midnights.                            Dispo: The patient is from: Home              Anticipated d/c is to: Home              Patient currently is not medically stable to d/c.              Difficult to place patient: No  Hannah Beat M.D on 01/01/2023 at 4:23 AM  [facility]   From 7 PM-7 AM, contact night-coverage [email or web address]  CC: Primary care physician; Tobey Grim, MD

## 2023-01-01 NOTE — Assessment & Plan Note (Signed)
-   The patient will be placed on supplemental coverage with NovoLog. - We will continue her basal coverage. 

## 2023-01-01 NOTE — Consult Note (Signed)
2347 code stroke cart activation; change in speech 2349: Page sent to telespecialist 2353: Dr. Roselind Messier on screen. History provided to Dr. Roselind Messier.  2356: TSP on with patient 0003: NCCT results given to Dr. Roselind Messier. No acute abnormality. CT shows old stroke.  0011: No further needs from [facility], call disconnected  MRS: 5 NIH: 5 LKWT: 2130

## 2023-01-01 NOTE — Assessment & Plan Note (Addendum)
-   The patient will be admitted to an observation medically monitored bed. - This is manifested by transient confusion/acute encephalopathy and expressive aphasia. - We will need to rule out acute evolving CVA. - We will follow neuro checks q.4 hours for 24 hours.   - The patient will be placed on aspirin.   - Will obtain a brain MRI without contrast as well as head and neck CTA and 2D echo with bubble study .   - A neurology consultation  as well as physical/occupation/speech therapy consults will be obtained in a.m.Marland Kitchen - I notified Dr. Selina Cooley about the patient. - The patient will be placed on statin therapy and fasting lipids will be checked.

## 2023-01-01 NOTE — Assessment & Plan Note (Signed)
-   We will continue statin therapy and check fasting lipids. 

## 2023-01-01 NOTE — TOC Progression Note (Signed)
Transition of Care [facility]) - Progression Note    Patient Details  Name: [name] Gilbert MRN: [id number] Date of Birth: 1961-06-09  Transition of Care [facility]) CM/SW Contact  Garret Reddish, RN Phone Number: 01/01/2023, 10:56 AM  Clinical Narrative:   Received message from [facility] with [facility].  She informs me that patient is active with [facility].  She reports that patient was active with skilled nursing, PT and Child psychotherapist.      Expected Discharge Plan: Skilled Nursing Facility (Pending Medical work up) Barriers to Discharge: No Barriers Identified  Expected Discharge Plan and Services   Discharge Planning Services: CM Consult   Living arrangements for the past 2 months: Single Family Home                                       Social Determinants of Health (SDOH) Interventions SDOH Screenings   Food Insecurity: No Food Insecurity (01/01/2023)  Housing: Low Risk  (01/01/2023)  Transportation Needs: No Transportation Needs (01/01/2023)  Utilities: Not At Risk (01/01/2023)  Tobacco Use: Low Risk  (01/01/2023)    Readmission Risk Interventions     No data to display

## 2023-01-01 NOTE — Assessment & Plan Note (Addendum)
-   We will continue PPI therapy 

## 2023-01-01 NOTE — Evaluation (Signed)
Physical Therapy Evaluation Patient Details Name: [name] Gilbert MRN: [id number] DOB: Sep 26, 1960 Today's Date: 01/01/2023  History of Present Illness  [name] Gilbert is a 62 y.o. African-American female with medical history significant for diabetes mellitus, with stage IIIb chronic kidney disease, hypertension and CVA, & stroke, who presented to the emergency room with acute onset of transient altered mental status with confusion and expressive aphasia.   Clinical Impression  Patient alert, able to report her name, what town she lives in, and with extra time oriented to "hospital". Per chart at baseline pt needs physical assistance for mobility(transfers to [facility]) and has intermittent supervision. Upon assessment the patient was able to move all extremities against gravity. MaxA to sit EOB, pt able to initiate  but ultimately needed significant assistance. Sit <> stand with OT performed twice, pt unable to come up into fully standing and needed min-modAx2 to complete. Unable to side step or pivot to recliner and noted for inability to move LLE, knee locked in extension.  Overall the patient demonstrated deficits (see "PT Problem List") that impede the patient's functional abilities, safety, and mobility and would benefit from skilled PT intervention. Recommendation is continued skilled PT intervention to maximize safety, function, and mobility.      Recommendations for follow up therapy are one component of a multi-disciplinary discharge planning process, led by the attending physician.  Recommendations may be updated based on patient status, additional functional criteria and insurance authorization.  Follow Up Recommendations Can patient physically be transported by private vehicle: No     Assistance Recommended at Discharge Frequent or constant Supervision/Assistance  Patient can return home with the following  Two people to help with walking and/or transfers;Assistance with  cooking/housework;Assist for transportation;Help with stairs or ramp for entrance;Assistance with feeding;Two people to help with bathing/dressing/bathroom    Equipment Recommendations None recommended by PT  Recommendations for Other Services       Functional Status Assessment Patient has had a recent decline in their functional status and demonstrates the ability to make significant improvements in function in a reasonable and predictable amount of time.     Precautions / Restrictions Precautions Precautions: Fall Restrictions Weight Bearing Restrictions: No      Mobility  Bed Mobility Overal bed mobility: Needs Assistance Bed Mobility: Supine to Sit, Sit to Supine     Supine to sit: Max assist Sit to supine: Max assist, +2 for physical assistance        Transfers Overall transfer level: Needs assistance Equipment used: Rolling walker (2 wheels) Transfers: Sit to/from Stand Sit to Stand: Min assist, Mod assist, +2 physical assistance           General transfer comment: MIN/MOD +2 to initiate stand, however unable to come to full stand or take steps this session.    Ambulation/Gait               General Gait Details: unable  Stairs            Wheelchair Mobility    Modified Rankin (Stroke Patients Only)       Balance Overall balance assessment: Needs assistance Sitting-balance support: Feet supported Sitting balance-Leahy Scale: Poor       Standing balance-Leahy Scale: Zero Standing balance comment: Unable to stand without MAX A and posterior leg support on bed.                             Pertinent Vitals/Pain Pain Assessment Pain  Assessment: No/denies pain    Home Living Family/patient expects to be discharged to:: Private residence Living Arrangements: Spouse/significant other Available Help at Discharge: Family;Available PRN/intermittently Type of Home: House Home Access: Ramped entrance       Home Layout: One  level Home Equipment: Agricultural consultant (2 wheels);Wheelchair - manual      Prior Function Prior Level of Function : Needs assist             Mobility Comments: Per pt/chart pt requires assist for functional transfers to [facility] at baseline. Does not ambulate. Has WC and RW at home. ADLs Comments: spouse gives bed bath, dresses, assists with toileting, does all IADLs.     Hand Dominance   Dominant Hand: Right    Extremity/Trunk Assessment   Upper Extremity Assessment Upper Extremity Assessment: Defer to OT evaluation    Lower Extremity Assessment Lower Extremity Assessment: Generalized weakness (able to lift BLE off bed with assessment but then with weight bearing noted for inability to move LLE, locked in extension)    Cervical / Trunk Assessment Cervical / Trunk Assessment: Normal  Communication   Communication: Expressive difficulties  Cognition Arousal/Alertness: Awake/alert Behavior During Therapy: Flat affect, WFL for tasks assessed/performed Overall Cognitive Status: No family/caregiver present to determine baseline cognitive functioning                                 General Comments: Requires increased time/cueing to perform multi-step VCs. Oriented to self and limited place (able to [facility] with cueing/choice limiting). Slow processing/initiation of tasks. SLP following for cognition.        General Comments      Exercises     Assessment/Plan    PT Assessment Patient needs continued PT services  PT Problem List Decreased strength;Decreased mobility;Decreased range of motion;Decreased activity tolerance;Decreased balance       PT Treatment Interventions DME instruction;Therapeutic activities;Gait training;Therapeutic exercise;Balance training;Stair training;Functional mobility training;Neuromuscular re-education    PT Goals (Current goals can be found in the Care Plan section)  Acute Rehab PT Goals Patient Stated Goal: to go to  rehab PT Goal Formulation: With family Time For Goal Achievement: 01/15/23 Potential to Achieve Goals: Fair    Frequency Min 2X/week     Co-evaluation PT/OT/SLP Co-Evaluation/Treatment: Yes Reason for Co-Treatment: Necessary to address cognition/behavior during functional activity;For patient/therapist safety;To address functional/ADL transfers PT goals addressed during session: Mobility/safety with mobility;Balance OT goals addressed during session: ADL's and self-care;Proper use of Adaptive equipment and DME       AM-PAC PT "6 Clicks" Mobility  Outcome Measure Help needed turning from your back to your side while in a flat bed without using bedrails?: A Lot Help needed moving from lying on your back to sitting on the side of a flat bed without using bedrails?: A Lot Help needed moving to and from a bed to a chair (including a wheelchair)?: A Lot Help needed standing up from a chair using your arms (e.g., wheelchair or bedside chair)?: A Lot Help needed to walk in hospital room?: Total Help needed climbing 3-5 steps with a railing? : Total 6 Click Score: 10    End of Session Equipment Utilized During Treatment: Gait belt Activity Tolerance: Patient tolerated treatment well Patient left: in bed;with call bell/phone within reach;with bed alarm set Nurse Communication: Mobility status PT Visit Diagnosis: Other abnormalities of gait and mobility (R26.89);Muscle weakness (generalized) (M62.81)    Time: 1610-9604 PT Time  Calculation (min) (ACUTE ONLY): 22 min   Charges:   PT Evaluation $PT Eval Low Complexity: 1 Low PT Treatments $Therapeutic Activity: 8-22 mins        Olga Coaster PT, DPT 1:17 PM,01/01/23

## 2023-01-01 NOTE — Evaluation (Signed)
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   Advanced care planning/counseling discussion 02/02/2022   Left thalamic infarction 02/02/2022   Hyperkalemia 02/01/2022   Closed fracture of right distal fibula    Stroke 01/25/2022   HLD (hyperlipidemia) 01/25/2022   Type II diabetes mellitus with renal manifestations 01/25/2022   Stage 3b chronic kidney disease (CKD) 01/25/2022   Chronic diastolic CHF (congestive heart failure) 01/25/2022   Hypertensive emergency 01/25/2022   Elevated troponin 01/25/2022   Fall    Neurologic abnormality 06/13/2021   Hypoglycemia 06/13/2021   Hypothermia 06/13/2021   Dehydration 06/12/2021   Headache 06/12/2021   Abdominal pain 06/12/2021   Hypoglycemia associated with diabetes 06/12/2021   Acute respiratory failure with hypoxia    HHNC (hyperglycemic hyperosmolar nonketotic coma) 10/01/2019   HTN (hypertension) 11/01/2018   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 African-American female with medical history significant for type diabetes mellitus with  stage IIIb chronic kidney disease, hypertension and CVA, who presented to the emergency room with acute onset of transient altered mental status with confusion and expressive aphasia that started around 10 PM and lasted a few minutes.  She was admitted in February and discharged on 2/14 after being managed for acute stroke with mixed hemorrhagic and ischemic component.  No new focal muscle weakness.    She then went to short-term rehabitation for a couple weeks and later returned home.  She is conversant at baseline and interactive but is not able to walk.  Her confusion and aphasia tonight started at 10 PM and her husband stated that her last time she was normal was at 7 PM.  She was watching a movie when her symptoms started and started asking unusual questions to her husband that she described weird. She is weak on the right side with right upper extremity contractures.  She also states that she had stroke affecting the left side as well.  She denies any headache or dizziness or blurred vision.  No chest pain or palpitations.  No fever or chills.  She admits to mild vertigo without tinnitus.  No weakness seizures.  No urinary or stool incontinence." MRI 01/01/23 "1. Small foci of acute ischemia within the right frontal corona  radiata and the inferior margin of the right lentiform nucleus. No  hemorrhage or mass effect.  2. Old bilateral PCA territory infarcts, left-greater-than-right." CTA 01/01/23 "1. No emergent large vessel occlusion.  2. Short segment occlusion of the left anterior cerebral artery A2  segment, which is patent distally. Unchanged compared to 10/15/2022.  3. 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  4. Occlusion of the left PCA, unchanged compared to 10/15/2022.  5. No hemodynamically significant stenosis of the neck."   Assessment / Plan / Recommendation Clinical Impression  Pt seen for cognitive-linguistic evaluation. Evaluation completed  via informal means. presents with cognitive-linguistic deficits affecting orientation, attention, memory, problem solving/insight, safety awareness, and pragmatics. Noted pt with hx of cognitive-linguistic deficits documented most recently in February 2024; ?baseline level of functioning. No family present to provide collateral.  Pt's significant attentional deficits affecting all other domains of cognition and language. Pt with difficulty attending to task to answer basic/environmental yes/no questions and follow 1-step commands. Pt's speech is often limited to single words with reduced vocal loudness and mildly articulatory imprecision. ?R facial droop appreciated at rest. Pt unable to follow commands for OME. Pt with reduced speech initiation, topic maintenance, and eye contact. Flat affect appreciated. SLP to continue to f/u per POC for functional cognitive-linguistic tx, as appropriate.    SLP Assessment  SLP Recommendation/Assessment: Patient needs continued Speech Lanaguage Pathology Services SLP Visit Diagnosis: Attention and concentration deficit;Cognitive communication deficit (R41.841)    Recommendations for follow up therapy are one component of a multi-disciplinary discharge planning process, led by the attending physician.  Recommendations may be updated based on patient status, additional functional criteria and insurance authorization.    Follow Up Recommendations  Skilled nursing-short term rehab (<3 hours/day)    Assistance Recommended at Discharge  Frequent or constant Supervision/Assistance  Functional Status Assessment Patient has had a recent decline in their functional status and demonstrates the ability to make significant improvements in function in a reasonable and predictable amount of time.  Frequency and Duration min 2x/week  2 weeks      SLP Evaluation Cognition  Overall Cognitive Status: No family/caregiver present to determine baseline cognitive  functioning Arousal/Alertness: Awake/alert Orientation Level: Oriented to person;Disoriented to place;Disoriented to time;Disoriented to situation Memory: Impaired Memory Impairment: Storage deficit;Retrieval deficit Awareness: Impaired Awareness Impairment: Intellectual impairment Problem Solving: Impaired Problem Solving Impairment: Verbal basic Executive Function: Reasoning;Sequencing;Decision Making Reasoning: Impaired Reasoning Impairment: Verbal basic Sequencing: Impaired Sequencing Impairment: Verbal basic Decision Making: Impaired Decision Making Impairment: Verbal basic Safety/Judgment: Impaired       Comprehension  Auditory Comprehension Overall Auditory Comprehension: Impaired Yes/No Questions: Impaired Basic Biographical Questions: 26-50% accurate Basic Immediate Environment Questions: 25-49% accurate Commands: Impaired One Step Basic Commands: 25-49% accurate Conversation: Simple Interfering Components: Attention    Expression Expression Primary Mode of Expression: Verbal Verbal Expression Overall Verbal Expression: Impaired Initiation: Impaired Automatic Speech: Name Level of Generative/Spontaneous Verbalization: Word Repetition: Impaired Level of Impairment: Word level Naming: Impairment (unable to name animals during generative naming task) Pragmatics: Impairment Impairments: Abnormal affect;Eye contact;Topic maintenance Interfering Components: Attention Written Expression Dominant Hand: Right Written Expression: Not tested   Oral / Motor  Oral Motor/Sensory Function Overall Oral Motor/Sensory Function: Mild impairment Facial ROM: Reduced right (at rest; did not follow commands for oral motor exam) Motor Speech Overall Motor Speech: Impaired Phonation: Low vocal intensity Articulation: Impaired Level of Impairment: Word Intelligibility: Intelligibility reduced Word: 75-100% accurate Phrase: 75-100% accurate           Clyde Canterbury, M.S.,  CCC-SLP Speech-Language Pathologist [facility] [phone] Arnette Felts)  Woodroe Chen 01/01/2023, 9:46 AM

## 2023-01-01 NOTE — Assessment & Plan Note (Signed)
-   We will continue her antihypertensives with permissive parameters. 

## 2023-01-01 NOTE — Progress Notes (Signed)
Daughter at bedside. Updated on POC

## 2023-01-01 NOTE — TOC Initial Note (Signed)
Transition of Care [facility]) - Initial/Assessment Note    Patient Details  Name: [name] Gilbert MRN: [id number] Date of Birth: 10/29/1960  Transition of Care [facility]) CM/SW Contact:    Garret Reddish, RN Phone Number: 01/01/2023, 10:34 AM  Clinical Narrative:  Chart reviewed.  Noted that patient was admitted with TIA rule out of evolving CVA.  Noted orders for Neurology Consult, PT, OT, and Speech evaluations.    I have meet with patient at bedside today.  She reports that she lives at home with her husband.  She reports that her husband and daughter was assisted with her care at home.  Patient was very slow to respond today. She has given me permission to reach out to her husband.  I have spoken to Mr. [name].  Mr. Day reports that prior to admission he and patient's daughter where providing care for [name] Gilbert at home. He reports that he and patient's daughter would assist with bathing and dressing.  He stated that patient was not able to walk.  He reports that patient has a Biomedical scientist and a standard walker at home. He spoke to patient about getting a hospital bed in the home and patient declined.  He reports that at [facility] provides in home therapy.        Mr. Day reports that he works and patient's daughter stays with her during the day.  Patient's PCP is Dr. Penni Bombard.  Mr. Day reports that medications are affordable.  Patient uses [facility] in [place] on [address] for prescription medication.    Mr. Day reports that he would like for patient to receive rehab prior to returning home.   [name] continues to work and he is off of work 2 days a week.  He reports that patient has been to [facility] re[facility] before.  He would prefer to go to a facility in [place] as he reports that is easier for him and patient's daughter to be able to visit [name] Gilbert if she is in [place] area. I have explained the placement process.  I have also informed Mr.  Day that once recommendations are received from all therapies that TOC would start the placement process.  [name] is agreeable to a bed search in the [place] area.  I have also informed Mr. Day that patient insurance would also have to approve a SNF stay.    TOC will continue to follow for discharge planning.     Expected Discharge Plan: Skilled Nursing Facility (Pending Medical work up) Barriers to Discharge: No Barriers Identified   Patient Goals and CMS Choice            Expected Discharge Plan and Services   Discharge Planning Services: CM Consult   Living arrangements for the past 2 months: Single Family Home                                      Prior Living Arrangements/Services Living arrangements for the past 2 months: Single Family Home Lives with:: Spouse, Adult Children Patient language and need for interpreter reviewed:: Yes Do you feel safe going back to the place where you live?: No      Need for Family Participation in Patient Care: Yes (Comment) Care giver support system in place?: Yes (comment) (Patient has a supportive daughter and spouse) Current home services: Home PT, DME (Patient has  a Wheelchair and standard walker at home.  Patient is active with [facility] for Home Health PT)    Activities of Daily Living Home Assistive Devices/Equipment: Wheelchair, Blood pressure cuff, CBG Meter ADL Screening (condition at time of admission) Patient's cognitive ability adequate to safely complete daily activities?: Yes Is the patient deaf or have difficulty hearing?: No Does the patient have difficulty seeing, even when wearing glasses/contacts?: No Does the patient have difficulty concentrating, remembering, or making decisions?: Yes Patient able to express need for assistance with ADLs?: Yes Does the patient have difficulty dressing or bathing?: Yes Independently performs ADLs?: No Communication: Independent Dressing (OT): Dependent Is this a  change from baseline?: Pre-admission baseline Grooming: Dependent Is this a change from baseline?: Pre-admission baseline Feeding: Needs assistance Is this a change from baseline?: Pre-admission baseline Bathing: Dependent Is this a change from baseline?: Pre-admission baseline Toileting: Dependent Is this a change from baseline?: Pre-admission baseline In/Out Bed: Dependent Is this a change from baseline?: Pre-admission baseline Walks in Home: Dependent Is this a change from baseline?: Pre-admission baseline Does the patient have difficulty walking or climbing stairs?: Yes Weakness of Legs: Both Weakness of Arms/Hands: Both  Permission Sought/Granted                  Emotional Assessment Appearance:: Appears stated age Attitude/Demeanor/Rapport: Engaged (Patient very slow to respond) Affect (typically observed): Appropriate Orientation: : Oriented to Self, Oriented to Place      Admission diagnosis:  TIA (transient ischemic attack) [G45.9] Confusion [R41.0] Altered mental status, unspecified altered mental status type [R41.82] Patient Active Problem List   Diagnosis Date Noted   TIA (transient ischemic attack) 01/01/2023   Essential hypertension 01/01/2023   Seizure disorder 01/01/2023   Dyslipidemia 01/01/2023   GERD without esophagitis 01/01/2023   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   Advanced care planning/counseling discussion 02/02/2022   Left thalamic infarction 02/02/2022   Hyperkalemia 02/01/2022   Closed fracture of right distal fibula    Stroke 01/25/2022   HLD (hyperlipidemia) 01/25/2022   Type II diabetes mellitus with renal manifestations 01/25/2022   Stage 3b chronic kidney disease (CKD) 01/25/2022   Chronic diastolic CHF (congestive heart failure) 01/25/2022   Hypertensive emergency 01/25/2022   Elevated troponin 01/25/2022   Fall    Neurologic abnormality 06/13/2021   Hypoglycemia 06/13/2021   Hypothermia 06/13/2021   Dehydration 06/12/2021    Headache 06/12/2021   Abdominal pain 06/12/2021   Hypoglycemia associated with diabetes 06/12/2021   Acute respiratory failure with hypoxia    HHNC (hyperglycemic hyperosmolar nonketotic coma) 10/01/2019   HTN (hypertension) 11/01/2018   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   PCP:  Tobey Grim, MD Pharmacy:   [facility] [phone] [place], [place] - [address] AT [facility] OF [address] [place] [address] Phone: [phone] Fax: [phone]  [facility] #[id number] [place], [place] - [address] AT [facility] OF [address] [place] [address] Phone: [phone] Fax: [phone]     Social Determinants of Health (SDOH) Social History: SDOH Screenings   Food Insecurity: No Food Insecurity (01/01/2023)  Housing: Low Risk  (01/01/2023)  Transportation Needs: No Transportation Needs (01/01/2023)  Utilities: Not At Risk (01/01/2023)  Tobacco Use: Low Risk  (01/01/2023)   SDOH Interventions:  Readmission Risk Interventions     No data to display

## 2023-01-01 NOTE — Progress Notes (Signed)
[facility] Progress Note  Patient: [name] Gilbert    ZOX:[id number]  DOA: 12/31/2022     Date of Service: the patient was seen and examined on 01/01/2023  Chief Complaint  Patient presents with   Altered Mental Status   Brief hospital course: [name] Gilbert is a 62 y.o. African-American female with PMH of IDDM T w/CKD stage IIIb, Hypertension and CVA, who presented to the emergency room with acute onset of transient altered mental status with confusion and expressive aphasia that started around 10 PM and lasted a few minutes.  She was admitted in February and discharged on 2/14 after being managed for acute stroke with mixed hemorrhagic and ischemic component.   ED workup: VS stable,  BMP blood glucose 124, BUN 24, creatinine 1.8, CBC WNL EKG as reviewed by me : EKG showed normal sinus rhythm with a rate of 100 and right axis deviation. Imaging: Portable x-ray showed no acute cardiopulmonary disease. Neurology was consulted, patient was admitted for further management as below.  Assessment and Plan:  # Acute stroke MRI Brain:  Small foci of acute ischemia within the right frontal corona radiata and the inferior margin of the right lentiform nucleus. No hemorrhage or mass effect. Old bilateral PCA territory infarcts, left-greater-than-right. CTA H/N: No emergent large vessel occlusion. Short segment occlusion of the left anterior cerebral artery A2 segment, which is patent distally. Unchanged compared to 10/15/2022. 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 Occlusion of the left PCA, unchanged compared to 10/15/2022. No hemodynamically significant stenosis of the neck. Neurology consulted, recommended dual antiplatelet therapy for 21 days given recent hemorrhage.  Started aspirin 81 mg p.o. daily and Plavix 75 mg p.o. daily for 21 days followed by Plavix only 75 mg p.o. daily Started Crestor 20 mg p.o. 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 LDL 169, goal <70 10/15/2022 TTE shows LVEF 665%, grade 1 diastolic dysfunction.  No need to repeat as per neurology Allow permissive hypertension for 48 hours, goal BP 180/105 Follow A1c and TSH level Follow PT and OT eval for possible SNF placement   # HTN, HLD Started Crestor 20 mg p.o. daily Held amlodipine and hydralazine for 48 hours to allow permissive hypertension Use IV hydralazine as needed Monitor BP and titrate medication accordingly  # History of seizures, continue Keppra to 50 mg p.o. twice daily home dose  # IDDM T2, held home regimen for now Started Semglee 10 units nightly, NovoLog sliding scale, monitor FSBG, continue diabetic diet  # Hypokalemia, potassium repleted. Monitor electrolytes and replete as needed.  # CKD stage IIIb Stable, continue to monitor creatinine level daily  # GERD on PPI    Body mass index is 30.56 kg/m.  Nutrition Problem: Inadequate oral intake Etiology: decreased appetite Interventions: Interventions: Ensure Enlive (each supplement provides 350kcal and 20 grams of protein), MVI, Liberalize Diet     Diet: Diabetic diet DVT Prophylaxis: Subcutaneous Lovenox   Advance goals of care discussion: DNR  Family Communication: family was not present at bedside, at the time of interview.  The pt provided permission to discuss medical plan with the family. Opportunity was given to ask question and all questions were answered satisfactorily.   Disposition:  Pt is from Home, admitted with Acute CVA, will need SNF placement, which precludes a safe discharge. Discharge to SNF, when bed will be available, TOC is follwoing.  Subjective: No significant events overnight, patient has AAO x 1, unable to offer  any complaints due to dementia and aphasia.  Patient was resting comfortably.  Physical Exam: General: NAD, lying comfortably Appear in no distress, affect appropriate Eyes: PERRLA ENT: Oral Mucosa Clear, moist   Neck: no JVD,  Cardiovascular: S1 and S2 Present, no Murmur,  Respiratory: good respiratory effort, Bilateral Air entry equal and Decreased, no Crackles, no wheezes Abdomen: Bowel Sound present, Soft and no tenderness,  Skin: no rashes Extremities: no Pedal edema, no calf tenderness Neurologic: Aphasia, AO x 1, residual weakness on the right side Gait not checked due to patient safety concerns  Vitals:   01/01/23 0538 01/01/23 0843 01/01/23 0849 01/01/23 1300  BP: (!) 149/82 (!) 155/78  (!) 148/78  Pulse: 86 (!) 113 92 90  Resp: 20 18  20   Temp: 98.7 F (37.1 C) 97.9 F (36.6 C)  98 F (36.7 C)  TempSrc:      SpO2: 100% 100%  100%  Weight:        Intake/Output Summary (Last 24 hours) at 01/01/2023 1501 Last data filed at 01/01/2023 1020 Gross per 24 hour  Intake 0 ml  Output --  Net 0 ml   Filed Weights   01/01/23 0130  Weight: 75.8 kg    Data Reviewed: I have personally reviewed and interpreted daily labs, tele strips, imagings as discussed above. I reviewed all nursing notes, pharmacy notes, vitals, pertinent old records I have discussed plan of care as described above with RN and patient/family.  CBC: Recent Labs  Lab 12/31/22 2350 01/01/23 0557  WBC 6.4 5.5  NEUTROABS 2.8  --   HGB 13.1 13.2  HCT 40.1 39.1  MCV 86.4 85.2  PLT 300 282   Basic Metabolic Panel: Recent Labs  Lab 12/31/22 2350 01/01/23 0552 01/01/23 0557  NA 138  --  140  K 3.7  --  3.2*  CL 103  --  105  CO2 26  --  25  GLUCOSE 124*  --  121*  BUN 24*  --  20  CREATININE 1.80*  --  1.63*  CALCIUM 9.1  --  9.0  MG  --  1.8  --   PHOS  --  3.0  --     Studies: MR BRAIN WO CONTRAST  Result Date: 01/01/2023 CLINICAL DATA:  Transient ischemic attack EXAM: MRI HEAD WITHOUT CONTRAST TECHNIQUE: Multiplanar, multiecho pulse sequences of the brain and surrounding structures were obtained without intravenous contrast. COMPARISON:  10/15/2022 FINDINGS: Brain: Small foci of acute ischemia  within the right frontal corona radiata and the inferior margin of the right lentiform nucleus. Old bilateral PCA territory infarcts, left-greater-than-right. Chronic microhemorrhage within the central pons. There is confluent hyperintense T2-weighted signal within the white matter. Generalized volume loss. The midline structures are normal. Vascular: Major flow voids are preserved. Skull and upper cervical spine: Normal calvarium and skull base. Visualized upper cervical spine and soft tissues are normal. Sinuses/Orbits:No paranasal sinus fluid levels or advanced mucosal thickening. No mastoid or middle ear effusion. Normal orbits. IMPRESSION: 1. Small foci of acute ischemia within the right frontal corona radiata and the inferior margin of the right lentiform nucleus. No hemorrhage or mass effect. 2. Old bilateral PCA territory infarcts, left-greater-than-right. Electronically Signed   By: Deatra Robinson M.D.   On: 01/01/2023 03:25   CT ANGIO HEAD NECK W WO CM  Result Date: 01/01/2023 CLINICAL DATA:  Transient ischemic attack EXAM: CT ANGIOGRAPHY HEAD AND NECK WITH AND WITHOUT CONTRAST TECHNIQUE: Multidetector CT imaging of the head and neck  was performed using the standard protocol during bolus administration of intravenous contrast. Multiplanar CT image reconstructions and MIPs were obtained to evaluate the vascular anatomy. Carotid stenosis measurements (when applicable) are obtained utilizing NASCET criteria, using the distal internal carotid diameter as the denominator. RADIATION DOSE REDUCTION: This exam was performed according to the departmental dose-optimization program which includes automated exposure control, adjustment of the mA and/or kV according to patient size and/or use of iterative reconstruction technique. CONTRAST:  60mL OMNIPAQUE IOHEXOL 350 MG/ML SOLN COMPARISON:  10/15/2022 MRA head FINDINGS: CTA NECK FINDINGS SKELETON: There is no bony spinal canal stenosis. No lytic or blastic lesion.  OTHER NECK: Normal pharynx, larynx and major salivary glands. No cervical lymphadenopathy. Unremarkable thyroid gland. UPPER CHEST: No pneumothorax or pleural effusion. No nodules or masses. AORTIC ARCH: There is no calcific atherosclerosis of the aortic arch. There is no aneurysm, dissection or hemodynamically significant stenosis of the visualized portion of the aorta. Conventional 3 vessel aortic branching pattern. The visualized proximal subclavian arteries are widely patent. RIGHT CAROTID SYSTEM: Normal without aneurysm, dissection or stenosis. LEFT CAROTID SYSTEM: Normal without aneurysm, dissection or stenosis. VERTEBRAL ARTERIES: Left dominant configuration. Both origins are clearly patent. There is no dissection, occlusion or flow-limiting stenosis to the skull base (V1-V3 segments). CTA HEAD FINDINGS POSTERIOR CIRCULATION: --Vertebral arteries: Normal V4 segments. --Inferior cerebellar arteries: Normal. --Basilar artery: Normal. --Superior cerebellar arteries: Normal. 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 Left PCA is occluded, unchanged on 10/15/2022. ANTERIOR CIRCULATION: --Intracranial internal carotid arteries: Normal. --Anterior cerebral arteries (ACA): Short segment occlusion of the left A2 segment, which is patent distally. Unchanged compared to 10/15/2022. 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 VENOUS SINUSES: As permitted by contrast timing, patent. ANATOMIC VARIANTS: None Review of the MIP images confirms the above findings. IMPRESSION: 1. No emergent large vessel occlusion. 2. Short segment occlusion of the left anterior cerebral artery A2 segment, which is patent distally. Unchanged compared to 10/15/2022. 3. 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 4. Occlusion of the left PCA, unchanged compared to 10/15/2022. 5. No  hemodynamically significant stenosis of the neck. Electronically Signed   By: Deatra Robinson M.D.   On: 01/01/2023 02:56   DG Chest Portable 1 View  Result Date: 01/01/2023 CLINICAL DATA:  Altered mental status. EXAM: PORTABLE CHEST 1 VIEW COMPARISON:  October 14, 2022 FINDINGS: The heart size and mediastinal contours are within normal limits. Both lungs are clear. The visualized skeletal structures are unremarkable. IMPRESSION: No active disease. Electronically Signed   By: Aram Candela M.D.   On: 01/01/2023 00:38   CT HEAD CODE STROKE WO CONTRAST  Result Date: 12/31/2022 CLINICAL DATA:  Code stroke.  Confusion and aphasia EXAM: CT HEAD WITHOUT CONTRAST TECHNIQUE: Contiguous axial images were obtained from the base of the skull through the vertex without intravenous contrast. RADIATION DOSE REDUCTION: This exam was performed according to the departmental dose-optimization program which includes automated exposure control, adjustment of the mA and/or kV according to patient size and/or use of iterative reconstruction technique. COMPARISON:  None Available. FINDINGS: Brain: There is no mass, hemorrhage or extra-axial collection. The size and configuration of the ventricles and extra-axial CSF spaces are normal. There is hypoattenuation of the periventricular white matter, most commonly indicating chronic ischemic microangiopathy. Large, old left PCA territory infarct. Small old right occipital infarct. Vascular: No abnormal hyperdensity of the major intracranial arteries or dural venous sinuses. No intracranial  atherosclerosis. Skull: The visualized skull base, calvarium and extracranial soft tissues are normal. Sinuses/Orbits: No fluid levels or advanced mucosal thickening of the visualized paranasal sinuses. No mastoid or middle ear effusion. The orbits are normal. ASPECTS [facility] Stroke Program Early CT Score) - Ganglionic level infarction (caudate, lentiform nuclei, internal capsule, insula, M1-M3  cortex): 7 - Supraganglionic infarction (M4-M6 cortex): 3 Total score (0-10 with 10 being normal): 10 IMPRESSION: 1. No acute intracranial abnormality. 2. Large, old left PCA territory infarct and small old right occipital infarct. 3. ASPECTS is 10. These results were called by telephone at the time of interpretation on 12/31/2022 at 11:53 pm to provider [facility] , who verbally acknowledged these results. Electronically Signed   By: Deatra Robinson M.D.   On: 12/31/2022 23:53    Scheduled Meds:  [START ON 01/02/2023] amLODipine  10 mg Oral Daily   [START ON 01/02/2023] aspirin EC  81 mg Oral Daily   clopidogrel  75 mg Oral Daily   enoxaparin (LOVENOX) injection  40 mg Subcutaneous Q24H   feeding supplement  237 mL Oral BID BM   [START ON 01/02/2023] hydrALAZINE  25 mg Oral TID   insulin aspart  0-15 Units Subcutaneous TID WC   insulin glargine-yfgn  10 Units Subcutaneous QHS   levETIRAcetam  250 mg Oral BID   multivitamin with minerals  1 tablet Oral Daily   pantoprazole  40 mg Oral QHS   polyethylene glycol  17 g Oral Daily   rosuvastatin  20 mg Oral QHS   senna-docusate  2 tablet Oral QHS   Continuous Infusions:  sodium chloride 75 mL/hr at 01/01/23 0823   PRN Meds: acetaminophen **OR** acetaminophen, magnesium hydroxide, ondansetron **OR** ondansetron (ZOFRAN) IV, traZODone  Time spent: 35 minutes  Author: Gillis Santa. MD [facility] 01/01/2023 3:01 PM  To reach On-call, see care teams to locate the attending and reach out to them via [email or web address]. If 7PM-7AM, please contact night-coverage If you still have difficulty reaching the attending provider, please page the [facility] (Director on Call) for [facility] on amion for assistance.

## 2023-01-01 NOTE — Assessment & Plan Note (Signed)
-   We will continue Keppra. 

## 2023-01-01 NOTE — Evaluation (Signed)
Occupational Therapy Evaluation Patient Details Name: [name] Gilbert MRN: [id number] DOB: 1961-06-27 Today's Date: 01/01/2023   History of Present Illness [name] Gilbert is a 62 y.o. African-American female with medical history significant for diabetes mellitus, with stage IIIb chronic kidney disease, hypertension and CVA, & stroke, who presented to the emergency room with acute onset of transient altered mental status with confusion and expressive aphasia.   Clinical Impression   Ms. [name] Gilbert was seen for OT evaluation this date. Prior to hospital admission, pt was residing at home with her spouse. Per chart, pt spouse and daughter support her with most BADL management. Pt is unable to walk at baseline but can transfer to a WC. Family assist with bathing, dressing, and toileting. Pt presents to acute OT demonstrating impaired ADL performance and functional mobility 2/2 generalized weakness, decreased cognition, decreased activity tolerance (See OT problem list OT problem list for additional functional deficits). Pt currently requires MAX A +2 for bed mobility and STS t/f attempts, MAX A for LB ADL management (including toileting, bathing, and dressing) at bed level.  Pt would benefit from skilled OT services to address noted impairments and functional limitations (see below for any additional details) in order to maximize safety and independence while minimizing falls risk and caregiver burden.     Recommendations for follow up therapy are one component of a multi-disciplinary discharge planning process, led by the attending physician.  Recommendations may be updated based on patient status, additional functional criteria and insurance authorization.   Assistance Recommended at Discharge Frequent or constant Supervision/Assistance  Patient can return home with the following Two people to help with walking and/or transfers;Two people to help with bathing/dressing/bathroom;Assistance with  cooking/housework;Help with stairs or ramp for entrance;Assist for transportation    Functional Status Assessment  Patient has had a recent decline in their functional status and demonstrates the ability to make significant improvements in function in a reasonable and predictable amount of time.  Equipment Recommendations  Other (comment) (defer)    Recommendations for Other Services       Precautions / Restrictions Precautions Precautions: Fall Restrictions Weight Bearing Restrictions: No      Mobility Bed Mobility Overal bed mobility: Needs Assistance Bed Mobility: Supine to Sit, Sit to Supine     Supine to sit: Max assist Sit to supine: Max assist, +2 for physical assistance        Transfers Overall transfer level: Needs assistance Equipment used: Rolling walker (2 wheels) Transfers: Sit to/from Stand Sit to Stand: Min assist, Mod assist, +2 physical assistance           General transfer comment: MIN/MOD +2 to initiate stand, however unable to come to full stand or take steps this session.      Balance Overall balance assessment: Needs assistance Sitting-balance support: Feet supported, Bilateral upper extremity supported Sitting balance-Leahy Scale: Poor Sitting balance - Comments: generally requires at least MIN A to maintain static sitting balance.     Standing balance-Leahy Scale: Zero Standing balance comment: Unable to stand without MAX A and posterior leg support on bed.                           ADL either performed or assessed with clinical judgement   ADL Overall ADL's : Needs assistance/impaired  General ADL Comments: Functionally limited by generalized weakness, decreased activity tolerance, decreased vision, and decreased safety awareness. She requires MAX A for sup>sit, +2 MIN/MOD A for STS, +2 MAX A for sit>sup. Anticipate MAX A for bed level LB ADL management. SET UP assist  with signficant increased time/cueing to perform UB grooming at bed level this date.     Vision Patient Visual Report: No change from baseline Additional Comments: Pt reports vision deficits at baseline from recent stroke. Noted deficits in inferior visual field on R side, will continue to monitor within functional context.     Perception     Praxis      Pertinent Vitals/Pain Pain Assessment Pain Assessment: No/denies pain     Hand Dominance Right   Extremity/Trunk Assessment Upper Extremity Assessment Upper Extremity Assessment: Generalized weakness (globally weak, no focal deficits appreciated in LUE/RUE, decreased AROM of RUE against gravity. suspect baseline limitations from prior stroke.)   Lower Extremity Assessment Lower Extremity Assessment: Generalized weakness;Defer to PT evaluation       Communication Communication Communication: Expressive difficulties   Cognition Arousal/Alertness: Awake/alert Behavior During Therapy: Flat affect, WFL for tasks assessed/performed Overall Cognitive Status: No family/caregiver present to determine baseline cognitive functioning                                 General Comments: Requires increased time/cueing to perform multi-step VCs. Oriented to self and limited place (able to [facility] with cueing/choice limiting). Slow processing/initiation of tasks. SLP following for cognition.     General Comments       Exercises Other Exercises Other Exercises: Pt educated on role of OT in acute setting, safety, falls prevention, & routines modifications to support safety and functional independence during ADL management.   Shoulder Instructions      Home Living Family/patient expects to be discharged to:: Private residence Living Arrangements: Spouse/significant other Available Help at Discharge: Family;Available PRN/intermittently Type of Home: House Home Access: Ramped entrance     Home Layout: One level      Bathroom Shower/Tub: Chief Strategy Officer: Standard Bathroom Accessibility: Yes How Accessible: Accessible via walker Home Equipment: Rolling Walker (2 wheels);Wheelchair - manual      Lives With: Spouse    Prior Functioning/Environment Prior Level of Function : Needs assist             Mobility Comments: Per pt/chart pt requires assist for functional transfers to [facility] at baseline. Does not ambulate. Has WC and RW at home. ADLs Comments: spouse gives bed bath, dresses, assists with toileting, does all IADLs.        OT Problem List:        OT Treatment/Interventions: Self-care/ADL training;Therapeutic exercise;Therapeutic activities;Patient/family education;Balance training;Energy conservation;DME and/or AE instruction    OT Goals(Current goals can be found in the care plan section) Acute Rehab OT Goals Patient Stated Goal: To feel better OT Goal Formulation: With patient Time For Goal Achievement: 01/15/23 Potential to Achieve Goals: Good ADL Goals Pt Will Perform Grooming: sitting;with set-up;with supervision Pt Will Perform Lower Body Dressing: sit to/from stand;with min assist;with adaptive equipment;with caregiver independent in assisting Pt Will Transfer to Toilet: stand pivot transfer;bedside commode;with min assist Pt Will Perform Toileting - Clothing Manipulation and hygiene: sit to/from stand;with min assist;with adaptive equipment;with caregiver independent in assisting  OT Frequency: Min 1X/week    Co-evaluation PT/OT/SLP Co-Evaluation/Treatment: Yes Reason for Co-Treatment: Necessary to address cognition/behavior during  functional activity;For patient/therapist safety;To address functional/ADL transfers PT goals addressed during session: Mobility/safety with mobility;Balance OT goals addressed during session: ADL's and self-care;Proper use of Adaptive equipment and DME      AM-PAC OT "6 Clicks" Daily Activity     Outcome Measure Help from  another person eating meals?: A Little Help from another person taking care of personal grooming?: A Little Help from another person toileting, which includes using toliet, bedpan, or urinal?: Total Help from another person bathing (including washing, rinsing, drying)?: A Lot Help from another person to put on and taking off regular upper body clothing?: A Lot Help from another person to put on and taking off regular lower body clothing?: Total 6 Click Score: 12   End of Session Equipment Utilized During Treatment: Gait belt;Rolling walker (2 wheels)  Activity Tolerance: Patient tolerated treatment well Patient left: in bed;with call bell/phone within reach;with bed alarm set (MD IN room at end ot session.)  OT Visit Diagnosis: Other abnormalities of gait and mobility (R26.89);Muscle weakness (generalized) (M62.81);Hemiplegia and hemiparesis Hemiplegia - Right/Left: Right Hemiplegia - dominant/non-dominant: Dominant Hemiplegia - caused by: Cerebral infarction (remote)                Time: 8119-1478 OT Time Calculation (min): 31 min Charges:  OT General Charges $OT Visit: 1 Visit OT Evaluation $OT Eval Moderate Complexity: 1 Mod OT Treatments $Self Care/Home Management : 8-22 mins  Rockney Ghee, M.S., OTR/L 01/01/23, 12:53 PM

## 2023-01-01 NOTE — Progress Notes (Signed)
Initial Nutrition Assessment  DOCUMENTATION CODES:   Obesity unspecified  INTERVENTION:   -Ensure Enlive po BID, each supplement provides 350 kcal and 20 grams of protein -Magic cup TID with meals, each supplement provides 290 kcal and 9 grams of protein  -MVI with minerals daily -Feeding assistance with meals -Liberalize diet to carb modified for wider variety of meal selections  NUTRITION DIAGNOSIS:   Inadequate oral intake related to decreased appetite as evidenced by meal completion < 25%, per patient/family report.  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    Per neurology, MRI brain reveals small focus of ischemia in rt frontal lobe.   Spoke with pt at bedside. Pt slow to respond and mostly engaged with close ended questions. Pt reports that she has not eaten anything today. PTA, pt reports she has had a poor appetite and often does not feel like eating. Pt unable to provide frequently consumed foods at home or how often she usually eats.   Reviewed wt hx; no wt loss noted over the past 11 months.   Discussed importance of good meal and supplement intake to promote healing. Pt amenable to supplements.   Per nursing staff, pt is requires feeding assistance.   Medications reviewed and include keppra, senokot, and 0.9% sodium chloride infusion @ 75 ml/hr.   Lab Results  Component Value Date   HGBA1C 7.8 (H) 10/15/2022   PTA DM medications are Up to 15 units insulin aspart TID and 10 units tresiba daily.   Labs reviewed:  K: 3.2, CBGS: 194 (inpatient orders for glycemic control are 0-15 units insulin aspart TID with meals and 10 units insulin glargine-yfgn daily).    NUTRITION - FOCUSED PHYSICAL EXAM:  Flowsheet Row Most Recent Value  Orbital Region No depletion  Upper Arm Region Mild depletion  Buccal Region No depletion  Temple Region No depletion  Clavicle Bone Region No depletion  Clavicle and Acromion Bone Region No depletion  Scapular Bone Region No depletion  Dorsal Hand No depletion  Patellar Region Mild depletion  Anterior Thigh Region Mild depletion  Posterior Calf Region Mild depletion  Edema (RD Assessment) None  Hair Reviewed  Eyes Reviewed  Mouth Reviewed  Skin Reviewed  Nails Reviewed       Diet Order:   Diet Order             Diet Heart Room service appropriate? Yes; Fluid consistency: Thin  Diet effective now                   EDUCATION NEEDS:   Education needs have been addressed  Skin:  Skin Assessment: Reviewed RN Assessment  Last BM:  Unknown  Height:   Ht Readings from Last 1 Encounters:  11/21/22  (1.575 m)    Weight:   Wt Readings from Last 1 Encounters:  01/01/23 75.8 kg    Ideal Body Weight:  50 kg  BMI:  Body mass index is 30.56 kg/m.  Estimated Nutritional Needs:   Kcal:  1700-1900  Protein:  90-105  grams  Fluid:  > 1.7 L    Levada Schilling, RD, LDN, CDCES Registered Dietitian II Certified Diabetes Care and Education Specialist Please refer to [facility] for RD and/or RD on-call/weekend/after hours pager

## 2023-01-02 DIAGNOSIS — G459 Transient cerebral ischemic attack, unspecified: Secondary | ICD-10-CM | POA: Diagnosis not present

## 2023-01-02 LAB — CBC
HCT: 38.5 % (ref 36.0–46.0)
Hemoglobin: 12.4 g/dL (ref 12.0–15.0)
MCH: 28.1 pg (ref 26.0–34.0)
MCHC: 32.2 g/dL (ref 30.0–36.0)
MCV: 87.1 fL (ref 80.0–100.0)
Platelets: 189 10*3/uL (ref 150–400)
RBC: 4.42 MIL/uL (ref 3.87–5.11)
RDW: 15.3 % (ref 11.5–15.5)
WBC: 4.9 10*3/uL (ref 4.0–10.5)
nRBC: 0 % (ref 0.0–0.2)

## 2023-01-02 LAB — BASIC METABOLIC PANEL
Anion gap: 7 (ref 5–15)
BUN: 15 mg/dL (ref 8–23)
CO2: 23 mmol/L (ref 22–32)
Calcium: 8.5 mg/dL — ABNORMAL LOW (ref 8.9–10.3)
Chloride: 107 mmol/L (ref 98–111)
Creatinine, Ser: 1.53 mg/dL — ABNORMAL HIGH (ref 0.44–1.00)
GFR, Estimated: 38 mL/min — ABNORMAL LOW (ref >60)
Glucose, Bld: 264 mg/dL — ABNORMAL HIGH (ref 70–99)
Potassium: 4.8 mmol/L (ref 3.5–5.1)
Sodium: 137 mmol/L (ref 135–145)

## 2023-01-02 LAB — GLUCOSE, CAPILLARY
Glucose-Capillary: 242 mg/dL — ABNORMAL HIGH (ref 70–99)
Glucose-Capillary: 236 mg/dL — ABNORMAL HIGH (ref 70–99)
Glucose-Capillary: 138 mg/dL — ABNORMAL HIGH (ref 70–99)
Glucose-Capillary: 303 mg/dL — ABNORMAL HIGH (ref 70–99)

## 2023-01-02 LAB — PHOSPHORUS: Phosphorus: 3.3 mg/dL (ref 2.5–4.6)

## 2023-01-02 LAB — MAGNESIUM: Magnesium: 1.8 mg/dL (ref 1.7–2.4)

## 2023-01-02 MED ORDER — INSULIN GLARGINE-YFGN 100 UNIT/ML ~~LOC~~ SOLN
12.0000 [IU] | Freq: Every day | SUBCUTANEOUS | Status: DC
  Administered 2023-01-02: 12 [IU] via SUBCUTANEOUS
  Filled 2023-01-02 (×2): qty 0.12

## 2023-01-02 MED ORDER — HYDRALAZINE HCL 50 MG PO TABS
25.0000 mg | ORAL_TABLET | Freq: Three times a day (TID) | ORAL | Status: DC
  Administered 2023-01-03 – 2023-01-04 (×4): 25 mg via ORAL
  Filled 2023-01-02 (×4): qty 1

## 2023-01-02 MED ORDER — INSULIN ASPART 100 UNIT/ML IJ SOLN
3.0000 [IU] | Freq: Three times a day (TID) | INTRAMUSCULAR | Status: DC
  Filled 2023-01-02: qty 1

## 2023-01-02 NOTE — Progress Notes (Signed)
[facility] Progress Note  Patient: [name] Gilbert    QMV:[id number]  DOA: 12/31/2022     Date of Service: the patient was seen and examined on 01/02/2023  Chief Complaint  Patient presents with   Altered Mental Status   Brief hospital course: [name] Gilbert is a 62 y.o. African-American female with PMH of IDDM T w/CKD stage IIIb, Hypertension and CVA, who presented to the emergency room with acute onset of transient altered mental status with confusion and expressive aphasia that started around 10 PM and lasted a few minutes.  She was admitted in February and discharged on 2/14 after being managed for acute stroke with mixed hemorrhagic and ischemic component.   ED workup: VS stable,  BMP blood glucose 124, BUN 24, creatinine 1.8, CBC WNL EKG as reviewed by me : EKG showed normal sinus rhythm with a rate of 100 and right axis deviation. Imaging: Portable x-ray showed no acute cardiopulmonary disease. Neurology was consulted, patient was admitted for further management as below.  Assessment and Plan:  # Acute stroke MRI Brain:  Small foci of acute ischemia within the right frontal corona radiata and the inferior margin of the right lentiform nucleus. No hemorrhage or mass effect. Old bilateral PCA territory infarcts, left-greater-than-right. CTA H/N: No emergent large vessel occlusion. Short segment occlusion of the left anterior cerebral artery A2 segment, which is patent distally. Unchanged compared to 10/15/2022. 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 Occlusion of the left PCA, unchanged compared to 10/15/2022. No hemodynamically significant stenosis of the neck. Neurology consulted, recommended dual antiplatelet therapy for 21 days given recent hemorrhage.  Started aspirin 81 mg p.o. daily and Plavix 75 mg p.o. daily for 21 days followed by Plavix only 75 mg p.o. daily Started Crestor 20 mg p.o. 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 LDL 169, goal <70 10/15/2022 TTE shows LVEF 665%, grade 1 diastolic dysfunction.  No need to repeat as per neurology Allow permissive hypertension for 48 hours, goal BP 180/105 A1c 8.7 and TSH level 0.73 PT and OT eval done, TOC following for SNF placement    # HTN, HLD Started Crestor 20 mg p.o. daily Held amlodipine and hydralazine for 48 hours to allow permissive hypertension Use IV hydralazine as needed Monitor BP and titrate medication accordingly  # History of seizures, continue Keppra 250 mg p.o. twice daily home dose  # IDDM T2, held home regimen for now 4/23 increased Semglee 12 units nightly, NovoLog 3 units 3 times daily NovoLog sliding scale, monitor FSBG, continue diabetic diet Diabetic coordinator consulted  # Hypokalemia, potassium repleted. Monitor electrolytes and replete as needed.  # CKD stage IIIb Stable, continue to monitor creatinine level daily Creatinine 1.53  # GERD on PPI    Body mass index is 30.56 kg/m.  Nutrition Problem: Inadequate oral intake Etiology: decreased appetite Interventions: Interventions: Ensure Enlive (each supplement provides 350kcal and 20 grams of protein), MVI, Liberalize Diet     Diet: Diabetic diet DVT Prophylaxis: Subcutaneous Lovenox   Advance goals of care discussion: DNR  Family Communication: family was not present at bedside, at the time of interview.  The pt provided permission to discuss medical plan with the family. Opportunity was given to ask question and all questions were answered satisfactorily.   Disposition:  Pt is from Home, admitted with Acute CVA, will need SNF placement, which precludes a safe discharge. Discharge to SNF, when bed will be available, TOC is follwoing.  Subjective: No significant events overnight, patient has AAO x 1, unable to offer any complaints due to dementia and aphasia.  Patient was resting comfortably.  Physical Exam: General: NAD, lying  comfortably Appear in no distress, affect depressed and confused Eyes: PERRLA ENT: Oral Mucosa Clear, moist  Neck: no JVD,  Cardiovascular: S1 and S2 Present, no Murmur,  Respiratory: good respiratory effort, Bilateral Air entry equal and Decreased, no Crackles, no wheezes Abdomen: Bowel Sound present, Soft and no tenderness,  Skin: no rashes Extremities: no Pedal edema, no calf tenderness Neurologic: Aphasia, AO x 1, residual weakness on the right side Gait not checked due to patient safety concerns  Vitals:   01/01/23 2031 01/01/23 2321 01/02/23 0519 01/02/23 0829  BP: (!) 146/76 (!) 154/79 (!) 180/98 (!) 151/82  Pulse: 88 85 88 64  Resp: Temp: 98.7 F (37.1 C) 98.6 F (37 C) 97.8 F (36.6 C) (!) 97.5 F (36.4 C)  TempSrc:      SpO2: 100% 100% 96% 100%  Weight:        Intake/Output Summary (Last 24 hours) at 01/02/2023 1518 Last data filed at 01/02/2023 1033 Gross per 24 hour  Intake 1808.34 ml  Output --  Net 1808.34 ml   Filed Weights   01/01/23 0130  Weight: 75.8 kg    Data Reviewed: I have personally reviewed and interpreted daily labs, tele strips, imagings as discussed above. I reviewed all nursing notes, pharmacy notes, vitals, pertinent old records I have discussed plan of care as described above with RN and patient/family.  CBC: Recent Labs  Lab 12/31/22 2350 01/01/23 0557 01/02/23 0647  WBC 6.4 5.5 4.9  NEUTROABS 2.8  --   --   HGB 13.1 13.2 12.4  HCT 40.1 39.1 38.5  MCV 86.4 85.2 87.1  PLT 300 282 189   Basic Metabolic Panel: Recent Labs  Lab 12/31/22 2350 01/01/23 0552 01/01/23 0557 01/02/23 0647  NA 138  --  140 137  K 3.7  --  3.2* 4.8  CL 103  --  105 107  CO2 26  --  25 23  GLUCOSE 124*  --  121* 264*  BUN 24*  --  20 15  CREATININE 1.80*  --  1.63* 1.53*  CALCIUM 9.1  --  9.0 8.5*  MG  --  1.8  --  1.8  PHOS  --  3.0  --  3.3    Studies: No results found.  Scheduled Meds:  amLODipine  10 mg Oral Daily    aspirin EC  81 mg Oral Daily   clopidogrel  75 mg Oral Daily   enoxaparin (LOVENOX) injection  40 mg Subcutaneous Q24H   feeding supplement  237 mL Oral BID BM   hydrALAZINE  25 mg Oral TID   insulin aspart  0-15 Units Subcutaneous TID WC   insulin glargine-yfgn  10 Units Subcutaneous QHS   levETIRAcetam  250 mg Oral BID   multivitamin with minerals  1 tablet Oral Daily   pantoprazole  40 mg Oral QHS   polyethylene glycol  17 g Oral Daily   rosuvastatin  20 mg Oral QHS   senna-docusate  2 tablet Oral QHS   Continuous Infusions:  sodium chloride 75 mL/hr at 01/02/23 0636   PRN Meds: acetaminophen **OR** acetaminophen, hydrALAZINE, magnesium hydroxide, ondansetron **OR** ondansetron (ZOFRAN) IV, traZODone  Time spent: 35 minutes  Author: Gillis Santa. MD [facility] 01/02/2023 3:18 PM  To reach On-call, see care  teams to locate the attending and reach out to them via [email or web address]. If 7PM-7AM, please contact night-coverage If you still have difficulty reaching the attending provider, please page the [facility] (Director on Call) for [facility] on amion for assistance.

## 2023-01-02 NOTE — [place] FL2 (Signed)
[place] MEDICAID FL2 LEVEL OF CARE FORM     IDENTIFICATION  Patient Name: [name] Gilbert Birthdate: March 08, 1961 Sex: female Admission Date (Current Location): 12/31/2022  [place] and [place] Number:  Chiropodist and Address:  [facility], [address] [place], [place] [address]      Provider Number: [id number]  Attending Physician Name and Address:  Gillis Santa, MD  Relative Name and Phone Number:  [name] [phone]    Current Level of Care: Hospital Recommended Level of Care: Skilled Nursing Facility Prior Approval Number:    Date Approved/Denied:   PASRR Number: [id number]  Discharge Plan: SNF    Current Diagnoses: Patient Active Problem List   Diagnosis Date Noted   TIA (transient ischemic attack) 01/01/2023   Essential hypertension 01/01/2023   Seizure disorder 01/01/2023   Dyslipidemia 01/01/2023   GERD without esophagitis 01/01/2023   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   Advanced care planning/counseling discussion 02/02/2022   Left thalamic infarction 02/02/2022   Hyperkalemia 02/01/2022   Closed fracture of right distal fibula    Stroke 01/25/2022   HLD (hyperlipidemia) 01/25/2022   Type II diabetes mellitus with renal manifestations 01/25/2022   Stage 3b chronic kidney disease (CKD) 01/25/2022   Chronic diastolic CHF (congestive heart failure) 01/25/2022   Hypertensive emergency 01/25/2022   Elevated troponin 01/25/2022   Fall    Neurologic abnormality 06/13/2021   Hypoglycemia 06/13/2021   Hypothermia 06/13/2021   Dehydration 06/12/2021   Headache 06/12/2021   Abdominal pain 06/12/2021   Hypoglycemia associated with diabetes 06/12/2021   Acute respiratory failure with hypoxia    HHNC (hyperglycemic hyperosmolar nonketotic coma) 10/01/2019   HTN (hypertension) 11/01/2018   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    Orientation RESPIRATION BLADDER Height & Weight     Self, Time  Normal Incontinent Weight: 75.8 kg (From O/V note from 12/07/22) Height:     BEHAVIORAL SYMPTOMS/MOOD NEUROLOGICAL BOWEL NUTRITION STATUS    Convulsions/Seizures Incontinent  (See discharge summary)  AMBULATORY STATUS COMMUNICATION OF NEEDS Skin   Extensive Assist Verbally Normal                       Personal Care Assistance Level of Assistance  Bathing, Feeding, Dressing Bathing Assistance: Maximum assistance Feeding assistance: Limited assistance Dressing Assistance: Maximum assistance     Functional Limitations Info  Sight, Hearing, Speech Sight Info: Impaired Hearing Info: Adequate Speech Info: Impaired    SPECIAL CARE FACTORS FREQUENCY  PT (By licensed PT), OT (By licensed OT)     PT Frequency: 5x weekly OT Frequency: 5x weekly            Contractures Contractures Info: Present (Right upper extrem)    Additional Factors Info  Code Status, Allergies Code Status Info: DNR Allergies Info: Metformin, Penicillins, Codeine, Hydralazine, Statins           Current Medications (01/02/2023):  This is the current hospital active medication list Current Facility-Administered Medications  Medication Dose Route Frequency Provider Last Rate Last Admin   0.9 %  sodium chloride infusion   Intravenous Continuous Gillis Santa, MD 75 mL/hr at 01/02/23 0636 New Bag at 01/02/23 0636   acetaminophen (TYLENOL) tablet 650 mg  650 mg Oral Q6H PRN Mansy, Jan A, MD       Or   acetaminophen (TYLENOL) suppository 650 mg  650 mg  Rectal Q6H PRN Mansy, Jan A, MD       amLODipine (NORVASC) tablet 10 mg  10 mg Oral Daily Gillis Santa, MD   10 mg at 01/02/23 0841   aspirin EC tablet 81 mg  81 mg Oral Daily Gillis Santa, MD   81 mg at 01/02/23 0841   clopidogrel (PLAVIX) tablet 75 mg  75 mg Oral Daily Jefferson Fuel, MD   75 mg at 01/02/23 0841    enoxaparin (LOVENOX) injection 40 mg  40 mg Subcutaneous Q24H Mansy, Jan A, MD   40 mg at 01/02/23 0841   feeding supplement (ENSURE ENLIVE / ENSURE PLUS) liquid 237 mL  237 mL Oral BID BM Gillis Santa, MD   237 mL at 01/01/23 1614   hydrALAZINE (APRESOLINE) injection 5 mg  5 mg Intravenous Q4H PRN Gillis Santa, MD       hydrALAZINE (APRESOLINE) tablet 25 mg  25 mg Oral TID Gillis Santa, MD   25 mg at 01/02/23 0841   insulin aspart (novoLOG) injection 0-15 Units  0-15 Units Subcutaneous TID WC Gillis Santa, MD   5 Units at 01/02/23 0841   insulin glargine-yfgn (SEMGLEE) injection 10 Units  10 Units Subcutaneous QHS Mansy, Jan A, MD   10 Units at 01/01/23 2214   levETIRAcetam (KEPPRA) tablet 250 mg  250 mg Oral BID Mansy, Jan A, MD   250 mg at 01/02/23 8119   magnesium hydroxide (MILK OF MAGNESIA) suspension 30 mL  30 mL Oral Daily PRN Mansy, Jan A, MD       multivitamin with minerals tablet 1 tablet  1 tablet Oral Daily Gillis Santa, MD   1 tablet at 01/02/23 0841   ondansetron (ZOFRAN) tablet 4 mg  4 mg Oral Q6H PRN Mansy, Jan A, MD       Or   ondansetron [facility]) injection 4 mg  4 mg Intravenous Q6H PRN Mansy, Jan A, MD       pantoprazole (PROTONIX) EC tablet 40 mg  40 mg Oral QHS Mansy, Jan A, MD   40 mg at 01/01/23 2213   polyethylene glycol (MIRALAX / GLYCOLAX) packet 17 g  17 g Oral Daily Mansy, Jan A, MD   17 g at 01/02/23 1478   rosuvastatin (CRESTOR) tablet 20 mg  20 mg Oral QHS Mansy, Jan A, MD   20 mg at 01/01/23 2213   senna-docusate (Senokot-S) tablet 2 tablet  2 tablet Oral QHS Mansy, Jan A, MD   2 tablet at 01/01/23 2213   traZODone (DESYREL) tablet 25 mg  25 mg Oral QHS PRN Mansy, Vernetta Honey, MD         Discharge Medications: Please see discharge summary for a list of discharge medications.  Relevant Imaging Results:  Relevant Lab Results:   Additional Information [id number]  Garret Reddish, RN

## 2023-01-02 NOTE — Inpatient Diabetes Management (Signed)
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 Leda Roys with [facility]--Last seen March 2023 Was told to take Tresiba 10 units QHS + Humalog 3 units TID    MD- Note Semglee and Novolog SSI started last PM.  CBGs >200 today  Please consider:  1. Increase Semglee slightly to 12 units QHS  2. Start Novolog Meal Coverage: Novolog 3 units TID with meals HOLD if pt NPO HOLD if pt eats <50% meals    --Will follow patient during hospitalization--  Ambrose Finland RN, MSN, CDCES Diabetes Coordinator Inpatient Glycemic Control Team Team Pager: [phone] (8a-5p)

## 2023-01-02 NOTE — TOC Progression Note (Signed)
Transition of Care [facility]) - Progression Note    Patient Details  Name: [name] Gilbert MRN: [id number] Date of Birth: 11-29-1960  Transition of Care [facility]) CM/SW Contact  Garret Reddish, RN Phone Number: 01/02/2023, 10:43 AM  Clinical Narrative:   FL2 completed.  Bed search sent out to [facility] facilities per patient's husband request.  TOC will continue to follow for discharge planning.      Expected Discharge Plan: Skilled Nursing Facility (Pending Medical work up) Barriers to Discharge: No Barriers Identified  Expected Discharge Plan and Services   Discharge Planning Services: CM Consult   Living arrangements for the past 2 months: Single Family Home                                       Social Determinants of Health (SDOH) Interventions SDOH Screenings   Food Insecurity: No Food Insecurity (01/01/2023)  Housing: Low Risk  (01/01/2023)  Transportation Needs: No Transportation Needs (01/01/2023)  Utilities: Not At Risk (01/01/2023)  Tobacco Use: Low Risk  (01/01/2023)    Readmission Risk Interventions     No data to display

## 2023-01-03 DIAGNOSIS — G459 Transient cerebral ischemic attack, unspecified: Secondary | ICD-10-CM | POA: Diagnosis not present

## 2023-01-03 LAB — BASIC METABOLIC PANEL
Anion gap: 8 (ref 5–15)
BUN: 13 mg/dL (ref 8–23)
CO2: 22 mmol/L (ref 22–32)
Calcium: 8.5 mg/dL — ABNORMAL LOW (ref 8.9–10.3)
Chloride: 110 mmol/L (ref 98–111)
Creatinine, Ser: 1.64 mg/dL — ABNORMAL HIGH (ref 0.44–1.00)
GFR, Estimated: 35 mL/min — ABNORMAL LOW (ref >60)
Glucose, Bld: 92 mg/dL (ref 70–99)
Potassium: 3.5 mmol/L (ref 3.5–5.1)
Sodium: 140 mmol/L (ref 135–145)

## 2023-01-03 LAB — GLUCOSE, CAPILLARY
Glucose-Capillary: 193 mg/dL — ABNORMAL HIGH (ref 70–99)
Glucose-Capillary: 53 mg/dL — ABNORMAL LOW (ref 70–99)
Glucose-Capillary: 77 mg/dL (ref 70–99)
Glucose-Capillary: 82 mg/dL (ref 70–99)
Glucose-Capillary: 197 mg/dL — ABNORMAL HIGH (ref 70–99)

## 2023-01-03 LAB — PHOSPHORUS: Phosphorus: 3.1 mg/dL (ref 2.5–4.6)

## 2023-01-03 LAB — CBC
HCT: 38.3 % (ref 36.0–46.0)
Hemoglobin: 12.2 g/dL (ref 12.0–15.0)
MCH: 28 pg (ref 26.0–34.0)
MCHC: 31.9 g/dL (ref 30.0–36.0)
MCV: 87.8 fL (ref 80.0–100.0)
Platelets: 257 10*3/uL (ref 150–400)
RBC: 4.36 MIL/uL (ref 3.87–5.11)
RDW: 15.4 % (ref 11.5–15.5)
WBC: 4.9 10*3/uL (ref 4.0–10.5)
nRBC: 0 % (ref 0.0–0.2)

## 2023-01-03 LAB — MAGNESIUM: Magnesium: 1.6 mg/dL — ABNORMAL LOW (ref 1.7–2.4)

## 2023-01-03 MED ORDER — INSULIN GLARGINE-YFGN 100 UNIT/ML ~~LOC~~ SOLN
12.0000 [IU] | Freq: Every day | SUBCUTANEOUS | Status: DC
  Administered 2023-01-03: 12 [IU] via SUBCUTANEOUS
  Filled 2023-01-03 (×2): qty 0.12

## 2023-01-03 MED ORDER — MAGNESIUM SULFATE 2 GM/50ML IV SOLN
2.0000 g | Freq: Once | INTRAVENOUS | Status: AC
  Administered 2023-01-03: 2 g via INTRAVENOUS
  Filled 2023-01-03: qty 50

## 2023-01-03 MED ORDER — ROSUVASTATIN CALCIUM 20 MG PO TABS
40.0000 mg | ORAL_TABLET | Freq: Every day | ORAL | Status: DC
  Administered 2023-01-03: 40 mg via ORAL
  Filled 2023-01-03: qty 2

## 2023-01-03 NOTE — Progress Notes (Signed)
Physical Therapy Treatment Patient Details Name: [name] Gilbert MRN: [id number] DOB: 1960/09/30 Today's Date: 01/03/2023   History of Present Illness [name] Gilbert is a 62 y.o. African-American female with medical history significant for diabetes mellitus, with stage IIIb chronic kidney disease, hypertension and CVA, & stroke, who presented to the emergency room with acute onset of transient altered mental status with confusion and expressive aphasia.    PT Comments    Patient alert, but did not speak much to PT/OT during session today. Denied pain. Supine to sit with modA and at end of session maxAx2. She was able to sit EOB for most of session, fair balance and performed a few seated exercises. Sit <> stand three times, min-modAx2 to complete, but unable to take any real steps. The patient would benefit from further skilled PT intervention to continue to progress towards goals.       Recommendations for follow up therapy are one component of a multi-disciplinary discharge planning process, led by the attending physician.  Recommendations may be updated based on patient status, additional functional criteria and insurance authorization.  Follow Up Recommendations  Can patient physically be transported by private vehicle: No    Assistance Recommended at Discharge Frequent or constant Supervision/Assistance  Patient can return home with the following Two people to help with walking and/or transfers;Assistance with cooking/housework;Assist for transportation;Help with stairs or ramp for entrance;Assistance with feeding;Two people to help with bathing/dressing/bathroom   Equipment Recommendations  None recommended by PT    Recommendations for Other Services       Precautions / Restrictions Precautions Precautions: Fall Restrictions Weight Bearing Restrictions: No     Mobility  Bed Mobility Overal bed mobility: Needs Assistance Bed Mobility: Supine to Sit, Sit to Supine      Supine to sit: Mod assist, +2 for safety/equipment Sit to supine: Mod assist, +2 for physical assistance        Transfers Overall transfer level: Needs assistance Equipment used: Rolling walker (2 wheels) Transfers: Sit to/from Stand Sit to Stand: Min assist, +2 physical assistance, Max assist, +2 safety/equipment                Ambulation/Gait               General Gait Details: unable   Stairs             Wheelchair Mobility    Modified Rankin (Stroke Patients Only)       Balance Overall balance assessment: Needs assistance Sitting-balance support: Feet supported, Bilateral upper extremity supported Sitting balance-Leahy Scale: Poor Sitting balance - Comments: supervision-CGA sitting EOB     Standing balance-Leahy Scale: Poor                              Cognition Arousal/Alertness: Awake/alert Behavior During Therapy: Flat affect, WFL for tasks assessed/performed                                            Exercises Other Exercises Other Exercises: doffing and donning of brief in standing, maxAx2 to complete Other Exercises: seated ankle pumps x5, LAQ x5 bilaterally. needed max multimodal cueing to maintain technique. seated reaching x2 to L and R    General Comments        Pertinent Vitals/Pain Pain Assessment Pain Assessment: No/denies pain    Home  Living                          Prior Function            PT Goals (current goals can now be found in the care plan section) Progress towards PT goals: Progressing toward goals    Frequency    Min 2X/week      PT Plan Current plan remains appropriate    Co-evaluation PT/OT/SLP Co-Evaluation/Treatment: Yes Reason for Co-Treatment: Necessary to address cognition/behavior during functional activity;For patient/therapist safety;To address functional/ADL transfers PT goals addressed during session: Mobility/safety with  mobility;Balance OT goals addressed during session: ADL's and self-care;Proper use of Adaptive equipment and DME      AM-PAC PT "6 Clicks" Mobility   Outcome Measure  Help needed turning from your back to your side while in a flat bed without using bedrails?: A Lot Help needed moving from lying on your back to sitting on the side of a flat bed without using bedrails?: A Lot Help needed moving to and from a bed to a chair (including a wheelchair)?: A Lot Help needed standing up from a chair using your arms (e.g., wheelchair or bedside chair)?: A Lot Help needed to walk in hospital room?: Total Help needed climbing 3-5 steps with a railing? : Total 6 Click Score: 10    End of Session Equipment Utilized During Treatment: Gait belt Activity Tolerance: Patient tolerated treatment well Patient left: in bed;with call bell/phone within reach;with bed alarm set Nurse Communication: Mobility status PT Visit Diagnosis: Other abnormalities of gait and mobility (R26.89);Muscle weakness (generalized) (M62.81)     Time: 1610-9604 PT Time Calculation (min) (ACUTE ONLY): 28 min  Charges:  $Therapeutic Activity: 8-22 mins                     Olga Coaster PT, DPT 3:42 PM,01/03/23

## 2023-01-03 NOTE — Inpatient Diabetes Management (Signed)
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  Anticipate Novolog being given over 2 hours and 20 mins after obtaining CBG likely cause of hypoglycemia.   NURSING: Novolog correction insulin should be given within 60 minutes of obtaining CBG. If Novolog is given more than 60 mins from obtaining a CBG then a new CBG would be needed.  Thanks, [name] Penner, RN, MSN, CDCES Diabetes Coordinator Inpatient Diabetes Program [phone] (Team Pager from 8am to 5pm)

## 2023-01-03 NOTE — TOC Progression Note (Signed)
Transition of Care [facility]) - Progression Note    Patient Details  Name: [name] Gilbert MRN: [id number] Date of Birth: [birth year]  [facility] CM/SW Contact  Garret Reddish, RN Phone Number: 01/03/2023, 2:49 PM  Clinical Narrative:   Chart reviewed.  I have spoken with patient's husband Mrs. Day.  I have given him bed offers.  He has chosen [facility].  I have informed Mr. Day that insurance company would have to approve for SNF stay.  I have asked PT/OT to provide updated therapy notes.  I will submit SNF authorization once updated notes are obtained.  Tanya with [facility] can accept patient on tomorrow pending insurance approval.  TOC will continue to follow for discharge planning.      Expected Discharge Plan: Skilled Nursing Facility (Pending Medical work up) Barriers to Discharge: No Barriers Identified  Expected Discharge Plan and Services   Discharge Planning Services: CM Consult   Living arrangements for the past 2 months: Single Family Home                                       Social Determinants of Health (SDOH) Interventions SDOH Screenings   Food Insecurity: No Food Insecurity (01/01/2023)  Housing: Low Risk  (01/01/2023)  Transportation Needs: No Transportation Needs (01/01/2023)  Utilities: Not At Risk (01/01/2023)  Tobacco Use: Low Risk  (01/01/2023)    Readmission Risk Interventions     No data to display

## 2023-01-03 NOTE — Progress Notes (Signed)
Occupational Therapy Treatment Patient Details Name: [name] Gilbert MRN: [id number] DOB: 25-Apr-1961 Today's Date: 01/03/2023   History of present illness [name] Gilbert is a 62 y.o. African-American female with medical history significant for diabetes mellitus, with stage IIIb chronic kidney disease, hypertension and CVA, & stroke, who presented to the emergency room with acute onset of transient altered mental status with confusion and expressive aphasia.   OT comments  Patient agreeable to OT/PT co-treatment to maximize safety and participation. Pt minimally speaking to therapists t/o session. Oriented to self and significant other in room. She required Mod A +2 for bed mobility. Once sitting EOB, pt engaged in seated grooming and UB dressing. Pt completed STS 3x from EOB with Min-Max A +2 using RW. Pt found to have incontinent BM and required Max A for posterior hygiene and to don new underwear. Pt left as received with all needs in reach. Pt is making progress toward goal completion. D/C recommendation remains appropriate. OT will continue to follow acutely.    Recommendations for follow up therapy are one component of a multi-disciplinary discharge planning process, led by the attending physician.  Recommendations may be updated based on patient status, additional functional criteria and insurance authorization.    Assistance Recommended at Discharge Frequent or constant Supervision/Assistance  Patient can return home with the following  Two people to help with walking and/or transfers;Two people to help with bathing/dressing/bathroom;Assistance with cooking/housework;Help with stairs or ramp for entrance;Assist for transportation;Direct supervision/assist for medications management   Equipment Recommendations  Other (comment) (defer to next venue of care)    Recommendations for Other Services      Precautions / Restrictions Precautions Precautions: Fall Restrictions Weight Bearing  Restrictions: No       Mobility Bed Mobility Overal bed mobility: Needs Assistance Bed Mobility: Supine to Sit, Sit to Supine     Supine to sit: Mod assist, +2 for safety/equipment Sit to supine: Mod assist, +2 for physical assistance        Transfers Overall transfer level: Needs assistance Equipment used: Rolling walker (2 wheels) Transfers: Sit to/from Stand Sit to Stand: Min assist, +2 physical assistance, Max assist, +2 safety/equipment           General transfer comment: STS from EOB with Min-Max A +2 (variable levels of assistance required, completed 3x)     Balance Overall balance assessment: Needs assistance Sitting-balance support: Feet supported, Bilateral upper extremity supported Sitting balance-Leahy Scale: Poor Sitting balance - Comments: supervision-CGA sitting EOB     Standing balance-Leahy Scale: Poor         ADL either performed or assessed with clinical judgement   ADL Overall ADL's : Needs assistance/impaired     Grooming: Sitting;Min guard;Wash/dry face;Cueing for sequencing Grooming Details (indicate cue type and reason): Initial VC provided         Upper Body Dressing : Minimal assistance;Sitting Upper Body Dressing Details (indicate cue type and reason): to don/doff gown  Lower Body Dressing: Maximal assistance;Sitting/lateral leans;Sit to/from stand Lower Body Dressing Details (indicate cue type and reason): Max A to thread B feet through underwear then to hike underwear in standing (pt attempting to assist, however, unable to maintain standing balance without BUE support & posterior lean noted)  Toilet Transfer: Minimal assistance;Maximal assistance;+2 for physical assistance;Rolling walker (2 wheels) Toilet Transfer Details (indicate cue type and reason): simulated with multiple STS from EOB, variable levels of assistance required  Toileting- Clothing Manipulation and Hygiene: Maximal assistance;Sit to/from stand Toileting -  Architect Details (  indicate cue type and reason): for posterior hygiene after incontinent BM in underwear            Extremity/Trunk Assessment              Vision Patient Visual Report: No change from baseline Additional Comments: Difficulty locating objects in R inferior visual field this date, Max VC for compensation techniques (head turns).   Perception     Praxis      Cognition Arousal/Alertness: Awake/alert Behavior During Therapy: Flat affect Overall Cognitive Status: Impaired/Different from baseline       General Comments: Mulitmodal cues for initiation and sequencing. Delayed processing. Oriented to self and able to identify SO in room. Unable to name common food item upon request (i.e. watermelon) or describe characteristics.        Exercises      Shoulder Instructions       General Comments      Pertinent Vitals/ Pain       Pain Assessment Pain Assessment: No/denies pain  Home Living        Prior Functioning/Environment              Frequency  Min 1X/week        Progress Toward Goals  OT Goals(current goals can now be found in the care plan section)  Progress towards OT goals: Progressing toward goals  Acute Rehab OT Goals Patient Stated Goal: to feel better OT Goal Formulation: With patient Time For Goal Achievement: 01/15/23 Potential to Achieve Goals: Fair  Plan Discharge plan remains appropriate;Frequency remains appropriate    Co-evaluation    PT/OT/SLP Co-Evaluation/Treatment: Yes Reason for Co-Treatment: Necessary to address cognition/behavior during functional activity;For patient/therapist safety;To address functional/ADL transfers PT goals addressed during session: Mobility/safety with mobility;Balance OT goals addressed during session: ADL's and self-care;Proper use of Adaptive equipment and DME      AM-PAC OT "6 Clicks" Daily Activity     Outcome Measure   Help from another person eating meals?:  A Little Help from another person taking care of personal grooming?: A Little Help from another person toileting, which includes using toliet, bedpan, or urinal?: A Lot Help from another person bathing (including washing, rinsing, drying)?: A Lot Help from another person to put on and taking off regular upper body clothing?: A Little Help from another person to put on and taking off regular lower body clothing?: A Lot 6 Click Score: 15    End of Session Equipment Utilized During Treatment: Gait belt;Rolling walker (2 wheels)  OT Visit Diagnosis: Other abnormalities of gait and mobility (R26.89);Muscle weakness (generalized) (M62.81);Hemiplegia and hemiparesis Hemiplegia - Right/Left: Right Hemiplegia - dominant/non-dominant: Dominant Hemiplegia - caused by: Cerebral infarction   Activity Tolerance Patient tolerated treatment well   Patient Left in bed;with call bell/phone within reach;with bed alarm set;with family/visitor present   Nurse Communication Mobility status        Time: 1345-1413 OT Time Calculation (min): 28 min  Charges: OT General Charges $OT Visit: 1 Visit OT Treatments $Self Care/Home Management : 8-22 mins  [facility] MS, OTR/L ascom [phone]  01/03/23, 5:18 PM

## 2023-01-03 NOTE — TOC Progression Note (Signed)
Transition of Care [facility]) - Progression Note    Patient Details  Name: [name] Gilbert MRN: [id number] Date of Birth: [birth year]  Transition of Care [facility]) CM/SW Contact  Garret Reddish, RN Phone Number: 01/03/2023, 4:13 PM  Clinical Narrative:   [facility] SNF authorization submitted.  SNF authorization pending at this time.  SNF Auth ID Pending number- [id number].    TOC will continue to follow for discharge planning.      Expected Discharge Plan: Skilled Nursing Facility (Pending Medical work up) Barriers to Discharge: No Barriers Identified  Expected Discharge Plan and Services   Discharge Planning Services: CM Consult   Living arrangements for the past 2 months: Single Family Home                                       Social Determinants of Health (SDOH) Interventions SDOH Screenings   Food Insecurity: No Food Insecurity (01/01/2023)  Housing: Low Risk  (01/01/2023)  Transportation Needs: No Transportation Needs (01/01/2023)  Utilities: Not At Risk (01/01/2023)  Tobacco Use: Low Risk  (01/01/2023)    Readmission Risk Interventions     No data to display

## 2023-01-03 NOTE — Progress Notes (Signed)
[facility] Progress Note  Patient: [name] Gilbert    ZOX:[id number]  DOA: 12/31/2022     Date of Service: the patient was seen and examined on 01/03/2023  Chief Complaint  Patient presents with   Altered Mental Status   Brief hospital course: Awanda Wilcock is a 62 y.o. African-American female with PMH of IDDM T w/CKD stage IIIb, Hypertension and CVA, who presented to the emergency room with acute onset of transient altered mental status with confusion and expressive aphasia that started around 10 PM and lasted a few minutes.  She was admitted in February and discharged on 2/14 after being managed for acute stroke with mixed hemorrhagic and ischemic component.   ED workup: VS stable,  BMP blood glucose 124, BUN 24, creatinine 1.8, CBC WNL EKG as reviewed by me : EKG showed normal sinus rhythm with a rate of 100 and right axis deviation. Imaging: Portable x-ray showed no acute cardiopulmonary disease. Neurology was consulted, patient was admitted for further management as below.  Assessment and Plan:  # Acute stroke MRI Brain:  Small foci of acute ischemia within the right frontal corona radiata and the inferior margin of the right lentiform nucleus. No hemorrhage or mass effect. Old bilateral PCA territory infarcts, left-greater-than-right. CTA H/N: No emergent large vessel occlusion. Short segment occlusion of the left anterior cerebral artery A2 segment, which is patent distally. Unchanged compared to 10/15/2022. 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 Occlusion of the left PCA, unchanged compared to 10/15/2022. No hemodynamically significant stenosis of the neck. Neurology consulted, recommended dual antiplatelet therapy for 21 days given recent hemorrhage.  Started aspirin 81 mg p.o. daily and Plavix 75 mg p.o. daily for 21 days followed by Plavix only 75 mg p.o. daily 4/24 increased Crestor 40 mg p.o. 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 LDL 169, goal <70 10/15/2022 TTE shows LVEF 665%, grade 1 diastolic dysfunction.  No need to repeat as per neurology Allow permissive hypertension for 48 hours, goal BP 180/105 A1c 8.7 and TSH level 0.73 PT and OT eval done, TOC following for SNF placement    # HTN, HLD Increased Crestor 40 mg p.o. daily Held amlodipine and hydralazine for 48 hours to allow permissive hypertension Continue Malarivon and hydralazine Use IV hydralazine as needed Monitor BP and titrate medication accordingly  # History of seizures, continue Keppra 250 mg p.o. twice daily home dose  # IDDM T2, held home regimen for now 4/23 increased Semglee 12 units nightly,  4/24 discontinued NovoLog 3 units 3 times daily, due to an episode of hypoglycemia NovoLog sliding scale, monitor FSBG, continue diabetic diet Diabetic coordinator consulted  # Hypomagnesemia, mag repleted. # Hypokalemia, potassium repleted. Monitor electrolytes and replete as needed.  # CKD stage IIIb Stable, continue to monitor creatinine level daily Creatinine 1.53--1.64  # GERD on PPI    Body mass index is 30.56 kg/m.  Nutrition Problem: Inadequate oral intake Etiology: decreased appetite Interventions: Interventions: Ensure Enlive (each supplement provides 350kcal and 20 grams of protein), MVI, Liberalize Diet     Diet: Diabetic diet DVT Prophylaxis: Subcutaneous Lovenox   Advance goals of care discussion: DNR  Family Communication: family was not present at bedside, at the time of interview.  The pt provided permission to discuss medical plan with the family. Opportunity was given to ask question and all questions were answered satisfactorily.  4/24 d/w patient husband over the phone, agreed with SNF placement.  Disposition:  Pt is from Home, admitted with Acute CVA, needs SNF placement, which precludes a safe discharge. Discharge to SNF, when bed will be available, TOC is  follwoing.  Subjective: No significant events overnight, patient has AAO x 1, unable to offer any complaints due to dementia and aphasia.  Patient was resting comfortably.  Physical Exam: General: NAD, lying comfortably Appear in no distress, affect depressed and confused Eyes: PERRLA ENT: Oral Mucosa Clear, moist  Neck: no JVD,  Cardiovascular: S1 and S2 Present, no Murmur,  Respiratory: good respiratory effort, Bilateral Air entry equal and Decreased, no Crackles, no wheezes Abdomen: Bowel Sound present, Soft and no tenderness,  Skin: no rashes Extremities: no Pedal edema, no calf tenderness Neurologic: Aphasia, AO x 1, residual weakness on the right side Gait not checked due to patient safety concerns  Vitals:   01/02/23 1608 01/02/23 2010 01/02/23 2350 01/03/23 0722  BP: (!) 139/93 (!) 156/76 132/66 125/61  Pulse: 74 77 77 68  Resp: Temp: (!) 97.5 F (36.4 C) 98 F (36.7 C) 98.7 F (37.1 C) 97.8 F (36.6 C)  TempSrc: Oral     SpO2: 100% 100% 100% 100%  Weight:        Intake/Output Summary (Last 24 hours) at 01/03/2023 1339 Last data filed at 01/03/2023 1029 Gross per 24 hour  Intake 0 ml  Output --  Net 0 ml   Filed Weights   01/01/23 0130  Weight: 75.8 kg    Data Reviewed: I have personally reviewed and interpreted daily labs, tele strips, imagings as discussed above. I reviewed all nursing notes, pharmacy notes, vitals, pertinent old records I have discussed plan of care as described above with RN and patient/family.  CBC: Recent Labs  Lab 12/31/22 2350 01/01/23 0557 01/02/23 0647 01/03/23 1146  WBC 6.4 5.5 4.9 4.9  NEUTROABS 2.8  --   --   --   HGB 13.1 13.2 12.4 12.2  HCT 40.1 39.1 38.5 38.3  MCV 86.4 85.2 87.1 87.8  PLT 300 282 189 257   Basic Metabolic Panel: Recent Labs  Lab 12/31/22 2350 01/01/23 0552 01/01/23 0557 01/02/23 0647 01/03/23 1146  NA 138  --  140 137 140  K 3.7  --  3.2* 4.8 3.5  CL 103  --  105 107 110   CO2 26  --  GLUCOSE 124*  --  121* 264* 92  BUN 24*  --  CREATININE 1.80*  --  1.63* 1.53* 1.64*  CALCIUM 9.1  --  9.0 8.5* 8.5*  MG  --  1.8  --  1.8 1.6*  PHOS  --  3.0  --  3.3 3.1    Studies: No results found.  Scheduled Meds:  amLODipine  10 mg Oral Daily   aspirin EC  81 mg Oral Daily   clopidogrel  75 mg Oral Daily   enoxaparin (LOVENOX) injection  40 mg Subcutaneous Q24H   feeding supplement  237 mL Oral BID BM   hydrALAZINE  25 mg Oral TID   insulin aspart  0-15 Units Subcutaneous TID WC   insulin aspart  3 Units Subcutaneous TID WC   insulin glargine-yfgn  12 Units Subcutaneous QHS   levETIRAcetam  250 mg Oral BID   multivitamin with minerals  1 tablet Oral Daily   pantoprazole  40 mg Oral QHS   polyethylene glycol  17 g Oral Daily   rosuvastatin  40 mg Oral  QHS   senna-docusate  2 tablet Oral QHS   Continuous Infusions:  sodium chloride 75 mL/hr at 01/02/23 2124   magnesium sulfate bolus IVPB     PRN Meds: acetaminophen **OR** acetaminophen, hydrALAZINE, magnesium hydroxide, ondansetron **OR** ondansetron (ZOFRAN) IV, traZODone  Time spent: 35 minutes  Author: Gillis Santa. MD [facility] 01/03/2023 1:39 PM  To reach On-call, see care teams to locate the attending and reach out to them via [email or web address]. If 7PM-7AM, please contact night-coverage If you still have difficulty reaching the attending provider, please page the [facility] (Director on Call) for [facility] on amion for assistance.

## 2023-01-04 DIAGNOSIS — I639 Cerebral infarction, unspecified: Secondary | ICD-10-CM | POA: Diagnosis not present

## 2023-01-04 LAB — CBC
HCT: 33.4 % — ABNORMAL LOW (ref 36.0–46.0)
Hemoglobin: 11.1 g/dL — ABNORMAL LOW (ref 12.0–15.0)
MCH: 28.8 pg (ref 26.0–34.0)
MCHC: 33.2 g/dL (ref 30.0–36.0)
MCV: 86.5 fL (ref 80.0–100.0)
Platelets: 260 10*3/uL (ref 150–400)
RBC: 3.86 MIL/uL — ABNORMAL LOW (ref 3.87–5.11)
RDW: 15.5 % (ref 11.5–15.5)
WBC: 4.7 10*3/uL (ref 4.0–10.5)
nRBC: 0 % (ref 0.0–0.2)

## 2023-01-04 LAB — BASIC METABOLIC PANEL
Anion gap: 8 (ref 5–15)
BUN: 13 mg/dL (ref 8–23)
CO2: 20 mmol/L — ABNORMAL LOW (ref 22–32)
Calcium: 8.1 mg/dL — ABNORMAL LOW (ref 8.9–10.3)
Chloride: 114 mmol/L — ABNORMAL HIGH (ref 98–111)
Creatinine, Ser: 1.62 mg/dL — ABNORMAL HIGH (ref 0.44–1.00)
GFR, Estimated: 35 mL/min — ABNORMAL LOW (ref >60)
Glucose, Bld: 78 mg/dL (ref 70–99)
Potassium: 3.4 mmol/L — ABNORMAL LOW (ref 3.5–5.1)
Sodium: 142 mmol/L (ref 135–145)

## 2023-01-04 LAB — PHOSPHORUS: Phosphorus: 3.7 mg/dL (ref 2.5–4.6)

## 2023-01-04 LAB — GLUCOSE, CAPILLARY
Glucose-Capillary: 80 mg/dL (ref 70–99)
Glucose-Capillary: 107 mg/dL — ABNORMAL HIGH (ref 70–99)

## 2023-01-04 LAB — MAGNESIUM: Magnesium: 2.2 mg/dL (ref 1.7–2.4)

## 2023-01-04 MED ORDER — POTASSIUM CHLORIDE CRYS ER 20 MEQ PO TBCR
40.0000 meq | EXTENDED_RELEASE_TABLET | Freq: Once | ORAL | Status: AC
  Administered 2023-01-04: 40 meq via ORAL
  Filled 2023-01-04: qty 2

## 2023-01-04 MED ORDER — SODIUM BICARBONATE 650 MG PO TABS
650.0000 mg | ORAL_TABLET | Freq: Three times a day (TID) | ORAL | Status: DC
  Administered 2023-01-04: 650 mg via ORAL
  Filled 2023-01-04: qty 1

## 2023-01-04 MED ORDER — ROSUVASTATIN CALCIUM 40 MG PO TABS: 20.0000 mg | ORAL_TABLET | Freq: Every day | ORAL | Status: DC

## 2023-01-04 MED ORDER — ASPIRIN 81 MG PO TBEC: 81.0000 mg | DELAYED_RELEASE_TABLET | Freq: Every day | ORAL | 0 refills | Status: DC

## 2023-01-04 MED ORDER — CLOPIDOGREL BISULFATE 75 MG PO TABS: 75.0000 mg | ORAL_TABLET | Freq: Every day | ORAL | 11 refills | Status: DC

## 2023-01-04 NOTE — TOC Transition Note (Signed)
Transition of Care [facility]) - Progression Note    Patient Details  Name: [name] Gilbert MRN: [id number] Date of Birth: 06-May-1961  Transition of Care [facility]) CM/SW Contact  Garret Reddish, RN Phone Number: 01/04/2023, 10:41 AM  Clinical Narrative:   Chart reviewed.  Received [facility] SNF authorization.  Plan Berkley Harvey [id number].  Auth ID is [id number].    I have spoken with Tanya with [facility] and she informs me that she will have a bed for patient today.  She informs me that the Room number will be 1B and the number to call report will be [phone].  I have made patient's spouse, [name] Gilbert aware. [name] will sign patient in at the facility today.  I have made Provider aware of the above information.  I will sent Tanya, Admission Coordinator at [facility], patient's Discharge Summary, Discharge orders, and SNF Transfer Packet when completed.  [facility] will transport patient to facility today.  I have informed staff nurse of above information.     Expected Discharge Plan: Skilled Nursing Facility (Pending Medical work up) Barriers to Discharge: No Barriers Identified  Expected Discharge Plan and Services   Discharge Planning Services: CM Consult   Living arrangements for the past 2 months: Single Family Home                                       Social Determinants of Health (SDOH) Interventions SDOH Screenings   Food Insecurity: No Food Insecurity (01/01/2023)  Housing: Low Risk  (01/01/2023)  Transportation Needs: No Transportation Needs (01/01/2023)  Utilities: Not At Risk (01/01/2023)  Tobacco Use: Low Risk  (01/01/2023)    Readmission Risk Interventions     No data to display

## 2023-01-04 NOTE — Care Management Important Message (Signed)
Important Message  Patient Details  Name: [name] Gilbert MRN: [id number] Date of Birth: 12/30/1960   Medicare Important Message Given:  N/A - LOS <3 / Initial given by admissions     [name] A Arav Bannister 01/04/2023, 8:14 AM

## 2023-01-04 NOTE — Progress Notes (Signed)
Report called to Mrs Willeen Cass at [facility] health care

## 2023-01-04 NOTE — Discharge Summary (Signed)
[facility] Discharge Summary   Patient: Samyah Bilbo ZOX:[id number]  PCP: Tobey Grim, MD  Date of admission: 12/31/2022   Date of discharge:  01/04/2023     Discharge Diagnoses:  Principal Problem:   Stroke Active Problems:   Type II diabetes mellitus with renal manifestations   TIA (transient ischemic attack)   Essential hypertension   Seizure disorder   Dyslipidemia   GERD without esophagitis   Admitted From: Home Disposition:  SNF   Recommendations for Outpatient Follow-up:  Follow-up with PCP, patient should be seen by an MD in 1 to 2 days, continue to monitor BP and titrate medications accordingly.  Repeat CBC and BMP after 1 to 2 weeks Follow up LABS/TEST: CBC and BMP after 1 to 2 weeks   Diet recommendation: Cardiac and Carb modified diet  Activity: The patient is advised to gradually reintroduce usual activities, as tolerated  Discharge Condition: stable  Code Status: DNR   History of present illness: As per the H and P dictated on admission Hospital Course:  Bethann Qualley is a 62 y.o. African-American female with PMH of IDDM T w/CKD stage IIIb, Hypertension and CVA, who presented to the emergency room with acute onset of transient altered mental status with confusion and expressive aphasia that started around 10 PM and lasted a few minutes.  She was admitted in February and discharged on 2/14 after being managed for acute stroke with mixed hemorrhagic and ischemic component.   ED workup: VS stable,  BMP blood glucose 124, BUN 24, creatinine 1.8, CBC WNL EKG as reviewed by me : EKG showed normal sinus rhythm with a rate of 100 and right axis deviation. Imaging: Portable x-ray showed no acute cardiopulmonary disease. Neurology was consulted, patient was admitted for further management as below. Assessment and Plan: # Acute stroke MRI Brain:  Small foci of acute ischemia within the right frontal corona radiata and the inferior margin of the right  lentiform nucleus. No hemorrhage or mass effect. Old bilateral PCA territory infarcts, left-greater-than-right. CTA H/N: No emergent large vessel occlusion. Short segment occlusion of the left anterior cerebral artery A2 segment, which is patent distally. Unchanged compared to 10/15/2022. 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 Occlusion of the left PCA, unchanged compared to 10/15/2022. No hemodynamically significant stenosis of the neck. Neurology consulted, recommended dual antiplatelet therapy for 21 days given recent hemorrhage.  Started aspirin 81 mg p.o. daily and Plavix 75 mg p.o. daily for 21 days followed by Plavix only 75 mg p.o. daily. On 4/24 increased Crestor 40 mg p.o. 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 LDL 169, goal <70. On 10/15/2022 TTE shows LVEF 665%, grade 1 diastolic dysfunction.  No need to repeat as per neurology. S/p permissive hypertension for 48 hours, keep normotensive now. A1c 8.7 and TSH level 0.73. PT and OT eval done, recommend SNF placement.  TOC was following, patient got better for today, stable to discharge to SNF today.  # HTN, HLD, Increased Crestor 40 mg p.o. daily. Held amlodipine and hydralazine for 48 hours to allow permissive hypertension. Continue amlodipine  and hydralazine. Monitor BP and titrate medication accordingly # History of seizures, continue Keppra 250 mg p.o. twice daily home dose # IDDM T2, Resumed  home regimen on discharge.  S/p Semglee 12 units nightly, and NovoLog sliding scale. On 4/24 discontinued NovoLog 3 units 3 times daily, due to an episode of hypoglycemia. monitor FSBG, continue diabetic diet.   #  Mild hypokalemia, potassium 3.4 today, potassium chloride 40 mEq 1 dose given before discharge. # Hypomagnesemia, mag repleted. Resolved # Hypokalemia, potassium repleted. Resolved  # CKD stage IIIb, Stable, Cr 1.62 today.  CO2 20 today, mild acidosis, bicarb oral given.,   Repeat BMP after 1 week. # GERD, s/p PPI, Resume if needed    Body mass index is 30.56 kg/m.  Nutrition Problem: Inadequate oral intake Etiology: decreased appetite Nutrition Interventions: Interventions: Ensure Enlive (each supplement provides 350kcal and 20 grams of protein), MVI, Liberalize Diet  Patient was seen by physical therapy, who recommended SNF placement, which was arranged. On the day of the discharge the patient's vitals were stable, and no other acute medical condition were reported by patient. the patient was felt safe to be discharge at [facility].  Consultants: Neurologist Procedures: None  Discharge Exam: General: Appear in no distress, no Rash; Oral Mucosa Clear, moist. Cardiovascular: S1 and S2 Present, no Murmur, Respiratory: normal respiratory effort, Bilateral Air entry present and no Crackles, no wheezes Abdomen: Bowel Sound present, Soft and no tenderness, no hernia Extremities: no Pedal edema, no calf tenderness Neurology: alert and oriented to time, place, and person affect appropriate.  Filed Weights   01/01/23 0130  Weight: 75.8 kg   Vitals:   01/04/23 0747 01/04/23 1154  BP: (!) 150/69 (!) 149/68  Pulse: 71 76  Resp: 17 13  Temp: (!) 97.3 F (36.3 C) 97.6 F (36.4 C)  SpO2: 100% 100%    DISCHARGE MEDICATION: Allergies as of 01/04/2023       Reactions   Metformin Other (See Comments)   Reaction: unknown   Penicillins Hives, Nausea And Vomiting, Swelling   Has patient had a PCN reaction causing immediate rash, facial/tongue/throat swelling, SOB or lightheadedness with hypotension: Yes Has patient had a PCN reaction causing severe rash involving mucus membranes or skin necrosis: No Has patient had a PCN reaction that required hospitalization Yes Has patient had a PCN reaction occurring within the last 10 years: No If all of the above answers are "NO", then may proceed with Cephalosporin use.   Codeine Other (See Comments)   Reaction: HIVES    Hydralazine    Stomach pain   Statins Other (See Comments)   Myopathy with atorva 80        Medication List     STOP taking these medications    insulin glargine 100 UNIT/ML Solostar Pen Commonly known as: LANTUS   pantoprazole 40 MG tablet Commonly known as: PROTONIX       TAKE these medications    acetaminophen 325 MG tablet Commonly known as: TYLENOL Take 2 tablets (650 mg total) by mouth every 4 (four) hours as needed for mild pain (or temp > 37.5 C (99.5 F)).   amLODipine 10 MG tablet Commonly known as: NORVASC Take 10 mg by mouth daily.   aspirin EC 81 MG tablet Take 1 tablet (81 mg total) by mouth daily for 18 days. Swallow whole.   B-complex with vitamin C tablet Take 1 tablet by mouth daily.   clopidogrel 75 MG tablet Commonly known as: PLAVIX Take 1 tablet (75 mg total) by mouth daily. Start taking on: January 05, 2023   hydrALAZINE 25 MG tablet Commonly known as: APRESOLINE Take 25 mg by mouth 3 (three) times daily.   Insulin Aspart FlexPen 100 UNIT/ML Commonly known as: NOVOLOG Use up to 15 units/day, divided three times daily with meals as per MD instructions   levETIRAcetam 250 MG tablet Commonly  known as: KEPPRA Take 250 mg by mouth 2 (two) times daily.   polyethylene glycol 17 g packet Commonly known as: MIRALAX / GLYCOLAX Take 17 g by mouth daily.   rosuvastatin 40 MG tablet Commonly known as: CRESTOR Take 0.5 tablets (20 mg total) by mouth at bedtime. What changed: medication strength   senna-docusate 8.6-50 MG tablet Commonly known as: Senokot-S Take 2 tablets by mouth at bedtime.   Evaristo Bury FlexTouch 100 UNIT/ML FlexTouch Pen Generic drug: insulin degludec Inject 10 Units into the skin at bedtime.   Vitamin D (Ergocalciferol) 1.25 MG (50000 UNIT) Caps capsule Commonly known as: DRISDOL Take 50,000 Units by mouth every 7 (seven) days.       Allergies  Allergen Reactions   Metformin Other (See Comments)    Reaction:  unknown   Penicillins Hives, Nausea And Vomiting and Swelling    Has patient had a PCN reaction causing immediate rash, facial/tongue/throat swelling, SOB or lightheadedness with hypotension: Yes Has patient had a PCN reaction causing severe rash involving mucus membranes or skin necrosis: No Has patient had a PCN reaction that required hospitalization Yes Has patient had a PCN reaction occurring within the last 10 years: No If all of the above answers are "NO", then may proceed with Cephalosporin use.    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   Call MD for:  difficulty breathing, headache or visual disturbances   Complete by: As directed    Call MD for:  extreme fatigue   Complete by: As directed    Call MD for:  persistant dizziness or light-headedness   Complete by: As directed    Call MD for:  persistant nausea and vomiting   Complete by: As directed    Call MD for:  severe uncontrolled pain   Complete by: As directed    Call MD for:  temperature >100.4   Complete by: As directed    Diet - low sodium heart healthy   Complete by: As directed    Discharge instructions   Complete by: As directed    Follow-up with PCP, patient should be seen by an MD in 1 to 2 days, continue to monitor BP and titrate medications accordingly.  Repeat CBC and BMP after 1 to 2 weeks   Increase activity slowly   Complete by: As directed        The results of significant diagnostics from this hospitalization (including imaging, microbiology, ancillary and laboratory) are listed below for reference.    Significant Diagnostic Studies: MR BRAIN WO CONTRAST  Result Date: 01/01/2023 CLINICAL DATA:  Transient  ischemic attack EXAM: MRI HEAD WITHOUT CONTRAST TECHNIQUE: Multiplanar, multiecho pulse sequences of the brain and surrounding structures were obtained without intravenous contrast. COMPARISON:  10/15/2022 FINDINGS: Brain: Small foci of acute ischemia within the right frontal corona radiata and the inferior margin of the right lentiform nucleus. Old bilateral PCA territory infarcts, left-greater-than-right. Chronic microhemorrhage within the central pons. There is confluent hyperintense T2-weighted signal within the white matter. Generalized volume loss. The midline structures are normal. Vascular: Major flow voids are preserved. Skull and upper cervical spine: Normal calvarium  and skull base. Visualized upper cervical spine and soft tissues are normal. Sinuses/Orbits:No paranasal sinus fluid levels or advanced mucosal thickening. No mastoid or middle ear effusion. Normal orbits. IMPRESSION: 1. Small foci of acute ischemia within the right frontal corona radiata and the inferior margin of the right lentiform nucleus. No hemorrhage or mass effect. 2. Old bilateral PCA territory infarcts, left-greater-than-right. Electronically Signed   By: Deatra Robinson M.D.   On: 01/01/2023 03:25   CT ANGIO HEAD NECK W WO CM  Result Date: 01/01/2023 CLINICAL DATA:  Transient ischemic attack EXAM: CT ANGIOGRAPHY HEAD AND NECK WITH AND WITHOUT CONTRAST TECHNIQUE: Multidetector CT imaging of the head and neck was performed using the standard protocol during bolus administration of intravenous contrast. Multiplanar CT image reconstructions and MIPs were obtained to evaluate the vascular anatomy. Carotid stenosis measurements (when applicable) are obtained utilizing NASCET criteria, using the distal internal carotid diameter as the denominator. RADIATION DOSE REDUCTION: This exam was performed according to the departmental dose-optimization program which includes automated exposure control, adjustment of the mA and/or kV according to  patient size and/or use of iterative reconstruction technique. CONTRAST:  60mL OMNIPAQUE IOHEXOL 350 MG/ML SOLN COMPARISON:  10/15/2022 MRA head FINDINGS: CTA NECK FINDINGS SKELETON: There is no bony spinal canal stenosis. No lytic or blastic lesion. OTHER NECK: Normal pharynx, larynx and major salivary glands. No cervical lymphadenopathy. Unremarkable thyroid gland. UPPER CHEST: No pneumothorax or pleural effusion. No nodules or masses. AORTIC ARCH: There is no calcific atherosclerosis of the aortic arch. There is no aneurysm, dissection or hemodynamically significant stenosis of the visualized portion of the aorta. Conventional 3 vessel aortic branching pattern. The visualized proximal subclavian arteries are widely patent. RIGHT CAROTID SYSTEM: Normal without aneurysm, dissection or stenosis. LEFT CAROTID SYSTEM: Normal without aneurysm, dissection or stenosis. VERTEBRAL ARTERIES: Left dominant configuration. Both origins are clearly patent. There is no dissection, occlusion or flow-limiting stenosis to the skull base (V1-V3 segments). CTA HEAD FINDINGS POSTERIOR CIRCULATION: --Vertebral arteries: Normal V4 segments. --Inferior cerebellar arteries: Normal. --Basilar artery: Normal. --Superior cerebellar arteries: Normal. 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 Left PCA is occluded, unchanged on 10/15/2022. ANTERIOR CIRCULATION: --Intracranial internal carotid arteries: Normal. --Anterior cerebral arteries (ACA): Short segment occlusion of the left A2 segment, which is patent distally. Unchanged compared to 10/15/2022. 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 VENOUS SINUSES: As permitted by contrast timing, patent. ANATOMIC VARIANTS: None Review of the MIP images confirms the above findings. IMPRESSION: 1. No emergent large vessel occlusion. 2. Short segment occlusion of the left anterior  cerebral artery A2 segment, which is patent distally. Unchanged compared to 10/15/2022. 3. 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 4. Occlusion of the left PCA, unchanged compared to 10/15/2022. 5. No hemodynamically significant stenosis of the neck. Electronically Signed   By: Deatra Robinson M.D.   On: 01/01/2023 02:56   DG Chest Portable 1 View  Result Date: 01/01/2023 CLINICAL DATA:  Altered mental status. EXAM: PORTABLE CHEST 1 VIEW COMPARISON:  October 14, 2022 FINDINGS: The heart size and mediastinal contours are within normal limits. Both lungs are clear. The visualized skeletal structures are unremarkable. IMPRESSION: No active disease. Electronically Signed   By: Aram Candela M.D.   On: 01/01/2023 00:38   CT HEAD CODE STROKE WO CONTRAST  Result Date: 12/31/2022 CLINICAL DATA:  Code stroke.  Confusion and aphasia EXAM: CT HEAD WITHOUT CONTRAST TECHNIQUE: Contiguous axial images  were obtained from the base of the skull through the vertex without intravenous contrast. RADIATION DOSE REDUCTION: This exam was performed according to the departmental dose-optimization program which includes automated exposure control, adjustment of the mA and/or kV according to patient size and/or use of iterative reconstruction technique. COMPARISON:  None Available. FINDINGS: Brain: There is no mass, hemorrhage or extra-axial collection. The size and configuration of the ventricles and extra-axial CSF spaces are normal. There is hypoattenuation of the periventricular white matter, most commonly indicating chronic ischemic microangiopathy. Large, old left PCA territory infarct. Small old right occipital infarct. Vascular: No abnormal hyperdensity of the major intracranial arteries or dural venous sinuses. No intracranial atherosclerosis. Skull: The visualized skull base, calvarium and extracranial soft tissues are normal. Sinuses/Orbits: No fluid levels or advanced  mucosal thickening of the visualized paranasal sinuses. No mastoid or middle ear effusion. The orbits are normal. ASPECTS [facility] Stroke Program Early CT Score) - Ganglionic level infarction (caudate, lentiform nuclei, internal capsule, insula, M1-M3 cortex): 7 - Supraganglionic infarction (M4-M6 cortex): 3 Total score (0-10 with 10 being normal): 10 IMPRESSION: 1. No acute intracranial abnormality. 2. Large, old left PCA territory infarct and small old right occipital infarct. 3. ASPECTS is 10. These results were called by telephone at the time of interpretation on 12/31/2022 at 11:53 pm to provider [facility] , who verbally acknowledged these results. Electronically Signed   By: Deatra Robinson M.D.   On: 12/31/2022 23:53    Microbiology: No results found for this or any previous visit (from the past 240 hour(s)).   Labs: CBC: Recent Labs  Lab 12/31/22 2350 01/01/23 0557 01/02/23 0647 01/03/23 1146 01/04/23 0955  WBC 6.4 5.5 4.9 4.9 4.7  NEUTROABS 2.8  --   --   --   --   HGB 13.1 13.2 12.4 12.2 11.1*  HCT 40.1 39.1 38.5 38.3 33.4*  MCV 86.4 85.2 87.1 87.8 86.5  PLT 300 282 189 257 260   Basic Metabolic Panel: Recent Labs  Lab 12/31/22 2350 01/01/23 0552 01/01/23 0557 01/02/23 0647 01/03/23 1146 01/04/23 0819  NA 138  --  140 137 140 142  K 3.7  --  3.2* 4.8 3.5 3.4*  CL 103  --  105 107 110 114*  CO2 26  --  25 23 22  20*  GLUCOSE 124*  --  121* 264* 92 78  BUN 24*  --  20 15 13 13   CREATININE 1.80*  --  1.63* 1.53* 1.64* 1.62*  CALCIUM 9.1  --  9.0 8.5* 8.5* 8.1*  MG  --  1.8  --  1.8 1.6* 2.2  PHOS  --  3.0  --  3.3 3.1 3.7   Liver Function Tests: Recent Labs  Lab 12/31/22 2350  AST 30  ALT 13  ALKPHOS 81  BILITOT 0.8  PROT 8.3*  ALBUMIN 3.6   No results for input(s): "LIPASE", "AMYLASE" in the last 168 hours. No results for input(s): "AMMONIA" in the last 168 hours. Cardiac Enzymes: No results for input(s): "CKTOTAL", "CKMB", "CKMBINDEX", "TROPONINI" in the  last 168 hours. BNP (last 3 results) Recent Labs    01/25/22 0611 10/14/22 1429  BNP 80.8 28.2   CBG: Recent Labs  Lab 01/03/23 1325 01/03/23 1605 01/03/23 2039 01/04/23 0749 01/04/23 1140  GLUCAP 77 82 197* 80 107*    Time spent: 35 minutes  Signed:  Gillis Santa  [facility] 01/04/2023 12:09 PM

## 2023-01-04 NOTE — Progress Notes (Signed)
Physical Therapy Treatment Patient Details Name: [name] Gilbert MRN: [id number] DOB: [birth year] Today's Date: 01/04/2023   History of Present Illness [name] Gilbert is a 62 y.o. African-American female with medical history significant for diabetes mellitus, with stage IIIb chronic kidney disease, hypertension and CVA, & stroke, who presented to the emergency room with acute onset of transient altered mental status with confusion and expressive aphasia. Imaging showed "Small foci of acute ischemia within the right frontal corona radiata and the inferior margin of the right lentiform nucleus. No hemorrhage or mass effect. Old bilateral PCA territory infarcts, left-greater-than-right."    PT Comments    Patient alert, much more alert and participatory today, spoke several words at a time to this Chartered loss adjuster. Still demonstrated delayed processing and extended time to answer, oriented to self only. maxA to sit EOB, but able to sit with supervision and fair balance noted. Several seated exercises performed with max time and max multimodal cueing; sit <> stand with RW and modA performed twice. She displayed progress today. She was able to lift both BLE and take 3-4 side steps at EOB with modA and assist with RW management. Returned to supine with all needs in reach. The patient would benefit from further skilled PT intervention to continue to progress towards goals.    Recommendations for follow up therapy are one component of a multi-disciplinary discharge planning process, led by the attending physician.  Recommendations may be updated based on patient status, additional functional criteria and insurance authorization.  Follow Up Recommendations  Can patient physically be transported by private vehicle: No    Assistance Recommended at Discharge Frequent or constant Supervision/Assistance  Patient can return home with the following Two people to help with walking and/or transfers;Assistance with  cooking/housework;Assist for transportation;Help with stairs or ramp for entrance;Assistance with feeding;Two people to help with bathing/dressing/bathroom   Equipment Recommendations  None recommended by PT    Recommendations for Other Services       Precautions / Restrictions Precautions Precautions: Fall Restrictions Weight Bearing Restrictions: No     Mobility  Bed Mobility Overal bed mobility: Needs Assistance Bed Mobility: Supine to Sit, Sit to Supine     Supine to sit: Max assist Sit to supine: Max assist, +2 for physical assistance        Transfers Overall transfer level: Needs assistance Equipment used: Rolling walker (2 wheels) Transfers: Sit to/from Stand Sit to Stand: Mod assist           General transfer comment: performed twice    Ambulation/Gait Ambulation/Gait assistance: Mod assist Gait Distance (Feet): 2 Feet Assistive device: Rolling walker (2 wheels)         General Gait Details: steps at EOB with significant time, extended cueing, RW management and tactile cues for step by step sequencing   Stairs             Wheelchair Mobility    Modified Rankin (Stroke Patients Only)       Balance Overall balance assessment: Needs assistance Sitting-balance support: Feet supported Sitting balance-Leahy Scale: Fair     Standing balance support: Reliant on assistive device for balance Standing balance-Leahy Scale: Poor                              Cognition Arousal/Alertness: Awake/alert Behavior During Therapy: Flat affect Overall Cognitive Status: Impaired/Different from baseline  General Comments: Mulitmodal cues for initiation and sequencing. Delayed processing. Oriented to self        Exercises Other Exercises Other Exercises: seated LAQ x10 bilaterally, ankle pumps x10 bilaterally. max multimodal cueing to remain on task, maintain technique    General Comments         Pertinent Vitals/Pain Pain Assessment Pain Assessment: No/denies pain    Home Living                          Prior Function            PT Goals (current goals can now be found in the care plan section) Progress towards PT goals: Progressing toward goals    Frequency    Min 2X/week      PT Plan Current plan remains appropriate    Co-evaluation              AM-PAC PT "6 Clicks" Mobility   Outcome Measure  Help needed turning from your back to your side while in a flat bed without using bedrails?: A Lot Help needed moving from lying on your back to sitting on the side of a flat bed without using bedrails?: A Lot Help needed moving to and from a bed to a chair (including a wheelchair)?: A Lot Help needed standing up from a chair using your arms (e.g., wheelchair or bedside chair)?: A Lot Help needed to walk in hospital room?: Total Help needed climbing 3-5 steps with a railing? : Total 6 Click Score: 10    End of Session Equipment Utilized During Treatment: Gait belt Activity Tolerance: Patient tolerated treatment well Patient left: in bed;with call bell/phone within reach;with bed alarm set Nurse Communication: Mobility status PT Visit Diagnosis: Other abnormalities of gait and mobility (R26.89);Muscle weakness (generalized) (M62.81)     Time: 1610-9604 PT Time Calculation (min) (ACUTE ONLY): 23 min  Charges:  $Therapeutic Activity: 23-37 mins                     Olga Coaster PT, DPT 11:39 AM,01/04/23

## 2023-01-21 ENCOUNTER — Emergency Department: Payer: Medicare HMO

## 2023-01-21 ENCOUNTER — Inpatient Hospital Stay
Admission: EM | Admit: 2023-01-21 | Discharge: 2023-01-25 | DRG: 389 | Disposition: A | Discharge status: Home Health | Payer: Medicare HMO | Source: Skilled Nursing Facility | Attending: Internal Medicine | Admitting: Internal Medicine

## 2023-01-21 ENCOUNTER — Other Ambulatory Visit: Payer: Self-pay

## 2023-01-21 ENCOUNTER — Encounter: Payer: Self-pay | Admitting: Emergency Medicine

## 2023-01-21 DIAGNOSIS — I5032 Chronic diastolic (congestive) heart failure: Secondary | ICD-10-CM | POA: Diagnosis present

## 2023-01-21 DIAGNOSIS — Z794 Long term (current) use of insulin: Secondary | ICD-10-CM

## 2023-01-21 DIAGNOSIS — I10 Essential (primary) hypertension: Secondary | ICD-10-CM | POA: Diagnosis present

## 2023-01-21 DIAGNOSIS — Z7982 Long term (current) use of aspirin: Secondary | ICD-10-CM

## 2023-01-21 DIAGNOSIS — I639 Cerebral infarction, unspecified: Secondary | ICD-10-CM | POA: Diagnosis present

## 2023-01-21 DIAGNOSIS — K633 Ulcer of intestine: Secondary | ICD-10-CM | POA: Diagnosis present

## 2023-01-21 DIAGNOSIS — G40909 Epilepsy, unspecified, not intractable, without status epilepticus: Secondary | ICD-10-CM

## 2023-01-21 DIAGNOSIS — Z880 Allergy status to penicillin: Secondary | ICD-10-CM

## 2023-01-21 DIAGNOSIS — H547 Unspecified visual loss: Secondary | ICD-10-CM | POA: Diagnosis present

## 2023-01-21 DIAGNOSIS — E860 Dehydration: Secondary | ICD-10-CM | POA: Diagnosis present

## 2023-01-21 DIAGNOSIS — Z7902 Long term (current) use of antithrombotics/antiplatelets: Secondary | ICD-10-CM

## 2023-01-21 DIAGNOSIS — Z833 Family history of diabetes mellitus: Secondary | ICD-10-CM

## 2023-01-21 DIAGNOSIS — Z90710 Acquired absence of both cervix and uterus: Secondary | ICD-10-CM

## 2023-01-21 DIAGNOSIS — R109 Unspecified abdominal pain: Secondary | ICD-10-CM | POA: Diagnosis present

## 2023-01-21 DIAGNOSIS — K5900 Constipation, unspecified: Secondary | ICD-10-CM

## 2023-01-21 DIAGNOSIS — K219 Gastro-esophageal reflux disease without esophagitis: Secondary | ICD-10-CM | POA: Diagnosis present

## 2023-01-21 DIAGNOSIS — K6289 Other specified diseases of anus and rectum: Secondary | ICD-10-CM | POA: Diagnosis present

## 2023-01-21 DIAGNOSIS — K5641 Fecal impaction: Principal | ICD-10-CM | POA: Diagnosis present

## 2023-01-21 DIAGNOSIS — E1129 Type 2 diabetes mellitus with other diabetic kidney complication: Secondary | ICD-10-CM | POA: Diagnosis present

## 2023-01-21 DIAGNOSIS — Z8673 Personal history of transient ischemic attack (TIA), and cerebral infarction without residual deficits: Secondary | ICD-10-CM

## 2023-01-21 DIAGNOSIS — I69351 Hemiplegia and hemiparesis following cerebral infarction affecting right dominant side: Secondary | ICD-10-CM

## 2023-01-21 DIAGNOSIS — Z888 Allergy status to other drugs, medicaments and biological substances status: Secondary | ICD-10-CM

## 2023-01-21 DIAGNOSIS — I130 Hypertensive heart and chronic kidney disease with heart failure and stage 1 through stage 4 chronic kidney disease, or unspecified chronic kidney disease: Secondary | ICD-10-CM | POA: Diagnosis present

## 2023-01-21 DIAGNOSIS — K7460 Unspecified cirrhosis of liver: Secondary | ICD-10-CM | POA: Diagnosis present

## 2023-01-21 DIAGNOSIS — N1832 Chronic kidney disease, stage 3b: Secondary | ICD-10-CM | POA: Diagnosis present

## 2023-01-21 DIAGNOSIS — Z79899 Other long term (current) drug therapy: Secondary | ICD-10-CM

## 2023-01-21 DIAGNOSIS — Z66 Do not resuscitate: Secondary | ICD-10-CM | POA: Diagnosis present

## 2023-01-21 DIAGNOSIS — Z885 Allergy status to narcotic agent status: Secondary | ICD-10-CM

## 2023-01-21 DIAGNOSIS — E1122 Type 2 diabetes mellitus with diabetic chronic kidney disease: Secondary | ICD-10-CM | POA: Diagnosis present

## 2023-01-21 DIAGNOSIS — N179 Acute kidney failure, unspecified: Secondary | ICD-10-CM | POA: Diagnosis present

## 2023-01-21 LAB — COMPREHENSIVE METABOLIC PANEL
ALT: 15 U/L (ref 0–44)
AST: 26 U/L (ref 15–41)
Albumin: 3.8 g/dL (ref 3.5–5.0)
Alkaline Phosphatase: 107 U/L (ref 38–126)
Anion gap: 14 (ref 5–15)
BUN: 22 mg/dL (ref 8–23)
CO2: 22 mmol/L (ref 22–32)
Calcium: 9 mg/dL (ref 8.9–10.3)
Chloride: 102 mmol/L (ref 98–111)
Creatinine, Ser: 2.09 mg/dL — ABNORMAL HIGH (ref 0.44–1.00)
GFR, Estimated: 26 mL/min — ABNORMAL LOW (ref >60)
Glucose, Bld: 193 mg/dL — ABNORMAL HIGH (ref 70–99)
Potassium: 3.6 mmol/L (ref 3.5–5.1)
Sodium: 138 mmol/L (ref 135–145)
Total Bilirubin: 0.8 mg/dL (ref 0.3–1.2)
Total Protein: 9 g/dL — ABNORMAL HIGH (ref 6.5–8.1)

## 2023-01-21 LAB — URINALYSIS, W/ REFLEX TO CULTURE (INFECTION SUSPECTED)
Bilirubin Urine: NEGATIVE
Glucose, UA: NEGATIVE mg/dL
Ketones, ur: NEGATIVE mg/dL
Leukocytes,Ua: NEGATIVE
Nitrite: NEGATIVE
Protein, ur: 100 mg/dL — AB
Specific Gravity, Urine: 1.025 (ref 1.005–1.030)
pH: 5.5 (ref 5.0–8.0)

## 2023-01-21 LAB — CBC
HCT: 38.7 % (ref 36.0–46.0)
Hemoglobin: 12.7 g/dL (ref 12.0–15.0)
MCH: 28.5 pg (ref 26.0–34.0)
MCHC: 32.8 g/dL (ref 30.0–36.0)
MCV: 87 fL (ref 80.0–100.0)
Platelets: 311 10*3/uL (ref 150–400)
RBC: 4.45 MIL/uL (ref 3.87–5.11)
RDW: 15.2 % (ref 11.5–15.5)
WBC: 6.7 10*3/uL (ref 4.0–10.5)
nRBC: 0 % (ref 0.0–0.2)

## 2023-01-21 LAB — APTT: aPTT: 29 seconds (ref 24–36)

## 2023-01-21 LAB — PROTIME-INR
INR: 1.1 (ref 0.8–1.2)
Prothrombin Time: 13.9 seconds (ref 11.4–15.2)

## 2023-01-21 LAB — LACTIC ACID, PLASMA
Lactic Acid, Venous: 2.6 mmol/L (ref 0.5–1.9)
Lactic Acid, Venous: 0.8 mmol/L (ref 0.5–1.9)

## 2023-01-21 MED ORDER — FENTANYL CITRATE PF 50 MCG/ML IJ SOSY
50.0000 ug | PREFILLED_SYRINGE | Freq: Once | INTRAMUSCULAR | Status: AC
  Administered 2023-01-21: 50 ug via INTRAVENOUS
  Filled 2023-01-21: qty 1

## 2023-01-21 MED ORDER — LEVOFLOXACIN IN D5W 750 MG/150ML IV SOLN
750.0000 mg | Freq: Once | INTRAVENOUS | Status: AC
  Administered 2023-01-21: 750 mg via INTRAVENOUS
  Filled 2023-01-21: qty 150

## 2023-01-21 MED ORDER — LACTATED RINGERS IV SOLN: INTRAVENOUS | Status: AC

## 2023-01-21 MED ORDER — SODIUM CHLORIDE 0.9 % IV BOLUS
500.0000 mL | Freq: Once | INTRAVENOUS | Status: AC
  Administered 2023-01-21: 500 mL via INTRAVENOUS

## 2023-01-21 MED ORDER — SODIUM CHLORIDE 0.9 % IV BOLUS
1000.0000 mL | Freq: Once | INTRAVENOUS | Status: AC
  Administered 2023-01-21: 1000 mL via INTRAVENOUS

## 2023-01-21 MED ORDER — METRONIDAZOLE 500 MG/100ML IV SOLN
500.0000 mg | Freq: Once | INTRAVENOUS | Status: AC
  Administered 2023-01-21: 500 mg via INTRAVENOUS
  Filled 2023-01-21: qty 100

## 2023-01-21 NOTE — Consult Note (Signed)
CODE SEPSIS - PHARMACY COMMUNICATION  **Broad Spectrum Antibiotics should be administered within 1 hour of Sepsis diagnosis**  Time Code Sepsis Called/Page Received: 1627  Antibiotics Ordered: levofloxacin and Flagyl  Time of 1st antibiotic administration: 1741  Additional action taken by pharmacy: Messaged RN  If necessary, Name of Provider/Nurse Contacted: Marland Kitchen, RN  Barrie Folk ,PharmD Clinical Pharmacist  01/21/2023  4:35 PM

## 2023-01-21 NOTE — Consult Note (Signed)
PHARMACY -  BRIEF ANTIBIOTIC NOTE   Pharmacy has received consult(s) for levofloxacin dosing from an ED provider.  The patient's profile has been reviewed for ht/wt/allergies/indication/available labs.    One time order(s) placed for levofloxacin 750 mg IV x 1  Further antibiotics/pharmacy consults should be ordered by admitting physician if indicated.                       Thank you, Barrie Folk 01/21/2023  4:34 PM

## 2023-01-21 NOTE — ED Provider Notes (Signed)
[facility] Provider Note    Event Date/Time   First MD Initiated Contact with Patient 01/21/23 1515     (approximate)   History   Altered Mental Status   HPI  [name] Gilbert is a 62 y.o. female past medical history significant for prior CVA, diabetes, history of seizures on Keppra, CKD stage III, GERD, who presents to the emergency department with abdominal pain.  History is provided by the patient's daughter who is at bedside.  States that they have been using laxatives at the nursing facility and felt like today she was in severe pain.  Yelling out complaining of abdominal pain.  Daughter felt like she was impacted and no one had been disimpacting her at the facility or treating her constipation.  No known fevers.  No known falls or trauma.  No history of a bowel obstruction.     Physical Exam   Triage Vital Signs: ED Triage Vitals  Enc Vitals Group     BP 01/21/23 1456 (!) 174/82     Pulse Rate 01/21/23 1456 (!) 114     Resp 01/21/23 1456 20     Temp 01/21/23 1502 98.7 F (37.1 C)     Temp Source 01/21/23 1502 Oral     SpO2 01/21/23 1456 100 %     Weight 01/21/23 1457 165 lb 5.5 oz (75 kg)     Height 01/21/23 1457 5\' 2"  (1.575 m)     Head Circumference --      Peak Flow --      Pain Score --      Pain Loc --      Pain Edu? --      Excl. in GC? --     Most recent vital signs: Vitals:   01/21/23 2200 01/21/23 2230  BP: (!) 160/84 (!) 140/66  Pulse: 87 78  Resp: 15 11  Temp:    SpO2: 100% 99%    Physical Exam Constitutional:      Appearance: She is well-developed.  HENT:     Head: Atraumatic.  Eyes:     Conjunctiva/sclera: Conjunctivae normal.  Cardiovascular:     Rate and Rhythm: Regular rhythm. Tachycardia present.  Pulmonary:     Effort: No respiratory distress.  Abdominal:     General: There is no distension.     Tenderness: There is abdominal tenderness (Left lower quadrant abdominal tenderness to palpation).   Genitourinary:    Comments: Rectal exam with stool impaction with multiple hard stools.  Brown stools with scant amount of bright red blood. Musculoskeletal:        General: Normal range of motion.     Cervical back: Normal range of motion.  Skin:    General: Skin is warm.  Neurological:     Mental Status: She is alert. Mental status is at baseline.     IMPRESSION / MDM / ASSESSMENT AND PLAN / ED COURSE  I reviewed the triage vital signs and the nursing notes.  Differential diagnosis including sepsis, urinary tract infection, SBO, diverticulitis, constipation, impaction  On arrival patient in obvious distress, tachycardic and hypertensive.  Patient given 500 cc bolus, given IV fentanyl for pain control.  Plan for lab work and CT imaging.  EKG  I, Corena Herter, the attending physician, personally viewed and interpreted this ECG.   Rate: Normal  Rhythm: Normal sinus  Axis: Normal  Intervals: Normal  ST&T Change: None  No tachycardic or bradycardic dysrhythmias while on cardiac telemetry.  RADIOLOGY I independently reviewed imaging, my interpretation of imaging: CT abdomen and pelvis with contrast -no signs of bowel obstruction.  Read as proctitis  LABS (all labs ordered are listed, but only abnormal results are displayed) Labs interpreted as -    Labs Reviewed  COMPREHENSIVE METABOLIC PANEL - Abnormal; Notable for the following components:      Result Value   Glucose, Bld 193 (*)    Creatinine, Ser 2.09 (*)    Total Protein 9.0 (*)    GFR, Estimated 26 (*)    All other components within normal limits  LACTIC ACID, PLASMA - Abnormal; Notable for the following components:   Lactic Acid, Venous 2.6 (*)    All other components within normal limits  URINALYSIS, W/ REFLEX TO CULTURE (INFECTION SUSPECTED) - Abnormal; Notable for the following components:   Color, Urine YELLOW (*)    APPearance CLEAR (*)    Hgb urine dipstick TRACE (*)    Protein, ur 100 (*)     Bacteria, UA MANY (*)    All other components within normal limits  CULTURE, BLOOD (ROUTINE X 2)  CULTURE, BLOOD (ROUTINE X 2)  CBC  LACTIC ACID, PLASMA  PROTIME-INR  APTT  CBG MONITORING, ED     MDM    Initial lactic acid elevated at 2.6.  No significant leukocytosis or anemia.  Elevation of creatinine to 2 from a baseline of 1.6.  No significant electrolyte abnormalities.  Patient given IV antibiotics with Levaquin and Flagyl.  Given IV fluids.  On reevaluation continues to have significant tenderness to palpation to her rectal exam, no ongoing impaction.  Consulted hospitalist for admission for proctitis and concern for an infectious process.  On reevaluation improvement of her mental status.  Husband states that they would not want the patient to go back to [facility].   PROCEDURES:  Critical Care performed: No  Procedures  Patient's presentation is most consistent with acute presentation with potential threat to life or bodily function.   MEDICATIONS ORDERED IN ED: Medications  lactated ringers infusion ( Intravenous New Bag/Given 01/21/23 1944)  sodium chloride 0.9 % bolus 500 mL (0 mLs Intravenous Stopped 01/21/23 1815)  fentaNYL (SUBLIMAZE) injection 50 mcg (50 mcg Intravenous Given 01/21/23 1636)  levofloxacin (LEVAQUIN) IVPB 750 mg (0 mg Intravenous Stopped 01/21/23 1912)  metroNIDAZOLE (FLAGYL) IVPB 500 mg (0 mg Intravenous Stopped 01/21/23 1908)  sodium chloride 0.9 % bolus 1,000 mL (0 mLs Intravenous Stopped 01/21/23 1908)    FINAL CLINICAL IMPRESSION(S) / ED DIAGNOSES   Final diagnoses:  Dehydration  Proctitis     Rx / DC Orders   ED Discharge Orders     None        Note:  This document was prepared using Dragon voice recognition software and may include unintentional dictation errors.   Corena Herter, MD 01/21/23 [phone]

## 2023-01-21 NOTE — ED Notes (Signed)
Family at bedside. Family states that the patient is having a hard time having a bowel movement. Family reports that pt has had enemas and stool softeners with improvement but still having a hard time.

## 2023-01-21 NOTE — Hospital Course (Signed)
Admission request for recent cva [facility]. Coming for abd pain. Sepsis+ +lactic MIVF. IV ABX. Levaquin flagyl . No wbc. AKI Ct : proctitis. Pt disimpacted her. Placement. Ct head : ? Confusion.  Pt is at baseline. Perirectal pain.

## 2023-01-21 NOTE — ED Notes (Signed)
This NT along with the RN Theron changed brief and linens for pt at this time. Peri care provided. Pt tolerated well.

## 2023-01-21 NOTE — ED Notes (Signed)
Pt has family at bedside. Pt is drinking/tolerating water. VSS. Per daughter, the pt is to not go back to [facility] and she is to go home with family.

## 2023-01-21 NOTE — Sepsis Progress Note (Signed)
Sepsis protocl is being followed by [facility].

## 2023-01-22 DIAGNOSIS — G40909 Epilepsy, unspecified, not intractable, without status epilepticus: Secondary | ICD-10-CM

## 2023-01-22 DIAGNOSIS — Z79899 Other long term (current) drug therapy: Secondary | ICD-10-CM | POA: Diagnosis not present

## 2023-01-22 DIAGNOSIS — N170 Acute kidney failure with tubular necrosis: Secondary | ICD-10-CM

## 2023-01-22 DIAGNOSIS — R1084 Generalized abdominal pain: Secondary | ICD-10-CM | POA: Diagnosis not present

## 2023-01-22 DIAGNOSIS — H547 Unspecified visual loss: Secondary | ICD-10-CM | POA: Diagnosis present

## 2023-01-22 DIAGNOSIS — Z515 Encounter for palliative care: Secondary | ICD-10-CM | POA: Diagnosis not present

## 2023-01-22 DIAGNOSIS — N179 Acute kidney failure, unspecified: Secondary | ICD-10-CM | POA: Diagnosis present

## 2023-01-22 DIAGNOSIS — K5909 Other constipation: Secondary | ICD-10-CM | POA: Diagnosis not present

## 2023-01-22 DIAGNOSIS — N1832 Chronic kidney disease, stage 3b: Secondary | ICD-10-CM | POA: Diagnosis present

## 2023-01-22 DIAGNOSIS — K5641 Fecal impaction: Secondary | ICD-10-CM | POA: Diagnosis present

## 2023-01-22 DIAGNOSIS — Z8673 Personal history of transient ischemic attack (TIA), and cerebral infarction without residual deficits: Secondary | ICD-10-CM

## 2023-01-22 DIAGNOSIS — K219 Gastro-esophageal reflux disease without esophagitis: Secondary | ICD-10-CM | POA: Diagnosis present

## 2023-01-22 DIAGNOSIS — R1030 Lower abdominal pain, unspecified: Secondary | ICD-10-CM | POA: Diagnosis not present

## 2023-01-22 DIAGNOSIS — E1122 Type 2 diabetes mellitus with diabetic chronic kidney disease: Secondary | ICD-10-CM

## 2023-01-22 DIAGNOSIS — I5032 Chronic diastolic (congestive) heart failure: Secondary | ICD-10-CM | POA: Diagnosis present

## 2023-01-22 DIAGNOSIS — K5900 Constipation, unspecified: Secondary | ICD-10-CM | POA: Insufficient documentation

## 2023-01-22 DIAGNOSIS — I10 Essential (primary) hypertension: Secondary | ICD-10-CM

## 2023-01-22 DIAGNOSIS — Z833 Family history of diabetes mellitus: Secondary | ICD-10-CM | POA: Diagnosis not present

## 2023-01-22 DIAGNOSIS — K633 Ulcer of intestine: Secondary | ICD-10-CM | POA: Diagnosis present

## 2023-01-22 DIAGNOSIS — Z7189 Other specified counseling: Secondary | ICD-10-CM | POA: Diagnosis not present

## 2023-01-22 DIAGNOSIS — Z90710 Acquired absence of both cervix and uterus: Secondary | ICD-10-CM | POA: Diagnosis not present

## 2023-01-22 DIAGNOSIS — K7460 Unspecified cirrhosis of liver: Secondary | ICD-10-CM | POA: Diagnosis present

## 2023-01-22 DIAGNOSIS — Z7902 Long term (current) use of antithrombotics/antiplatelets: Secondary | ICD-10-CM | POA: Diagnosis not present

## 2023-01-22 DIAGNOSIS — K6289 Other specified diseases of anus and rectum: Secondary | ICD-10-CM | POA: Diagnosis present

## 2023-01-22 DIAGNOSIS — R109 Unspecified abdominal pain: Secondary | ICD-10-CM | POA: Diagnosis present

## 2023-01-22 DIAGNOSIS — Z7982 Long term (current) use of aspirin: Secondary | ICD-10-CM | POA: Diagnosis not present

## 2023-01-22 DIAGNOSIS — Z880 Allergy status to penicillin: Secondary | ICD-10-CM | POA: Diagnosis not present

## 2023-01-22 DIAGNOSIS — E1121 Type 2 diabetes mellitus with diabetic nephropathy: Secondary | ICD-10-CM | POA: Diagnosis not present

## 2023-01-22 DIAGNOSIS — I130 Hypertensive heart and chronic kidney disease with heart failure and stage 1 through stage 4 chronic kidney disease, or unspecified chronic kidney disease: Secondary | ICD-10-CM | POA: Diagnosis present

## 2023-01-22 DIAGNOSIS — Z794 Long term (current) use of insulin: Secondary | ICD-10-CM | POA: Diagnosis not present

## 2023-01-22 DIAGNOSIS — I69351 Hemiplegia and hemiparesis following cerebral infarction affecting right dominant side: Secondary | ICD-10-CM | POA: Diagnosis not present

## 2023-01-22 DIAGNOSIS — E860 Dehydration: Secondary | ICD-10-CM | POA: Diagnosis present

## 2023-01-22 DIAGNOSIS — Z66 Do not resuscitate: Secondary | ICD-10-CM | POA: Diagnosis present

## 2023-01-22 DIAGNOSIS — Z885 Allergy status to narcotic agent status: Secondary | ICD-10-CM | POA: Diagnosis not present

## 2023-01-22 DIAGNOSIS — Z888 Allergy status to other drugs, medicaments and biological substances status: Secondary | ICD-10-CM | POA: Diagnosis not present

## 2023-01-22 LAB — CBC
HCT: 30 % — ABNORMAL LOW (ref 36.0–46.0)
Hemoglobin: 9.9 g/dL — ABNORMAL LOW (ref 12.0–15.0)
MCH: 28.6 pg (ref 26.0–34.0)
MCHC: 33 g/dL (ref 30.0–36.0)
MCV: 86.7 fL (ref 80.0–100.0)
Platelets: 259 10*3/uL (ref 150–400)
RBC: 3.46 MIL/uL — ABNORMAL LOW (ref 3.87–5.11)
RDW: 14.9 % (ref 11.5–15.5)
WBC: 5.2 10*3/uL (ref 4.0–10.5)
nRBC: 0 % (ref 0.0–0.2)

## 2023-01-22 LAB — COMPREHENSIVE METABOLIC PANEL
ALT: 12 U/L (ref 0–44)
AST: 19 U/L (ref 15–41)
Albumin: 2.8 g/dL — ABNORMAL LOW (ref 3.5–5.0)
Alkaline Phosphatase: 80 U/L (ref 38–126)
Anion gap: 7 (ref 5–15)
BUN: 18 mg/dL (ref 8–23)
CO2: 24 mmol/L (ref 22–32)
Calcium: 8 mg/dL — ABNORMAL LOW (ref 8.9–10.3)
Chloride: 107 mmol/L (ref 98–111)
Creatinine, Ser: 1.78 mg/dL — ABNORMAL HIGH (ref 0.44–1.00)
GFR, Estimated: 32 mL/min — ABNORMAL LOW (ref >60)
Glucose, Bld: 267 mg/dL — ABNORMAL HIGH (ref 70–99)
Potassium: 3.7 mmol/L (ref 3.5–5.1)
Sodium: 138 mmol/L (ref 135–145)
Total Bilirubin: 0.6 mg/dL (ref 0.3–1.2)
Total Protein: 6.8 g/dL (ref 6.5–8.1)

## 2023-01-22 LAB — GLUCOSE, CAPILLARY
Glucose-Capillary: 245 mg/dL — ABNORMAL HIGH (ref 70–99)
Glucose-Capillary: 263 mg/dL — ABNORMAL HIGH (ref 70–99)
Glucose-Capillary: 207 mg/dL — ABNORMAL HIGH (ref 70–99)
Glucose-Capillary: 139 mg/dL — ABNORMAL HIGH (ref 70–99)

## 2023-01-22 LAB — CULTURE, BLOOD (ROUTINE X 2): Culture: NO GROWTH

## 2023-01-22 MED ORDER — HYDRALAZINE HCL 25 MG PO TABS
25.0000 mg | ORAL_TABLET | Freq: Three times a day (TID) | ORAL | Status: DC
  Administered 2023-01-22: 25 mg via ORAL
  Filled 2023-01-22 (×2): qty 1

## 2023-01-22 MED ORDER — AMLODIPINE BESYLATE 10 MG PO TABS
10.0000 mg | ORAL_TABLET | Freq: Every day | ORAL | Status: DC
  Administered 2023-01-22 – 2023-01-25 (×4): 10 mg via ORAL
  Filled 2023-01-22 (×4): qty 1

## 2023-01-22 MED ORDER — ACETAMINOPHEN 325 MG PO TABS: 650.0000 mg | ORAL_TABLET | ORAL | Status: DC | PRN

## 2023-01-22 MED ORDER — POLYETHYLENE GLYCOL 3350 17 G PO PACK
17.0000 g | PACK | Freq: Two times a day (BID) | ORAL | Status: DC
  Administered 2023-01-22 – 2023-01-25 (×7): 17 g via ORAL
  Filled 2023-01-22 (×7): qty 1

## 2023-01-22 MED ORDER — HYDRALAZINE HCL 20 MG/ML IJ SOLN: 5.0000 mg | INTRAMUSCULAR | Status: DC | PRN

## 2023-01-22 MED ORDER — ROSUVASTATIN CALCIUM 10 MG PO TABS
20.0000 mg | ORAL_TABLET | Freq: Every day | ORAL | Status: DC
  Administered 2023-01-22 – 2023-01-24 (×4): 20 mg via ORAL
  Filled 2023-01-22: qty 2
  Filled 2023-01-22: qty 1
  Filled 2023-01-22 (×2): qty 2

## 2023-01-22 MED ORDER — CLOPIDOGREL BISULFATE 75 MG PO TABS
75.0000 mg | ORAL_TABLET | Freq: Every day | ORAL | Status: DC
  Administered 2023-01-22 – 2023-01-25 (×4): 75 mg via ORAL
  Filled 2023-01-22 (×4): qty 1

## 2023-01-22 MED ORDER — SENNOSIDES-DOCUSATE SODIUM 8.6-50 MG PO TABS
2.0000 | ORAL_TABLET | Freq: Every day | ORAL | Status: DC
  Administered 2023-01-22 – 2023-01-24 (×3): 2 via ORAL
  Filled 2023-01-22 (×3): qty 2

## 2023-01-22 MED ORDER — DOCUSATE SODIUM 100 MG PO CAPS
200.0000 mg | ORAL_CAPSULE | Freq: Every day | ORAL | Status: DC
  Administered 2023-01-22 – 2023-01-25 (×4): 200 mg via ORAL
  Filled 2023-01-22 (×4): qty 2

## 2023-01-22 MED ORDER — PRAMOXINE-HC 1-1 % EX FOAM
1.0000 | Freq: Two times a day (BID) | CUTANEOUS | Status: DC | PRN
  Filled 2023-01-22: qty 10

## 2023-01-22 MED ORDER — ACETAMINOPHEN 325 MG PO TABS
650.0000 mg | ORAL_TABLET | Freq: Four times a day (QID) | ORAL | Status: DC | PRN
  Administered 2023-01-22: 650 mg via ORAL
  Filled 2023-01-22 (×2): qty 2

## 2023-01-22 MED ORDER — BISACODYL 10 MG RE SUPP
10.0000 mg | Freq: Every evening | RECTAL | Status: DC
  Administered 2023-01-22 – 2023-01-24 (×3): 10 mg via RECTAL
  Filled 2023-01-22 (×3): qty 1

## 2023-01-22 MED ORDER — INSULIN ASPART 100 UNIT/ML IJ SOLN
0.0000 [IU] | Freq: Every day | INTRAMUSCULAR | Status: DC
  Administered 2023-01-23: 3 [IU] via SUBCUTANEOUS
  Administered 2023-01-24: 5 [IU] via SUBCUTANEOUS
  Filled 2023-01-22 (×2): qty 1

## 2023-01-22 MED ORDER — INSULIN GLARGINE-YFGN 100 UNIT/ML ~~LOC~~ SOLN
10.0000 [IU] | Freq: Every day | SUBCUTANEOUS | Status: DC
  Administered 2023-01-22 – 2023-01-24 (×3): 10 [IU] via SUBCUTANEOUS
  Filled 2023-01-22 (×5): qty 0.1

## 2023-01-22 MED ORDER — HEPARIN SODIUM (PORCINE) 5000 UNIT/ML IJ SOLN
5000.0000 [IU] | Freq: Three times a day (TID) | INTRAMUSCULAR | Status: DC
  Administered 2023-01-22 – 2023-01-25 (×9): 5000 [IU] via SUBCUTANEOUS
  Filled 2023-01-22 (×9): qty 1

## 2023-01-22 MED ORDER — LACTATED RINGERS IV SOLN: INTRAVENOUS | Status: AC

## 2023-01-22 MED ORDER — ASPIRIN 81 MG PO TBEC
81.0000 mg | DELAYED_RELEASE_TABLET | Freq: Every day | ORAL | Status: DC
  Administered 2023-01-22 – 2023-01-25 (×4): 81 mg via ORAL
  Filled 2023-01-22 (×4): qty 1

## 2023-01-22 MED ORDER — METOPROLOL SUCCINATE ER 25 MG PO TB24
25.0000 mg | ORAL_TABLET | Freq: Every day | ORAL | Status: DC
  Administered 2023-01-22 – 2023-01-25 (×4): 25 mg via ORAL
  Filled 2023-01-22 (×4): qty 1

## 2023-01-22 MED ORDER — SODIUM CHLORIDE 0.9% FLUSH
3.0000 mL | Freq: Two times a day (BID) | INTRAVENOUS | Status: DC
  Administered 2023-01-22 – 2023-01-24 (×5): 3 mL via INTRAVENOUS

## 2023-01-22 MED ORDER — SODIUM CHLORIDE 0.9 % IV SOLN
1.0000 g | INTRAVENOUS | Status: DC
  Administered 2023-01-22 – 2023-01-24 (×3): 1 g via INTRAVENOUS
  Filled 2023-01-22 (×3): qty 10

## 2023-01-22 MED ORDER — POLYETHYLENE GLYCOL 3350 17 G PO PACK: 17.0000 g | PACK | Freq: Every day | ORAL | Status: DC

## 2023-01-22 MED ORDER — ACETAMINOPHEN 650 MG RE SUPP: 650.0000 mg | Freq: Four times a day (QID) | RECTAL | Status: DC | PRN

## 2023-01-22 MED ORDER — LEVETIRACETAM 250 MG PO TABS
250.0000 mg | ORAL_TABLET | Freq: Two times a day (BID) | ORAL | Status: DC
  Administered 2023-01-22 – 2023-01-25 (×8): 250 mg via ORAL
  Filled 2023-01-22 (×10): qty 1

## 2023-01-22 MED ORDER — LACTULOSE 10 GM/15ML PO SOLN
10.0000 g | Freq: Every day | ORAL | Status: AC
  Administered 2023-01-22 (×2): 10 g via ORAL
  Filled 2023-01-22 (×2): qty 30

## 2023-01-22 MED ORDER — INSULIN ASPART 100 UNIT/ML IJ SOLN
0.0000 [IU] | Freq: Three times a day (TID) | INTRAMUSCULAR | Status: DC
  Administered 2023-01-22: 2 [IU] via SUBCUTANEOUS
  Administered 2023-01-22: 3 [IU] via SUBCUTANEOUS
  Administered 2023-01-23: 2 [IU] via SUBCUTANEOUS
  Filled 2023-01-22 (×4): qty 1

## 2023-01-22 NOTE — Assessment & Plan Note (Addendum)
Patient on Rocephin and Flagyl.  Need to keep bowel movements going.  Increase MiraLAX to twice daily dosing.  Sepsis ruled out.

## 2023-01-22 NOTE — Assessment & Plan Note (Addendum)
Continue Norvasc and add Toprol.  Allergy listed in the computer to hydralazine.

## 2023-01-22 NOTE — Assessment & Plan Note (Addendum)
Lower abdominal pain secondary to proctitis.  Urinalysis negative.

## 2023-01-22 NOTE — H&P (Signed)
History and Physical     Patient: [name] Gilbert ZOX:[id number] DOB: Feb 19, 1961 DOA: 01/21/2023 DOS: the patient was seen and examined on 01/22/2023 PCP: Tobey Grim, MD   Patient coming from: Home  Chief Complaint: Abdominal pain  HISTORY OF PRESENT ILLNESS: [name] Gilbert is an 62 y.o. female seen in the emergency room for abdominal pain.  HPI is limited due to patient's age.  Patient in the facility with yelling because of the abdominal pain and family member reported that they think that she may be impacted Patient not treating her constipation.  No reports of falls fever chest pain shortness of breath bleeding seizures or any complaints.  Patient has been on stool softeners, but still is not helping. HPI was per daughter at bedside in the emergency room. In the emergency room patient is not tolerating drinking water per nurses report.  Family wishes for patient to be placed in another facility. Patient was disimpacted in the emergency room still continues to have perirectal pain.  Patient has past medical history of diabetes mellitus with CKD stage IIIb pressure history of CVA and has visual impairment . Patient has allergies to metformin, penicillin, codeine, hydralazine, statin. Initial vitals in the emergency room show patient has tachycardia respiratory rate of 20 meeting severe sepsis criteria.   Past Medical History:  Diagnosis Date   Diabetes mellitus without complication (HCC)    Hypertension    Stroke (HCC)    visual impaired    Review of Systems  Unable to perform ROS: Acuity of condition  Gastrointestinal:  Positive for abdominal pain.   Allergies  Allergen Reactions   Metformin Other (See Comments)    Reaction: unknown   Penicillins Hives, Nausea And Vomiting and Swelling    Has patient had a PCN reaction causing immediate rash, facial/tongue/throat swelling, SOB or lightheadedness with hypotension: Yes Has patient had a PCN reaction causing severe  rash involving mucus membranes or skin necrosis: No Has patient had a PCN reaction that required hospitalization Yes Has patient had a PCN reaction occurring within the last 10 years: No If all of the above answers are "NO", then may proceed with Cephalosporin use.    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 Authorizing Provider  acetaminophen (TYLENOL) 325 MG tablet Take 2 tablets (650 mg total) by mouth every 4 (four) hours as needed for mild pain (or temp > 37.5 C (99.5 F)). 10/25/22   Osvaldo Shipper, MD  amLODipine (NORVASC) 10 MG tablet Take 10 mg by mouth daily. 10/03/22   [provider]  aspirin EC 81 MG tablet Take 1 tablet (81 mg total) by mouth daily for 18 days. Swallow whole. 01/04/23 01/22/23  Gillis Santa, MD  B Complex-C (B-COMPLEX WITH VITAMIN C) tablet Take 1 tablet by mouth daily. Patient not taking: Reported on 10/16/2022 02/17/22   Angiulli, Mcarthur Rossetti, PA-C  clopidogrel (PLAVIX) 75 MG tablet Take 1 tablet (75 mg total) by mouth daily. 01/05/23 01/05/24  Gillis Santa, MD  hydrALAZINE (APRESOLINE) 25 MG tablet Take 25 mg by mouth 3 (three) times daily.    [provider]  Insulin Aspart FlexPen (NOVOLOG) 100 UNIT/ML Use  up to 15 units/day, divided three times daily with meals as per MD instructions 11/30/22   [provider]  levETIRAcetam (KEPPRA) 250 MG tablet Take 250 mg by mouth 2 (two) times daily. 10/03/22   [provider]  polyethylene glycol (MIRALAX / GLYCOLAX) 17 g packet Take 17 g by mouth daily. 11/30/22   [provider]  rosuvastatin (CRESTOR) 40 MG tablet Take 0.5 tablets (20 mg total) by mouth at bedtime. 01/04/23   Gillis Santa, MD   senna-docusate (SENOKOT-S) 8.6-50 MG tablet Take 2 tablets by mouth at bedtime. 11/30/22 11/30/23  [provider]  TRESIBA FLEXTOUCH 100 UNIT/ML FlexTouch Pen Inject 10 Units into the skin at bedtime. 12/08/22   [provider]  Vitamin D, Ergocalciferol, (DRISDOL) 1.25 MG (50000 UNIT) CAPS capsule Take 50,000 Units by mouth every 7 (seven) days. 09/06/22   [provider]    cefTRIAXone (ROCEPHIN)  IV 1 g (01/22/23 0153)   lactated ringers 150 mL/hr at 01/21/23 1944   lactated ringers 50 mL/hr at 01/22/23 0122  ED Course: Pt in Ed is awake alert cooperative but disoriented. Vitals:   01/22/23 0030 01/22/23 0100 01/22/23 0130 01/22/23 0200  BP: (!) 153/78 (!) 152/76 137/81 (!) 151/103  Pulse:      Resp: 19 16    Temp:      TempSrc:      SpO2:      Weight:      Height:       No intake/output data recorded. SpO2: 99 % Blood work in ed shows: Glucose of 193, creatinine of 2.09, total protein of 9.0 and GFR of 26. Lactic of 2.6. CBC is within normal limits. Blood cultures obtained in the emergency room as patient meets severe sepsis criteria. >CT abdomen and pelvis without contrast: IMPRESSION: 1. Diffuse wall thickening in the rectum with adjacent mild fat stranding as can be seen with proctitis. 2. No CT finding to explain the patient's left lower quadrant pain. 3. Lobulated contour of the liver suggesting cirrhosis.  Aortic Atherosclerosis (ICD10-I70.0). >CT head: IMPRESSION: 1. Old left occipital and temporal lobe infarct. 2. No acute abnormalities. 3. White matter changes. Results for orders placed or performed during the hospital encounter of 01/21/23 (from the past 72 hour(s))  Comprehensive metabolic panel     Status: Abnormal   Collection Time: 01/21/23  3:18 PM  Result Value Ref Range   Sodium 138 135 - 145 mmol/L   Potassium 3.6 3.5 - 5.1 mmol/L   Chloride 102 98 - 111 mmol/L   CO2 22 22 - 32 mmol/L   Glucose, Bld 193 (H) 70 - 99 mg/dL     Comment: Glucose reference range applies only to samples taken after fasting for at least 8 hours.   BUN 22 8 - 23 mg/dL   Creatinine, Ser 1.61 (H) 0.44 - 1.00 mg/dL   Calcium 9.0 8.9 - 09.6 mg/dL   Total Protein 9.0 (H) 6.5 - 8.1 g/dL   Albumin 3.8 3.5 - 5.0 g/dL   AST 26 15 - 41 U/L   ALT 15 0 - 44 U/L   Alkaline Phosphatase 107 38 - 126 U/L   Total Bilirubin 0.8 0.3 - 1.2 mg/dL   GFR, Estimated 26 (L) >60 mL/min    Comment: (NOTE) Calculated using the CKD-EPI Creatinine Equation (2021)    Anion gap 14 5 - 15    Comment: Performed at [facility], [address], [place], [place] [address]  CBC  Status: None   Collection Time: 01/21/23  3:18 PM  Result Value Ref Range   WBC 6.7 4.0 - 10.5 K/uL   RBC 4.45 3.87 - 5.11 MIL/uL   Hemoglobin 12.7 12.0 - 15.0 g/dL   HCT 91.4 78.2 - 95.6 %   MCV 87.0 80.0 - 100.0 fL   MCH 28.5 26.0 - 34.0 pg   MCHC 32.8 30.0 - 36.0 g/dL   RDW 21.3 08.6 - 57.8 %   Platelets 311 150 - 400 K/uL   nRBC 0.0 0.0 - 0.2 %    Comment: Performed at [facility], [address] [place], [place] [address]  Protime-INR     Status: None   Collection Time: 01/21/23  3:20 PM  Result Value Ref Range   Prothrombin Time 13.9 11.4 - 15.2 seconds   INR 1.1 0.8 - 1.2    Comment: (NOTE) INR goal varies based on device and disease states. Performed at [facility], [address] [place], [place] [address]   APTT     Status: None   Collection Time: 01/21/23  3:20 PM  Result Value Ref Range   aPTT 29 24 - 36 seconds    Comment: Performed at [facility], [address] [place], [place] [address]  Lactic acid, plasma     Status: Abnormal   Collection Time: 01/21/23  3:33 PM  Result Value Ref Range   Lactic Acid, Venous 2.6 (HH) 0.5 - 1.9 mmol/L    Comment: CRITICAL RESULT CALLED TO, READ BACK BY AND VERIFIED WITH THERON RIRIE 01/21/23 @ 1624 BY SB Performed at [facility], [address]  [place], [place] [address]   Urinalysis, w/ Reflex to Culture (Infection Suspected) -Urine, Catheterized     Status: Abnormal   Collection Time: 01/21/23  3:33 PM  Result Value Ref Range   Specimen Source URINE, CATHETERIZED    Color, Urine YELLOW (A) YELLOW   APPearance CLEAR (A) CLEAR   Specific Gravity, Urine 1.025 1.005 - 1.030   pH 5.5 5.0 - 8.0   Glucose, UA NEGATIVE NEGATIVE mg/dL   Hgb urine dipstick TRACE (A) NEGATIVE   Bilirubin Urine NEGATIVE NEGATIVE   Ketones, ur NEGATIVE NEGATIVE mg/dL   Protein, ur 253 (A) NEGATIVE mg/dL   Nitrite NEGATIVE NEGATIVE   Leukocytes,Ua NEGATIVE NEGATIVE   RBC / HPF 0-5 0 - 5 RBC/hpf   WBC, UA 0-5 0 - 5 WBC/hpf    Comment:        Reflex urine culture not performed if WBC <=10, OR if Squamous epithelial cells >5. If Squamous epithelial cells >5 suggest recollection.    Bacteria, UA MANY (A) NONE SEEN   Squamous Epithelial / HPF 0-5 0 - 5 /HPF   Mucus PRESENT    Amorphous Crystal PRESENT     Comment: Performed at [facility], [address] [place], [place] [address]  Lactic acid, plasma     Status: None   Collection Time: 01/21/23  6:08 PM  Result Value Ref Range   Lactic Acid, Venous 0.8 0.5 - 1.9 mmol/L    Comment: Performed at [facility], [address] [place], [place] [address]    Lab Results  Component Value Date   CREATININE 2.09 (H) 01/21/2023   CREATININE 1.62 (H) 01/04/2023   CREATININE 1.64 (H) 01/03/2023      Latest Ref Rng & Units 01/21/2023    3:18 PM 01/04/2023    8:19 AM 01/03/2023   11:46  AM  CMP  Glucose 70 - 99 mg/dL 161  78  92   BUN 8 - 23 mg/dL 22  13  13    Creatinine 0.44 - 1.00 mg/dL 0.96  0.45  4.09   Sodium 135 - 145 mmol/L 138  142  140   Potassium 3.5 - 5.1 mmol/L 3.6  3.4  3.5   Chloride 98 - 111 mmol/L 102  114  110   CO2 22 - 32 mmol/L 22  20  22    Calcium 8.9 - 10.3 mg/dL 9.0  8.1  8.5   Total Protein 6.5 - 8.1 g/dL 9.0     Total Bilirubin 0.3 - 1.2 mg/dL 0.8      Alkaline Phos 38 - 126 U/L 107     AST 15 - 41 U/L 26     ALT 0 - 44 U/L 15      Unresulted Labs (From admission, onward)     Start     Ordered   01/22/23 0500  Comprehensive metabolic panel  Tomorrow morning,   R        01/22/23 0114   01/22/23 0500  CBC  Tomorrow morning,   R        01/22/23 0114   01/21/23 1529  Blood Culture (routine x 2)  (Undifferentiated presentation (screening labs and basic nursing orders))  BLOOD CULTURE X 2,   STAT      01/21/23 1529          Pt has received : Orders Placed This Encounter  Procedures   Blood Culture (routine x 2)    Standing Status:   Standing    Number of Occurrences:   2   CT ABDOMEN PELVIS WO CONTRAST    Standing Status:   Standing    Number of Occurrences:   1    Order Specific Question:   If indicated for the ordered procedure, I authorize the administration of oral contrast media per Radiology protocol    Answer:   Yes    Order Specific Question:   Does the patient have a contrast media/X-ray dye allergy?    Answer:   No   CT Head Wo Contrast    Standing Status:   Standing    Number of Occurrences:   1   Comprehensive metabolic panel    Standing Status:   Standing    Number of Occurrences:   1   CBC    Standing Status:   Standing    Number of Occurrences:   1   Lactic acid, plasma    Standing Status:   Standing    Number of Occurrences:   2   Urinalysis, w/ Reflex to Culture (Infection Suspected) -Urine, Catheterized    Standing Status:   Standing    Number of Occurrences:   1    Order Specific Question:   Specimen Source    Answer:   Urine, Catheterized [36]   Lactic acid, plasma    Standing Status:   Standing    Number of Occurrences:   1   Protime-INR    Standing Status:   Standing    Number of Occurrences:   1   APTT    Standing Status:   Standing    Number of Occurrences:   1   Comprehensive metabolic panel    Standing Status:   Standing    Number of Occurrences:   1   CBC    Standing Status:  Standing    Number of Occurrences:   1   Diet NPO time specified    Standing Status:   Standing    Number of Occurrences:   1   Document Height and Actual Weight    Use scales to weigh patient, not stated or estimated weight.    Standing Status:   Standing    Number of Occurrences:   1   Assess and Document Glasgow Coma Scale    Standing Status:   Standing    Number of Occurrences:   1   Document vital signs within 1-hour of fluid bolus completion.  Notify provider of abnormal vital signs despite fluid resuscitation.    Standing Status:   Standing    Number of Occurrences:   1   Refer to Sidebar Report: Sepsis Bundle ED/IP    Sepsis Bundle ED/IP    Standing Status:   Standing    Number of Occurrences:   1   Notify provider for difficulties obtaining IV access    Standing Status:   Standing    Number of Occurrences:   1   Initiate Carrier Fluid Protocol    Standing Status:   Standing    Number of Occurrences:   1   In and Out Cath    Standing Status:   Standing    Number of Occurrences:   1   DO NOT delay antibiotics if unable to obtain blood culture.    Standing Status:   Standing    Number of Occurrences:   1   Vital signs    Standing Status:   Standing    Number of Occurrences:   1   Notify physician (specify)    Standing Status:   Standing    Number of Occurrences:   1    Order Specific Question:   Notify Physician    Answer:   T>38.3Celsius    Order Specific Question:   Notify Physician    Answer:   HR<50bpm or >120bpm    Order Specific Question:   Notify Physician    Answer:   RR<10 or >28    Order Specific Question:   Notify Physician    Answer:   SBP<90 or >128mmHg    Order Specific Question:   Notify Physician    Answer:   DBP<50 or >185mmHg    Order Specific Question:   Notify Physician    Answer:   O2 Sat <93%   Initiate Carrier Fluid Protocol    Standing Status:   Standing    Number of Occurrences:   1   Pharmacy Tech to prioritize PTA Med Rec     Standing Status:   Standing    Number of Occurrences:   1   Activity as tolerated    Standing Status:   Standing    Number of Occurrences:   1   Maintain IV access    Standing Status:   Standing    Number of Occurrences:   1   Vital signs    Standing Status:   Standing    Number of Occurrences:   1   Notify physician (specify)    Standing Status:   Standing    Number of Occurrences:   20    Order Specific Question:   Notify Physician    Answer:   for pulse less than 55 or greater than 120    Order Specific Question:   Notify Physician    Answer:   for  respiratory rate less than 12 or greater than 25    Order Specific Question:   Notify Physician    Answer:   for temperature greater than 100.5 F    Order Specific Question:   Notify Physician    Answer:   for urinary output less than 30 mL/hr for four hours    Order Specific Question:   Notify Physician    Answer:   for systolic BP less than 90 or greater than 160, diastolic BP less than 60 or greater than 100    Order Specific Question:   Notify Physician    Answer:   for new hypoxia w/ oxygen saturations < 88%   Progressive Mobility Protocol: No Restrictions    Standing Status:   Standing    Number of Occurrences:   1   Daily weights    Standing Status:   Standing    Number of Occurrences:   1   Intake and Output    Standing Status:   Standing    Number of Occurrences:   1   Do not place and if present remove PureWick    Standing Status:   Standing    Number of Occurrences:   1   Initiate Oral Care Protocol    Standing Status:   Standing    Number of Occurrences:   1   Initiate Carrier Fluid Protocol    Standing Status:   Standing    Number of Occurrences:   1   RN may order General Admission PRN Orders utilizing "General Admission PRN medications" (through manage orders) for the following patient needs: allergy symptoms (Claritin), cold sores (Carmex), cough (Robitussin DM), eye irritation (Liquifilm Tears), hemorrhoids  (Tucks), indigestion (Maalox), minor skin irritation (Hydrocortisone Cream), muscle pain (Ben Gay), nose irritation (saline nasal spray) and sore throat (Chloraseptic spray).    Standing Status:   Standing    Number of Occurrences:   [id number]   Cardiac Monitoring Continuous x 48 hours Indications for use: Other; Other indications for use: sepsis    Standing Status:   Standing    Number of Occurrences:   1    Order Specific Question:   Indications for use:    Answer:   Other    Order Specific Question:   Other indications for use:    Answer:   sepsis   Swallow screen    Standing Status:   Standing    Number of Occurrences:   1   Full code    Standing Status:   Standing    Number of Occurrences:   1    Order Specific Question:   By:    Answer:   Other   Code Sepsis activation.  This occurs automatically when order is signed and prioritizes pharmacy, lab, and radiology services for STAT collections and interventions.  If [facility] downtime, call [facility] [phone]) to activate Code Sepsis.    Standing Status:   Standing    Number of Occurrences:   1    Order Specific Question:   Initiate "Code Sepsis" tracking    Answer:   Yes    Order Specific Question:   Contact [facility] ([phone])    Answer:   No    Order Specific Question:   Reason for Consult?    Answer:   tracking   pharmacy consult    Standing Status:   Standing    Number of Occurrences:   1    Order Specific Question:   Reason for  Consult (*indicate specifics in comment field)    Answer:   Other (see comment)    Order Specific Question:   Comment:    Answer:   Treatment for SEPSIS has been initiated. Follow patient to expedite timely antibiotic and fluid administration.   Consult to Transition of Care Team    Standing Status:   Standing    Number of Occurrences:   1    Order Specific Question:   Reason for Consult:    Answer:   SNF placement   Consult to hospitalist    Standing Status:   Standing    Number of Occurrences:    1    Order Specific Question:   Place call to:    Answer:   [id number]    Order Specific Question:   Reason for Consult    Answer:   Admit   OT eval and treat    Standing Status:   Standing    Number of Occurrences:   1   PT eval and treat    Standing Status:   Standing    Number of Occurrences:   1   Pulse oximetry check with vital signs    Standing Status:   Standing    Number of Occurrences:   1   Oxygen therapy Mode or (Route): Nasal cannula; Liters Per Minute: 2; Keep 02 saturation: greater than 92 %    Standing Status:   Standing    Number of Occurrences:   20    Order Specific Question:   Mode or (Route)    Answer:   Nasal cannula    Order Specific Question:   Liters Per Minute    Answer:   2    Order Specific Question:   Keep 02 saturation    Answer:   greater than 92 %   CBG monitoring, ED    Standing Status:   Standing    Number of Occurrences:   1   EKG 12-Lead    Standing Status:   Standing    Number of Occurrences:   1   ED EKG 12-Lead    Standing Status:   Standing    Number of Occurrences:   1    Order Specific Question:   Notes    Answer:   Baseline   EKG 12-Lead    Standing Status:   Standing    Number of Occurrences:   1   Insert peripheral IV X 1    Angiocath size 20G or larger    Standing Status:   Standing    Number of Occurrences:   1   Insert 2nd peripheral IV if not already present.    Standing Status:   Standing    Number of Occurrences:   20   Admit to Inpatient (patient's expected length of stay will be greater than 2 midnights or inpatient only procedure)    Standing Status:   Standing    Number of Occurrences:   1    Order Specific Question:   Hospital Area    Answer:   [facility] [[id number]]    Order Specific Question:   Level of Care    Answer:   Med-Surg [16]    Order Specific Question:   Covid Evaluation    Answer:   Asymptomatic - no recent exposure (last 10 days) testing not required    Order Specific Question:    Diagnosis    Answer:   Abdominal pain [956213]  Order Specific Question:   Admitting Physician    Answer:   Darrold Junker    Order Specific Question:   Attending Physician    Answer:   Darrold Junker    Order Specific Question:   Certification:    Answer:   I certify this patient will need inpatient services for at least 2 midnights    Order Specific Question:   Estimated Length of Stay    Answer:   2   Aspiration precautions    Standing Status:   Standing    Number of Occurrences:   1   Seizure precautions    Standing Status:   Standing    Number of Occurrences:   1   Fall precautions    Standing Status:   Standing    Number of Occurrences:   1   Place in ED Boarder    Standing Status:   Standing    Number of Occurrences:   1    Meds ordered this encounter  Medications   sodium chloride 0.9 % bolus 500 mL   fentaNYL (SUBLIMAZE) injection 50 mcg   lactated ringers infusion   levofloxacin (LEVAQUIN) IVPB 750 mg    Order Specific Question:   Antibiotic Indication:    Answer:   Intra-abdominal Infection   metroNIDAZOLE (FLAGYL) IVPB 500 mg    Order Specific Question:   Antibiotic Indication:    Answer:   Intra-abdominal Infection   sodium chloride 0.9 % bolus 1,000 mL   pramoxine-hydrocortisone foam 1 applicator   DISCONTD: acetaminophen (TYLENOL) tablet 650 mg   amLODipine (NORVASC) tablet 10 mg   aspirin EC tablet 81 mg    Swallow whole.     clopidogrel (PLAVIX) tablet 75 mg   hydrALAZINE (APRESOLINE) tablet 25 mg   levETIRAcetam (KEPPRA) tablet 250 mg   rosuvastatin (CRESTOR) tablet 20 mg   polyethylene glycol (MIRALAX / GLYCOLAX) packet 17 g   docusate sodium (COLACE) capsule 200 mg   lactulose (CHRONULAC) 10 GM/15ML solution 10 g   cefTRIAXone (ROCEPHIN) 1 g in sodium chloride 0.9 % 100 mL IVPB    Order Specific Question:   Antibiotic Indication:    Answer:   Intra-abdominal    Order Specific Question:   Other Indication:    Answer:   pt has  taken cephalosporin past. verified with pharmacist.   heparin injection 5,000 Units   sodium chloride flush (NS) 0.9 % injection 3 mL   OR Linked Order Group    acetaminophen (TYLENOL) tablet 650 mg    acetaminophen (TYLENOL) suppository 650 mg   lactated ringers infusion   hydrALAZINE (APRESOLINE) injection 5 mg    Admission Imaging : CT ABDOMEN PELVIS WO CONTRAST  Result Date: 01/21/2023 CLINICAL DATA:  Left lower quadrant pain. EXAM: CT ABDOMEN AND PELVIS WITHOUT CONTRAST TECHNIQUE: Multidetector CT imaging of the abdomen and pelvis was performed following the standard protocol without IV contrast. RADIATION DOSE REDUCTION: This exam was performed according to the departmental dose-optimization program which includes automated exposure control, adjustment of the mA and/or kV according to patient size and/or use of iterative reconstruction technique. COMPARISON:  CT abdomen pelvis 11/21/2022 FINDINGS: Lower chest: No acute abnormality. Evaluation of the abdominal viscera limited by the lack of IV contrast. Hepatobiliary: Lobulated contour suggesting cirrhosis. No new focal liver lesion. Normal gallbladder. Pancreas: Generalized atrophy. Spleen: Normal in size without focal abnormality. Adrenals/Urinary Tract: Adrenal glands are unremarkable. No renal calculi or hydronephrosis. Unremarkable urinary bladder. Stomach/Bowel: Stomach is  within normal limits. Appendix appears normal. There is diffuse wall thickening in the rectum with adjacent mild fat stranding. No evidence of obstruction. Scattered colonic diverticula. Vascular/Lymphatic: Aortic atherosclerosis. No enlarged abdominal or pelvic lymph nodes. Reproductive: Status post hysterectomy. No adnexal masses. Other: No abdominal wall hernia or abnormality. No abdominopelvic ascites. Musculoskeletal: No acute or significant osseous findings. IMPRESSION: 1. Diffuse wall thickening in the rectum with adjacent mild fat stranding as can be seen with  proctitis. 2. No CT finding to explain the patient's left lower quadrant pain. 3. Lobulated contour of the liver suggesting cirrhosis. Aortic Atherosclerosis (ICD10-I70.0). Electronically Signed   By: Emmaline Kluver M.D.   On: 01/21/2023 17:46   CT Head Wo Contrast  Result Date: 01/21/2023 CLINICAL DATA:  Mental status change EXAM: CT HEAD WITHOUT CONTRAST TECHNIQUE: Contiguous axial images were obtained from the base of the skull through the vertex without intravenous contrast. RADIATION DOSE REDUCTION: This exam was performed according to the departmental dose-optimization program which includes automated exposure control, adjustment of the mA and/or kV according to patient size and/or use of iterative reconstruction technique. COMPARISON:  CT of the brain December 31, 2022 FINDINGS: Brain: A large infarct involving the left occipital lobe extending into the left temporal lobe is stable. White matter changes are noted. No acute ischemia noted. No mass identified. No subdural, epidural, or subarachnoid hemorrhage is identified. Vascular: No hyperdense vessel or unexpected calcification. Skull: Normal. Negative for fracture or focal lesion. Sinuses/Orbits: No acute finding. Other: None. IMPRESSION: 1. Old left occipital and temporal lobe infarct. 2. No acute abnormalities. 3. White matter changes. Electronically Signed   By: Gerome Sam III M.D.   On: 01/21/2023 17:40   Physical Examination: Vitals:   01/22/23 0030 01/22/23 0100 01/22/23 0130 01/22/23 0200  BP: (!) 153/78 (!) 152/76 137/81 (!) 151/103  Pulse:      Temp:      Resp: 19 16    Height:      Weight:      SpO2:      TempSrc:      BMI (Calculated):       Physical Exam Vitals reviewed.  Constitutional:      General: She is not in acute distress. HENT:     Head: Normocephalic and atraumatic.     Mouth/Throat:     Mouth: Mucous membranes are dry.  Eyes:     Extraocular Movements: Extraocular movements intact.     Pupils: Pupils  are equal, round, and reactive to light.  Cardiovascular:     Rate and Rhythm: Normal rate and regular rhythm.     Pulses: Normal pulses.     Heart sounds: Normal heart sounds.  Pulmonary:     Effort: Pulmonary effort is normal.     Breath sounds: Normal breath sounds.  Abdominal:     General: Bowel sounds are normal. There is no distension.     Palpations: Abdomen is soft.     Tenderness: There is no abdominal tenderness. There is no guarding.  Skin:    General: Skin is dry.  Neurological:     General: No focal deficit present.     Mental Status: She is alert. She is disoriented.     Assessment and Plan: * Abdominal pain Pt coming in with abdominal pain 2/2 proctitis  and constipation.  We will cont with bowel regimen and stool softener. PRN tylenol. We will try proctofoam.       Stroke [facility]) History of recent stroke on  MRI placed in April 2024. Will continue patient on aspirin 81 mg. Patient was started on dual antiplatelet therapy during her stroke admission. Suspect patient has completed 21 days of DAPT and is currently on aspirin alone. Will wait for pharmacy to verify this.  Pt on exam is Alert/oriented and has mild weakness in LLE.  Otherwise nonfocal. Pt will benefit from additional rehab will get PT to eval.   Acute renal failure superimposed on stage 3b chronic kidney disease (HCC)    Latest Ref Rng & Units 01/21/2023    3:18 PM 01/04/2023    8:19 AM 01/03/2023   11:46 AM  CMP  Glucose 70 - 99 mg/dL 161  78  92   BUN 8 - 23 mg/dL 22  13  13    Creatinine 0.44 - 1.00 mg/dL 0.96  0.45  4.09   Sodium 135 - 145 mmol/L 138  142  140   Potassium 3.5 - 5.1 mmol/L 3.6  3.4  3.5   Chloride 98 - 111 mmol/L 102  114  110   CO2 22 - 32 mmol/L 22  20  22    Calcium 8.9 - 10.3 mg/dL 9.0  8.1  8.5   Total Protein 6.5 - 8.1 g/dL 9.0     Total Bilirubin 0.3 - 1.2 mg/dL 0.8     Alkaline Phos 38 - 126 U/L 107     AST 15 - 41 U/L 26     ALT 0 - 44 U/L 15     Mild AKI  on CKD stage IIIb: Will continue with gentle IV fluid hydration. Strict I's and O's. Avoiding contrast. Renally dosing IV antibiotics and medications. Nephrology consult as deemed appropriate  Acute proctitis Patient presenting with perirectal pain and acute proctitis on CT scan. Patient meets sepsis criteria we will therefore continue IV antibiotic regimen, pt has had rocephin in past.  Cont flagyl and rocephin.  Will start with Proctofoam PRN. GI consult as deemed needed.    GERD without esophagitis IV PPI therapy.   Seizure disorder (HCC) Seizure precautions. Continue patient on Keppra 250 mg twice daily.   Essential hypertension Vitals:   01/21/23 1456 01/21/23 1800 01/21/23 1915 01/21/23 1930  BP: (!) 174/82 (!) 164/135 (!) 160/77 (!) 151/71   01/21/23 2000 01/21/23 2030 01/21/23 2100 01/21/23 2130  BP: (!) 156/70 (!) 142/73 (!) 155/71 (!) 162/85   01/21/23 2200 01/21/23 2230 01/21/23 2330 01/22/23 0000  BP: (!) 160/84 (!) 140/66 139/70 (!) 150/75  Blood pressures elevated and will continue home regimen of amlodipine and hydralazine. Pain control. Lab Results  Component Value Date   CREATININE 2.09 (H) 01/21/2023   CREATININE 1.62 (H) 01/04/2023   CREATININE 1.64 (H) 01/03/2023  Will consider beta-blocker therapy at low-dose if additional agent is needed.  Or we can increase the patient's hydralazine dose.    Fall Patient is a high Fall risk due to history of stroke and deconditioning. Will place patient on fall precautions.  Chronic diastolic CHF (congestive heart failure) (HCC) Stable compensated we will continue patient on aspirin hydralazine Crestor therapy. With Daily weights strict I's and O's. EKG today shows:  Most recent 2D echocardiogram in February 2024 shows: 1. Left ventricular ejection fraction, by estimation, is 60 to 65%. The  left ventricle has normal function. The left ventricle has no regional  wall motion abnormalities. Left ventricular  diastolic parameters are  consistent with Grade I diastolic  dysfunction (impaired relaxation).   2. Right ventricular systolic function is normal.  The right ventricular  size is not well visualized. Tricuspid regurgitation signal is inadequate  for assessing PA pressure.   3. No evidence of mitral valve regurgitation.   4. The aortic valve is tricuspid. Aortic valve regurgitation is not  visualized.   5. The inferior vena cava is normal in size with greater than 50%  respiratory variability, suggesting right atrial pressure of 3 mmHg.     Stage 3b chronic kidney disease (CKD) (HCC)    Type II diabetes mellitus with renal manifestations (HCC) Home medication regimen includes Tresiba and NovoLog. With the patient's current decreased p.o. intake , will obtain a swallow study. Will start patient on glycemic protocol. .   DVT prophylaxis:  Heparin   Code Status:  Full Code.     01/21/2023    2:54 PM  Advanced Directives  Does Patient Have a Medical Advance Directive? No    Family Communication:  None. Emergency Contact: Contact Information     Name Relation Home Work Mobile   Day,Robert Spouse [phone]     Ovella, Nakada Daughter   [phone]       Disposition Plan:  TBD.  Consults: None. Admission status: Inpatient.   Unit / Expected LOS: Med tele/ 2 days   Gertha Calkin MD [facility]  6 PM- 2 AM. [phone]( Pager )  For questions regarding this patient please use [email or web address] to contact the current [facility] MD.   Bonita Quin may also call [phone] to contact current Assigned [facility] Attending/Consulting MD for this patient.

## 2023-01-22 NOTE — Evaluation (Signed)
Physical Therapy Evaluation Patient Details Name: [name] Gilbert MRN: [id number] DOB: 03-19-1961 Today's Date: 01/22/2023  History of Present Illness  Patient is a 62 year old female presenting with perirectal pain and acute proctitis. History of CVA. Patient coming from a facility  Clinical Impression  Patient is agreeable to PT. Patient states she required assistance for mobility and ADLs. She was not getting to the chair everyday per patient report.  Today the patient required Max A for bed mobility. One standing bout performed from bed with +2 person assistance using rolling walker with standing tolerance of ~ 20 seconds. Patient fatigues quickly with activity and required frequent rest breaks with activity. +2 person assistance required for stand pivot to the recliner chair. Encouraged routine OOB to chair with staff assistance to promote upright conditioning and general strengthening. Recommend PT follow up to maximize independence and to decrease caregiver burden.      Recommendations for follow up therapy are one component of a multi-disciplinary discharge planning process, led by the attending physician.  Recommendations may be updated based on patient status, additional functional criteria and insurance authorization.  Follow Up Recommendations Can patient physically be transported by private vehicle: No     Assistance Recommended at Discharge Frequent or constant Supervision/Assistance  Patient can return home with the following  Two people to help with walking and/or transfers;A lot of help with bathing/dressing/bathroom;Assist for transportation;Help with stairs or ramp for entrance;Direct supervision/assist for medications management;Direct supervision/assist for financial management;Assistance with cooking/housework    Equipment Recommendations None recommended by PT (to be determined at next venue of care)  Recommendations for Other Services       Functional Status  Assessment Patient has had a recent decline in their functional status and demonstrates the ability to make significant improvements in function in a reasonable and predictable amount of time.     Precautions / Restrictions Precautions Precautions: Fall Restrictions Weight Bearing Restrictions: No      Mobility  Bed Mobility Overal bed mobility: Needs Assistance Bed Mobility: Supine to Sit, Sit to Supine     Supine to sit: Max assist     General bed mobility comments: assistance for BLE and trunk support. cues for task initiation    Transfers Overall transfer level: Needs assistance Equipment used: Rolling walker (2 wheels) Transfers: Sit to/from Stand, Bed to chair/wheelchair/BSC Sit to Stand: Max assist, +2 physical assistance Stand pivot transfers: Max assist, +2 physical assistance         General transfer comment: one standing bout performed from bed with rolling walker, +2 assistance required. cues for technique. after sitting rest break, patient performed stand pivot from bed to wheelchair without device, requiring +2 person assistance. patient is fatigued with activity    Ambulation/Gait               General Gait Details: not attempted due to poor standing tolerance, fatigue with minimal activity  Stairs            Wheelchair Mobility    Modified Rankin (Stroke Patients Only)       Balance Overall balance assessment: Needs assistance Sitting-balance support: Feet supported Sitting balance-Leahy Scale: Fair Sitting balance - Comments: close supervision to stand by assistance provided. mildly lightheaded with sitting upright   Standing balance support: Bilateral upper extremity supported, Reliant on assistive device for balance Standing balance-Leahy Scale: Poor Standing balance comment: external support required to maintain standing balance with standing tolerance of ~ 20 seconds  Pertinent  Vitals/Pain Pain Assessment Pain Assessment: No/denies pain    Home Living Family/patient expects to be discharged to:: Skilled nursing facility                        Prior Function Prior Level of Function : Needs assist             Mobility Comments: patient reports she requires assistance for getting up to chair. does not get up daily at the facility per her report. unclear if she was having PT ADLs Comments: assistance required     Hand Dominance   Dominant Hand: Right    Extremity/Trunk Assessment   Upper Extremity Assessment Upper Extremity Assessment: Generalized weakness;Defer to OT evaluation    Lower Extremity Assessment Lower Extremity Assessment: Generalized weakness (unable to follow mulit step commands for MMT. generalized weakness throughout)       Communication   Communication: Expressive difficulties  Cognition Arousal/Alertness: Awake/alert Behavior During Therapy: Flat affect Overall Cognitive Status: No family/caregiver present to determine baseline cognitive functioning                                 General Comments: increased time and effort required for functional mobility tasks. unable to state her birthdate. she is able to state she is in the hospital        General Comments General comments (skin integrity, edema, etc.): heart rate up to 115bpm with activity    Exercises     Assessment/Plan    PT Assessment Patient needs continued PT services  PT Problem List Decreased strength;Decreased range of motion;Decreased activity tolerance;Decreased balance;Decreased mobility;Decreased safety awareness;Decreased cognition       PT Treatment Interventions DME instruction;Gait training;Stair training;Functional mobility training;Therapeutic activities;Therapeutic exercise;Balance training;Neuromuscular re-education;Cognitive remediation;Patient/family education;Wheelchair mobility training    PT Goals (Current goals  can be found in the Care Plan section)  Acute Rehab PT Goals Patient Stated Goal: to feel better PT Goal Formulation: With patient Time For Goal Achievement: 02/05/23 Potential to Achieve Goals: Fair    Frequency Min 2X/week     Co-evaluation PT/OT/SLP Co-Evaluation/Treatment: Yes Reason for Co-Treatment: To address functional/ADL transfers PT goals addressed during session: Mobility/safety with mobility         AM-PAC PT "6 Clicks" Mobility  Outcome Measure Help needed turning from your back to your side while in a flat bed without using bedrails?: A Lot Help needed moving from lying on your back to sitting on the side of a flat bed without using bedrails?: A Lot Help needed moving to and from a bed to a chair (including a wheelchair)?: Total Help needed standing up from a chair using your arms (e.g., wheelchair or bedside chair)?: Total Help needed to walk in hospital room?: Total Help needed climbing 3-5 steps with a railing? : Total 6 Click Score: 8    End of Session   Activity Tolerance: Patient limited by fatigue Patient left: in chair;with call bell/phone within reach;with chair alarm set Nurse Communication: Mobility status PT Visit Diagnosis: Unsteadiness on feet (R26.81);Muscle weakness (generalized) (M62.81)    Time: 1610-9604 PT Time Calculation (min) (ACUTE ONLY): 24 min   Charges:   PT Evaluation $PT Eval Low Complexity: 1 Low          Donna Bernard, PT, MPT   Ina Homes 01/22/2023, 9:15 AM

## 2023-01-22 NOTE — Assessment & Plan Note (Signed)
Increase MiraLAX to twice a day dosing.  Start senna.  As needed Dulcolax suppository

## 2023-01-22 NOTE — Assessment & Plan Note (Addendum)
Creatinine 2.09 on presentation down to 1.78.  Baseline creatinine around 1.62.  Gentle IV fluids today.

## 2023-01-22 NOTE — Evaluation (Signed)
Occupational Therapy Evaluation Patient Details Name: [name] Gilbert MRN: [id number] DOB: 06-Jan-1961 Today's Date: 01/22/2023   History of Present Illness Patient is a 62 year old female presenting with perirectal pain and acute proctitis. History of CVA. Patient coming from a facility   Clinical Impression   Patient agreeable to OT/PT co-treatment to maximize safety and participation. Pt presenting with decreased independence in self care, balance, functional mobility/transfers, endurance, and safety awareness. PTA pt required assistance for all ADLs, IADLs, and functional mobility. Pt currently functioning at Max A +1 for supine to sit, Max A +2 for STS from EOB using RW, and Max A +2 for stand pivot from EOB>recliner. Pt found to have soiled linens due to urinary incontinence and required Max A to don underwear. Pt reports wearing briefs at baseline. Pt will benefit from skilled acute OT services to address deficits noted below. OT recommends ongoing therapy upon discharge to maximize safety and independence with ADLs, decrease fall risk, decrease caregiver burden, and promote return to PLOF.      Recommendations for follow up therapy are one component of a multi-disciplinary discharge planning process, led by the attending physician.  Recommendations may be updated based on patient status, additional functional criteria and insurance authorization.   Assistance Recommended at Discharge Frequent or constant Supervision/Assistance  Patient can return home with the following Two people to help with walking and/or transfers;Two people to help with bathing/dressing/bathroom;Assistance with cooking/housework;Help with stairs or ramp for entrance;Assist for transportation;Direct supervision/assist for medications management    Functional Status Assessment  Patient has had a recent decline in their functional status and demonstrates the ability to make significant improvements in function in a  reasonable and predictable amount of time.  Equipment Recommendations  Other (comment) (defer to next venue of care)    Recommendations for Other Services       Precautions / Restrictions Precautions Precautions: Fall Restrictions Weight Bearing Restrictions: No      Mobility Bed Mobility Overal bed mobility: Needs Assistance Bed Mobility: Supine to Sit     Supine to sit: Max assist          Transfers Overall transfer level: Needs assistance Equipment used: Rolling walker (2 wheels), None Transfers: Sit to/from Stand, Bed to chair/wheelchair/BSC Sit to Stand: Max assist, +2 physical assistance Stand pivot transfers: Max assist, +2 physical assistance         General transfer comment: STS from EOB with Max A +2 using RW, stand pivot from EOB>recliner with Max A +2 and no AD      Balance Overall balance assessment: Needs assistance Sitting-balance support: Feet supported Sitting balance-Leahy Scale: Fair Sitting balance - Comments: close supervision to stand by assistance provided. mildly lightheaded with sitting upright   Standing balance support: Bilateral upper extremity supported, Reliant on assistive device for balance Standing balance-Leahy Scale: Poor       ADL either performed or assessed with clinical judgement   ADL Overall ADL's : Needs assistance/impaired       Lower Body Dressing: Maximal assistance;Sitting/lateral leans;Sit to/from stand Lower Body Dressing Details (indicate cue type and reason): Max A to thread B feet through underwear then to hike underwear in standing  Toilet Transfer: Stand-pivot;Maximal assistance;+2 for physical assistance Toilet Transfer Details (indicate cue type and reason): simulated Toileting- Clothing Manipulation and Hygiene: Maximal assistance;Sit to/from stand               Vision Patient Visual Report: No change from baseline  Perception     Praxis      Pertinent Vitals/Pain Pain  Assessment Pain Assessment: Faces Faces Pain Scale: Hurts a little bit Pain Location: abdomen Pain Descriptors / Indicators: Discomfort, Sore Pain Intervention(s): Limited activity within patient's tolerance, Monitored during session, Repositioned     Hand Dominance Right   Extremity/Trunk Assessment Upper Extremity Assessment Upper Extremity Assessment: Generalized weakness   Lower Extremity Assessment Lower Extremity Assessment: Generalized weakness       Communication Communication Communication: Expressive difficulties   Cognition Arousal/Alertness: Awake/alert Behavior During Therapy: Flat affect Overall Cognitive Status: No family/caregiver present to determine baseline cognitive functioning       General Comments: delayed processing, unable to state her DOB or how old she is, oriented to being in the hospital     General Comments  HR 110s with activity    Exercises Other Exercises Other Exercises: OT provided education re: role of OT, OT POC, post acute recs, sitting up for all meals, EOB/OOB mobility with assistance, home/fall safety.     Shoulder Instructions      Home Living Family/patient expects to be discharged to:: Skilled nursing facility Living Arrangements: Spouse/significant other Available Help at Discharge: Family;Available PRN/intermittently Type of Home: House Home Access: Ramped entrance     Home Layout: One level     Bathroom Shower/Tub: Chief Strategy Officer: Standard Bathroom Accessibility: Yes How Accessible: Accessible via walker Home Equipment: Rolling Walker (2 wheels);Wheelchair - manual          Prior Functioning/Environment Prior Level of Function : Needs assist             Mobility Comments: patient reports she requires assistance for getting up to chair. does not get up daily at the facility per her report. unclear if she was having PT ADLs Comments: Pt at facility PTA. Pt with limited ability to  provide complete history, according to previous OT eval in March 2024 pt receives assist for bed baths, dressing, toileting, and all IADLs. Pt reports wearing briefs at baseline        OT Problem List: Decreased strength;Decreased range of motion;Decreased activity tolerance;Impaired balance (sitting and/or standing);Decreased safety awareness;Decreased cognition;Pain      OT Treatment/Interventions: Self-care/ADL training;Therapeutic exercise;Therapeutic activities;Patient/family education;Balance training;Energy conservation;DME and/or AE instruction    OT Goals(Current goals can be found in the care plan section) Acute Rehab OT Goals Patient Stated Goal: to feel better OT Goal Formulation: With patient Time For Goal Achievement: 02/05/23 Potential to Achieve Goals: Fair   OT Frequency: Min 1X/week    Co-evaluation PT/OT/SLP Co-Evaluation/Treatment: Yes Reason for Co-Treatment: To address functional/ADL transfers;For patient/therapist safety PT goals addressed during session: Mobility/safety with mobility;Balance;Proper use of DME OT goals addressed during session: ADL's and self-care;Proper use of Adaptive equipment and DME      AM-PAC OT "6 Clicks" Daily Activity     Outcome Measure Help from another person eating meals?: A Lot Help from another person taking care of personal grooming?: A Lot Help from another person toileting, which includes using toliet, bedpan, or urinal?: Total Help from another person bathing (including washing, rinsing, drying)?: Total Help from another person to put on and taking off regular upper body clothing?: A Lot Help from another person to put on and taking off regular lower body clothing?: A Lot 6 Click Score: 10   End of Session Equipment Utilized During Treatment: Gait belt;Rolling walker (2 wheels) Nurse Communication: Mobility status  Activity Tolerance: Patient limited by fatigue Patient left:  in chair;with call bell/phone within  reach;with chair alarm set  OT Visit Diagnosis: Unsteadiness on feet (R26.81);Muscle weakness (generalized) (M62.81);Pain Pain - part of body:  (abdomen)                Time: 6578-4696 OT Time Calculation (min): 22 min Charges:  OT General Charges $OT Visit: 1 Visit OT Evaluation $OT Eval Low Complexity: 1 Low  [facility] MS, OTR/L ascom [phone]  01/22/23, 12:31 PM

## 2023-01-22 NOTE — Inpatient Diabetes Management (Signed)
Inpatient Diabetes Program Recommendations  AACE/ADA: New Consensus Statement on Inpatient Glycemic Control (2015)  Target Ranges:  Prepandial:   less than 140 mg/dL      Peak postprandial:   less than 180 mg/dL (1-2 hours)      Critically ill patients:  140 - 180 mg/dL   Lab Results  Component Value Date   GLUCAP 263 (H) 01/22/2023   HGBA1C 8.7 (H) 01/01/2023    Review of Glycemic Control  Latest Reference Range & Units 01/22/23 09:55 01/22/23 11:21  Glucose-Capillary 70 - 99 mg/dL 161 (H) 096 (H)   Diabetes history: DM  Outpatient Diabetes medications:  Tresiba 10 units daily Novolog 5 units tid with meals Current orders for Inpatient glycemic control:  Novolog 0-6 units tid with meals and HS  Inpatient Diabetes Program Recommendations:    May consider adding Semglee 10 units daily while patient is in the hospital.   Thanks  Beryl Meager, RN, BC-ADM Inpatient Diabetes Coordinator Pager [phone]  (8a-5p)

## 2023-01-22 NOTE — Assessment & Plan Note (Addendum)
Type 2 diabetes mellitus with chronic kidney disease stage IIIb.  IV fluid hydration today.  Continue Semglee insulin and sliding scale.

## 2023-01-22 NOTE — Assessment & Plan Note (Addendum)
Stable compensated we will continue patient on aspirin hydralazine Crestor therapy. With Daily weights strict I's and O's. EKG today shows:  Most recent 2D echocardiogram in February 2024 shows: 1. Left ventricular ejection fraction, by estimation, is 60 to 65%. The  left ventricle has normal function. The left ventricle has no regional  wall motion abnormalities. Left ventricular diastolic parameters are  consistent with Grade I diastolic  dysfunction (impaired relaxation).   2. Right ventricular systolic function is normal. The right ventricular  size is not well visualized. Tricuspid regurgitation signal is inadequate  for assessing PA pressure.   3. No evidence of mitral valve regurgitation.   4. The aortic valve is tricuspid. Aortic valve regurgitation is not  visualized.   5. The inferior vena cava is normal in size with greater than 50%  respiratory variability, suggesting right atrial pressure of 3 mmHg.

## 2023-01-22 NOTE — Assessment & Plan Note (Deleted)
IV PPI therapy.  

## 2023-01-22 NOTE — Assessment & Plan Note (Addendum)
Continue Keppra.

## 2023-01-22 NOTE — ED Notes (Signed)
Checked on pt throughout shift, offered water, food, pt declines. Family at home at this time

## 2023-01-22 NOTE — Assessment & Plan Note (Deleted)
Patient is a high Fall risk due to history of stroke and deconditioning. Will place patient on fall precautions.

## 2023-01-22 NOTE — Progress Notes (Signed)
Progress Note   Patient: [name] Gilbert WNU:[id number] DOB: [birth year] DOA: 01/21/2023     0 DOS: the patient was seen and examined on 01/22/2023   Brief hospital course: 62 year old female coming in with abdominal pain.  Past medical history of type 2 diabetes mellitus, chronic kidney disease, CVA.  CT scan of the abdomen shows diffuse wall thickening of the rectum with adjacent mild fat stranding as can be seen with proctitis, lobulated contour of the liver seen with cirrhosis.  5/13.  Patient's family states that she does not eat hospital food or fluid from the rehab.  Family will bring in food.   Assessment and Plan: * Acute proctitis Patient on Rocephin and Flagyl.  Need to keep bowel movements going.  Increase MiraLAX to twice daily dosing.  Sepsis ruled out.  Stroke [facility]) Waiting to see if on aspirin and Plavix or just aspirin alone.  Continue Crestor.   Abdominal pain Lower abdominal pain secondary to proctitis.  Urinalysis negative.  Constipation. Will need to get a bowel movement going with MiraLAX twice daily and senna.  Dulcolax suppository at night.    Acute renal failure superimposed on stage 3b chronic kidney disease (HCC) Creatinine 2.09 on presentation down to 1.78.  Baseline creatinine around 1.62.  Gentle IV fluids today.  Seizure disorder (HCC) Continue Keppra  Essential hypertension Continue Norvasc and add Toprol.  Allergy listed in the computer to hydralazine.    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  IV fluid hydration today.  Continue Semglee insulin and sliding scale.            Subjective: Patient does not eat hospital food or food from rehab.  Family brings in food and I am okay with that.  I need her to eat and drink.  Started on antibiotics for thickening of the rectum.  Patient made a DO NOT RESUSCITATE.  Physical Exam: Vitals:   01/22/23 0200  01/22/23 0347 01/22/23 0817 01/22/23 1551  BP: (!) 151/103 (!) 152/71 (!) 162/70 (!) 146/72  Pulse:  81 90 82  Resp:  18 15 17   Temp:  97.9 F (36.6 C) 97.6 F (36.4 C) 98.1 F (36.7 C)  TempSrc:      SpO2:  100% 100% 100%  Weight:      Height:       Physical Exam HENT:     Head: Normocephalic.     Mouth/Throat:     Pharynx: No oropharyngeal exudate.  Eyes:     General: Lids are normal.     Conjunctiva/sclera: Conjunctivae normal.  Cardiovascular:     Rate and Rhythm: Normal rate and regular rhythm.     Heart sounds: Normal heart sounds, S1 normal and S2 normal.  Pulmonary:     Breath sounds: No decreased breath sounds, wheezing, rhonchi or rales.  Abdominal:     Palpations: Abdomen is soft.     Tenderness: There is abdominal tenderness in the suprapubic area.  Musculoskeletal:     Right lower leg: No swelling.     Left lower leg: No swelling.  Skin:    General: Skin is warm.     Findings: No rash.  Neurological:     Mental Status: She is alert.     Data Reviewed: Hemoglobin 9.9, creatinine 1.78, urine analysis negative CT scan showing diffuse wall thickening in the rectum with adjacent mild fat stranding as can be seen with proctitis, lobulated contour of  the liver suggesting cirrhosis.  Family Communication: Spoke with husband at the bedside  Disposition: Status is: Inpatient Remains inpatient appropriate because: Started on IV antibiotics  Planned Discharge Destination: Long-term care    Time spent: 28 minutes  Author: Alford Highland, MD 01/22/2023 4:13 PM  For on call review [email or web address].

## 2023-01-22 NOTE — ED Notes (Signed)
Called [name] Gilbert, got voicemail left a generic message saying that the pt was moved into a room.

## 2023-01-23 DIAGNOSIS — K6289 Other specified diseases of anus and rectum: Secondary | ICD-10-CM | POA: Diagnosis not present

## 2023-01-23 DIAGNOSIS — K5909 Other constipation: Secondary | ICD-10-CM | POA: Diagnosis not present

## 2023-01-23 DIAGNOSIS — R1030 Lower abdominal pain, unspecified: Secondary | ICD-10-CM | POA: Diagnosis not present

## 2023-01-23 DIAGNOSIS — E1121 Type 2 diabetes mellitus with diabetic nephropathy: Secondary | ICD-10-CM | POA: Diagnosis not present

## 2023-01-23 LAB — CBC
HCT: 32.7 % — ABNORMAL LOW (ref 36.0–46.0)
Hemoglobin: 10.7 g/dL — ABNORMAL LOW (ref 12.0–15.0)
MCH: 28.4 pg (ref 26.0–34.0)
MCHC: 32.7 g/dL (ref 30.0–36.0)
MCV: 86.7 fL (ref 80.0–100.0)
Platelets: 294 10*3/uL (ref 150–400)
RBC: 3.77 MIL/uL — ABNORMAL LOW (ref 3.87–5.11)
RDW: 14.6 % (ref 11.5–15.5)
WBC: 5.4 10*3/uL (ref 4.0–10.5)
nRBC: 0 % (ref 0.0–0.2)

## 2023-01-23 LAB — BASIC METABOLIC PANEL
Anion gap: 5 (ref 5–15)
BUN: 17 mg/dL (ref 8–23)
CO2: 25 mmol/L (ref 22–32)
Calcium: 8.7 mg/dL — ABNORMAL LOW (ref 8.9–10.3)
Chloride: 108 mmol/L (ref 98–111)
Creatinine, Ser: 1.72 mg/dL — ABNORMAL HIGH (ref 0.44–1.00)
GFR, Estimated: 33 mL/min — ABNORMAL LOW (ref >60)
Glucose, Bld: 175 mg/dL — ABNORMAL HIGH (ref 70–99)
Potassium: 3.7 mmol/L (ref 3.5–5.1)
Sodium: 138 mmol/L (ref 135–145)

## 2023-01-23 LAB — GLUCOSE, CAPILLARY
Glucose-Capillary: 119 mg/dL — ABNORMAL HIGH (ref 70–99)
Glucose-Capillary: 211 mg/dL — ABNORMAL HIGH (ref 70–99)
Glucose-Capillary: 124 mg/dL — ABNORMAL HIGH (ref 70–99)
Glucose-Capillary: 271 mg/dL — ABNORMAL HIGH (ref 70–99)

## 2023-01-23 LAB — CULTURE, BLOOD (ROUTINE X 2)

## 2023-01-23 MED ORDER — ADULT MULTIVITAMIN W/MINERALS CH
1.0000 | ORAL_TABLET | Freq: Every day | ORAL | Status: DC
  Administered 2023-01-23 – 2023-01-25 (×3): 1 via ORAL
  Filled 2023-01-23 (×3): qty 1

## 2023-01-23 MED ORDER — ENSURE ENLIVE PO LIQD
237.0000 mL | Freq: Three times a day (TID) | ORAL | Status: DC
  Administered 2023-01-23 (×2): 237 mL via ORAL

## 2023-01-23 NOTE — Progress Notes (Signed)
Initial Nutrition Assessment  DOCUMENTATION CODES:   Obesity unspecified  INTERVENTION:   -Ensure Enlive po BID, each supplement provides 350 kcal and 20 grams of protein -Magic cup TID with meals, each supplement provides 290 kcal and 9 grams of protein  -MVI with minerals daily -Liberalize diet to regular for widest variety of meal selections -Allow outside food to promote adequate oral intake  NUTRITION DIAGNOSIS:   Inadequate oral intake related to decreased appetite as evidenced by meal completion < 25%, per patient/family report.  GOAL:   Patient will meet greater than or equal to 90% of their needs  MONITOR:   PO intake, Supplement acceptance  REASON FOR ASSESSMENT:   Malnutrition Screening Tool    ASSESSMENT:   Pt with past medical history of diabetes mellitus with CKD stage IIIb pressure history of CVA and has visual impairment. Admitted with abdominal pain secondary to procitis and constipation.  Pt admitted with acute proctitis.   Reviewed I/O's: -957 ml x 24 hours and -207 ml since admission  UOP: 1.2 L x 24 hours   Pt sitting up in recliner chair at time of visit. Pt not very engaging with RD, mostly responding to close ended questions. Pt confirms that she was from a facility PTA, but unsure which one. Per pt, she was eating poorly PTA as she did not like the food at SNF.   Observed breakfast tray at bedside, which was unattempted. Offered to set up tray and help with feeding, however, pt politely declined. Per MD, family is bringing in outside food for pt. RD agreeable to this and encouraged pt to have family members provide preferred foods if the hospital food is still not appealing to her. RD will also liberalize diet for wider variety of meal selections for increase variety of meal selections.   Discussed importance of good meal and supplement intake to promote healing. Pt reluctantly agreed to supplements.   Reviewed wt hx; wt has been stable over the  past year.   Medications reviewed and include dulcolax, colace, keppra, miralax, and senokot.   Lab Results  Component Value Date   HGBA1C 8.7 (H) 01/01/2023   PTA DM medications are 10 units tresiba daily and 15 units insulin aspart daily.   Labs reviewed: CBGS: 119 (inpatient orders for glycemic control are 0-5 units insulin aspart daily at bedtime, 0-6 units insulin aspart TID with meals, and 10 units insulin glargine-yfgn daily).    NUTRITION - FOCUSED PHYSICAL EXAM:  Flowsheet Row Most Recent Value  Orbital Region No depletion  Upper Arm Region Mild depletion  Thoracic and Lumbar Region No depletion  Buccal Region No depletion  Temple Region No depletion  Clavicle Bone Region No depletion  Clavicle and Acromion Bone Region No depletion  Scapular Bone Region Unable to assess  Dorsal Hand Mild depletion  Patellar Region Mild depletion  Anterior Thigh Region Mild depletion  Posterior Calf Region Mild depletion  Edema (RD Assessment) None  Hair Reviewed  Eyes Reviewed  Mouth Reviewed  Skin Reviewed  Nails Reviewed       Diet Order:   Diet Order             Diet Carb Modified Fluid consistency: Thin  Diet effective now                   EDUCATION NEEDS:   Education needs have been addressed  Skin:  Skin Assessment: Reviewed RN Assessment  Last BM:  01/23/23 (type 6)  Height:  Ht Readings from Last 1 Encounters:  01/21/23 5\' 2"  (1.575 m)    Weight:   Wt Readings from Last 1 Encounters:  01/23/23 76 kg    Ideal Body Weight:  50 kg  BMI:  Body mass index is 30.65 kg/m.  Estimated Nutritional Needs:   Kcal:  1550-1750  Protein:  85-100 grams  Fluid:  > 1.5 L    Levada Schilling, RD, LDN, CDCES Registered Dietitian II Certified Diabetes Care and Education Specialist Please refer to [facility] for RD and/or RD on-call/weekend/after hours pager

## 2023-01-23 NOTE — Progress Notes (Signed)
NT and this Nurse tried assisting pt with breakfast.Pt refused cranberry juice and morning breakfast. Pt answered " cause I don't want to," when asked why she does not want to eat?

## 2023-01-23 NOTE — Assessment & Plan Note (Signed)
On aspirin and Plavix and Crestor

## 2023-01-23 NOTE — Progress Notes (Signed)
PT Cancellation Note  Patient Details Name: [name] Gilbert MRN: [id number] DOB: 23-Jan-1961   Cancelled Treatment:     Pt OOB to chair with nursing this am for ~2-3 hours. Therapist attempted, pt eating lunch, returned and pt politely declined. Will re-attempt next available date/time per POC.   Jannet Askew 01/23/2023, 4:06 PM

## 2023-01-23 NOTE — Progress Notes (Addendum)
Progress Note   Patient: [name] Gilbert ZOX:[id number] DOB: Jul 04, 1961 DOA: 01/21/2023     1 DOS: the patient was seen and examined on 01/23/2023   Brief hospital course: 62 year old female coming in with abdominal pain.  Past medical history of type 2 diabetes mellitus, chronic kidney disease, CVA.  CT scan of the abdomen shows diffuse wall thickening of the rectum with adjacent mild fat stranding as can be seen with proctitis, lobulated contour of the liver seen with cirrhosis.  5/13.  Patient's family states that she does not eat hospital food or fluid from the rehab.  Family will bring in food. 5/14.  Continue IV Rocephin   Assessment and Plan: * Acute proctitis Patient on Rocephin and Flagyl.  Need to keep bowel movements going.  Increase MiraLAX to twice daily dosing.  Sepsis ruled out.  Abdominal pain Lower abdominal pain secondary to proctitis.  Urinalysis negative.      Constipation Increase MiraLAX to twice a day dosing.  Start senna.  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   Continue Semglee insulin and sliding scale.  History of stroke On aspirin and Plavix and Crestor  Acute renal failure superimposed on stage 3b chronic kidney disease (HCC) Creatinine 2.09 on presentation down to 1.72.  Baseline creatinine around 1.62.    Seizure disorder (HCC) Continue Keppra  Primary hypertension Continue Norvasc and add Toprol.  Allergy listed in the computer to hydralazine.  Cirrhosis of the liver Seen on imaging.  Check ammonia hepatitis B and C profiles.  Check ANA.     Subjective: Patient complains of a little lower abdominal pain today.  Had bowel movements yesterday.  Admitted with lower abdominal pain found to have acute proctitis on CT scan.  Physical Exam: Vitals:   01/22/23 1551 01/23/23 0013 01/23/23 0500 01/23/23 0759  BP: (!) 146/72 (!)  151/78  (!) 151/66  Pulse: 82 78  67  Resp: 17 18  17   Temp: 98.1 F (36.7 C) 98.1 F (36.7 C)  97.8 F (36.6 C)  TempSrc:  Oral    SpO2: 100% 100%  100%  Weight:   76 kg   Height:       Physical Exam HENT:     Head: Normocephalic.     Mouth/Throat:     Pharynx: No oropharyngeal exudate.  Eyes:     General: Lids are normal.     Conjunctiva/sclera: Conjunctivae normal.  Cardiovascular:     Rate and Rhythm: Normal rate and regular rhythm.     Heart sounds: Normal heart sounds, S1 normal and S2 normal.  Pulmonary:     Breath sounds: No decreased breath sounds, wheezing, rhonchi or rales.  Abdominal:     Palpations: Abdomen is soft.     Tenderness: There is abdominal tenderness in the suprapubic area.  Musculoskeletal:     Right lower leg: No swelling.     Left lower leg: No swelling.  Skin:    General: Skin is warm.     Findings: No rash.  Neurological:     Mental Status: She is alert.     Data Reviewed: Creatinine 1.72 with a GFR of 33, hemoglobin 10.7, white blood cell count 5.4  Family Communication: Spoke with husband on the phone.  He is thinking he may want to take the patient home rather than to rehab.  Disposition: Status is: Inpatient Remains inpatient appropriate because: Continuing IV  Rocephin today and tomorrow morning.  Planned Discharge Destination: Skilled nursing facility    Time spent: 28 minutes  Author: Alford Highland, MD 01/23/2023 2:09 PM  For on call review [email or web address].

## 2023-01-24 DIAGNOSIS — K6289 Other specified diseases of anus and rectum: Secondary | ICD-10-CM | POA: Diagnosis not present

## 2023-01-24 LAB — BASIC METABOLIC PANEL
Anion gap: 9 (ref 5–15)
BUN: 19 mg/dL (ref 8–23)
CO2: 23 mmol/L (ref 22–32)
Calcium: 8.8 mg/dL — ABNORMAL LOW (ref 8.9–10.3)
Chloride: 107 mmol/L (ref 98–111)
Creatinine, Ser: 1.88 mg/dL — ABNORMAL HIGH (ref 0.44–1.00)
GFR, Estimated: 30 mL/min — ABNORMAL LOW (ref >60)
Glucose, Bld: 92 mg/dL (ref 70–99)
Potassium: 4.1 mmol/L (ref 3.5–5.1)
Sodium: 139 mmol/L (ref 135–145)

## 2023-01-24 LAB — GLUCOSE, CAPILLARY
Glucose-Capillary: 115 mg/dL — ABNORMAL HIGH (ref 70–99)
Glucose-Capillary: 118 mg/dL — ABNORMAL HIGH (ref 70–99)
Glucose-Capillary: 363 mg/dL — ABNORMAL HIGH (ref 70–99)

## 2023-01-24 LAB — AMMONIA: Ammonia: 27 umol/L (ref 9–35)

## 2023-01-24 NOTE — Progress Notes (Signed)
   01/24/23 1652  What Happened  Was fall witnessed? No  Was patient injured? No  Patient found on floor  Found by Staff-comment Josephine Cables)  Stated prior activity to/from bed, chair, or stretcher  Provider Notification  Provider Name/Title Ghimire  Date Provider Notified 01/24/23  Time Provider Notified 1830  Method of Notification Page (secure chat)  Notification Reason Fall  Provider response No new orders  Date of Provider Response 01/24/23  Time of Provider Response 1840  Follow Up  Family notified Yes - comment  Time family notified 1750  Additional tests No  Simple treatment  (none)  Progress note created (see row info) Yes  Adult Fall Risk Assessment  Risk Factor Category (scoring not indicated) High fall risk per protocol (document High fall risk)  Patient Fall Risk Level High fall risk  Adult Fall Risk Interventions  Required Bundle Interventions *See Row Information* High fall risk - low, moderate, and high requirements implemented  Additional Interventions Use of appropriate toileting equipment (bedpan, BSC, etc.)  Screening for Fall Injury Risk (To be completed on HIGH fall risk patients) - Assessing Need for Floor Mats  Risk For Fall Injury- Criteria for Floor Mats None identified - No additional interventions needed  Vitals  Temp (!) 97.5 F (36.4 C)  BP 135/79  MAP (mmHg) 95  BP Method Automatic  Pulse Rate 80  Resp 16  Oxygen Therapy  SpO2 100 %  O2 Device Room Air  Pain Assessment  Pain Scale 0-10  Pain Score 0

## 2023-01-24 NOTE — Plan of Care (Signed)
Encourage a toilet schedule or bowel training   Problem: Education: Goal: Knowledge of disease or condition will improve Outcome: Progressing Goal: Knowledge of secondary prevention will improve (MUST DOCUMENT ALL) Outcome: Progressing Goal: Knowledge of patient specific risk factors will improve [name] Gilbert N/A or DELETE if not current risk factor) Outcome: Progressing   Problem: Ischemic Stroke/TIA Tissue Perfusion: Goal: Complications of ischemic stroke/TIA will be minimized Outcome: Progressing   Problem: Coping: Goal: Will verbalize positive feelings about self Outcome: Progressing Goal: Will identify appropriate support needs Outcome: Progressing   Problem: Health Behavior/Discharge Planning: Goal: Ability to manage health-related needs will improve Outcome: Progressing Goal: Goals will be collaboratively established with patient/family Outcome: Progressing   Problem: Self-Care: Goal: Ability to participate in self-care as condition permits will improve Outcome: Progressing Goal: Verbalization of feelings and concerns over difficulty with self-care will improve Outcome: Progressing Goal: Ability to communicate needs accurately will improve Outcome: Progressing   Problem: Nutrition: Goal: Risk of aspiration will decrease Outcome: Progressing Goal: Dietary intake will improve Outcome: Progressing   Problem: Education: Goal: Knowledge of General Education information will improve Description: Including pain rating scale, medication(s)/side effects and non-pharmacologic comfort measures Outcome: Progressing   Problem: Health Behavior/Discharge Planning: Goal: Ability to manage health-related needs will improve Outcome: Progressing   Problem: Clinical Measurements: Goal: Ability to maintain clinical measurements within normal limits will improve Outcome: Progressing Goal: Will remain free from infection Outcome: Progressing Goal: Diagnostic test results will  improve Outcome: Progressing Goal: Respiratory complications will improve Outcome: Progressing Goal: Cardiovascular complication will be avoided Outcome: Progressing   Problem: Activity: Goal: Risk for activity intolerance will decrease Outcome: Progressing   Problem: Nutrition: Goal: Adequate nutrition will be maintained Outcome: Progressing   Problem: Coping: Goal: Level of anxiety will decrease Outcome: Progressing   Problem: Elimination: Goal: Will not experience complications related to bowel motility Outcome: Progressing Goal: Will not experience complications related to urinary retention Outcome: Progressing   Problem: Pain Managment: Goal: General experience of comfort will improve Outcome: Progressing   Problem: Safety: Goal: Ability to remain free from injury will improve Outcome: Progressing   Problem: Skin Integrity: Goal: Risk for impaired skin integrity will decrease Outcome: Progressing   Problem: Education: Goal: Ability to describe self-care measures that may prevent or decrease complications (Diabetes Survival Skills Education) will improve Outcome: Progressing Goal: Individualized Educational Video(s) Outcome: Progressing   Problem: Coping: Goal: Ability to adjust to condition or change in health will improve Outcome: Progressing   Problem: Fluid Volume: Goal: Ability to maintain a balanced intake and output will improve Outcome: Progressing   Problem: Health Behavior/Discharge Planning: Goal: Ability to identify and utilize available resources and services will improve Outcome: Progressing Goal: Ability to manage health-related needs will improve Outcome: Progressing   Problem: Metabolic: Goal: Ability to maintain appropriate glucose levels will improve Outcome: Progressing   Problem: Nutritional: Goal: Maintenance of adequate nutrition will improve Outcome: Progressing Goal: Progress toward achieving an optimal weight will  improve Outcome: Progressing   Problem: Skin Integrity: Goal: Risk for impaired skin integrity will decrease Outcome: Progressing   Problem: Tissue Perfusion: Goal: Adequacy of tissue perfusion will improve Outcome: Progressing

## 2023-01-24 NOTE — TOC Initial Note (Signed)
Transition of Care [facility]) - Initial/Assessment Note    Patient Details  Name: [name] Gilbert MRN: [id number] Date of Birth: Jan 10, 1961  Transition of [facility]) CM/SW Contact:    Garret Reddish, RN Phone Number: 01/24/2023, 4:03 PM  Clinical Narrative:      Chart reviewed.  I have meet with patient and her husband at bedside on yesterday.  Patient's husband [name] Gilbert reports that prior to admission patient was at [facility] for rehab.  [name] Gilbert reports that he did not like the care that patient had received at the facility.  He reports that he would like to take patient home on discharge. [name] Gilbert reported that when patient was at home she would need assistance with bathing, dressing, toileting.  [name] Gilbert reports that patient would just lay in the bed and was not able to assist him with her care at all.  [name] Gilbert reported that patient was active with [facility] prior to going to [facility].  He would like to use [facility] on discharge for [name] Gilbert.  [name] Gilbert reports that he works and that he would need some assistance during the Gilbert with Mrs. Blankenbiller.  I have given [name] Gilbert listing of Personal care agencies.  I have explained to [name] Gilbert that the [facility] services are private pay and that patient's insurance does not cover the PCS.  [name] Gilbert also reports that patient has daughter but the daughter is not able to lift [name] Gilbert.    Mr. [name] Gilbert has requested a Hospital bed and a holter lift for home use.    I have asked [name] with [employer] to order Hospital bed.    TOC will continue to follow for discharge planning.               Expected Discharge Plan:  (SNF v/s Home with Home Health and Family support) Barriers to Discharge: No Barriers Identified   Patient Goals and CMS Choice            Expected Discharge Plan and Services       Living arrangements for the past 2 months: Skilled Nursing Facility (Patient was a resident of [facility])                                       Prior Living Arrangements/Services Living arrangements for the past 2 months: Skilled Nursing Facility (Patient was a resident of [facility]) Lives with::  (Patient was at a SNF prior to admission)          Need for Family Participation in Patient Care: Yes (Comment) Care giver support system in place?: Yes (comment) (Patient has supportive Husband and daughter.)      Activities of Daily Living      Permission Sought/Granted                  Emotional Assessment Appearance:: Appears stated age   Affect (typically observed): Flat Orientation: : Oriented to Self, Oriented to Place      Admission diagnosis:  Dehydration [E86.0] Proctitis [K62.89] Abdominal pain [R10.9] Patient Active Problem List   Diagnosis Date Noted   Acute proctitis 01/22/2023   Acute renal failure superimposed on stage 3b chronic kidney disease (HCC) 01/22/2023   Constipation 01/22/2023   History of stroke 01/22/2023   TIA (transient ischemic attack) 01/01/2023   Primary  hypertension 01/01/2023   Seizure disorder (HCC) 01/01/2023   Dyslipidemia 01/01/2023   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   Advanced care planning/counseling discussion 02/02/2022   Left thalamic infarction (HCC) 02/02/2022   Hyperkalemia 02/01/2022   Closed fracture of right distal fibula    HLD (hyperlipidemia) 01/25/2022   Type II diabetes mellitus with renal manifestations (HCC) 01/25/2022   Stage 3b chronic kidney disease (CKD) (HCC) 01/25/2022   Neurologic abnormality 06/13/2021   Dehydration 06/12/2021   Headache 06/12/2021   Abdominal pain 06/12/2021   Hypoglycemia associated with diabetes (HCC) 06/12/2021   Acute respiratory failure with hypoxia (HCC)    HHNC (hyperglycemic hyperosmolar nonketotic coma) (HCC) 10/01/2019   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   PCP:  Tobey Grim, MD Pharmacy:   [facility] [phone] - [place], [place] - [address] AT [facility] OF [address] [place] [address] Phone: [phone] Fax: [phone]  [facility] #[id number] [place], [place] - [address] AT [facility] OF [address] [place] [address] Phone: [phone] Fax: [phone]     Social Determinants of Health (SDOH) Social History: SDOH Screenings   Food Insecurity: No Food Insecurity (01/24/2023)  Housing: Low Risk  (01/24/2023)  Transportation Needs: No Transportation Needs (01/24/2023)  Utilities: Not At Risk (01/24/2023)  Tobacco Use: Low Risk  (01/21/2023)   SDOH Interventions:     Readmission Risk Interventions     No data to display

## 2023-01-24 NOTE — Progress Notes (Signed)
Occupational Therapy Treatment Patient Details Name: [name] Gilbert MRN: [id number] DOB: [birth year] Today's Date: 01/24/2023   History of present illness Patient is a 62 year old female presenting with perirectal pain and acute proctitis. History of CVA. Patient coming from a facility   OT comments  Patient agreeable to OT/PT co-treatment to maximize safety and participation. Tx session targeted improving tolerance for functional mobility in the setting of ADL tasks. Pt continues to require multimodal cues for initiation and to follow single step commands. Pt able to come to EOB with Min A this date. Pt then stood from EOB with Min A +2 and completed functional mobility at room level with Mod A +1 using a RW (chair follow for safety). Once sitting in recliner, pt engaged in seated LB dressing and self-feeding. Pt left with all needs in reach. Pt is making progress toward goal completion. D/C recommendation remains appropriate. OT will continue to follow acutely.    Recommendations for follow up therapy are one component of a multi-disciplinary discharge planning process, led by the attending physician.  Recommendations may be updated based on patient status, additional functional criteria and insurance authorization.    Assistance Recommended at Discharge Frequent or constant Supervision/Assistance  Patient can return home with the following  Two people to help with walking and/or transfers; A lot of help with bathing/dressing/bathroom;Assistance with cooking/housework;Help with stairs or ramp for entrance;Assist for transportation;Direct supervision/assist for medications management   Equipment Recommendations  Other (comment) (defer to next venue of care)    Recommendations for Other Services      Precautions / Restrictions Precautions Precautions: Fall Restrictions Weight Bearing Restrictions: No       Mobility Bed Mobility Overal bed mobility: Needs Assistance Bed Mobility:  Supine to Sit     Supine to sit: Min assist, HOB elevated     General bed mobility comments: assist for trunk elevation, mulitmodal cues required for task initiation    Transfers Overall transfer level: Needs assistance Equipment used: Rolling walker (2 wheels) Transfers: Sit to/from Stand Sit to Stand: Min assist, +2 physical assistance           General transfer comment: VC's for hand placement and safe technique     Balance Overall balance assessment: Needs assistance Sitting-balance support: Feet supported Sitting balance-Leahy Scale: Good     Standing balance support: Bilateral upper extremity supported, Reliant on assistive device for balance Standing balance-Leahy Scale: Poor       ADL either performed or assessed with clinical judgement   ADL Overall ADL's : Needs assistance/impaired Eating/Feeding: Sitting;Set up Eating/Feeding Details (indicate cue type and reason): pt requesting to finish eating meal tray from lunch, increased time required to retrieve small bites of sweet potato                 Lower Body Dressing: Sitting/lateral leans;Supervision/safety Lower Body Dressing Details (indicate cue type and reason): pt able to adjust socks in sitting using figure 4 technique, Max VC required for initiation Toilet Transfer: Minimal assistance;+2 for physical assistance;Rolling walker (2 wheels) Toilet Transfer Details (indicate cue type and reason): simulated with STS from EOB         Functional mobility during ADLs: Moderate assistance;Rolling walker (2 wheels) (~1ft at room level with Mod A +1 using RW, chair follow for safety)      Extremity/Trunk Assessment Upper Extremity Assessment Upper Extremity Assessment: Generalized weakness   Lower Extremity Assessment Lower Extremity Assessment: Generalized weakness        Vision  Patient Visual Report: No change from baseline     Perception     Praxis      Cognition Arousal/Alertness:  Awake/alert Behavior During Therapy: Flat affect Overall Cognitive Status: No family/caregiver present to determine baseline cognitive functioning           General Comments: delayed processing, requires multimodal cues to follow simple commands        Exercises      Shoulder Instructions       General Comments      Pertinent Vitals/ Pain       Pain Assessment Pain Assessment: Faces Faces Pain Scale: No hurt  Home Living       Prior Functioning/Environment              Frequency  Min 1X/week        Progress Toward Goals  OT Goals(current goals can now be found in the care plan section)  Progress towards OT goals: Progressing toward goals  Acute Rehab OT Goals Patient Stated Goal: to feel better OT Goal Formulation: With patient Time For Goal Achievement: 02/05/23 Potential to Achieve Goals: Fair  Plan Discharge plan remains appropriate;Frequency remains appropriate    Co-evaluation    PT/OT/SLP Co-Evaluation/Treatment: Yes Reason for Co-Treatment: To address functional/ADL transfers;For patient/therapist safety PT goals addressed during session: Mobility/safety with mobility;Balance;Proper use of DME OT goals addressed during session: ADL's and self-care;Proper use of Adaptive equipment and DME      AM-PAC OT "6 Clicks" Daily Activity     Outcome Measure   Help from another person eating meals?: A Little Help from another person taking care of personal grooming?: A Little Help from another person toileting, which includes using toliet, bedpan, or urinal?: A Lot Help from another person bathing (including washing, rinsing, drying)?: A Lot Help from another person to put on and taking off regular upper body clothing?: A Lot Help from another person to put on and taking off regular lower body clothing?: A Little 6 Click Score: 15    End of Session Equipment Utilized During Treatment: Gait belt;Rolling walker (2 wheels)  OT Visit Diagnosis:  Unsteadiness on feet (R26.81);Muscle weakness (generalized) (M62.81);Pain   Activity Tolerance Patient tolerated treatment well   Patient Left in chair;with call bell/phone within reach;with chair alarm set   Nurse Communication Mobility status        Time: 1610-9604 OT Time Calculation (min): 21 min  Charges: OT General Charges $OT Visit: 1 Visit OT Treatments $Self Care/Home Management : 8-22 mins  [facility] MS, OTR/L ascom [phone]  01/24/23, 5:11 PM

## 2023-01-24 NOTE — Progress Notes (Signed)
Received order to discontinue cardiac monitoring .  See new order

## 2023-01-24 NOTE — Progress Notes (Signed)
Patient has (history of a stroke with right hemiparesis) which requires (upper and lower extrem to the right side of patient's body, as patient has right hemiparesis ) to be positioned in ways not feasible with a normal bed. Head  of bed must be elevated at least 90 degrees due to risk of aspiration.( Due to patient having right hemiparesis requires (frequent/immediate) changes in body position which cannot be achieved with a normal bed.

## 2023-01-24 NOTE — Progress Notes (Signed)
PROGRESS NOTE    [name] Gilbert  XBM:[id number] DOB: 1961/06/05 DOA: 01/21/2023 PCP: Tobey Grim, MD    Brief Narrative:  63 year old with history of type 2 diabetes on insulin, CKD stage IIIb, hypertension and previous history of stroke who was recently admitted to the hospital with transient altered mental status with confusion and expressive aphasia was sent to skilled nursing facility on 4/25, came back to the hospital on 5/12 with abdominal pain and constipated.  Patient was also not tolerating oral intake.  Disimpacted in the emergency room, continue to have perirectal pain so admitted to the hospital.  CT scan showed proctocolitis.   Assessment & Plan:   Constipation, stool impaction and acute proctitis, stercoral ulcer. Clinically improved after disimpaction and now on aggressive bowel regimen. CT scan with diffuse wall thickening in the rectum and mild fatty stranding. Clinically improving.  Due to her ambulatory dysfunction and a stroke, she is very high risk of constipation.  Continue bowel regimen. On Rocephin, day 3 today.  No evidence of infection.  Discontinue.  History of stroke with right hemiparesis No evidence of new neurological deficit.  She is on aspirin, Plavix and Crestor. Acute renal failure superimposed on CKD stage IIIb: Stabilized and renal functions at her usual self. Seizure disorder: On Keppra.  No evidence of new seizure. Essential hypertension: Blood pressure stable on Norvasc and Toprol.    DVT prophylaxis: heparin injection 5,000 Units Start: 01/22/23 0600   Code Status: DNR Family Communication: No family at the bedside.  Husband unable to pick up the phone. Disposition Plan: Status is: Inpatient Remains inpatient appropriate because: Significant weakness, disposition pending.  Will need a SNF.     Consultants:  None  Procedures:  None  Antimicrobials:  Rocephin and Flagyl.  Discontinued.   Subjective: Patient seen and  examined.  Flat affect.  Poor historian.  Does not interact much.  Objective: Vitals:   01/23/23 1554 01/23/23 2348 01/24/23 0500 01/24/23 0814  BP: (!) 148/70 124/70  120/60  Pulse: 71 72  62  Resp: 17 18  14   Temp: 97.9 F (36.6 C) (!) 97.5 F (36.4 C)  (!) 97.4 F (36.3 C)  TempSrc:      SpO2: 100% 100%  100%  Weight:   74 kg   Height:        Intake/Output Summary (Last 24 hours) at 01/24/2023 1156 Last data filed at 01/24/2023 0915 Gross per 24 hour  Intake 0 ml  Output 300 ml  Net -300 ml   Filed Weights   01/21/23 1457 01/23/23 0500 01/24/23 0500  Weight: 75 kg 76 kg 74 kg    Examination:  General: Chronically sick looking.  Frail and debilitated.  On room air. Patient is alert, does not talk much.  Difficult to ask orientation questions. She has generalized weakness.  She is keeping her right arm in flexion position. Cardiovascular: S1-S2 normal.  Regular rate rhythm. Respiratory: Bilateral clear.  No added sounds. Gastrointestinal: Soft and nontender.  Bowel sound present. Ext: No edema.     Data Reviewed: I have personally reviewed following labs and imaging studies  CBC: Recent Labs  Lab 01/21/23 1518 01/22/23 0333 01/23/23 0446  WBC 6.7 5.2 5.4  HGB 12.7 9.9* 10.7*  HCT 38.7 30.0* 32.7*  MCV 87.0 86.7 86.7  PLT 311 259 294   Basic Metabolic Panel: Recent Labs  Lab 01/21/23 1518 01/22/23 0333 01/23/23 0446 01/24/23 0633  NA 138 138 138 139  K 3.6 3.7 3.7  4.1  CL 102 107 108 107  CO2 22 24 25 23   GLUCOSE 193* 267* 175* 92  BUN 22 18 17 19   CREATININE 2.09* 1.78* 1.72* 1.88*  CALCIUM 9.0 8.0* 8.7* 8.8*   GFR: Estimated Creatinine Clearance: 28.9 mL/min (A) (by C-G formula based on SCr of 1.88 mg/dL (H)). Liver Function Tests: Recent Labs  Lab 01/21/23 1518 01/22/23 0333  AST 26 19  ALT 15 12  ALKPHOS 107 80  BILITOT 0.8 0.6  PROT 9.0* 6.8  ALBUMIN 3.8 2.8*   No results for input(s): "LIPASE", "AMYLASE" in the last 168  hours. Recent Labs  Lab 01/24/23 0633  AMMONIA 27   Coagulation Profile: Recent Labs  Lab 01/21/23 1520  INR 1.1   Cardiac Enzymes: No results for input(s): "CKTOTAL", "CKMB", "CKMBINDEX", "TROPONINI" in the last 168 hours. BNP (last 3 results) No results for input(s): "PROBNP" in the last 8760 hours. HbA1C: No results for input(s): "HGBA1C" in the last 72 hours. CBG: Recent Labs  Lab 01/23/23 0811 01/23/23 1133 01/23/23 1705 01/23/23 2136 01/24/23 0808  GLUCAP 119* 211* 124* 271* 115*   Lipid Profile: No results for input(s): "CHOL", "HDL", "LDLCALC", "TRIG", "CHOLHDL", "LDLDIRECT" in the last 72 hours. Thyroid Function Tests: No results for input(s): "TSH", "T4TOTAL", "FREET4", "T3FREE", "THYROIDAB" in the last 72 hours. Anemia Panel: No results for input(s): "VITAMINB12", "FOLATE", "FERRITIN", "TIBC", "IRON", "RETICCTPCT" in the last 72 hours. Sepsis Labs: Recent Labs  Lab 01/21/23 1533 01/21/23 1808  LATICACIDVEN 2.6* 0.8    Recent Results (from the past 240 hour(s))  Blood Culture (routine x 2)     Status: None (Preliminary result)   Collection Time: 01/21/23  3:33 PM   Specimen: BLOOD  Result Value Ref Range Status   Specimen Description BLOOD LEFT ANTECUBITAL  Final   Special Requests   Final    BOTTLES DRAWN AEROBIC AND ANAEROBIC Blood Culture adequate volume   Culture   Final    NO GROWTH 3 DAYS Performed at [facility], [address], [place], [place] [address]    Report Status PENDING  Incomplete  Blood Culture (routine x 2)     Status: None (Preliminary result)   Collection Time: 01/21/23  3:33 PM   Specimen: BLOOD  Result Value Ref Range Status   Specimen Description BLOOD BLOOD RIGHT FOREARM  Final   Special Requests   Final    BOTTLES DRAWN AEROBIC AND ANAEROBIC Blood Culture adequate volume   Culture   Final    NO GROWTH 3 DAYS Performed at [facility], [address], [place], [place] [address]    Report Status  PENDING  Incomplete         Radiology Studies: No results found.      Scheduled Meds:  amLODipine  10 mg Oral Daily   aspirin EC  81 mg Oral Daily   bisacodyl  10 mg Rectal QPM   clopidogrel  75 mg Oral Daily   docusate sodium  200 mg Oral Daily   feeding supplement  237 mL Oral TID BM   heparin  5,000 Units Subcutaneous Q8H   insulin aspart  0-5 Units Subcutaneous QHS   insulin aspart  0-6 Units Subcutaneous TID WC   insulin glargine-yfgn  10 Units Subcutaneous QHS   levETIRAcetam  250 mg Oral BID   metoprolol succinate  25 mg Oral Daily   multivitamin with minerals  1 tablet Oral Daily   polyethylene glycol  17 g Oral BID  rosuvastatin  20 mg Oral QHS   senna-docusate  2 tablet Oral QHS   sodium chloride flush  3 mL Intravenous Q12H   Continuous Infusions:   LOS: 2 days    Time spent: 35 minutes    Dorcas Carrow, MD [facility] Pager [phone]

## 2023-01-24 NOTE — Progress Notes (Signed)
Palliative:   Chart reviewed. Visited patient. Unable to discuss GOC independently. No one at bedside. Unable to get in touch with spouse/decision maker. Will reattempt 5/16.   Tomia Enlow Lee Shaunita Seney, DNP, AGNP-C Palliative Medicine Team Team Phone # [phone]  Pager # [phone]   NO CHARGE 

## 2023-01-24 NOTE — Progress Notes (Signed)
Physical Therapy Treatment Patient Details Name: [name] Gilbert MRN: [id number] DOB: 1961/05/21 Today's Date: 01/24/2023   History of Present Illness Patient is a 62 year old female presenting with perirectal pain and acute proctitis. History of CVA. Patient coming from a facility    PT Comments    Pt received supine in bed agreeable to PT/OT co-treatment. Pt progressing in mobility only relying on minA+1 for bed mobility, minA+2 for STS to RW and modA+1 and chair follow for gait. Pt with scissoring gait with poor motor pattern sequencing forward steps and propulsion on RW relying on max hand over hand and multimodal cues to improve safe gait mechanics with limited to fair carryover. Pt becomes fatigued after ~12' with inability to progress steps and increased WOB thus pt placed in recliner.  Pt left in care of OT. Pt making good progress towards goals but remains a very high falls risk thus PT d/c recs remain appropriate at this time.    Recommendations for follow up therapy are one component of a multi-disciplinary discharge planning process, led by the attending physician.  Recommendations may be updated based on patient status, additional functional criteria and insurance authorization.  Follow Up Recommendations  Can patient physically be transported by private vehicle: No    Assistance Recommended at Discharge Frequent or constant Supervision/Assistance  Patient can return home with the following Two people to help with walking and/or transfers;A lot of help with bathing/dressing/bathroom;Assist for transportation;Help with stairs or ramp for entrance;Direct supervision/assist for medications management;Direct supervision/assist for financial management;Assistance with cooking/housework   Equipment Recommendations  None recommended by PT    Recommendations for Other Services       Precautions / Restrictions Precautions Precautions: Fall Restrictions Weight Bearing Restrictions:  No     Mobility  Bed Mobility Overal bed mobility: Needs Assistance Bed Mobility: Supine to Sit     Supine to sit: Min assist, HOB elevated       Patient Response: Cooperative, Flat affect  Transfers Overall transfer level: Needs assistance Equipment used: Rolling walker (2 wheels) Transfers: Sit to/from Stand Sit to Stand: Min assist, +2 physical assistance           General transfer comment: VC's for hand placement    Ambulation/Gait Ambulation/Gait assistance: Mod assist Gait Distance (Feet): 12 Feet Assistive device: Rolling walker (2 wheels) Gait Pattern/deviations: Trunk flexed, Scissoring       General Gait Details: Chair follow for safety. Requiring hand over hand and max multimodal cues for sequencing feet and forward progression of RW.   Stairs             Wheelchair Mobility    Modified Rankin (Stroke Patients Only)       Balance Overall balance assessment: Needs assistance Sitting-balance support: Feet supported Sitting balance-Leahy Scale: Good     Standing balance support: Bilateral upper extremity supported, Reliant on assistive device for balance Standing balance-Leahy Scale: Poor                              Cognition Arousal/Alertness: Awake/alert Behavior During Therapy: Flat affect Overall Cognitive Status: No family/caregiver present to determine baseline cognitive functioning                                 General Comments: delayed processing, requires multimodal cues to follow simple commands        Exercises  General Comments        Pertinent Vitals/Pain Pain Assessment Pain Assessment: Faces Faces Pain Scale: No hurt    Home Living                          Prior Function            PT Goals (current goals can now be found in the care plan section) Acute Rehab PT Goals Patient Stated Goal: to feel better PT Goal Formulation: With patient Time For Goal  Achievement: 02/05/23 Potential to Achieve Goals: Fair Progress towards PT goals: Progressing toward goals    Frequency    Min 2X/week      PT Plan Current plan remains appropriate    Co-evaluation PT/OT/SLP Co-Evaluation/Treatment: Yes Reason for Co-Treatment: To address functional/ADL transfers;For patient/therapist safety PT goals addressed during session: Mobility/safety with mobility;Balance;Proper use of DME OT goals addressed during session: ADL's and self-care;Proper use of Adaptive equipment and DME      AM-PAC PT "6 Clicks" Mobility   Outcome Measure  Help needed turning from your back to your side while in a flat bed without using bedrails?: A Lot Help needed moving from lying on your back to sitting on the side of a flat bed without using bedrails?: A Lot Help needed moving to and from a bed to a chair (including a wheelchair)?: A Lot Help needed standing up from a chair using your arms (e.g., wheelchair or bedside chair)?: A Little Help needed to walk in hospital room?: A Lot Help needed climbing 3-5 steps with a railing? : Total 6 Click Score: 12    End of Session Equipment Utilized During Treatment: Gait belt Activity Tolerance: Patient tolerated treatment well Patient left: in chair;with call bell/phone within reach;with chair alarm set Nurse Communication: Mobility status PT Visit Diagnosis: Unsteadiness on feet (R26.81);Muscle weakness (generalized) (M62.81)     Time: 9604-5409 PT Time Calculation (min) (ACUTE ONLY): 13 min  Charges:  $Gait Training: 8-22 mins                     Delphia Grates. Fairly IV, PT, DPT Physical Therapist- [place]  [facility]  01/24/2023, 4:01 PM

## 2023-01-24 NOTE — Plan of Care (Signed)
  Problem: Education: Goal: Knowledge of disease or condition will improve Outcome: Progressing Goal: Knowledge of secondary prevention will improve (MUST DOCUMENT ALL) Outcome: Progressing Goal: Knowledge of patient specific risk factors will improve (Mark N/A or DELETE if not current risk factor) Outcome: Progressing   Problem: Ischemic Stroke/TIA Tissue Perfusion: Goal: Complications of ischemic stroke/TIA will be minimized Outcome: Progressing   Problem: Coping: Goal: Will verbalize positive feelings about self Outcome: Progressing Goal: Will identify appropriate support needs Outcome: Progressing   Problem: Health Behavior/Discharge Planning: Goal: Ability to manage health-related needs will improve Outcome: Progressing Goal: Goals will be collaboratively established with patient/family Outcome: Progressing   Problem: Self-Care: Goal: Ability to participate in self-care as condition permits will improve Outcome: Progressing Goal: Verbalization of feelings and concerns over difficulty with self-care will improve Outcome: Progressing Goal: Ability to communicate needs accurately will improve Outcome: Progressing   Problem: Nutrition: Goal: Risk of aspiration will decrease Outcome: Progressing Goal: Dietary intake will improve Outcome: Progressing   Problem: Education: Goal: Knowledge of General Education information will improve Description: Including pain rating scale, medication(s)/side effects and non-pharmacologic comfort measures Outcome: Progressing   Problem: Health Behavior/Discharge Planning: Goal: Ability to manage health-related needs will improve Outcome: Progressing   Problem: Clinical Measurements: Goal: Ability to maintain clinical measurements within normal limits will improve Outcome: Progressing Goal: Will remain free from infection Outcome: Progressing Goal: Diagnostic test results will improve Outcome: Progressing Goal: Respiratory  complications will improve Outcome: Progressing Goal: Cardiovascular complication will be avoided Outcome: Progressing   Problem: Activity: Goal: Risk for activity intolerance will decrease Outcome: Progressing   Problem: Nutrition: Goal: Adequate nutrition will be maintained Outcome: Progressing   Problem: Coping: Goal: Level of anxiety will decrease Outcome: Progressing   Problem: Elimination: Goal: Will not experience complications related to bowel motility Outcome: Progressing Goal: Will not experience complications related to urinary retention Outcome: Progressing   Problem: Pain Managment: Goal: General experience of comfort will improve Outcome: Progressing   Problem: Safety: Goal: Ability to remain free from injury will improve Outcome: Progressing   Problem: Skin Integrity: Goal: Risk for impaired skin integrity will decrease Outcome: Progressing   Problem: Education: Goal: Ability to describe self-care measures that may prevent or decrease complications (Diabetes Survival Skills Education) will improve Outcome: Progressing Goal: Individualized Educational Video(s) Outcome: Progressing   Problem: Coping: Goal: Ability to adjust to condition or change in health will improve Outcome: Progressing   Problem: Fluid Volume: Goal: Ability to maintain a balanced intake and output will improve Outcome: Progressing   Problem: Health Behavior/Discharge Planning: Goal: Ability to identify and utilize available resources and services will improve Outcome: Progressing Goal: Ability to manage health-related needs will improve Outcome: Progressing   Problem: Metabolic: Goal: Ability to maintain appropriate glucose levels will improve Outcome: Progressing   Problem: Nutritional: Goal: Maintenance of adequate nutrition will improve Outcome: Progressing Goal: Progress toward achieving an optimal weight will improve Outcome: Progressing   Problem: Skin  Integrity: Goal: Risk for impaired skin integrity will decrease Outcome: Progressing   Problem: Tissue Perfusion: Goal: Adequacy of tissue perfusion will improve Outcome: Progressing   

## 2023-01-25 DIAGNOSIS — Z66 Do not resuscitate: Secondary | ICD-10-CM

## 2023-01-25 DIAGNOSIS — Z7189 Other specified counseling: Secondary | ICD-10-CM | POA: Diagnosis not present

## 2023-01-25 DIAGNOSIS — K6289 Other specified diseases of anus and rectum: Secondary | ICD-10-CM | POA: Diagnosis not present

## 2023-01-25 DIAGNOSIS — Z515 Encounter for palliative care: Secondary | ICD-10-CM

## 2023-01-25 DIAGNOSIS — Z8673 Personal history of transient ischemic attack (TIA), and cerebral infarction without residual deficits: Secondary | ICD-10-CM | POA: Diagnosis not present

## 2023-01-25 LAB — ENA+DNA/DS+ANTICH+CENTRO+JO...
Anti JO-1: <0.2 AI (ref 0.0–0.9)
Centromere Ab Screen: <0.2 AI (ref 0.0–0.9)
Chromatin Ab SerPl-aCnc: 0.5 AI (ref 0.0–0.9)
ENA SM Ab Ser-aCnc: <0.2 AI (ref 0.0–0.9)
Ribonucleic Protein: 0.3 AI (ref 0.0–0.9)
SSA (Ro) (ENA) Antibody, IgG: 7.5 AI — ABNORMAL HIGH (ref 0.0–0.9)
SSB (La) (ENA) Antibody, IgG: 0.2 AI (ref 0.0–0.9)
Scleroderma (Scl-70) (ENA) Antibody, IgG: <0.2 AI (ref 0.0–0.9)
ds DNA Ab: 1 IU/mL (ref 0–9)

## 2023-01-25 LAB — ANA W/REFLEX IF POSITIVE: Anti Nuclear Antibody (ANA): POSITIVE — AB

## 2023-01-25 LAB — GLUCOSE, CAPILLARY
Glucose-Capillary: 115 mg/dL — ABNORMAL HIGH (ref 70–99)
Glucose-Capillary: 191 mg/dL — ABNORMAL HIGH (ref 70–99)

## 2023-01-25 LAB — HEPATITIS C ANTIBODY: HCV Ab: NONREACTIVE — AB

## 2023-01-25 MED ORDER — METOPROLOL SUCCINATE ER 25 MG PO TB24: 25.0000 mg | ORAL_TABLET | Freq: Every day | ORAL | 0 refills | Status: DC

## 2023-01-25 MED ORDER — INSULIN ASPART FLEXPEN 100 UNIT/ML ~~LOC~~ SOPN: 5.0000 [IU] | PEN_INJECTOR | Freq: Three times a day (TID) | SUBCUTANEOUS | 0 refills | Status: DC

## 2023-01-25 MED ORDER — ROSUVASTATIN CALCIUM 40 MG PO TABS: 20.0000 mg | ORAL_TABLET | Freq: Every day | ORAL | 0 refills | Status: DC

## 2023-01-25 MED ORDER — CLOPIDOGREL BISULFATE 75 MG PO TABS: 75.0000 mg | ORAL_TABLET | Freq: Every day | ORAL | 11 refills | Status: DC

## 2023-01-25 MED ORDER — TRESIBA FLEXTOUCH 100 UNIT/ML ~~LOC~~ SOPN: 10.0000 [IU] | PEN_INJECTOR | Freq: Every day | SUBCUTANEOUS | 0 refills | Status: DC

## 2023-01-25 NOTE — TOC Progression Note (Signed)
Transition of Care [facility]) - Progression Note    Patient Details  Name: [name] Gilbert MRN: [id number] Date of Birth: 01/14/1961  Transition of Care [facility]) CM/SW Contact  Garret Reddish, RN Phone Number: 01/25/2023, 10:37 AM  Clinical Narrative:   Noted orders for patient to have Palliative Care follow her on discharge.    I have spoken with Mr. [name].  He is agreeable for Palliative to follow on discharge.  [name] did not have preference of Palliative care agency.  I have asked Annice Pih with [facility] to accept Palliative Care consult.        Expected Discharge Plan:  (SNF v/s Home with Home Health and Family support) Barriers to Discharge: No Barriers Identified  Expected Discharge Plan and Services       Living arrangements for the past 2 months: Skilled Nursing Facility (Patient was a resident of [facility]) Expected Discharge Date: 01/25/23               DME Arranged: Hospital bed DME Agency: [employer] Date DME Agency Contacted: 01/23/23   Representative spoke with at DME Agency: [name] HH Arranged: PT, OT, Nurse's Aide, Social Work [employer] Agency: [facility] Date HH Agency Contacted: 01/25/23 Time HH Agency Contacted: 1000 Representative spoke with at [facility] Agency: [facility]   Social Determinants of Health (SDOH) Interventions SDOH Screenings   Food Insecurity: No Food Insecurity (01/24/2023)  Housing: Low Risk  (01/24/2023)  Transportation Needs: No Transportation Needs (01/24/2023)  Utilities: Not At Risk (01/24/2023)  Tobacco Use: Low Risk  (01/21/2023)    Readmission Risk Interventions     No data to display

## 2023-01-25 NOTE — Plan of Care (Signed)
Continue bowel regimen, suppositories, stool softeners  Problem: Education: Goal: Knowledge of disease or condition will improve Outcome: Progressing Goal: Knowledge of secondary prevention will improve (MUST DOCUMENT ALL) Outcome: Progressing Goal: Knowledge of patient specific risk factors will improve Loraine Leriche N/A or DELETE if not current risk factor) Outcome: Progressing   Problem: Ischemic Stroke/TIA Tissue Perfusion: Goal: Complications of ischemic stroke/TIA will be minimized Outcome: Progressing   Problem: Coping: Goal: Will verbalize positive feelings about self Outcome: Progressing Goal: Will identify appropriate support needs Outcome: Progressing   Problem: Health Behavior/Discharge Planning: Goal: Ability to manage health-related needs will improve Outcome: Progressing Goal: Goals will be collaboratively established with patient/family Outcome: Progressing   Problem: Self-Care: Goal: Ability to participate in self-care as condition permits will improve Outcome: Progressing Goal: Verbalization of feelings and concerns over difficulty with self-care will improve Outcome: Progressing Goal: Ability to communicate needs accurately will improve Outcome: Progressing   Problem: Nutrition: Goal: Risk of aspiration will decrease Outcome: Progressing Goal: Dietary intake will improve Outcome: Progressing   Problem: Education: Goal: Knowledge of General Education information will improve Description: Including pain rating scale, medication(s)/side effects and non-pharmacologic comfort measures Outcome: Progressing   Problem: Health Behavior/Discharge Planning: Goal: Ability to manage health-related needs will improve Outcome: Progressing   Problem: Clinical Measurements: Goal: Ability to maintain clinical measurements within normal limits will improve Outcome: Progressing Goal: Will remain free from infection Outcome: Progressing Goal: Diagnostic test results will  improve Outcome: Progressing Goal: Respiratory complications will improve Outcome: Progressing Goal: Cardiovascular complication will be avoided Outcome: Progressing   Problem: Activity: Goal: Risk for activity intolerance will decrease Outcome: Progressing   Problem: Nutrition: Goal: Adequate nutrition will be maintained Outcome: Progressing   Problem: Coping: Goal: Level of anxiety will decrease Outcome: Progressing   Problem: Elimination: Goal: Will not experience complications related to bowel motility Outcome: Progressing Goal: Will not experience complications related to urinary retention Outcome: Progressing   Problem: Pain Managment: Goal: General experience of comfort will improve Outcome: Progressing   Problem: Safety: Goal: Ability to remain free from injury will improve Outcome: Progressing   Problem: Skin Integrity: Goal: Risk for impaired skin integrity will decrease Outcome: Progressing   Problem: Education: Goal: Ability to describe self-care measures that may prevent or decrease complications (Diabetes Survival Skills Education) will improve Outcome: Progressing Goal: Individualized Educational Video(s) Outcome: Progressing   Problem: Coping: Goal: Ability to adjust to condition or change in health will improve Outcome: Progressing   Problem: Fluid Volume: Goal: Ability to maintain a balanced intake and output will improve Outcome: Progressing   Problem: Health Behavior/Discharge Planning: Goal: Ability to identify and utilize available resources and services will improve Outcome: Progressing Goal: Ability to manage health-related needs will improve Outcome: Progressing   Problem: Metabolic: Goal: Ability to maintain appropriate glucose levels will improve Outcome: Progressing   Problem: Nutritional: Goal: Maintenance of adequate nutrition will improve Outcome: Progressing Goal: Progress toward achieving an optimal weight will  improve Outcome: Progressing   Problem: Skin Integrity: Goal: Risk for impaired skin integrity will decrease Outcome: Progressing   Problem: Tissue Perfusion: Goal: Adequacy of tissue perfusion will improve Outcome: Progressing

## 2023-01-25 NOTE — Discharge Summary (Signed)
Physician Discharge Summary  [name] Gilbert DGU:[id number] DOB: 1961/04/30 DOA: 01/21/2023  PCP: Tobey Grim, MD  Admit date: 01/21/2023 Discharge date: 01/25/2023  Admitted From: Skilled nursing facility Disposition: Home with home health  Recommendations for Outpatient Follow-up:  Follow up with PCP in 1-2 weeks Please obtain BMP/CBC/magnesium in 1 week.   Home Health: PT/OT/RN/home health aide/social worker Equipment/Devices: Hospital bed  Discharge Condition: Fair CODE STATUS: DNR Diet recommendation: Low-salt diet  Discharge summary: 62 year old with history of type 2 diabetes on insulin, CKD stage IIIb, hypertension and previous history of stroke who was recently admitted to the hospital with transient altered mental status with confusion and expressive aphasia was sent to skilled nursing facility on 4/25, came back to the hospital on 5/12 with abdominal pain and constipated.  Patient was also not tolerating oral intake.  Stool disimpacted in the emergency room, continue to have perirectal pain so admitted to the hospital.  CT scan showed proctocolitis. Treated with laxatives, fecal disimpaction with improvement of symptoms.  Family decided to take her home with support system at home.     Assessment & plan of care:   Constipation, stool impaction and acute proctitis, stercoral ulcer.  Clinically improved after disimpaction and now on aggressive bowel regimen. CT scan with diffuse wall thickening in the rectum and mild fatty stranding. Clinically improving.  Due to her ambulatory dysfunction and stroke, she is very high risk of constipation.  Continue bowel regimen.  Treated with antibiotics.  No evidence of infection.  Discontinued. Patient will take stool softener and MiraLAX every day to avoid constipation.  History of stroke with spastic right hemiparesis: No evidence of new neurological deficit.  She is on aspirin, Plavix and Crestor. Patient completed 3 weeks of  dual antiplatelet therapy since initial prescription, she will only continue Plavix.  Continue Crestor.  Acute renal failure superimposed on CKD stage IIIb: Stabilized and renal functions at her usual self.  Recheck in 1 week.  Seizure disorder: On Keppra.  No evidence of new seizure.  Essential hypertension: Blood pressure stable on Norvasc and Toprol.  Type 2 diabetes: Well-controlled.  On insulin.  Prescriptions provided for [place] and NovoLog that she takes at home.  Patient is medically stable to discharge home.   Discharge Diagnoses:  Principal Problem:   Acute proctitis Active Problems:   Abdominal pain   Constipation   Type II diabetes mellitus with renal manifestations (HCC)   Primary hypertension   Seizure disorder (HCC)   Acute renal failure superimposed on stage 3b chronic kidney disease (HCC)   History of stroke    Discharge Instructions  Discharge Instructions     Diet - low sodium heart healthy   Complete by: As directed    Diet Carb Modified   Complete by: As directed    Increase activity slowly   Complete by: As directed       Allergies as of 01/25/2023       Reactions   Metformin Other (See Comments)   Reaction: unknown   Penicillins Hives, Nausea And Vomiting, Swelling   Has patient had a PCN reaction causing immediate rash, facial/tongue/throat swelling, SOB or lightheadedness with hypotension: Yes Has patient had a PCN reaction causing severe rash involving mucus membranes or skin necrosis: No Has patient had a PCN reaction that required hospitalization Yes Has patient had a PCN reaction occurring within the last 10 years: No If all of the above answers are "NO", then may proceed with Cephalosporin use.   Codeine  Other (See Comments)   Reaction: HIVES   Hydralazine    Stomach pain   Statins Other (See Comments)   Myopathy with atorva 80        Medication List     STOP taking these medications    aspirin EC 81 MG tablet    hydrALAZINE 25 MG tablet Commonly known as: APRESOLINE   Vitamin D (Ergocalciferol) 1.25 MG (50000 UNIT) Caps capsule Commonly known as: DRISDOL       TAKE these medications    acetaminophen 325 MG tablet Commonly known as: TYLENOL Take 2 tablets (650 mg total) by mouth every 4 (four) hours as needed for mild pain (or temp > 37.5 C (99.5 F)).   amLODipine 10 MG tablet Commonly known as: NORVASC Take 10 mg by mouth daily.   B-complex with vitamin C tablet Take 1 tablet by mouth daily.   clopidogrel 75 MG tablet Commonly known as: PLAVIX Take 1 tablet (75 mg total) by mouth daily.   Insulin Aspart FlexPen 100 UNIT/ML Commonly known as: NOVOLOG Inject 5 Units into the skin with breakfast, with lunch, and with evening meal. What changed: See the new instructions.   levETIRAcetam 250 MG tablet Commonly known as: KEPPRA Take 250 mg by mouth 2 (two) times daily.   metoprolol succinate 25 MG 24 hr tablet Commonly known as: TOPROL-XL Take 1 tablet (25 mg total) by mouth daily.   polyethylene glycol 17 g packet Commonly known as: MIRALAX / GLYCOLAX Take 17 g by mouth daily.   rosuvastatin 40 MG tablet Commonly known as: CRESTOR Take 0.5 tablets (20 mg total) by mouth at bedtime.   senna-docusate 8.6-50 MG tablet Commonly known as: Senokot-S Take 2 tablets by mouth at bedtime.   Evaristo Bury FlexTouch 100 UNIT/ML FlexTouch Pen Generic drug: insulin degludec Inject 10 Units into the skin at bedtime.               Durable Medical Equipment  (From admission, onward)           Start     Ordered   01/23/23 1438  For home use only DME Hospital bed  Once       Question Answer Comment  Length of Need Lifetime   The above medical condition requires: Patient requires the ability to reposition immediately   Head must be elevated greater than: 30 degrees   Bed type Semi-electric   Hoyer Lift Yes   Support Surface: Gel Overlay      01/23/23 1437             Allergies  Allergen Reactions   Metformin Other (See Comments)    Reaction: unknown   Penicillins Hives, Nausea And Vomiting and Swelling    Has patient had a PCN reaction causing immediate rash, facial/tongue/throat swelling, SOB or lightheadedness with hypotension: Yes Has patient had a PCN reaction causing severe rash involving mucus membranes or skin necrosis: No Has patient had a PCN reaction that required hospitalization Yes Has patient had a PCN reaction occurring within the last 10 years: No If all of the above answers are "NO", then may proceed with Cephalosporin use.    Codeine Other (See Comments)    Reaction: HIVES   Hydralazine     Stomach pain   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    Consultations: None   Procedures/Studies: CT ABDOMEN PELVIS WO CONTRAST  Result Date: 01/21/2023 CLINICAL DATA:  Left lower quadrant pain. EXAM: CT ABDOMEN  AND PELVIS WITHOUT CONTRAST TECHNIQUE: Multidetector CT imaging of the abdomen and pelvis was performed following the standard protocol without IV contrast. RADIATION DOSE REDUCTION: This exam was performed according to the departmental dose-optimization program which includes automated exposure control, adjustment of the mA and/or kV according to patient size and/or use of iterative reconstruction technique. COMPARISON:  CT abdomen pelvis 11/21/2022 FINDINGS: Lower chest: No acute abnormality. Evaluation of the abdominal viscera limited by the lack of IV contrast. Hepatobiliary: Lobulated contour suggesting cirrhosis. No new focal liver lesion. Normal gallbladder. Pancreas: Generalized atrophy. Spleen: Normal in size without focal abnormality. Adrenals/Urinary Tract: Adrenal glands are unremarkable. No renal calculi or hydronephrosis. Unremarkable urinary bladder. Stomach/Bowel: Stomach is within normal limits. Appendix appears normal. There is diffuse wall thickening in the rectum with adjacent mild fat stranding. No  evidence of obstruction. Scattered colonic diverticula. Vascular/Lymphatic: Aortic atherosclerosis. No enlarged abdominal or pelvic lymph nodes. Reproductive: Status post hysterectomy. No adnexal masses. Other: No abdominal wall hernia or abnormality. No abdominopelvic ascites. Musculoskeletal: No acute or significant osseous findings. IMPRESSION: 1. Diffuse wall thickening in the rectum with adjacent mild fat stranding as can be seen with proctitis. 2. No CT finding to explain the patient's left lower quadrant pain. 3. Lobulated contour of the liver suggesting cirrhosis. Aortic Atherosclerosis (ICD10-I70.0). Electronically Signed   By: Emmaline Kluver M.D.   On: 01/21/2023 17:46   CT Head Wo Contrast  Result Date: 01/21/2023 CLINICAL DATA:  Mental status change EXAM: CT HEAD WITHOUT CONTRAST TECHNIQUE: Contiguous axial images were obtained from the base of the skull through the vertex without intravenous contrast. RADIATION DOSE REDUCTION: This exam was performed according to the departmental dose-optimization program which includes automated exposure control, adjustment of the mA and/or kV according to patient size and/or use of iterative reconstruction technique. COMPARISON:  CT of the brain December 31, 2022 FINDINGS: Brain: A large infarct involving the left occipital lobe extending into the left temporal lobe is stable. White matter changes are noted. No acute ischemia noted. No mass identified. No subdural, epidural, or subarachnoid hemorrhage is identified. Vascular: No hyperdense vessel or unexpected calcification. Skull: Normal. Negative for fracture or focal lesion. Sinuses/Orbits: No acute finding. Other: None. IMPRESSION: 1. Old left occipital and temporal lobe infarct. 2. No acute abnormalities. 3. White matter changes. Electronically Signed   By: Gerome Sam III M.D.   On: 01/21/2023 17:40   MR BRAIN WO CONTRAST  Result Date: 01/01/2023 CLINICAL DATA:  Transient ischemic attack EXAM: MRI  HEAD WITHOUT CONTRAST TECHNIQUE: Multiplanar, multiecho pulse sequences of the brain and surrounding structures were obtained without intravenous contrast. COMPARISON:  10/15/2022 FINDINGS: Brain: Small foci of acute ischemia within the right frontal corona radiata and the inferior margin of the right lentiform nucleus. Old bilateral PCA territory infarcts, left-greater-than-right. Chronic microhemorrhage within the central pons. There is confluent hyperintense T2-weighted signal within the white matter. Generalized volume loss. The midline structures are normal. Vascular: Major flow voids are preserved. Skull and upper cervical spine: Normal calvarium and skull base. Visualized upper cervical spine and soft tissues are normal. Sinuses/Orbits:No paranasal sinus fluid levels or advanced mucosal thickening. No mastoid or middle ear effusion. Normal orbits. IMPRESSION: 1. Small foci of acute ischemia within the right frontal corona radiata and the inferior margin of the right lentiform nucleus. No hemorrhage or mass effect. 2. Old bilateral PCA territory infarcts, left-greater-than-right. Electronically Signed   By: Deatra Robinson M.D.   On: 01/01/2023 03:25   CT ANGIO HEAD NECK W WO  CM  Result Date: 01/01/2023 CLINICAL DATA:  Transient ischemic attack EXAM: CT ANGIOGRAPHY HEAD AND NECK WITH AND WITHOUT CONTRAST TECHNIQUE: Multidetector CT imaging of the head and neck was performed using the standard protocol during bolus administration of intravenous contrast. Multiplanar CT image reconstructions and MIPs were obtained to evaluate the vascular anatomy. Carotid stenosis measurements (when applicable) are obtained utilizing NASCET criteria, using the distal internal carotid diameter as the denominator. RADIATION DOSE REDUCTION: This exam was performed according to the departmental dose-optimization program which includes automated exposure control, adjustment of the mA and/or kV according to patient size and/or use  of iterative reconstruction technique. CONTRAST:  60mL OMNIPAQUE IOHEXOL 350 MG/ML SOLN COMPARISON:  10/15/2022 MRA head FINDINGS: CTA NECK FINDINGS SKELETON: There is no bony spinal canal stenosis. No lytic or blastic lesion. OTHER NECK: Normal pharynx, larynx and major salivary glands. No cervical lymphadenopathy. Unremarkable thyroid gland. UPPER CHEST: No pneumothorax or pleural effusion. No nodules or masses. AORTIC ARCH: There is no calcific atherosclerosis of the aortic arch. There is no aneurysm, dissection or hemodynamically significant stenosis of the visualized portion of the aorta. Conventional 3 vessel aortic branching pattern. The visualized proximal subclavian arteries are widely patent. RIGHT CAROTID SYSTEM: Normal without aneurysm, dissection or stenosis. LEFT CAROTID SYSTEM: Normal without aneurysm, dissection or stenosis. VERTEBRAL ARTERIES: Left dominant configuration. Both origins are clearly patent. There is no dissection, occlusion or flow-limiting stenosis to the skull base (V1-V3 segments). CTA HEAD FINDINGS POSTERIOR CIRCULATION: --Vertebral arteries: Normal V4 segments. --Inferior cerebellar arteries: Normal. --Basilar artery: Normal. --Superior cerebellar arteries: Normal. 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 Left PCA is occluded, unchanged on 10/15/2022. ANTERIOR CIRCULATION: --Intracranial internal carotid arteries: Normal. --Anterior cerebral arteries (ACA): Short segment occlusion of the left A2 segment, which is patent distally. Unchanged compared to 10/15/2022. 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 VENOUS SINUSES: As permitted by contrast timing, patent. ANATOMIC VARIANTS: None Review of the MIP images confirms the above findings. IMPRESSION: 1. No emergent large vessel occlusion. 2. Short segment occlusion of the left anterior cerebral artery A2 segment,  which is patent distally. Unchanged compared to 10/15/2022. 3. 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 4. Occlusion of the left PCA, unchanged compared to 10/15/2022. 5. No hemodynamically significant stenosis of the neck. Electronically Signed   By: Deatra Robinson M.D.   On: 01/01/2023 02:56   DG Chest Portable 1 View  Result Date: 01/01/2023 CLINICAL DATA:  Altered mental status. EXAM: PORTABLE CHEST 1 VIEW COMPARISON:  October 14, 2022 FINDINGS: The heart size and mediastinal contours are within normal limits. Both lungs are clear. The visualized skeletal structures are unremarkable. IMPRESSION: No active disease. Electronically Signed   By: Aram Candela M.D.   On: 01/01/2023 00:38   CT HEAD CODE STROKE WO CONTRAST  Result Date: 12/31/2022 CLINICAL DATA:  Code stroke.  Confusion and aphasia EXAM: CT HEAD WITHOUT CONTRAST TECHNIQUE: Contiguous axial images were obtained from the base of the skull through the vertex without intravenous contrast. RADIATION DOSE REDUCTION: This exam was performed according to the departmental dose-optimization program which includes automated exposure control, adjustment of the mA and/or kV according to patient size and/or use of iterative reconstruction technique. COMPARISON:  None Available. FINDINGS: Brain: There is no mass, hemorrhage or extra-axial collection. The size and configuration of the ventricles and extra-axial CSF spaces are normal. There is hypoattenuation of the periventricular white matter, most commonly  indicating chronic ischemic microangiopathy. Large, old left PCA territory infarct. Small old right occipital infarct. Vascular: No abnormal hyperdensity of the major intracranial arteries or dural venous sinuses. No intracranial atherosclerosis. Skull: The visualized skull base, calvarium and extracranial soft tissues are normal. Sinuses/Orbits: No fluid levels or advanced mucosal thickening of the  visualized paranasal sinuses. No mastoid or middle ear effusion. The orbits are normal. ASPECTS [facility] Stroke Program Early CT Score) - Ganglionic level infarction (caudate, lentiform nuclei, internal capsule, insula, M1-M3 cortex): 7 - Supraganglionic infarction (M4-M6 cortex): 3 Total score (0-10 with 10 being normal): 10 IMPRESSION: 1. No acute intracranial abnormality. 2. Large, old left PCA territory infarct and small old right occipital infarct. 3. ASPECTS is 10. These results were called by telephone at the time of interpretation on 12/31/2022 at 11:53 pm to provider [facility] , who verbally acknowledged these results. Electronically Signed   By: Deatra Robinson M.D.   On: 12/31/2022 23:53   (Echo, Carotid, EGD, Colonoscopy, ERCP)    Subjective: Patient seen in the morning rounds.  Sitting at the edge of the bed.  Denies any abdominal pain or nausea.   Discharge Exam: Vitals:   01/25/23 0044 01/25/23 0805  BP: 139/61 131/67  Pulse: 71 76  Resp: 18 16  Temp: 98.7 F (37.1 C) (!) 97.5 F (36.4 C)  SpO2: 100% 100%   Vitals:   01/24/23 1652 01/25/23 0044 01/25/23 0457 01/25/23 0805  BP: 135/79 139/61  131/67  Pulse: 80 71  76  Resp: 16 18  16   Temp: (!) 97.5 F (36.4 C) 98.7 F (37.1 C)  (!) 97.5 F (36.4 C)  TempSrc:  Oral    SpO2: 100% 100%  100%  Weight:   75 kg   Height:        General: Pt is alert, awake, not in acute distress Frail.  Looks comfortable sitting up. Patient is not able to answer most of the questions.  She is oriented to herself.  She has flat affect. Right upper and lower extremity with spastic hemiplegia. Cardiovascular: RRR, S1/S2 +, no rubs, no gallops Respiratory: CTA bilaterally, no wheezing, no rhonchi Abdominal: Soft, NT, ND, bowel sounds + Extremities: no edema, no cyanosis    The results of significant diagnostics from this hospitalization (including imaging, microbiology, ancillary and laboratory) are listed below for reference.      Microbiology: Recent Results (from the past 240 hour(s))  Blood Culture (routine x 2)     Status: None (Preliminary result)   Collection Time: 01/21/23  3:33 PM   Specimen: BLOOD  Result Value Ref Range Status   Specimen Description BLOOD LEFT ANTECUBITAL  Final   Special Requests   Final    BOTTLES DRAWN AEROBIC AND ANAEROBIC Blood Culture adequate volume   Culture   Final    NO GROWTH 4 DAYS Performed at [facility], [address] [place], [place] [address]    Report Status PENDING  Incomplete  Blood Culture (routine x 2)     Status: None (Preliminary result)   Collection Time: 01/21/23  3:33 PM   Specimen: BLOOD  Result Value Ref Range Status   Specimen Description BLOOD BLOOD RIGHT FOREARM  Final   Special Requests   Final    BOTTLES DRAWN AEROBIC AND ANAEROBIC Blood Culture adequate volume   Culture   Final    NO GROWTH 4 DAYS Performed at [facility], [address], [place], [place] [address]    Report Status  PENDING  Incomplete     Labs: BNP (last 3 results) Recent Labs    10/14/22 1429  BNP 28.2   Basic Metabolic Panel: Recent Labs  Lab 01/21/23 1518 01/22/23 0333 01/23/23 0446 01/24/23 0633  NA 138 138 138 139  K 3.6 3.7 3.7 4.1  CL 102 107 108 107  CO2 22 24 25 23   GLUCOSE 193* 267* 175* 92  BUN 22 18 17 19   CREATININE 2.09* 1.78* 1.72* 1.88*  CALCIUM 9.0 8.0* 8.7* 8.8*   Liver Function Tests: Recent Labs  Lab 01/21/23 1518 01/22/23 0333  AST 26 19  ALT 15 12  ALKPHOS 107 80  BILITOT 0.8 0.6  PROT 9.0* 6.8  ALBUMIN 3.8 2.8*   No results for input(s): "LIPASE", "AMYLASE" in the last 168 hours. Recent Labs  Lab 01/24/23 0633  AMMONIA 27   CBC: Recent Labs  Lab 01/21/23 1518 01/22/23 0333 01/23/23 0446  WBC 6.7 5.2 5.4  HGB 12.7 9.9* 10.7*  HCT 38.7 30.0* 32.7*  MCV 87.0 86.7 86.7  PLT 311 259 294   Cardiac Enzymes: No results for input(s): "CKTOTAL", "CKMB", "CKMBINDEX", "TROPONINI" in the last  168 hours. BNP: Invalid input(s): "POCBNP" CBG: Recent Labs  Lab 01/23/23 2136 01/24/23 0808 01/24/23 1208 01/24/23 2137 01/25/23 0815  GLUCAP 271* 115* 118* 363* 115*   D-Dimer No results for input(s): "DDIMER" in the last 72 hours. Hgb A1c No results for input(s): "HGBA1C" in the last 72 hours. Lipid Profile No results for input(s): "CHOL", "HDL", "LDLCALC", "TRIG", "CHOLHDL", "LDLDIRECT" in the last 72 hours. Thyroid function studies No results for input(s): "TSH", "T4TOTAL", "T3FREE", "THYROIDAB" in the last 72 hours.  Invalid input(s): "FREET3" Anemia work up No results for input(s): "VITAMINB12", "FOLATE", "FERRITIN", "TIBC", "IRON", "RETICCTPCT" in the last 72 hours. Urinalysis    Component Value Date/Time   COLORURINE YELLOW (A) 01/21/2023 1533   APPEARANCEUR CLEAR (A) 01/21/2023 1533   APPEARANCEUR Clear 12/06/2014 0133   LABSPEC 1.025 01/21/2023 1533   LABSPEC 1.011 12/06/2014 0133   PHURINE 5.5 01/21/2023 1533   GLUCOSEU NEGATIVE 01/21/2023 1533   GLUCOSEU >=500 12/06/2014 0133   HGBUR TRACE (A) 01/21/2023 1533   BILIRUBINUR NEGATIVE 01/21/2023 1533   BILIRUBINUR Negative 12/06/2014 0133   KETONESUR NEGATIVE 01/21/2023 1533   PROTEINUR 100 (A) 01/21/2023 1533   NITRITE NEGATIVE 01/21/2023 1533   LEUKOCYTESUR NEGATIVE 01/21/2023 1533   LEUKOCYTESUR Negative 12/06/2014 0133   Sepsis Labs Recent Labs  Lab 01/21/23 1518 01/22/23 0333 01/23/23 0446  WBC 6.7 5.2 5.4   Microbiology Recent Results (from the past 240 hour(s))  Blood Culture (routine x 2)     Status: None (Preliminary result)   Collection Time: 01/21/23  3:33 PM   Specimen: BLOOD  Result Value Ref Range Status   Specimen Description BLOOD LEFT ANTECUBITAL  Final   Special Requests   Final    BOTTLES DRAWN AEROBIC AND ANAEROBIC Blood Culture adequate volume   Culture   Final    NO GROWTH 4 DAYS Performed at [facility], [address], [place], [place] [address]    Report  Status PENDING  Incomplete  Blood Culture (routine x 2)     Status: None (Preliminary result)   Collection Time: 01/21/23  3:33 PM   Specimen: BLOOD  Result Value Ref Range Status   Specimen Description BLOOD BLOOD RIGHT FOREARM  Final   Special Requests   Final    BOTTLES DRAWN AEROBIC AND ANAEROBIC Blood Culture adequate volume  Culture   Final    NO GROWTH 4 DAYS Performed at [facility], [address], [place], [place] [address]    Report Status PENDING  Incomplete     Time coordinating discharge: 35 minutes  SIGNED:   Dorcas Carrow, MD  [facility] 01/25/2023, 9:41 AM

## 2023-01-25 NOTE — Care Management Important Message (Signed)
Important Message  Patient Details  Name: [name] Gilbert MRN: [id number] Date of Birth: [birth year]   Medicare Important Message Given:  Yes     [name] 01/25/2023, 10:36 AM

## 2023-01-25 NOTE — TOC Transition Note (Signed)
Transition of Care [facility]) - CM/SW Discharge Note   Patient Details  Name: [name] Gilbert MRN: [id number] Date of Birth: 04/13/1961  Transition of Care [facility]) CM/SW Contact:  Garret Reddish, RN Phone Number: 01/25/2023, 10:20 AM   Clinical Narrative:  Chart reviewed.  Noted that patient will be a discharge for today.  I have spoken to [name] and he reports that he gets off work today at 3 pm and will be home after this time.  I have informed Mr. [name] that I will arranged [facility] to transport patient home today.    [name] has requested to have Home Health services arranged via [facility]. I have asked [facility] to accept Home Health referral.  I have given Huntley Dec, [facility] team member the Home Health referral.  Home Health orders are for home health PT, OT, in home aid, and Child psychotherapist.  Huntley Dec reports home care services will start within 48 hours.    I have spoken with Keon with [employer].  He informs me that Hospital bed is scheduled for delivery to patient's home today.   I have informed staff nurse of the above information.        Final next level of care: Home w Home Health Services Barriers to Discharge: No Barriers Identified   Patient Goals and CMS Choice CMS Medicare.gov Compare Post Acute Care list provided to:: Patient Choice offered to / list presented to : Patient, Spouse  Discharge Placement                    Name of family member notified: [name] Patient and family notified of of transfer: 01/25/23  Discharge Plan and Services Additional resources added to the After Visit Summary for                  DME Arranged: Hospital bed DME Agency: [employer] Date DME Agency Contacted: 01/23/23   Representative spoke with at DME Agency: [name] HH Arranged: PT, OT, Nurse's Aide, Social Work [facility] Agency: [facility] Date HH Agency Contacted: 01/25/23 Time HH Agency Contacted: 1000 Representative spoke with at [facility] Agency: Huntley Dec  Social  Determinants of Health (SDOH) Interventions SDOH Screenings   Food Insecurity: No Food Insecurity (01/24/2023)  Housing: Low Risk  (01/24/2023)  Transportation Needs: No Transportation Needs (01/24/2023)  Utilities: Not At Risk (01/24/2023)  Tobacco Use: Low Risk  (01/21/2023)     Readmission Risk Interventions     No data to display

## 2023-01-25 NOTE — Plan of Care (Signed)
  Problem: Education: Goal: Knowledge of disease or condition will improve Outcome: Progressing Goal: Knowledge of secondary prevention will improve (MUST DOCUMENT ALL) Outcome: Progressing Goal: Knowledge of patient specific risk factors will improve (Mark N/A or DELETE if not current risk factor) Outcome: Progressing   Problem: Ischemic Stroke/TIA Tissue Perfusion: Goal: Complications of ischemic stroke/TIA will be minimized Outcome: Progressing   Problem: Coping: Goal: Will verbalize positive feelings about self Outcome: Progressing Goal: Will identify appropriate support needs Outcome: Progressing   Problem: Health Behavior/Discharge Planning: Goal: Ability to manage health-related needs will improve Outcome: Progressing Goal: Goals will be collaboratively established with patient/family Outcome: Progressing   Problem: Self-Care: Goal: Ability to participate in self-care as condition permits will improve Outcome: Progressing Goal: Verbalization of feelings and concerns over difficulty with self-care will improve Outcome: Progressing Goal: Ability to communicate needs accurately will improve Outcome: Progressing   Problem: Nutrition: Goal: Risk of aspiration will decrease Outcome: Progressing Goal: Dietary intake will improve Outcome: Progressing   Problem: Education: Goal: Knowledge of General Education information will improve Description: Including pain rating scale, medication(s)/side effects and non-pharmacologic comfort measures Outcome: Progressing   Problem: Health Behavior/Discharge Planning: Goal: Ability to manage health-related needs will improve Outcome: Progressing   Problem: Clinical Measurements: Goal: Ability to maintain clinical measurements within normal limits will improve Outcome: Progressing Goal: Will remain free from infection Outcome: Progressing Goal: Diagnostic test results will improve Outcome: Progressing Goal: Respiratory  complications will improve Outcome: Progressing Goal: Cardiovascular complication will be avoided Outcome: Progressing   Problem: Activity: Goal: Risk for activity intolerance will decrease Outcome: Progressing   Problem: Nutrition: Goal: Adequate nutrition will be maintained Outcome: Progressing   Problem: Coping: Goal: Level of anxiety will decrease Outcome: Progressing   Problem: Elimination: Goal: Will not experience complications related to bowel motility Outcome: Progressing Goal: Will not experience complications related to urinary retention Outcome: Progressing   Problem: Pain Managment: Goal: General experience of comfort will improve Outcome: Progressing   Problem: Safety: Goal: Ability to remain free from injury will improve Outcome: Progressing   Problem: Skin Integrity: Goal: Risk for impaired skin integrity will decrease Outcome: Progressing   Problem: Education: Goal: Ability to describe self-care measures that may prevent or decrease complications (Diabetes Survival Skills Education) will improve Outcome: Progressing Goal: Individualized Educational Video(s) Outcome: Progressing   Problem: Coping: Goal: Ability to adjust to condition or change in health will improve Outcome: Progressing   Problem: Fluid Volume: Goal: Ability to maintain a balanced intake and output will improve Outcome: Progressing   Problem: Health Behavior/Discharge Planning: Goal: Ability to identify and utilize available resources and services will improve Outcome: Progressing Goal: Ability to manage health-related needs will improve Outcome: Progressing   Problem: Metabolic: Goal: Ability to maintain appropriate glucose levels will improve Outcome: Progressing   Problem: Nutritional: Goal: Maintenance of adequate nutrition will improve Outcome: Progressing Goal: Progress toward achieving an optimal weight will improve Outcome: Progressing   Problem: Skin  Integrity: Goal: Risk for impaired skin integrity will decrease Outcome: Progressing   Problem: Tissue Perfusion: Goal: Adequacy of tissue perfusion will improve Outcome: Progressing   

## 2023-01-25 NOTE — Consult Note (Signed)
Consultation Note Date: 01/25/2023   Patient Name: [name] Gilbert  DOB: Feb 13, 1961  MRN: [id number]  Age / Sex: 62 y.o., female  PCP: Tobey Grim, MD Referring Physician: Dorcas Carrow, MD  Reason for Consultation: Establishing goals of care  HPI/Patient Profile: 62 y.o. female  with past medical history of type 2 diabetes on insulin, CKD stage IIIb, hypertension and previous history of stroke admitted on 01/21/2023 with abdominal pain and constipation. Patient recently hospitalized for AMS and was dc'd 4/25 to SNF. This admission CT scan showed proctocolitis. PMT consulted to discuss GOC.  Clinical Assessment and Goals of Care: I have reviewed medical records including EPIC notes, labs and imaging, received report from RN, assessed the patient and then spoke with patient's spouse [name] to discuss diagnosis prognosis, GOC, EOL wishes, disposition and options.  I introduced Palliative Medicine as specialized medical care for people living with serious illness. It focuses on providing relief from the symptoms and stress of a serious illness. The goal is to improve quality of life for both the patient and the family.  [name] shares with me he is at work and really doesn't have time for much conversation but is will to talk briefly. Also, patient is getting discharged today and he is preparing for her discharge home.  [name] shares with me at baseline patient is slow to respond and has cognitive impairment. She has been clear in the past about her wishes for DNR/DNI.  During previous goals of care discussion with patient on 02/01/2022 patient's was clear she would never want aggressive measures such as CPR, ventilators, or dialysis.   Discussed with spouse the importance of continued conversation with family and the medical providers regarding overall plan of care and treatment options, ensuring decisions are within the context of the patient's  values and GOCs.    Palliative Care services outpatient were explained and offered. Spouse is agreeable to outpatient palliative.  Questions and concerns were addressed. The family was encouraged to call with questions or concerns.    Primary Decision Maker NEXT OF KIN - spouse [name]    SUMMARY OF RECOMMENDATIONS   - referral to outpatient palliative - goals established previously: no CPR, ventilator, or HD - patient for dc home with home health today  Code Status/Advance Care Planning: DNR   Symptom Management:  Patient denies any needs     Primary Diagnoses: Present on Admission:  Abdominal pain  (Resolved) Chronic diastolic CHF (congestive heart failure) (HCC)  Primary hypertension  (Resolved) HTN (hypertension)  Type II diabetes mellitus with renal manifestations (HCC)  Acute proctitis  Acute renal failure superimposed on stage 3b chronic kidney disease (HCC)   I have reviewed the medical record, interviewed the patient and family, and examined the patient. The following aspects are pertinent.  Past Medical History:  Diagnosis Date   Diabetes mellitus without complication (HCC)    Hypertension    Stroke (HCC)    visual impaired    Social History   Socioeconomic History   Marital status: Married    Spouse name: Not on file   Number of children: Not on file   Years of education: Not on file   Highest education level: Not on file  Occupational History   Occupation: home maker  Tobacco Use   Smoking status: Never    Passive exposure: Never   Smokeless tobacco: Never  Vaping Use   Vaping Use: Never used  Substance and Sexual Activity   Alcohol use: No   Drug  use: No   Sexual activity: Not Currently  Other Topics Concern   Not on file  Social History Narrative   Not on file   Social Determinants of Health   Financial Resource Strain: Not on file  Food Insecurity: No Food Insecurity (01/24/2023)   Hunger Vital Sign    Worried About Running Out  of Food in the Last Year: Never true    Ran Out of Food in the Last Year: Never true  Transportation Needs: No Transportation Needs (01/24/2023)   PRAPARE - Administrator, Civil Service (Medical): No    Lack of Transportation (Non-Medical): No  Physical Activity: Not on file  Stress: Not on file  Social Connections: Not on file   Family History  Problem Relation Age of Onset   Diabetes Father    Scheduled Meds:  amLODipine  10 mg Oral Daily   aspirin EC  81 mg Oral Daily   bisacodyl  10 mg Rectal QPM   clopidogrel  75 mg Oral Daily   docusate sodium  200 mg Oral Daily   feeding supplement  237 mL Oral TID BM   heparin  5,000 Units Subcutaneous Q8H   insulin aspart  0-5 Units Subcutaneous QHS   insulin aspart  0-6 Units Subcutaneous TID WC   insulin glargine-yfgn  10 Units Subcutaneous QHS   levETIRAcetam  250 mg Oral BID   metoprolol succinate  25 mg Oral Daily   multivitamin with minerals  1 tablet Oral Daily   polyethylene glycol  17 g Oral BID   rosuvastatin  20 mg Oral QHS   senna-docusate  2 tablet Oral QHS   sodium chloride flush  3 mL Intravenous Q12H   Continuous Infusions: PRN Meds:.acetaminophen **OR** acetaminophen, hydrALAZINE, pramoxine-hydrocortisone Allergies  Allergen Reactions   Metformin Other (See Comments)    Reaction: unknown   Penicillins Hives, Nausea And Vomiting and Swelling    Has patient had a PCN reaction causing immediate rash, facial/tongue/throat swelling, SOB or lightheadedness with hypotension: Yes Has patient had a PCN reaction causing severe rash involving mucus membranes or skin necrosis: No Has patient had a PCN reaction that required hospitalization Yes Has patient had a PCN reaction occurring within the last 10 years: No If all of the above answers are "NO", then may proceed with Cephalosporin use.    Codeine Other (See Comments)    Reaction: HIVES   Hydralazine     Stomach pain   Statins Other (See Comments)     Myopathy with atorva 80   Review of Systems  Unable to perform ROS: Mental status change    Physical Exam Constitutional:      General: She is not in acute distress.    Comments: Very slow to respond  Pulmonary:     Effort: Pulmonary effort is normal.  Musculoskeletal:     Right lower leg: No edema.     Left lower leg: No edema.  Skin:    General: Skin is warm and dry.  Neurological:     Mental Status: She is alert. She is disoriented.     Vital Signs: BP 131/67   Pulse 76   Temp (!) 97.5 F (36.4 C)   Resp 16   Ht 5\' 2"  (1.575 m)   Wt 75 kg   SpO2 100%   BMI 30.24 kg/m  Pain Scale: Faces   Pain Score: 0-No pain   SpO2: SpO2: 100 % O2 Device:SpO2: 100 % O2 Flow Rate: .  IO: Intake/output summary:  Intake/Output Summary (Last 24 hours) at 01/25/2023 1228 Last data filed at 01/25/2023 0455 Gross per 24 hour  Intake --  Output 350 ml  Net -350 ml    LBM: Last BM Date : 01/23/23 Baseline Weight: Weight: 75 kg Most recent weight: Weight: 75 kg     Palliative Assessment/Data: PPS 30%     *Please note that this is a verbal dictation therefore any spelling or grammatical errors are due to the "Dragon Medical One" system interpretation.  Gerlean Ren, DNP, AGNP-C Palliative Medicine Team [phone] Pager: [phone]

## 2023-01-26 LAB — CULTURE, BLOOD (ROUTINE X 2)
Culture: NO GROWTH
Special Requests: ADEQUATE
Special Requests: ADEQUATE

## 2023-01-30 LAB — HEPATITIS B SURFACE ANTIGEN

## 2023-02-01 LAB — HEPATITIS B CORE ANTIBODY, TOTAL

## 2023-02-21 ENCOUNTER — Telehealth: Payer: Self-pay

## 2023-02-21 NOTE — Telephone Encounter (Signed)
PC SW LVM for patient/spouse to schedule initial palliative care consultation.

## 2023-02-24 ENCOUNTER — Emergency Department
Admission: EM | Admit: 2023-02-24 | Discharge: 2023-02-24 | Disposition: A | Discharge status: Home / Self Care | Payer: Medicare HMO | Source: Home / Self Care | Attending: Emergency Medicine | Admitting: Emergency Medicine

## 2023-02-24 ENCOUNTER — Emergency Department: Payer: Medicare HMO

## 2023-02-24 DIAGNOSIS — R4701 Aphasia: Secondary | ICD-10-CM | POA: Diagnosis not present

## 2023-02-24 DIAGNOSIS — K5900 Constipation, unspecified: Secondary | ICD-10-CM | POA: Insufficient documentation

## 2023-02-24 DIAGNOSIS — N390 Urinary tract infection, site not specified: Secondary | ICD-10-CM | POA: Insufficient documentation

## 2023-02-24 DIAGNOSIS — I6300 Cerebral infarction due to thrombosis of unspecified precerebral artery: Secondary | ICD-10-CM | POA: Diagnosis not present

## 2023-02-24 LAB — CBC WITH DIFFERENTIAL/PLATELET
Abs Immature Granulocytes: 0.03 10*3/uL (ref 0.00–0.07)
Basophils Absolute: 0 10*3/uL (ref 0.0–0.1)
Basophils Relative: 0 %
Eosinophils Absolute: 0.1 10*3/uL (ref 0.0–0.5)
Eosinophils Relative: 1 %
HCT: 33.8 % — ABNORMAL LOW (ref 36.0–46.0)
Hemoglobin: 11.4 g/dL — ABNORMAL LOW (ref 12.0–15.0)
Immature Granulocytes: 1 %
Lymphocytes Relative: 23 %
Lymphs Abs: 1.5 10*3/uL (ref 0.7–4.0)
MCH: 29.3 pg (ref 26.0–34.0)
MCHC: 33.7 g/dL (ref 30.0–36.0)
MCV: 86.9 fL (ref 80.0–100.0)
Monocytes Absolute: 0.4 10*3/uL (ref 0.1–1.0)
Monocytes Relative: 7 %
Neutro Abs: 4.3 10*3/uL (ref 1.7–7.7)
Neutrophils Relative %: 68 %
Platelets: 270 10*3/uL (ref 150–400)
RBC: 3.89 MIL/uL (ref 3.87–5.11)
RDW: 14.5 % (ref 11.5–15.5)
WBC: 6.3 10*3/uL (ref 4.0–10.5)
nRBC: 0 % (ref 0.0–0.2)

## 2023-02-24 LAB — COMPREHENSIVE METABOLIC PANEL
ALT: 15 U/L (ref 0–44)
AST: 27 U/L (ref 15–41)
Albumin: 3.8 g/dL (ref 3.5–5.0)
Alkaline Phosphatase: 78 U/L (ref 38–126)
Anion gap: 11 (ref 5–15)
BUN: 26 mg/dL — ABNORMAL HIGH (ref 8–23)
CO2: 22 mmol/L (ref 22–32)
Calcium: 8.6 mg/dL — ABNORMAL LOW (ref 8.9–10.3)
Chloride: 101 mmol/L (ref 98–111)
Creatinine, Ser: 1.63 mg/dL — ABNORMAL HIGH (ref 0.44–1.00)
GFR, Estimated: 35 mL/min — ABNORMAL LOW (ref >60)
Glucose, Bld: 212 mg/dL — ABNORMAL HIGH (ref 70–99)
Potassium: 3.4 mmol/L — ABNORMAL LOW (ref 3.5–5.1)
Sodium: 134 mmol/L — ABNORMAL LOW (ref 135–145)
Total Bilirubin: 1 mg/dL (ref 0.3–1.2)
Total Protein: 8 g/dL (ref 6.5–8.1)

## 2023-02-24 LAB — URINALYSIS, ROUTINE W REFLEX MICROSCOPIC
Bilirubin Urine: NEGATIVE
Glucose, UA: NEGATIVE mg/dL
Ketones, ur: NEGATIVE mg/dL
Leukocytes,Ua: NEGATIVE
Nitrite: NEGATIVE
Protein, ur: 100 mg/dL — AB
Specific Gravity, Urine: 1.004 — ABNORMAL LOW (ref 1.005–1.030)
pH: 5 (ref 5.0–8.0)

## 2023-02-24 LAB — LACTIC ACID, PLASMA: Lactic Acid, Venous: 1.6 mmol/L (ref 0.5–1.9)

## 2023-02-24 LAB — LIPASE, BLOOD: Lipase: 24 U/L (ref 11–51)

## 2023-02-24 MED ORDER — SULFAMETHOXAZOLE-TRIMETHOPRIM 800-160 MG PO TABS
1.0000 | ORAL_TABLET | Freq: Once | ORAL | Status: AC
  Administered 2023-02-24: 1 via ORAL
  Filled 2023-02-24: qty 1

## 2023-02-24 MED ORDER — SULFAMETHOXAZOLE-TRIMETHOPRIM 800-160 MG PO TABS: 1.0000 | ORAL_TABLET | Freq: Two times a day (BID) | ORAL | 0 refills | Status: DC

## 2023-02-24 NOTE — ED Notes (Signed)
Imminent arrival expected [facility], pt/family updated.

## 2023-02-24 NOTE — ED Triage Notes (Signed)
Arrives via [facility] from home for abdominal pain per family.  Patient has not had substantial BM in approximately 2 weeks.  Hx of CVA x2 with bilateral deficits, patient is non-verbal at baseline.

## 2023-02-24 NOTE — ED Notes (Signed)
Son at [facility]. Continues to wait on transport, pending arrival.

## 2023-02-24 NOTE — ED Notes (Signed)
VSS. Son at [facility]. Denies questions or needs, sx or complaints. eRx sent.

## 2023-02-24 NOTE — ED Notes (Signed)
[facility] called for transport to home 

## 2023-02-24 NOTE — Discharge Instructions (Addendum)
You may use one half capful twice a day of MiraLAX in order to have 1 solid well-formed bowel movement per day.  You may increase or decrease this dosage as needed to obtain this 1 well-formed bowel movement.  Please make sure that you are drinking at least 8 ounces of water every hour during this initial bowel regimen. 

## 2023-02-24 NOTE — ED Provider Notes (Signed)
[facility] Provider Note   Event Date/Time   First MD Initiated Contact with Patient 02/24/23 0255     (approximate) History  Abdominal Pain  HPI [name] Gilbert is a 62 y.o. female who presents for abdominal pain for the last 2 weeks.  Patient also states that she has not had a subs Anshul bowel movements over the last 3 weeks as well.  Patient states that she has been using MiraLAX and stool softeners with little to no effect on her stool quality.  Patient denies any radiation of this pain and denies any exacerbating or relieving factors. ROS: Patient currently denies any vision changes, tinnitus, difficulty speaking, facial droop, sore throat, chest pain, shortness of breath, nausea/vomiting/diarrhea, dysuria, or weakness/numbness/paresthesias in any extremity   Physical Exam  Triage Vital Signs: ED Triage Vitals  Enc Vitals Group     BP 02/24/23 0234 (!) 176/77     Pulse Rate 02/24/23 0234 92     Resp --      Temp 02/24/23 0234 97.7 F (36.5 C)     Temp Source 02/24/23 0234 Oral     SpO2 02/24/23 0234 100 %     Weight 02/24/23 0232 146 lb (66.2 kg)     Height --      Head Circumference --      Peak Flow --      Pain Score --      Pain Loc --      Pain Edu? --      Excl. in GC? --    Most recent vital signs: Vitals:   02/24/23 0234 02/24/23 0649  BP: (!) 176/77   Pulse: 92   Resp:  18  Temp: 97.7 F (36.5 C)   SpO2: 100%    General: Awake, oriented x4. CV:  Good peripheral perfusion.  Resp:  Normal effort.  Abd:  No distention.  Nontender to palpation Other:  Middle-aged overweight African-American female laying in bed in no acute distress ED Results / Procedures / Treatments  Labs (all labs ordered are listed, but only abnormal results are displayed) Labs Reviewed  COMPREHENSIVE METABOLIC PANEL - Abnormal; Notable for the following components:      Result Value   Sodium 134 (*)    Potassium 3.4 (*)    Glucose, Bld 212 (*)    BUN  26 (*)    Creatinine, Ser 1.63 (*)    Calcium 8.6 (*)    GFR, Estimated 35 (*)    All other components within normal limits  CBC WITH DIFFERENTIAL/PLATELET - Abnormal; Notable for the following components:   Hemoglobin 11.4 (*)    HCT 33.8 (*)    All other components within normal limits  URINALYSIS, ROUTINE W REFLEX MICROSCOPIC - Abnormal; Notable for the following components:   Color, Urine STRAW (*)    APPearance CLEAR (*)    Specific Gravity, Urine 1.004 (*)    Hgb urine dipstick SMALL (*)    Protein, ur 100 (*)    Bacteria, UA RARE (*)    All other components within normal limits  LACTIC ACID, PLASMA  LIPASE, BLOOD  LACTIC ACID, PLASMA    RADIOLOGY ED MD interpretation: Single view x-ray of the abdomen shows nonobstructive bowel gas pattern with no pneumoperitoneum and relatively large volume stool throughout the colon and rectum -Agree with radiology assessment Official radiology report(s): DG Abdomen 1 View  Result Date: 02/24/2023 CLINICAL DATA:  62 year old female with history of constipation. EXAM: ABDOMEN -  1 VIEW COMPARISON:  Abdominal radiograph 11/21/2022. FINDINGS: Gas and stool are seen scattered throughout the colon extending to the level of the distal rectum. No pathologic distension of small bowel is noted. Large volume of stool in the colon and rectum. No gross evidence of pneumoperitoneum. IMPRESSION: 1. Nonobstructive bowel gas pattern. 2. No pneumoperitoneum. 3. Relatively large volume of stool throughout the colon and rectum compatible with reported clinical history of constipation. Electronically Signed   By: Trudie Reed M.D.   On: 02/24/2023 06:15   PROCEDURES: Critical Care performed: No .1-3 Lead EKG Interpretation  Performed by: Merwyn Katos, MD Authorized by: Merwyn Katos, MD     Interpretation: normal     ECG rate:  71   ECG rate assessment: normal     Rhythm: sinus rhythm     Ectopy: none     Conduction: normal    MEDICATIONS  ORDERED IN ED: Medications  sulfamethoxazole-trimethoprim (BACTRIM DS) 800-160 MG per tablet 1 tablet (1 tablet Oral Given 02/24/23 0655)   IMPRESSION / MDM / ASSESSMENT AND PLAN / ED COURSE  I reviewed the triage vital signs and the nursing notes.                             The patient is on the cardiac monitor to evaluate for evidence of arrhythmia and/or significant heart rate changes. Patient's presentation is most consistent with acute presentation with potential threat to life or bodily function. Patients history and exam most consistent with constipation as an etiology for their pain.  Patients symptoms not typical for other emergent causes of abdominal pain such as, but not limited to, appendicitis, abdominal aortic aneurysm, pancreatitis, SBO, mesenteric ischemia, serious intra-abdominal bacterial illness.  Patient without red flags concerning for cancer as a constipation etiology. UA also showing signs of bacteria and given patient's description of pain in the suprapubic region will cover empirically for UTI Rx: Miralax, cefdinir  Disposition:  Patient will be discharged with strict return precautions and follow up with primary MD within 24-48 hours for further evaluation. Patient understands that this still may have an early presentation of an emergent medical condition such as appendicitis that will require a recheck. Clinical Course as of 02/24/23 0719  Sat Feb 24, 2023  0619 DG Abdomen 1 View [EB]    Clinical Course User Index [EB] Merwyn Katos, MD   FINAL CLINICAL IMPRESSION(S) / ED DIAGNOSES   Final diagnoses:  Lower urinary tract infectious disease  Constipation, unspecified constipation type   Rx / DC Orders   ED Discharge Orders          Ordered    sulfamethoxazole-trimethoprim (BACTRIM DS) 800-160 MG tablet  2 times daily        02/24/23 1610           Note:  This document was prepared using Dragon voice recognition software and may include  unintentional dictation errors.   Merwyn Katos, MD 02/24/23 [phone]

## 2023-02-25 ENCOUNTER — Emergency Department: Payer: Medicare HMO

## 2023-02-25 ENCOUNTER — Encounter: Payer: Self-pay | Admitting: Emergency Medicine

## 2023-02-25 ENCOUNTER — Other Ambulatory Visit: Payer: Self-pay

## 2023-02-25 ENCOUNTER — Inpatient Hospital Stay
Admission: EM | Admit: 2023-02-25 | Discharge: 2023-03-12 | DRG: 065 | Disposition: A | Discharge status: Skilled Nursing Facility | Payer: Medicare HMO | Attending: Internal Medicine | Admitting: Internal Medicine

## 2023-02-25 ENCOUNTER — Observation Stay: Payer: Medicare HMO

## 2023-02-25 DIAGNOSIS — I10 Essential (primary) hypertension: Secondary | ICD-10-CM

## 2023-02-25 DIAGNOSIS — E162 Hypoglycemia, unspecified: Secondary | ICD-10-CM | POA: Diagnosis not present

## 2023-02-25 DIAGNOSIS — E1122 Type 2 diabetes mellitus with diabetic chronic kidney disease: Secondary | ICD-10-CM | POA: Diagnosis present

## 2023-02-25 DIAGNOSIS — N179 Acute kidney failure, unspecified: Secondary | ICD-10-CM | POA: Diagnosis present

## 2023-02-25 DIAGNOSIS — K7460 Unspecified cirrhosis of liver: Secondary | ICD-10-CM | POA: Diagnosis present

## 2023-02-25 DIAGNOSIS — Z794 Long term (current) use of insulin: Secondary | ICD-10-CM

## 2023-02-25 DIAGNOSIS — R4701 Aphasia: Secondary | ICD-10-CM

## 2023-02-25 DIAGNOSIS — Z751 Person awaiting admission to adequate facility elsewhere: Secondary | ICD-10-CM

## 2023-02-25 DIAGNOSIS — R4702 Dysphasia: Secondary | ICD-10-CM | POA: Diagnosis present

## 2023-02-25 DIAGNOSIS — D649 Anemia, unspecified: Secondary | ICD-10-CM | POA: Insufficient documentation

## 2023-02-25 DIAGNOSIS — R414 Neurologic neglect syndrome: Secondary | ICD-10-CM | POA: Diagnosis present

## 2023-02-25 DIAGNOSIS — I69351 Hemiplegia and hemiparesis following cerebral infarction affecting right dominant side: Secondary | ICD-10-CM

## 2023-02-25 DIAGNOSIS — Z79899 Other long term (current) drug therapy: Secondary | ICD-10-CM

## 2023-02-25 DIAGNOSIS — R29810 Facial weakness: Secondary | ICD-10-CM | POA: Diagnosis present

## 2023-02-25 DIAGNOSIS — I6300 Cerebral infarction due to thrombosis of unspecified precerebral artery: Principal | ICD-10-CM | POA: Diagnosis present

## 2023-02-25 DIAGNOSIS — Z7902 Long term (current) use of antithrombotics/antiplatelets: Secondary | ICD-10-CM

## 2023-02-25 DIAGNOSIS — K5900 Constipation, unspecified: Secondary | ICD-10-CM | POA: Diagnosis present

## 2023-02-25 DIAGNOSIS — Z66 Do not resuscitate: Secondary | ICD-10-CM | POA: Diagnosis present

## 2023-02-25 DIAGNOSIS — Z888 Allergy status to other drugs, medicaments and biological substances status: Secondary | ICD-10-CM

## 2023-02-25 DIAGNOSIS — E875 Hyperkalemia: Secondary | ICD-10-CM | POA: Diagnosis present

## 2023-02-25 DIAGNOSIS — R29727 NIHSS score 27: Secondary | ICD-10-CM | POA: Diagnosis present

## 2023-02-25 DIAGNOSIS — M6281 Muscle weakness (generalized): Secondary | ICD-10-CM

## 2023-02-25 DIAGNOSIS — N184 Chronic kidney disease, stage 4 (severe): Secondary | ICD-10-CM | POA: Diagnosis present

## 2023-02-25 DIAGNOSIS — E1165 Type 2 diabetes mellitus with hyperglycemia: Secondary | ICD-10-CM | POA: Diagnosis present

## 2023-02-25 DIAGNOSIS — I639 Cerebral infarction, unspecified: Secondary | ICD-10-CM | POA: Diagnosis not present

## 2023-02-25 DIAGNOSIS — E785 Hyperlipidemia, unspecified: Secondary | ICD-10-CM | POA: Diagnosis not present

## 2023-02-25 DIAGNOSIS — D509 Iron deficiency anemia, unspecified: Secondary | ICD-10-CM | POA: Diagnosis present

## 2023-02-25 DIAGNOSIS — N390 Urinary tract infection, site not specified: Secondary | ICD-10-CM | POA: Diagnosis present

## 2023-02-25 DIAGNOSIS — Z880 Allergy status to penicillin: Secondary | ICD-10-CM

## 2023-02-25 DIAGNOSIS — Z833 Family history of diabetes mellitus: Secondary | ICD-10-CM

## 2023-02-25 DIAGNOSIS — E1169 Type 2 diabetes mellitus with other specified complication: Secondary | ICD-10-CM

## 2023-02-25 DIAGNOSIS — R4182 Altered mental status, unspecified: Secondary | ICD-10-CM | POA: Diagnosis not present

## 2023-02-25 DIAGNOSIS — Z90710 Acquired absence of both cervix and uterus: Secondary | ICD-10-CM

## 2023-02-25 DIAGNOSIS — H547 Unspecified visual loss: Secondary | ICD-10-CM | POA: Diagnosis present

## 2023-02-25 DIAGNOSIS — I130 Hypertensive heart and chronic kidney disease with heart failure and stage 1 through stage 4 chronic kidney disease, or unspecified chronic kidney disease: Secondary | ICD-10-CM | POA: Diagnosis present

## 2023-02-25 DIAGNOSIS — R9431 Abnormal electrocardiogram [ECG] [EKG]: Secondary | ICD-10-CM | POA: Diagnosis present

## 2023-02-25 DIAGNOSIS — G8194 Hemiplegia, unspecified affecting left nondominant side: Secondary | ICD-10-CM | POA: Diagnosis present

## 2023-02-25 DIAGNOSIS — E1129 Type 2 diabetes mellitus with other diabetic kidney complication: Secondary | ICD-10-CM | POA: Diagnosis present

## 2023-02-25 DIAGNOSIS — G40909 Epilepsy, unspecified, not intractable, without status epilepticus: Secondary | ICD-10-CM | POA: Diagnosis present

## 2023-02-25 DIAGNOSIS — E11649 Type 2 diabetes mellitus with hypoglycemia without coma: Secondary | ICD-10-CM | POA: Diagnosis present

## 2023-02-25 DIAGNOSIS — E7800 Pure hypercholesterolemia, unspecified: Secondary | ICD-10-CM | POA: Diagnosis present

## 2023-02-25 DIAGNOSIS — R471 Dysarthria and anarthria: Secondary | ICD-10-CM | POA: Diagnosis present

## 2023-02-25 LAB — URINALYSIS, ROUTINE W REFLEX MICROSCOPIC
Bacteria, UA: NONE SEEN
Bilirubin Urine: NEGATIVE
Glucose, UA: >=500 mg/dL — AB
Hgb urine dipstick: NEGATIVE
Ketones, ur: 5 mg/dL — AB
Leukocytes,Ua: NEGATIVE
Nitrite: NEGATIVE
Protein, ur: 100 mg/dL — AB
Specific Gravity, Urine: 1.041 — ABNORMAL HIGH (ref 1.005–1.030)
pH: 5 (ref 5.0–8.0)

## 2023-02-25 LAB — CBC
HCT: 36.3 % (ref 36.0–46.0)
HCT: 29 % — ABNORMAL LOW (ref 36.0–46.0)
Hemoglobin: 12.4 g/dL (ref 12.0–15.0)
Hemoglobin: 9.6 g/dL — ABNORMAL LOW (ref 12.0–15.0)
MCH: 30.8 pg (ref 26.0–34.0)
MCH: 28.9 pg (ref 26.0–34.0)
MCHC: 34.2 g/dL (ref 30.0–36.0)
MCHC: 33.1 g/dL (ref 30.0–36.0)
MCV: 90.1 fL (ref 80.0–100.0)
MCV: 87.3 fL (ref 80.0–100.0)
Platelets: 210 10*3/uL (ref 150–400)
Platelets: 236 10*3/uL (ref 150–400)
RBC: 4.03 MIL/uL (ref 3.87–5.11)
RBC: 3.32 MIL/uL — ABNORMAL LOW (ref 3.87–5.11)
RDW: 14.8 % (ref 11.5–15.5)
RDW: 14.9 % (ref 11.5–15.5)
WBC: 7.6 10*3/uL (ref 4.0–10.5)
WBC: 7.2 10*3/uL (ref 4.0–10.5)
nRBC: 0 % (ref 0.0–0.2)
nRBC: 0 % (ref 0.0–0.2)

## 2023-02-25 LAB — LIPID PANEL
Cholesterol: 241 mg/dL — ABNORMAL HIGH (ref 0–200)
HDL: 88 mg/dL (ref >40)
LDL Cholesterol: 141 mg/dL — ABNORMAL HIGH (ref 0–99)
Total CHOL/HDL Ratio: 2.7 RATIO
Triglycerides: 60 mg/dL (ref <150)
VLDL: 12 mg/dL (ref 0–40)

## 2023-02-25 LAB — PROTIME-INR
INR: 1 (ref 0.8–1.2)
Prothrombin Time: 13.8 seconds (ref 11.4–15.2)

## 2023-02-25 LAB — COMPREHENSIVE METABOLIC PANEL
ALT: 15 U/L (ref 0–44)
AST: 30 U/L (ref 15–41)
Albumin: 3.8 g/dL (ref 3.5–5.0)
Alkaline Phosphatase: 76 U/L (ref 38–126)
Anion gap: 15 (ref 5–15)
BUN: 22 mg/dL (ref 8–23)
CO2: 19 mmol/L — ABNORMAL LOW (ref 22–32)
Calcium: 8.7 mg/dL — ABNORMAL LOW (ref 8.9–10.3)
Chloride: 102 mmol/L (ref 98–111)
Creatinine, Ser: 1.93 mg/dL — ABNORMAL HIGH (ref 0.44–1.00)
GFR, Estimated: 29 mL/min — ABNORMAL LOW (ref >60)
Glucose, Bld: 249 mg/dL — ABNORMAL HIGH (ref 70–99)
Potassium: 3.5 mmol/L (ref 3.5–5.1)
Sodium: 136 mmol/L (ref 135–145)
Total Bilirubin: 0.9 mg/dL (ref 0.3–1.2)
Total Protein: 8.3 g/dL — ABNORMAL HIGH (ref 6.5–8.1)

## 2023-02-25 LAB — BASIC METABOLIC PANEL
Anion gap: 12 (ref 5–15)
BUN: 22 mg/dL (ref 8–23)
CO2: 18 mmol/L — ABNORMAL LOW (ref 22–32)
Calcium: 8.2 mg/dL — ABNORMAL LOW (ref 8.9–10.3)
Chloride: 103 mmol/L (ref 98–111)
Creatinine, Ser: 1.76 mg/dL — ABNORMAL HIGH (ref 0.44–1.00)
GFR, Estimated: 32 mL/min — ABNORMAL LOW (ref >60)
Glucose, Bld: 250 mg/dL — ABNORMAL HIGH (ref 70–99)
Potassium: 3.7 mmol/L (ref 3.5–5.1)
Sodium: 133 mmol/L — ABNORMAL LOW (ref 135–145)

## 2023-02-25 LAB — URINE DRUG SCREEN, QUALITATIVE (ARMC ONLY)
Amphetamines, Ur Screen: NOT DETECTED
Barbiturates, Ur Screen: NOT DETECTED
Benzodiazepine, Ur Scrn: NOT DETECTED
Cannabinoid 50 Ng, Ur ~~LOC~~: NOT DETECTED
Cocaine Metabolite,Ur ~~LOC~~: NOT DETECTED
MDMA (Ecstasy)Ur Screen: NOT DETECTED
Methadone Scn, Ur: NOT DETECTED
Opiate, Ur Screen: NOT DETECTED
Phencyclidine (PCP) Ur S: NOT DETECTED
Tricyclic, Ur Screen: NOT DETECTED

## 2023-02-25 LAB — DIFFERENTIAL
Abs Immature Granulocytes: 0.03 10*3/uL (ref 0.00–0.07)
Basophils Absolute: 0 10*3/uL (ref 0.0–0.1)
Basophils Relative: 0 %
Eosinophils Absolute: 0.1 10*3/uL (ref 0.0–0.5)
Eosinophils Relative: 1 %
Immature Granulocytes: 0 %
Lymphocytes Relative: 22 %
Lymphs Abs: 1.6 10*3/uL (ref 0.7–4.0)
Monocytes Absolute: 0.4 10*3/uL (ref 0.1–1.0)
Monocytes Relative: 5 %
Neutro Abs: 5.4 10*3/uL (ref 1.7–7.7)
Neutrophils Relative %: 72 %

## 2023-02-25 LAB — APTT: aPTT: 27 seconds (ref 24–36)

## 2023-02-25 LAB — GLUCOSE, CAPILLARY: Glucose-Capillary: 263 mg/dL — ABNORMAL HIGH (ref 70–99)

## 2023-02-25 LAB — CBG MONITORING, ED
Glucose-Capillary: 139 mg/dL — ABNORMAL HIGH (ref 70–99)
Glucose-Capillary: 231 mg/dL — ABNORMAL HIGH (ref 70–99)
Glucose-Capillary: 267 mg/dL — ABNORMAL HIGH (ref 70–99)
Glucose-Capillary: 297 mg/dL — ABNORMAL HIGH (ref 70–99)
Glucose-Capillary: 64 mg/dL — ABNORMAL LOW (ref 70–99)
Glucose-Capillary: 84 mg/dL (ref 70–99)

## 2023-02-25 LAB — ETHANOL: Alcohol, Ethyl (B): <10 mg/dL (ref <10)

## 2023-02-25 MED ORDER — ONDANSETRON HCL 4 MG/2ML IJ SOLN: 4.0000 mg | Freq: Four times a day (QID) | INTRAMUSCULAR | Status: DC | PRN

## 2023-02-25 MED ORDER — ONDANSETRON HCL 4 MG PO TABS: 4.0000 mg | ORAL_TABLET | Freq: Four times a day (QID) | ORAL | Status: DC | PRN

## 2023-02-25 MED ORDER — ONDANSETRON HCL 4 MG/2ML IJ SOLN
INTRAMUSCULAR | Status: AC
  Administered 2023-02-25: 4 mg via INTRAVENOUS
  Filled 2023-02-25: qty 2

## 2023-02-25 MED ORDER — CLOPIDOGREL BISULFATE 75 MG PO TABS
75.0000 mg | ORAL_TABLET | Freq: Every day | ORAL | Status: DC
  Administered 2023-02-25 – 2023-03-12 (×16): 75 mg via ORAL
  Filled 2023-02-25 (×16): qty 1

## 2023-02-25 MED ORDER — ROSUVASTATIN CALCIUM 20 MG PO TABS
20.0000 mg | ORAL_TABLET | Freq: Every day | ORAL | Status: DC
  Administered 2023-02-25 – 2023-03-11 (×15): 20 mg via ORAL
  Filled 2023-02-25 (×16): qty 1

## 2023-02-25 MED ORDER — INSULIN ASPART 100 UNIT/ML IJ SOLN
0.0000 [IU] | Freq: Three times a day (TID) | INTRAMUSCULAR | Status: DC
  Administered 2023-02-25: 5 [IU] via SUBCUTANEOUS
  Filled 2023-02-25: qty 1

## 2023-02-25 MED ORDER — ACETAMINOPHEN 325 MG PO TABS: 650.0000 mg | ORAL_TABLET | Freq: Four times a day (QID) | ORAL | Status: DC | PRN

## 2023-02-25 MED ORDER — ACETAMINOPHEN 650 MG RE SUPP: 650.0000 mg | Freq: Four times a day (QID) | RECTAL | Status: DC | PRN

## 2023-02-25 MED ORDER — LIDOCAINE HCL (PF) 1 % IJ SOLN
INTRAMUSCULAR | Status: AC
  Filled 2023-02-25: qty 5

## 2023-02-25 MED ORDER — VITAMIN D (ERGOCALCIFEROL) 1.25 MG (50000 UNIT) PO CAPS
50000.0000 [IU] | ORAL_CAPSULE | ORAL | Status: DC
  Administered 2023-02-25 – 2023-03-11 (×3): 50000 [IU] via ORAL
  Filled 2023-02-25 (×3): qty 1

## 2023-02-25 MED ORDER — ENOXAPARIN SODIUM 30 MG/0.3ML IJ SOSY
30.0000 mg | PREFILLED_SYRINGE | INTRAMUSCULAR | Status: DC
  Administered 2023-02-25 – 2023-03-12 (×16): 30 mg via SUBCUTANEOUS
  Filled 2023-02-25 (×16): qty 0.3

## 2023-02-25 MED ORDER — STROKE: EARLY STAGES OF RECOVERY BOOK: Freq: Once | Status: AC

## 2023-02-25 MED ORDER — MAGNESIUM HYDROXIDE 400 MG/5ML PO SUSP: 30.0000 mL | Freq: Every day | ORAL | Status: DC | PRN

## 2023-02-25 MED ORDER — POLYETHYLENE GLYCOL 3350 17 G PO PACK
17.0000 g | PACK | Freq: Every day | ORAL | Status: DC
  Administered 2023-02-25 – 2023-03-12 (×14): 17 g via ORAL
  Filled 2023-02-25 (×14): qty 1

## 2023-02-25 MED ORDER — AMLODIPINE BESYLATE 10 MG PO TABS
10.0000 mg | ORAL_TABLET | Freq: Every day | ORAL | Status: DC
  Administered 2023-02-25 – 2023-03-12 (×16): 10 mg via ORAL
  Filled 2023-02-25 (×2): qty 1
  Filled 2023-02-25: qty 2
  Filled 2023-02-25 (×13): qty 1

## 2023-02-25 MED ORDER — DEXTROSE 50 % IV SOLN: 1.0000 | Freq: Once | INTRAVENOUS | Status: AC

## 2023-02-25 MED ORDER — TRAZODONE HCL 50 MG PO TABS: 25.0000 mg | ORAL_TABLET | Freq: Every evening | ORAL | Status: DC | PRN

## 2023-02-25 MED ORDER — DEXTROSE 50 % IV SOLN
INTRAVENOUS | Status: AC
  Administered 2023-02-25: 50 mL via INTRAVENOUS
  Filled 2023-02-25: qty 50

## 2023-02-25 MED ORDER — POTASSIUM CHLORIDE IN NACL 20-0.9 MEQ/L-% IV SOLN
INTRAVENOUS | Status: DC
  Filled 2023-02-25 (×4): qty 1000

## 2023-02-25 MED ORDER — INSULIN GLARGINE-YFGN 100 UNIT/ML ~~LOC~~ SOLN
10.0000 [IU] | Freq: Every day | SUBCUTANEOUS | Status: DC
  Administered 2023-02-26 – 2023-02-27 (×2): 10 [IU] via SUBCUTANEOUS
  Filled 2023-02-25 (×4): qty 0.1

## 2023-02-25 MED ORDER — SENNOSIDES-DOCUSATE SODIUM 8.6-50 MG PO TABS
2.0000 | ORAL_TABLET | Freq: Every day | ORAL | Status: DC
  Administered 2023-02-25 – 2023-03-11 (×14): 2 via ORAL
  Filled 2023-02-25 (×16): qty 2

## 2023-02-25 MED ORDER — INSULIN ASPART 100 UNIT/ML IJ SOLN
0.0000 [IU] | Freq: Every day | INTRAMUSCULAR | Status: DC
  Administered 2023-02-25: 3 [IU] via SUBCUTANEOUS
  Filled 2023-02-25: qty 1

## 2023-02-25 MED ORDER — B COMPLEX-C PO TABS
1.0000 | ORAL_TABLET | Freq: Every day | ORAL | Status: DC
  Administered 2023-02-25 – 2023-03-12 (×16): 1 via ORAL
  Filled 2023-02-25 (×16): qty 1

## 2023-02-25 MED ORDER — SODIUM CHLORIDE 0.9 % IV BOLUS
1000.0000 mL | Freq: Once | INTRAVENOUS | Status: AC
  Administered 2023-02-25: 1000 mL via INTRAVENOUS

## 2023-02-25 MED ORDER — LEVETIRACETAM 250 MG PO TABS
250.0000 mg | ORAL_TABLET | Freq: Two times a day (BID) | ORAL | Status: DC
  Administered 2023-02-25 – 2023-03-12 (×31): 250 mg via ORAL
  Filled 2023-02-25 (×33): qty 1

## 2023-02-25 MED ORDER — IOHEXOL 350 MG/ML SOLN
100.0000 mL | Freq: Once | INTRAVENOUS | Status: AC | PRN
  Administered 2023-02-25: 100 mL via INTRAVENOUS

## 2023-02-25 MED ORDER — METOPROLOL SUCCINATE ER 25 MG PO TB24
25.0000 mg | ORAL_TABLET | Freq: Every day | ORAL | Status: DC
  Administered 2023-02-25 – 2023-03-06 (×10): 25 mg via ORAL
  Filled 2023-02-25 (×10): qty 1

## 2023-02-25 MED ORDER — ONDANSETRON HCL 4 MG/2ML IJ SOLN: 4.0000 mg | Freq: Once | INTRAMUSCULAR | Status: AC

## 2023-02-25 NOTE — Assessment & Plan Note (Addendum)
-   This is manifested by aphasia and left-sided hemiparesis.  Differential diagnosis would include TIA. - The patient will be admitted to an observation medically monitored bed.   - We will follow neuro checks q.4 hours for 24 hours.   - The patient will be placed on aspirin.   - Will obtain a brain MRI without contrast and 2D echo with bubble study .   - A neurology consultation  as well as physical/occupation/speech therapy consults will be obtained in a.m.Marland Kitchen - I notified Dr. Otelia Limes about the patient. - The patient will be placed on statin therapy and fasting lipids will be checked.

## 2023-02-25 NOTE — Consult Note (Addendum)
NEURO HOSPITALIST CONSULT NOTE   Requesting physician: Dr. Georgeann Oppenheim  Reason for Consult: Expressive aphasia and left hemiparesis  History obtained from:  Chart     HPI:                                                                                                                                          [name] Gilbert is an 62 y.o. 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 Abdominal imaging revealed a relatively large volume of stool throughout the colon and rectum. She was covered with ABX empirically for possible UTI and also prescribed Miralax with strict rectum precautions and instructions to follow up with her PCP within 24-48 hours.   She returned to the ED via EMS early Sunday AM as a Code Stroke after onset at home of right sided gaze deviation, new onset of left sided weakness and patient not following any commands. LKN 6/15 at 2000. CBG on arrival was 64 in the context of her CBGs usually being in the upper 200's. An amp of D50 was given. BP on presentation was 146/112. Teleneurology was consulted. NIHSS was 27. CT head revealed remote left PCA territory infarction with no acute intracranial abnormalities. It was felt that her hypoglycemia did not entirely explain her presentation. The Teleneurology consultant noted that in Feb of this year MRI brain showed a focus of hemorrhage in the central pons. She has known multifocal regions of high grade stenosis in the left MCA and left ACA. She has an occluded PCA as cause of a prior CVA. She was not a candidate for IV TNK due to history  of ICH. She was not a candidate for IR due to mRS of 4.    She was admitted under the Hospitalist service for further management. Hospitalist clarified with family that the patient has underlying residual right upper and lower extremity paresis due to previous stroke and that she cannot ambulate at her baseline. Her husband believes that the left side was weak before as well. She is on Plavix and rosuvastatin at home.    Past Medical History:  Diagnosis Date   Diabetes mellitus without complication (HCC)    Hypertension    Stroke (HCC)    visual impaired     Past Surgical History:  Procedure Laterality Date   ABDOMINAL HYSTERECTOMY     TONSILLECTOMY  Family History  Problem Relation Age of Onset   Diabetes Father              Social History:  reports that she has never smoked. She has never been exposed to tobacco smoke. She has never used smokeless tobacco. She reports that she does not drink alcohol and does not use drugs.  Allergies  Allergen Reactions   Metformin Other (See Comments)    Reaction: unknown   Penicillins Hives, Nausea And Vomiting and Swelling    Has patient had a PCN reaction causing immediate rash, facial/tongue/throat swelling, SOB or lightheadedness with hypotension: Yes Has patient had a PCN reaction causing severe rash involving mucus membranes or skin necrosis: No Has patient had a PCN reaction that required hospitalization Yes Has patient had a PCN reaction occurring within the last 10 years: No If all of the above answers are "NO", then may proceed with Cephalosporin use.    Codeine Other (See Comments)    Reaction: HIVES   Hydralazine     Stomach pain   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    MEDICATIONS:                                                                                                                     No current facility-administered medications on file prior to encounter.   Current Outpatient  Medications on File Prior to Encounter  Medication Sig Dispense Refill   acetaminophen (TYLENOL) 325 MG tablet Take 2 tablets (650 mg total) by mouth every 4 (four) hours as needed for mild pain (or temp > 37.5 C (99.5 F)).     amLODipine (NORVASC) 10 MG tablet Take 10 mg by mouth daily.     clopidogrel (PLAVIX) 75 MG tablet Take 1 tablet (75 mg total) by mouth daily. 30 tablet 11   Insulin Aspart FlexPen (NOVOLOG) 100 UNIT/ML Inject 5 Units into the skin with breakfast, with lunch, and with evening meal. 15 mL 0   metoprolol succinate (TOPROL-XL) 25 MG 24 hr tablet Take 1 tablet (25 mg total) by mouth daily. 30 tablet 0   polyethylene glycol (MIRALAX / GLYCOLAX) 17 g packet Take 17 g by mouth daily.     rosuvastatin (CRESTOR) 40 MG tablet Take 0.5 tablets (20 mg total) by mouth at bedtime. 30 tablet 0   senna-docusate (SENOKOT-S) 8.6-50 MG tablet Take 2 tablets by mouth at bedtime.     sulfamethoxazole-trimethoprim (BACTRIM DS) 800-160 MG tablet Take 1 tablet by mouth 2 (two) times daily for 5 days. 10 tablet 0   TRESIBA FLEXTOUCH 100 UNIT/ML FlexTouch Pen Inject 10 Units into the skin at bedtime. 15 mL 0   Vitamin D, Ergocalciferol, (DRISDOL) 1.25 MG (50000 UNIT) CAPS capsule Take 50,000 Units by mouth every 7 (seven) days.     B Complex-C (B-COMPLEX WITH VITAMIN C) tablet Take 1 tablet by mouth daily. (Patient not taking: Reported on 10/16/2022) 30 tablet 0   levETIRAcetam (KEPPRA) 250  MG tablet Take 250 mg by mouth 2 (two) times daily.      Scheduled:  [START ON 02/26/2023]  stroke: early stages of recovery book   Does not apply Once   amLODipine  10 mg Oral Daily   B-complex with vitamin C  1 tablet Oral Daily   clopidogrel  75 mg Oral Daily   enoxaparin (LOVENOX) injection  30 mg Subcutaneous Q24H   insulin aspart  0-15 Units Subcutaneous TID WC   insulin aspart  0-5 Units Subcutaneous QHS   insulin glargine-yfgn  10 Units Subcutaneous QHS   levETIRAcetam  250 mg Oral BID   metoprolol  succinate  25 mg Oral Daily   polyethylene glycol  17 g Oral Daily   rosuvastatin  20 mg Oral QHS   senna-docusate  2 tablet Oral QHS   Vitamin D (Ergocalciferol)  50,000 Units Oral Q7 days   Continuous:  0.9 % NaCl with KCl 20 mEq / L 100 mL/hr at 02/25/23 0536     ROS:                                                                                                                                       Unable to obtain due to aphasia.    Blood pressure (!) 144/73, pulse 89, temperature 98.7 F (37.1 C), temperature source Oral, resp. rate 15, SpO2 100 %.   General Examination:                                                                                                       Physical Exam HEENT- [place]/AT   Lungs- Respirations unlabored Extremities- Warm and well-perfused  Neurological Examination Mental Status: Awake and alert. Bradyphrenic with sparse speech. Increased latencies of verbal and motor responses. Frequent verbal perseveration. Poorly oriented. No dysarthria. Able to follow basic commands and answer basic questions. Left sided neglect.  Cranial Nerves: II: Difficult to assess visual fields due to cognitive impairment. Only sees two fingers when five are held in front of her at the center of her field of view. PERRL. III,IV, VI: Right sided gaze preference. Has difficulty crossing the midline to the left.  V: Temp sensation decreased on the right.  VII: Smile symmetric VIII: Hearing intact to voice IX,X: No hypophonia or hoarseness XI: Head preferentially rotated to the right.  XII: Midline tongue extension Motor: RUE: Flexion contractures to elbow, wrist and digits. Arm tonically adducted. Severely increased flexor tone. Able to move hand and forearm  with 3/5 strength and decreased ROM LUE: 3-4/5 proximally and distally with increased tone.  RLE: 1-2/5 hip flexion. Severely increased extensor tone at knee but unable to resist examiner. Minimal movement of right  foot/toes.  LLE: 2-3/5 hip flexion and knee extension. Slight movement of foot. Moderately to severely increased tone. Sensory: Decreased sensation to RUE and RLE Deep Tendon Reflexes: Brisk low amplitude reflexes throughout. Toes equivocal bilaterally Cerebellar: Unable to assess Gait: Unable to assess    Lab Results: Basic Metabolic Panel: Recent Labs  Lab 02/24/23 0315 02/25/23 0145 02/25/23 0530  NA 134* 136 133*  K 3.4* 3.5 3.7  CL 101 102 103  CO2 22 19* 18*  GLUCOSE 212* 249* 250*  BUN 26* 22 22  CREATININE 1.63* 1.93* 1.76*  CALCIUM 8.6* 8.7* 8.2*    CBC: Recent Labs  Lab 02/24/23 0315 02/25/23 0145  WBC 6.3 7.6  NEUTROABS 4.3 5.4  HGB 11.4* 12.4  HCT 33.8* 36.3  MCV 86.9 90.1  PLT 270 210    Cardiac Enzymes: No results for input(s): "CKTOTAL", "CKMB", "CKMBINDEX", "TROPONINI" in the last 168 hours.  Lipid Panel: Recent Labs  Lab 02/25/23 0530  CHOL 241*  TRIG 60  HDL 88  CHOLHDL 2.7  VLDL 12  LDLCALC 409*    Imaging: MR BRAIN WO CONTRAST  Result Date: 02/25/2023 CLINICAL DATA:  62 year old female with code stroke presentation, rightward gaze. Chronic ACA and PCA atherosclerosis and stenosis on CTA. EXAM: MRI HEAD WITHOUT CONTRAST TECHNIQUE: Multiplanar, multiecho pulse sequences of the brain and surrounding structures were obtained without intravenous contrast. COMPARISON:  CTA head and neck 0230 hours today. Brain MRI 01/01/2023. FINDINGS: Brain: Ongoing right posterosuperior frontal lobe subcortical white matter abnormal diffusion, slightly extended from similar abnormality on 01/01/2023 MRI (series 9, image 33). And contralateral posterior left frontal lobe subcortical white matter infarct with restricted diffusion near the same level is new. No other convincing acutely abnormal diffusion. Chronic infarcts and encephalomalacia in the bilateral PCA territories, right thalamus and left deep gray nuclei, posterior right MCA territory at the parietal  lobe, to a lesser extent brainstem. Chronic microhemorrhage in the central pons. Additional brainstem Wallerian degeneration. Hemosiderin and/or laminar necrosis throughout much of the left PCA territory. Confluent additional cerebral white matter T2 and FLAIR hyperintensity. No midline shift, mass effect, evidence of mass lesion, acute ventriculomegaly, extra-axial collection or acute intracranial hemorrhage. Cervicomedullary junction and pituitary are within normal limits. Vascular: Major intracranial vascular flow voids are stable when allowing for motion degraded axial T2 imaging today. Skull and upper cervical spine: Motion degraded upper cervical spine on sagittal T1 today. Visualized bone marrow signal is within normal limits. Sinuses/Orbits: Ongoing rightward gaze. Paranasal Visualized paranasal sinuses and mastoids are stable and well aerated. Other: Negative visible scalp and face. IMPRESSION: 1. Small acute posterior left frontal lobe subcortical white matter infarct, near the left centrum semiovale. And contralateral posterior right frontal lobe white matter infarct which appears recurrent for extended from similar ischemia in April. No associated hemorrhage or mass effect. 2. Otherwise stable advanced chronic ischemic changes in the brain, disproportionately affecting the PCA territories. Electronically Signed   By: Odessa Fleming M.D.   On: 02/25/2023 06:06   CT ANGIO HEAD NECK W WO CM W PERF (CODE STROKE)  Result Date: 02/25/2023 CLINICAL DATA:  Right-sided gaze deviation EXAM: CT ANGIOGRAPHY HEAD AND NECK CT PERFUSION BRAIN TECHNIQUE: Multidetector CT imaging of the head and neck was performed using the standard protocol during bolus administration of intravenous  contrast. Multiplanar CT image reconstructions and MIPs were obtained to evaluate the vascular anatomy. Carotid stenosis measurements (when applicable) are obtained utilizing NASCET criteria, using the distal internal carotid diameter as the  denominator. Multiphase CT imaging of the brain was performed following IV bolus contrast injection. Subsequent parametric perfusion maps were calculated using RAPID software. RADIATION DOSE REDUCTION: This exam was performed according to the departmental dose-optimization program which includes automated exposure control, adjustment of the mA and/or kV according to patient size and/or use of iterative reconstruction technique. CONTRAST:  OMNIPAQUE IOHEXOL 350 MG/ML SOLN COMPARISON:  CT head 02/25/2023, correlation is also made with CTA head and neck 01/01/2023 FINDINGS: CT HEAD FINDINGS For noncontrast findings, please see same day CT head. CTA NECK FINDINGS Aortic arch: Standard branching. Imaged portion shows no evidence of aneurysm or dissection. No significant stenosis of the major arch vessel origins. Right carotid system: No evidence of dissection, occlusion, or hemodynamically significant stenosis (greater than 50%). Left carotid system: No evidence of dissection, occlusion, or hemodynamically significant stenosis (greater than 50%). Vertebral arteries: No evidence of dissection, occlusion, or hemodynamically significant stenosis (greater than 50%). Skeleton: No acute osseous abnormality. Degenerative changes in the cervical spine. Other neck: Hypoenhancing nodules in the thyroid, the largest of which measures up to 9 mm, for which no follow-up is currently indicated. (Reference: J Am Coll Radiol. 2015 Feb;12(2): 143-50) Upper chest: No focal pulmonary opacity or pleural effusion. Review of the MIP images confirms the above findings CTA HEAD FINDINGS Anterior circulation: Both internal carotid arteries are patent to the termini, with calcifications but without significant stenosis. Patent right A1. Aplastic left A1. Normal anterior communicating artery. Redemonstrated short segment occlusion of left A2 (series 7, image 108), with additional areas of focal moderate stenosis more distally (series 7,  image 96), unchanged. Anterior cerebral arteries are otherwise patent to their distal aspects without significant stenosis. Severe stenosis in the distal M1 segments (series 7, images 114 and 118). Additional multifocal severe stenosis throughout the bilateral MCA branches, similar to prior. Posterior circulation: Vertebral arteries patent to the vertebrobasilar junction without significant stenosis. Posterior inferior cerebellar arteries patent proximally. Basilar patent to its distal aspect without significant stenosis. Superior cerebellar arteries patent proximally. Redemonstrated occlusion of the left PCA. Redemonstrated multifocal moderate to severe stenosis in the right PCA branches. Venous sinuses: Not well opacified due to arterial timing. Anatomic variants: None significant. Review of the MIP images confirms the above findings CT Brain Perfusion Findings: ASPECTS: 10 CBF (<30%) Volume: 9mL Perfusion (Tmax>6.0s) volume: 20mL Mismatch Volume: 11mL Infarction Location:The infarct core is in the left parietal lobe, with the penumbra in the left parietal and left frontal lobe. Some of this may correlate with encephalomalacia in the left PCA territory. The left frontal lobe penumbra may be artifactual, given motion. IMPRESSION: 1. No new intracranial large vessel occlusion. Redemonstrated short segment occlusion of the left A2, with additional areas of focal moderate stenosis more distally in the ACAs. 2. Redemonstrated occlusion of the left PCA with multifocal moderate to severe stenosis in the right PCA. 3. Multifocal severe stenosis throughout the bilateral MCA branches. 4. No hemodynamically significant stenosis in the neck. 5. Perfusion imaging demonstrates an infarct core in the left parietal lobe, with the number in the left parietal and left frontal lobe. The left parietal findings likely correlate with the chronic encephalomalacia in the left PCA territory. In the inferior left frontal lobe penumbra may  be artifactual, secondary to motion. Electronically Signed   By: Jill Side  Vasan M.D.   On: 02/25/2023 03:07   DG Chest Portable 1 View  Result Date: 02/25/2023 CLINICAL DATA:  Status post central line placement EXAM: PORTABLE CHEST 1 VIEW COMPARISON:  01/01/2019 FINDINGS: Cardiac shadow is within normal limits. Lungs are well aerated bilaterally. Right jugular central line is noted with the tip at the inferior cavoatrial junction. This should be withdrawn several cm. No pneumothorax is seen. No bony abnormality is noted. IMPRESSION: Status post central line placement without evidence of pneumothorax. Catheter should be withdrawn several cm. Electronically Signed   By: Alcide Clever M.D.   On: 02/25/2023 02:27   CT HEAD CODE STROKE WO CONTRAST  Result Date: 02/25/2023 CLINICAL DATA:  Code stroke.  Stroke suspected, nonverbal EXAM: CT HEAD WITHOUT CONTRAST TECHNIQUE: Contiguous axial images were obtained from the base of the skull through the vertex without intravenous contrast. RADIATION DOSE REDUCTION: This exam was performed according to the departmental dose-optimization program which includes automated exposure control, adjustment of the mA and/or kV according to patient size and/or use of iterative reconstruction technique. COMPARISON:  01/21/2023 FINDINGS: Brain: No evidence of acute infarction, hemorrhage, mass, mass effect, or midline shift. No hydrocephalus or extra-axial collection. Redemonstrated remote infarct involving the left PCA territory. Periventricular white matter changes, likely the sequela of chronic small vessel ischemic disease. Vascular: No hyperdense vessel. Atherosclerotic calcifications in the intracranial carotid and vertebral arteries. Skull: Negative for fracture or focal lesion. Sinuses/Orbits: Small right maxillary mucous retention cyst. Otherwise clear paranasal sinuses. No acute finding in the orbits. Other: The mastoid air cells are well aerated. ASPECTS [facility] Stroke  Program Early CT Score) - Ganglionic level infarction (caudate, lentiform nuclei, internal capsule, insula, M1-M3 cortex): 7 - Supraganglionic infarction (M4-M6 cortex): 3 Total score (0-10 with 10 being normal): 10 IMPRESSION: 1. No acute intracranial process. ASPECTS is 10. 2. Remote left PCA territory infarct. Code stroke imaging results were communicated on 02/25/2023 at 12:48 am to provider [facility] via telephone, who verbally acknowledged these results. Electronically Signed   By: Wiliam Ke M.D.   On: 02/25/2023 00:49   DG Abdomen 1 View  Result Date: 02/24/2023 CLINICAL DATA:  62 year old female with history of constipation. EXAM: ABDOMEN - 1 VIEW COMPARISON:  Abdominal radiograph 11/21/2022. FINDINGS: Gas and stool are seen scattered throughout the colon extending to the level of the distal rectum. No pathologic distension of small bowel is noted. Large volume of stool in the colon and rectum. No gross evidence of pneumoperitoneum. IMPRESSION: 1. Nonobstructive bowel gas pattern. 2. No pneumoperitoneum. 3. Relatively large volume of stool throughout the colon and rectum compatible with reported clinical history of constipation. Electronically Signed   By: Trudie Reed M.D.   On: 02/24/2023 06:15    Carotid ultrasound (10/16/22):  Right Carotid: The extracranial vessels were near-normal with only minimal  wall thickening or plaque.  Left Carotid: The extracranial vessels were near-normal with only minimal  wall thickening or plaque.  Vertebrals:  Bilateral vertebral arteries demonstrate antegrade flow.  Subclavians: Normal flow hemodynamics were seen in bilateral subclavian arteries.   TTE (10/15/22): 1. Left ventricular ejection fraction, by estimation, is 60 to 65%. The  left ventricle has normal function. The left ventricle has no regional  wall motion abnormalities. Left ventricular diastolic parameters are  consistent with Grade I diastolic dysfunction (impaired relaxation).   2.  Right ventricular systolic function is normal. The right ventricular  size is not well visualized. Tricuspid regurgitation signal is inadequate  for assessing PA pressure.  3. No evidence of mitral valve regurgitation.   4. The aortic valve is tricuspid. Aortic valve regurgitation is not  visualized.   5. The inferior vena cava is normal in size with greater than 50%  respiratory variability, suggesting right atrial pressure of 3 mmHg.   Assessment: 62 y.o. 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 LKN 6/15 at 2000. CBG on arrival was 64 in the context of her CBGs usually being in the upper 200's. An amp of D50 was given. BP on presentation was 146/112. Teleneurology was consulted. NIHSS was 27. CT head revealed remote left PCA territory infarction with no acute intracranial abnormalities. It was felt that her hypoglycemia did not entirely explain her presentation. Teleneurology consultant noted that in Feb of this year MRI brain showed a focus of hemorrhage in the central pons. She has known multifocal regions of high grade stenosis in the left MCA and left ACA. She has an occluded PCA as cause of a prior CVA. She was not a candidate for IV TNK due to history of ICH. She was not a candidate for IR due to mRS of 4.   - Exam reveals improvement relative to documented Teleneurology exam from early this morning. She is awake and alert with right greater than left limb weakness in her upper and lower extremities combined spasticity and rigidity. Speech is sparse but fluent. She is partially oriented and perseverates frequently.  - CT head: No acute intracranial  process. ASPECTS is 10. Remote large left PCA territory infarct. - MRI brain: Small acute posterior left frontal lobe subcortical white matter infarct, near the left centrum semiovale. Contralateral posterior right frontal lobe white matter infarct which appears recurrent or extended from similar ischemia in April. No associated hemorrhage or mass effect. Otherwise stable advanced chronic ischemic changes in the brain, disproportionately affecting the PCA territories, with large chronic left occipitotemporal ischemic infarct noted.  - EKG showed sinus rhythm with a rate of 88 with right axis deviation and prolonged QT interval with QTc 518 MS.  - CTA of head and neck: No new intracranial large vessel occlusion. Redemonstrated short segment occlusion of the left A2, with additional areas of focal moderate stenosis more distally in the ACAs. Redemonstrated occlusion of the left PCA with multifocal moderate to severe stenosis in the right PCA. Multifocal severe stenosis throughout the bilateral MCA branches. No hemodynamically significant stenosis in the neck.  - CTP: Perfusion imaging demonstrates an infarct core in the left parietal lobe, with the number in the left parietal and left frontal lobe. The left parietal findings likely correlate with the chronic encephalomalacia in the left PCA territory. Inferior left frontal lobe penumbra may be artifactual, secondary to motion. - Elevated cholesterol on labs. Low eGFR.  - DDx for mechanism of her new DWI abnormalities: Possible watershed strokes are a consideration due to possible low BP at home given her severe intracranial atherosclerotic disease. Severe hypoglycemia is a consideration due to the striking symmetry of the DWI findings.   Recommendations: - Continue Keppra - Has had a recent TTE in February. No indication for repeating at this time.  - Cardiac telemetry -  HgbA1c, fasting lipid panel - PT consult, OT consult, Speech consult - Continue  rosuvastatin - Add ASA to Plavix, as she has failed Plavix monotherapy - Glycemic control. Avoid hypoglycemia and hyperglycemia.  - Gentle hydration - Frequent neuro checks - NPO until passes stroke swallow screen - BP management with permissive HTN for the first 24 hours since symptom onset.     Electronically signed: Dr. Caryl Pina 02/25/2023, 8:33 AM

## 2023-02-25 NOTE — Progress Notes (Addendum)
0040 - Code Stroke Activated  LKWT 2000, mRS 4 Patient in CT and cart brought to CT 0047 - [facility] paged 0049 - Dr. Juliane Poot on stroke cart  [phone] - CT head results given to Dr. Juliane Poot on camera  [phone] - Team actively working on IV access 0122 - Dr. Juliane Poot off stroke cart, Angelene Giovanni RN on monitoing progress [phone] - Dr. Juliane Poot passed off case to Dr. Josem Kaufmann Gaspar Bidding 0217 - Beginning advanced imaging  0220 - Patient had episode of emesis 0227 -  Advanced imaging began

## 2023-02-25 NOTE — ED Triage Notes (Signed)
Pt to ED via [facility] from home with a code stroke, per EMS LVO score of 3, R sided gaze deviation, non-verbal (unsure if baseline or not), and patient not following commands. Per EMS [facility] 6/15 at 2000. Pt seen yesterday and sent home with tx for UTI. Of note on arrival CBG 64, normal CBG's in the upper 200's.

## 2023-02-25 NOTE — Progress Notes (Signed)
PHARMACIST - PHYSICIAN COMMUNICATION  CONCERNING:  Enoxaparin (Lovenox) for DVT Prophylaxis    RECOMMENDATION: Patient was prescribed enoxaprin 40mg  q24 hours for VTE prophylaxis.   There were no vitals filed for this visit.  There is no height or weight on file to calculate BMI.  Estimated Creatinine Clearance: 26.6 mL/min (A) (by C-G formula based on SCr of 1.93 mg/dL (H)).  Patient is candidate for enoxaparin 30mg  every 24 hours based on CrCl <70ml/min or Weight <45kg  DESCRIPTION: Pharmacy has adjusted enoxaparin dose per [facility] policy.  Patient is now receiving enoxaparin 30 mg every 24 hours   Otelia Sergeant, PharmD, [facility] 02/25/2023 4:46 AM

## 2023-02-25 NOTE — ED Notes (Signed)
X-ray at bedside to perform portable chest to confirm  placement of R internal jugular triple lumen central line. Stroke tele -neuro nurse Heather aware of progress.

## 2023-02-25 NOTE — Evaluation (Signed)
Occupational Therapy Evaluation Patient Details Name: [name] Gilbert MRN: [id number] DOB: 1961/01/18 Today's Date: 02/25/2023   History of Present Illness Pt is a 62 y/o F admitted on 02/25/23 after presenting with acut onset of expressive aphasia & R gaze preference with L sided weakness. Pt is being treated for acute CVA. PMH: DM2, CKD 3B, seizure disorder, HTN, CVA   Clinical Impression   Patient received for OT evaluation. See flowsheet below for details of function. Generally, patient requiring MAX A x2 for bed mobility, unable to perform functional mobility, and MAX A-Total A for ADLs. Patient will benefit from continued OT while in acute care.      Recommendations for follow up therapy are one component of a multi-disciplinary discharge planning process, led by the attending physician.  Recommendations may be updated based on patient status, additional functional criteria and insurance authorization.   Assistance Recommended at Discharge Frequent or constant Supervision/Assistance  Patient can return home with the following Two people to help with walking and/or transfers;Two people to help with bathing/dressing/bathroom;Assistance with cooking/housework;Assistance with feeding;Direct supervision/assist for medications management;Direct supervision/assist for financial management;Assist for transportation;Help with stairs or ramp for entrance    Functional Status Assessment  Patient has had a recent decline in their functional status and demonstrates the ability to make significant improvements in function in a reasonable and predictable amount of time.  Equipment Recommendations  Other (comment) (defer to next venue of care)    Recommendations for Other Services       Precautions / Restrictions Precautions Precautions: Fall Precaution Comments: hx R hemi Restrictions Weight Bearing Restrictions: No      Mobility Bed Mobility Overal bed mobility: Needs Assistance Bed  Mobility: Supine to Sit, Sit to Supine, Rolling Rolling: Max assist, Total assist, +2 for physical assistance   Supine to sit: Max assist, +2 for physical assistance Sit to supine: Max assist, HOB elevated        Transfers Overall transfer level: Needs assistance Equipment used: 2 person hand held assist Transfers: Sit to/from Stand Sit to Stand: Max assist, +2 physical assistance           General transfer comment: attempted STS from elevated edge of stretcher with PT blocking R knee, pt unable to clear buttocks      Balance Overall balance assessment: Needs assistance Sitting-balance support: Feet supported, Feet unsupported, Bilateral upper extremity supported Sitting balance-Leahy Scale: Poor   Postural control: Posterior lean                                 ADL either performed or assessed with clinical judgement   ADL Overall ADL's : Needs assistance/impaired Eating/Feeding: Moderate assistance Eating/Feeding Details (indicate cue type and reason): Pt agreeable to eating peaches (pieces, in small cup); Pt using LUE and lateral pinch grasp; OT built up handle slightly for increased ease of grasp. Pt starting with fork, but having difficulty spearing pieces, even with OT tilting cup towards pt. Switched to spoon with greater success, although still spilling intermittently, difficulty supinating wrist so that spoon was level, and continuing to require OT to tilt cup for successful scooping. Pt often taking next bite before previous bite was finished; coughed; OT removed food in order for pt to drink and swallow effectively prior to offering food again.  General ADL Comments: Besides eating, other ADLs not tested, but anticipate pt to require MAX-Total assist due to UE and LE physical deficits as well as cognitive deficits. Was unable to stand today, and required MAX A x2 to t/f to EOB.     Vision Baseline  Vision/History:  (unknown; no glasses in room today) Additional Comments: Pt with R head tilt, head rotating R when sitting up, and R gaze preference today. Unknown what is baseline and what is acute.     Perception     Praxis      Pertinent Vitals/Pain Pain Assessment Pain Assessment: Faces Faces Pain Scale: Hurts little more Pain Location: RLE with some movement Pain Descriptors / Indicators: Discomfort, Grimacing Pain Intervention(s): Limited activity within patient's tolerance, Monitored during session     Hand Dominance  (unsure. RUE has high tone at baseline from prior CVA per notes.)   Extremity/Trunk Assessment Upper Extremity Assessment Upper Extremity Assessment: RUE deficits/detail;LUE deficits/detail RUE Deficits / Details: RUE with elbow, wrist, and fingers flexed at rest; increased tone; however, pt able to use RUE when needing to such as picking up a cup with BIL hands; decreased shoulder flexion.  This appears to be at baseline per chart review, but unclear and pt unable to state. LUE Deficits / Details: L shoulder flexion limited to approx 30 degrees upon testing today. L hand with flexion of fingers 2-5; when pt holding silverware for eating, pt using lateral pinch grasp only. However, upon command pt able to extend fingers and grasp styrofoam cup to drink through straw, sometimes using just LUE and sometimes using BIL hands. Poor supination in LUE.   Lower Extremity Assessment Lower Extremity Assessment: Defer to PT evaluation   Cervical / Trunk Assessment Cervical / Trunk Assessment:  (head rotates to the R)   Communication Communication Communication: Expressive difficulties   Cognition Arousal/Alertness: Awake/alert Behavior During Therapy: Anxious, WFL for tasks assessed/performed Overall Cognitive Status: No family/caregiver present to determine baseline cognitive functioning                                 General Comments: Pt with some  word finding difficulties, impaired memory, decreased ability to follow simple commands throughout session.     General Comments  On room air today; saturation remaining stable in 90's.    Exercises     Shoulder Instructions      Home Living                                   Additional Comments: Per chart & pt, she arrived from home. Lives with husband. Is unable to provide any further information.      Prior Functioning/Environment Prior Level of Function : Needs assist             Mobility Comments: Pt is a poor historian, reports she was able to transfer bed<>w/c but unable to state how she does it. Does note she does not walk. ADLs Comments: Pt unable to state level of assist with ADLs at baseline beyond that her husband helps her with her socks.        OT Problem List: Decreased strength;Decreased range of motion;Decreased activity tolerance;Impaired balance (sitting and/or standing);Decreased safety awareness;Decreased cognition;Impaired UE functional use      OT Treatment/Interventions: Self-care/ADL training;Therapeutic exercise;Therapeutic activities;Patient/family education    OT Goals(Current goals can  be found in the care plan section) Acute Rehab OT Goals Patient Stated Goal: unable to state OT Goal Formulation: Patient unable to participate in goal setting Time For Goal Achievement: 03/11/23 Potential to Achieve Goals: Fair  OT Frequency: Min 1X/week    Co-evaluation PT/OT/SLP Co-Evaluation/Treatment: Yes Reason for Co-Treatment: For patient/therapist safety;Necessary to address cognition/behavior during functional activity;Complexity of the patient's impairments (multi-system involvement) PT goals addressed during session: Balance;Mobility/safety with mobility OT goals addressed during session: ADL's and self-care;Strengthening/ROM      AM-PAC OT "6 Clicks" Daily Activity     Outcome Measure Help from another person eating meals?: A  Lot Help from another person taking care of personal grooming?: Total Help from another person toileting, which includes using toliet, bedpan, or urinal?: Total Help from another person bathing (including washing, rinsing, drying)?: Total Help from another person to put on and taking off regular upper body clothing?: Total Help from another person to put on and taking off regular lower body clothing?: Total 6 Click Score: 7   End of Session    Activity Tolerance: Patient tolerated treatment well Patient left: in bed;with call bell/phone within reach (RN aware of pt status)  OT Visit Diagnosis: Muscle weakness (generalized) (M62.81);Hemiplegia and hemiparesis Hemiplegia - Right/Left: Right                Time: 6045-4098 OT Time Calculation (min): 36 min Charges:  OT General Charges $OT Visit: 1 Visit OT Evaluation $OT Eval Moderate Complexity: 1 Mod OT Treatments $Self Care/Home Management : 8-22 mins  Linward Foster, MS, OTR/L  Alvester Morin 02/25/2023, 10:28 AM

## 2023-02-25 NOTE — ED Provider Notes (Addendum)
[facility] Provider Note   Event Date/Time   First MD Initiated Contact with Patient 02/25/23 0600     (approximate) History  Code Stroke  HPI [name] Gilbert is a 62 y.o. 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  Patient is unable to give any further history at this time ROS: Unable to assess   Physical Exam  Triage Vital Signs: ED Triage Vitals [02/25/23 0209]  Enc Vitals Group     BP (!) 146/112     Pulse Rate 100     Resp 14     Temp      Temp src      SpO2 100 %     Weight      Height      Head Circumference      Peak Flow      Pain Score      Pain Loc      Pain Edu?      Excl. in GC?    Most recent vital signs: Vitals:   02/25/23 0530 02/25/23 0600  BP: (!) 139/103 (!) 147/97  Pulse: 91 90  Resp: 14 11  SpO2: 100% 100%   General: Awake CV:  Good peripheral perfusion.  Resp:  Normal effort.  Abd:  No distention.  Other:  Elderly overweight African-American female laying in bed nonverbal, contraction to the right upper extremity, no purposeful movement in left upper extremity ED Results / Procedures / Treatments  Labs (all labs ordered are listed, but only abnormal results are displayed) Labs Reviewed  COMPREHENSIVE METABOLIC PANEL - Abnormal; Notable for the following components:      Result Value   CO2 19 (*)    Glucose, Bld 249 (*)    Creatinine, Ser 1.93 (*)    Calcium 8.7 (*)    Total Protein 8.3 (*)    GFR, Estimated 29 (*)    All other components within normal limits  URINALYSIS, ROUTINE W REFLEX MICROSCOPIC - Abnormal; Notable for the following components:   Color, Urine YELLOW (*)    APPearance HAZY (*)    Specific Gravity, Urine 1.041 (*)    Glucose, UA >=500 (*)    Ketones, ur 5 (*)    Protein, ur 100 (*)    All other components  within normal limits  LIPID PANEL - Abnormal; Notable for the following components:   Cholesterol 241 (*)    LDL Cholesterol 141 (*)    All other components within normal limits  BASIC METABOLIC PANEL - Abnormal; Notable for the following components:   Sodium 133 (*)    CO2 18 (*)    Glucose, Bld 250 (*)    Creatinine, Ser 1.76 (*)    Calcium 8.2 (*)    GFR, Estimated 32 (*)    All other components within normal limits  CBG MONITORING, ED - Abnormal; Notable for the following components:   Glucose-Capillary 64 (*)    All other components within normal limits  CBG MONITORING, ED - Abnormal; Notable for the following components:   Glucose-Capillary 297 (*)    All other components within normal limits  CBG MONITORING, ED - Abnormal; Notable for the following components:   Glucose-Capillary 267 (*)    All other components within normal limits  CBG MONITORING, ED - Abnormal; Notable for the following components:  Glucose-Capillary 231 (*)    All other components within normal limits  ETHANOL  PROTIME-INR  APTT  CBC  DIFFERENTIAL  URINE DRUG SCREEN, QUALITATIVE ([facility] ONLY)  PROTIME-INR  APTT  CBC   EKG ED ECG REPORT I, Merwyn Katos, the attending physician, personally viewed and interpreted this ECG. Date: 02/25/2023 EKG Time: 0242 Rate: 88 Rhythm: normal sinus rhythm QRS Axis: normal Intervals: normal ST/T Wave abnormalities: normal Narrative Interpretation: no evidence of acute ischemia RADIOLOGY ED MD interpretation: CT of the head without contrast interpreted by me shows no evidence of acute abnormalities including no intracerebral hemorrhage, obvious masses, or significant edema  1 view portable chest x-ray interpreted by me shows no evidence of acute abnormalities including no pneumonia, pneumothorax, or widened mediastinum.  There is a right IJ central line that will need to be retracted -Agree with radiology assessment Official radiology report(s): MR BRAIN WO  CONTRAST  Result Date: 02/25/2023 CLINICAL DATA:  62 year old female with code stroke presentation, rightward gaze. Chronic ACA and PCA atherosclerosis and stenosis on CTA. EXAM: MRI HEAD WITHOUT CONTRAST TECHNIQUE: Multiplanar, multiecho pulse sequences of the brain and surrounding structures were obtained without intravenous contrast. COMPARISON:  CTA head and neck 0230 hours today. Brain MRI 01/01/2023. FINDINGS: Brain: Ongoing right posterosuperior frontal lobe subcortical white matter abnormal diffusion, slightly extended from similar abnormality on 01/01/2023 MRI (series 9, image 33). And contralateral posterior left frontal lobe subcortical white matter infarct with restricted diffusion near the same level is new. No other convincing acutely abnormal diffusion. Chronic infarcts and encephalomalacia in the bilateral PCA territories, right thalamus and left deep gray nuclei, posterior right MCA territory at the parietal lobe, to a lesser extent brainstem. Chronic microhemorrhage in the central pons. Additional brainstem Wallerian degeneration. Hemosiderin and/or laminar necrosis throughout much of the left PCA territory. Confluent additional cerebral white matter T2 and FLAIR hyperintensity. No midline shift, mass effect, evidence of mass lesion, acute ventriculomegaly, extra-axial collection or acute intracranial hemorrhage. Cervicomedullary junction and pituitary are within normal limits. Vascular: Major intracranial vascular flow voids are stable when allowing for motion degraded axial T2 imaging today. Skull and upper cervical spine: Motion degraded upper cervical spine on sagittal T1 today. Visualized bone marrow signal is within normal limits. Sinuses/Orbits: Ongoing rightward gaze. Paranasal Visualized paranasal sinuses and mastoids are stable and well aerated. Other: Negative visible scalp and face. IMPRESSION: 1. Small acute posterior left frontal lobe subcortical white matter infarct, near the left  centrum semiovale. And contralateral posterior right frontal lobe white matter infarct which appears recurrent for extended from similar ischemia in April. No associated hemorrhage or mass effect. 2. Otherwise stable advanced chronic ischemic changes in the brain, disproportionately affecting the PCA territories. Electronically Signed   By: Odessa Fleming M.D.   On: 02/25/2023 06:06   CT ANGIO HEAD NECK W WO CM W PERF (CODE STROKE)  Result Date: 02/25/2023 CLINICAL DATA:  Right-sided gaze deviation EXAM: CT ANGIOGRAPHY HEAD AND NECK CT PERFUSION BRAIN TECHNIQUE: Multidetector CT imaging of the head and neck was performed using the standard protocol during bolus administration of intravenous contrast. Multiplanar CT image reconstructions and MIPs were obtained to evaluate the vascular anatomy. Carotid stenosis measurements (when applicable) are obtained utilizing NASCET criteria, using the distal internal carotid diameter as the denominator. Multiphase CT imaging of the brain was performed following IV bolus contrast injection. Subsequent parametric perfusion maps were calculated using RAPID software. RADIATION DOSE REDUCTION: This exam was performed according to the departmental dose-optimization program which  includes automated exposure control, adjustment of the mA and/or kV according to patient size and/or use of iterative reconstruction technique. CONTRAST:  OMNIPAQUE IOHEXOL 350 MG/ML SOLN COMPARISON:  CT head 02/25/2023, correlation is also made with CTA head and neck 01/01/2023 FINDINGS: CT HEAD FINDINGS For noncontrast findings, please see same day CT head. CTA NECK FINDINGS Aortic arch: Standard branching. Imaged portion shows no evidence of aneurysm or dissection. No significant stenosis of the major arch vessel origins. Right carotid system: No evidence of dissection, occlusion, or hemodynamically significant stenosis (greater than 50%). Left carotid system: No evidence of dissection, occlusion, or  hemodynamically significant stenosis (greater than 50%). Vertebral arteries: No evidence of dissection, occlusion, or hemodynamically significant stenosis (greater than 50%). Skeleton: No acute osseous abnormality. Degenerative changes in the cervical spine. Other neck: Hypoenhancing nodules in the thyroid, the largest of which measures up to 9 mm, for which no follow-up is currently indicated. (Reference: J Am Coll Radiol. 2015 Feb;12(2): 143-50) Upper chest: No focal pulmonary opacity or pleural effusion. Review of the MIP images confirms the above findings CTA HEAD FINDINGS Anterior circulation: Both internal carotid arteries are patent to the termini, with calcifications but without significant stenosis. Patent right A1. Aplastic left A1. Normal anterior communicating artery. Redemonstrated short segment occlusion of left A2 (series 7, image 108), with additional areas of focal moderate stenosis more distally (series 7, image 96), unchanged. Anterior cerebral arteries are otherwise patent to their distal aspects without significant stenosis. Severe stenosis in the distal M1 segments (series 7, images 114 and 118). Additional multifocal severe stenosis throughout the bilateral MCA branches, similar to prior. Posterior circulation: Vertebral arteries patent to the vertebrobasilar junction without significant stenosis. Posterior inferior cerebellar arteries patent proximally. Basilar patent to its distal aspect without significant stenosis. Superior cerebellar arteries patent proximally. Redemonstrated occlusion of the left PCA. Redemonstrated multifocal moderate to severe stenosis in the right PCA branches. Venous sinuses: Not well opacified due to arterial timing. Anatomic variants: None significant. Review of the MIP images confirms the above findings CT Brain Perfusion Findings: ASPECTS: 10 CBF (<30%) Volume: 9mL Perfusion (Tmax>6.0s) volume: 20mL Mismatch Volume: 11mL Infarction Location:The infarct core is in  the left parietal lobe, with the penumbra in the left parietal and left frontal lobe. Some of this may correlate with encephalomalacia in the left PCA territory. The left frontal lobe penumbra may be artifactual, given motion. IMPRESSION: 1. No new intracranial large vessel occlusion. Redemonstrated short segment occlusion of the left A2, with additional areas of focal moderate stenosis more distally in the ACAs. 2. Redemonstrated occlusion of the left PCA with multifocal moderate to severe stenosis in the right PCA. 3. Multifocal severe stenosis throughout the bilateral MCA branches. 4. No hemodynamically significant stenosis in the neck. 5. Perfusion imaging demonstrates an infarct core in the left parietal lobe, with the number in the left parietal and left frontal lobe. The left parietal findings likely correlate with the chronic encephalomalacia in the left PCA territory. In the inferior left frontal lobe penumbra may be artifactual, secondary to motion. Electronically Signed   By: Wiliam Ke M.D.   On: 02/25/2023 03:07   DG Chest Portable 1 View  Result Date: 02/25/2023 CLINICAL DATA:  Status post central line placement EXAM: PORTABLE CHEST 1 VIEW COMPARISON:  01/01/2019 FINDINGS: Cardiac shadow is within normal limits. Lungs are well aerated bilaterally. Right jugular central line is noted with the tip at the inferior cavoatrial junction. This should be withdrawn several cm. No pneumothorax is  seen. No bony abnormality is noted. IMPRESSION: Status post central line placement without evidence of pneumothorax. Catheter should be withdrawn several cm. Electronically Signed   By: Alcide Clever M.D.   On: 02/25/2023 02:27   CT HEAD CODE STROKE WO CONTRAST  Result Date: 02/25/2023 CLINICAL DATA:  Code stroke.  Stroke suspected, nonverbal EXAM: CT HEAD WITHOUT CONTRAST TECHNIQUE: Contiguous axial images were obtained from the base of the skull through the vertex without intravenous contrast. RADIATION DOSE  REDUCTION: This exam was performed according to the departmental dose-optimization program which includes automated exposure control, adjustment of the mA and/or kV according to patient size and/or use of iterative reconstruction technique. COMPARISON:  01/21/2023 FINDINGS: Brain: No evidence of acute infarction, hemorrhage, mass, mass effect, or midline shift. No hydrocephalus or extra-axial collection. Redemonstrated remote infarct involving the left PCA territory. Periventricular white matter changes, likely the sequela of chronic small vessel ischemic disease. Vascular: No hyperdense vessel. Atherosclerotic calcifications in the intracranial carotid and vertebral arteries. Skull: Negative for fracture or focal lesion. Sinuses/Orbits: Small right maxillary mucous retention cyst. Otherwise clear paranasal sinuses. No acute finding in the orbits. Other: The mastoid air cells are well aerated. ASPECTS [facility] Stroke Program Early CT Score) - Ganglionic level infarction (caudate, lentiform nuclei, internal capsule, insula, M1-M3 cortex): 7 - Supraganglionic infarction (M4-M6 cortex): 3 Total score (0-10 with 10 being normal): 10 IMPRESSION: 1. No acute intracranial process. ASPECTS is 10. 2. Remote left PCA territory infarct. Code stroke imaging results were communicated on 02/25/2023 at 12:48 am to provider [facility] via telephone, who verbally acknowledged these results. Electronically Signed   By: Wiliam Ke M.D.   On: 02/25/2023 00:49   PROCEDURES: Critical Care performed: Yes, see critical care procedure note(s) .1-3 Lead EKG Interpretation  Performed by: Merwyn Katos, MD Authorized by: Merwyn Katos, MD     Interpretation: normal     ECG rate:  71   ECG rate assessment: normal     Rhythm: sinus rhythm     Ectopy: none     Conduction: normal    MEDICATIONS ORDERED IN ED: Medications  amLODipine (NORVASC) tablet 10 mg (has no administration in time range)  metoprolol succinate (TOPROL-XL)  24 hr tablet 25 mg (has no administration in time range)  rosuvastatin (CRESTOR) tablet 20 mg (has no administration in time range)  insulin glargine-yfgn (SEMGLEE) injection 10 Units (has no administration in time range)  polyethylene glycol (MIRALAX / GLYCOLAX) packet 17 g (has no administration in time range)  senna-docusate (Senokot-S) tablet 2 tablet (has no administration in time range)  clopidogrel (PLAVIX) tablet 75 mg (has no administration in time range)  levETIRAcetam (KEPPRA) tablet 250 mg (has no administration in time range)  B-complex with vitamin C tablet 1 tablet (has no administration in time range)  Vitamin D (Ergocalciferol) (DRISDOL) 1.25 MG (50000 UNIT) capsule 50,000 Units (has no administration in time range)   stroke: early stages of recovery book (has no administration in time range)  enoxaparin (LOVENOX) injection 30 mg (has no administration in time range)  acetaminophen (TYLENOL) tablet 650 mg (has no administration in time range)    Or  acetaminophen (TYLENOL) suppository 650 mg (has no administration in time range)  traZODone (DESYREL) tablet 25 mg (has no administration in time range)  ondansetron (ZOFRAN) tablet 4 mg (has no administration in time range)    Or  ondansetron (ZOFRAN) injection 4 mg (has no administration in time range)  magnesium hydroxide (MILK OF MAGNESIA) suspension  30 mL (has no administration in time range)  0.9 % NaCl with KCl 20 mEq/ L  infusion ( Intravenous New Bag/Given 02/25/23 0536)  insulin aspart (novoLOG) injection 0-5 Units (has no administration in time range)  insulin aspart (novoLOG) injection 0-15 Units (has no administration in time range)  dextrose 50 % solution 50 mL (50 mLs Intravenous Given 02/25/23 0139)  lidocaine (PF) (XYLOCAINE) 1 % injection (  Given 02/25/23 0308)  iohexol (OMNIPAQUE) 350 MG/ML injection 100 mL (100 mLs Intravenous Contrast Given 02/25/23 0233)  ondansetron (ZOFRAN) injection 4 mg (4 mg Intravenous  Given 02/25/23 0225)  sodium chloride 0.9 % bolus 1,000 mL (0 mLs Intravenous Stopped 02/25/23 0515)   IMPRESSION / MDM / ASSESSMENT AND PLAN / ED COURSE  I reviewed the triage vital signs and the nursing notes.                             The patient is on the cardiac monitor to evaluate for evidence of arrhythmia and/or significant heart rate changes. Patient's presentation is most consistent with acute presentation with potential threat to life or bodily function. Stroke alert PMH risk factors: Previous CVA, multifocal vascular disease, hypertension, hypercholesterolemia Neurologic Deficits: Aphasia, right-sided paralysis Last known Well Time: 2000 NIH Stroke Score: 27 Given History and Exam I have lower suspicion for infectious etiology, neurologic changes secondary to toxicologic ingestion, seizure, complex migraine. Presentation concerning for possible stroke requiring workup.  Workup: Labs: POC glucose, CBC, BMP, LFTs, Troponin, PT/INR, PTT, Type and Screen Other Diagnostics: ECG, CXR, non-contrast head CT followed by CTA brain and neck (to r/o large vessel occlusion amenable to thrombectomy) Interventions: Patient low on NIH scale and out of the window for tpa.  Consult: Neurology. Discussed with Dr. Juliane Poot regarding patients neurological symptoms and last well-known time and eligibility for TPA criteria. Disposition: Admission to medicine   FINAL CLINICAL IMPRESSION(S) / ED DIAGNOSES   Final diagnoses:  Aphasia  Muscle weakness of right upper extremity  Altered mental status, unspecified altered mental status type  Hypoglycemia   Rx / DC Orders   ED Discharge Orders     None      Note:  This document was prepared using Dragon voice recognition software and may include unintentional dictation errors.   Merwyn Katos, MD 02/25/23 0981    Merwyn Katos, MD 02/25/23 [phone]

## 2023-02-25 NOTE — Progress Notes (Signed)
Brief hospitalist update note.  This is a nonbillable note.  Please see same-day H&P for full billable details.  Briefly, this is a 62 year old African-American female history significant for type 2 diabetes mellitus, stage III chronic kidney disease, seizure disorder, hypertension, previous CVA who presents to the ED with acute onset of expressive aphasia and right gaze deviation and left-sided weakness.  MRI confirmed small acute infarct.  Neurology consulted by admitting physician.  Formal recommendations currently pending.  Patient admitted for full CVA workup.  Lolita Patella MD  No charge

## 2023-02-25 NOTE — Assessment & Plan Note (Signed)
-   We will continue Keppra. 

## 2023-02-25 NOTE — Assessment & Plan Note (Addendum)
And also hyperglycemia.  Last A1c 8.7 a couple months ago.  With sugars elevated this afternoon I will increase long-acting insulin to 8 units in the evening with sugars high this afternoon.  Continue sliding scale.

## 2023-02-25 NOTE — ED Notes (Signed)
Zofran given.

## 2023-02-25 NOTE — ED Notes (Signed)
Repeat CBG 267

## 2023-02-25 NOTE — Progress Notes (Addendum)
CSW acknowledges SNF recs from PT/OT. Noted PT has reached out to spouse unsuccessfully (patient unable to provide hx information).   CSW has also attempted to reach spouse and daughter to discuss SNF recommendations, unsuccessful. Will continue to attempt.   Darolyn Rua, [place], MSW, [place] [phone]

## 2023-02-25 NOTE — H&P (Addendum)
[place]   PATIENT NAME: [name] Gilbert    MR#:  [id number]  DATE OF BIRTH:  Feb 23, 1961  DATE OF ADMISSION:  02/25/2023  PRIMARY CARE PHYSICIAN: Tobey Grim, MD   Patient is coming from: Home  REQUESTING/REFERRING PHYSICIAN: Donna Bernard, MD  CHIEF COMPLAINT:   Chief Complaint  Patient presents with   Code Stroke    HISTORY OF PRESENT ILLNESS:  [name] Gilbert is a 62 y.o. African-American female with medical history significant for type 2 diabetes mellitus, with stage IIIb chronic kidney disease, seizure disorder, hypertension and CVA, who presented to emergency room with acute onset of expressive aphasia with right gaze preference and left-sided weakness that started around 11:20 PM.  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  Her husband believes that the left side was weak before as well.  No headache or dizziness or blurred vision.  No chest pain or palpitations.  No nausea or vomiting or abdominal pain.  No fever or chills.  No new paresthesias.  She has a right facial droop.  No dysuria, oliguria or hematuria or flank pain.  No weakness seizures.  No tinnitus or vertigo.  The patient was seen yesterday for abdominal pain that was managed in the ER.  She was found to be constipated and was started to have lower urinary tract infection.  She was given p.o. Bactrim DS.  ED Course: When she came to the ER, BP was 146/112 with otherwise normal vital signs.  Later on BP was 135/74.  Labs revealed borderline potassium of 3.5, CO2 19, blood glucose 249, a creatinine 1.63 better than yesterday and calcium 8.7 and CBC was within normal.  Coag profile was within normal.  Echo levels less than 10.  Urinalysis today shows digit 0-5 WBCs like yesterday and rare bacteria compared to many yesterday.  EKG as reviewed by me : EKG showed sinus rhythm with a rate of 88 with right axis  deviation and prolonged QT interval with QTc 518 MS. Imaging: Portable chest x-ray showed central line placement without evidence of pneumothorax.  Noncontrasted head CT scan revealed remote left PCA territory infarction with no acute intracranial abnormalities.  CTA of the head and neck with and without contrast revealed the following: 1. No new intracranial large vessel occlusion. Redemonstrated short segment occlusion of the left A2, with additional areas of focal moderate stenosis more distally in the ACAs. 2. Redemonstrated occlusion of the left PCA with multifocal moderate to severe stenosis in the right PCA. 3. Multifocal severe stenosis throughout the bilateral MCA branches. 4. No hemodynamically significant stenosis in the neck. 5. Perfusion imaging demonstrates an infarct core in the left parietal lobe, with the number in the left parietal and left frontal lobe. The left parietal findings likely correlate with the chronic encephalomalacia in the left PCA territory. In the inferior left frontal lobe penumbra may be artifactual, secondary to motion.  The patient was given an amp of D50, 4 mg of IV Zofran and 1 L bolus of IV normal saline.  She will be admitted to an observation medical telemetry bed for further evaluation and management.  PAST MEDICAL HISTORY:   Past Medical History:  Diagnosis Date   Diabetes mellitus without complication (HCC)    Hypertension    Stroke (HCC)    visual impaired     PAST SURGICAL HISTORY:   Past Surgical History:  Procedure Laterality  Date   ABDOMINAL HYSTERECTOMY     TONSILLECTOMY      SOCIAL HISTORY:   Social History   Tobacco Use   Smoking status: Never    Passive exposure: Never   Smokeless tobacco: Never  Substance Use Topics   Alcohol use: No    FAMILY HISTORY:   Family History  Problem Relation Age of Onset   Diabetes Father     DRUG ALLERGIES:   Allergies  Allergen Reactions   Metformin Other (See Comments)     Reaction: unknown   Penicillins Hives, Nausea And Vomiting and Swelling    Has patient had a PCN reaction causing immediate rash, facial/tongue/throat swelling, SOB or lightheadedness with hypotension: Yes Has patient had a PCN reaction causing severe rash involving mucus membranes or skin necrosis: No Has patient had a PCN reaction that required hospitalization Yes Has patient had a PCN reaction occurring within the last 10 years: No If all of the above answers are "NO", then may proceed with Cephalosporin use.    Codeine Other (See Comments)    Reaction: HIVES   Hydralazine     Stomach pain   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    REVIEW OF SYSTEMS:   ROS As per history of present illness. All pertinent systems were reviewed above. Constitutional, HEENT, cardiovascular, respiratory, GI, GU, musculoskeletal, neuro, psychiatric, endocrine, integumentary and hematologic systems were reviewed and are otherwise negative/unremarkable except for positive findings mentioned above in the HPI.   MEDICATIONS AT HOME:   Prior to Admission medications   Medication Sig Start Date End Date Taking? Authorizing Provider  acetaminophen (TYLENOL) 325 MG tablet Take 2 tablets (650 mg total) by mouth every 4 (four) hours as needed for mild pain (or temp > 37.5 C (99.5 F)). 10/25/22  Yes Osvaldo Shipper, MD  amLODipine (NORVASC) 10 MG tablet Take 10 mg by mouth daily. 10/03/22  Yes [provider]  clopidogrel (PLAVIX) 75 MG tablet Take 1 tablet (75 mg total) by mouth daily. 01/25/23 01/25/24 Yes Dorcas Carrow, MD  Insulin Aspart FlexPen (NOVOLOG) 100 UNIT/ML Inject 5 Units into the skin with breakfast, with lunch, and with evening meal. 01/25/23  Yes Dorcas Carrow, MD  metoprolol succinate (TOPROL-XL) 25 MG 24 hr tablet Take 1 tablet (25 mg total) by mouth daily. 01/25/23 02/25/23 Yes Ghimire, Lyndel Safe, MD  polyethylene glycol (MIRALAX / GLYCOLAX) 17 g packet Take 17 g by mouth  daily. 11/30/22  Yes [provider]  rosuvastatin (CRESTOR) 40 MG tablet Take 0.5 tablets (20 mg total) by mouth at bedtime. 01/25/23 02/25/23 Yes Ghimire, Lyndel Safe, MD  senna-docusate (SENOKOT-S) 8.6-50 MG tablet Take 2 tablets by mouth at bedtime. 11/30/22 11/30/23 Yes [provider]  sulfamethoxazole-trimethoprim (BACTRIM DS) 800-160 MG tablet Take 1 tablet by mouth 2 (two) times daily for 5 days. 02/24/23 03/01/23 Yes Bradler, Clent Jacks, MD  TRESIBA FLEXTOUCH 100 UNIT/ML FlexTouch Pen Inject 10 Units into the skin at bedtime. 01/25/23  Yes Ghimire, Lyndel Safe, MD  Vitamin D, Ergocalciferol, (DRISDOL) 1.25 MG (50000 UNIT) CAPS capsule Take 50,000 Units by mouth every 7 (seven) days. 02/02/23  Yes [provider]  B Complex-C (B-COMPLEX WITH VITAMIN C) tablet Take 1 tablet by mouth daily. Patient not taking: Reported on 10/16/2022 02/17/22   Angiulli, Mcarthur Rossetti, PA-C  levETIRAcetam (KEPPRA) 250 MG tablet Take 250 mg by mouth 2 (two) times daily. 10/03/22   [provider]      VITAL SIGNS:  Blood  pressure (!) 147/91, pulse 90, resp. rate 14, SpO2 100 %.  PHYSICAL EXAMINATION:  Physical Exam  GENERAL:  62 y.o.-year-old African-American female patient lying in the bed with no acute distress.  EYES: Pupils equal, round, reactive to light and accommodation. No scleral icterus. Extraocular muscles intact.  HEENT: Head atraumatic, normocephalic. Oropharynx and nasopharynx clear.  NECK:  Supple, no jugular venous distention. No thyroid enlargement, no tenderness.  LUNGS: Normal breath sounds bilaterally, no wheezing, rales,rhonchi or crepitation. No use of accessory muscles of respiration.  CARDIOVASCULAR: Regular rate and rhythm, S1, S2 normal. No murmurs, rubs, or gallops.  ABDOMEN: Soft, nondistended, nontender. Bowel sounds present. No organomegaly or mass.  EXTREMITIES: No pedal edema, cyanosis, or clubbing.  NEUROLOGIC: Cranial nerves II through XII are intact except for right  gaze preference and right facial droop with mild expressive dysphasia. Muscle strength 3-4/5 in left upper and lower extremities compared to 3/5 in the right upper and lower extremities with mild right hand contracture. Sensation intact. Gait not checked.  PSYCHIATRIC: The patient is alert and oriented x 3.  Normal affect and good eye contact. SKIN: No obvious rash, lesion, or ulcer.   LABORATORY PANEL:   CBC Recent Labs  Lab 02/25/23 0145  WBC 7.6  HGB 12.4  HCT 36.3  PLT 210   ------------------------------------------------------------------------------------------------------------------  Chemistries  Recent Labs  Lab 02/25/23 0145  NA 136  K 3.5  CL 102  CO2 19*  GLUCOSE 249*  BUN 22  CREATININE 1.93*  CALCIUM 8.7*  AST 30  ALT 15  ALKPHOS 76  BILITOT 0.9   ------------------------------------------------------------------------------------------------------------------  Cardiac Enzymes No results for input(s): "TROPONINI" in the last 168 hours. ------------------------------------------------------------------------------------------------------------------  RADIOLOGY:  CT ANGIO HEAD NECK W WO CM W PERF (CODE STROKE)  Result Date: 02/25/2023 CLINICAL DATA:  Right-sided gaze deviation EXAM: CT ANGIOGRAPHY HEAD AND NECK CT PERFUSION BRAIN TECHNIQUE: Multidetector CT imaging of the head and neck was performed using the standard protocol during bolus administration of intravenous contrast. Multiplanar CT image reconstructions and MIPs were obtained to evaluate the vascular anatomy. Carotid stenosis measurements (when applicable) are obtained utilizing NASCET criteria, using the distal internal carotid diameter as the denominator. Multiphase CT imaging of the brain was performed following IV bolus contrast injection. Subsequent parametric perfusion maps were calculated using RAPID software. RADIATION DOSE REDUCTION: This exam was performed according to the departmental  dose-optimization program which includes automated exposure control, adjustment of the mA and/or kV according to patient size and/or use of iterative reconstruction technique. CONTRAST:  OMNIPAQUE IOHEXOL 350 MG/ML SOLN COMPARISON:  CT head 02/25/2023, correlation is also made with CTA head and neck 01/01/2023 FINDINGS: CT HEAD FINDINGS For noncontrast findings, please see same day CT head. CTA NECK FINDINGS Aortic arch: Standard branching. Imaged portion shows no evidence of aneurysm or dissection. No significant stenosis of the major arch vessel origins. Right carotid system: No evidence of dissection, occlusion, or hemodynamically significant stenosis (greater than 50%). Left carotid system: No evidence of dissection, occlusion, or hemodynamically significant stenosis (greater than 50%). Vertebral arteries: No evidence of dissection, occlusion, or hemodynamically significant stenosis (greater than 50%). Skeleton: No acute osseous abnormality. Degenerative changes in the cervical spine. Other neck: Hypoenhancing nodules in the thyroid, the largest of which measures up to 9 mm, for which no follow-up is currently indicated. (Reference: J Am Coll Radiol. 2015 Feb;12(2): 143-50) Upper chest: No focal pulmonary opacity or pleural effusion. Review of the MIP images confirms the above findings CTA HEAD FINDINGS  Anterior circulation: Both internal carotid arteries are patent to the termini, with calcifications but without significant stenosis. Patent right A1. Aplastic left A1. Normal anterior communicating artery. Redemonstrated short segment occlusion of left A2 (series 7, image 108), with additional areas of focal moderate stenosis more distally (series 7, image 96), unchanged. Anterior cerebral arteries are otherwise patent to their distal aspects without significant stenosis. Severe stenosis in the distal M1 segments (series 7, images 114 and 118). Additional multifocal severe stenosis throughout the bilateral  MCA branches, similar to prior. Posterior circulation: Vertebral arteries patent to the vertebrobasilar junction without significant stenosis. Posterior inferior cerebellar arteries patent proximally. Basilar patent to its distal aspect without significant stenosis. Superior cerebellar arteries patent proximally. Redemonstrated occlusion of the left PCA. Redemonstrated multifocal moderate to severe stenosis in the right PCA branches. Venous sinuses: Not well opacified due to arterial timing. Anatomic variants: None significant. Review of the MIP images confirms the above findings CT Brain Perfusion Findings: ASPECTS: 10 CBF (<30%) Volume: 9mL Perfusion (Tmax>6.0s) volume: 20mL Mismatch Volume: 11mL Infarction Location:The infarct core is in the left parietal lobe, with the penumbra in the left parietal and left frontal lobe. Some of this may correlate with encephalomalacia in the left PCA territory. The left frontal lobe penumbra may be artifactual, given motion. IMPRESSION: 1. No new intracranial large vessel occlusion. Redemonstrated short segment occlusion of the left A2, with additional areas of focal moderate stenosis more distally in the ACAs. 2. Redemonstrated occlusion of the left PCA with multifocal moderate to severe stenosis in the right PCA. 3. Multifocal severe stenosis throughout the bilateral MCA branches. 4. No hemodynamically significant stenosis in the neck. 5. Perfusion imaging demonstrates an infarct core in the left parietal lobe, with the number in the left parietal and left frontal lobe. The left parietal findings likely correlate with the chronic encephalomalacia in the left PCA territory. In the inferior left frontal lobe penumbra may be artifactual, secondary to motion. Electronically Signed   By: Wiliam Ke M.D.   On: 02/25/2023 03:07   DG Chest Portable 1 View  Result Date: 02/25/2023 CLINICAL DATA:  Status post central line placement EXAM: PORTABLE CHEST 1 VIEW COMPARISON:   01/01/2019 FINDINGS: Cardiac shadow is within normal limits. Lungs are well aerated bilaterally. Right jugular central line is noted with the tip at the inferior cavoatrial junction. This should be withdrawn several cm. No pneumothorax is seen. No bony abnormality is noted. IMPRESSION: Status post central line placement without evidence of pneumothorax. Catheter should be withdrawn several cm. Electronically Signed   By: Alcide Clever M.D.   On: 02/25/2023 02:27   CT HEAD CODE STROKE WO CONTRAST  Result Date: 02/25/2023 CLINICAL DATA:  Code stroke.  Stroke suspected, nonverbal EXAM: CT HEAD WITHOUT CONTRAST TECHNIQUE: Contiguous axial images were obtained from the base of the skull through the vertex without intravenous contrast. RADIATION DOSE REDUCTION: This exam was performed according to the departmental dose-optimization program which includes automated exposure control, adjustment of the mA and/or kV according to patient size and/or use of iterative reconstruction technique. COMPARISON:  01/21/2023 FINDINGS: Brain: No evidence of acute infarction, hemorrhage, mass, mass effect, or midline shift. No hydrocephalus or extra-axial collection. Redemonstrated remote infarct involving the left PCA territory. Periventricular white matter changes, likely the sequela of chronic small vessel ischemic disease. Vascular: No hyperdense vessel. Atherosclerotic calcifications in the intracranial carotid and vertebral arteries. Skull: Negative for fracture or focal lesion. Sinuses/Orbits: Small right maxillary mucous retention cyst. Otherwise clear paranasal sinuses.  No acute finding in the orbits. Other: The mastoid air cells are well aerated. ASPECTS [facility] Stroke Program Early CT Score) - Ganglionic level infarction (caudate, lentiform nuclei, internal capsule, insula, M1-M3 cortex): 7 - Supraganglionic infarction (M4-M6 cortex): 3 Total score (0-10 with 10 being normal): 10 IMPRESSION: 1. No acute intracranial  process. ASPECTS is 10. 2. Remote left PCA territory infarct. Code stroke imaging results were communicated on 02/25/2023 at 12:48 am to provider [facility] via telephone, who verbally acknowledged these results. Electronically Signed   By: Wiliam Ke M.D.   On: 02/25/2023 00:49   DG Abdomen 1 View  Result Date: 02/24/2023 CLINICAL DATA:  63 year old female with history of constipation. EXAM: ABDOMEN - 1 VIEW COMPARISON:  Abdominal radiograph 11/21/2022. FINDINGS: Gas and stool are seen scattered throughout the colon extending to the level of the distal rectum. No pathologic distension of small bowel is noted. Large volume of stool in the colon and rectum. No gross evidence of pneumoperitoneum. IMPRESSION: 1. Nonobstructive bowel gas pattern. 2. No pneumoperitoneum. 3. Relatively large volume of stool throughout the colon and rectum compatible with reported clinical history of constipation. Electronically Signed   By: Trudie Reed M.D.   On: 02/24/2023 06:15      IMPRESSION AND PLAN:  Assessment and Plan: * Acute CVA (cerebrovascular accident) (HCC) - This is manifested by aphasia and left-sided hemiparesis. - The patient will be admitted to an observation medically monitored bed.   - We will follow neuro checks q.4 hours for 24 hours.   - The patient will be placed on aspirin.   - Will obtain a brain MRI without contrast and 2D echo with bubble study .   - A neurology consultation  as well as physical/occupation/speech therapy consults will be obtained in a.m.Marland Kitchen - I notified Dr. Otelia Limes about the patient. - The patient will be placed on statin therapy and fasting lipids will be checked.   Type II diabetes mellitus with renal manifestations (HCC) - The patient will be placed supplemental coverage with NovoLog I will continue basal coverage.  Dyslipidemia - We will continue statin therapy and check fasting lipids.  Seizure disorder (HCC) - We will continue Keppra.  Essential  hypertension - We will continue her antihypertensives with permissive parameters.   DVT prophylaxis: Lovenox.  Advanced Care Planning:  Code Status: The patient is DNR and DNI.  This was discussed with her and her husband. Family Communication:  The plan of care was discussed in details with the patient (and family). I answered all questions. The patient agreed to proceed with the above mentioned plan. Further management will depend upon hospital course. Disposition Plan: Back to previous home environment Consults called: Neurology All the records are reviewed and case discussed with ED provider.  Status is: Observation  I certify that at the time of admission, it is my clinical judgment that the patient will require hospital care extending less than 2 midnights.                            Dispo: The patient is from: Home              Anticipated d/c is to: Home              Patient currently is not medically stable to d/c.              Difficult to place patient: No  Hannah Beat M.D on 02/25/2023  at 5:43 AM  [facility]   From 7 PM-7 AM, contact night-coverage [email or web address]  CC: Primary care physician; Tobey Grim, MD

## 2023-02-25 NOTE — ED Notes (Signed)
Code Stroke called in the field by EMS

## 2023-02-25 NOTE — Evaluation (Signed)
Speech Language Pathology Evaluation Patient Details Name: [name] Gilbert MRN: [id number] DOB: 04-19-1961 Today's Date: 02/25/2023 Time: 4782-9562 SLP Time Calculation (min) (ACUTE ONLY): 12 min  Problem List:  Patient Active Problem List   Diagnosis Date Noted   Acute CVA (cerebrovascular accident) (HCC) 02/25/2023   Expressive aphasia 02/25/2023   Acute proctitis 01/22/2023   Acute renal failure superimposed on stage 3b chronic kidney disease (HCC) 01/22/2023   Constipation 01/22/2023   History of stroke 01/22/2023   TIA (transient ischemic attack) 01/01/2023   Essential hypertension 01/01/2023   Seizure disorder (HCC) 01/01/2023   Dyslipidemia 01/01/2023   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   Advanced care planning/counseling discussion 02/02/2022   Left thalamic infarction (HCC) 02/02/2022   Hyperkalemia 02/01/2022   Closed fracture of right distal fibula    HLD (hyperlipidemia) 01/25/2022   Type II diabetes mellitus with renal manifestations (HCC) 01/25/2022   Stage 3b chronic kidney disease (CKD) (HCC) 01/25/2022   Neurologic abnormality 06/13/2021   Dehydration 06/12/2021   Headache 06/12/2021   Abdominal pain 06/12/2021   Hypoglycemia associated with diabetes (HCC) 06/12/2021   Acute respiratory failure with hypoxia (HCC)    HHNC (hyperglycemic hyperosmolar nonketotic coma) (HCC) 10/01/2019   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 Pt is being treated for acute CVA. PMH: DM2, CKD 3B, seizure disorder, HTN, CVA. MRI, 02/25/23, "1. Small acute posterior left frontal lobe subcortical white matter  infarct, near the left centrum semiovale.  And contralateral posterior right frontal lobe white matter infarct  which appears recurrent for extended from similar ischemia in April.  No associated hemorrhage or mass effect.     2. Otherwise stable advanced chronic ischemic changes in the brain,  disproportionately affecting the PCA territories."   Assessment / Plan / Recommendation Clinical Impression  Pt seen for cognitive-linguistic evaluation. Evaluation completed via informal means. Pt presents with cognitive-linguistic deficits affecting orientation, attention, memory, problem solving/insight, safety awareness, and pragmatics. Noted pt with hx of cognitive-linguistic deficits documented most recently in April 2024. Findings from today's assessment are similar to previous evaluations; ?baseline level of functioning. No family present to provide collateral.  Pt's significant attentional deficits affecting all other domains of cognition and language. Pt with difficulty attending to task to answer basic/environmental yes/no questions and follow 1-step commands. Pt's speech is often limited to single words with reduced vocal loudness and mildly articulatory imprecision. R facial droop appreciated at rest. Pt unable to follow commands for OME. Pt with reduced speech initiation, topic maintenance, and eye contact. Verbal perseveration noted. Flat affect appreciated. SLP to continue to f/u per POC for functional cognitive-linguistic tx, as appropriate.    SLP Assessment  SLP Recommendation/Assessment: Patient needs continued Speech Lanaguage Pathology Services SLP Visit Diagnosis: Cognitive communication deficit (R41.841)    Recommendations for follow up therapy are one component of a  multi-disciplinary discharge planning process, led by the attending  physician.  Recommendations may be updated based on patient status, additional functional criteria and insurance authorization.    Follow Up Recommendations  Skilled nursing-short term rehab (<3 hours/day)    Assistance Recommended at Discharge  Frequent or constant Supervision/Assistance  Functional Status Assessment Patient has had a recent decline in their functional status and demonstrates the ability to make significant improvements in function in a reasonable and predictable amount of time.  Frequency and Duration min 2x/week  2 weeks      SLP Evaluation Cognition  Overall Cognitive Status: No family/caregiver present to determine baseline cognitive functioning Orientation Level: Oriented to person (not to DOB, day of week, date, situation, or location) Attention: Focused;Sustained Focused Attention: Impaired Focused Attention Impairment: Verbal basic Sustained Attention: Impaired Sustained Attention Impairment: Verbal basic Memory: Impaired Memory Impairment: Storage deficit;Retrieval deficit;Decreased recall of new information Awareness: Impaired Awareness Impairment: Intellectual impairment;Emergent impairment;Anticipatory impairment Problem Solving: Impaired Problem Solving Impairment: Verbal basic Behaviors: Restless;Perseveration;Confabulation Safety/Judgment: Impaired       Comprehension  Auditory Comprehension Overall Auditory Comprehension: Impaired Yes/No Questions: Impaired Basic Biographical Questions: 51-75% accurate Basic Immediate Environment Questions: 50-74% accurate Complex Questions: 25-49% accurate Commands: Impaired Two Step Basic Commands: 0-24% accurate Conversation: Simple Interfering Components: Attention;Processing speed Visual Recognition/Discrimination Discrimination: Not tested Reading Comprehension Reading Status: Not tested    Expression Expression Primary Mode of  Expression: Verbal Verbal Expression Overall Verbal Expression: Impaired Initiation: Impaired Automatic Speech: Name;Social Response (WFL) Level of Generative/Spontaneous Verbalization: Word;Phrase Repetition: Impaired Level of Impairment: Word level;Phrase level;Sentence level Naming: Impairment Confrontation: Impaired Verbal Errors: Perseveration;Not aware of errors Pragmatics: Impairment Impairments: Abnormal affect;Eye contact;Topic maintenance;Turn Taking Interfering Components: Attention Written Expression Dominant Hand:  (unsure. RUE has high tone at baseline from prior CVA per notes.) Written Expression: Not tested   Oral / Motor  Oral Motor/Sensory Function Overall Oral Motor/Sensory Function: Mild impairment Facial ROM: Reduced right Facial Symmetry: Abnormal symmetry right Lingual Strength:  (UTA) Motor Speech Overall Motor Speech: Impaired Respiration: Within functional limits Phonation: Low vocal intensity Resonance: Within functional limits Articulation: Impaired Intelligibility: Intelligibility reduced Word: 75-100% accurate Phrase: 75-100% accurate Sentence: 75-100% accurate Conversation: 75-100% accurate Motor Planning: Witnin functional limits           Clyde Canterbury, M.S., CCC-SLP Speech-Language Pathologist [facility] [phone] (ASCOM)  Woodroe Chen 02/25/2023, 12:38 PM

## 2023-02-25 NOTE — Assessment & Plan Note (Addendum)
Continue Norvasc and Toprol.  Added Imdur with blood pressure being a little high.

## 2023-02-25 NOTE — ED Notes (Addendum)
CBG 297 

## 2023-02-25 NOTE — Evaluation (Signed)
Physical Therapy Evaluation Patient Details Name: [name] Gilbert MRN: [id number] DOB: 1961-08-29 Today's Date: 02/25/2023  History of Present Illness  Pt is a 62 y/o F admitted on 02/25/23 after presenting with acut onset of expressive aphasia & R gaze preference with L sided weakness. Pt is being treated for acute CVA. PMH: DM2, CKD 3B, seizure disorder, HTN, CVA  Clinical Impression  Pt seen for PT evaluation with co-tx with OT for pt & therapists' safety. Pt unable to provide full PLOF nor home set up (attempted to contact pt's husband via telephone but unsuccessful). Pt requires max assist +2 for supine<>sit, max assist +2 for STS attempts but pt unable to clear buttocks from elevated EOB. Pt presents with L inattention, head rotated to the R. Recommend ongoing PT services to address strengthening, endurance, balance, to increase independence with bed mobility, transfers & decreased caregiver burden.       Recommendations for follow up therapy are one component of a multi-disciplinary discharge planning process, led by the attending physician.  Recommendations may be updated based on patient status, additional functional criteria and insurance authorization.  Follow Up Recommendations Can patient physically be transported by private vehicle: No     Assistance Recommended at Discharge Frequent or constant Supervision/Assistance  Patient can return home with the following  Two people to help with bathing/dressing/bathroom;Two people to help with walking and/or transfers;Help with stairs or ramp for entrance;Direct supervision/assist for medications management;Assistance with feeding;Assist for transportation;Direct supervision/assist for financial management;Assistance with cooking/housework    Equipment Recommendations None recommended by PT (TBD in next venue)  Recommendations for Other Services       Functional Status Assessment Patient has had a recent decline in their functional  status and demonstrates the ability to make significant improvements in function in a reasonable and predictable amount of time.     Precautions / Restrictions Precautions Precautions: Fall Precaution Comments: hx R hemi Restrictions Weight Bearing Restrictions: No      Mobility  Bed Mobility Overal bed mobility: Needs Assistance Bed Mobility: Supine to Sit, Sit to Supine, Rolling Rolling: Max assist, Total assist, +2 for physical assistance   Supine to sit: Max assist, +2 for physical assistance Sit to supine: Max assist, HOB elevated        Transfers Overall transfer level: Needs assistance Equipment used: 2 person hand held assist Transfers: Sit to/from Stand Sit to Stand: Max assist, +2 physical assistance           General transfer comment: attempted STS from elevated edge of stretcher with PT blocking R knee, pt unable to clear buttocks    Ambulation/Gait                  Stairs            Wheelchair Mobility    Modified Rankin (Stroke Patients Only)       Balance Overall balance assessment: Needs assistance Sitting-balance support: Feet supported, Feet unsupported, Bilateral upper extremity supported Sitting balance-Leahy Scale: Poor   Postural control: Posterior lean                                   Pertinent Vitals/Pain Pain Assessment Pain Assessment: Faces Faces Pain Scale: Hurts little more Pain Location: RLE with some movement Pain Descriptors / Indicators: Discomfort, Grimacing Pain Intervention(s): Repositioned, Limited activity within patient's tolerance, Monitored during session    Home Living  Additional Comments: Per chart & pt, she arrived from home.    Prior Function               Mobility Comments: Pt is a poor historian, reports she was able to transfer bed<>w/c but unable to state how she does it. Does note she does not walk.       Hand Dominance         Extremity/Trunk Assessment   Upper Extremity Assessment Upper Extremity Assessment: Defer to OT evaluation (BUE weakness, pt received with RUE elbow & wrist flexed into trunk but able to somewhat extend it)    Lower Extremity Assessment Lower Extremity Assessment: Difficult to assess due to impaired cognition;Generalized weakness    Cervical / Trunk Assessment Cervical / Trunk Assessment:  (head rotated R)  Communication   Communication: Expressive difficulties  Cognition Arousal/Alertness: Awake/alert Behavior During Therapy: WFL for tasks assessed/performed Overall Cognitive Status: No family/caregiver present to determine baseline cognitive functioning                                 General Comments: Pt with some word finding difficulties, impaired memory, decreased ability to follow simple commands throughout session.        General Comments      Exercises     Assessment/Plan    PT Assessment Patient needs continued PT services  PT Problem List Decreased strength;Decreased coordination;Decreased range of motion;Decreased activity tolerance;Decreased balance;Decreased mobility;Decreased safety awareness;Decreased knowledge of use of DME;Decreased cognition;Pain       PT Treatment Interventions DME instruction;Therapeutic exercise;Gait training;Balance training;Neuromuscular re-education;Functional mobility training;Cognitive remediation;Therapeutic activities;Patient/family education;Modalities    PT Goals (Current goals can be found in the Care Plan section)  Acute Rehab PT Goals PT Goal Formulation: Patient unable to participate in goal setting Time For Goal Achievement: 03/11/23 Potential to Achieve Goals: Fair    Frequency Min 3X/week     Co-evaluation PT/OT/SLP Co-Evaluation/Treatment: Yes Reason for Co-Treatment: For patient/therapist safety;Necessary to address cognition/behavior during functional activity;Complexity of the patient's  impairments (multi-system involvement) PT goals addressed during session: Balance;Mobility/safety with mobility         AM-PAC PT "6 Clicks" Mobility  Outcome Measure Help needed turning from your back to your side while in a flat bed without using bedrails?: Total Help needed moving from lying on your back to sitting on the side of a flat bed without using bedrails?: Total Help needed moving to and from a bed to a chair (including a wheelchair)?: Total Help needed standing up from a chair using your arms (e.g., wheelchair or bedside chair)?: Total Help needed to walk in hospital room?: Total Help needed climbing 3-5 steps with a railing? : Total 6 Click Score: 6    End of Session   Activity Tolerance: Patient tolerated treatment well Patient left: in bed (in care of OT) Nurse Communication: Mobility status PT Visit Diagnosis: Muscle weakness (generalized) (M62.81);Hemiplegia and hemiparesis;Difficulty in walking, not elsewhere classified (R26.2);Other abnormalities of gait and mobility (R26.89);Unsteadiness on feet (R26.81) Hemiplegia - Right/Left: Right    Time: 1610-9604 PT Time Calculation (min) (ACUTE ONLY): 14 min   Charges:   PT Evaluation $PT Eval Moderate Complexity: 1 Mod          Aleda Grana, PT, DPT 02/25/23, 9:47 AM   Sandi Mariscal 02/25/2023, 9:44 AM

## 2023-02-25 NOTE — Consult Note (Signed)
Follow up on CTA/CTP results (covering for Dr. Juliane Poot), no LVO, no indications for acute NIR. Please follow recommendations per initial Neurology Consult. Recall [facility] if questions.

## 2023-02-25 NOTE — ED Notes (Signed)
Pt in CT at this time, EDP Bradler attempt [place] IV for D50 Administration and possible CT Angio/Perfusion.

## 2023-02-25 NOTE — Assessment & Plan Note (Signed)
On Crestor 20 mg daily.  LDL 141

## 2023-02-25 NOTE — ED Notes (Incomplete)
Pt remains in CT at this time attempting to get [place] IV. Primary RN, EDP Bradler remain with patietn.

## 2023-02-25 NOTE — Consult Note (Signed)
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 LKN 8pm per ems now nonverbal with right gaze and left sided weakness with AMS/aphasia. Her MRS is a 4. Ddx includes new cva, seizure. Correction of hypoglycemia per primary team although doubt this explains entire clinical picture. CTAs pending patient is a difficult stick. IF new LVo seen will consult NIR although given MRS4 unclear if NIR candidate. She will need MRi brain, EEG, toxic metabolic workup regardless.  Our recommendations are outlined below.  Recommendations:        Stroke/Telemetry Floor       Neuro Checks       Bedside Swallow Eval       DVT Prophylaxis       IV Fluids, Normal Saline       Head of Bed 30 Degrees       Euglycemia and Avoid Hyperthermia (PRN Acetaminophen)       Aspirin per rectum  Sign Out:       Discussed with Emergency Department Provider    ------------------------------------------------------------------------------  Advanced Imaging: Advanced imaging has been ordered. Results pending.   Metrics: Last Known Well: 02/24/2023 20:00:00 [facility] Notification Time: 02/25/2023 00:47:12 Arrival Time: 02/25/2023 00:32:00 Stamp Time: 02/25/2023 00:47:12 Initial Response Time: 02/25/2023 00:49:37 Symptoms: altered mental status/aphasia. Initial patient interaction: 02/25/2023 00:57:15 NIHSS Assessment Completed: 02/25/2023 01:03:11 Patient is not a candidate for Thrombolytic. Thrombolytic Medical Decision: 02/25/2023  01:03:13 Patient was not deemed candidate for Thrombolytic because of following reasons: Current or Previous ICH.  CT head showed no acute hemorrhage or acute core infarct. I personally Reviewed the CT Head and it Showed remote left PCA stroke  Primary Provider Notified of Diagnostic Impression and Management Plan on: 02/25/2023 01:20:30    ------------------------------------------------------------------------------  History of Present Illness: Patient is a 62 year old Female.  Patient was brought by EMS for symptoms of altered mental status/aphasia.  The patient is a 62 year old woman with a history of DM, HTN, CHF, stroke with hemorrhage and seizure disorder. She has residual right sided weakness from a prior stroke. LKN 8pm per EMS. She is not speaking weak all over right gaze. Her BG is 64. she is not ambulatory at baseline. Also has CKD.  In Feb of this year MRI brain showed focus of hemorrhage in the central pons. 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 She has occluded PCA as cause of prior cva   Past Medical History:      Hypertension      Diabetes Mellitus      Hyperlipidemia      Stroke      Seizures  Medications:  No Anticoagulant use  Antiplatelet use: Yes plavix Reviewed EMR for current medications  Allergies:  Reviewed  Social History: Drug Use: No  Family History:  There is no family history of premature cerebrovascular disease pertinent to this consultation  ROS : 14 Points Review of Systems was performed and was negative except mentioned in HPI.  Past Surgical History: There Is No Surgical History Contributory To Today's Visit    Examination: BP(157//75), Pulse(75), Blood Glucose(64) 1A: Level of Consciousness - Requires repeated stimulation to arouse + 2 1B: Ask Month and Age - Could Not Answer Either Question Correctly + 2 1C: Blink Eyes & Squeeze Hands - Performs 0 Tasks + 2 2: Test Horizontal  Extraocular Movements - Forced Gaze Palsy: Cannot Be Overcome + 2 3: Test Visual Fields - No Visual Loss + 0 4: Test Facial Palsy (Use Grimace if Obtunded) - Normal symmetry + 0 5A: Test Left Arm Motor Drift - No Effort Against Gravity + 3 5B: Test Right Arm Motor Drift - No Effort Against Gravity + 3 6A: Test Left Leg Motor Drift - No Movement + 4 6B: Test Right Leg Motor Drift - No Effort Against Gravity + 3 7: Test Limb Ataxia (FNF/Heel-Shin) - No Ataxia + 0 8: Test Sensation - Normal; No sensory loss + 0 9: Test Language/Aphasia - Mute/Global Aphasia: No Usable Speech/Auditory Comprehension + 3 10: Test Dysarthria - Mute/Anarthric + 2 11: Test Extinction/Inattention - Visual/tactile/auditory/spatial/personal inattention + 1  NIHSS Score: 27   Pre-Morbid Modified Rankin Scale: 4 Points = Moderately severe disability; unable to walk and attend to bodily needs without assistance  Spoke with : ED MD  This consult was conducted in real time using interactive audio and Immunologist. Patient was informed of the technology being used for this visit and agreed to proceed. Patient located in hospital and provider located at home/office setting.   Patient is being evaluated for possible acute neurologic impairment and high probability of imminent or life-threatening deterioration. I spent total of 35 minutes providing care to this patient, including time for face to face visit via telemedicine, review of medical records, imaging studies and discussion of findings with providers, the patient and/or family.   Dr Jossie Ng   [facility] For Inpatient follow-up with [facility] physician please call [facility] [phone]. This is not an outpatient service. Post hospital discharge, please contact hospital directly.  Please do not communicate with [facility] physicians via secure chat. If you have any questions, Please contact [facility]. Please call or reconsult our service if  there are any clinical or diagnostic changes.

## 2023-02-25 NOTE — ED Notes (Signed)
Pt began to gag and try to vomit while laying down. Pt sat up by RN and CT tech. Suction performed to remove excess secretions.

## 2023-02-25 NOTE — ED Notes (Signed)
Pt remains in CT at this time attempting to get [place] IV.

## 2023-02-26 ENCOUNTER — Observation Stay (HOSPITAL_BASED_OUTPATIENT_CLINIC_OR_DEPARTMENT_OTHER)
Admit: 2023-02-26 | Discharge: 2023-02-26 | Disposition: A | Discharge status: Home / Self Care | Payer: Medicare HMO | Attending: Family Medicine | Admitting: Family Medicine

## 2023-02-26 ENCOUNTER — Observation Stay: Payer: Medicare HMO

## 2023-02-26 ENCOUNTER — Ambulatory Visit: Payer: Medicare HMO

## 2023-02-26 ENCOUNTER — Inpatient Hospital Stay: Payer: Medicare HMO

## 2023-02-26 DIAGNOSIS — N390 Urinary tract infection, site not specified: Secondary | ICD-10-CM | POA: Diagnosis present

## 2023-02-26 DIAGNOSIS — E162 Hypoglycemia, unspecified: Secondary | ICD-10-CM

## 2023-02-26 DIAGNOSIS — I6389 Other cerebral infarction: Secondary | ICD-10-CM

## 2023-02-26 DIAGNOSIS — I639 Cerebral infarction, unspecified: Secondary | ICD-10-CM | POA: Diagnosis not present

## 2023-02-26 DIAGNOSIS — Z79899 Other long term (current) drug therapy: Secondary | ICD-10-CM | POA: Diagnosis not present

## 2023-02-26 DIAGNOSIS — R414 Neurologic neglect syndrome: Secondary | ICD-10-CM | POA: Diagnosis present

## 2023-02-26 DIAGNOSIS — E7800 Pure hypercholesterolemia, unspecified: Secondary | ICD-10-CM | POA: Diagnosis present

## 2023-02-26 DIAGNOSIS — E785 Hyperlipidemia, unspecified: Secondary | ICD-10-CM | POA: Diagnosis not present

## 2023-02-26 DIAGNOSIS — E1121 Type 2 diabetes mellitus with diabetic nephropathy: Secondary | ICD-10-CM | POA: Diagnosis not present

## 2023-02-26 DIAGNOSIS — E1122 Type 2 diabetes mellitus with diabetic chronic kidney disease: Secondary | ICD-10-CM

## 2023-02-26 DIAGNOSIS — R4182 Altered mental status, unspecified: Secondary | ICD-10-CM | POA: Diagnosis not present

## 2023-02-26 DIAGNOSIS — R4701 Aphasia: Secondary | ICD-10-CM | POA: Diagnosis present

## 2023-02-26 DIAGNOSIS — Z794 Long term (current) use of insulin: Secondary | ICD-10-CM | POA: Diagnosis not present

## 2023-02-26 DIAGNOSIS — R9431 Abnormal electrocardiogram [ECG] [EKG]: Secondary | ICD-10-CM | POA: Diagnosis present

## 2023-02-26 DIAGNOSIS — Z66 Do not resuscitate: Secondary | ICD-10-CM | POA: Diagnosis present

## 2023-02-26 DIAGNOSIS — I10 Essential (primary) hypertension: Secondary | ICD-10-CM | POA: Diagnosis not present

## 2023-02-26 DIAGNOSIS — K7469 Other cirrhosis of liver: Secondary | ICD-10-CM | POA: Diagnosis not present

## 2023-02-26 DIAGNOSIS — M6281 Muscle weakness (generalized): Secondary | ICD-10-CM

## 2023-02-26 DIAGNOSIS — E11649 Type 2 diabetes mellitus with hypoglycemia without coma: Secondary | ICD-10-CM | POA: Diagnosis present

## 2023-02-26 DIAGNOSIS — E875 Hyperkalemia: Secondary | ICD-10-CM | POA: Diagnosis present

## 2023-02-26 DIAGNOSIS — D649 Anemia, unspecified: Secondary | ICD-10-CM | POA: Diagnosis not present

## 2023-02-26 DIAGNOSIS — Z888 Allergy status to other drugs, medicaments and biological substances status: Secondary | ICD-10-CM | POA: Diagnosis not present

## 2023-02-26 DIAGNOSIS — E1165 Type 2 diabetes mellitus with hyperglycemia: Secondary | ICD-10-CM | POA: Diagnosis present

## 2023-02-26 DIAGNOSIS — G40909 Epilepsy, unspecified, not intractable, without status epilepticus: Secondary | ICD-10-CM | POA: Diagnosis present

## 2023-02-26 DIAGNOSIS — D509 Iron deficiency anemia, unspecified: Secondary | ICD-10-CM | POA: Diagnosis present

## 2023-02-26 DIAGNOSIS — I130 Hypertensive heart and chronic kidney disease with heart failure and stage 1 through stage 4 chronic kidney disease, or unspecified chronic kidney disease: Secondary | ICD-10-CM | POA: Diagnosis present

## 2023-02-26 DIAGNOSIS — N179 Acute kidney failure, unspecified: Secondary | ICD-10-CM | POA: Diagnosis present

## 2023-02-26 DIAGNOSIS — Z880 Allergy status to penicillin: Secondary | ICD-10-CM | POA: Diagnosis not present

## 2023-02-26 DIAGNOSIS — N183 Chronic kidney disease, stage 3 unspecified: Secondary | ICD-10-CM

## 2023-02-26 DIAGNOSIS — N184 Chronic kidney disease, stage 4 (severe): Secondary | ICD-10-CM | POA: Diagnosis present

## 2023-02-26 DIAGNOSIS — I6300 Cerebral infarction due to thrombosis of unspecified precerebral artery: Secondary | ICD-10-CM | POA: Diagnosis present

## 2023-02-26 DIAGNOSIS — K7460 Unspecified cirrhosis of liver: Secondary | ICD-10-CM | POA: Diagnosis present

## 2023-02-26 DIAGNOSIS — G8194 Hemiplegia, unspecified affecting left nondominant side: Secondary | ICD-10-CM | POA: Diagnosis present

## 2023-02-26 DIAGNOSIS — R29727 NIHSS score 27: Secondary | ICD-10-CM | POA: Diagnosis present

## 2023-02-26 DIAGNOSIS — D508 Other iron deficiency anemias: Secondary | ICD-10-CM | POA: Diagnosis not present

## 2023-02-26 DIAGNOSIS — I69351 Hemiplegia and hemiparesis following cerebral infarction affecting right dominant side: Secondary | ICD-10-CM | POA: Diagnosis not present

## 2023-02-26 LAB — BASIC METABOLIC PANEL
Anion gap: 6 (ref 5–15)
BUN: 16 mg/dL (ref 8–23)
CO2: 19 mmol/L — ABNORMAL LOW (ref 22–32)
Calcium: 7.6 mg/dL — ABNORMAL LOW (ref 8.9–10.3)
Chloride: 108 mmol/L (ref 98–111)
Creatinine, Ser: 1.95 mg/dL — ABNORMAL HIGH (ref 0.44–1.00)
GFR, Estimated: 28 mL/min — ABNORMAL LOW (ref >60)
Glucose, Bld: 330 mg/dL — ABNORMAL HIGH (ref 70–99)
Potassium: 4.7 mmol/L (ref 3.5–5.1)
Sodium: 133 mmol/L — ABNORMAL LOW (ref 135–145)

## 2023-02-26 LAB — GLUCOSE, CAPILLARY
Glucose-Capillary: 116 mg/dL — ABNORMAL HIGH (ref 70–99)
Glucose-Capillary: 197 mg/dL — ABNORMAL HIGH (ref 70–99)
Glucose-Capillary: 277 mg/dL — ABNORMAL HIGH (ref 70–99)
Glucose-Capillary: 66 mg/dL — ABNORMAL LOW (ref 70–99)
Glucose-Capillary: 91 mg/dL (ref 70–99)

## 2023-02-26 LAB — ECHOCARDIOGRAM COMPLETE BUBBLE STUDY
AR max vel: 2.23 cm2
AV Area VTI: 2.87 cm2
AV Area mean vel: 2.4 cm2
AV Mean grad: 3 mmHg
AV Peak grad: 4.8 mmHg
Ao pk vel: 1.1 m/s
Area-P 1/2: 3.74 cm2
MV VTI: 2.75 cm2
S' Lateral: 2 cm

## 2023-02-26 MED ORDER — CHLORHEXIDINE GLUCONATE CLOTH 2 % EX PADS
6.0000 | MEDICATED_PAD | Freq: Every day | CUTANEOUS | Status: DC
  Administered 2023-02-26 – 2023-03-05 (×8): 6 via TOPICAL

## 2023-02-26 MED ORDER — SULFAMETHOXAZOLE-TRIMETHOPRIM 800-160 MG PO TABS: 1.0000 | ORAL_TABLET | Freq: Two times a day (BID) | ORAL | Status: DC

## 2023-02-26 MED ORDER — SULFAMETHOXAZOLE-TRIMETHOPRIM 400-80 MG PO TABS
1.0000 | ORAL_TABLET | Freq: Two times a day (BID) | ORAL | Status: AC
  Administered 2023-02-26 – 2023-03-01 (×7): 1 via ORAL
  Filled 2023-02-26 (×7): qty 1

## 2023-02-26 MED ORDER — INSULIN ASPART 100 UNIT/ML IJ SOLN
0.0000 [IU] | Freq: Every day | INTRAMUSCULAR | Status: DC
  Administered 2023-02-27 – 2023-03-11 (×4): 2 [IU] via SUBCUTANEOUS
  Filled 2023-02-26 (×4): qty 1

## 2023-02-26 MED ORDER — SULFAMETHOXAZOLE-TRIMETHOPRIM 800-160 MG PO TABS
1.0000 | ORAL_TABLET | Freq: Once | ORAL | Status: AC
  Administered 2023-02-26: 1 via ORAL
  Filled 2023-02-26: qty 1

## 2023-02-26 MED ORDER — SODIUM CHLORIDE 0.9 % IV SOLN
INTRAVENOUS | Status: DC
  Administered 2023-02-27: 1000 mL via INTRAVENOUS

## 2023-02-26 MED ORDER — INSULIN ASPART 100 UNIT/ML IJ SOLN
0.0000 [IU] | Freq: Three times a day (TID) | INTRAMUSCULAR | Status: DC
  Administered 2023-02-26: 3 [IU] via SUBCUTANEOUS
  Administered 2023-02-27: 1 [IU] via SUBCUTANEOUS
  Administered 2023-02-28: 2 [IU] via SUBCUTANEOUS
  Administered 2023-02-28: 5 [IU] via SUBCUTANEOUS
  Administered 2023-03-01: 2 [IU] via SUBCUTANEOUS
  Administered 2023-03-01: 5 [IU] via SUBCUTANEOUS
  Administered 2023-03-01 – 2023-03-02 (×2): 1 [IU] via SUBCUTANEOUS
  Administered 2023-03-02: 7 [IU] via SUBCUTANEOUS
  Administered 2023-03-03 (×2): 3 [IU] via SUBCUTANEOUS
  Administered 2023-03-04: 2 [IU] via SUBCUTANEOUS
  Administered 2023-03-04: 3 [IU] via SUBCUTANEOUS
  Administered 2023-03-04 – 2023-03-05 (×2): 7 [IU] via SUBCUTANEOUS
  Administered 2023-03-05: 5 [IU] via SUBCUTANEOUS
  Administered 2023-03-05: 2 [IU] via SUBCUTANEOUS
  Administered 2023-03-06: 3 [IU] via SUBCUTANEOUS
  Administered 2023-03-06 (×2): 7 [IU] via SUBCUTANEOUS
  Administered 2023-03-07 (×3): 2 [IU] via SUBCUTANEOUS
  Administered 2023-03-08: 3 [IU] via SUBCUTANEOUS
  Administered 2023-03-08: 2 [IU] via SUBCUTANEOUS
  Administered 2023-03-08: 3 [IU] via SUBCUTANEOUS
  Administered 2023-03-09: 7 [IU] via SUBCUTANEOUS
  Administered 2023-03-09: 5 [IU] via SUBCUTANEOUS
  Administered 2023-03-09: 3 [IU] via SUBCUTANEOUS
  Administered 2023-03-10 (×2): 2 [IU] via SUBCUTANEOUS
  Administered 2023-03-10: 7 [IU] via SUBCUTANEOUS
  Administered 2023-03-11: 2 [IU] via SUBCUTANEOUS
  Administered 2023-03-11 – 2023-03-12 (×3): 5 [IU] via SUBCUTANEOUS
  Administered 2023-03-12: 3 [IU] via SUBCUTANEOUS
  Filled 2023-02-26 (×38): qty 1

## 2023-02-26 NOTE — Progress Notes (Signed)
Speech Language Pathology Treatment: Cognitive-Linquistic  Patient Details Name: [name] Gilbert MRN: [id number] DOB: 01-10-1961 Today's Date: 02/26/2023 Time: 4098-1191 SLP Time Calculation (min) (ACUTE ONLY): 15 min  Assessment / Plan / Recommendation Clinical Impression  Pt seen for follow SLP intervention targeting continued dynamic assessment in the setting of CVA. Severe attention deficit persists, with pt benefiting from tactile cues and verbal repetition for maintained engagement. Further assessment attempted for reading/visual processing and verbal expression. All spontaneous speech appropriate to simple question provided and limited to word-short phrase length- pt with flat affect throughout. Minimal engagement in reading stimuli, leading to discontinuation of task. No family present for session, limiting opportunity to establish pt's current variance from baseline. Recommend eliminating external distractions, provision of repetition, and clear questions/instructions. SLP will continue to follow per POC.    HPI HPI: Pt is a 62 y/o F admitted on 02/25/23 after presenting with acut onset of expressive aphasia & R gaze preference with L sided weakness. Pt is being treated for acute CVA. PMH: DM2, CKD 3B, seizure disorder, HTN, CVA. MRI, 02/25/23, "1. Small acute posterior left frontal lobe subcortical white matter  infarct, near the left centrum semiovale.  And contralateral posterior right frontal lobe white matter infarct  which appears recurrent for extended from similar ischemia in April.  No associated hemorrhage or mass effect.     2. Otherwise stable advanced chronic ischemic changes in the brain,  disproportionately affecting the PCA territories."      SLP Plan  Continue with current plan of care      Recommendations for follow up therapy are one component of a multi-disciplinary discharge planning process, led by the attending physician.  Recommendations may be updated based on  patient status, additional functional criteria and insurance authorization.    Recommendations               Frequent or constant Supervision/Assistance Cognitive communication deficit (R41.841)     Continue with current plan of care    [name] Jazlen Ogarro Clapp  MS [facility] SLP   [name] Gilbert  02/26/2023, 12:23 PM

## 2023-02-26 NOTE — TOC Initial Note (Signed)
Transition of Care [facility]) - Initial/Assessment Note    Patient Details  Name: [name] Gilbert MRN: [id number] Date of Birth: 1960/09/17  Transition of Care [facility]) CM/SW Contact:    Darolyn Rua, LCSW Phone Number: 02/26/2023, 2:08 PM  Clinical Narrative:                  Patient's spouse called this CSW back from Sunday, he reports being in agreement with SNF recommendations, reports no preference for facility just does not want [facility]. Agreeable for referrals to be sent for bed offers.   Informed RNCM of patient, Lucendia Herrlich, of above for her to complete fl2 and send out referrals.   Expected Discharge Plan: Skilled Nursing Facility Barriers to Discharge: Continued Medical Work up   Patient Goals and CMS Choice Patient states their goals for this hospitalization and ongoing recovery are:: to go home CMS Medicare.gov Compare Post Acute Care list provided to:: Patient Represenative (must comment) (spouse) Choice offered to / list presented to : Spouse      Expected Discharge Plan and Services                                              Prior Living Arrangements/Services                       Activities of Daily Living Home Assistive Devices/Equipment: None ADL Screening (condition at time of admission) Patient's cognitive ability adequate to safely complete daily activities?: No Is the patient deaf or have difficulty hearing?: No Does the patient have difficulty seeing, even when wearing glasses/contacts?: Yes Does the patient have difficulty concentrating, remembering, or making decisions?: Yes Patient able to express need for assistance with ADLs?: Yes Does the patient have difficulty dressing or bathing?: Yes Independently performs ADLs?: No Communication: Independent Dressing (OT): Dependent Is this a change from baseline?: Pre-admission baseline Grooming: Dependent Is this a change from baseline?: Pre-admission baseline Feeding:  Dependent Is this a change from baseline?: Pre-admission baseline Bathing: Dependent Is this a change from baseline?: Pre-admission baseline Toileting: Dependent Is this a change from baseline?: Pre-admission baseline In/Out Bed: Dependent Is this a change from baseline?: Pre-admission baseline Walks in Home: Dependent Is this a change from baseline?: Pre-admission baseline Does the patient have difficulty walking or climbing stairs?: Yes Weakness of Legs: Both Weakness of Arms/Hands: Both  Permission Sought/Granted                  Emotional Assessment              Admission diagnosis:  Aphasia [R47.01] Hypoglycemia [E16.2] CVA (cerebral vascular accident) (HCC) [I63.9] Muscle weakness of right upper extremity [M62.81] Altered mental status, unspecified altered mental status type [R41.82] Acute CVA (cerebrovascular accident) [facility]) [I63.9] Patient Active Problem List   Diagnosis Date Noted   Acute CVA (cerebrovascular accident) (HCC) 02/25/2023   Expressive aphasia 02/25/2023   Acute proctitis 01/22/2023   Acute renal failure superimposed on stage 3b chronic kidney disease (HCC) 01/22/2023   Constipation 01/22/2023   History of stroke 01/22/2023   TIA (transient ischemic attack) 01/01/2023   Essential hypertension 01/01/2023   Seizure disorder (HCC) 01/01/2023   Dyslipidemia 01/01/2023   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   Advanced care planning/counseling discussion 02/02/2022   Left thalamic infarction (HCC) 02/02/2022   Hyperkalemia 02/01/2022   Closed fracture  of right distal fibula    HLD (hyperlipidemia) 01/25/2022   Type II diabetes mellitus with renal manifestations (HCC) 01/25/2022   Stage 3b chronic kidney disease (CKD) (HCC) 01/25/2022   Neurologic abnormality 06/13/2021   Dehydration 06/12/2021   Headache 06/12/2021   Abdominal pain 06/12/2021   Hypoglycemia associated with diabetes (HCC) 06/12/2021   Acute respiratory failure with hypoxia  (HCC)    HHNC (hyperglycemic hyperosmolar nonketotic coma) (HCC) 10/01/2019   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 OF [address] [place] [address] [place] [address] Phone: [phone] Fax: [phone]  [facility] #[id number] [place], [place] - [address] AT [facility] OF [address] [place] [address] Phone: [phone] Fax: [phone]     Social Determinants of Health (SDOH) Social History: SDOH Screenings   Food Insecurity: No Food Insecurity (02/25/2023)  Housing: Low Risk  (02/25/2023)  Transportation Needs: No Transportation Needs (02/25/2023)  Utilities: Not At Risk (02/25/2023)  Tobacco Use: Low Risk  (02/25/2023)   SDOH Interventions:     Readmission Risk Interventions     No data to display

## 2023-02-26 NOTE — Progress Notes (Signed)
[facility]  ([facility]) Hospital Liaison note:  This patient is currently enrolled in [facility] outpatient-based palliative care.  [facility] will continue  to follow for discharge disposition.    Please call for any outpatient based palliative care related questions or concerns.  [facility] Liaison [phone] 

## 2023-02-26 NOTE — Procedures (Signed)
Routine EEG Report  [name] Gilbert is a 62 y.o. female with a history of altered mental status who is undergoing an EEG to evaluate for seizures.  Report: This EEG was acquired with electrodes placed according to the International 10-20 electrode system (including Fp1, Fp2, F3, F4, C3, C4, P3, P4, O1, O2, T3, T4, T5, T6, A1, A2, Fz, Cz, Pz). The following electrodes were missing or displaced: none.  The occipital dominant rhythm was 7 Hz. This activity is reactive to stimulation. Drowsiness was manifested by background fragmentation; deeper stages of sleep were identified by K complexes and sleep spindles. There was no focal slowing. There were no interictal epileptiform discharges. There were no electrographic seizures identified. There was no abnormal response to photic stimulation or hyperventilation.   Impression and clinical correlation: This EEG was obtained while awake and asleep and is abnormal due to mild diffuse slowing indicative of global cerebral dysfunction. Epileptiform abnormalities were not seen during this recording.  Bing Neighbors, MD [facility] [phone]  If 7pm- 7am, please page neurology on call as listed in AMION.

## 2023-02-26 NOTE — Progress Notes (Signed)
Eeg done 

## 2023-02-26 NOTE — Progress Notes (Signed)
PROGRESS NOTE    Kilie Melesio  ZOX:[id number] DOB: 03/23/1961 DOA: 02/25/2023 PCP: Tobey Grim, MD    Brief Narrative:  62 year old African-American female history significant for type 2 diabetes mellitus, stage III chronic kidney disease, seizure disorder, hypertension, previous CVA who presents to the ED with acute onset of expressive aphasia and right gaze deviation and left-sided weakness. MRI confirmed small acute infarct. Neurology consulted by admitting physician.   6/17: Notified by bedside RN that NIH score had increased.  Evaluated patient at bedside.  She seems to be flattened in affect.  Case discussed with neurology.  They feel patient is at neurologic baseline.  Will order head CT.  Echocardiogram completed and pending.   Assessment & Plan:   Principal Problem:   Acute CVA (cerebrovascular accident) [facility]) Active Problems:   Type II diabetes mellitus with renal manifestations (HCC)   Essential hypertension   Seizure disorder (HCC)   Dyslipidemia   Expressive aphasia  Acute CVA (cerebrovascular accident) (HCC) This is manifested by aphasia and left-sided hemiparesis. Confirmed on MRI Neurology following Plan: Dual antiplatelet therapy Recheck CT head given concern for worsening neurologic status 2D echocardiogram with bubble study High intensity statin Permissive hypertension window has expired.  Normotensive blood pressure goal  Type II diabetes mellitus with renal manifestations (HCC) Basal bolus regimen Carb modified diet   Dyslipidemia High intensity statin   Seizure disorder (HCC) PTA Keppra Check EEG   Essential hypertension Normotensive blood pressure PTA Toprol and Norvasc  DVT prophylaxis: Lovenox Code Status: DNR Family Communication: None today Disposition Plan: Status is: Observation The patient will require care spanning > 2 midnights and should be moved to inpatient because: Stroke workup in progress   Level of care:  Telemetry Medical  Consultants:  Neurology  Procedures:  None  Antimicrobials: None   Subjective: Patient seen and examined.  Flattened affect this morning.  Neurologic status appears to be worse.  Objective: Vitals:   02/26/23 0105 02/26/23 0422 02/26/23 0820 02/26/23 0922  BP: 114/64 (!) 122/59 (!) 124/56 132/74  Pulse: 71 72 70 73  Resp: 16 16 16 16   Temp: 97.9 F (36.6 C) 97.8 F (36.6 C) 98.4 F (36.9 C) 98 F (36.7 C)  TempSrc: Oral Oral  Oral  SpO2: 100% 98% 100% 98%  Weight:      Height:        Intake/Output Summary (Last 24 hours) at 02/26/2023 1044 Last data filed at 02/26/2023 1033 Gross per 24 hour  Intake --  Output 1300 ml  Net -1300 ml   Filed Weights   02/25/23 2024  Weight: 63.5 kg    Examination:  General exam: NAD.  Flattened affect Respiratory system: Poor respiratory effort normal work of breathing.  Room air Cardiovascular system: S1-2, RRR, no murmurs, no pedal edema Gastrointestinal system: Soft, NT/ND, normal bowel sounds Central nervous system: Alert.  Unable to assess orientation Extremities: Decreased power symmetrically Skin: No rashes, lesions or ulcers Psychiatry: Unable to assess    Data Reviewed: I have personally reviewed following labs and imaging studies  CBC: Recent Labs  Lab 02/24/23 0315 02/25/23 0145 02/25/23 0959  WBC 6.3 7.6 7.2  NEUTROABS 4.3 5.4  --   HGB 11.4* 12.4 9.6*  HCT 33.8* 36.3 29.0*  MCV 86.9 90.1 87.3  PLT 270 210 236   Basic Metabolic Panel: Recent Labs  Lab 02/24/23 0315 02/25/23 0145 02/25/23 0530  NA 134* 136 133*  K 3.4* 3.5 3.7  CL 101 102 103  CO2  22 19* 18*  GLUCOSE 212* 249* 250*  BUN 26* 22 22  CREATININE 1.63* 1.93* 1.76*  CALCIUM 8.6* 8.7* 8.2*   GFR: Estimated Creatinine Clearance: 28.7 mL/min (A) (by C-G formula based on SCr of 1.76 mg/dL (H)). Liver Function Tests: Recent Labs  Lab 02/24/23 0315 02/25/23 0145  AST 27 30  ALT 15 15  ALKPHOS 78 76  BILITOT  1.0 0.9  PROT 8.0 8.3*  ALBUMIN 3.8 3.8   Recent Labs  Lab 02/24/23 0315  LIPASE 24   No results for input(s): "AMMONIA" in the last 168 hours. Coagulation Profile: Recent Labs  Lab 02/25/23 0145 02/25/23 0406  INR SPECIMEN CLOTTED 1.0   Cardiac Enzymes: No results for input(s): "CKTOTAL", "CKMB", "CKMBINDEX", "TROPONINI" in the last 168 hours. BNP (last 3 results) No results for input(s): "PROBNP" in the last 8760 hours. HbA1C: No results for input(s): "HGBA1C" in the last 72 hours. CBG: Recent Labs  Lab 02/25/23 1230 02/25/23 1643 02/25/23 2040 02/26/23 0419 02/26/23 0822  GLUCAP 84 139* 263* 66* 91   Lipid Profile: Recent Labs    02/25/23 0530  CHOL 241*  HDL 88  LDLCALC 141*  TRIG 60  CHOLHDL 2.7   Thyroid Function Tests: No results for input(s): "TSH", "T4TOTAL", "FREET4", "T3FREE", "THYROIDAB" in the last 72 hours. Anemia Panel: No results for input(s): "VITAMINB12", "FOLATE", "FERRITIN", "TIBC", "IRON", "RETICCTPCT" in the last 72 hours. Sepsis Labs: Recent Labs  Lab 02/24/23 0315  LATICACIDVEN 1.6    No results found for this or any previous visit (from the past 240 hour(s)).       Radiology Studies: MR BRAIN WO CONTRAST  Result Date: 02/25/2023 CLINICAL DATA:  62 year old female with code stroke presentation, rightward gaze. Chronic ACA and PCA atherosclerosis and stenosis on CTA. EXAM: MRI HEAD WITHOUT CONTRAST TECHNIQUE: Multiplanar, multiecho pulse sequences of the brain and surrounding structures were obtained without intravenous contrast. COMPARISON:  CTA head and neck 0230 hours today. Brain MRI 01/01/2023. FINDINGS: Brain: Ongoing right posterosuperior frontal lobe subcortical white matter abnormal diffusion, slightly extended from similar abnormality on 01/01/2023 MRI (series 9, image 33). And contralateral posterior left frontal lobe subcortical white matter infarct with restricted diffusion near the same level is new. No other  convincing acutely abnormal diffusion. Chronic infarcts and encephalomalacia in the bilateral PCA territories, right thalamus and left deep gray nuclei, posterior right MCA territory at the parietal lobe, to a lesser extent brainstem. Chronic microhemorrhage in the central pons. Additional brainstem Wallerian degeneration. Hemosiderin and/or laminar necrosis throughout much of the left PCA territory. Confluent additional cerebral white matter T2 and FLAIR hyperintensity. No midline shift, mass effect, evidence of mass lesion, acute ventriculomegaly, extra-axial collection or acute intracranial hemorrhage. Cervicomedullary junction and pituitary are within normal limits. Vascular: Major intracranial vascular flow voids are stable when allowing for motion degraded axial T2 imaging today. Skull and upper cervical spine: Motion degraded upper cervical spine on sagittal T1 today. Visualized bone marrow signal is within normal limits. Sinuses/Orbits: Ongoing rightward gaze. Paranasal Visualized paranasal sinuses and mastoids are stable and well aerated. Other: Negative visible scalp and face. IMPRESSION: 1. Small acute posterior left frontal lobe subcortical white matter infarct, near the left centrum semiovale. And contralateral posterior right frontal lobe white matter infarct which appears recurrent for extended from similar ischemia in April. No associated hemorrhage or mass effect. 2. Otherwise stable advanced chronic ischemic changes in the brain, disproportionately affecting the PCA territories. Electronically Signed   By: Althea Grimmer.D.  On: 02/25/2023 06:06   CT ANGIO HEAD NECK W WO CM W PERF (CODE STROKE)  Result Date: 02/25/2023 CLINICAL DATA:  Right-sided gaze deviation EXAM: CT ANGIOGRAPHY HEAD AND NECK CT PERFUSION BRAIN TECHNIQUE: Multidetector CT imaging of the head and neck was performed using the standard protocol during bolus administration of intravenous contrast. Multiplanar CT image  reconstructions and MIPs were obtained to evaluate the vascular anatomy. Carotid stenosis measurements (when applicable) are obtained utilizing NASCET criteria, using the distal internal carotid diameter as the denominator. Multiphase CT imaging of the brain was performed following IV bolus contrast injection. Subsequent parametric perfusion maps were calculated using RAPID software. RADIATION DOSE REDUCTION: This exam was performed according to the departmental dose-optimization program which includes automated exposure control, adjustment of the mA and/or kV according to patient size and/or use of iterative reconstruction technique. CONTRAST:  OMNIPAQUE IOHEXOL 350 MG/ML SOLN COMPARISON:  CT head 02/25/2023, correlation is also made with CTA head and neck 01/01/2023 FINDINGS: CT HEAD FINDINGS For noncontrast findings, please see same day CT head. CTA NECK FINDINGS Aortic arch: Standard branching. Imaged portion shows no evidence of aneurysm or dissection. No significant stenosis of the major arch vessel origins. Right carotid system: No evidence of dissection, occlusion, or hemodynamically significant stenosis (greater than 50%). Left carotid system: No evidence of dissection, occlusion, or hemodynamically significant stenosis (greater than 50%). Vertebral arteries: No evidence of dissection, occlusion, or hemodynamically significant stenosis (greater than 50%). Skeleton: No acute osseous abnormality. Degenerative changes in the cervical spine. Other neck: Hypoenhancing nodules in the thyroid, the largest of which measures up to 9 mm, for which no follow-up is currently indicated. (Reference: J Am Coll Radiol. 2015 Feb;12(2): 143-50) Upper chest: No focal pulmonary opacity or pleural effusion. Review of the MIP images confirms the above findings CTA HEAD FINDINGS Anterior circulation: Both internal carotid arteries are patent to the termini, with calcifications but without significant stenosis. Patent right  A1. Aplastic left A1. Normal anterior communicating artery. Redemonstrated short segment occlusion of left A2 (series 7, image 108), with additional areas of focal moderate stenosis more distally (series 7, image 96), unchanged. Anterior cerebral arteries are otherwise patent to their distal aspects without significant stenosis. Severe stenosis in the distal M1 segments (series 7, images 114 and 118). Additional multifocal severe stenosis throughout the bilateral MCA branches, similar to prior. Posterior circulation: Vertebral arteries patent to the vertebrobasilar junction without significant stenosis. Posterior inferior cerebellar arteries patent proximally. Basilar patent to its distal aspect without significant stenosis. Superior cerebellar arteries patent proximally. Redemonstrated occlusion of the left PCA. Redemonstrated multifocal moderate to severe stenosis in the right PCA branches. Venous sinuses: Not well opacified due to arterial timing. Anatomic variants: None significant. Review of the MIP images confirms the above findings CT Brain Perfusion Findings: ASPECTS: 10 CBF (<30%) Volume: 9mL Perfusion (Tmax>6.0s) volume: 20mL Mismatch Volume: 11mL Infarction Location:The infarct core is in the left parietal lobe, with the penumbra in the left parietal and left frontal lobe. Some of this may correlate with encephalomalacia in the left PCA territory. The left frontal lobe penumbra may be artifactual, given motion. IMPRESSION: 1. No new intracranial large vessel occlusion. Redemonstrated short segment occlusion of the left A2, with additional areas of focal moderate stenosis more distally in the ACAs. 2. Redemonstrated occlusion of the left PCA with multifocal moderate to severe stenosis in the right PCA. 3. Multifocal severe stenosis throughout the bilateral MCA branches. 4. No hemodynamically significant stenosis in the neck. 5. Perfusion imaging  demonstrates an infarct core in the left parietal lobe, with  the number in the left parietal and left frontal lobe. The left parietal findings likely correlate with the chronic encephalomalacia in the left PCA territory. In the inferior left frontal lobe penumbra may be artifactual, secondary to motion. Electronically Signed   By: Wiliam Ke M.D.   On: 02/25/2023 03:07   DG Chest Portable 1 View  Result Date: 02/25/2023 CLINICAL DATA:  Status post central line placement EXAM: PORTABLE CHEST 1 VIEW COMPARISON:  01/01/2019 FINDINGS: Cardiac shadow is within normal limits. Lungs are well aerated bilaterally. Right jugular central line is noted with the tip at the inferior cavoatrial junction. This should be withdrawn several cm. No pneumothorax is seen. No bony abnormality is noted. IMPRESSION: Status post central line placement without evidence of pneumothorax. Catheter should be withdrawn several cm. Electronically Signed   By: Alcide Clever M.D.   On: 02/25/2023 02:27   CT HEAD CODE STROKE WO CONTRAST  Result Date: 02/25/2023 CLINICAL DATA:  Code stroke.  Stroke suspected, nonverbal EXAM: CT HEAD WITHOUT CONTRAST TECHNIQUE: Contiguous axial images were obtained from the base of the skull through the vertex without intravenous contrast. RADIATION DOSE REDUCTION: This exam was performed according to the departmental dose-optimization program which includes automated exposure control, adjustment of the mA and/or kV according to patient size and/or use of iterative reconstruction technique. COMPARISON:  01/21/2023 FINDINGS: Brain: No evidence of acute infarction, hemorrhage, mass, mass effect, or midline shift. No hydrocephalus or extra-axial collection. Redemonstrated remote infarct involving the left PCA territory. Periventricular white matter changes, likely the sequela of chronic small vessel ischemic disease. Vascular: No hyperdense vessel. Atherosclerotic calcifications in the intracranial carotid and vertebral arteries. Skull: Negative for fracture or focal  lesion. Sinuses/Orbits: Small right maxillary mucous retention cyst. Otherwise clear paranasal sinuses. No acute finding in the orbits. Other: The mastoid air cells are well aerated. ASPECTS [facility] Stroke Program Early CT Score) - Ganglionic level infarction (caudate, lentiform nuclei, internal capsule, insula, M1-M3 cortex): 7 - Supraganglionic infarction (M4-M6 cortex): 3 Total score (0-10 with 10 being normal): 10 IMPRESSION: 1. No acute intracranial process. ASPECTS is 10. 2. Remote left PCA territory infarct. Code stroke imaging results were communicated on 02/25/2023 at 12:48 am to provider [facility] via telephone, who verbally acknowledged these results. Electronically Signed   By: Wiliam Ke M.D.   On: 02/25/2023 00:49        Scheduled Meds:  amLODipine  10 mg Oral Daily   B-complex with vitamin C  1 tablet Oral Daily   Chlorhexidine Gluconate Cloth  6 each Topical Daily   clopidogrel  75 mg Oral Daily   enoxaparin (LOVENOX) injection  30 mg Subcutaneous Q24H   insulin aspart  0-15 Units Subcutaneous TID WC   insulin aspart  0-5 Units Subcutaneous QHS   insulin glargine-yfgn  10 Units Subcutaneous QHS   levETIRAcetam  250 mg Oral BID   metoprolol succinate  25 mg Oral Daily   polyethylene glycol  17 g Oral Daily   rosuvastatin  20 mg Oral QHS   senna-docusate  2 tablet Oral QHS   Vitamin D (Ergocalciferol)  50,000 Units Oral Q7 days   Continuous Infusions:  0.9 % NaCl with KCl 20 mEq / L 100 mL/hr at 02/26/23 1037     LOS: 0 days    Tresa Moore, MD [facility]   If 7PM-7AM, please contact night-coverage  02/26/2023, 10:44 AM

## 2023-02-26 NOTE — Progress Notes (Signed)
Neurology progress note  S: RN notified me this AM that her NIHSS had increased from 8-9 overnight to 17 with pronounced lethargy and difficulty lifting RUE and BLE. I evaluated the patient and my examination was unchanged from Dr. Shelbie Hutching exam yesterday. RN said she appeared more awake when we reassessed her. No witnessed motor seizure activity. STAT head CT showed no new findings.  O:  Vitals:   02/26/23 0922 02/26/23 1205  BP: 132/74 136/81  Pulse: 73 71  Resp: 16 15  Temp: 98 F (36.7 C) 98.2 F (36.8 C)  SpO2: 98% 100%   Physical Exam HEENT- [place]/AT   Lungs- Respirations unlabored Extremities- Warm and well-perfused   Neurological Examination Mental Status: Awake and alert. Bradyphrenic with sparse speech. Increased latencies of verbal and motor responses. Frequent verbal perseveration. Poorly oriented. Mild dysarthria. Able to follow basic commands and answer basic questions. Left sided neglect.  Cranial Nerves: II: Difficult to assess visual fields due to cognitive impairment. Only sees two fingers when five are held in front of her at the center of her field of view. PERRL. III,IV, VI: Right sided gaze preference. Has difficulty crossing the midline to the left.  V: Temp sensation decreased on the right.  VII: Smile symmetric VIII: Hearing intact to voice IX,X: No hypophonia or hoarseness XI: Head preferentially rotated to the right.  XII: Midline tongue extension Motor: RUE: Flexion contractures to elbow, wrist and digits. Arm tonically adducted. Severely increased flexor tone. Able to move hand and forearm with 3/5 strength and decreased ROM LUE: 3-4/5 proximally and distally with increased tone.  RLE: 1-2/5 hip flexion. Severely increased extensor tone at knee but unable to resist examiner. Minimal movement of right foot/toes.  LLE: 2-3/5 hip flexion and knee extension. Slight movement of foot. Moderately to severely increased tone. Sensory: Decreased sensation to RUE  and RLE Deep Tendon Reflexes: Brisk low amplitude reflexes throughout. Toes equivocal bilaterally Cerebellar: Unable to assess Gait: Unable to assess  Data  Carotid ultrasound (10/16/22):  Right Carotid: The extracranial vessels were near-normal with only minimal  wall thickening or plaque.  Left Carotid: The extracranial vessels were near-normal with only minimal  wall thickening or plaque.  Vertebrals:  Bilateral vertebral arteries demonstrate antegrade flow.  Subclavians: Normal flow hemodynamics were seen in bilateral subclavian arteries.    TTE (10/15/22): 1. Left ventricular ejection fraction, by estimation, is 60 to 65%. The  left ventricle has normal function. The left ventricle has no regional  wall motion abnormalities. Left ventricular diastolic parameters are  consistent with Grade I diastolic dysfunction (impaired relaxation).   2. Right ventricular systolic function is normal. The right ventricular  size is not well visualized. Tricuspid regurgitation signal is inadequate  for assessing PA pressure.   3. No evidence of mitral valve regurgitation.   4. The aortic valve is tricuspid. Aortic valve regurgitation is not  visualized.   5. The inferior vena cava is normal in size with greater than 50%  respiratory variability, suggesting right atrial pressure of 3 mmHg.   Assessment: 62 y.o. 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 LKN 6/15 at 2000. CBG on  arrival was 71 in the context of her CBGs usually being in the upper 200's. An amp of D50 was given. BP on presentation was 146/112. Teleneurology was consulted. NIHSS was 27. CT head  revealed remote left PCA territory infarction with no acute intracranial abnormalities. It was felt that her hypoglycemia did not entirely explain her presentation. Teleneurology consultant noted that in Feb of this year MRI brain showed a focus of hemorrhage in the central pons. She has known multifocal regions of high grade stenosis in the left MCA and left ACA. She has an occluded PCA as cause of a prior CVA. She was not a candidate for IV TNK due to history of ICH. She was not a candidate for IR due to mRS of 4.   - Exam unchanged from Dr. Shelbie Hutching yesterday. - CT head: No acute intracranial process. ASPECTS is 10. Remote large left PCA territory infarct. - MRI brain: Small acute posterior left frontal lobe subcortical white matter infarct, near the left centrum semiovale. Contralateral posterior right frontal lobe white matter infarct which appears recurrent or extended from similar ischemia in April. No associated hemorrhage or mass effect. Otherwise stable advanced chronic ischemic changes in the brain, disproportionately affecting the PCA territories, with large chronic left occipitotemporal ischemic infarct noted.  - EKG showed sinus rhythm with a rate of 88 with right axis deviation and prolonged QT interval with QTc 518 MS.  - CTA of head and neck: No new intracranial large vessel occlusion. Redemonstrated short segment occlusion of the left A2, with additional areas of focal moderate stenosis more distally in the ACAs. Redemonstrated occlusion of the left PCA with multifocal moderate to severe stenosis in the right PCA. Multifocal severe stenosis throughout the bilateral MCA branches. No hemodynamically significant stenosis in the neck.  - CTP: Perfusion imaging demonstrates an infarct core in the left parietal lobe, with the number in the left parietal and left frontal lobe. The left parietal findings likely correlate with the chronic encephalomalacia in the left PCA territory. Inferior left  frontal lobe penumbra may be artifactual, secondary to motion. - Elevated cholesterol on labs. Low eGFR.  - DDx for mechanism of her new DWI abnormalities: Possible watershed strokes are a consideration due to possible low BP at home given her severe intracranial atherosclerotic disease. Severe hypoglycemia is a consideration due to the striking symmetry of the DWI findings.    Recommendations:  # Acute ischemic stroke - Has had a recent TTE in February. No indication for repeating at this time.  - Cardiac telemetry - HgbA1c, fasting lipid panel - PT consult, OT consult, Speech consult - Continue rosuvastatin - Add ASA to Plavix, as she has failed Plavix monotherapy - Glycemic control. Avoid hypoglycemia and hyperglycemia.  - Gentle hydration - Frequent neuro checks - NPO until passes stroke swallow screen - BP management with permissive HTN for the first 24 hours since symptom onset  # Episode of lethargy, confusion, and difficulty following commands 6/17 AM # Seizure disorder - Continue Keppra - rEEG  Will continue to follow.  Bing Neighbors, MD [facility] [phone]  If 7pm- 7am, please page neurology on call as listed in AMION.

## 2023-02-26 NOTE — Progress Notes (Signed)
PT Cancellation Note  Patient Details Name: [name] Gilbert MRN: [id number] DOB: 01-26-1961   Cancelled Treatment:     PT off floor at EEG. Will re-attempt in pm if time permits or continue skilled PT services next date.    Jannet Askew 02/26/2023, 1:16 PM

## 2023-02-26 NOTE — Inpatient Diabetes Management (Signed)
Inpatient Diabetes Program Recommendations  AACE/ADA: New Consensus Statement on Inpatient Glycemic Control (2015)  Target Ranges:  Prepandial:   less than 140 mg/dL      Peak postprandial:   less than 180 mg/dL (1-2 hours)      Critically ill patients:  140 - 180 mg/dL   Lab Results  Component Value Date   GLUCAP 116 (H) 02/26/2023   HGBA1C 8.7 (H) 01/01/2023    Latest Reference Range & Units 02/25/23 07:21 02/25/23 12:30 02/25/23 16:43 02/25/23 20:40 02/26/23 04:19 02/26/23 08:22 02/26/23 12:06  Glucose-Capillary 70 - 99 mg/dL 409 (H) Novolog 5 units 84 139 (H) 263 (H) Novolog 3 units 66 (L) 91 116 (H)  (H): Data is abnormally high (L): Data is abnormally low  Diabetes history: DM2 Outpatient Diabetes medications: Tresiba 10 units at bedtime, Novolog 5 units tid meal coverage Current orders for Inpatient glycemic control: Semglee 10 units, Novolog 0-15 units tid, 0-5 hs  Inpatient Diabetes Program Recommendations:   Noted hypoglycemia post correction. Please consider: -Decrease Novolog correction to 0-9 units tid, 0-5 units hs  Thank you, Darel Hong E. Chailyn Racette, RN, MSN, CDE  Diabetes Coordinator Inpatient Glycemic Control Team Team Pager [phone] (8am-5pm) 02/26/2023 1:56 PM

## 2023-02-26 NOTE — Progress Notes (Signed)
*  PRELIMINARY RESULTS* Echocardiogram 2D Echocardiogram has been performed.  [name] Gilbert 02/26/2023, 9:41 AM

## 2023-02-27 DIAGNOSIS — I639 Cerebral infarction, unspecified: Secondary | ICD-10-CM | POA: Diagnosis not present

## 2023-02-27 LAB — CBC WITH DIFFERENTIAL/PLATELET
Abs Immature Granulocytes: 0.01 10*3/uL (ref 0.00–0.07)
Basophils Absolute: 0 10*3/uL (ref 0.0–0.1)
Basophils Relative: 0 %
Eosinophils Absolute: 0.1 10*3/uL (ref 0.0–0.5)
Eosinophils Relative: 3 %
HCT: 26.4 % — ABNORMAL LOW (ref 36.0–46.0)
Hemoglobin: 8.7 g/dL — ABNORMAL LOW (ref 12.0–15.0)
Immature Granulocytes: 0 %
Lymphocytes Relative: 40 %
Lymphs Abs: 1.9 10*3/uL (ref 0.7–4.0)
MCH: 28.9 pg (ref 26.0–34.0)
MCHC: 33 g/dL (ref 30.0–36.0)
MCV: 87.7 fL (ref 80.0–100.0)
Monocytes Absolute: 0.4 10*3/uL (ref 0.1–1.0)
Monocytes Relative: 9 %
Neutro Abs: 2.2 10*3/uL (ref 1.7–7.7)
Neutrophils Relative %: 48 %
Platelets: 196 10*3/uL (ref 150–400)
RBC: 3.01 MIL/uL — ABNORMAL LOW (ref 3.87–5.11)
RDW: 15.2 % (ref 11.5–15.5)
WBC: 4.7 10*3/uL (ref 4.0–10.5)
nRBC: 0 % (ref 0.0–0.2)

## 2023-02-27 LAB — GLUCOSE, CAPILLARY
Glucose-Capillary: 131 mg/dL — ABNORMAL HIGH (ref 70–99)
Glucose-Capillary: 87 mg/dL (ref 70–99)
Glucose-Capillary: 229 mg/dL — ABNORMAL HIGH (ref 70–99)

## 2023-02-27 LAB — BASIC METABOLIC PANEL
Anion gap: 4 — ABNORMAL LOW (ref 5–15)
BUN: 18 mg/dL (ref 8–23)
CO2: 21 mmol/L — ABNORMAL LOW (ref 22–32)
Calcium: 7.9 mg/dL — ABNORMAL LOW (ref 8.9–10.3)
Chloride: 115 mmol/L — ABNORMAL HIGH (ref 98–111)
Creatinine, Ser: 2.16 mg/dL — ABNORMAL HIGH (ref 0.44–1.00)
GFR, Estimated: 25 mL/min — ABNORMAL LOW (ref >60)
Glucose, Bld: 107 mg/dL — ABNORMAL HIGH (ref 70–99)
Potassium: 4 mmol/L (ref 3.5–5.1)
Sodium: 140 mmol/L (ref 135–145)

## 2023-02-27 MED ORDER — ENSURE ENLIVE PO LIQD
237.0000 mL | Freq: Two times a day (BID) | ORAL | Status: DC
  Administered 2023-02-27 – 2023-03-12 (×19): 237 mL via ORAL

## 2023-02-27 MED ORDER — ASPIRIN 81 MG PO TBEC
81.0000 mg | DELAYED_RELEASE_TABLET | Freq: Every day | ORAL | Status: DC
  Administered 2023-02-27 – 2023-03-12 (×14): 81 mg via ORAL
  Filled 2023-02-27 (×14): qty 1

## 2023-02-27 MED ORDER — EZETIMIBE 10 MG PO TABS
10.0000 mg | ORAL_TABLET | Freq: Every day | ORAL | Status: DC
  Administered 2023-02-27 – 2023-03-12 (×14): 10 mg via ORAL
  Filled 2023-02-27 (×14): qty 1

## 2023-02-27 NOTE — Progress Notes (Signed)
Occupational Therapy Treatment Patient Details Name: [name] Gilbert MRN: [id number] DOB: 10-14-1960 Today's Date: 02/27/2023   History of present illness Pt is a 62 y/o F admitted on 02/25/23 after presenting with acut onset of expressive aphasia & R gaze preference with L sided weakness. Pt is being treated for acute CVA. PMH: DM2, CKD 3B, seizure disorder, HTN, CVA   OT comments  Upon entering the room, pt supine in bed and see for skilled co-treatment with PTA. Pt with flat affect but agreeable to therapeutic intervention. Pt needing +2 assistance to come to EOB. Min - mod A for static sitting balance initially but then assisted with hand positioning and maintained midline with close supervision - CGA for several minutes. Pt's bed noted to be saturated with urine. Pt stands with Mod +2 assistance and second person assists with hygiene while the other assists with static standing balance. Mod of 2 to take several steps to the L towards recliner chair with RW. Pt needing physical assistance for weight shift and to advance L LE. Chair alarm activated and call bell within reach upon exiting the room.    Recommendations for follow up therapy are one component of a multi-disciplinary discharge planning process, led by the attending physician.  Recommendations may be updated based on patient status, additional functional criteria and insurance authorization.    Assistance Recommended at Discharge Frequent or constant Supervision/Assistance  Patient can return home with the following  Two people to help with walking and/or transfers;Two people to help with bathing/dressing/bathroom;Assistance with cooking/housework;Assistance with feeding;Direct supervision/assist for medications management;Direct supervision/assist for financial management;Assist for transportation;Help with stairs or ramp for entrance   Equipment Recommendations  Other (comment) (defer to next venue of care)       Precautions /  Restrictions Precautions Precautions: Fall Precaution Comments: hx R hemi       Mobility Bed Mobility Overal bed mobility: Needs Assistance Bed Mobility: Supine to Sit     Supine to sit: Mod assist, +2 for physical assistance          Transfers Overall transfer level: Needs assistance Equipment used: 2 person hand held assist, Rolling walker (2 wheels) Transfers: Sit to/from Stand Sit to Stand: Mod assist, +2 physical assistance           General transfer comment: total A to don B socks     Balance Overall balance assessment: Needs assistance Sitting-balance support: Feet supported, Feet unsupported, Bilateral upper extremity supported Sitting balance-Leahy Scale: Fair     Standing balance support: During functional activity, Bilateral upper extremity supported, Reliant on assistive device for balance Standing balance-Leahy Scale: Poor Standing balance comment: +2 assistance with RW                           ADL either performed or assessed with clinical judgement      Cognition Arousal/Alertness: Awake/alert Behavior During Therapy: Flat affect Overall Cognitive Status: No family/caregiver present to determine baseline cognitive functioning                                                     Pertinent Vitals/ Pain       Pain Assessment Pain Assessment: No/denies pain         Frequency  Min 2X/week  Progress Toward Goals  OT Goals(current goals can now be found in the care plan section)  Progress towards OT goals: Progressing toward goals     Plan Discharge plan remains appropriate;Frequency needs to be updated    Co-evaluation      Reason for Co-Treatment: For patient/therapist safety;Necessary to address cognition/behavior during functional activity;Complexity of the patient's impairments (multi-system involvement) PT goals addressed during session: Balance;Mobility/safety with mobility OT goals  addressed during session: ADL's and self-care;Strengthening/ROM      AM-PAC OT "6 Clicks" Daily Activity     Outcome Measure   Help from another person eating meals?: A Lot Help from another person taking care of personal grooming?: Total Help from another person toileting, which includes using toliet, bedpan, or urinal?: Total Help from another person bathing (including washing, rinsing, drying)?: Total Help from another person to put on and taking off regular upper body clothing?: Total Help from another person to put on and taking off regular lower body clothing?: Total 6 Click Score: 7    End of Session Equipment Utilized During Treatment: Rolling walker (2 wheels)  OT Visit Diagnosis: Muscle weakness (generalized) (M62.81);Hemiplegia and hemiparesis   Activity Tolerance Patient tolerated treatment well   Patient Left with call bell/phone within reach;in chair;with chair alarm set   Nurse Communication Mobility status        Time: 1131-1156 OT Time Calculation (min): 25 min  Charges: OT General Charges $OT Visit: 1 Visit OT Treatments $Neuromuscular Re-education: 8-22 mins  Jackquline Denmark, MS, OTR/L , CBIS ascom [phone]  02/27/23, 3:12 PM

## 2023-02-27 NOTE — Progress Notes (Signed)
PROGRESS NOTE    [name] Gilbert  ZOX:[id number] DOB: 1961-06-28 DOA: 02/25/2023 PCP: Tobey Grim, MD    Brief Narrative:  62 year old African-American female history significant for type 2 diabetes mellitus, stage III chronic kidney disease, seizure disorder, hypertension, previous CVA who presents to the ED with acute onset of expressive aphasia and right gaze deviation and left-sided weakness. MRI confirmed small acute infarct. Neurology consulted by admitting physician.   6/17: Notified by bedside RN that NIH score had increased.  Evaluated patient at bedside.  She seems to be flattened in affect.  Case discussed with neurology.  They feel patient is at neurologic baseline.  Will order head CT.  Echocardiogram completed and pending.  6/18: Repeat head CT without new issues.  Echocardiogram reassuring.  Bubble study negative.   Assessment & Plan:   Principal Problem:   Acute CVA (cerebrovascular accident) [facility]) Active Problems:   Type II diabetes mellitus with renal manifestations (HCC)   Essential hypertension   Seizure disorder (HCC)   Dyslipidemia   Expressive aphasia  Acute CVA (cerebrovascular accident) (HCC) This is manifested by aphasia and left-sided hemiparesis. Confirmed on MRI Neurology following Plan: Dual antiplatelet therapy High intensity statin Normotensive blood pressure goal Medically stable for discharge.  Will need skilled nursing facility  Type II diabetes mellitus with renal manifestations (HCC) Basal bolus regimen Carb modified diet   Dyslipidemia High intensity statin   Seizure disorder (HCC) PTA Keppra Check EEG-negative   Essential hypertension Normotensive blood pressure PTA Toprol and Norvasc  DVT prophylaxis: Lovenox Code Status: DNR Family Communication: None today Disposition Plan: Status is: Inpatient Remains inpatient appropriate because: Unsafe discharge plan.  Patient medically appropriate for discharge to skilled  nursing facility.     Level of care: Telemetry Medical  Consultants:  Neurology  Procedures:  None  Antimicrobials: None   Subjective: Seen and examined.  Neurologic status appears at baseline.  Flattened affect.  Objective: Vitals:   02/26/23 1627 02/26/23 2033 02/26/23 2358 02/27/23 0719  BP: 108/70 (!) 117/55 139/65 128/70  Pulse: 83 79 77 73  Resp: 18 20 20 18   Temp: 98.3 F (36.8 C) 98.4 F (36.9 C) 98.3 F (36.8 C) 98 F (36.7 C)  TempSrc: Oral   Oral  SpO2: 100% 100% 99% 99%  Weight:      Height:        Intake/Output Summary (Last 24 hours) at 02/27/2023 1121 Last data filed at 02/27/2023 1000 Gross per 24 hour  Intake 1439.45 ml  Output 400 ml  Net 1039.45 ml   Filed Weights   02/25/23 2024  Weight: 63.5 kg    Examination:  General exam: NAD.  Flattened affect Respiratory system: Poor respiratory effort.  Normal work of breathing.  Room air Cardiovascular system: S1-2, RRR, no murmurs, no pedal edema Gastrointestinal system: Soft, NT/ND, normal bowel sounds Central nervous system: Alert.  Unable to assess orientation Extremities: Decreased power symmetrically Skin: No rashes, lesions or ulcers Psychiatry: Unable to assess    Data Reviewed: I have personally reviewed following labs and imaging studies  CBC: Recent Labs  Lab 02/24/23 0315 02/25/23 0145 02/25/23 0959  WBC 6.3 7.6 7.2  NEUTROABS 4.3 5.4  --   HGB 11.4* 12.4 9.6*  HCT 33.8* 36.3 29.0*  MCV 86.9 90.1 87.3  PLT 270 210 236   Basic Metabolic Panel: Recent Labs  Lab 02/24/23 0315 02/25/23 0145 02/25/23 0530 02/26/23 1500  NA 134* 136 133* 133*  K 3.4* 3.5 3.7 4.7  CL  101 102 103 108  CO2 22 19* 18* 19*  GLUCOSE 212* 249* 250* 330*  BUN 26* 22 22 16   CREATININE 1.63* 1.93* 1.76* 1.95*  CALCIUM 8.6* 8.7* 8.2* 7.6*   GFR: Estimated Creatinine Clearance: 25.9 mL/min (A) (by C-G formula based on SCr of 1.95 mg/dL (H)). Liver Function Tests: Recent Labs  Lab  02/24/23 0315 02/25/23 0145  AST 27 30  ALT 15 15  ALKPHOS 78 76  BILITOT 1.0 0.9  PROT 8.0 8.3*  ALBUMIN 3.8 3.8   Recent Labs  Lab 02/24/23 0315  LIPASE 24   No results for input(s): "AMMONIA" in the last 168 hours. Coagulation Profile: Recent Labs  Lab 02/25/23 0145 02/25/23 0406  INR SPECIMEN CLOTTED 1.0   Cardiac Enzymes: No results for input(s): "CKTOTAL", "CKMB", "CKMBINDEX", "TROPONINI" in the last 168 hours. BNP (last 3 results) No results for input(s): "PROBNP" in the last 8760 hours. HbA1C: No results for input(s): "HGBA1C" in the last 72 hours. CBG: Recent Labs  Lab 02/26/23 0822 02/26/23 1206 02/26/23 1657 02/26/23 2352 02/27/23 0831  GLUCAP 91 116* 277* 197* 131*   Lipid Profile: Recent Labs    02/25/23 0530  CHOL 241*  HDL 88  LDLCALC 141*  TRIG 60  CHOLHDL 2.7   Thyroid Function Tests: No results for input(s): "TSH", "T4TOTAL", "FREET4", "T3FREE", "THYROIDAB" in the last 72 hours. Anemia Panel: No results for input(s): "VITAMINB12", "FOLATE", "FERRITIN", "TIBC", "IRON", "RETICCTPCT" in the last 72 hours. Sepsis Labs: Recent Labs  Lab 02/24/23 0315  LATICACIDVEN 1.6    No results found for this or any previous visit (from the past 240 hour(s)).       Radiology Studies: DG Chest Port 1 View  Result Date: 02/26/2023 CLINICAL DATA:  Chest pain EXAM: PORTABLE CHEST 1 VIEW COMPARISON:  X-ray 02/24/2023 FINDINGS: Underinflation. No consolidation, pneumothorax or effusion. Normal cardiopericardial silhouette without edema when adjusting for level of inflation. Stable right IJ catheter in place with tip along the lower right atrium. This could be retracted 4-5 cm. IMPRESSION: Underinflation.  No acute cardiopulmonary disease. Once again right IJ line in place with tip along the lower right atrium. This could be retracted 4-5 cm Electronically Signed   By: Karen Kays M.D.   On: 02/26/2023 19:57   EEG adult  Result Date:  02/26/2023 Jefferson Fuel, MD     02/26/2023  3:47 PM Routine EEG Report [name] Gilbert is a 62 y.o. female with a history of altered mental status who is undergoing an EEG to evaluate for seizures. Report: This EEG was acquired with electrodes placed according to the International 10-20 electrode system (including Fp1, Fp2, F3, F4, C3, C4, P3, P4, O1, O2, T3, T4, T5, T6, A1, A2, Fz, Cz, Pz). The following electrodes were missing or displaced: none. The occipital dominant rhythm was 7 Hz. This activity is reactive to stimulation. Drowsiness was manifested by background fragmentation; deeper stages of sleep were identified by K complexes and sleep spindles. There was no focal slowing. There were no interictal epileptiform discharges. There were no electrographic seizures identified. There was no abnormal response to photic stimulation or hyperventilation. Impression and clinical correlation: This EEG was obtained while awake and asleep and is abnormal due to mild diffuse slowing indicative of global cerebral dysfunction. Epileptiform abnormalities were not seen during this recording. Bing Neighbors, MD [facility] [phone] If 7pm- 7am, please page neurology on call as listed in AMION.   ECHOCARDIOGRAM COMPLETE BUBBLE STUDY  Result Date: 02/26/2023  ECHOCARDIOGRAM REPORT   Patient Name:   BRYLI CEASAR Date of Exam: 02/26/2023 Medical Rec #:  [id number]        Height:       62.0 in Accession #:    [id number]       Weight:       140.0 lb Date of Birth:  Jan 30, 1961        BSA:          1.643 m Patient Age:    61 years         BP:           122/59 mmHg Patient Gender: F                HR:           72 bpm. Exam Location:  [facility] Procedure: 2D Echo, Cardiac Doppler, Color Doppler and Saline Contrast Bubble            Study Indications:     Stroke 434.91 / I63.9  History:         Patient has prior history of Echocardiogram examinations, most                  recent 10/15/2022. Stroke; Risk  Factors:Hypertension and                  Diabetes.  Sonographer:     Cristela Blue Referring Phys:  [id number] JAN A MANSY Diagnosing Phys: Yvonne Kendall MD  Sonographer Comments: Suboptimal apical window. IMPRESSIONS  1. Left ventricular ejection fraction, by estimation, is 60 to 65%. The left ventricle has normal function. The left ventricle has no regional wall motion abnormalities. There is mild left ventricular hypertrophy. Left ventricular diastolic parameters are consistent with Grade I diastolic dysfunction (impaired relaxation).  2. Right ventricular systolic function is normal. The right ventricular size is normal. Tricuspid regurgitation signal is inadequate for assessing PA pressure.  3. The mitral valve is normal in structure. Trivial mitral valve regurgitation. No evidence of mitral stenosis.  4. The aortic valve is tricuspid. There is mild calcification of the aortic valve. There is mild thickening of the aortic valve. Aortic valve regurgitation is not visualized. No aortic stenosis is present.  5. Bubble study is probably negative for intracardiac shunt though evaluation is limited by suboptimal acoustic windows. FINDINGS  Left Ventricle: Left ventricular ejection fraction, by estimation, is 60 to 65%. The left ventricle has normal function. The left ventricle has no regional wall motion abnormalities. The left ventricular internal cavity size was normal in size. There is  mild left ventricular hypertrophy. Left ventricular diastolic parameters are consistent with Grade I diastolic dysfunction (impaired relaxation). Right Ventricle: The right ventricular size is normal. No increase in right ventricular wall thickness. Right ventricular systolic function is normal. Tricuspid regurgitation signal is inadequate for assessing PA pressure. Left Atrium: Left atrial size was normal in size. Right Atrium: Right atrial size was normal in size. Pericardium: The pericardium was not well visualized. Mitral Valve:  The mitral valve is normal in structure. Trivial mitral valve regurgitation. No evidence of mitral valve stenosis. MV peak gradient, 3.9 mmHg. The mean mitral valve gradient is 2.0 mmHg. Tricuspid Valve: The tricuspid valve is not well visualized. Tricuspid valve regurgitation is trivial. Aortic Valve: The aortic valve is tricuspid. There is mild calcification of the aortic valve. There is mild thickening of the aortic valve. Aortic valve regurgitation is not visualized. No aortic stenosis is present. Aortic valve mean gradient  measures 3.0 mmHg. Aortic valve peak gradient measures 4.8 mmHg. Aortic valve area, by VTI measures 2.87 cm. Pulmonic Valve: The pulmonic valve was not well visualized. Pulmonic valve regurgitation is not visualized. No evidence of pulmonic stenosis. Aorta: The aortic root is normal in size and structure. Pulmonary Artery: The pulmonary artery is of normal size. Venous: The inferior vena cava was not well visualized. IAS/Shunts: The interatrial septum was not well visualized. Agitated saline contrast was given intravenously to evaluate for intracardiac shunting. Bubble study is probably negative for intracardiac shunt though evaluation is limited by suboptimal acoustic windows.  LEFT VENTRICLE PLAX 2D LVIDd:         3.20 cm   Diastology LVIDs:         2.00 cm   LV e' medial:    7.07 cm/s LV PW:         1.00 cm   LV E/e' medial:  10.6 LV IVS:        1.10 cm   LV e' lateral:   8.92 cm/s LVOT diam:     2.00 cm   LV E/e' lateral: 8.4 LV SV:         69 LV SV Index:   42 LVOT Area:     3.14 cm  RIGHT VENTRICLE RV Basal diam:  3.30 cm RV Mid diam:    2.70 cm RV S prime:     11.60 cm/s TAPSE (M-mode): 2.4 cm LEFT ATRIUM             Index        RIGHT ATRIUM           Index LA diam:        3.20 cm 1.95 cm/m   RA Area:     11.60 cm LA Vol (A2C):   29.9 ml 18.20 ml/m  RA Volume:   29.40 ml  17.90 ml/m LA Vol (A4C):   22.2 ml 13.51 ml/m LA Biplane Vol: 27.6 ml 16.80 ml/m  AORTIC VALVE AV Area  (Vmax):    2.23 cm AV Area (Vmean):   2.40 cm AV Area (VTI):     2.87 cm AV Vmax:           110.00 cm/s AV Vmean:          77.100 cm/s AV VTI:            0.240 m AV Peak Grad:      4.8 mmHg AV Mean Grad:      3.0 mmHg LVOT Vmax:         78.20 cm/s LVOT Vmean:        59.000 cm/s LVOT VTI:          0.219 m LVOT/AV VTI ratio: 0.91  AORTA Ao Root diam: 2.40 cm MITRAL VALVE MV Area (PHT): 3.74 cm    SHUNTS MV Area VTI:   2.75 cm    Systemic VTI:  0.22 m MV Peak grad:  3.9 mmHg    Systemic Diam: 2.00 cm MV Mean grad:  2.0 mmHg MV Vmax:       0.98 m/s MV Vmean:      67.7 cm/s MV Decel Time: 203 msec MV E velocity: 74.90 cm/s MV A velocity: 96.90 cm/s MV E/A ratio:  0.77 Cristal Deer End MD Electronically signed by Yvonne Kendall MD Signature Date/Time: 02/26/2023/1:14:50 PM    Final    CT HEAD WO CONTRAST ( )  Result Date: 02/26/2023 CLINICAL DATA:  62 year old female with code stroke  presentation. Advanced chronic ischemic disease, brain MRI yesterday demonstrating small acute white matter infarcts in both hemispheres. EXAM: CT HEAD WITHOUT CONTRAST TECHNIQUE: Contiguous axial images were obtained from the base of the skull through the vertex without intravenous contrast. RADIATION DOSE REDUCTION: This exam was performed according to the departmental dose-optimization program which includes automated exposure control, adjustment of the mA and/or kV according to patient size and/or use of iterative reconstruction technique. COMPARISON:  Brain MRI, head CT, CTA head and neck 02/25/2023 FINDINGS: Brain: Small white matter infarcts positive on diffusion yesterday occurred in a background of extensive left hemisphere, Patchy and confluent right hemisphere hypodensity and are essentially occult by CT. No acute intracranial hemorrhage identified. No midline shift, mass effect, or evidence of intracranial mass lesion. Superimposed left greater than right PCA, deep gray nuclei encephalomalacia with ex vacuo ventricular  enlargement. No new intracranial abnormality identified. Vascular: Calcified atherosclerosis at the skull base. No suspicious intracranial vascular hyperdensity. Skull: No acute osseous abnormality identified. Sinuses/Orbits: Visualized paranasal sinuses and mastoids are stable and well aerated. Other: No acute orbit or scalp soft tissue finding. IMPRESSION: 1. Advanced chronic ischemic disease. Recent small white matter infarcts on MRI yesterday are occult by CT. No associated hemorrhage or mass effect. 2. No new intracranial abnormality. Electronically Signed   By: Odessa Fleming M.D.   On: 02/26/2023 11:39        Scheduled Meds:  amLODipine  10 mg Oral Daily   B-complex with vitamin C  1 tablet Oral Daily   Chlorhexidine Gluconate Cloth  6 each Topical Daily   clopidogrel  75 mg Oral Daily   enoxaparin (LOVENOX) injection  30 mg Subcutaneous Q24H   insulin aspart  0-5 Units Subcutaneous QHS   insulin aspart  0-9 Units Subcutaneous TID WC   insulin glargine-yfgn  10 Units Subcutaneous QHS   levETIRAcetam  250 mg Oral BID   metoprolol succinate  25 mg Oral Daily   polyethylene glycol  17 g Oral Daily   rosuvastatin  20 mg Oral QHS   senna-docusate  2 tablet Oral QHS   sulfamethoxazole-trimethoprim  1 tablet Oral Q12H   Vitamin D (Ergocalciferol)  50,000 Units Oral Q7 days   Continuous Infusions:  sodium chloride 1,000 mL (02/27/23 0458)     LOS: 1 day    Tresa Moore, MD [facility]   If 7PM-7AM, please contact night-coverage  02/27/2023, 11:21 AM

## 2023-02-27 NOTE — Progress Notes (Signed)
Initial Nutrition Assessment  DOCUMENTATION CODES:   Not applicable  INTERVENTION:   -Liberalize diet to regular for widest variety of meal selections -MVI with minerals daily -Ensure Enlive po BID, each supplement provides 350 kcal and 20 grams of protein -Feeding assistance with meals  NUTRITION DIAGNOSIS:   Inadequate oral intake related to poor appetite as evidenced by meal completion < 50%.  GOAL:   Patient will meet greater than or equal to 90% of their needs  MONITOR:   PO intake, Supplement acceptance  REASON FOR ASSESSMENT:   Low Braden    ASSESSMENT:   Pt with history significant for type 2 diabetes mellitus, stage III chronic kidney disease, seizure disorder, hypertension, previous CVA who presents with acute onset of expressive aphasia and right gaze deviation and left-sided weakness. MRI confirmed small acute infarct.  Pt admitted with acute CVA.   Reviewed I/O's: +940 ml x 24 hours and +40 ml x 24 hours  UOP: 500 ml x 24 hours   Pt very lethargic at time of visit. She did not arouse to voice or touch. No family present to provide additional history.   Pt currently on a heart healthy, carb modified diet. Noted meal completions 0-100%. Pt familiar to RD due top prior hospitalization. Pt typically does not like hospital food and relies on family members to being in outside food.   Reviewed wt hx; pt has experienced a 9.6% wt loss over the past 4 months, which is significant for time frame.   Per TOC notes, plan for SNF placement.   Medications reviewed and include keppra, miralax, senokot, vitamin D, and 0.9% sodium chloride infusion @ 75 ml/hr.    Lab Results  Component Value Date   HGBA1C 8.7 (H) 01/01/2023   PTA DM medications are 10 units trsiba daily and 5 units insulin aspart TID.   Labs reviewed: CBGS: 87 (inpatient orders for glycemic control are 0-5 units insulin aspart daily at bedtime, 0-9 units insulin aspart TID with meals, 10 units  insulin glargine-yfgn daily at bedtime).    NUTRITION - FOCUSED PHYSICAL EXAM:  Flowsheet Row Most Recent Value  Orbital Region No depletion  Upper Arm Region No depletion  Thoracic and Lumbar Region No depletion  Buccal Region No depletion  Temple Region No depletion  Clavicle Bone Region No depletion  Clavicle and Acromion Bone Region No depletion  Scapular Bone Region No depletion  Dorsal Hand No depletion  Patellar Region No depletion  Anterior Thigh Region No depletion  Posterior Calf Region No depletion  Edema (RD Assessment) None  Hair Reviewed  Eyes Reviewed  Mouth Reviewed  Skin Reviewed  Nails Reviewed       Diet Order:   Diet Order             Diet regular Fluid consistency: Thin  Diet effective now                   EDUCATION NEEDS:   Not appropriate for education at this time  Skin:  Skin Assessment: Reviewed RN Assessment  Last BM:  Unknown  Height:   Ht Readings from Last 1 Encounters:  02/25/23 5\' 2"  (1.575 m)    Weight:   Wt Readings from Last 1 Encounters:  02/25/23 63.5 kg    Ideal Body Weight:  50 kg  BMI:  Body mass index is 25.6 kg/m.  Estimated Nutritional Needs:   Kcal:  1700-1900  Protein:  85-100 grams  Fluid:  > 1.7 L  Levada Schilling, RD, LDN, CDCES Registered Dietitian II Certified Diabetes Care and Education Specialist Please refer to [facility] for RD and/or RD on-call/weekend/after hours pager

## 2023-02-27 NOTE — Progress Notes (Signed)
Co-tx with OT today to provide safe mobility and transfers. Pt able to progress to sitting EOB with supervision for several minutes. Sit to stand at [facility] with Mod/MaxA of 2 and repeated vc's to facilitate upright posture prior to side stepping to bedside chair with ModA of 2 and physical assist to advance/guide L LE. Pt positioned in chair with all needs met. Overall good tolerance for skilled PT session. Pt very cooperative and able to follow simple one step commands.    02/27/23 1156  PT Visit Information  Assistance Needed +2  PT/OT/SLP Co-Evaluation/Treatment Yes  Reason for Co-Treatment For patient/therapist safety;Necessary to address cognition/behavior during functional activity;Complexity of the patient's impairments (multi-system involvement)  PT goals addressed during session Balance;Mobility/safety with mobility  History of Present Illness Pt is a 62 y/o F admitted on 02/25/23 after presenting with acut onset of expressive aphasia & R gaze preference with L sided weakness. Pt is being treated for acute CVA. PMH: DM2, CKD 3B, seizure disorder, HTN, CVA  Precautions  Precautions Fall  Precaution Comments hx R hemi  Restrictions  Weight Bearing Restrictions No  Pain Assessment  Pain Assessment No/denies pain  Cognition  Arousal/Alertness Awake/alert  Behavior During Therapy WFL for tasks assessed/performed  Overall Cognitive Status No family/caregiver present to determine baseline cognitive functioning  Bed Mobility  Overal bed mobility Needs Assistance  Bed Mobility Supine to Sit  Supine to sit Max assist;+2 for physical assistance  General bed mobility comments MaxA to scoot to EOB  Transfers  Overall transfer level Needs assistance  Equipment used Rolling walker (2 wheels)  Transfers Sit to/from Stand  Sit to Stand Max assist;+2 physical assistance  Ambulation/Gait  General Gait Details  (Pt side stepped to bedside chair with support of RW, ModA x 2 and assist to advance L  LE.)  Balance  Overall balance assessment Needs assistance  Sitting-balance support Bilateral upper extremity supported;Feet unsupported  Sitting balance-Leahy Scale Fair  Sitting balance - Comments  (MinA initially to maintain balance transitioning to Supervison)  Standing balance support Bilateral upper extremity supported;During functional activity;Reliant on assistive device for balance  Standing balance-Leahy Scale Poor  General Comments  General comments (skin integrity, edema, etc.) R UE appears weaker than L UE, L LE weaker than R LE  Exercises  Exercises General Lower Extremity  General Exercises - Lower Extremity  [place];Both;5 reps;Seated  PT - End of Session  Equipment Utilized During Treatment Gait belt  Activity Tolerance Patient tolerated treatment well  Patient left in chair;with call bell/phone within reach;with chair alarm set  Nurse Communication Mobility status   PT - Assessment/Plan  PT Plan Current plan remains appropriate  PT Visit Diagnosis Muscle weakness (generalized) (M62.81);Hemiplegia and hemiparesis;Difficulty in walking, not elsewhere classified (R26.2);Other abnormalities of gait and mobility (R26.89);Unsteadiness on feet (R26.81)  Hemiplegia - Right/Left Right  Hemiplegia - caused by Cerebral infarction  PT Frequency (ACUTE ONLY) Min 3X/week  Follow Up Recommendations Skilled nursing-short term rehab (<3 hours/day)  Can patient physically be transported by private vehicle No  Assistance recommended at discharge Frequent or constant Supervision/Assistance  Patient can return home with the following Two people to help with bathing/dressing/bathroom;Two people to help with walking and/or transfers;Help with stairs or ramp for entrance;Direct supervision/assist for medications management;Assistance with feeding;Assist for transportation;Direct supervision/assist for financial management;Assistance with cooking/housework  PT equipment None  recommended by PT (TBD at next facility)  AM-PAC PT "6 Clicks" Mobility Outcome Measure (Version 2)  Help needed turning from your  back to your side while in a flat bed without using bedrails? 1  Help needed moving from lying on your back to sitting on the side of a flat bed without using bedrails? 1  Help needed moving to and from a bed to a chair (including a wheelchair)? 1  Help needed standing up from a chair using your arms (e.g., wheelchair or bedside chair)? 1  Help needed to walk in hospital room? 1  Help needed climbing 3-5 steps with a railing?  1  6 Click Score 6  Consider Recommendation of Discharge To: CIR/SNF/LTACH  Progressive Mobility  What is the highest level of mobility based on the progressive mobility assessment? Level 2 (Chairfast) - Balance while sitting on edge of bed and cannot stand  Activity Transferred from bed to chair  PT Time Calculation  PT Start Time (ACUTE ONLY) 1131  PT Stop Time (ACUTE ONLY) 1156  PT Time Calculation (min) (ACUTE ONLY) 25 min  PT General Charges  $$ ACUTE PT VISIT 1 Visit  PT Treatments  $Therapeutic Activity 8-22 mins   Zadie Cleverly, PTA

## 2023-02-28 DIAGNOSIS — E1121 Type 2 diabetes mellitus with diabetic nephropathy: Secondary | ICD-10-CM

## 2023-02-28 DIAGNOSIS — N184 Chronic kidney disease, stage 4 (severe): Secondary | ICD-10-CM

## 2023-02-28 DIAGNOSIS — D649 Anemia, unspecified: Secondary | ICD-10-CM

## 2023-02-28 DIAGNOSIS — I639 Cerebral infarction, unspecified: Secondary | ICD-10-CM | POA: Diagnosis not present

## 2023-02-28 LAB — GLUCOSE, CAPILLARY
Glucose-Capillary: 99 mg/dL (ref 70–99)
Glucose-Capillary: 168 mg/dL — ABNORMAL HIGH (ref 70–99)
Glucose-Capillary: 268 mg/dL — ABNORMAL HIGH (ref 70–99)
Glucose-Capillary: 105 mg/dL — ABNORMAL HIGH (ref 70–99)

## 2023-02-28 MED ORDER — INSULIN GLARGINE-YFGN 100 UNIT/ML ~~LOC~~ SOLN
8.0000 [IU] | Freq: Every day | SUBCUTANEOUS | Status: DC
  Administered 2023-02-28: 8 [IU] via SUBCUTANEOUS
  Filled 2023-02-28 (×2): qty 0.08

## 2023-02-28 NOTE — Assessment & Plan Note (Signed)
Will add on a ferritin to tomorrow morning's labs to further classify anemia.  Last hemoglobin 8.7.  Discontinue IV fluids in order to prevent further dilution of hemoglobin.

## 2023-02-28 NOTE — Progress Notes (Signed)
Progress Note   Patient: [name] Gilbert JYN:[id number] DOB: [birth year] DOA: 02/25/2023     2 DOS: the patient was seen and examined on 02/28/2023   Brief hospital course: 62 year old African-American female history significant for type 2 diabetes mellitus, stage III chronic kidney disease, seizure disorder, hypertension, previous CVA who presents to the ED with acute onset of expressive aphasia and right gaze deviation and left-sided weakness. MRI confirmed small acute infarct. Neurology consulted by admitting physician.    6/17: Notified by bedside RN that NIH score had increased.  Evaluated patient at bedside.  She seems to be flattened in affect.  Case discussed with neurology.  They feel patient is at neurologic baseline.  Will order head CT.  Echocardiogram completed and pending.   6/18: Repeat head CT without new issues.  Echocardiogram reassuring.  Bubble study negative. 6/19.  Will discontinue IV fluids and discontinue central line.  Encouraged to eat.  Assessment and Plan: * Acute CVA (cerebrovascular accident) (HCC) - Continue aspirin and Plavix since the patient failed Plavix monotherapy..  Continue Crestor   Type II diabetes mellitus with renal manifestations (HCC) And also hyperglycemia.  Last A1c 8.7 a couple months ago.  With creatinine elevation and will decrease long-acting insulin to 8 units at night.  Continue sliding scale.  Anemia, unspecified Will add on a ferritin to tomorrow morning's labs to further classify anemia.  Last hemoglobin 8.7.  Discontinue IV fluids in order to prevent further dilution of hemoglobin.  CKD (chronic kidney disease), stage IV (HCC) Last creatinine 2.16 with a GFR of 25.  Dyslipidemia On Crestor 20 mg daily.  LDL 141  Seizure disorder (HCC) Continue Keppra.  Essential hypertension Continue Norvasc and Toprol        Subjective: Patient states her right side is always a little bit weaker.  Admitted with stroke.  States her  appetite is poor.  Awaiting rehab beds.  Physical Exam: Vitals:   02/27/23 2003 02/28/23 0039 02/28/23 0406 02/28/23 0737  BP: (!) 142/71 128/81 (!) 131/58 (!) 166/76  Pulse: 77 71 71 76  Resp: 20 18 18 18   Temp: 98.3 F (36.8 C) 98.5 F (36.9 C) (!) 97.5 F (36.4 C) 98.6 F (37 C)  TempSrc: Oral Oral Oral Oral  SpO2: 100% 100% 100% 100%  Weight:      Height:       Physical Exam HENT:     Head: Normocephalic.     Mouth/Throat:     Pharynx: No oropharyngeal exudate.  Eyes:     General: Lids are normal.     Conjunctiva/sclera: Conjunctivae normal.  Cardiovascular:     Rate and Rhythm: Normal rate and regular rhythm.     Heart sounds: Normal heart sounds, S1 normal and S2 normal.  Pulmonary:     Breath sounds: No decreased breath sounds, wheezing, rhonchi or rales.  Abdominal:     Palpations: Abdomen is soft.     Tenderness: There is no abdominal tenderness.  Musculoskeletal:     Right lower leg: No swelling.     Left lower leg: No swelling.  Neurological:     Mental Status: She is alert and oriented to person, place, and time.     Comments: Right arm 4+ out of 5 power.  Left arm 4+ out of 5 power.  Able to straight leg raise.     Data Reviewed: Reviewed MRI of the brain from 6/16 Hemoglobin 8.7, LDL 141, creatinine 2.16, sodium 140  Family Communication: Spoke with husband  on the phone.  Disposition: Status is: Inpatient Remains inpatient appropriate because: We do not have a rehab bed  Planned Discharge Destination: Skilled nursing facility    Time spent: 28 minutes  Author: Alford Highland, MD 02/28/2023 1:13 PM  For on call review [email or web address].

## 2023-02-28 NOTE — TOC Progression Note (Signed)
Transition of Care [facility]) - Progression Note    Patient Details  Name: [name] Gilbert MRN: [id number] Date of Birth: [birth year]  Transition of Care [facility]) CM/SW Contact  Garret Reddish, RN Phone Number: 02/28/2023, 10:05 AM  Clinical Narrative:   SNF work up completed.  Attempted to call spouse times 2 but unable to speak to spouse.  I have faxed out to [facility] area SNFs.    TOC will continue to follow for discharge planning.      Expected Discharge Plan: Skilled Nursing Facility Barriers to Discharge: Continued Medical Work up  Expected Discharge Plan and Services                                               Social Determinants of Health (SDOH) Interventions SDOH Screenings   Food Insecurity: No Food Insecurity (02/25/2023)  Housing: Low Risk  (02/25/2023)  Transportation Needs: No Transportation Needs (02/25/2023)  Utilities: Not At Risk (02/25/2023)  Tobacco Use: Low Risk  (02/25/2023)    Readmission Risk Interventions     No data to display

## 2023-02-28 NOTE — Hospital Course (Addendum)
62 year old African-American female history significant for type 2 diabetes mellitus, stage III chronic kidney disease, seizure disorder, hypertension, previous CVA who presents to the ED with acute onset of expressive aphasia and right gaze deviation and left-sided weakness. MRI confirmed small acute infarct. Neurology consulted by admitting physician.    6/17: Notified by bedside RN that NIH score had increased.  Evaluated patient at bedside.  She seems to be flattened in affect.  Case discussed with neurology.  They feel patient is at neurologic baseline.  Will order head CT.  Echocardiogram completed and pending.   6/18: Repeat head CT without new issues.  Echocardiogram reassuring.  Bubble study negative. 6/19.  Will discontinue IV fluids and discontinue central line.  Encouraged to eat. 6/20.  Patient's husband states that she will not eat anything that is baked and he will have to bring in food.  I am okay with that. 6/22.  Patient's sugar was a little low this morning.  Will decrease long-acting insulin at night.  Sugar before lunch elevated. 6/23.  Patient worked with occupational therapy and then told me she did not feel well after that.  She drank an Ensure this morning. 6/24.  Awaiting insurance authorization for rehab.

## 2023-02-28 NOTE — Plan of Care (Signed)
EEG showed mild diffuse slowing without epileptiform abnl. Workup is completed, no further inpatient neurology recommendations. Neurology will be available prn for questions going forward.  Bing Neighbors, MD [facility] [phone]  If 7pm- 7am, please page neurology on call as listed in AMION.

## 2023-02-28 NOTE — Assessment & Plan Note (Addendum)
Creatinine 2.17 with a GFR of 25.  Patient encouraged to eat and drink since we lost IV access.

## 2023-02-28 NOTE — TOC Progression Note (Signed)
Transition of Care [facility]) - Progression Note    Patient Details  Name: [name] Gilbert MRN: [id number] Date of Birth: 1961/02/21  Transition of Care [facility]) CM/SW Contact  Garret Reddish, RN Phone Number: 02/28/2023, 10:25 AM  Clinical Narrative:   I have received a call back from Mr. Bratten.  He informs me that he is agreeable to a bed search in the [place] area.  He does not want patient to go to [facility].    Mr. Shiverdecker reports that prior to admission patient was home and he and patient's daughter was assist with her care at home.  He reports that she did not move around very much at home.  Mr. Cavil informs me that Mrs. Juergensen had been home for about 3 weeks and not been out of a nursing facility very long.  I have spoken to him about patient being in possible co-pay days for Medicare.  I have informed him that which every facility he accepts would like me know if patient is in her co-pay days.  I have informed Mr. Cruise that he would need to be able to make a payment arrangement with the facility if patient is in her Medicare co-pay days.    I have also discussed with Mr. Catanzaro that once patient has completed Short term Rehab he will need to explore is he can manage patient at home.  I have informed him that if he is nota able to manage patient at home after Short Term Rehab he will need to consider apply for Medicaid in the event patient may need long-term care.  I have informed Mr. Day that the SNFs have Social Workers that can also assist with the Medicaid process.    TOC will continue to follow for discharge planning.      Expected Discharge Plan: Skilled Nursing Facility Barriers to Discharge: Continued Medical Work up  Expected Discharge Plan and Services                                               Social Determinants of Health (SDOH) Interventions SDOH Screenings   Food Insecurity: No Food Insecurity (02/25/2023)  Housing: Low Risk  (02/25/2023)   Transportation Needs: No Transportation Needs (02/25/2023)  Utilities: Not At Risk (02/25/2023)  Tobacco Use: Low Risk  (02/25/2023)    Readmission Risk Interventions     No data to display

## 2023-02-28 NOTE — Progress Notes (Signed)
Speech Language Pathology Treatment: Cognitive-Linquistic  Patient Details Name: Jenea Kohorst MRN: [id number] DOB: [birth year] Today's Date: 02/28/2023 Time: 4098-1191 SLP Time Calculation (min) (ACUTE ONLY): 25 min  Assessment / Plan / Recommendation Clinical Impression  Pt seen for follow up cognitive linguistic intervention targeting dynamic assessment. Pt demonstrating expressive language impairment at the confrontational naming level, though demonstrating simple conversational ability for deciding order for breakfast and during greetings. Offering binary choices and repetition proving to aid expressive language. Suspect language assessment (oral reading and picture description) is impacted by visual processing and attention deficits based on pt tendency to hold/gaze at visual stimuli without overt scanning or identification of words/items despite visual and verbal cues. Pt with mild dysarthria, detected in connected speech. Receptive language is intact for one step commands. Overall, presentation is grossly consistent with initial assessment and likely largely consistent (or nearly so) to pt's baseline. Recommend follow up SLP services at next venue of care for further assessment/intervention. No further acute services indicated.     HPI HPI: Pt is a 62 y/o F admitted on 02/25/23 after presenting with acut onset of expressive aphasia & R gaze preference with L sided weakness. Pt is being treated for acute CVA. PMH: DM2, CKD 3B, seizure disorder, HTN, CVA. MRI, 02/25/23, "1. Small acute posterior left frontal lobe subcortical white matter  infarct, near the left centrum semiovale.  And contralateral posterior right frontal lobe white matter infarct  which appears recurrent for extended from similar ischemia in April.  No associated hemorrhage or mass effect.     2. Otherwise stable advanced chronic ischemic changes in the brain,  disproportionately affecting the PCA territories." CXR 6/17:  "Underinflation.  No acute cardiopulmonary disease."      SLP Plan  Discharge SLP treatment due to (comment) (gross consistency in presentation- recommend rehab intervention at next level of care)      Recommendations for follow up therapy are one component of a multi-disciplinary discharge planning process, led by the attending physician.  Recommendations may be updated based on patient status, additional functional criteria and insurance authorization.    Recommendations               Frequent or constant Supervision/Assistance Cognitive communication deficit (R41.841)     Discharge SLP treatment due to (comment) (gross consistency in presentation- recommend rehab intervention at next level of care)    [place] Sahvanna Mcmanigal Clapp  MS [facility] SLP   [place] J Clapp  02/28/2023, 10:14 AM

## 2023-02-28 NOTE — NC FL2 (Signed)
[place] MEDICAID FL2 LEVEL OF CARE FORM     IDENTIFICATION  Patient Name: [name] Gilbert Birthdate: July 02, 1961 Sex: female Admission Date (Current Location): 02/25/2023  [facility] and [place] Number:  Chiropodist and Address:  [facility], [address], [place], [place] [address]      Provider Number: [id number]  Attending Physician Name and Address:  Alford Highland, MD  Relative Name and Phone Number:  [name] [phone]    Current Level of Care: Hospital Recommended Level of Care: Skilled Nursing Facility Prior Approval Number:    Date Approved/Denied:   PASRR Number: [id number]  Discharge Plan: SNF    Current Diagnoses: Patient Active Problem List   Diagnosis Date Noted   Acute CVA (cerebrovascular accident) (HCC) 02/25/2023   Expressive aphasia 02/25/2023   Acute proctitis 01/22/2023   Acute renal failure superimposed on stage 3b chronic kidney disease (HCC) 01/22/2023   Constipation 01/22/2023   History of stroke 01/22/2023   TIA (transient ischemic attack) 01/01/2023   Essential hypertension 01/01/2023   Seizure disorder (HCC) 01/01/2023   Dyslipidemia 01/01/2023   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   Advanced care planning/counseling discussion 02/02/2022   Left thalamic infarction (HCC) 02/02/2022   Hyperkalemia 02/01/2022   Closed fracture of right distal fibula    HLD (hyperlipidemia) 01/25/2022   Type II diabetes mellitus with renal manifestations (HCC) 01/25/2022   Stage 3b chronic kidney disease (CKD) (HCC) 01/25/2022   Neurologic abnormality 06/13/2021   Dehydration 06/12/2021   Headache 06/12/2021   Abdominal pain 06/12/2021   Hypoglycemia associated with diabetes (HCC) 06/12/2021   Acute respiratory failure with hypoxia (HCC)    HHNC (hyperglycemic hyperosmolar nonketotic coma) (HCC) 10/01/2019   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    Orientation RESPIRATION BLADDER Height & Weight     Self, Time, Situation, Place  Normal Continent Weight: 63.5 kg Height:  5\' 2"  (157.5 cm)  BEHAVIORAL SYMPTOMS/MOOD NEUROLOGICAL BOWEL NUTRITION STATUS    Convulsions/Seizures Incontinent  (See Discharge Summary)  AMBULATORY STATUS COMMUNICATION OF NEEDS Skin   Extensive Assist Verbally Normal                       Personal Care Assistance Level of Assistance  Bathing, Feeding, Dressing Bathing Assistance: Maximum assistance Feeding assistance: Maximum assistance Dressing Assistance: Maximum assistance     Functional Limitations Info  Sight, Hearing, Speech Sight Info: Adequate Hearing Info: Adequate Speech Info: Impaired (Expressive Aphasia)    SPECIAL CARE FACTORS FREQUENCY  PT (By licensed PT), OT (By licensed OT), Speech therapy     PT Frequency: 5X weekly OT Frequency: 5x Weekly     Speech Therapy Frequency: 2-3 weekly      Contractures Contractures Info: Not present    Additional Factors Info  Code Status, Allergies Code Status Info: DNR Allergies Info: Metformin, Penicillins, Codeine, Hydralazine, Statins           Current Medications (02/28/2023):  This is the current hospital active medication list Current Facility-Administered Medications  Medication Dose Route Frequency Provider Last Rate Last Admin   0.9 %  sodium chloride infusion   Intravenous Continuous Lolita Patella B, MD 75 mL/hr at 02/28/23 0645 Infusion Verify at 02/28/23 0645   acetaminophen (TYLENOL) tablet 650 mg  650 mg Oral Q6H PRN Mansy, Vernetta Honey, MD       Or  acetaminophen (TYLENOL) suppository 650 mg  650 mg Rectal Q6H PRN Mansy, Jan A, MD       amLODipine (NORVASC) tablet 10 mg  10 mg Oral Daily Mansy, Jan A, MD   10 mg at 02/27/23 1009   aspirin EC tablet 81 mg  81 mg Oral Daily Jefferson Fuel, MD   81 mg at 02/27/23 1749    B-complex with vitamin C tablet 1 tablet  1 tablet Oral Daily Mansy, Jan A, MD   1 tablet at 02/27/23 1009   Chlorhexidine Gluconate Cloth 2 % PADS 6 each  6 each Topical Daily Lolita Patella B, MD   6 each at 02/27/23 1309   clopidogrel (PLAVIX) tablet 75 mg  75 mg Oral Daily Mansy, Jan A, MD   75 mg at 02/27/23 1009   enoxaparin (LOVENOX) injection 30 mg  30 mg Subcutaneous Q24H Mansy, Jan A, MD   30 mg at 02/27/23 1009   ezetimibe (ZETIA) tablet 10 mg  10 mg Oral Daily Jefferson Fuel, MD   10 mg at 02/27/23 1748   feeding supplement (ENSURE ENLIVE / ENSURE PLUS) liquid 237 mL  237 mL Oral BID BM Sreenath, Sudheer B, MD   237 mL at 02/27/23 1500   insulin aspart (novoLOG) injection 0-5 Units  0-5 Units Subcutaneous QHS Lolita Patella B, MD   2 Units at 02/27/23 2104   insulin aspart (novoLOG) injection 0-9 Units  0-9 Units Subcutaneous TID WC Lolita Patella B, MD   1 Units at 02/27/23 0909   insulin glargine-yfgn (SEMGLEE) injection 10 Units  10 Units Subcutaneous QHS Mansy, Jan A, MD   10 Units at 02/27/23 2105   levETIRAcetam (KEPPRA) tablet 250 mg  250 mg Oral BID Mansy, Jan A, MD   250 mg at 02/27/23 2105   magnesium hydroxide (MILK OF MAGNESIA) suspension 30 mL  30 mL Oral Daily PRN Mansy, Jan A, MD       metoprolol succinate (TOPROL-XL) 24 hr tablet 25 mg  25 mg Oral Daily Mansy, Jan A, MD   25 mg at 02/27/23 1009   ondansetron (ZOFRAN) tablet 4 mg  4 mg Oral Q6H PRN Mansy, Jan A, MD       Or   ondansetron [facility]) injection 4 mg  4 mg Intravenous Q6H PRN Mansy, Jan A, MD       polyethylene glycol (MIRALAX / GLYCOLAX) packet 17 g  17 g Oral Daily Mansy, Jan A, MD   17 g at 02/27/23 1011   rosuvastatin (CRESTOR) tablet 20 mg  20 mg Oral QHS Mansy, Jan A, MD   20 mg at 02/27/23 2105   senna-docusate (Senokot-S) tablet 2 tablet  2 tablet Oral QHS Mansy, Jan A, MD   2 tablet at 02/27/23 2105   sulfamethoxazole-trimethoprim (BACTRIM) 400-80 MG per tablet 1 tablet  1 tablet Oral Q12H  Tressie Ellis, RPH   1 tablet at 02/27/23 2105   traZODone (DESYREL) tablet 25 mg  25 mg Oral QHS PRN Mansy, Jan A, MD       Vitamin D (Ergocalciferol) (DRISDOL) 1.25 MG (50000 UNIT) capsule 50,000 Units  50,000 Units Oral Q7 days Mansy, Jan A, MD   50,000 Units at 02/25/23 1233     Discharge Medications: Please see discharge summary for a list of discharge medications.  Relevant Imaging Results:  Relevant Lab Results:   Additional Information [id number]  Garret Reddish, RN

## 2023-03-01 DIAGNOSIS — D508 Other iron deficiency anemias: Secondary | ICD-10-CM

## 2023-03-01 DIAGNOSIS — K7469 Other cirrhosis of liver: Secondary | ICD-10-CM | POA: Diagnosis not present

## 2023-03-01 DIAGNOSIS — K7460 Unspecified cirrhosis of liver: Secondary | ICD-10-CM | POA: Insufficient documentation

## 2023-03-01 DIAGNOSIS — E1121 Type 2 diabetes mellitus with diabetic nephropathy: Secondary | ICD-10-CM | POA: Diagnosis not present

## 2023-03-01 DIAGNOSIS — N184 Chronic kidney disease, stage 4 (severe): Secondary | ICD-10-CM | POA: Diagnosis not present

## 2023-03-01 DIAGNOSIS — D509 Iron deficiency anemia, unspecified: Secondary | ICD-10-CM

## 2023-03-01 DIAGNOSIS — I639 Cerebral infarction, unspecified: Secondary | ICD-10-CM | POA: Diagnosis not present

## 2023-03-01 LAB — BASIC METABOLIC PANEL
Anion gap: 7 (ref 5–15)
BUN: 17 mg/dL (ref 8–23)
CO2: 20 mmol/L — ABNORMAL LOW (ref 22–32)
Calcium: 8.6 mg/dL — ABNORMAL LOW (ref 8.9–10.3)
Chloride: 112 mmol/L — ABNORMAL HIGH (ref 98–111)
Creatinine, Ser: 2.08 mg/dL — ABNORMAL HIGH (ref 0.44–1.00)
GFR, Estimated: 26 mL/min — ABNORMAL LOW (ref >60)
Glucose, Bld: 158 mg/dL — ABNORMAL HIGH (ref 70–99)
Potassium: 4.1 mmol/L (ref 3.5–5.1)
Sodium: 139 mmol/L (ref 135–145)

## 2023-03-01 LAB — CK: Total CK: 128 U/L (ref 38–234)

## 2023-03-01 LAB — GLUCOSE, CAPILLARY
Glucose-Capillary: 139 mg/dL — ABNORMAL HIGH (ref 70–99)
Glucose-Capillary: 253 mg/dL — ABNORMAL HIGH (ref 70–99)
Glucose-Capillary: 171 mg/dL — ABNORMAL HIGH (ref 70–99)
Glucose-Capillary: 89 mg/dL (ref 70–99)

## 2023-03-01 LAB — HEMOGLOBIN: Hemoglobin: 10.4 g/dL — ABNORMAL LOW (ref 12.0–15.0)

## 2023-03-01 LAB — FERRITIN: Ferritin: 92 ng/mL (ref 11–307)

## 2023-03-01 MED ORDER — INSULIN GLARGINE-YFGN 100 UNIT/ML ~~LOC~~ SOLN
10.0000 [IU] | Freq: Every day | SUBCUTANEOUS | Status: DC
  Administered 2023-03-01: 10 [IU] via SUBCUTANEOUS
  Filled 2023-03-01 (×2): qty 0.1

## 2023-03-01 NOTE — Progress Notes (Signed)
Per Dr Renae Gloss, dc tele monitoring

## 2023-03-01 NOTE — Progress Notes (Signed)
Physical Therapy Treatment Patient Details Name: [name] Gilbert MRN: [id number] DOB: 08-24-1961 Today's Date: 03/01/2023   History of Present Illness Pt is a 62 y/o F admitted on 02/25/23 after presenting with acut onset of expressive aphasia & R gaze preference with L sided weakness. Pt is being treated for acute CVA. PMH: DM2, CKD 3B, seizure disorder, HTN, CVA    PT Comments    Pt received in bed, agreed to PT session. MaxA to transfer supine to sit EOB and attain static sitting balance with feet and B Ue's support and Supervision. Pt without LOB for several minutes. MaxA required to stand from bed without AD. Pt struggled to initiate side step to Left requiring assist to slide L LE towards chair and safely lower to sitting. All needs met, pt reclined, BP 140/62, HR 65. Continue PT per POC.   Recommendations for follow up therapy are one component of a multi-disciplinary discharge planning process, led by the attending physician.  Recommendations may be updated based on patient status, additional functional criteria and insurance authorization.  Follow Up Recommendations  Can patient physically be transported by private vehicle: No    Assistance Recommended at Discharge Frequent or constant Supervision/Assistance  Patient can return home with the following Two people to help with bathing/dressing/bathroom;Two people to help with walking and/or transfers;Help with stairs or ramp for entrance;Direct supervision/assist for medications management;Assistance with feeding;Assist for transportation;Direct supervision/assist for financial management;Assistance with cooking/housework   Equipment Recommendations  None recommended by PT (TBD at next facility)    Recommendations for Other Services       Precautions / Restrictions Precautions Precautions: Fall Precaution Comments: hx R hemi Restrictions Weight Bearing Restrictions: No     Mobility  Bed Mobility Overal bed mobility: Needs  Assistance Bed Mobility: Supine to Sit Rolling: Max assist   Supine to sit: Max assist, HOB elevated     General bed mobility comments: MaxA to scoot to EOB    Transfers Overall transfer level: Needs assistance Equipment used: None Transfers: Bed to chair/wheelchair/BSC, Sit to/from Stand Sit to Stand: Max assist Stand pivot transfers: Max assist         General transfer comment:  (Difficulty side stepping towards chair positioned on Left side)    Ambulation/Gait               General Gait Details:  (unable)   Stairs             Wheelchair Mobility    Modified Rankin (Stroke Patients Only)       Balance Overall balance assessment: Needs assistance Sitting-balance support: Feet supported, Feet unsupported, Bilateral upper extremity supported Sitting balance-Leahy Scale: Fair Sitting balance - Comments:  (Pt sat EOB for several minutes with Supervision)   Standing balance support: During functional activity, Bilateral upper extremity supported, Reliant on assistive device for balance Standing balance-Leahy Scale: Poor                              Cognition Arousal/Alertness: Awake/alert Behavior During Therapy: WFL for tasks assessed/performed Overall Cognitive Status: Within Functional Limits for tasks assessed                                 General Comments: Pt with some word finding difficulties, impaired memory, decreased ability to follow simple commands throughout session.        Exercises General Exercises -  Lower Extremity Ankle Circles/Pumps: AAROM, Both, 10 reps Long Arc Quad: AAROM, Both, 5 reps, Seated Heel Slides: AAROM, Both, 5 reps    General Comments General comments (skin integrity, edema, etc.): Delayed response once positioned in chair. Pt w/o dizziness. BP 140/62, HR 65, SpO2 100% on RA in long sitting      Pertinent Vitals/Pain Pain Assessment Pain Assessment: Faces Faces Pain Scale: Hurts  a little bit Pain Location: L hip in sitting Pain Descriptors / Indicators: Discomfort, Grimacing Pain Intervention(s): Repositioned, Monitored during session    Home Living                          Prior Function            PT Goals (current goals can now be found in the care plan section)      Frequency    Min 3X/week      PT Plan Current plan remains appropriate    Co-evaluation              AM-PAC PT "6 Clicks" Mobility   Outcome Measure  Help needed turning from your back to your side while in a flat bed without using bedrails?: Total Help needed moving from lying on your back to sitting on the side of a flat bed without using bedrails?: Total Help needed moving to and from a bed to a chair (including a wheelchair)?: Total Help needed standing up from a chair using your arms (e.g., wheelchair or bedside chair)?: Total Help needed to walk in hospital room?: Total Help needed climbing 3-5 steps with a railing? : Total 6 Click Score: 6    End of Session Equipment Utilized During Treatment: Gait belt Activity Tolerance: Patient tolerated treatment well Patient left: in chair;with call bell/phone within reach;with chair alarm set Nurse Communication: Mobility status PT Visit Diagnosis: Muscle weakness (generalized) (M62.81);Hemiplegia and hemiparesis;Difficulty in walking, not elsewhere classified (R26.2);Other abnormalities of gait and mobility (R26.89);Unsteadiness on feet (R26.81) Hemiplegia - Right/Left: Right Hemiplegia - caused by: Cerebral infarction     Time: 1130-1201 PT Time Calculation (min) (ACUTE ONLY): 31 min  Charges:  $Therapeutic Exercise: 8-22 mins $Therapeutic Activity: 8-22 mins                    Zadie Cleverly, PTA  Jannet Askew 03/01/2023, 1:48 PM

## 2023-03-01 NOTE — Assessment & Plan Note (Addendum)
Seen on prior imaging.  Previous hepatitis B and C testing negative.  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

## 2023-03-01 NOTE — Progress Notes (Signed)
Mobility Specialist - Progress Note   03/01/23 1434  Mobility  Activity Repositioned in chair;Transferred from chair to bed  Level of Assistance Maximum assist, patient does 25-49%  Assistive Device None  Activity Response Tolerated well  $Mobility charge 1 Mobility  Mobility Specialist Start Time (ACUTE ONLY) 1403  Mobility Specialist Stop Time (ACUTE ONLY) 1430  Mobility Specialist Time Calculation (min) (ACUTE ONLY) 27 min   Pt slumped down in the recliner with BLE over the edge of the foot rest upon entry, utilizing RA. MS initially repositioned Pt in chair MaxA, before noticing Pt's soaked gown and urine on the floor. MS opted to return Pt to bed for pericare. Pt transferred bed via squad pivot transfer-- demonstrating poor sitting balance, requiring MaxA for trunk support during transfer. Pt rolled left to right ModA while MS completed pericare. Pt returned supine, left with alarm set and needs within reach. RN notified.   Brycelynn Empey Mobility Specialist 03/01/23 2:45 PM

## 2023-03-01 NOTE — Progress Notes (Signed)
Progress Note   Patient: Ryen Aebersold ZOX:[id number] DOB: 1960/12/24 DOA: 02/25/2023     3 DOS: the patient was seen and examined on 03/01/2023   Brief hospital course: 62 year old African-American female history significant for type 2 diabetes mellitus, stage III chronic kidney disease, seizure disorder, hypertension, previous CVA who presents to the ED with acute onset of expressive aphasia and right gaze deviation and left-sided weakness. MRI confirmed small acute infarct. Neurology consulted by admitting physician.    6/17: Notified by bedside RN that NIH score had increased.  Evaluated patient at bedside.  She seems to be flattened in affect.  Case discussed with neurology.  They feel patient is at neurologic baseline.  Will order head CT.  Echocardiogram completed and pending.   6/18: Repeat head CT without new issues.  Echocardiogram reassuring.  Bubble study negative. 6/19.  Will discontinue IV fluids and discontinue central line.  Encouraged to eat. 6/20.  Patient's husband states that she will not eat anything that is baked and he will have to bring in food.  I am okay with that.  Assessment and Plan: * Acute CVA (cerebrovascular accident) (HCC) - Continue aspirin and Plavix since the patient failed Plavix monotherapy..  Continue Crestor   Type II diabetes mellitus with renal manifestations (HCC) And also hyperglycemia.  Last A1c 8.7 a couple months ago.  Will increase long-acting insulin to 10 units at night.  Continue sliding scale.  Liver cirrhosis (HCC) Seen on prior imaging.  Previous hepatitis B and C testing negative.  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  Iron deficiency anemia Repeat hemoglobin up to 10.4  CKD (chronic kidney disease), stage IV (HCC) Last creatinine 2.08 with GFR of 26.  Dyslipidemia On Crestor 20 mg daily.  LDL 141  Seizure disorder (HCC) Continue Keppra.  Essential hypertension Continue  Norvasc and Toprol        Subjective: Patient again encouraged to eat.  Patient admitted with stroke.  Still feels pretty weak.  Able to straight leg raise today.  Able to lift both arms up off the bed but weak with both arms.  Physical Exam: Vitals:   02/28/23 2344 03/01/23 0553 03/01/23 0726 03/01/23 1634  BP: (!) 131/56 (!) 156/67 (!) 140/60 136/67  Pulse: 66 67 65 67  Resp: 18 18 18 16   Temp: 98 F (36.7 C) 97.9 F (36.6 C) 98 F (36.7 C) (!) 97.4 F (36.3 C)  TempSrc:   Oral Oral  SpO2: 100% 100% 100% 100%  Weight:      Height:       Physical Exam HENT:     Head: Normocephalic.     Mouth/Throat:     Pharynx: No oropharyngeal exudate.  Eyes:     General: Lids are normal.     Conjunctiva/sclera: Conjunctivae normal.  Cardiovascular:     Rate and Rhythm: Normal rate and regular rhythm.     Heart sounds: Normal heart sounds, S1 normal and S2 normal.  Pulmonary:     Breath sounds: No decreased breath sounds, wheezing, rhonchi or rales.  Abdominal:     Palpations: Abdomen is soft.     Tenderness: There is no abdominal tenderness.  Musculoskeletal:     Right lower leg: No swelling.     Left lower leg: No swelling.  Skin:    General: Skin is warm.     Findings: No rash.  Neurological:     Mental Status: She is alert and oriented to  person, place, and time.     Comments: Right arm 4+ out of 5 power.  Left arm 4+ out of 5 power.  Able to straight leg raise.     Data Reviewed: Creatinine 2.08, ferritin 92, hemoglobin 10.4 Reviewed prior admission labs hepatitis profile C and B are negative, ANA positive with anti-Ro antibody slightly elevated at 7.5 which is in the equivocal range.  Family Communication: Updated husband on the phone  Disposition: Status is: Inpatient Remains inpatient appropriate because: TOC looking into rehab options  Planned Discharge Destination: Skilled nursing facility    Time spent: 28 minutes  Author: Alford Highland,  MD 03/01/2023 4:45 PM  For on call review [email or web address].

## 2023-03-01 NOTE — TOC Progression Note (Addendum)
Transition of Care [facility]) - Progression Note    Patient Details  Name: Hartlyn Radaker MRN: [id number] Date of Birth: April 30, 1961  [facility] CM/SW Contact  Garret Reddish, RN Phone Number: 03/01/2023, 1:59 PM  Clinical Narrative:   Chart reviewed.  Bed offers reviewed with patient's husband.  He would like for me to fax out to [facility].  He will consider [facility] if [facility] declines.  I have sent referral to [facility].    TOC will continue to follow for discharge planning.      Expected Discharge Plan: Skilled Nursing Facility Barriers to Discharge: Continued Medical Work up  Expected Discharge Plan and Services                                               Social Determinants of Health (SDOH) Interventions SDOH Screenings   Food Insecurity: No Food Insecurity (02/25/2023)  Housing: Low Risk  (02/25/2023)  Transportation Needs: No Transportation Needs (02/25/2023)  Utilities: Not At Risk (02/25/2023)  Tobacco Use: Low Risk  (02/25/2023)    Readmission Risk Interventions     No data to display

## 2023-03-01 NOTE — Assessment & Plan Note (Addendum)
Repeat hemoglobin up to 10.7

## 2023-03-01 NOTE — Progress Notes (Signed)
Nutrition Follow-up  DOCUMENTATION CODES:   Not applicable  INTERVENTION:   -Continue regular diet -Continue MVI with minerals daily -Continue Ensure Enlive po BID, each supplement provides 350 kcal and 20 grams of protein -Continue feeding assistance with meals -Encourage adequate PO intake; family can bring in outside food as desired  NUTRITION DIAGNOSIS:   Inadequate oral intake related to poor appetite as evidenced by meal completion < 50%.  Ongoing  GOAL:   Patient will meet greater than or equal to 90% of their needs  Progressing   MONITOR:   PO intake, Supplement acceptance  REASON FOR ASSESSMENT:   Low Braden    ASSESSMENT:   Pt with history significant for type 2 diabetes mellitus, stage III chronic kidney disease, seizure disorder, hypertension, previous CVA who presents with acute onset of expressive aphasia and right gaze deviation and left-sided weakness. MRI confirmed small acute infarct.  Reviewed I/O's: -1.4 L x 24 hours and -149 ml since admission  UOP: 1.8 L x 24 hours   Pt with variable oral intake. Noted meal completions 30-100%. She is drinking Ensure supplements. Pt historically does not like hospital and does not like food if it is baked. Family has been brining in outside food for pt to eat. Meal completions 30-100%.   No new wt since admission.   Per TOC notes, awaiting SNF placement at discharge.   Medications reviewed and include keppra, miralax, senokot, and vitamin D. 03/01/23  Labs reviewed: CBGS: 139-253 (inpatient orders for glycemic control are 0-5 units insulin aspart daily at bedtime, 0-9 units insulin aspart TID with meals, and 10 units insulin glargine-yfgn daily at bedtime).    Diet Order:   Diet Order             Diet regular Fluid consistency: Thin  Diet effective now                   EDUCATION NEEDS:   Not appropriate for education at this time  Skin:  Skin Assessment: Reviewed RN Assessment  Last BM:   03/01/23 (type 5)  Height:   Ht Readings from Last 1 Encounters:  02/25/23 5\' 2"  (1.575 m)    Weight:   Wt Readings from Last 1 Encounters:  02/25/23 63.5 kg    Ideal Body Weight:  50 kg  BMI:  Body mass index is 25.6 kg/m.  Estimated Nutritional Needs:   Kcal:  1700-1900  Protein:  85-100 grams  Fluid:  > 1.7 L    Levada Schilling, RD, LDN, CDCES Registered Dietitian II Certified Diabetes Care and Education Specialist Please refer to [facility] for RD and/or RD on-call/weekend/after hours pager

## 2023-03-02 DIAGNOSIS — K7469 Other cirrhosis of liver: Secondary | ICD-10-CM | POA: Diagnosis not present

## 2023-03-02 DIAGNOSIS — I639 Cerebral infarction, unspecified: Secondary | ICD-10-CM | POA: Diagnosis not present

## 2023-03-02 DIAGNOSIS — D508 Other iron deficiency anemias: Secondary | ICD-10-CM | POA: Diagnosis not present

## 2023-03-02 DIAGNOSIS — E1121 Type 2 diabetes mellitus with diabetic nephropathy: Secondary | ICD-10-CM | POA: Diagnosis not present

## 2023-03-02 LAB — GLUCOSE, CAPILLARY
Glucose-Capillary: 84 mg/dL (ref 70–99)
Glucose-Capillary: 90 mg/dL (ref 70–99)
Glucose-Capillary: 141 mg/dL — ABNORMAL HIGH (ref 70–99)
Glucose-Capillary: 313 mg/dL — ABNORMAL HIGH (ref 70–99)
Glucose-Capillary: 206 mg/dL — ABNORMAL HIGH (ref 70–99)

## 2023-03-02 MED ORDER — ISOSORBIDE MONONITRATE ER 30 MG PO TB24
30.0000 mg | ORAL_TABLET | Freq: Every day | ORAL | Status: DC
  Administered 2023-03-02 – 2023-03-06 (×5): 30 mg via ORAL
  Filled 2023-03-02 (×6): qty 1

## 2023-03-02 MED ORDER — INSULIN GLARGINE-YFGN 100 UNIT/ML ~~LOC~~ SOLN
7.0000 [IU] | Freq: Every day | SUBCUTANEOUS | Status: DC
  Administered 2023-03-02: 7 [IU] via SUBCUTANEOUS
  Filled 2023-03-02: qty 0.07

## 2023-03-02 NOTE — Progress Notes (Signed)
Progress Note   Patient: [name] Gilbert MVH:[id number] DOB: 1960-10-15 DOA: 02/25/2023     4 DOS: the patient was seen and examined on 03/02/2023   Brief hospital course: 62 year old African-American female history significant for type 2 diabetes mellitus, stage III chronic kidney disease, seizure disorder, hypertension, previous CVA who presents to the ED with acute onset of expressive aphasia and right gaze deviation and left-sided weakness. MRI confirmed small acute infarct. Neurology consulted by admitting physician.    6/17: Notified by bedside RN that NIH score had increased.  Evaluated patient at bedside.  She seems to be flattened in affect.  Case discussed with neurology.  They feel patient is at neurologic baseline.  Will order head CT.  Echocardiogram completed and pending.   6/18: Repeat head CT without new issues.  Echocardiogram reassuring.  Bubble study negative. 6/19.  Will discontinue IV fluids and discontinue central line.  Encouraged to eat. 6/20.  Patient's husband states that she will not eat anything that is baked and he will have to bring in food.  I am okay with that.  Assessment and Plan: * Acute CVA (cerebrovascular accident) (HCC) - Continue aspirin and Plavix since the patient failed Plavix monotherapy..  Continue Crestor   Type II diabetes mellitus with renal manifestations (HCC) And also hyperglycemia.  Last A1c 8.7 a couple months ago.  Will decrease long-acting insulin to 7 units daily with sugars being a little lower.  Continue sliding scale.  Liver cirrhosis (HCC) Seen on prior imaging.  Previous hepatitis B and C testing negative.  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  Iron deficiency anemia Repeat hemoglobin up to 10.4  CKD (chronic kidney disease), stage IV (HCC) Last creatinine 2.08 with GFR of 26.  Dyslipidemia On Crestor 20 mg daily.  LDL 141  Seizure disorder (HCC) Continue  Keppra.  Essential hypertension Continue Norvasc and Toprol.  Added Imdur with blood pressure being a little high.        Subjective: Patient feels okay.  Still weak.  Does not offer any complaints.  Encouraged to eat and work hard with physical therapy.  Physical Exam: Vitals:   03/01/23 1634 03/01/23 2104 03/02/23 0416 03/02/23 0843  BP: 136/67 135/70 (!) 144/64 (!) 161/65  Pulse: 67 67 65 64  Resp: 16 12 16 16   Temp: (!) 97.4 F (36.3 C) 97.9 F (36.6 C) (!) 97.4 F (36.3 C) 97.7 F (36.5 C)  TempSrc: Oral     SpO2: 100% 100% 100% 100%  Weight:      Height:       Physical Exam HENT:     Head: Normocephalic.     Mouth/Throat:     Pharynx: No oropharyngeal exudate.  Eyes:     General: Lids are normal.     Conjunctiva/sclera: Conjunctivae normal.  Cardiovascular:     Rate and Rhythm: Normal rate and regular rhythm.     Heart sounds: Normal heart sounds, S1 normal and S2 normal.  Pulmonary:     Breath sounds: No decreased breath sounds, wheezing, rhonchi or rales.  Abdominal:     Palpations: Abdomen is soft.     Tenderness: There is no abdominal tenderness.  Musculoskeletal:     Right lower leg: No swelling.     Left lower leg: No swelling.  Skin:    General: Skin is warm.     Findings: No rash.  Neurological:     Mental Status: She is alert and oriented to  person, place, and time.     Comments: Right arm 4+ out of 5 power.  Left arm 4+ out of 5 power.  Able to straight leg raise.     Data Reviewed: Last creatinine 2.08, last hemoglobin 10.4  Family Communication: Updated husband on phone  Disposition: Status is: Inpatient Remains inpatient appropriate because: The rehab bed that she will go to will not be able to except her until Monday.  She will also need insurance authorization prior.  Medically stable.  Planned Discharge Destination: Rehab    Time spent: 27 minutes Case discussed with nursing staff at bedside  Author: Alford Highland,  MD 03/02/2023 2:23 PM  For on call review [email or web address].

## 2023-03-02 NOTE — Progress Notes (Signed)
Occupational Therapy Treatment Patient Details Name: [name] Gilbert MRN: [id number] DOB: [birth year] Today's Date: 03/02/2023   History of present illness Pt is a 62 y/o F admitted on 02/25/23 after presenting with acut onset of expressive aphasia & R gaze preference with L sided weakness. Pt is being treated for acute CVA. PMH: DM2, CKD 3B, seizure disorder, HTN, CVA   OT comments  Upon entering the room, pt supine in bed and agreeable to OT intervention. Pt needing max A for supine >sit to EOB. Pt maintaining balance with close supervision for ~ 10 minutes. OT provides warm washcloth to L visual field and pt able to locate and grab with L hand to wash face with increased time. Pt incorporates B hand washing with cuing to do so. NT arrives to take vitals with pt remaining on EOB. Pt needing max A to returning to supine. Pt placed into trendelenburg and able to push with B LEs and L UE to reposition in bed. Call bell and all needed items within reach.    Recommendations for follow up therapy are one component of a multi-disciplinary discharge planning process, led by the attending physician.  Recommendations may be updated based on patient status, additional functional criteria and insurance authorization.    Assistance Recommended at Discharge Frequent or constant Supervision/Assistance  Patient can return home with the following  Two people to help with walking and/or transfers;Two people to help with bathing/dressing/bathroom;Assistance with cooking/housework;Assistance with feeding;Direct supervision/assist for medications management;Direct supervision/assist for financial management;Assist for transportation;Help with stairs or ramp for entrance   Equipment Recommendations  Other (comment) (defer to next venue of care)       Precautions / Restrictions Precautions Precautions: Fall Precaution Comments: hx R hemi       Mobility Bed Mobility Overal bed mobility: Needs Assistance Bed  Mobility: Supine to Sit, Sit to Supine     Supine to sit: Max assist Sit to supine: Max assist        Transfers Overall transfer level: Needs assistance                       Balance Overall balance assessment: Needs assistance Sitting-balance support: Feet supported, Feet unsupported, Bilateral upper extremity supported Sitting balance-Leahy Scale: Fair                                     ADL either performed or assessed with clinical judgement   ADL Overall ADL's : Needs assistance/impaired     Grooming: Wash/dry hands;Wash/dry face;Sitting;Supervision/safety;Set up                                        Cognition Arousal/Alertness: Awake/alert Behavior During Therapy: WFL for tasks assessed/performed Overall Cognitive Status: Within Functional Limits for tasks assessed                                 General Comments: increased time to process information                   Pertinent Vitals/ Pain       Pain Assessment Pain Assessment: Faces Faces Pain Scale: Hurts a little bit Pain Location: head and stomach Pain Descriptors / Indicators: Discomfort Pain Intervention(s): Monitored during session,  Repositioned         Frequency  Min 2X/week        Progress Toward Goals  OT Goals(current goals can now be found in the care plan section)  Progress towards OT goals: Progressing toward goals     Plan Discharge plan remains appropriate;Frequency remains appropriate       AM-PAC OT "6 Clicks" Daily Activity     Outcome Measure   Help from another person eating meals?: A Lot Help from another person taking care of personal grooming?: A Lot Help from another person toileting, which includes using toliet, bedpan, or urinal?: Total Help from another person bathing (including washing, rinsing, drying)?: Total Help from another person to put on and taking off regular upper body clothing?: A  Lot Help from another person to put on and taking off regular lower body clothing?: Total 6 Click Score: 9    End of Session Equipment Utilized During Treatment: Rolling walker (2 wheels)  OT Visit Diagnosis: Muscle weakness (generalized) (M62.81);Hemiplegia and hemiparesis Hemiplegia - Right/Left: Right   Activity Tolerance Patient tolerated treatment well   Patient Left with call bell/phone within reach;in chair;with chair alarm set   Nurse Communication Mobility status        Time: 1511-1530 OT Time Calculation (min): 19 min  Charges: OT General Charges $OT Visit: 1 Visit OT Treatments $Self Care/Home Management : 8-22 mins  Jackquline Denmark, MS, OTR/L , CBIS ascom [phone]  03/02/23, 4:21 PM

## 2023-03-02 NOTE — TOC Progression Note (Signed)
Transition of Care [facility]) - Progression Note    Patient Details  Name: [name] Gilbert MRN: [id number] Date of Birth: Feb 06, 1961  Transition [facility]) CM/SW Contact  Garret Reddish, RN Phone Number: 03/02/2023, 1:07 PM  Clinical Narrative:   Chart reviewed.  I have spoken with [name].  He has accepted bed offer at [facility].  I have informed [name] that [facility] does not accept patient's insurance.    I have spoken with Marcelino Duster, Admission Coordinator at [facility] and she reports that she will have a bed for [name] on Monday.    TOC will start SNF authorization on Sunday.    TOC will follow for discharge planning.        Expected Discharge Plan: Skilled Nursing Facility Barriers to Discharge: Continued Medical Work up  Expected Discharge Plan and Services                                               Social Determinants of Health (SDOH) Interventions SDOH Screenings   Food Insecurity: No Food Insecurity (02/25/2023)  Housing: Low Risk  (02/25/2023)  Transportation Needs: No Transportation Needs (02/25/2023)  Utilities: Not At Risk (02/25/2023)  Tobacco Use: Low Risk  (02/25/2023)    Readmission Risk Interventions     No data to display

## 2023-03-02 NOTE — Progress Notes (Signed)
Mobility Specialist - Progress Note  03/02/23 1200  Mobility  Activity Dangled on edge of bed;Stood at bedside;Transferred from bed to chair  Level of Assistance Minimal assist, patient does 75% or more  Assistive Device None  Distance Ambulated (ft) 2 ft  Activity Response Tolerated well  $Mobility charge 1 Mobility  Mobility Specialist Start Time (ACUTE ONLY) 1130  Mobility Specialist Stop Time (ACUTE ONLY) 1150  Mobility Specialist Time Calculation (min) (ACUTE ONLY) 20 min   Pt supine upon entry, utilizing RA. Pt agreeable to OOB transfer to the recliner. Pt completed bed mob ModA to dangle EOB, able to self support trunk once seated upright. Pt STS MinA +2 for safety, transferred to the recliner-- extra time needed to sequence pivot steps, requiring verbal cueing for the LLE. Pt left seated in the recliner with alarm set and needs within reach.   Kalla Watson Mobility Specialist 03/02/23 12:19 PM

## 2023-03-03 DIAGNOSIS — I639 Cerebral infarction, unspecified: Secondary | ICD-10-CM | POA: Diagnosis not present

## 2023-03-03 DIAGNOSIS — E785 Hyperlipidemia, unspecified: Secondary | ICD-10-CM | POA: Diagnosis not present

## 2023-03-03 DIAGNOSIS — E162 Hypoglycemia, unspecified: Secondary | ICD-10-CM | POA: Diagnosis not present

## 2023-03-03 DIAGNOSIS — N184 Chronic kidney disease, stage 4 (severe): Secondary | ICD-10-CM | POA: Diagnosis not present

## 2023-03-03 LAB — GLUCOSE, CAPILLARY
Glucose-Capillary: 61 mg/dL — ABNORMAL LOW (ref 70–99)
Glucose-Capillary: 119 mg/dL — ABNORMAL HIGH (ref 70–99)
Glucose-Capillary: 238 mg/dL — ABNORMAL HIGH (ref 70–99)
Glucose-Capillary: 246 mg/dL — ABNORMAL HIGH (ref 70–99)
Glucose-Capillary: 111 mg/dL — ABNORMAL HIGH (ref 70–99)

## 2023-03-03 LAB — BASIC METABOLIC PANEL
Anion gap: 7 (ref 5–15)
BUN: 25 mg/dL — ABNORMAL HIGH (ref 8–23)
CO2: 23 mmol/L (ref 22–32)
Calcium: 8.3 mg/dL — ABNORMAL LOW (ref 8.9–10.3)
Chloride: 109 mmol/L (ref 98–111)
Creatinine, Ser: 2.17 mg/dL — ABNORMAL HIGH (ref 0.44–1.00)
GFR, Estimated: 25 mL/min — ABNORMAL LOW (ref >60)
Glucose, Bld: 117 mg/dL — ABNORMAL HIGH (ref 70–99)
Potassium: 4.2 mmol/L (ref 3.5–5.1)
Sodium: 139 mmol/L (ref 135–145)

## 2023-03-03 LAB — CBC
HCT: 30.2 % — ABNORMAL LOW (ref 36.0–46.0)
Hemoglobin: 10 g/dL — ABNORMAL LOW (ref 12.0–15.0)
MCH: 29.1 pg (ref 26.0–34.0)
MCHC: 33.1 g/dL (ref 30.0–36.0)
MCV: 87.8 fL (ref 80.0–100.0)
Platelets: 246 10*3/uL (ref 150–400)
RBC: 3.44 MIL/uL — ABNORMAL LOW (ref 3.87–5.11)
RDW: 15.5 % (ref 11.5–15.5)
WBC: 5.1 10*3/uL (ref 4.0–10.5)
nRBC: 0 % (ref 0.0–0.2)

## 2023-03-03 LAB — AMMONIA: Ammonia: 16 umol/L (ref 9–35)

## 2023-03-03 MED ORDER — DEXTROSE 50 % IV SOLN
12.5000 g | Freq: Once | INTRAVENOUS | Status: DC
  Filled 2023-03-03: qty 50

## 2023-03-03 MED ORDER — INSULIN GLARGINE-YFGN 100 UNIT/ML ~~LOC~~ SOLN
5.0000 [IU] | Freq: Every day | SUBCUTANEOUS | Status: DC
  Administered 2023-03-03: 5 [IU] via SUBCUTANEOUS
  Filled 2023-03-03 (×2): qty 0.05

## 2023-03-03 NOTE — Progress Notes (Signed)
RN tried once and I/v team tried twice, not able to get new I/V on her.MD made aware and he is ok to leave out.

## 2023-03-03 NOTE — Progress Notes (Signed)
Progress Note   Patient: [name] Gilbert ZOX:[id number] DOB: [birth year] DOA: 02/25/2023     5 DOS: the patient was seen and examined on 03/03/2023   Brief hospital course: 62 year old African-American female history significant for type 2 diabetes mellitus, stage III chronic kidney disease, seizure disorder, hypertension, previous CVA who presents to the ED with acute onset of expressive aphasia and right gaze deviation and left-sided weakness. MRI confirmed small acute infarct. Neurology consulted by admitting physician.    6/17: Notified by bedside RN that NIH score had increased.  Evaluated patient at bedside.  She seems to be flattened in affect.  Case discussed with neurology.  They feel patient is at neurologic baseline.  Will order head CT.  Echocardiogram completed and pending.   6/18: Repeat head CT without new issues.  Echocardiogram reassuring.  Bubble study negative. 6/19.  Will discontinue IV fluids and discontinue central line.  Encouraged to eat. 6/20.  Patient's husband states that she will not eat anything that is baked and he will have to bring in food.  I am okay with that. 6/20.  Patient's sugar was a little low this morning.  Will decrease long-acting insulin at night.  Sugar before lunch elevated.  Assessment and Plan: * Acute CVA (cerebrovascular accident) (HCC) - Continue aspirin and Plavix since the patient failed Plavix monotherapy..  Continue Crestor   Essential hypertension Continue Norvasc Toprol and Imdur.  Today's blood pressure better controlled.  Type II diabetes mellitus with renal manifestations (HCC) And also hyperglycemia.  Last A1c 8.7 a couple months ago.  Will decrease long-acting insulin to 5 units daily with sugars being a little lower.  Continue sliding scale.  CKD (chronic kidney disease), stage IV (HCC) Creatinine 2.17 with a GFR of 25.  Patient encouraged to eat and drink since we lost IV access.  Dyslipidemia On Crestor 20 mg daily.  LDL  141  Seizure disorder (HCC) Continue Keppra.  Iron deficiency anemia Repeat hemoglobin up to 10.0  Liver cirrhosis (HCC) Seen on prior imaging.  Previous hepatitis B and C testing negative.  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        Subjective: Patient answers a few yes or no questions.  Encouraged to eat since we lost IV access.  Had a low sugar this morning and this afternoon elevated sugar. Admitted with stroke.  Patient's husband stated she ate a good lunch.  Physical Exam: Vitals:   03/02/23 2105 03/03/23 0250 03/03/23 0736 03/03/23 1233  BP: 119/62 125/67 (!) 118/50 (!) 127/57  Pulse: 74 66 62 65  Resp: 18 18 16 16   Temp: 98.3 F (36.8 C) 98 F (36.7 C) 98 F (36.7 C) 97.6 F (36.4 C)  TempSrc:    Oral  SpO2: 100% 100% 100% 100%  Weight:      Height:       Physical Exam HENT:     Head: Normocephalic.     Mouth/Throat:     Pharynx: No oropharyngeal exudate.  Eyes:     General: Lids are normal.     Conjunctiva/sclera: Conjunctivae normal.  Cardiovascular:     Rate and Rhythm: Normal rate and regular rhythm.     Heart sounds: Normal heart sounds, S1 normal and S2 normal.  Pulmonary:     Breath sounds: No decreased breath sounds, wheezing, rhonchi or rales.  Abdominal:     Palpations: Abdomen is soft.     Tenderness: There is no abdominal tenderness.  Musculoskeletal:  Right lower leg: No swelling.     Left lower leg: No swelling.  Skin:    General: Skin is warm.     Findings: No rash.  Neurological:     Mental Status: She is alert and oriented to person, place, and time.     Comments: Right arm 4+ out of 5 power.  Left arm 4+ out of 5 power.      Data Reviewed: Creatinine 2.17, hemoglobin 10  Family Communication: Spoke with husband on the phone.  Disposition: Status is: Inpatient Remains inpatient appropriate because: Rehab will be able to take patient hopefully on Monday.  Planned Discharge  Destination: Rehab    Time spent: 27 minutes  Author: Alford Highland, MD 03/03/2023 3:33 PM  For on call review [email or web address].

## 2023-03-03 NOTE — Progress Notes (Signed)
SN did a bladder scan on the patient since her purewick did not have any urine in the cannister, Bladder scan determined patient had 62 ml in her bladder. This SN offered the patient fluids but she refused. SN will continue to monitor.

## 2023-03-04 DIAGNOSIS — I639 Cerebral infarction, unspecified: Secondary | ICD-10-CM | POA: Diagnosis not present

## 2023-03-04 DIAGNOSIS — E785 Hyperlipidemia, unspecified: Secondary | ICD-10-CM | POA: Diagnosis not present

## 2023-03-04 DIAGNOSIS — I10 Essential (primary) hypertension: Secondary | ICD-10-CM | POA: Diagnosis not present

## 2023-03-04 DIAGNOSIS — E1122 Type 2 diabetes mellitus with diabetic chronic kidney disease: Secondary | ICD-10-CM | POA: Diagnosis not present

## 2023-03-04 LAB — GLUCOSE, CAPILLARY
Glucose-Capillary: 187 mg/dL — ABNORMAL HIGH (ref 70–99)
Glucose-Capillary: 210 mg/dL — ABNORMAL HIGH (ref 70–99)
Glucose-Capillary: 323 mg/dL — ABNORMAL HIGH (ref 70–99)
Glucose-Capillary: 135 mg/dL — ABNORMAL HIGH (ref 70–99)

## 2023-03-04 MED ORDER — INSULIN GLARGINE-YFGN 100 UNIT/ML ~~LOC~~ SOLN
8.0000 [IU] | Freq: Every day | SUBCUTANEOUS | Status: DC
  Administered 2023-03-04 – 2023-03-11 (×8): 8 [IU] via SUBCUTANEOUS
  Filled 2023-03-04 (×9): qty 0.08

## 2023-03-04 NOTE — Progress Notes (Signed)
Physical Therapy Treatment Patient Details Name: [name] Gilbert MRN: [id number] DOB: 1961-03-29 Today's Date: 03/04/2023   History of Present Illness Pt is a 62 y/o F admitted on 02/25/23 after presenting with acut onset of expressive aphasia & R gaze preference with L sided weakness. Pt is being treated for acute CVA. PMH: DM2, CKD 3B, seizure disorder, HTN, CVA    PT Comments    Pt generally lethargic upon entering room.  Attempts to awaken pt with supine P/AAROM and rolling left/right with max A/dependant.  She does eventually awaken and is able to transition to sitting with max a x 1/dependant and remain sitting x 10+ minutes EOB with close contact guard/occasional min assist for balance corrections. She does do some minimal reaching with B hands and some LAQ/ab/adduction with cues.  She is able to fully drink her Ensure drink in sitting with assist to hold and place straw.  No coughing noted with a strong swallow.  Attempted lateral scooting in sitting but remains dependant.  Returned to supine with max a and positioned for comfort.  Generally fatigued with activity.     Recommendations for follow up therapy are one component of a multi-disciplinary discharge planning process, led by the attending physician.  Recommendations may be updated based on patient status, additional functional criteria and insurance authorization.  Follow Up Recommendations       Assistance Recommended at Discharge Frequent or constant Supervision/Assistance  Patient can return home with the following Two people to help with bathing/dressing/bathroom;Two people to help with walking and/or transfers;Help with stairs or ramp for entrance;Direct supervision/assist for medications management;Assistance with feeding;Assist for transportation;Direct supervision/assist for financial management;Assistance with cooking/housework   Equipment Recommendations  None recommended by PT    Recommendations for Other Services        Precautions / Restrictions Precautions Precautions: Fall Precaution Comments: hx R hemi, new L weakness Restrictions Weight Bearing Restrictions: No     Mobility  Bed Mobility Overal bed mobility: Needs Assistance Bed Mobility: Supine to Sit, Sit to Supine     Supine to sit: Max assist Sit to supine: Max assist     Patient Response: Cooperative, Flat affect  Transfers Overall transfer level: Needs assistance Equipment used: None              Lateral/Scoot Transfers: Total assist      Ambulation/Gait                   Stairs             Wheelchair Mobility    Modified Rankin (Stroke Patients Only)       Balance Overall balance assessment: Needs assistance Sitting-balance support: Feet supported, Feet unsupported, Bilateral upper extremity supported Sitting balance-Leahy Scale: Fair Sitting balance - Comments: able to sit x 10 minutes with contact guard/min assist at times to correst post/left lean Postural control: Posterior lean, Left lateral lean Standing balance support: During functional activity, Bilateral upper extremity supported, Reliant on assistive device for balance Standing balance-Leahy Scale: Zero                              Cognition Arousal/Alertness: Lethargic Behavior During Therapy: WFL for tasks assessed/performed Overall Cognitive Status: Within Functional Limits for tasks assessed                                 General Comments:  increased time to process information        Exercises Other Exercises Other Exercises: supine PROM, some seated AROM but remains quite difficult for pt.  LAQ and adduction    General Comments        Pertinent Vitals/Pain Pain Assessment Pain Assessment: No/denies pain Pain Intervention(s): Monitored during session    Home Living                          Prior Function            PT Goals (current goals can now be found in the  care plan section) Progress towards PT goals: Progressing toward goals    Frequency    Min 3X/week      PT Plan Current plan remains appropriate    Co-evaluation              AM-PAC PT "6 Clicks" Mobility   Outcome Measure  Help needed turning from your back to your side while in a flat bed without using bedrails?: Total Help needed moving from lying on your back to sitting on the side of a flat bed without using bedrails?: Total Help needed moving to and from a bed to a chair (including a wheelchair)?: Total Help needed standing up from a chair using your arms (e.g., wheelchair or bedside chair)?: Total Help needed to walk in hospital room?: Total Help needed climbing 3-5 steps with a railing? : Total 6 Click Score: 6    End of Session   Activity Tolerance: Patient limited by fatigue;Patient limited by lethargy Patient left: in bed;with call bell/phone within reach;with bed alarm set Nurse Communication: Mobility status PT Visit Diagnosis: Muscle weakness (generalized) (M62.81);Hemiplegia and hemiparesis;Difficulty in walking, not elsewhere classified (R26.2);Other abnormalities of gait and mobility (R26.89);Unsteadiness on feet (R26.81) Hemiplegia - Right/Left: Right Hemiplegia - caused by: Cerebral infarction     Time: 1610-9604 PT Time Calculation (min) (ACUTE ONLY): 24 min  Charges:  $Therapeutic Exercise: 8-22 mins $Therapeutic Activity: 8-22 mins                   Danielle Dess, PTA 03/04/23, 11:09 AM

## 2023-03-04 NOTE — Progress Notes (Signed)
Insurance authorization started in [place] [id number])

## 2023-03-04 NOTE — Progress Notes (Signed)
Progress Note   Patient: [name] Gilbert UXN:[id number] DOB: 03-14-1961 DOA: 02/25/2023     6 DOS: the patient was seen and examined on 03/04/2023   Brief hospital course: 62 year old African-American female history significant for type 2 diabetes mellitus, stage III chronic kidney disease, seizure disorder, hypertension, previous CVA who presents to the ED with acute onset of expressive aphasia and right gaze deviation and left-sided weakness. MRI confirmed small acute infarct. Neurology consulted by admitting physician.    6/17: Notified by bedside RN that NIH score had increased.  Evaluated patient at bedside.  She seems to be flattened in affect.  Case discussed with neurology.  They feel patient is at neurologic baseline.  Will order head CT.  Echocardiogram completed and pending.   6/18: Repeat head CT without new issues.  Echocardiogram reassuring.  Bubble study negative. 6/19.  Will discontinue IV fluids and discontinue central line.  Encouraged to eat. 6/20.  Patient's husband states that she will not eat anything that is baked and he will have to bring in food.  I am okay with that. 6/22.  Patient's sugar was a little low this morning.  Will decrease long-acting insulin at night.  Sugar before lunch elevated. 6/23.  Patient worked with occupational therapy and then told me she did not feel well after that.  She drank an Ensure this morning.   Assessment and Plan: * Acute CVA (cerebrovascular accident) (HCC) - Continue aspirin and Plavix since the patient failed Plavix monotherapy..  Continue Crestor   Essential hypertension Continue Norvasc Toprol and Imdur.  Today's blood pressure better controlled.  Type II diabetes mellitus with renal manifestations (HCC) And also hyperglycemia.  Last A1c 8.7 a couple months ago.  With sugars elevated this afternoon I will increase long-acting insulin to 8 units in the evening with sugars high this afternoon.  Continue sliding scale.  CKD  (chronic kidney disease), stage IV (HCC) Creatinine 2.17 with a GFR of 25.  Patient encouraged to eat and drink since we lost IV access.  Dyslipidemia On Crestor 20 mg daily.  LDL 141  Seizure disorder (HCC) Continue Keppra.  Iron deficiency anemia Repeat hemoglobin up to 10.0  Liver cirrhosis (HCC) Seen on prior imaging.  Previous hepatitis B and C testing negative.  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        Subjective:   Physical Exam: Vitals:   03/03/23 2044 03/04/23 0405 03/04/23 0738 03/04/23 1513  BP: (!) 141/67 (!) 151/77 (!) 148/73 122/60  Pulse: 74 88 81 71  Resp: 16 16 16 18   Temp: 98.3 F (36.8 C) 97.6 F (36.4 C) 98.1 F (36.7 C) 98.1 F (36.7 C)  TempSrc: Oral Oral    SpO2: 100% 100% 100% 100%  Weight:      Height:       Physical Exam HENT:     Head: Normocephalic.     Mouth/Throat:     Pharynx: No oropharyngeal exudate.  Eyes:     General: Lids are normal.     Conjunctiva/sclera: Conjunctivae normal.  Cardiovascular:     Rate and Rhythm: Normal rate and regular rhythm.     Heart sounds: Normal heart sounds, S1 normal and S2 normal.  Pulmonary:     Breath sounds: No decreased breath sounds, wheezing, rhonchi or rales.  Abdominal:     Palpations: Abdomen is soft.     Tenderness: There is no abdominal tenderness.  Musculoskeletal:     Right lower  leg: No swelling.     Left lower leg: No swelling.  Skin:    General: Skin is warm.     Findings: No rash.  Neurological:     Mental Status: She is alert and oriented to person, place, and time.     Comments: Right arm 4+ out of 5 power.  Left arm 4+ out of 5 power.      Data Reviewed: Last cr 2.17 Family Communication: Spoke with husband on the phone  Disposition: Status is: Inpatient Remains inpatient appropriate because:  Planned Discharge Destination: Home    Time spent: 28 minutes  Author: Alford Highland, MD 03/04/2023 4:06  PM  For on call review [email or web address].

## 2023-03-04 NOTE — Plan of Care (Signed)

## 2023-03-05 DIAGNOSIS — I639 Cerebral infarction, unspecified: Secondary | ICD-10-CM | POA: Diagnosis not present

## 2023-03-05 DIAGNOSIS — I10 Essential (primary) hypertension: Secondary | ICD-10-CM | POA: Diagnosis not present

## 2023-03-05 DIAGNOSIS — E1122 Type 2 diabetes mellitus with diabetic chronic kidney disease: Secondary | ICD-10-CM | POA: Diagnosis not present

## 2023-03-05 DIAGNOSIS — N184 Chronic kidney disease, stage 4 (severe): Secondary | ICD-10-CM | POA: Diagnosis not present

## 2023-03-05 LAB — BASIC METABOLIC PANEL
Anion gap: 7 (ref 5–15)
BUN: 28 mg/dL — ABNORMAL HIGH (ref 8–23)
CO2: 23 mmol/L (ref 22–32)
Calcium: 8.7 mg/dL — ABNORMAL LOW (ref 8.9–10.3)
Chloride: 109 mmol/L (ref 98–111)
Creatinine, Ser: 1.99 mg/dL — ABNORMAL HIGH (ref 0.44–1.00)
GFR, Estimated: 28 mL/min — ABNORMAL LOW (ref >60)
Glucose, Bld: 190 mg/dL — ABNORMAL HIGH (ref 70–99)
Potassium: 4.9 mmol/L (ref 3.5–5.1)
Sodium: 139 mmol/L (ref 135–145)

## 2023-03-05 LAB — CBC
HCT: 32.6 % — ABNORMAL LOW (ref 36.0–46.0)
Hemoglobin: 10.7 g/dL — ABNORMAL LOW (ref 12.0–15.0)
MCH: 29 pg (ref 26.0–34.0)
MCHC: 32.8 g/dL (ref 30.0–36.0)
MCV: 88.3 fL (ref 80.0–100.0)
Platelets: 281 10*3/uL (ref 150–400)
RBC: 3.69 MIL/uL — ABNORMAL LOW (ref 3.87–5.11)
RDW: 15.3 % (ref 11.5–15.5)
WBC: 5.7 10*3/uL (ref 4.0–10.5)
nRBC: 0 % (ref 0.0–0.2)

## 2023-03-05 LAB — GLUCOSE, CAPILLARY
Glucose-Capillary: 197 mg/dL — ABNORMAL HIGH (ref 70–99)
Glucose-Capillary: 198 mg/dL — ABNORMAL HIGH (ref 70–99)
Glucose-Capillary: 287 mg/dL — ABNORMAL HIGH (ref 70–99)
Glucose-Capillary: 330 mg/dL — ABNORMAL HIGH (ref 70–99)
Glucose-Capillary: 152 mg/dL — ABNORMAL HIGH (ref 70–99)

## 2023-03-05 NOTE — Care Management Important Message (Signed)
Important Message  Patient Details  Name: [name] Gilbert MRN: [id number] Date of Birth: [birth year]   Medicare Important Message Given:  N/A - LOS <3 / Initial given by admissions     [name] 03/05/2023, 8:58 AM

## 2023-03-05 NOTE — TOC Progression Note (Signed)
Transition of Care [facility]) - Progression Note    Patient Details  Name: [name] Gilbert MRN: [id number] Date of Birth: 1961/04/15  Transition of Care [facility]) CM/SW Contact  Allena Katz, LCSW Phone Number: 03/05/2023, 2:50 PM  Clinical Narrative:   [facility] still pending.    Expected Discharge Plan: Skilled Nursing Facility Barriers to Discharge: Continued Medical Work up  Expected Discharge Plan and Services                                               Social Determinants of Health (SDOH) Interventions SDOH Screenings   Food Insecurity: No Food Insecurity (02/25/2023)  Housing: Low Risk  (02/25/2023)  Transportation Needs: No Transportation Needs (02/25/2023)  Utilities: Not At Risk (02/25/2023)  Tobacco Use: Low Risk  (02/25/2023)    Readmission Risk Interventions     No data to display

## 2023-03-05 NOTE — Progress Notes (Signed)
Progress Note   Patient: [name] Gilbert ZOX:[id number] DOB: [birth year] DOA: 02/25/2023     7 DOS: the patient was seen and examined on 03/05/2023   Brief hospital course: 62 year old African-American female history significant for type 2 diabetes mellitus, stage III chronic kidney disease, seizure disorder, hypertension, previous CVA who presents to the ED with acute onset of expressive aphasia and right gaze deviation and left-sided weakness. MRI confirmed small acute infarct. Neurology consulted by admitting physician.    6/17: Notified by bedside RN that NIH score had increased.  Evaluated patient at bedside.  She seems to be flattened in affect.  Case discussed with neurology.  They feel patient is at neurologic baseline.  Will order head CT.  Echocardiogram completed and pending.   6/18: Repeat head CT without new issues.  Echocardiogram reassuring.  Bubble study negative. 6/19.  Will discontinue IV fluids and discontinue central line.  Encouraged to eat. 6/20.  Patient's husband states that she will not eat anything that is baked and he will have to bring in food.  I am okay with that. 6/22.  Patient's sugar was a little low this morning.  Will decrease long-acting insulin at night.  Sugar before lunch elevated. 6/23.  Patient worked with occupational therapy and then told me she did not feel well after that.  She drank an Ensure this morning. 6/24.  Awaiting insurance authorization for rehab.   Assessment and Plan: * Acute CVA (cerebrovascular accident) (HCC) - Continue aspirin and Plavix since the patient failed Plavix monotherapy..  Continue Crestor   Essential hypertension Continue Norvasc Toprol and Imdur.  Today's blood pressure better controlled.  Type II diabetes mellitus with renal manifestations (HCC) And also hyperglycemia.  Last A1c 8.7 a couple months ago.  Sugars are variable depending on how much she eats.  Continue long-acting insulin to 8 units in the evening with  sugars high this afternoon.  Continue sliding scale.  CKD (chronic kidney disease), stage IV (HCC) Creatinine 1.99 with a GFR of 28.  Patient encouraged to eat and drink since we lost IV access.  Dyslipidemia On Crestor 20 mg daily.  LDL 141  Seizure disorder (HCC) Continue Keppra.  Iron deficiency anemia Repeat hemoglobin up to 10.7  Liver cirrhosis (HCC) Seen on prior imaging.  Previous hepatitis B and C testing negative.  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        Subjective: Patient feels okay.  Husband at the bedside this morning.  States she ate some breakfast and drink some fruit drink.  Patient admitted with stroke.  Physical Exam: Vitals:   03/04/23 2051 03/05/23 0443 03/05/23 0833 03/05/23 1200  BP: (!) 155/81 (!) 154/60 (!) 131/54 120/60  Pulse: 80 72 67 60  Resp: 18 16 19 18   Temp: 97.6 F (36.4 C) 98.1 F (36.7 C) 97.7 F (36.5 C) 98 F (36.7 C)  TempSrc:  Oral  Oral  SpO2: 100% 100% 100% 98%  Weight:      Height:       Physical Exam HENT:     Head: Normocephalic.     Mouth/Throat:     Pharynx: No oropharyngeal exudate.  Eyes:     General: Lids are normal.     Conjunctiva/sclera: Conjunctivae normal.  Cardiovascular:     Rate and Rhythm: Normal rate and regular rhythm.     Heart sounds: Normal heart sounds, S1 normal and S2 normal.  Pulmonary:     Breath sounds: No  decreased breath sounds, wheezing, rhonchi or rales.  Abdominal:     Palpations: Abdomen is soft.     Tenderness: There is no abdominal tenderness.  Musculoskeletal:     Right lower leg: No swelling.     Left lower leg: No swelling.  Skin:    General: Skin is warm.     Findings: No rash.  Neurological:     Mental Status: She is alert and oriented to person, place, and time.     Comments: Right arm 4+ out of 5 power.  Left arm 4+ out of 5 power.      Data Reviewed: Creatinine 1.99, hemoglobin 10.7  Family Communication: Spoke  with husband at the bedside  Disposition: Status is: Inpatient Remains inpatient appropriate because: Awaiting insurance authorization for rehab.  Medically stable.  Planned Discharge Destination: Rehab    Time spent: 27 minutes Case discussed with nursing staff and TOC.  Author: Alford Highland, MD 03/05/2023 3:32 PM  For on call review [email or web address].

## 2023-03-05 NOTE — Progress Notes (Signed)
Occupational Therapy Treatment Patient Details Name: [name] Gilbert MRN: [id number] DOB: [birth year] Today's Date: 03/05/2023   History of present illness Pt is a 62 y/o F admitted on 02/25/23 after presenting with acut onset of expressive aphasia & R gaze preference with L sided weakness. Pt is being treated for acute CVA. PMH: DM2, CKD 3B, seizure disorder, HTN, CVA   OT comments  Pt seen for OT treatment on this date. Upon arrival to room pt was resting in bed, agreeable to tx. Pt requires MOD A for feeding tasks. MIN A for drinking with physical and verbal cueing. Cognition and ability to follow directions was difficult to assess, complicated by no verbal communication and limited gestures. Pt ate a few bites of food using a tripod pinch and drank through a straw. Pt making progress toward goals, will continue to follow POC. Discharge recommendation remains appropriate.     Recommendations for follow up therapy are one component of a multi-disciplinary discharge planning process, led by the attending physician.  Recommendations may be updated based on patient status, additional functional criteria and insurance authorization.    Assistance Recommended at Discharge Frequent or constant Supervision/Assistance  Patient can return home with the following  Two people to help with walking and/or transfers;Two people to help with bathing/dressing/bathroom;Assistance with cooking/housework;Assistance with feeding;Direct supervision/assist for medications management;Direct supervision/assist for financial management;Assist for transportation;Help with stairs or ramp for entrance   Equipment Recommendations  Other (comment) (defer to next venue of care)    Recommendations for Other Services      Precautions / Restrictions Precautions Precautions: Fall Precaution Comments: hx R hemi, new L weakness Restrictions Weight Bearing Restrictions: No       Mobility Bed Mobility Overal bed mobility:  Needs Assistance Bed Mobility: Rolling Rolling: Mod assist (MOD A for positioning and propping with pillows.)              Transfers                         Balance Overall balance assessment: Needs assistance       Postural control: Posterior lean, Left lateral lean                                 ADL either performed or assessed with clinical judgement   ADL Overall ADL's : Needs assistance/impaired Eating/Feeding: Cueing for sequencing;Moderate assistance Eating/Feeding Details (indicate cue type and reason): Pt was set up with food on lap. Pt was able to bring drink to mouth with straw. Physical and verbal prompting to bring pieces of toast to mouth with LUE in a tripod pinch. Pt declined to use RUE. However, through the session pt demonstrated ability to lift RUE to mouth as well.                                   General ADL Comments: Anticipate pt to require MAX-Total assist due to UE and LE physical deficits as well as cognitive deficits.    Extremity/Trunk Assessment Upper Extremity Assessment Upper Extremity Assessment: Generalized weakness RUE Deficits / Details: Increased tone, limited cordination. Pt able to touch hand to mouth.            Vision       Perception     Praxis      Cognition Arousal/Alertness: Awake/alert  Behavior During Therapy: Flat affect Overall Cognitive Status: Difficult to assess                                 General Comments: No attempts to verbalize but does wave good bye as rehab team leaves room.        Exercises      Shoulder Instructions       General Comments      Pertinent Vitals/ Pain       Pain Assessment Pain Assessment: No/denies pain  Home Living                                          Prior Functioning/Environment              Frequency  Min 2X/week        Progress Toward Goals  OT Goals(current goals can now  be found in the care plan section)  Progress towards OT goals: Progressing toward goals  Acute Rehab OT Goals Patient Stated Goal: unable to state OT Goal Formulation: Patient unable to participate in goal setting Time For Goal Achievement: 03/11/23 Potential to Achieve Goals: Fair  Plan Discharge plan remains appropriate;Frequency remains appropriate    Co-evaluation                 AM-PAC OT "6 Clicks" Daily Activity     Outcome Measure   Help from another person eating meals?: A Lot Help from another person taking care of personal grooming?: A Lot Help from another person toileting, which includes using toliet, bedpan, or urinal?: Total Help from another person bathing (including washing, rinsing, drying)?: A Lot Help from another person to put on and taking off regular upper body clothing?: A Lot Help from another person to put on and taking off regular lower body clothing?: Total 6 Click Score: 10    End of Session    OT Visit Diagnosis: Muscle weakness (generalized) (M62.81);Hemiplegia and hemiparesis Hemiplegia - Right/Left: Right   Activity Tolerance Patient tolerated treatment well   Patient Left with call bell/phone within reach;in chair;with chair alarm set   Nurse Communication          Time: 1610-9604 OT Time Calculation (min): 18 min  Charges: OT General Charges $OT Visit: 1 Visit OT Treatments $Self Care/Home Management : 8-22 mins  Thresa Ross, OTS

## 2023-03-05 NOTE — Progress Notes (Signed)
Physical Therapy Treatment Patient Details Name: [name] Gilbert MRN: [id number] DOB: 1960/09/18 Today's Date: 03/05/2023   History of Present Illness Pt is a 62 y/o F admitted on 02/25/23 after presenting with acut onset of expressive aphasia & R gaze preference with L sided weakness. Pt is being treated for acute CVA. PMH: DM2, CKD 3B, seizure disorder, HTN, CVA    PT Comments    Pt in bed, generally poor positioning with head resting on bottom bedrail while she sleeps.  Awoke pt and repositioned for comfort and safety.  She is transitioned to sitting with max a x 1 and is able to sit EOB with min a x 1.  She is able to stand today x 4 with mod a x 2 for up to 30 seconds each.  She needs max a to side step right foot but is able to step with left.  Remains generally weak but is improved over yesterday.  Assisted with Ensure drink and OT arrives in room at end of session to assist with breakfast.   Recommendations for follow up therapy are one component of a multi-disciplinary discharge planning process, led by the attending physician.  Recommendations may be updated based on patient status, additional functional criteria and insurance authorization.  Follow Up Recommendations       Assistance Recommended at Discharge Frequent or constant Supervision/Assistance  Patient can return home with the following Two people to help with bathing/dressing/bathroom;Two people to help with walking and/or transfers;Help with stairs or ramp for entrance;Direct supervision/assist for medications management;Assistance with feeding;Assist for transportation;Direct supervision/assist for financial management;Assistance with cooking/housework   Equipment Recommendations       Recommendations for Other Services       Precautions / Restrictions Precautions Precautions: Fall Precaution Comments: hx R hemi, new L weakness Restrictions Weight Bearing Restrictions: No     Mobility  Bed Mobility Overal  bed mobility: Needs Assistance Bed Mobility: Supine to Sit, Sit to Supine     Supine to sit: Max assist Sit to supine: Max assist   General bed mobility comments: MaxA to scoot to EOB Patient Response: Cooperative, Flat affect  Transfers Overall transfer level: Needs assistance Equipment used: Rolling walker (2 wheels) Transfers: Sit to/from Stand Sit to Stand: Mod assist, Max assist, +2 physical assistance           General transfer comment: stood x 4 at EOB for about 30 seconds each attempt    Ambulation/Gait Ambulation/Gait assistance: Mod assist, +2 physical assistance, Max assist Gait Distance (Feet): 1 Feet Assistive device: Rolling walker (2 wheels) Gait Pattern/deviations: Step-to pattern       General Gait Details: unable to take any independant steps.  does sidestep right with physical assist to place right leg then steps with left   Stairs             Wheelchair Mobility    Modified Rankin (Stroke Patients Only)       Balance Overall balance assessment: Needs assistance Sitting-balance support: Feet supported, Feet unsupported, Bilateral upper extremity supported Sitting balance-Leahy Scale: Fair Sitting balance - Comments: able to sit x 5 minutes with contact guard/min assist at times to correst post/left lean Postural control: Posterior lean, Left lateral lean   Standing balance-Leahy Scale: Poor Standing balance comment: +2 assistance with RW                            Cognition Arousal/Alertness: Awake/alert Behavior During Therapy: [facility] for  tasks assessed/performed, Flat affect Overall Cognitive Status: Difficult to assess                                 General Comments: no attempts to verbalize but does wave good bye as rehab tech leaves room        Exercises      General Comments        Pertinent Vitals/Pain Pain Assessment Pain Assessment: No/denies pain Pain Intervention(s): Monitored during  session    Home Living                          Prior Function            PT Goals (current goals can now be found in the care plan section) Progress towards PT goals: Progressing toward goals    Frequency    Min 3X/week      PT Plan Current plan remains appropriate    Co-evaluation              AM-PAC PT "6 Clicks" Mobility   Outcome Measure  Help needed turning from your back to your side while in a flat bed without using bedrails?: Total Help needed moving from lying on your back to sitting on the side of a flat bed without using bedrails?: Total Help needed moving to and from a bed to a chair (including a wheelchair)?: Total Help needed standing up from a chair using your arms (e.g., wheelchair or bedside chair)?: A Lot Help needed to walk in hospital room?: Total Help needed climbing 3-5 steps with a railing? : Total 6 Click Score: 7    End of Session Equipment Utilized During Treatment: Gait belt Activity Tolerance: Patient limited by fatigue;Patient tolerated treatment well Patient left: in bed;with call bell/phone within reach;with bed alarm set;with nursing/sitter in room Nurse Communication: Mobility status PT Visit Diagnosis: Muscle weakness (generalized) (M62.81);Hemiplegia and hemiparesis;Difficulty in walking, not elsewhere classified (R26.2);Other abnormalities of gait and mobility (R26.89);Unsteadiness on feet (R26.81) Hemiplegia - Right/Left: Right Hemiplegia - caused by: Cerebral infarction     Time: 1027-1040 PT Time Calculation (min) (ACUTE ONLY): 13 min  Charges:  $Therapeutic Activity: 8-22 mins                   Danielle Dess, PTA 03/05/23, 10:48 AM

## 2023-03-05 NOTE — Inpatient Diabetes Management (Signed)
Inpatient Diabetes Program Recommendations  AACE/ADA: New Consensus Statement on Inpatient Glycemic Control   Target Ranges:  Prepandial:   less than 140 mg/dL      Peak postprandial:   less than 180 mg/dL (1-2 hours)      Critically ill patients:  140 - 180 mg/dL    Latest Reference Range & Units 03/04/23 07:28 03/04/23 11:48 03/04/23 15:15 03/04/23 20:51 03/05/23 04:38 03/05/23 08:17 03/05/23 11:25  Glucose-Capillary 70 - 99 mg/dL 161 (H) 096 (H) 045 (H) 135 (H) 197 (H) 198 (H) 287 (H)   Review of Glycemic Control  Diabetes history: DM2 Outpatient Diabetes medications: Tresiba 10 units at bedtime, Novolog 5 units TID with meals Current orders for Inpatient glycemic control: Semglee 8 units at bedtime, Novolog 0-9 units TID with meals, Novolog 0-5 units QHS  Inpatient Diabetes Program Recommendations:    Diet: If appropriate, consider changing from Regular to Carb Modified diet.  Insulin: If post prandial glucose remains consistently over 180 mg/dl, please consider ordering Novolog 2 units TID with meals for meal coverage if patient eats at least 50% of meals.  Thanks, Orlando Penner, RN, MSN, CDCES Diabetes Coordinator Inpatient Diabetes Program [phone] (Team Pager from 8am to 5pm)

## 2023-03-05 NOTE — TOC Progression Note (Signed)
Transition of Care [facility]) - Progression Note    Patient Details  Name: Eleonore Shippee MRN: [id number] Date of Birth: Feb 12, 1961  Transition of Care [facility]) CM/SW Contact  Allena Katz, LCSW Phone Number: 03/05/2023, 10:52 AM  Clinical Narrative:   Updated PT notes uploaded to [facility].    Expected Discharge Plan: Skilled Nursing Facility Barriers to Discharge: Continued Medical Work up  Expected Discharge Plan and Services                                               Social Determinants of Health (SDOH) Interventions SDOH Screenings   Food Insecurity: No Food Insecurity (02/25/2023)  Housing: Low Risk  (02/25/2023)  Transportation Needs: No Transportation Needs (02/25/2023)  Utilities: Not At Risk (02/25/2023)  Tobacco Use: Low Risk  (02/25/2023)    Readmission Risk Interventions     No data to display

## 2023-03-06 DIAGNOSIS — I10 Essential (primary) hypertension: Secondary | ICD-10-CM | POA: Diagnosis not present

## 2023-03-06 DIAGNOSIS — N184 Chronic kidney disease, stage 4 (severe): Secondary | ICD-10-CM | POA: Diagnosis not present

## 2023-03-06 DIAGNOSIS — I639 Cerebral infarction, unspecified: Secondary | ICD-10-CM | POA: Diagnosis not present

## 2023-03-06 DIAGNOSIS — E1122 Type 2 diabetes mellitus with diabetic chronic kidney disease: Secondary | ICD-10-CM | POA: Diagnosis not present

## 2023-03-06 LAB — GLUCOSE, CAPILLARY
Glucose-Capillary: 209 mg/dL — ABNORMAL HIGH (ref 70–99)
Glucose-Capillary: 345 mg/dL — ABNORMAL HIGH (ref 70–99)
Glucose-Capillary: 358 mg/dL — ABNORMAL HIGH (ref 70–99)
Glucose-Capillary: 316 mg/dL — ABNORMAL HIGH (ref 70–99)
Glucose-Capillary: 168 mg/dL — ABNORMAL HIGH (ref 70–99)

## 2023-03-06 MED ORDER — INSULIN ASPART 100 UNIT/ML IJ SOLN
2.0000 [IU] | Freq: Three times a day (TID) | INTRAMUSCULAR | Status: DC
  Administered 2023-03-07 – 2023-03-12 (×17): 2 [IU] via SUBCUTANEOUS
  Filled 2023-03-06 (×16): qty 1

## 2023-03-06 NOTE — Progress Notes (Signed)
Nutrition Follow-up  DOCUMENTATION CODES:   Not applicable  INTERVENTION:   -Continue regular diet -Continue MVI with minerals daily -Continue Ensure Enlive po BID, each supplement provides 350 kcal and 20 grams of protein -Continue feeding assistance with meals -Encourage adequate PO intake; family can bring in outside food as desired  NUTRITION DIAGNOSIS:   Inadequate oral intake related to poor appetite as evidenced by meal completion < 50%.  Ongoing  GOAL:   Patient will meet greater than or equal to 90% of their needs  Progressing   MONITOR:   PO intake, Supplement acceptance  REASON FOR ASSESSMENT:   Low Braden    ASSESSMENT:   Pt with history significant for type 2 diabetes mellitus, stage III chronic kidney disease, seizure disorder, hypertension, previous CVA who presents with acute onset of expressive aphasia and right gaze deviation and left-sided weakness. MRI confirmed small acute infarct.  Reviewed I/O's: -550 ml x 24 hours and -4.9 L since admission  UOP: 550 ml x 24 hours   Pt sitting up in bed at time of visit. Pt made eye contact with this RD. No family present.   Pt remains with erratic intake. Noted meal completions 25-100%. Pt with limited acceptance of Ensure supplements.   No new wt since admission.   Per TOC notes, pt awaiting insurance authorization for SNF [facility]).   Medications reviewed and include B complex with vitamin C, keppra, miralax, senokot, and vitamin D.   Labs reviewed: CBGS: 152-209 (inpatient orders for glycemic control are 0-5 units insulin aspart daily at bedtime, 0-9 units insulin aspart TID with meals, and 8 units insulin glargine-yfgn daily at bedtime).  Noted DM coordinator recommendation for meal coverage (2 units insulin aspart TID with meals)  Diet Order:   Diet Order             Diet regular Fluid consistency: Thin  Diet effective now                   EDUCATION NEEDS:   Not appropriate for  education at this time  Skin:  Skin Assessment: Reviewed RN Assessment  Last BM:  03/05/23 (type 5)  Height:   Ht Readings from Last 1 Encounters:  02/25/23 5\' 2"  (1.575 m)    Weight:   Wt Readings from Last 1 Encounters:  02/25/23 63.5 kg    Ideal Body Weight:  50 kg  BMI:  Body mass index is 25.6 kg/m.  Estimated Nutritional Needs:   Kcal:  1700-1900  Protein:  85-100 grams  Fluid:  > 1.7 L    Levada Schilling, RD, LDN, CDCES Registered Dietitian II Certified Diabetes Care and Education Specialist Please refer to [facility] for RD and/or RD on-call/weekend/after hours pager

## 2023-03-06 NOTE — TOC Progression Note (Signed)
Transition of Care [facility]) - Progression Note    Patient Details  Name: [name] Gilbert MRN: [id number] Date of Birth: [birth year]  Transition of Care [facility]) CM/SW Contact  Allena Katz, LCSW Phone Number: 03/06/2023, 3:06 PM  Clinical Narrative:   CSW unable to restart auth earlier in day due to portal being inaccessible. Auth restarted via phone and fax with [facility] for [place] [facility].    Expected Discharge Plan: Skilled Nursing Facility Barriers to Discharge: Continued Medical Work up  Expected Discharge Plan and Services                                               Social Determinants of Health (SDOH) Interventions SDOH Screenings   Food Insecurity: No Food Insecurity (02/25/2023)  Housing: Low Risk  (02/25/2023)  Transportation Needs: No Transportation Needs (02/25/2023)  Utilities: Not At Risk (02/25/2023)  Tobacco Use: Low Risk  (02/25/2023)    Readmission Risk Interventions     No data to display

## 2023-03-06 NOTE — Inpatient Diabetes Management (Signed)
Inpatient Diabetes Program Recommendations  AACE/ADA: New Consensus Statement on Inpatient Glycemic Control   Target Ranges:  Prepandial:   less than 140 mg/dL      Peak postprandial:   less than 180 mg/dL (1-2 hours)      Critically ill patients:  140 - 180 mg/dL    Latest Reference Range & Units 03/05/23 08:17 03/05/23 11:25 03/05/23 17:07 03/05/23 19:46  Glucose-Capillary 70 - 99 mg/dL 562 (H) 130 (H) 865 (H) 152 (H)   Review of Glycemic Control  Diabetes history: DM2 Outpatient Diabetes medications: Tresiba 10 units at bedtime, Novolog 5 units TID with meals Current orders for Inpatient glycemic control: Semglee 8 units at bedtime, Novolog 0-9 units TID with meals, Novolog 0-5 units QHS   Inpatient Diabetes Program Recommendations:     Diet: If appropriate, consider changing from Regular to Carb Modified diet.   Insulin: If post prandial glucose remains consistently over 180 mg/dl, please consider ordering Novolog 2 units TID with meals for meal coverage if patient eats at least 50% of meals.  Thanks, Orlando Penner, RN, MSN, CDCES Diabetes Coordinator Inpatient Diabetes Program [phone] (Team Pager from 8am to 5pm)

## 2023-03-06 NOTE — Progress Notes (Signed)
Mobility Specialist - Progress Note   03/06/23 1258  Mobility  Activity Stood at bedside;Transferred from bed to chair  Level of Assistance Moderate assist, patient does 50-74%  Assistive Device Front wheel walker  Distance Ambulated (ft) 2 ft  Activity Response Tolerated well  $Mobility charge 1 Mobility  Mobility Specialist Start Time (ACUTE ONLY) 1240  Mobility Specialist Stop Time (ACUTE ONLY) 1252  Mobility Specialist Time Calculation (min) (ACUTE ONLY) 12 min   Pt supine upon entry, utilizing RA. Pt agreeable to stand EOB and take a few steps to the recliner this date. Pt completed bed mob ModA for trunk support and BLE-- L lateral lean noticed while supine. Pt STS to RW MinA +2, requiring foot blocking and VC's for hand placement on RW. Once standing MS sat Pt back EOB due to RLE turning/twisting upon STS. Pt STS a second time, this time transferring to the recliner CGA. Manual manipulation of the RW, vocal and tactile cueing for BLE required. Pt left seated in the recliner with alarm set and needs within reach. NT present at bedside.   Natlie Asfour Mobility Specialist 03/06/23 1:11 PM

## 2023-03-06 NOTE — Care Management Important Message (Signed)
Important Message  Patient Details  Name: [name] Gilbert MRN: [id number] Date of Birth: [birth year]   Medicare Important Message Given:  N/A - LOS <3 / Initial given by admissions     [name] 03/06/2023, 9:27 AM

## 2023-03-06 NOTE — Progress Notes (Signed)
Neurology saw patient and made there recommendations of aspirin, plavix and crestor and signed off as of 02/28/23.  No new neuological findings, so no need for neurological follow up while in hospital, the medical team follows to discharge. Patient has medically stable to go out to rehab and has been stable since 02/28/23.  Dr Alford Highland

## 2023-03-06 NOTE — Progress Notes (Signed)
Progress Note   Patient: [name] Gilbert UJW:[id number] DOB: Jan 25, 1961 DOA: 02/25/2023     8 DOS: the patient was seen and examined on 03/06/2023   Brief hospital course: 62 year old African-American female history significant for type 2 diabetes mellitus, stage III chronic kidney disease, seizure disorder, hypertension, previous CVA who presents to the ED with acute onset of expressive aphasia and right gaze deviation and left-sided weakness. MRI confirmed small acute infarct. Neurology consulted by admitting physician.    6/17: Notified by bedside RN that NIH score had increased.  Evaluated patient at bedside.  She seems to be flattened in affect.  Case discussed with neurology.  They feel patient is at neurologic baseline.  CT head did not show any acute intracranial abnormality.  Echocardiogram showed a normal EF and negative bubble study though the window was suboptimal.   6/18: Repeat head CT without new issues.  Echocardiogram reassuring.  Bubble study negative. 6/19.  Will discontinue IV fluids and discontinue central line.  Encouraged to eat. 6/20.  Patient's husband states that she will not eat anything that is baked and he will have to bring in food.  I am okay with that. 6/22.  Patient's sugar was a little low this morning.  Will decrease long-acting insulin at night.  Sugar before lunch elevated. 6/23.  Patient worked with occupational therapy and then told me she did not feel well after that.  She drank an Ensure this morning. 6/24.  Awaiting insurance authorization for rehab. 6/25.  Still awaiting insurance authorization for rehab.   Assessment and Plan: * Acute CVA (cerebrovascular accident) (HCC) - Continue aspirin and Plavix since the patient failed Plavix monotherapy..  Continue Crestor   Essential hypertension Continue Norvasc Toprol and Imdur.  Today's blood pressure better controlled.  Type II diabetes mellitus with renal manifestations (HCC) And also hyperglycemia.   Last A1c 8.7 a couple months ago.  Sugars are variable depending on how much she eats.  Continue long-acting insulin to 8 units in the evening.  Added 2 units of NovoLog plus continue sliding scale.  CKD (chronic kidney disease), stage IV (HCC) Creatinine 1.99 with a GFR of 28.  Patient encouraged to eat and drink since we lost IV access.  Dyslipidemia On Crestor 20 mg daily.  LDL 141  Seizure disorder (HCC) Continue Keppra.  Iron deficiency anemia Repeat hemoglobin up to 10.7  Liver cirrhosis (HCC) Seen on prior imaging.  Previous hepatitis B and C testing negative.  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        Subjective: Patient answers some simple yes or no questions.  Does not elaborate much.  Admitted with stroke.  Physical Exam: Vitals:   03/05/23 1944 03/06/23 0421 03/06/23 0746 03/06/23 0854  BP: 122/62 (!) 147/67 137/81 121/63  Pulse: 77 63 68 67  Resp: 18 18 18    Temp: 98.6 F (37 C) 97.8 F (36.6 C) 97.8 F (36.6 C)   TempSrc:  Oral    SpO2: 100% 98% 100%   Weight:      Height:       Physical Exam HENT:     Head: Normocephalic.     Mouth/Throat:     Pharynx: No oropharyngeal exudate.  Eyes:     General: Lids are normal.     Conjunctiva/sclera: Conjunctivae normal.  Cardiovascular:     Rate and Rhythm: Normal rate and regular rhythm.     Heart sounds: Normal heart sounds, S1 normal and S2  normal.  Pulmonary:     Breath sounds: No decreased breath sounds, wheezing, rhonchi or rales.  Abdominal:     Palpations: Abdomen is soft.     Tenderness: There is no abdominal tenderness.  Musculoskeletal:     Right lower leg: No swelling.     Left lower leg: No swelling.  Skin:    General: Skin is warm.     Findings: No rash.  Neurological:     Mental Status: She is alert.     Comments: Right arm 4 out of 5 power.  Left arm 4 out of 5 power.  Able to straight leg raise but both legs drift down to the bed and  unable to hold them up for 10 seconds.     Data Reviewed: Last creatinine 1.99, last hemoglobin 10.7  Family Communication: Spoke with husband at the bedside  Disposition: Status is: Inpatient Remains inpatient appropriate because: Transitional care team still working on English as a second language teacher.  Medically stable to go out to rehab once insurance authorization obtained. Planned Discharge Destination: Rehab    Time spent: 27 minutes Spoke with TOC 3 times today.  Author: Alford Highland, MD 03/06/2023 4:36 PM  For on call review [email or web address].

## 2023-03-07 DIAGNOSIS — I639 Cerebral infarction, unspecified: Secondary | ICD-10-CM | POA: Diagnosis not present

## 2023-03-07 LAB — GLUCOSE, CAPILLARY
Glucose-Capillary: 176 mg/dL — ABNORMAL HIGH (ref 70–99)
Glucose-Capillary: 227 mg/dL — ABNORMAL HIGH (ref 70–99)
Glucose-Capillary: 158 mg/dL — ABNORMAL HIGH (ref 70–99)
Glucose-Capillary: 168 mg/dL — ABNORMAL HIGH (ref 70–99)

## 2023-03-07 LAB — COMPREHENSIVE METABOLIC PANEL
ALT: 43 U/L (ref 0–44)
AST: 59 U/L — ABNORMAL HIGH (ref 15–41)
Albumin: 2.8 g/dL — ABNORMAL LOW (ref 3.5–5.0)
Alkaline Phosphatase: 90 U/L (ref 38–126)
Anion gap: 8 (ref 5–15)
BUN: 36 mg/dL — ABNORMAL HIGH (ref 8–23)
CO2: 22 mmol/L (ref 22–32)
Calcium: 8.5 mg/dL — ABNORMAL LOW (ref 8.9–10.3)
Chloride: 107 mmol/L (ref 98–111)
Creatinine, Ser: 1.96 mg/dL — ABNORMAL HIGH (ref 0.44–1.00)
GFR, Estimated: 28 mL/min — ABNORMAL LOW (ref >60)
Glucose, Bld: 198 mg/dL — ABNORMAL HIGH (ref 70–99)
Potassium: 5.6 mmol/L — ABNORMAL HIGH (ref 3.5–5.1)
Sodium: 137 mmol/L (ref 135–145)
Total Bilirubin: 0.3 mg/dL (ref 0.3–1.2)
Total Protein: 6.5 g/dL (ref 6.5–8.1)

## 2023-03-07 LAB — CBC
HCT: 28.2 % — ABNORMAL LOW (ref 36.0–46.0)
Hemoglobin: 9.6 g/dL — ABNORMAL LOW (ref 12.0–15.0)
MCH: 28.8 pg (ref 26.0–34.0)
MCHC: 34 g/dL (ref 30.0–36.0)
MCV: 84.7 fL (ref 80.0–100.0)
Platelets: 293 10*3/uL (ref 150–400)
RBC: 3.33 MIL/uL — ABNORMAL LOW (ref 3.87–5.11)
RDW: 14.9 % (ref 11.5–15.5)
WBC: 6 10*3/uL (ref 4.0–10.5)
nRBC: 0 % (ref 0.0–0.2)

## 2023-03-07 MED ORDER — METOPROLOL TARTRATE 25 MG PO TABS
12.5000 mg | ORAL_TABLET | Freq: Two times a day (BID) | ORAL | Status: DC
  Administered 2023-03-07 – 2023-03-12 (×11): 12.5 mg via ORAL
  Filled 2023-03-07 (×11): qty 1

## 2023-03-07 MED ORDER — ISOSORBIDE MONONITRATE 20 MG PO TABS
10.0000 mg | ORAL_TABLET | Freq: Three times a day (TID) | ORAL | Status: DC
  Administered 2023-03-07 – 2023-03-12 (×17): 10 mg via ORAL
  Filled 2023-03-07 (×18): qty 1

## 2023-03-07 NOTE — Progress Notes (Signed)
Occupational Therapy Treatment Patient Details Name: [name] Gilbert MRN: [id number] DOB: 10/19/1960 Today's Date: 03/07/2023   History of present illness Pt is a 62 y/o F admitted on 02/25/23 after presenting with acut onset of expressive aphasia & R gaze preference with L sided weakness. Pt is being treated for acute CVA. PMH: DM2, CKD 3B, seizure disorder, HTN, CVA   OT comments  Upon entering the room, pt supine in bed and agreeable to OT intervention with encouragement. Pt does not verbalize this session and only nods head "yes or no". OT providing self care items from the L visual field to encourage L visual scanning. Pt washing hands and face with increased time and cuing for thoroughness. Total A to don B socks. Pt follows commands with increased time to move B LEs towards EOB but then grimaces and points to buttocks. Pt rolls to the R with mod A for skin integrity check. No open areas notes but barrier cream applied and pt placed in side lying position. Call bell and all needed items within reach upon exiting the room.    Recommendations for follow up therapy are one component of a multi-disciplinary discharge planning process, led by the attending physician.  Recommendations may be updated based on patient status, additional functional criteria and insurance authorization.    Assistance Recommended at Discharge Frequent or constant Supervision/Assistance  Patient can return home with the following  Two people to help with walking and/or transfers;Two people to help with bathing/dressing/bathroom;Assistance with cooking/housework;Assistance with feeding;Direct supervision/assist for medications management;Direct supervision/assist for financial management;Assist for transportation;Help with stairs or ramp for entrance   Equipment Recommendations  Other (comment) (defer to next venue of care)       Precautions / Restrictions Precautions Precautions: Fall Precaution Comments: hx R hemi,  new L weakness       Mobility Bed Mobility Overal bed mobility: Needs Assistance Bed Mobility: Rolling Rolling: Mod assist              Transfers                             ADL either performed or assessed with clinical judgement   ADL Overall ADL's : Needs assistance/impaired     Grooming: Wash/dry hands;Wash/dry face;Supervision/safety;Set up                                        Cognition Arousal/Alertness: Awake/alert, Lethargic Behavior During Therapy: Flat affect Overall Cognitive Status: Difficult to assess                                 General Comments: Pt does not verbalize and only nods head "yes or no " during session.                   Pertinent Vitals/ Pain       Pain Assessment Pain Assessment: Faces Faces Pain Scale: Hurts even more Pain Location: buttocks Pain Descriptors / Indicators: Discomfort Pain Intervention(s): Monitored during session, Repositioned  Home Living Family/patient expects to be discharged to:: Skilled nursing facility  Frequency  Min 2X/week        Progress Toward Goals  OT Goals(current goals can now be found in the care plan section)  Progress towards OT goals: Progressing toward goals     Plan Discharge plan remains appropriate;Frequency remains appropriate       AM-PAC OT "6 Clicks" Daily Activity     Outcome Measure   Help from another person eating meals?: A Lot Help from another person taking care of personal grooming?: A Lot Help from another person toileting, which includes using toliet, bedpan, or urinal?: Total Help from another person bathing (including washing, rinsing, drying)?: A Lot Help from another person to put on and taking off regular upper body clothing?: A Lot Help from another person to put on and taking off regular lower body clothing?: Total 6 Click Score: 10    End of  Session Equipment Utilized During Treatment: Rolling walker (2 wheels)  OT Visit Diagnosis: Muscle weakness (generalized) (M62.81);Hemiplegia and hemiparesis Hemiplegia - Right/Left: Right   Activity Tolerance Patient limited by fatigue   Patient Left with call bell/phone within reach;in chair;with chair alarm set   Nurse Communication Mobility status        Time: 5956-3875 OT Time Calculation (min): 13 min  Charges: OT General Charges $OT Visit: 1 Visit OT Treatments $Therapeutic Activity: 8-22 mins  Jackquline Denmark, MS, OTR/L , CBIS ascom [phone]  03/07/23, 1:30 PM

## 2023-03-07 NOTE — TOC Progression Note (Signed)
Transition of Care [facility]) - Progression Note    Patient Details  Name: [name] Gilbert MRN: [id number] Date of Birth: [birth year]  Transition of Care [facility]) CM/SW Contact  Allena Katz, LCSW Phone Number: 03/07/2023, 2:26 PM  Clinical Narrative:    MD completed fast appeal. Records faxed over to [facility] for review.    Expected Discharge Plan: Skilled Nursing Facility Barriers to Discharge: Continued Medical Work up  Expected Discharge Plan and Services                                               Social Determinants of Health (SDOH) Interventions SDOH Screenings   Food Insecurity: No Food Insecurity (02/25/2023)  Housing: Low Risk  (02/25/2023)  Transportation Needs: No Transportation Needs (02/25/2023)  Utilities: Not At Risk (02/25/2023)  Tobacco Use: Low Risk  (02/25/2023)    Readmission Risk Interventions     No data to display

## 2023-03-07 NOTE — TOC Progression Note (Signed)
Transition of Care [facility]) - Progression Note    Patient Details  Name: [name] Gilbert MRN: [id number] Date of Birth: [birth year]  Transition of Care [facility]) CM/SW Contact  Allena Katz, LCSW Phone Number: 03/07/2023, 8:26 AM  Clinical Narrative:    Insurance requesting fast appeal. MD notified to call [phone] Once MD has completed CSW will fax clinicals to [phone].    Expected Discharge Plan: Skilled Nursing Facility Barriers to Discharge: Continued Medical Work up  Expected Discharge Plan and Services                                               Social Determinants of Health (SDOH) Interventions SDOH Screenings   Food Insecurity: No Food Insecurity (02/25/2023)  Housing: Low Risk  (02/25/2023)  Transportation Needs: No Transportation Needs (02/25/2023)  Utilities: Not At Risk (02/25/2023)  Tobacco Use: Low Risk  (02/25/2023)    Readmission Risk Interventions     No data to display

## 2023-03-07 NOTE — Progress Notes (Signed)
PROGRESS NOTE    [name] Gilbert  JWJ:[id number] DOB: 03-27-1961 DOA: 02/25/2023 PCP: Tobey Grim, MD    Assessment & Plan:   Principal Problem:   Acute CVA (cerebrovascular accident) [facility]) Active Problems:   Essential hypertension   Type II diabetes mellitus with renal manifestations (HCC)   CKD (chronic kidney disease), stage IV (HCC)   Dyslipidemia   Seizure disorder (HCC)   Iron deficiency anemia   Hypoglycemia   Expressive aphasia   Liver cirrhosis (HCC)  Assessment and Plan:  Acute CVA: continue on aspirin, plavix, statin as per neuro. Neuro recs apprec    HTN: continue on amlodipine, metoprolol, imdur   DM2: poorly controlled, HbA1c 8.7.  Continue on glargine, SSI w/ accuchecks    CKDIV: Cr is labile. Avoid nephrotoxic meds    HLD: continue on statin    Seizure disorder: continue on keppra   Normocytic anemia: H&H are labile. No need for a transfusion currently. Will check iron panel    Liver cirrhosis: seen on prior imaging. 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 Management per PCP outpatient       DVT prophylaxis: lovenox  Code Status: DNR Family Communication:  Disposition Plan: possibly d/c to SNF.  Level of care: Telemetry Medical  Status is: Inpatient Remains inpatient appropriate because: severity of illness    Consultants:  Neuro   Procedures:   Antimicrobials:   Subjective: Pt c/o malaise   Objective: Vitals:   03/06/23 1700 03/06/23 2013 03/07/23 0013 03/07/23 0455  BP: (!) 157/59 130/66 (!) 131/59 (!) 145/55  Pulse: 80 78 74 64  Resp: 18 19 18 20   Temp: 98.1 F (36.7 C) 98 F (36.7 C) 98.2 F (36.8 C) 97.7 F (36.5 C)  TempSrc:   Oral   SpO2: 100% 100% 100% 100%  Weight:      Height:        Intake/Output Summary (Last 24 hours) at 03/07/2023 0738 Last data filed at 03/07/2023 6213 Gross per 24 hour  Intake 800 ml  Output 2000 ml  Net -1200 ml   Filed Weights    02/25/23 2024  Weight: 63.5 kg    Examination:  General exam: Appears lethargic Respiratory system: decreased breath sounds b/l  Cardiovascular system: S1 & S2+. No rubs, gallops or clicks. Gastrointestinal system: Abdomen is nondistended, soft and nontender. Normal bowel sounds heard. Central nervous system: Alert and awake Psychiatry: Judgement and insight appears poor. Flat mood and affect.     Data Reviewed: I have personally reviewed following labs and imaging studies  CBC: Recent Labs  Lab 03/01/23 0512 03/03/23 0815 03/05/23 0315  WBC  --  5.1 5.7  HGB 10.4* 10.0* 10.7*  HCT  --  30.2* 32.6*  MCV  --  87.8 88.3  PLT  --  246 281   Basic Metabolic Panel: Recent Labs  Lab 03/01/23 0512 03/03/23 0815 03/05/23 0315  NA 139 139 139  K 4.1 4.2 4.9  CL 112* 109 109  CO2 20* 23 23  GLUCOSE 158* 117* 190*  BUN 17 25* 28*  CREATININE 2.08* 2.17* 1.99*  CALCIUM 8.6* 8.3* 8.7*   GFR: Estimated Creatinine Clearance: 25.4 mL/min (A) (by C-G formula based on SCr of 1.99 mg/dL (H)). Liver Function Tests: No results for input(s): "AST", "ALT", "ALKPHOS", "BILITOT", "PROT", "ALBUMIN" in the last 168 hours. No results for input(s): "LIPASE", "AMYLASE" in the last 168 hours. Recent Labs  Lab 03/03/23 0815  AMMONIA 16  Coagulation Profile: No results for input(s): "INR", "PROTIME" in the last 168 hours. Cardiac Enzymes: Recent Labs  Lab 03/01/23 0512  CKTOTAL 128   BNP (last 3 results) No results for input(s): "PROBNP" in the last 8760 hours. HbA1C: No results for input(s): "HGBA1C" in the last 72 hours. CBG: Recent Labs  Lab 03/06/23 0749 03/06/23 1209 03/06/23 1210 03/06/23 1701 03/06/23 2027  GLUCAP 209* 358* 345* 316* 168*   Lipid Profile: No results for input(s): "CHOL", "HDL", "LDLCALC", "TRIG", "CHOLHDL", "LDLDIRECT" in the last 72 hours. Thyroid Function Tests: No results for input(s): "TSH", "T4TOTAL", "FREET4", "T3FREE", "THYROIDAB" in  the last 72 hours. Anemia Panel: No results for input(s): "VITAMINB12", "FOLATE", "FERRITIN", "TIBC", "IRON", "RETICCTPCT" in the last 72 hours. Sepsis Labs: No results for input(s): "PROCALCITON", "LATICACIDVEN" in the last 168 hours.  No results found for this or any previous visit (from the past 240 hour(s)).       Radiology Studies: No results found.      Scheduled Meds:  amLODipine  10 mg Oral Daily   aspirin EC  81 mg Oral Daily   B-complex with vitamin C  1 tablet Oral Daily   clopidogrel  75 mg Oral Daily   enoxaparin (LOVENOX) injection  30 mg Subcutaneous Q24H   ezetimibe  10 mg Oral Daily   feeding supplement  237 mL Oral BID BM   insulin aspart  0-5 Units Subcutaneous QHS   insulin aspart  0-9 Units Subcutaneous TID WC   insulin aspart  2 Units Subcutaneous TID WC   insulin glargine-yfgn  8 Units Subcutaneous QHS   isosorbide mononitrate  30 mg Oral Daily   levETIRAcetam  250 mg Oral BID   metoprolol succinate  25 mg Oral Daily   polyethylene glycol  17 g Oral Daily   rosuvastatin  20 mg Oral QHS   senna-docusate  2 tablet Oral QHS   Vitamin D (Ergocalciferol)  50,000 Units Oral Q7 days   Continuous Infusions:   LOS: 9 days    Time spent: 25 mins    Charise Killian, MD [facility] Pager [phone]  If 7PM-7AM, please contact night-coverage [email or web address] 03/07/2023, 7:38 AM

## 2023-03-08 DIAGNOSIS — I639 Cerebral infarction, unspecified: Secondary | ICD-10-CM | POA: Diagnosis not present

## 2023-03-08 LAB — BASIC METABOLIC PANEL
Anion gap: 6 (ref 5–15)
BUN: 40 mg/dL — ABNORMAL HIGH (ref 8–23)
CO2: 24 mmol/L (ref 22–32)
Calcium: 8.9 mg/dL (ref 8.9–10.3)
Chloride: 106 mmol/L (ref 98–111)
Creatinine, Ser: 1.94 mg/dL — ABNORMAL HIGH (ref 0.44–1.00)
GFR, Estimated: 29 mL/min — ABNORMAL LOW (ref >60)
Glucose, Bld: 203 mg/dL — ABNORMAL HIGH (ref 70–99)
Potassium: 4.6 mmol/L (ref 3.5–5.1)
Sodium: 136 mmol/L (ref 135–145)

## 2023-03-08 LAB — GLUCOSE, CAPILLARY
Glucose-Capillary: 182 mg/dL — ABNORMAL HIGH (ref 70–99)
Glucose-Capillary: 249 mg/dL — ABNORMAL HIGH (ref 70–99)
Glucose-Capillary: 221 mg/dL — ABNORMAL HIGH (ref 70–99)
Glucose-Capillary: 193 mg/dL — ABNORMAL HIGH (ref 70–99)

## 2023-03-08 LAB — CBC
HCT: 31.4 % — ABNORMAL LOW (ref 36.0–46.0)
Hemoglobin: 10.2 g/dL — ABNORMAL LOW (ref 12.0–15.0)
MCH: 28.8 pg (ref 26.0–34.0)
MCHC: 32.5 g/dL (ref 30.0–36.0)
MCV: 88.7 fL (ref 80.0–100.0)
Platelets: 319 10*3/uL (ref 150–400)
RBC: 3.54 MIL/uL — ABNORMAL LOW (ref 3.87–5.11)
RDW: 15.2 % (ref 11.5–15.5)
WBC: 5.6 10*3/uL (ref 4.0–10.5)
nRBC: 0 % (ref 0.0–0.2)

## 2023-03-08 LAB — IRON AND TIBC
Iron: 52 ug/dL (ref 28–170)
Saturation Ratios: 18 % (ref 10.4–31.8)
TIBC: 288 ug/dL (ref 250–450)
UIBC: 236 ug/dL

## 2023-03-08 NOTE — TOC Progression Note (Addendum)
Transition of Care [facility]) - Progression Note    Patient Details  Name: [name] Gilbert MRN: [id number] Date of Birth: 1960/11/08  Transition of Care [facility]) CM/SW Contact  Allena Katz, LCSW Phone Number: 03/08/2023, 12:46 PM  Clinical Narrative:   CSW left voicemail with patients spouse regarding the appeal pending.   4:06pm CSW spoke with sally at [facility] who states they received the appeal with all 91 pages present. Kennon Rounds states the deadline for the appeal is July 29th.   Expected Discharge Plan: Skilled Nursing Facility Barriers to Discharge: Continued Medical Work up  Expected Discharge Plan and Services                                               Social Determinants of Health (SDOH) Interventions SDOH Screenings   Food Insecurity: No Food Insecurity (02/25/2023)  Housing: Low Risk  (02/25/2023)  Transportation Needs: No Transportation Needs (02/25/2023)  Utilities: Not At Risk (02/25/2023)  Tobacco Use: Low Risk  (02/25/2023)    Readmission Risk Interventions     No data to display

## 2023-03-08 NOTE — Progress Notes (Signed)
Mobility Specialist - Progress Note  03/08/23 1017  Mobility  Activity Stood at bedside;Transferred from bed to chair  Level of Assistance Maximum assist, patient does 25-49%  Assistive Device Front wheel walker  Distance Ambulated (ft) 2 ft  Activity Response Tolerated well  $Mobility charge 1 Mobility  Mobility Specialist Start Time (ACUTE ONLY) 0950  Mobility Specialist Stop Time (ACUTE ONLY) 1016  Mobility Specialist Time Calculation (min) (ACUTE ONLY) 26 min   Pt in fowler position upon entry, utilizing RA. Pt alert and responsive, agreeable to OOB transfer to the recliner. Pt completed bed MaxA +1 for trunk support and BLE-- unable to self support trunk once seated EOB this date. Pt STS to RW MaxA, foot blocking and VC's for weight shift and RW navigation. Pt transferred to the recliner via stand pivot transfer, tolerated well. Pt left seated in the recliner with alarm set and needs within reach. RN/NT notified.   [name] Gilbert Mobility Specialist 03/08/23 10:22 AM

## 2023-03-08 NOTE — Progress Notes (Signed)
PROGRESS NOTE    [name] Gilbert  ZOX:[id number] DOB: 1960-09-14 DOA: 02/25/2023 PCP: Tobey Grim, MD    Assessment & Plan:   Principal Problem:   Acute CVA (cerebrovascular accident) [facility]) Active Problems:   Essential hypertension   Type II diabetes mellitus with renal manifestations (HCC)   CKD (chronic kidney disease), stage IV (HCC)   Dyslipidemia   Seizure disorder (HCC)   Iron deficiency anemia   Hypoglycemia   Expressive aphasia   Liver cirrhosis (HCC)  Assessment and Plan:  Acute CVA: continue on plavix, aspirin, statin as per neuro. Neuro recs apprec    HTN: continue on metoprolol, amlodipine, imdur   DM2: HbA1c 8.7, poorly controlled. Continue on glargine, SSI w/ accuchecks    CKDIV: Cr is trending slightly today. Will continue to monitor    HLD: continue on statin    Seizure disorder: continue on keppra    Normocytic anemia: H&H are trending up today. Iron is WNL. No need for a transfusion currently   Liver cirrhosis: seen on prior imaging. 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 Management per PCP outpatient       DVT prophylaxis: lovenox  Code Status: DNR Family Communication:  Disposition Plan: possibly d/c to SNF.  Level of care: Telemetry Medical  Status is: Inpatient Remains inpatient appropriate because: insurance requested fast appeal as per CM. Fast appeal was done by myself and waiting to hear final decision from pt's insurance     Consultants:  Neuro   Procedures:   Antimicrobials:   Subjective: Pt c/o fatigue   Objective: Vitals:   03/07/23 1553 03/07/23 2040 03/08/23 0010 03/08/23 0445  BP: (!) 114/102 131/62 126/67 (!) 126/54  Pulse: 66 70 68 62  Resp: 18 18 16 18   Temp: (!) 97.5 F (36.4 C) 99.3 F (37.4 C) 98.7 F (37.1 C) 98.8 F (37.1 C)  TempSrc:   Oral Oral  SpO2: 100% 100% 100% 100%  Weight:      Height:        Intake/Output Summary (Last 24 hours)  at 03/08/2023 0751 Last data filed at 03/07/2023 2123 Gross per 24 hour  Intake 120 ml  Output 750 ml  Net -630 ml   Filed Weights   02/25/23 2024  Weight: 63.5 kg    Examination:  General exam: appears comfortable  Respiratory system: clear breath sounds b/l  Cardiovascular system: S1/S2+. No rubs or clicks  Gastrointestinal system: Abd is soft, NT, ND & hypoactive bowel sounds Central nervous system: alert & awake Psychiatry: Judgement and insight appears poor. Flat mood and affect     Data Reviewed: I have personally reviewed following labs and imaging studies  CBC: Recent Labs  Lab 03/03/23 0815 03/05/23 0315 03/07/23 0818 03/08/23 0335  WBC 5.1 5.7 6.0 5.6  HGB 10.0* 10.7* 9.6* 10.2*  HCT 30.2* 32.6* 28.2* 31.4*  MCV 87.8 88.3 84.7 88.7  PLT 246 281 293 319   Basic Metabolic Panel: Recent Labs  Lab 03/03/23 0815 03/05/23 0315 03/07/23 0818 03/08/23 0335  NA 139 139 137 136  K 4.2 4.9 5.6* 4.6  CL 109 109 107 106  CO2 23 23 22 24   GLUCOSE 117* 190* 198* 203*  BUN 25* 28* 36* 40*  CREATININE 2.17* 1.99* 1.96* 1.94*  CALCIUM 8.3* 8.7* 8.5* 8.9   GFR: Estimated Creatinine Clearance: 26 mL/min (A) (by C-G formula based on SCr of 1.94 mg/dL (H)). Liver Function Tests: Recent Labs  Lab 03/07/23 0818  AST 59*  ALT 43  ALKPHOS 90  BILITOT 0.3  PROT 6.5  ALBUMIN 2.8*   No results for input(s): "LIPASE", "AMYLASE" in the last 168 hours. Recent Labs  Lab 03/03/23 0815  AMMONIA 16   Coagulation Profile: No results for input(s): "INR", "PROTIME" in the last 168 hours. Cardiac Enzymes: No results for input(s): "CKTOTAL", "CKMB", "CKMBINDEX", "TROPONINI" in the last 168 hours.  BNP (last 3 results) No results for input(s): "PROBNP" in the last 8760 hours. HbA1C: No results for input(s): "HGBA1C" in the last 72 hours. CBG: Recent Labs  Lab 03/06/23 2027 03/07/23 0845 03/07/23 1108 03/07/23 1611 03/07/23 2040  GLUCAP 168* 176* 227* 158* 168*    Lipid Profile: No results for input(s): "CHOL", "HDL", "LDLCALC", "TRIG", "CHOLHDL", "LDLDIRECT" in the last 72 hours. Thyroid Function Tests: No results for input(s): "TSH", "T4TOTAL", "FREET4", "T3FREE", "THYROIDAB" in the last 72 hours. Anemia Panel: Recent Labs    03/08/23 0335  TIBC 288  IRON 52   Sepsis Labs: No results for input(s): "PROCALCITON", "LATICACIDVEN" in the last 168 hours.  No results found for this or any previous visit (from the past 240 hour(s)).       Radiology Studies: No results found.      Scheduled Meds:  amLODipine  10 mg Oral Daily   aspirin EC  81 mg Oral Daily   B-complex with vitamin C  1 tablet Oral Daily   clopidogrel  75 mg Oral Daily   enoxaparin (LOVENOX) injection  30 mg Subcutaneous Q24H   ezetimibe  10 mg Oral Daily   feeding supplement  237 mL Oral BID BM   insulin aspart  0-5 Units Subcutaneous QHS   insulin aspart  0-9 Units Subcutaneous TID WC   insulin aspart  2 Units Subcutaneous TID WC   insulin glargine-yfgn  8 Units Subcutaneous QHS   isosorbide mononitrate  10 mg Oral TID   levETIRAcetam  250 mg Oral BID   metoprolol tartrate  12.5 mg Oral BID   polyethylene glycol  17 g Oral Daily   rosuvastatin  20 mg Oral QHS   senna-docusate  2 tablet Oral QHS   Vitamin D (Ergocalciferol)  50,000 Units Oral Q7 days   Continuous Infusions:   LOS: 10 days    Time spent: 25 mins    Charise Killian, MD [facility] Pager [phone]  If 7PM-7AM, please contact night-coverage [email or web address] 03/08/2023, 7:51 AM

## 2023-03-08 NOTE — TOC Progression Note (Signed)
Transition of Care [facility]) - Progression Note    Patient Details  Name: [name] Gilbert MRN: [id number] Date of Birth: [birth year]  Transition of Care [facility]) CM/SW Contact  Allena Katz, LCSW Phone Number: 03/08/2023, 10:12 AM  Clinical Narrative:   CSW spoke with representative at [facility] who reported that she only received 8 notes indicating the notes did not completely send. CSW will continue to refax.     Expected Discharge Plan: Skilled Nursing Facility Barriers to Discharge: Continued Medical Work up  Expected Discharge Plan and Services                                               Social Determinants of Health (SDOH) Interventions SDOH Screenings   Food Insecurity: No Food Insecurity (02/25/2023)  Housing: Low Risk  (02/25/2023)  Transportation Needs: No Transportation Needs (02/25/2023)  Utilities: Not At Risk (02/25/2023)  Tobacco Use: Low Risk  (02/25/2023)    Readmission Risk Interventions     No data to display

## 2023-03-09 DIAGNOSIS — I639 Cerebral infarction, unspecified: Secondary | ICD-10-CM | POA: Diagnosis not present

## 2023-03-09 LAB — CBC
HCT: 28.7 % — ABNORMAL LOW (ref 36.0–46.0)
Hemoglobin: 9.5 g/dL — ABNORMAL LOW (ref 12.0–15.0)
MCH: 29.3 pg (ref 26.0–34.0)
MCHC: 33.1 g/dL (ref 30.0–36.0)
MCV: 88.6 fL (ref 80.0–100.0)
Platelets: 294 10*3/uL (ref 150–400)
RBC: 3.24 MIL/uL — ABNORMAL LOW (ref 3.87–5.11)
RDW: 14.9 % (ref 11.5–15.5)
WBC: 5.4 10*3/uL (ref 4.0–10.5)
nRBC: 0 % (ref 0.0–0.2)

## 2023-03-09 LAB — BASIC METABOLIC PANEL
Anion gap: 7 (ref 5–15)
BUN: 46 mg/dL — ABNORMAL HIGH (ref 8–23)
CO2: 25 mmol/L (ref 22–32)
Calcium: 8.8 mg/dL — ABNORMAL LOW (ref 8.9–10.3)
Chloride: 108 mmol/L (ref 98–111)
Creatinine, Ser: 1.87 mg/dL — ABNORMAL HIGH (ref 0.44–1.00)
GFR, Estimated: 30 mL/min — ABNORMAL LOW (ref >60)
Glucose, Bld: 256 mg/dL — ABNORMAL HIGH (ref 70–99)
Potassium: 4.8 mmol/L (ref 3.5–5.1)
Sodium: 140 mmol/L (ref 135–145)

## 2023-03-09 LAB — GLUCOSE, CAPILLARY
Glucose-Capillary: 267 mg/dL — ABNORMAL HIGH (ref 70–99)
Glucose-Capillary: 214 mg/dL — ABNORMAL HIGH (ref 70–99)
Glucose-Capillary: 334 mg/dL — ABNORMAL HIGH (ref 70–99)
Glucose-Capillary: 211 mg/dL — ABNORMAL HIGH (ref 70–99)

## 2023-03-09 NOTE — Progress Notes (Signed)
PROGRESS NOTE    [name] Gilbert  RUE:[id number] DOB: [birth year] DOA: 02/25/2023 PCP: [name] Grim, MD    Assessment & Plan:   Principal Problem:   Acute CVA (cerebrovascular accident) [facility]) Active Problems:   Essential hypertension   Type II diabetes mellitus with renal manifestations (HCC)   CKD (chronic kidney disease), stage IV (HCC)   Dyslipidemia   Seizure disorder (HCC)   Iron deficiency anemia   Hypoglycemia   Expressive aphasia   Liver cirrhosis (HCC)  Assessment and Plan:  Acute CVA: continue on aspirin, statin, plavix as per neuro. Neuro recs apprec    HTN: continue on imdur, metoprolol, amlodipine   DM2: poorly controlled, HbA1c 8.7. Continue on glargine, SSI w/ accuchecks    CKDIV: Cr is trending down again today. Avoid nephrotoxic meds     HLD: continue on statin    Seizure disorder: continue on keppra    Normocytic anemia: H&H are labile. No need for a transfusion currently. Iron is WNL   Liver cirrhosis: seen on prior imaging. 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 Management per PCP outpatient       DVT prophylaxis: lovenox  Code Status: DNR Family Communication: discussed pt's care w/ pt's husband, [name] Maduro, and answered his questions  Disposition Plan: possibly d/c to SNF.  Level of care: Telemetry Medical  Status is: Inpatient Remains inpatient appropriate because: insurance requested fast appeal as per CM. Still waiting to hear back from pt's insurance in regards to the fast appeal     Consultants:  Neuro   Procedures:   Antimicrobials:   Subjective: Pt c/o malaise   Objective: Vitals:   03/08/23 2215 03/08/23 2339 03/09/23 0510 03/09/23 0722  BP: 128/61 138/63 (!) 122/52 (!) 99/57  Pulse: 77 80 69 70  Resp:  12 18 16   Temp:  98.3 F (36.8 C) 98.7 F (37.1 C) 97.8 F (36.6 C)  TempSrc:  Oral Oral   SpO2:  100% 99% 97%  Weight:      Height:        Intake/Output  Summary (Last 24 hours) at 03/09/2023 0802 Last data filed at 03/08/2023 2009 Gross per 24 hour  Intake 240 ml  Output 700 ml  Net -460 ml   Filed Weights   02/25/23 2024  Weight: 63.5 kg    Examination:  General exam: appears calm & comfortable  Respiratory system: clear breath sounds b/l  Cardiovascular system: S1 & S2+. No rubs or clicks  Gastrointestinal system: abd is soft, NT, ND & normal bowel sounds  Central nervous system: Alert & awake  Psychiatry: Judgement and insight appears poor. Flat mood and affect    Data Reviewed: I have personally reviewed following labs and imaging studies  CBC: Recent Labs  Lab 03/03/23 0815 03/05/23 0315 03/07/23 0818 03/08/23 0335 03/09/23 0350  WBC 5.1 5.7 6.0 5.6 5.4  HGB 10.0* 10.7* 9.6* 10.2* 9.5*  HCT 30.2* 32.6* 28.2* 31.4* 28.7*  MCV 87.8 88.3 84.7 88.7 88.6  PLT 246 281 293 319 294   Basic Metabolic Panel: Recent Labs  Lab 03/03/23 0815 03/05/23 0315 03/07/23 0818 03/08/23 0335 03/09/23 0350  NA 139 139 137 136 140  K 4.2 4.9 5.6* 4.6 4.8  CL 109 109 107 106 108  CO2 23 23 22 24 25   GLUCOSE 117* 190* 198* 203* 256*  BUN 25* 28* 36* 40* 46*  CREATININE 2.17* 1.99* 1.96* 1.94* 1.87*  CALCIUM 8.3* 8.7* 8.5* 8.9 8.8*  GFR: Estimated Creatinine Clearance: 27 mL/min (A) (by C-G formula based on SCr of 1.87 mg/dL (H)). Liver Function Tests: Recent Labs  Lab 03/07/23 0818  AST 59*  ALT 43  ALKPHOS 90  BILITOT 0.3  PROT 6.5  ALBUMIN 2.8*   No results for input(s): "LIPASE", "AMYLASE" in the last 168 hours. Recent Labs  Lab 03/03/23 0815  AMMONIA 16   Coagulation Profile: No results for input(s): "INR", "PROTIME" in the last 168 hours. Cardiac Enzymes: No results for input(s): "CKTOTAL", "CKMB", "CKMBINDEX", "TROPONINI" in the last 168 hours.  BNP (last 3 results) No results for input(s): "PROBNP" in the last 8760 hours. HbA1C: No results for input(s): "HGBA1C" in the last 72 hours. CBG: Recent  Labs  Lab 03/07/23 2040 03/08/23 0845 03/08/23 1216 03/08/23 1613 03/08/23 2041  GLUCAP 168* 182* 249* 221* 193*   Lipid Profile: No results for input(s): "CHOL", "HDL", "LDLCALC", "TRIG", "CHOLHDL", "LDLDIRECT" in the last 72 hours. Thyroid Function Tests: No results for input(s): "TSH", "T4TOTAL", "FREET4", "T3FREE", "THYROIDAB" in the last 72 hours. Anemia Panel: Recent Labs    03/08/23 0335  TIBC 288  IRON 52   Sepsis Labs: No results for input(s): "PROCALCITON", "LATICACIDVEN" in the last 168 hours.  No results found for this or any previous visit (from the past 240 hour(s)).       Radiology Studies: No results found.      Scheduled Meds:  amLODipine  10 mg Oral Daily   aspirin EC  81 mg Oral Daily   B-complex with vitamin C  1 tablet Oral Daily   clopidogrel  75 mg Oral Daily   enoxaparin (LOVENOX) injection  30 mg Subcutaneous Q24H   ezetimibe  10 mg Oral Daily   feeding supplement  237 mL Oral BID BM   insulin aspart  0-5 Units Subcutaneous QHS   insulin aspart  0-9 Units Subcutaneous TID WC   insulin aspart  2 Units Subcutaneous TID WC   insulin glargine-yfgn  8 Units Subcutaneous QHS   isosorbide mononitrate  10 mg Oral TID   levETIRAcetam  250 mg Oral BID   metoprolol tartrate  12.5 mg Oral BID   polyethylene glycol  17 g Oral Daily   rosuvastatin  20 mg Oral QHS   senna-docusate  2 tablet Oral QHS   Vitamin D (Ergocalciferol)  50,000 Units Oral Q7 days   Continuous Infusions:   LOS: 11 days    Time spent: 25 mins    [name] Killian, MD [facility] Pager [phone]  If 7PM-7AM, please contact night-coverage [email or web address] 03/09/2023, 8:02 AM

## 2023-03-09 NOTE — Inpatient Diabetes Management (Signed)
Inpatient Diabetes Program Recommendations  AACE/ADA: New Consensus Statement on Inpatient Glycemic Control (2015)  Target Ranges:  Prepandial:   less than 140 mg/dL      Peak postprandial:   less than 180 mg/dL (1-2 hours)      Critically ill patients:  140 - 180 mg/dL    Latest Reference Range & Units 03/08/23 08:45 03/08/23 12:16 03/08/23 16:13 03/08/23 20:41  Glucose-Capillary 70 - 99 mg/dL 782 (H)  4 units Novolog  249 (H)  5 units Novolog  221 (H)  5 units Novolog  193 (H)    8 units Semglee @2223   (H): Data is abnormally high  Latest Reference Range & Units 03/09/23 08:05  Glucose-Capillary 70 - 99 mg/dL 956 (H)  (H): Data is abnormally high    Home DM Meds: Tresiba 10 units QPM      Novolog 5 units TID with meals  Current Orders: Semglee 8 units QPM  Novolog 0-9 units TID ac/hs  Novolog 2 units TID with meals     MD- Note CBG elevations yest afternoon and again this AM  Please consider:  1. Increase Semglee to 12 units at bedtime  2. Increase Novolog Meal Coverage to 4 units TID with meals    --Will follow patient during hospitalization--  Ambrose Finland RN, MSN, CDCES Diabetes Coordinator Inpatient Glycemic Control Team Team Pager: [phone] (8a-5p)

## 2023-03-09 NOTE — Progress Notes (Signed)
PT Cancellation Note  Patient Details Name: [name] Gilbert MRN: [id number] DOB: [birth year]   Cancelled Treatment:     Pt declined PT this date. No reason offered. Education provided regarding benefits of PT and mobility. Will re-attempt next available date/time per POC.   Jannet Askew 03/09/2023, 12:10 PM

## 2023-03-09 NOTE — Care Management Important Message (Signed)
Important Message  Patient Details  Name: [name] Gilbert MRN: [id number] Date of Birth: [birth year]   Medicare Important Message Given:  Yes     [name] [name] 03/09/2023, 1:14 PM

## 2023-03-09 NOTE — Progress Notes (Signed)
Physical Therapy Treatment Patient Details Name: [name] Gilbert MRN: [id number] DOB: 1961-08-30 Today's Date: 03/09/2023   History of Present Illness Pt is a 62 y/o F admitted on 02/25/23 after presenting with acut onset of expressive aphasia & R gaze preference with L sided weakness. Pt is being treated for acute CVA. PMH: DM2, CKD 3B, seizure disorder, HTN, CVA    PT Comments    Pt received in bed after lunch, husband at bedside, pt motivated to get OOB. Supine to sit with HOB raised and use of Left side rail with Mod/MinA, with increased time, pt able to complete with primary assist to scoot to EOB. Once sitting, pt able to maintain balance with B UE/LE supported, cues to correct cervical flexion. Pt tolerated ~5 minutes EOB without LOB. Pt completed lateral trunk bends (down on elbows) with ability to self return to upright sitting each time. Sit<>stand from raised bed with ModA to RW. Pt able to side step shuffle with ModA for balance and walker management to bedside chair. MinA for weight shifting, B knees hyper extended to prevent buckling. Pt positioned in chair to comfort with all needs met. Great progress this date, pt cooperative and benefiting from skilled PT services. Will continue per POC.   Recommendations for follow up therapy are one component of a multi-disciplinary discharge planning process, led by the attending physician.  Recommendations may be updated based on patient status, additional functional criteria and insurance authorization.  Follow Up Recommendations  Can patient physically be transported by private vehicle: No    Assistance Recommended at Discharge Frequent or constant Supervision/Assistance  Patient can return home with the following Two people to help with bathing/dressing/bathroom;Two people to help with walking and/or transfers;Help with stairs or ramp for entrance;Assist for transportation;Direct supervision/assist for financial management   Equipment  Recommendations  None recommended by PT    Recommendations for Other Services       Precautions / Restrictions Precautions Precautions: Fall Precaution Comments: hx R hemi, new L weakness Restrictions Weight Bearing Restrictions: No     Mobility  Bed Mobility Overal bed mobility: Needs Assistance Bed Mobility: Supine to Sit     Supine to sit: Mod assist, HOB elevated     General bed mobility comments:  (Mod/MinA for supine to sit with HOB raised and use of rail. ModA to scoot to EOB once sitting)    Transfers Overall transfer level: Needs assistance Equipment used: Rolling walker (2 wheels) Transfers: Sit to/from Stand Sit to Stand: Mod assist, From elevated surface           General transfer comment: stood x 4 at EOB for about 30 seconds each attempt    Ambulation/Gait Ambulation/Gait assistance: Mod assist Gait Distance (Feet): 2 Feet Assistive device: Rolling walker (2 wheels) Gait Pattern/deviations: Step-to pattern, Shuffle, Knee hyperextension - right, Knee hyperextension - left Gait velocity: decr     General Gait Details:  (Pt able to weight shift with assist and advance several steps to bedside chair on Left side. ModA to reposition RW with each step)   Stairs             Wheelchair Mobility    Modified Rankin (Stroke Patients Only)       Balance Overall balance assessment: Needs assistance Sitting-balance support: Feet supported, Feet unsupported, Bilateral upper extremity supported Sitting balance-Leahy Scale: Fair Sitting balance - Comments: able to sit x 5 minutes with Supervision, no LOB   Standing balance support: During functional activity, Bilateral upper extremity  supported, Reliant on assistive device for balance Standing balance-Leahy Scale: Poor Standing balance comment:  (ModA for dynamic mobility with support of RW)                            Cognition Arousal/Alertness: Awake/alert, Lethargic Behavior  During Therapy: WFL for tasks assessed/performed Overall Cognitive Status: Within Functional Limits for tasks assessed                                 General Comments: Husband at bedside        Exercises General Exercises - Lower Extremity Ankle Circles/Pumps: AAROM, Both, 10 reps Long Arc Quad: AROM, Both, 10 reps, Seated Other Exercises Other Exercises:  (Seated lateral trunk bends down on elbows x 5 with CGA and verbal/visual cues)    General Comments General comments (skin integrity, edema, etc.):  (Discussed POC, role of PT and current functional progress with pt and pt's husband)      Pertinent Vitals/Pain Pain Assessment Pain Assessment: No/denies pain    Home Living                          Prior Function            PT Goals (current goals can now be found in the care plan section) Acute Rehab PT Goals Patient Stated Goal: Go home Progress towards PT goals: Progressing toward goals    Frequency    Min 3X/week      PT Plan Current plan remains appropriate    Co-evaluation              AM-PAC PT "6 Clicks" Mobility   Outcome Measure  Help needed turning from your back to your side while in a flat bed without using bedrails?: Total Help needed moving from lying on your back to sitting on the side of a flat bed without using bedrails?: Total Help needed moving to and from a bed to a chair (including a wheelchair)?: Total Help needed standing up from a chair using your arms (e.g., wheelchair or bedside chair)?: A Lot Help needed to walk in hospital room?: A Lot Help needed climbing 3-5 steps with a railing? 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

## 2023-03-09 NOTE — Plan of Care (Signed)

## 2023-03-10 ENCOUNTER — Inpatient Hospital Stay: Payer: Medicare HMO

## 2023-03-10 DIAGNOSIS — I639 Cerebral infarction, unspecified: Secondary | ICD-10-CM | POA: Diagnosis not present

## 2023-03-10 LAB — CBC
HCT: 29.3 % — ABNORMAL LOW (ref 36.0–46.0)
Hemoglobin: 9.6 g/dL — ABNORMAL LOW (ref 12.0–15.0)
MCH: 29 pg (ref 26.0–34.0)
MCHC: 32.8 g/dL (ref 30.0–36.0)
MCV: 88.5 fL (ref 80.0–100.0)
Platelets: 290 10*3/uL (ref 150–400)
RBC: 3.31 MIL/uL — ABNORMAL LOW (ref 3.87–5.11)
RDW: 15.1 % (ref 11.5–15.5)
WBC: 5.4 10*3/uL (ref 4.0–10.5)
nRBC: 0 % (ref 0.0–0.2)

## 2023-03-10 LAB — GLUCOSE, CAPILLARY
Glucose-Capillary: 193 mg/dL — ABNORMAL HIGH (ref 70–99)
Glucose-Capillary: 155 mg/dL — ABNORMAL HIGH (ref 70–99)
Glucose-Capillary: 302 mg/dL — ABNORMAL HIGH (ref 70–99)
Glucose-Capillary: 73 mg/dL (ref 70–99)
Glucose-Capillary: 82 mg/dL (ref 70–99)

## 2023-03-10 LAB — BASIC METABOLIC PANEL
Anion gap: 5 (ref 5–15)
BUN: 52 mg/dL — ABNORMAL HIGH (ref 8–23)
CO2: 25 mmol/L (ref 22–32)
Calcium: 8.8 mg/dL — ABNORMAL LOW (ref 8.9–10.3)
Chloride: 108 mmol/L (ref 98–111)
Creatinine, Ser: 1.73 mg/dL — ABNORMAL HIGH (ref 0.44–1.00)
GFR, Estimated: 33 mL/min — ABNORMAL LOW (ref >60)
Glucose, Bld: 179 mg/dL — ABNORMAL HIGH (ref 70–99)
Potassium: 4.4 mmol/L (ref 3.5–5.1)
Sodium: 138 mmol/L (ref 135–145)

## 2023-03-10 NOTE — TOC Progression Note (Signed)
Transition of Care [facility]) - Progression Note    Patient Details  Name: [name] Gilbert MRN: [id number] Date of Birth: [birth year]  [facility] CM/SW Contact  Liliana Cline, LCSW Phone Number: 03/10/2023, 9:37 AM  Clinical Narrative:    Call to [facility] at [phone] to attempt to follow up on appeal status. Message states they are closed and will reopen during normal business hours. Left VM requesting call with update on appeal status.    Expected Discharge Plan: Skilled Nursing Facility Barriers to Discharge: Continued Medical Work up  Expected Discharge Plan and Services                                               Social Determinants of Health (SDOH) Interventions SDOH Screenings   Food Insecurity: No Food Insecurity (02/25/2023)  Housing: Low Risk  (02/25/2023)  Transportation Needs: No Transportation Needs (02/25/2023)  Utilities: Not At Risk (02/25/2023)  Tobacco Use: Low Risk  (02/25/2023)    Readmission Risk Interventions     No data to display

## 2023-03-10 NOTE — Plan of Care (Signed)
  Problem: Ischemic Stroke/TIA Tissue Perfusion: Goal: Complications of ischemic stroke/TIA will be minimized Outcome: Progressing   Problem: Coping: Goal: Will verbalize positive feelings about self Outcome: Progressing   Problem: Nutrition: Goal: Risk of aspiration will decrease Outcome: Progressing   Problem: Coping: Goal: Ability to adjust to condition or change in health will improve Outcome: Progressing   Problem: Fluid Volume: Goal: Ability to maintain a balanced intake and output will improve Outcome: Progressing   Problem: Metabolic: Goal: Ability to maintain appropriate glucose levels will improve Outcome: Progressing

## 2023-03-10 NOTE — Progress Notes (Signed)
PROGRESS NOTE    [name] Gilbert  NFA:[id number] DOB: 1961-04-20 DOA: 02/25/2023 PCP: Tobey Grim, MD    Assessment & Plan:   Principal Problem:   Acute CVA (cerebrovascular accident) [facility]) Active Problems:   Essential hypertension   Type II diabetes mellitus with renal manifestations (HCC)   CKD (chronic kidney disease), stage IV (HCC)   Dyslipidemia   Seizure disorder (HCC)   Iron deficiency anemia   Hypoglycemia   Expressive aphasia   Liver cirrhosis (HCC)  Assessment and Plan:  Acute CVA: continue on statin, plavix, aspirin as per neuro. Increased NIH score of 19 as per pt's nurse. Repeat CT head shows stable non contrast CT appearance of the brain from earlier this month, no acute intracranial abnormalities. Neuro recs apprec    HTN: continue on amlodipine, imdur, metoprolol   DM2: HbA1c 8.7, poorly controlled. Continue on glargine, SSI w/ accuchecks    CKDIV: Cr is trending down again today. Avoid nephrotoxic meds     HLD: continue on statin    Seizure disorder: continue on keppra    Normocytic anemia: H&H are stable. Iron is WNL. Will transfuse if Hb < 7.0    Liver cirrhosis: seen on prior imaging. 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 Management per PCP outpatient       DVT prophylaxis: lovenox  Code Status: DNR Family Communication: discussed pt's care w/ pt's husband, [name], and answered his questions  Disposition Plan: possibly d/c to SNF.  Level of care: Telemetry Medical  Status is: Inpatient Remains inpatient appropriate because: insurance requested fast appeal as per CM. Still waiting to hear back from pt's insurance in regards to the fast appeal     Consultants:  Neuro   Procedures:   Antimicrobials:   Subjective: Pt is lethargic.   Objective: Vitals:   03/09/23 0804 03/09/23 1541 03/09/23 1937 03/10/23 0407  BP: 136/62 (!) 143/75 135/67 134/62  Pulse: 72 74 95 70  Resp: 17  16 16 16   Temp: 97.9 F (36.6 C) 97.8 F (36.6 C) 98.1 F (36.7 C) 98.6 F (37 C)  TempSrc:    Oral  SpO2: 99% 100% 100% 100%  Weight:      Height:        Intake/Output Summary (Last 24 hours) at 03/10/2023 0745 Last data filed at 03/09/2023 2118 Gross per 24 hour  Intake --  Output 450 ml  Net -450 ml   Filed Weights   02/25/23 2024  Weight: 63.5 kg    Examination:  General exam: appears lethargic  Respiratory system: clear breath sounds b/l. No wheezes  Cardiovascular system: S1/S2+. No rubs or clicks  Gastrointestinal system: abd is soft, NT, ND & hypoactive bowel sounds  Central nervous system: lethargic  Psychiatry: Judgement and insight is poor. Flat mood and affect     Data Reviewed: I have personally reviewed following labs and imaging studies  CBC: Recent Labs  Lab 03/05/23 0315 03/07/23 0818 03/08/23 0335 03/09/23 0350 03/10/23 0355  WBC 5.7 6.0 5.6 5.4 5.4  HGB 10.7* 9.6* 10.2* 9.5* 9.6*  HCT 32.6* 28.2* 31.4* 28.7* 29.3*  MCV 88.3 84.7 88.7 88.6 88.5  PLT 281 293 319 294 290   Basic Metabolic Panel: Recent Labs  Lab 03/05/23 0315 03/07/23 0818 03/08/23 0335 03/09/23 0350 03/10/23 0355  NA 139 137 136 140 138  K 4.9 5.6* 4.6 4.8 4.4  CL 109 107 106 108 108  CO2 23 22 24 25  25  GLUCOSE 190* 198* 203* 256* 179*  BUN 28* 36* 40* 46* 52*  CREATININE 1.99* 1.96* 1.94* 1.87* 1.73*  CALCIUM 8.7* 8.5* 8.9 8.8* 8.8*   GFR: Estimated Creatinine Clearance: 29.2 mL/min (A) (by C-G formula based on SCr of 1.73 mg/dL (H)). Liver Function Tests: Recent Labs  Lab 03/07/23 0818  AST 59*  ALT 43  ALKPHOS 90  BILITOT 0.3  PROT 6.5  ALBUMIN 2.8*   No results for input(s): "LIPASE", "AMYLASE" in the last 168 hours. Recent Labs  Lab 03/03/23 0815  AMMONIA 16   Coagulation Profile: No results for input(s): "INR", "PROTIME" in the last 168 hours. Cardiac Enzymes: No results for input(s): "CKTOTAL", "CKMB", "CKMBINDEX", "TROPONINI" in the last  168 hours.  BNP (last 3 results) No results for input(s): "PROBNP" in the last 8760 hours. HbA1C: No results for input(s): "HGBA1C" in the last 72 hours. CBG: Recent Labs  Lab 03/08/23 2041 03/09/23 0805 03/09/23 1135 03/09/23 1543 03/09/23 2042  GLUCAP 193* 267* 214* 334* 211*   Lipid Profile: No results for input(s): "CHOL", "HDL", "LDLCALC", "TRIG", "CHOLHDL", "LDLDIRECT" in the last 72 hours. Thyroid Function Tests: No results for input(s): "TSH", "T4TOTAL", "FREET4", "T3FREE", "THYROIDAB" in the last 72 hours. Anemia Panel: Recent Labs    03/08/23 0335  TIBC 288  IRON 52   Sepsis Labs: No results for input(s): "PROCALCITON", "LATICACIDVEN" in the last 168 hours.  No results found for this or any previous visit (from the past 240 hour(s)).       Radiology Studies: No results found.      Scheduled Meds:  amLODipine  10 mg Oral Daily   aspirin EC  81 mg Oral Daily   B-complex with vitamin C  1 tablet Oral Daily   clopidogrel  75 mg Oral Daily   enoxaparin (LOVENOX) injection  30 mg Subcutaneous Q24H   ezetimibe  10 mg Oral Daily   feeding supplement  237 mL Oral BID BM   insulin aspart  0-5 Units Subcutaneous QHS   insulin aspart  0-9 Units Subcutaneous TID WC   insulin aspart  2 Units Subcutaneous TID WC   insulin glargine-yfgn  8 Units Subcutaneous QHS   isosorbide mononitrate  10 mg Oral TID   levETIRAcetam  250 mg Oral BID   metoprolol tartrate  12.5 mg Oral BID   polyethylene glycol  17 g Oral Daily   rosuvastatin  20 mg Oral QHS   senna-docusate  2 tablet Oral QHS   Vitamin D (Ergocalciferol)  50,000 Units Oral Q7 days   Continuous Infusions:   LOS: 12 days    Time spent: 25 mins    Charise Killian, MD [facility] Pager [phone]  If 7PM-7AM, please contact night-coverage [email or web address] 03/10/2023, 7:45 AM

## 2023-03-10 NOTE — Progress Notes (Signed)
Patient presents with decreased alertness and difficulty increasing LOC.  Patient oriented to self only and difficulty following directions.  NIH increased from 9 to 19.  Dr Mayford Knife notified with follow up CT ordered.  Patient transported for CT scan in bed and returned in bed in stable condition.

## 2023-03-10 NOTE — Progress Notes (Signed)
OT Cancellation Note  Patient Details Name: Daedra Wickes MRN: [id number] DOB: [birth year]   Cancelled Treatment:    Attempted to see pt earlier, nursing reports difficulty arousing and decreased LOC, pt for head CT.  Will hold for CT results and continue to monitor for participation in therapy.  Nimisha Rathel T Saharsh Sterling, OTR/L, CLT    Rika Daughdrill 03/10/2023, 12:58 PM

## 2023-03-11 DIAGNOSIS — I639 Cerebral infarction, unspecified: Secondary | ICD-10-CM | POA: Diagnosis not present

## 2023-03-11 LAB — GLUCOSE, CAPILLARY
Glucose-Capillary: 221 mg/dL — ABNORMAL HIGH (ref 70–99)
Glucose-Capillary: 273 mg/dL — ABNORMAL HIGH (ref 70–99)
Glucose-Capillary: 280 mg/dL — ABNORMAL HIGH (ref 70–99)
Glucose-Capillary: 220 mg/dL — ABNORMAL HIGH (ref 70–99)

## 2023-03-11 LAB — CBC
HCT: 28.7 % — ABNORMAL LOW (ref 36.0–46.0)
Hemoglobin: 9.2 g/dL — ABNORMAL LOW (ref 12.0–15.0)
MCH: 28.7 pg (ref 26.0–34.0)
MCHC: 32.1 g/dL (ref 30.0–36.0)
MCV: 89.4 fL (ref 80.0–100.0)
Platelets: 277 10*3/uL (ref 150–400)
RBC: 3.21 MIL/uL — ABNORMAL LOW (ref 3.87–5.11)
RDW: 15.2 % (ref 11.5–15.5)
WBC: 5.6 10*3/uL (ref 4.0–10.5)
nRBC: 0.4 % — ABNORMAL HIGH (ref 0.0–0.2)

## 2023-03-11 LAB — BASIC METABOLIC PANEL
Anion gap: 8 (ref 5–15)
BUN: 54 mg/dL — ABNORMAL HIGH (ref 8–23)
CO2: 24 mmol/L (ref 22–32)
Calcium: 8.5 mg/dL — ABNORMAL LOW (ref 8.9–10.3)
Chloride: 105 mmol/L (ref 98–111)
Creatinine, Ser: 1.93 mg/dL — ABNORMAL HIGH (ref 0.44–1.00)
GFR, Estimated: 29 mL/min — ABNORMAL LOW (ref >60)
Glucose, Bld: 226 mg/dL — ABNORMAL HIGH (ref 70–99)
Potassium: 4.8 mmol/L (ref 3.5–5.1)
Sodium: 137 mmol/L (ref 135–145)

## 2023-03-11 MED ORDER — DOCUSATE SODIUM 100 MG PO CAPS
200.0000 mg | ORAL_CAPSULE | Freq: Two times a day (BID) | ORAL | Status: DC
  Administered 2023-03-11 – 2023-03-12 (×3): 200 mg via ORAL
  Filled 2023-03-11 (×3): qty 2

## 2023-03-11 NOTE — Progress Notes (Signed)
PROGRESS NOTE    [name] Gilbert  ZOX:[id number] DOB: [birth year] DOA: 02/25/2023 PCP: Tobey Grim, MD    Assessment & Plan:   Principal Problem:   Acute CVA (cerebrovascular accident) [facility]) Active Problems:   Essential hypertension   Type II diabetes mellitus with renal manifestations (HCC)   CKD (chronic kidney disease), stage IV (HCC)   Dyslipidemia   Seizure disorder (HCC)   Iron deficiency anemia   Hypoglycemia   Expressive aphasia   Liver cirrhosis (HCC)  Assessment and Plan:  Acute CVA: continue on aspirin, statin, plavix as per neuro. Repeat CT head shows no acute intracranial abnormalities     HTN: continue on imdur, amlodipine, metoprolol   DM2: poorly controlled, HbA1c 8.7. Continue on glargine, SSI w/ accuchecks    CKDIV: Cr is labile. Avoid nephrotoxic meds    HLD: continue on statin    Seizure disorder: continue on keppra    Normocytic anemia: H&H are labile. No need for a transfusion currently     Liver cirrhosis: seen on prior imaging. 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 Management per PCP outpatient       DVT prophylaxis: lovenox  Code Status: DNR Family Communication: discussed pt's care w/ pt's husband, [name], and answered his questions  Disposition Plan: possibly d/c to SNF.  Level of care: Telemetry Medical  Status is: Inpatient Remains inpatient appropriate because: insurance requested fast appeal as per CM. Still waiting to hear back from pt's insurance in regards to the fast appeal     Consultants:  Neuro   Procedures:   Antimicrobials:   Subjective: Pt c/o fatigue   Objective: Vitals:   03/10/23 1004 03/10/23 1546 03/10/23 2005 03/11/23 0452  BP: (!) 148/67 131/65 (!) 120/59 (!) 115/57  Pulse: 67 76 81 62  Resp:  16 16 16   Temp:  98.7 F (37.1 C) 98.2 F (36.8 C) 97.9 F (36.6 C)  TempSrc:      SpO2: 100% 100% 100% 100%  Weight:      Height:       No  intake or output data in the 24 hours ending 03/11/23 0742  Filed Weights   02/25/23 2024  Weight: 63.5 kg    Examination:  General exam: appears awake & alert  Respiratory system: clear breath sounds b/l  Cardiovascular system: S1 & S2+. No rubs or gallops Gastrointestinal system: Abd is soft, NT, ND & normal bowel sounds Central nervous system: alert & awake Psychiatry: judgement and insight appears poor. Flat mood and affect     Data Reviewed: I have personally reviewed following labs and imaging studies  CBC: Recent Labs  Lab 03/07/23 0818 03/08/23 0335 03/09/23 0350 03/10/23 0355 03/11/23 0434  WBC 6.0 5.6 5.4 5.4 5.6  HGB 9.6* 10.2* 9.5* 9.6* 9.2*  HCT 28.2* 31.4* 28.7* 29.3* 28.7*  MCV 84.7 88.7 88.6 88.5 89.4  PLT 293 319 294 290 277   Basic Metabolic Panel: Recent Labs  Lab 03/07/23 0818 03/08/23 0335 03/09/23 0350 03/10/23 0355 03/11/23 0434  NA 137 136 140 138 137  K 5.6* 4.6 4.8 4.4 4.8  CL 107 106 108 108 105  CO2 22 24 25 25 24   GLUCOSE 198* 203* 256* 179* 226*  BUN 36* 40* 46* 52* 54*  CREATININE 1.96* 1.94* 1.87* 1.73* 1.93*  CALCIUM 8.5* 8.9 8.8* 8.8* 8.5*   GFR: Estimated Creatinine Clearance: 26.1 mL/min (A) (by C-G formula based on SCr of 1.93 mg/dL (H)). Liver Function  Tests: Recent Labs  Lab 03/07/23 0818  AST 59*  ALT 43  ALKPHOS 90  BILITOT 0.3  PROT 6.5  ALBUMIN 2.8*   No results for input(s): "LIPASE", "AMYLASE" in the last 168 hours. No results for input(s): "AMMONIA" in the last 168 hours.  Coagulation Profile: No results for input(s): "INR", "PROTIME" in the last 168 hours. Cardiac Enzymes: No results for input(s): "CKTOTAL", "CKMB", "CKMBINDEX", "TROPONINI" in the last 168 hours.  BNP (last 3 results) No results for input(s): "PROBNP" in the last 8760 hours. HbA1C: No results for input(s): "HGBA1C" in the last 72 hours. CBG: Recent Labs  Lab 03/10/23 0807 03/10/23 1152 03/10/23 1626 03/10/23 2009  03/10/23 2157  GLUCAP 193* 155* 302* 73 82   Lipid Profile: No results for input(s): "CHOL", "HDL", "LDLCALC", "TRIG", "CHOLHDL", "LDLDIRECT" in the last 72 hours. Thyroid Function Tests: No results for input(s): "TSH", "T4TOTAL", "FREET4", "T3FREE", "THYROIDAB" in the last 72 hours. Anemia Panel: No results for input(s): "VITAMINB12", "FOLATE", "FERRITIN", "TIBC", "IRON", "RETICCTPCT" in the last 72 hours.  Sepsis Labs: No results for input(s): "PROCALCITON", "LATICACIDVEN" in the last 168 hours.  No results found for this or any previous visit (from the past 240 hour(s)).       Radiology Studies: CT HEAD WO CONTRAST ( )  Result Date: 03/10/2023 CLINICAL DATA:  62 year old female with worsening NIH stroke scale. Code stroke presentation on 02/25/2023 with small acute white matter infarcts on MRI at that time. EXAM: CT HEAD WITHOUT CONTRAST TECHNIQUE: Contiguous axial images were obtained from the base of the skull through the vertex without intravenous contrast. RADIATION DOSE REDUCTION: This exam was performed according to the departmental dose-optimization program which includes automated exposure control, adjustment of the mA and/or kV according to patient size and/or use of iterative reconstruction technique. COMPARISON:  Head CT 02/26/2023 and earlier. FINDINGS: Brain: Large chronic left PCA territory infarct with encephalomalacia and some ex vacuo ventricular enlargement. Cortical encephalomalacia in the right parietal lobe. Confluent deep white matter capsule hypodensity and bilateral deep gray nuclei heterogeneity. Stable gray-white matter differentiation throughout the brain. No acute intracranial hemorrhage identified. No midline shift, mass effect, or evidence of intracranial mass lesion. No acute ventricular enlargement. Vascular: Calcified atherosclerosis at the skull base. No suspicious intracranial vascular hyperdensity. Skull: Stable and intact. Sinuses/Orbits: Visualized  paranasal sinuses and mastoids are stable and well aerated. Other: Visualized orbits and scalp soft tissues are within normal limits. IMPRESSION: Stable non contrast CT appearance of the brain from earlier this month. Chronic ischemic disease. No acute intracranial abnormality. Electronically Signed   By: Odessa Fleming M.D.   On: 03/10/2023 11:57        Scheduled Meds:  amLODipine  10 mg Oral Daily   aspirin EC  81 mg Oral Daily   B-complex with vitamin C  1 tablet Oral Daily   clopidogrel  75 mg Oral Daily   enoxaparin (LOVENOX) injection  30 mg Subcutaneous Q24H   ezetimibe  10 mg Oral Daily   feeding supplement  237 mL Oral BID BM   insulin aspart  0-5 Units Subcutaneous QHS   insulin aspart  0-9 Units Subcutaneous TID WC   insulin aspart  2 Units Subcutaneous TID WC   insulin glargine-yfgn  8 Units Subcutaneous QHS   isosorbide mononitrate  10 mg Oral TID   levETIRAcetam  250 mg Oral BID   metoprolol tartrate  12.5 mg Oral BID   polyethylene glycol  17 g Oral Daily   rosuvastatin  20  mg Oral QHS   senna-docusate  2 tablet Oral QHS   Vitamin D (Ergocalciferol)  50,000 Units Oral Q7 days   Continuous Infusions:   LOS: 13 days    Time spent: 25 mins    Charise Killian, MD [facility] Pager [phone]  If 7PM-7AM, please contact night-coverage [email or web address] 03/11/2023, 7:42 AM

## 2023-03-12 DIAGNOSIS — I639 Cerebral infarction, unspecified: Secondary | ICD-10-CM | POA: Diagnosis not present

## 2023-03-12 LAB — CBC
HCT: 28.2 % — ABNORMAL LOW (ref 36.0–46.0)
Hemoglobin: 9.8 g/dL — ABNORMAL LOW (ref 12.0–15.0)
MCH: 29 pg (ref 26.0–34.0)
MCHC: 34.8 g/dL (ref 30.0–36.0)
MCV: 83.4 fL (ref 80.0–100.0)
Platelets: 273 10*3/uL (ref 150–400)
RBC: 3.38 MIL/uL — ABNORMAL LOW (ref 3.87–5.11)
RDW: 14.5 % (ref 11.5–15.5)
WBC: 5.9 10*3/uL (ref 4.0–10.5)
nRBC: 0 % (ref 0.0–0.2)

## 2023-03-12 LAB — GLUCOSE, CAPILLARY
Glucose-Capillary: 269 mg/dL — ABNORMAL HIGH (ref 70–99)
Glucose-Capillary: 235 mg/dL — ABNORMAL HIGH (ref 70–99)
Glucose-Capillary: 81 mg/dL (ref 70–99)

## 2023-03-12 LAB — BASIC METABOLIC PANEL
Anion gap: 9 (ref 5–15)
BUN: 56 mg/dL — ABNORMAL HIGH (ref 8–23)
CO2: 20 mmol/L — ABNORMAL LOW (ref 22–32)
Calcium: 8.5 mg/dL — ABNORMAL LOW (ref 8.9–10.3)
Chloride: 106 mmol/L (ref 98–111)
Creatinine, Ser: 1.86 mg/dL — ABNORMAL HIGH (ref 0.44–1.00)
GFR, Estimated: 30 mL/min — ABNORMAL LOW (ref >60)
Glucose, Bld: 291 mg/dL — ABNORMAL HIGH (ref 70–99)
Potassium: 5.3 mmol/L — ABNORMAL HIGH (ref 3.5–5.1)
Sodium: 135 mmol/L (ref 135–145)

## 2023-03-12 LAB — POTASSIUM: Potassium: 4.1 mmol/L (ref 3.5–5.1)

## 2023-03-12 MED ORDER — ASPIRIN 81 MG PO TBEC: 81.0000 mg | DELAYED_RELEASE_TABLET | Freq: Every day | ORAL | 0 refills | Status: AC

## 2023-03-12 MED ORDER — SODIUM ZIRCONIUM CYCLOSILICATE 10 G PO PACK
10.0000 g | PACK | Freq: Once | ORAL | Status: AC
  Administered 2023-03-12: 10 g via ORAL
  Filled 2023-03-12: qty 1

## 2023-03-12 MED ORDER — ISOSORBIDE MONONITRATE 10 MG PO TABS: 10.0000 mg | ORAL_TABLET | Freq: Three times a day (TID) | ORAL | 0 refills | Status: DC

## 2023-03-12 MED ORDER — EZETIMIBE 10 MG PO TABS: 10.0000 mg | ORAL_TABLET | Freq: Every day | ORAL | 0 refills | Status: DC

## 2023-03-12 NOTE — Inpatient Diabetes Management (Signed)
Inpatient Diabetes Program Recommendations  AACE/ADA: New Consensus Statement on Inpatient Glycemic Control (2015)  Target Ranges:  Prepandial:   less than 140 mg/dL      Peak postprandial:   less than 180 mg/dL (1-2 hours)      Critically ill patients:  140 - 180 mg/dL    Latest Reference Range & Units 03/11/23 07:48 03/11/23 11:04 03/11/23 16:37 03/11/23 20:09  Glucose-Capillary 70 - 99 mg/dL 706 (H)  4 units Novolog  273 (H)  7 units Novolog  280 (H)  7 units Novolog  220 (H)  2 units Novolog  8 units Semglee  (H): Data is abnormally high  Latest Reference Range & Units 03/12/23 07:57  Glucose-Capillary 70 - 99 mg/dL 237 (H)  7 units Novolog   (H): Data is abnormally high   Home DM Meds: Tresiba 10 units QPM                              Novolog 5 units TID with meals   Current Orders: Semglee 8 units QPM  Novolog 0-9 units TID ac/hs  Novolog 2 units TID with meals        MD- Note CBG elevations yest and again this AM   Please consider:   1. Increase Semglee to 12 units at bedtime   2. Increase Novolog Meal Coverage to 4 units TID with meals    --Will follow patient during hospitalization--  Ambrose Finland RN, MSN, CDCES Diabetes Coordinator Inpatient Glycemic Control Team Team Pager: [phone] (8a-5p)

## 2023-03-12 NOTE — Evaluation (Signed)
Occupational Therapy Re-Evaluation Patient Details Name: [name] Gilbert MRN: [id number] DOB: Oct 20, 1960 Today's Date: 03/12/2023   History of Present Illness Pt is a 62 y/o F admitted on 02/25/23 after presenting with acut onset of expressive aphasia & R gaze preference with L sided weakness. Pt is being treated for acute CVA. PMH: DM2, CKD 3B, seizure disorder, HTN, CVA   Clinical Impression   Pt was seen for OT re-evaluation this date with PT for co-tx to optimize safety and ADL mobility.  Pt currently requires MOD A +2 for bed mobility, STS, and SPT from bed to recliner with heavy VC for sequencing, RW mgt, and hand placement. Pt required TOTAL A for pericare in standing while maintaining BUE support on RW with external assist to maintain static standing. Pt would benefit from continued skilled OT services to address noted impairments and functional limitations (see below for any additional details) in order to maximize safety and independence while minimizing falls risk and caregiver burden. OT goals updated to reflect progress to date.    Recommendations for follow up therapy are one component of a multi-disciplinary discharge planning process, led by the attending physician.  Recommendations may be updated based on patient status, additional functional criteria and insurance authorization.   Assistance Recommended at Discharge Frequent or constant Supervision/Assistance  Patient can return home with the following Two people to help with walking and/or transfers;Two people to help with bathing/dressing/bathroom;Assistance with cooking/housework;Assistance with feeding;Direct supervision/assist for medications management;Direct supervision/assist for financial management;Assist for transportation;Help with stairs or ramp for entrance    Functional Status Assessment  Patient has had a recent decline in their functional status and demonstrates the ability to make significant improvements in  function in a reasonable and predictable amount of time.  Equipment Recommendations  Other (comment) (defer to next venue)    Recommendations for Other Services       Precautions / Restrictions Precautions Precautions: Fall Precaution Comments: hx R hemi, new L weakness Restrictions Weight Bearing Restrictions: No      Mobility Bed Mobility Overal bed mobility: Needs Assistance Bed Mobility: Supine to Sit     Supine to sit: Mod assist, HOB elevated, +2 for physical assistance     General bed mobility comments: MOD A +2 and VC for bed rail use, sequencing    Transfers Overall transfer level: Needs assistance Equipment used: Rolling walker (2 wheels) Transfers: Sit to/from Stand, Bed to chair/wheelchair/BSC Sit to Stand: Mod assist, From elevated surface, +2 physical assistance Stand pivot transfers: Mod assist, +2 physical assistance         General transfer comment: heavy VC for sequencing      Balance Overall balance assessment: Needs assistance Sitting-balance support: Feet supported, Feet unsupported, Bilateral upper extremity supported Sitting balance-Leahy Scale: Fair Sitting balance - Comments: initial poor improving to fair Postural control: Left lateral lean Standing balance support: During functional activity, Bilateral upper extremity supported, Reliant on assistive device for balance Standing balance-Leahy Scale: Poor                             ADL either performed or assessed with clinical judgement   ADL Overall ADL's : Needs assistance/impaired     Grooming: Sitting;Set up               Lower Body Dressing: Sitting/lateral leans;Maximal assistance   Toilet Transfer: Rolling walker (2 wheels);BSC/3in1;Moderate assistance;Stand-pivot;+2 for physical assistance;Cueing for sequencing   Toileting- Clothing Manipulation and Hygiene:  Sit to/from stand;Maximal assistance;+2 for physical assistance               Vision          Perception     Praxis      Pertinent Vitals/Pain Pain Assessment Pain Assessment: Faces Faces Pain Scale: Hurts a little bit Pain Location: doesn't say Pain Descriptors / Indicators: Grimacing Pain Intervention(s): Limited activity within patient's tolerance, Monitored during session, Repositioned     Hand Dominance  (unsure. RUE has high tone at baseline from prior CVA per notes.)   Extremity/Trunk Assessment Upper Extremity Assessment Upper Extremity Assessment: Generalized weakness RUE Deficits / Details: requires assist to place RUE on RW LUE Deficits / Details: L shoulder flexion limited to approx 30 degrees upon testing today. L hand with flexion of fingers 2-5; when pt holding silverware for eating, pt using lateral pinch grasp only. However, upon command pt able to extend fingers and grasp styrofoam cup to drink through straw, sometimes using just LUE and sometimes using BIL hands. Poor supination in LUE.   Lower Extremity Assessment Lower Extremity Assessment: Defer to PT evaluation       Communication Communication Communication: Expressive difficulties   Cognition Arousal/Alertness: Awake/alert Behavior During Therapy: Flat affect Overall Cognitive Status: No family/caregiver present to determine baseline cognitive functioning                                 General Comments: increased processing time, cues for sequencing     General Comments       Exercises     Shoulder Instructions      Home Living Family/patient expects to be discharged to:: Skilled nursing facility Living Arrangements: Spouse/significant other Available Help at Discharge: Family;Available PRN/intermittently Type of Home: House                           Additional Comments: Per chart & pt, she arrived from home. Lives with husband. Is unable to provide any further information.      Prior Functioning/Environment Prior Level of Function : Needs assist              Mobility Comments: Pt is a poor historian, reports she was able to transfer bed<>w/c but unable to state how she does it. Does note she does not walk. ADLs Comments: Pt unable to state level of assist with ADLs at baseline beyond that her husband helps her with her socks.        OT Problem List: Decreased strength;Decreased range of motion;Decreased activity tolerance;Impaired balance (sitting and/or standing);Decreased safety awareness;Decreased cognition;Impaired UE functional use      OT Treatment/Interventions: Self-care/ADL training;Therapeutic exercise;Therapeutic activities;Patient/family education    OT Goals(Current goals can be found in the care plan section) Acute Rehab OT Goals Patient Stated Goal: unable to state OT Goal Formulation: Patient unable to participate in goal setting Time For Goal Achievement: 03/26/23 Potential to Achieve Goals: Fair ADL Goals Pt Will Perform Grooming: with min assist;sitting;with set-up  OT Frequency: Min 2X/week    Co-evaluation PT/OT/SLP Co-Evaluation/Treatment: Yes Reason for Co-Treatment: For patient/therapist safety;Necessary to address cognition/behavior during functional activity;Complexity of the patient's impairments (multi-system involvement) PT goals addressed during session: Balance;Mobility/safety with mobility OT goals addressed during session: ADL's and self-care;Strengthening/ROM      AM-PAC OT "6 Clicks" Daily Activity     Outcome Measure Help from another person eating meals?: A  Lot Help from another person taking care of personal grooming?: A Little Help from another person toileting, which includes using toliet, bedpan, or urinal?: Total Help from another person bathing (including washing, rinsing, drying)?: A Lot Help from another person to put on and taking off regular upper body clothing?: A Lot Help from another person to put on and taking off regular lower body clothing?: Total 6 Click Score: 11    End of Session Equipment Utilized During Treatment: Rolling walker (2 wheels);Gait belt Nurse Communication: Mobility status  Activity Tolerance: Patient tolerated treatment well Patient left: in chair;with call bell/phone within reach;with chair alarm set;with nursing/sitter in room  OT Visit Diagnosis: Muscle weakness (generalized) (M62.81);Hemiplegia and hemiparesis Hemiplegia - Right/Left: Right                Time: 1610-9604 OT Time Calculation (min): 24 min Charges:  OT General Charges $OT Visit: 1 Visit OT Evaluation $OT Re-eval: 1 Re-eval OT Treatments $Self Care/Home Management : 8-22 mins  Arman Filter., MPH, MS, OTR/L ascom [phone] 03/12/23, 11:49 AM

## 2023-03-12 NOTE — Progress Notes (Signed)
Patient being discharged via EMS to [facility]. All belongings sent with patient and IV removed. Report called to [place] at [facility].

## 2023-03-12 NOTE — Care Management Important Message (Signed)
Important Message  Patient Details  Name: Teah Arment MRN: [id number] Date of Birth: 01-26-1961   Medicare Important Message Given:  Yes     [name] A Freedom Lopezperez 03/12/2023, 11:27 AM

## 2023-03-12 NOTE — Evaluation (Signed)
Physical Therapy Re-Evaluation Patient Details Name: [name] Gilbert MRN: [id number] DOB: Aug 18, 1961 Today's Date: 03/12/2023  History of Present Illness  Pt is a 62 y/o F admitted on 02/25/23 after presenting with acut onset of expressive aphasia & R gaze preference with L sided weakness. Pt is being treated for acute CVA. PMH: DM2, CKD 3B, seizure disorder, HTN, CVA  Clinical Impression  PT re-evaluation performed; pt seen with OT for re-evaluation.  Currently pt is mod assist x2 to stand from recliner and to weight-shift L/R in standing with B UE support on RW (pt unable to clear floor when cued to take steps in standing).  Pt would currently benefit from skilled PT to address noted impairments and functional limitations (see below for any additional details).  Upon hospital discharge, pt would benefit from ongoing therapy.  PT POC reviewed and updated.     Assistance Recommended at Discharge Frequent or constant Supervision/Assistance  If plan is discharge home, recommend the following:  Can travel by private vehicle  Two people to help with bathing/dressing/bathroom;Two people to help with walking and/or transfers;Help with stairs or ramp for entrance;Assist for transportation;Direct supervision/assist for financial management   No    Equipment Recommendations Other (comment) (TBD at next facility)  Recommendations for Other Services       Functional Status Assessment Patient has had a recent decline in their functional status and demonstrates the ability to make significant improvements in function in a reasonable and predictable amount of time.     Precautions / Restrictions Precautions Precautions: Fall Precaution Comments: hx R hemi, new L weakness Restrictions Weight Bearing Restrictions: No      Mobility  Bed Mobility               General bed mobility comments: Deferred (pt sitting in recliner with OT and nurse present upon PT arrival)    Transfers Overall  transfer level: Needs assistance Equipment used: Rolling walker (2 wheels) Transfers: Sit to/from Stand Sit to Stand: Mod assist, From elevated surface, +2 physical assistance           General transfer comment: vc's for UE/LE placement; assist to initiate stand and come to full upright standing; assist to control descent sitting    Ambulation/Gait Ambulation/Gait assistance: Mod assist, +2 physical assistance   Assistive device: Rolling walker (2 wheels)         General Gait Details: pt able to weightshift L/R 5x's each but unable to pick foot up off floor  Stairs            Wheelchair Mobility     Tilt Bed    Modified Rankin (Stroke Patients Only)       Balance Overall balance assessment: Needs assistance Sitting-balance support: Bilateral upper extremity supported, Feet supported Sitting balance-Leahy Scale: Fair Sitting balance - Comments: steady static sitting in recliner Postural control: Left lateral lean Standing balance support: During functional activity, Bilateral upper extremity supported, Reliant on assistive device for balance Standing balance-Leahy Scale: Poor Standing balance comment: 2 assist for safety/balance in standing                             Pertinent Vitals/Pain Pain Assessment Pain Assessment: Faces Pain Location: doesn't say Pain Descriptors / Indicators: Grimacing Pain Intervention(s): Limited activity within patient's tolerance, Monitored during session, Repositioned Vitals (HR and SpO2 on room air) stable and WFL throughout treatment session.    Home Living Family/patient expects to be discharged  to:: Skilled nursing facility Living Arrangements: Spouse/significant other Available Help at Discharge: Family;Available PRN/intermittently Type of Home: House             Additional Comments: Per chart & pt, she arrived from home. Lives with husband. Is unable to provide any further information.    Prior  Function Prior Level of Function : Needs assist             Mobility Comments: Pt appears to be a poor historian; reports she was able to transfer bed<>w/c but unable to state how she does it; does note she does not walk. ADLs Comments: Pt unable to state level of assist with ADLs at baseline beyond that her husband helps her with her socks.     Hand Dominance   Dominant Hand:  (unsure. RUE has high tone at baseline from prior CVA per notes.)    Extremity/Trunk Assessment   Upper Extremity Assessment Upper Extremity Assessment: Defer to OT evaluation;Generalized weakness RUE Deficits / Details: Requires assist to place RUE on RW LUE Deficits / Details: Per OT "L shoulder flexion limited to approx 30 degrees upon testing today. L hand with flexion of fingers 2-5; when pt holding silverware for eating, pt using lateral pinch grasp only. However, upon command pt able to extend fingers and grasp styrofoam cup to drink through straw, sometimes using just LUE and sometimes using BIL hands. Poor supination in LUE."    Lower Extremity Assessment Lower Extremity Assessment: Defer to PT evaluation RLE Deficits / Details: hip flexion 3-/5; knee flexion/extension 3+/5; DF 2+/5 LLE Deficits / Details: hip flexion 3-/5; knee flexion/extension 4-/5; DF 3+/5       Communication   Communication: Expressive difficulties  Cognition Arousal/Alertness: Awake/alert Behavior During Therapy: Flat affect Overall Cognitive Status: No family/caregiver present to determine baseline cognitive functioning                                 General Comments: Increased processing time; cues for sequencing        General Comments  Nursing cleared pt for participation in physical therapy.  Pt agreeable to PT session.    Exercises     Assessment/Plan    PT Assessment Patient needs continued PT services  PT Problem List Decreased strength;Decreased coordination;Decreased range of  motion;Decreased activity tolerance;Decreased balance;Decreased mobility;Decreased safety awareness;Decreased knowledge of use of DME;Decreased cognition;Pain       PT Treatment Interventions DME instruction;Therapeutic exercise;Gait training;Balance training;Neuromuscular re-education;Functional mobility training;Cognitive remediation;Therapeutic activities;Patient/family education;Modalities    PT Goals (Current goals can be found in the Care Plan section)  Acute Rehab PT Goals Patient Stated Goal: to improve mobility PT Goal Formulation: Patient unable to participate in goal setting Time For Goal Achievement: 03/26/23 Potential to Achieve Goals: Fair    Frequency Min 3X/week     Co-evaluation PT/OT/SLP Co-Evaluation/Treatment: Yes Reason for Co-Treatment: For patient/therapist safety;Necessary to address cognition/behavior during functional activity;Complexity of the patient's impairments (multi-system involvement) PT goals addressed during session: Balance;Mobility/safety with mobility OT goals addressed during session: ADL's and self-care;Strengthening/ROM       AM-PAC PT "6 Clicks" Mobility  Outcome Measure Help needed turning from your back to your side while in a flat bed without using bedrails?: Total Help needed moving from lying on your back to sitting on the side of a flat bed without using bedrails?: Total Help needed moving to and from a bed to a chair (including a wheelchair)?:  Total Help needed standing up from a chair using your arms (e.g., wheelchair or bedside chair)?: Total Help needed to walk in hospital room?: Total Help needed climbing 3-5 steps with a railing? : Total 6 Click Score: 6    End of Session Equipment Utilized During Treatment: Gait belt Activity Tolerance: Patient limited by fatigue Patient left: in chair;with call bell/phone within reach;with chair alarm set Nurse Communication: Mobility status;Precautions PT Visit Diagnosis: Muscle  weakness (generalized) (M62.81);Hemiplegia and hemiparesis;Difficulty in walking, not elsewhere classified (R26.2);Other abnormalities of gait and mobility (R26.89);Unsteadiness on feet (R26.81) Hemiplegia - Right/Left: Right Hemiplegia - caused by: Cerebral infarction    Time: 1021-1039 PT Time Calculation (min) (ACUTE ONLY): 18 min   Charges:   PT Evaluation $PT Re-evaluation: 1 Re-eval   PT General Charges $$ ACUTE PT VISIT: 1 Visit        Hendricks Limes, PT 03/12/23, 12:49 PM

## 2023-03-12 NOTE — Discharge Summary (Addendum)
Physician Discharge Summary  [name] Gilbert ZOX:[id number] DOB: 1961/06/22 DOA: 02/25/2023  PCP: Tobey Grim, MD  Admit date: 02/25/2023 Discharge date: 03/12/2023  Admitted From: home  Disposition:  SNF  Recommendations for Outpatient Follow-up:  Follow up with PCP in 1-2 weeks F/u w/ neuro, Dr. Malvin Johns, in 8-12 weeks   Home Health: no  Equipment/Devices:  Discharge Condition: stable  CODE STATUS: DNR  Diet recommendation: Heart Healthy / Carb Modified  Brief/Interim Summary: HPI was taken from Dr. Arville Care: [name] Gilbert is a 62 y.o. African-American female with medical history significant for type 2 diabetes mellitus, with stage IIIb chronic kidney disease, seizure disorder, hypertension and CVA, who presented to emergency room with acute onset of expressive aphasia with right gaze preference and left-sided weakness that started around 11:20 PM.  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  Her husband believes that the left side was weak before as well.  No headache or dizziness or blurred vision.  No chest pain or palpitations.  No nausea or vomiting or abdominal pain.  No fever or chills.  No new paresthesias.  She has a right facial droop.  No dysuria, oliguria or hematuria or flank pain.  No weakness seizures.  No tinnitus or vertigo.   The patient was seen yesterday for abdominal pain that was managed in the ER.  She was found to be constipated and was started to have lower urinary tract infection.  She was given p.o. Bactrim DS.   ED Course: When she came to the ER, BP was 146/112 with otherwise normal vital signs.  Later on BP was 135/74.  Labs revealed borderline potassium of 3.5, CO2 19, blood glucose 249, a creatinine 1.63 better than yesterday and calcium 8.7 and CBC was within normal.  Coag profile was within normal.  Echo levels less than 10.  Urinalysis today shows digit 0-5  WBCs like yesterday and rare bacteria compared to many yesterday.   EKG as reviewed by me : EKG showed sinus rhythm with a rate of 88 with right axis deviation and prolonged QT interval with QTc 518 MS. Imaging: Portable chest x-ray showed central line placement without evidence of pneumothorax.  Noncontrasted head CT scan revealed remote left PCA territory infarction with no acute intracranial abnormalities.  CTA of the head and neck with and without contrast revealed the following: 1. No new intracranial large vessel occlusion. Redemonstrated short segment occlusion of the left A2, with additional areas of focal moderate stenosis more distally in the ACAs. 2. Redemonstrated occlusion of the left PCA with multifocal moderate to severe stenosis in the right PCA. 3. Multifocal severe stenosis throughout the bilateral MCA branches. 4. No hemodynamically significant stenosis in the neck. 5. Perfusion imaging demonstrates an infarct core in the left parietal lobe, with the number in the left parietal and left frontal lobe. The left parietal findings likely correlate with the chronic encephalomalacia in the left PCA territory. In the inferior left frontal lobe penumbra may be artifactual, secondary to motion.   The patient was given an amp of D50, 4 mg of IV Zofran and 1 L bolus of IV normal saline.  She will be admitted to an observation medical telemetry bed for further evaluation and management.   As per Dr. Renae Gloss: 6/17: Notified by bedside RN that NIH score had increased.  Evaluated patient at bedside.  She seems to be flattened in affect.  Case discussed with  neurology.  They feel patient is at neurologic baseline.  CT head did not show any acute intracranial abnormality.  Echocardiogram showed a normal EF and negative bubble study though the window was suboptimal.   6/18: Repeat head CT without new issues.  Echocardiogram reassuring.  Bubble study negative. 6/19.  Will discontinue IV fluids  and discontinue central line.  Encouraged to eat. 6/20.  Patient's husband states that she will not eat anything that is baked and he will have to bring in food.  I am okay with that. 6/22.  Patient's sugar was a little low this morning.  Will decrease long-acting insulin at night.  Sugar before lunch elevated. 6/23.  Patient worked with occupational therapy and then told me she did not feel well after that.  She drank an Ensure this morning. 6/24.  Awaiting insurance authorization for rehab. 6/25.  Still awaiting insurance authorization for rehab.  As per Dr. Mayford Knife 6/26-03/12/23: A fast appeal was done for this pt to get to SNF. Pt was d/c to SNF after insurance auth was confirmed. For more information, please see previous progress/consult notes   Discharge Diagnoses:  Principal Problem:   Acute CVA (cerebrovascular accident) [facility]) Active Problems:   Essential hypertension   Type II diabetes mellitus with renal manifestations (HCC)   CKD (chronic kidney disease), stage IV (HCC)   Dyslipidemia   Seizure disorder (HCC)   Iron deficiency anemia   Hypoglycemia   Expressive aphasia   Liver cirrhosis (HCC)  Acute CVA: continue on aspirin, statin, plavix as per neuro. Repeat CT head shows no acute intracranial abnormalities     HTN: continue on imdur, amlodipine, metoprolol   DM2: poorly controlled, HbA1c 8.7. Restart home anti-meds   CKDIV: Cr is labile. Avoid nephrotoxic meds   Hyperkalemia: lokelma x 1. Cr is trending down from day prior    HLD: continue on statin    Seizure disorder: continue on keppra    Normocytic anemia: H&H are labile. No need for a transfusion currently     Liver cirrhosis: seen on prior imaging. 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 Management per PCP outpatient   Discharge Instructions  Discharge Instructions     Diet - low sodium heart healthy   Complete by: As directed    Diet Carb Modified    Complete by: As directed    Discharge instructions   Complete by: As directed    F/u w/ PCP in 1-2 weeks. F/u w/ neuro, Dr. Malvin Johns, in 8-12 weeks   Increase activity slowly   Complete by: As directed       Allergies as of 03/12/2023       Reactions   Metformin Other (See Comments)   Reaction: unknown   Penicillins Hives, Nausea And Vomiting, Swelling   Has patient had a PCN reaction causing immediate rash, facial/tongue/throat swelling, SOB or lightheadedness with hypotension: Yes Has patient had a PCN reaction causing severe rash involving mucus membranes or skin necrosis: No Has patient had a PCN reaction that required hospitalization Yes Has patient had a PCN reaction occurring within the last 10 years: No If all of the above answers are "NO", then may proceed with Cephalosporin use.   Codeine Other (See Comments)   Reaction: HIVES   Hydralazine    Stomach pain   Statins Other (See Comments)   Myopathy with atorva 80        Medication List     STOP taking these medications  sulfamethoxazole-trimethoprim 800-160 MG tablet Commonly known as: BACTRIM DS       TAKE these medications    acetaminophen 325 MG tablet Commonly known as: TYLENOL Take 2 tablets (650 mg total) by mouth every 4 (four) hours as needed for mild pain (or temp > 37.5 C (99.5 F)).   amLODipine 10 MG tablet Commonly known as: NORVASC Take 10 mg by mouth daily.   aspirin EC 81 MG tablet Take 1 tablet (81 mg total) by mouth daily. Swallow whole. Start taking on: March 13, 2023   B-complex with vitamin C tablet Take 1 tablet by mouth daily.   clopidogrel 75 MG tablet Commonly known as: PLAVIX Take 1 tablet (75 mg total) by mouth daily.   ezetimibe 10 MG tablet Commonly known as: ZETIA Take 1 tablet (10 mg total) by mouth daily. Start taking on: March 13, 2023   Insulin Aspart FlexPen 100 UNIT/ML Commonly known as: NOVOLOG Inject 5 Units into the skin with breakfast, with lunch, and with  evening meal.   isosorbide mononitrate 10 MG tablet Commonly known as: ISMO Take 1 tablet (10 mg total) by mouth 3 (three) times daily.   levETIRAcetam 250 MG tablet Commonly known as: KEPPRA Take 250 mg by mouth 2 (two) times daily.   metoprolol succinate 25 MG 24 hr tablet Commonly known as: TOPROL-XL Take 1 tablet (25 mg total) by mouth daily.   polyethylene glycol 17 g packet Commonly known as: MIRALAX / GLYCOLAX Take 17 g by mouth daily.   rosuvastatin 40 MG tablet Commonly known as: CRESTOR Take 0.5 tablets (20 mg total) by mouth at bedtime.   senna-docusate 8.6-50 MG tablet Commonly known as: Senokot-S Take 2 tablets by mouth at bedtime.   Evaristo Bury FlexTouch 100 UNIT/ML FlexTouch Pen Generic drug: insulin degludec Inject 10 Units into the skin at bedtime.   Vitamin D (Ergocalciferol) 1.25 MG (50000 UNIT) Caps capsule Commonly known as: DRISDOL Take 50,000 Units by mouth every 7 (seven) days.        Allergies  Allergen Reactions   Metformin Other (See Comments)    Reaction: unknown   Penicillins Hives, Nausea And Vomiting and Swelling    Has patient had a PCN reaction causing immediate rash, facial/tongue/throat swelling, SOB or lightheadedness with hypotension: Yes Has patient had a PCN reaction causing severe rash involving mucus membranes or skin necrosis: No Has patient had a PCN reaction that required hospitalization Yes Has patient had a PCN reaction occurring within the last 10 years: No If all of the above answers are "NO", then may proceed with Cephalosporin use.    Codeine Other (See Comments)    Reaction: HIVES   Hydralazine     Stomach pain   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    Consultations: Neuro    Procedures/Studies: CT HEAD WO CONTRAST ( )  Result Date: 03/10/2023 CLINICAL DATA:  62 year old female with worsening NIH stroke scale. Code stroke presentation on 02/25/2023 with small acute white matter infarcts  on MRI at that time. EXAM: CT HEAD WITHOUT CONTRAST TECHNIQUE: Contiguous axial images were obtained from the base of the skull through the vertex without intravenous contrast. RADIATION DOSE REDUCTION: This exam was performed according to the departmental dose-optimization program which includes automated exposure control, adjustment of the mA and/or kV according to patient size and/or use of iterative reconstruction technique. COMPARISON:  Head CT 02/26/2023 and earlier. FINDINGS: Brain: Large chronic left PCA territory infarct with encephalomalacia and some ex  vacuo ventricular enlargement. Cortical encephalomalacia in the right parietal lobe. Confluent deep white matter capsule hypodensity and bilateral deep gray nuclei heterogeneity. Stable gray-white matter differentiation throughout the brain. No acute intracranial hemorrhage identified. No midline shift, mass effect, or evidence of intracranial mass lesion. No acute ventricular enlargement. Vascular: Calcified atherosclerosis at the skull base. No suspicious intracranial vascular hyperdensity. Skull: Stable and intact. Sinuses/Orbits: Visualized paranasal sinuses and mastoids are stable and well aerated. Other: Visualized orbits and scalp soft tissues are within normal limits. IMPRESSION: Stable non contrast CT appearance of the brain from earlier this month. Chronic ischemic disease. No acute intracranial abnormality. Electronically Signed   By: Odessa Fleming M.D.   On: 03/10/2023 11:57   DG Chest Port 1 View  Result Date: 02/26/2023 CLINICAL DATA:  Chest pain EXAM: PORTABLE CHEST 1 VIEW COMPARISON:  X-ray 02/24/2023 FINDINGS: Underinflation. No consolidation, pneumothorax or effusion. Normal cardiopericardial silhouette without edema when adjusting for level of inflation. Stable right IJ catheter in place with tip along the lower right atrium. This could be retracted 4-5 cm. IMPRESSION: Underinflation.  No acute cardiopulmonary disease. Once again right IJ  line in place with tip along the lower right atrium. This could be retracted 4-5 cm Electronically Signed   By: Karen Kays M.D.   On: 02/26/2023 19:57   EEG adult  Result Date: 02/26/2023 Jefferson Fuel, MD     02/26/2023  3:47 PM Routine EEG Report [name] Gilbert is a 62 y.o. female with a history of altered mental status who is undergoing an EEG to evaluate for seizures. Report: This EEG was acquired with electrodes placed according to the International 10-20 electrode system (including Fp1, Fp2, F3, F4, C3, C4, P3, P4, O1, O2, T3, T4, T5, T6, A1, A2, Fz, Cz, Pz). The following electrodes were missing or displaced: none. The occipital dominant rhythm was 7 Hz. This activity is reactive to stimulation. Drowsiness was manifested by background fragmentation; deeper stages of sleep were identified by K complexes and sleep spindles. There was no focal slowing. There were no interictal epileptiform discharges. There were no electrographic seizures identified. There was no abnormal response to photic stimulation or hyperventilation. Impression and clinical correlation: This EEG was obtained while awake and asleep and is abnormal due to mild diffuse slowing indicative of global cerebral dysfunction. Epileptiform abnormalities were not seen during this recording. Bing Neighbors, MD [facility] [phone] If 7pm- 7am, please page neurology on call as listed in AMION.   ECHOCARDIOGRAM COMPLETE BUBBLE STUDY  Result Date: 02/26/2023    ECHOCARDIOGRAM REPORT   Patient Name:   NISHI BASSETTE Date of Exam: 02/26/2023 Medical Rec #:  [id number]        Height:       62.0 in Accession #:    [id number]       Weight:       140.0 lb Date of Birth:  1961-07-24        BSA:          1.643 m Patient Age:    61 years         BP:           122/59 mmHg Patient Gender: F                HR:           72 bpm. Exam Location:  [facility] Procedure: 2D Echo, Cardiac Doppler, Color Doppler and Saline Contrast Bubble  Study Indications:     Stroke 434.91 / I63.9  History:         Patient has prior history of Echocardiogram examinations, most                  recent 10/15/2022. Stroke; Risk Factors:Hypertension and                  Diabetes.  Sonographer:     Cristela Blue Referring Phys:  [id number] JAN A MANSY Diagnosing Phys: Yvonne Kendall MD  Sonographer Comments: Suboptimal apical window. IMPRESSIONS  1. Left ventricular ejection fraction, by estimation, is 60 to 65%. The left ventricle has normal function. The left ventricle has no regional wall motion abnormalities. There is mild left ventricular hypertrophy. Left ventricular diastolic parameters are consistent with Grade I diastolic dysfunction (impaired relaxation).  2. Right ventricular systolic function is normal. The right ventricular size is normal. Tricuspid regurgitation signal is inadequate for assessing PA pressure.  3. The mitral valve is normal in structure. Trivial mitral valve regurgitation. No evidence of mitral stenosis.  4. The aortic valve is tricuspid. There is mild calcification of the aortic valve. There is mild thickening of the aortic valve. Aortic valve regurgitation is not visualized. No aortic stenosis is present.  5. Bubble study is probably negative for intracardiac shunt though evaluation is limited by suboptimal acoustic windows. FINDINGS  Left Ventricle: Left ventricular ejection fraction, by estimation, is 60 to 65%. The left ventricle has normal function. The left ventricle has no regional wall motion abnormalities. The left ventricular internal cavity size was normal in size. There is  mild left ventricular hypertrophy. Left ventricular diastolic parameters are consistent with Grade I diastolic dysfunction (impaired relaxation). Right Ventricle: The right ventricular size is normal. No increase in right ventricular wall thickness. Right ventricular systolic function is normal. Tricuspid regurgitation signal is inadequate for assessing PA  pressure. Left Atrium: Left atrial size was normal in size. Right Atrium: Right atrial size was normal in size. Pericardium: The pericardium was not well visualized. Mitral Valve: The mitral valve is normal in structure. Trivial mitral valve regurgitation. No evidence of mitral valve stenosis. MV peak gradient, 3.9 mmHg. The mean mitral valve gradient is 2.0 mmHg. Tricuspid Valve: The tricuspid valve is not well visualized. Tricuspid valve regurgitation is trivial. Aortic Valve: The aortic valve is tricuspid. There is mild calcification of the aortic valve. There is mild thickening of the aortic valve. Aortic valve regurgitation is not visualized. No aortic stenosis is present. Aortic valve mean gradient measures 3.0 mmHg. Aortic valve peak gradient measures 4.8 mmHg. Aortic valve area, by VTI measures 2.87 cm. Pulmonic Valve: The pulmonic valve was not well visualized. Pulmonic valve regurgitation is not visualized. No evidence of pulmonic stenosis. Aorta: The aortic root is normal in size and structure. Pulmonary Artery: The pulmonary artery is of normal size. Venous: The inferior vena cava was not well visualized. IAS/Shunts: The interatrial septum was not well visualized. Agitated saline contrast was given intravenously to evaluate for intracardiac shunting. Bubble study is probably negative for intracardiac shunt though evaluation is limited by suboptimal acoustic windows.  LEFT VENTRICLE PLAX 2D LVIDd:         3.20 cm   Diastology LVIDs:         2.00 cm   LV e' medial:    7.07 cm/s LV PW:         1.00 cm   LV E/e' medial:  10.6 LV IVS:  1.10 cm   LV e' lateral:   8.92 cm/s LVOT diam:     2.00 cm   LV E/e' lateral: 8.4 LV SV:         69 LV SV Index:   42 LVOT Area:     3.14 cm  RIGHT VENTRICLE RV Basal diam:  3.30 cm RV Mid diam:    2.70 cm RV S prime:     11.60 cm/s TAPSE (M-mode): 2.4 cm LEFT ATRIUM             Index        RIGHT ATRIUM           Index LA diam:        3.20 cm 1.95 cm/m   RA Area:      11.60 cm LA Vol (A2C):   29.9 ml 18.20 ml/m  RA Volume:   29.40 ml  17.90 ml/m LA Vol (A4C):   22.2 ml 13.51 ml/m LA Biplane Vol: 27.6 ml 16.80 ml/m  AORTIC VALVE AV Area (Vmax):    2.23 cm AV Area (Vmean):   2.40 cm AV Area (VTI):     2.87 cm AV Vmax:           110.00 cm/s AV Vmean:          77.100 cm/s AV VTI:            0.240 m AV Peak Grad:      4.8 mmHg AV Mean Grad:      3.0 mmHg LVOT Vmax:         78.20 cm/s LVOT Vmean:        59.000 cm/s LVOT VTI:          0.219 m LVOT/AV VTI ratio: 0.91  AORTA Ao Root diam: 2.40 cm MITRAL VALVE MV Area (PHT): 3.74 cm    SHUNTS MV Area VTI:   2.75 cm    Systemic VTI:  0.22 m MV Peak grad:  3.9 mmHg    Systemic Diam: 2.00 cm MV Mean grad:  2.0 mmHg MV Vmax:       0.98 m/s MV Vmean:      67.7 cm/s MV Decel Time: 203 msec MV E velocity: 74.90 cm/s MV A velocity: 96.90 cm/s MV E/A ratio:  0.77 Cristal Deer End MD Electronically signed by Yvonne Kendall MD Signature Date/Time: 02/26/2023/1:14:50 PM    Final    CT HEAD WO CONTRAST ( )  Result Date: 02/26/2023 CLINICAL DATA:  62 year old female with code stroke presentation. Advanced chronic ischemic disease, brain MRI yesterday demonstrating small acute white matter infarcts in both hemispheres. EXAM: CT HEAD WITHOUT CONTRAST TECHNIQUE: Contiguous axial images were obtained from the base of the skull through the vertex without intravenous contrast. RADIATION DOSE REDUCTION: This exam was performed according to the departmental dose-optimization program which includes automated exposure control, adjustment of the mA and/or kV according to patient size and/or use of iterative reconstruction technique. COMPARISON:  Brain MRI, head CT, CTA head and neck 02/25/2023 FINDINGS: Brain: Small white matter infarcts positive on diffusion yesterday occurred in a background of extensive left hemisphere, Patchy and confluent right hemisphere hypodensity and are essentially occult by CT. No acute intracranial hemorrhage identified.  No midline shift, mass effect, or evidence of intracranial mass lesion. Superimposed left greater than right PCA, deep gray nuclei encephalomalacia with ex vacuo ventricular enlargement. No new intracranial abnormality identified. Vascular: Calcified atherosclerosis at the skull base. No suspicious intracranial vascular hyperdensity. Skull: No acute osseous abnormality  identified. Sinuses/Orbits: Visualized paranasal sinuses and mastoids are stable and well aerated. Other: No acute orbit or scalp soft tissue finding. IMPRESSION: 1. Advanced chronic ischemic disease. Recent small white matter infarcts on MRI yesterday are occult by CT. No associated hemorrhage or mass effect. 2. No new intracranial abnormality. Electronically Signed   By: Odessa Fleming M.D.   On: 02/26/2023 11:39   MR BRAIN WO CONTRAST  Result Date: 02/25/2023 CLINICAL DATA:  62 year old female with code stroke presentation, rightward gaze. Chronic ACA and PCA atherosclerosis and stenosis on CTA. EXAM: MRI HEAD WITHOUT CONTRAST TECHNIQUE: Multiplanar, multiecho pulse sequences of the brain and surrounding structures were obtained without intravenous contrast. COMPARISON:  CTA head and neck 0230 hours today. Brain MRI 01/01/2023. FINDINGS: Brain: Ongoing right posterosuperior frontal lobe subcortical white matter abnormal diffusion, slightly extended from similar abnormality on 01/01/2023 MRI (series 9, image 33). And contralateral posterior left frontal lobe subcortical white matter infarct with restricted diffusion near the same level is new. No other convincing acutely abnormal diffusion. Chronic infarcts and encephalomalacia in the bilateral PCA territories, right thalamus and left deep gray nuclei, posterior right MCA territory at the parietal lobe, to a lesser extent brainstem. Chronic microhemorrhage in the central pons. Additional brainstem Wallerian degeneration. Hemosiderin and/or laminar necrosis throughout much of the left PCA territory.  Confluent additional cerebral white matter T2 and FLAIR hyperintensity. No midline shift, mass effect, evidence of mass lesion, acute ventriculomegaly, extra-axial collection or acute intracranial hemorrhage. Cervicomedullary junction and pituitary are within normal limits. Vascular: Major intracranial vascular flow voids are stable when allowing for motion degraded axial T2 imaging today. Skull and upper cervical spine: Motion degraded upper cervical spine on sagittal T1 today. Visualized bone marrow signal is within normal limits. Sinuses/Orbits: Ongoing rightward gaze. Paranasal Visualized paranasal sinuses and mastoids are stable and well aerated. Other: Negative visible scalp and face. IMPRESSION: 1. Small acute posterior left frontal lobe subcortical white matter infarct, near the left centrum semiovale. And contralateral posterior right frontal lobe white matter infarct which appears recurrent for extended from similar ischemia in April. No associated hemorrhage or mass effect. 2. Otherwise stable advanced chronic ischemic changes in the brain, disproportionately affecting the PCA territories. Electronically Signed   By: Odessa Fleming M.D.   On: 02/25/2023 06:06   CT ANGIO HEAD NECK W WO CM W PERF (CODE STROKE)  Result Date: 02/25/2023 CLINICAL DATA:  Right-sided gaze deviation EXAM: CT ANGIOGRAPHY HEAD AND NECK CT PERFUSION BRAIN TECHNIQUE: Multidetector CT imaging of the head and neck was performed using the standard protocol during bolus administration of intravenous contrast. Multiplanar CT image reconstructions and MIPs were obtained to evaluate the vascular anatomy. Carotid stenosis measurements (when applicable) are obtained utilizing NASCET criteria, using the distal internal carotid diameter as the denominator. Multiphase CT imaging of the brain was performed following IV bolus contrast injection. Subsequent parametric perfusion maps were calculated using RAPID software. RADIATION DOSE REDUCTION: This  exam was performed according to the departmental dose-optimization program which includes automated exposure control, adjustment of the mA and/or kV according to patient size and/or use of iterative reconstruction technique. CONTRAST:  OMNIPAQUE IOHEXOL 350 MG/ML SOLN COMPARISON:  CT head 02/25/2023, correlation is also made with CTA head and neck 01/01/2023 FINDINGS: CT HEAD FINDINGS For noncontrast findings, please see same day CT head. CTA NECK FINDINGS Aortic arch: Standard branching. Imaged portion shows no evidence of aneurysm or dissection. No significant stenosis of the major arch vessel origins. Right carotid system: No evidence  of dissection, occlusion, or hemodynamically significant stenosis (greater than 50%). Left carotid system: No evidence of dissection, occlusion, or hemodynamically significant stenosis (greater than 50%). Vertebral arteries: No evidence of dissection, occlusion, or hemodynamically significant stenosis (greater than 50%). Skeleton: No acute osseous abnormality. Degenerative changes in the cervical spine. Other neck: Hypoenhancing nodules in the thyroid, the largest of which measures up to 9 mm, for which no follow-up is currently indicated. (Reference: J Am Coll Radiol. 2015 Feb;12(2): 143-50) Upper chest: No focal pulmonary opacity or pleural effusion. Review of the MIP images confirms the above findings CTA HEAD FINDINGS Anterior circulation: Both internal carotid arteries are patent to the termini, with calcifications but without significant stenosis. Patent right A1. Aplastic left A1. Normal anterior communicating artery. Redemonstrated short segment occlusion of left A2 (series 7, image 108), with additional areas of focal moderate stenosis more distally (series 7, image 96), unchanged. Anterior cerebral arteries are otherwise patent to their distal aspects without significant stenosis. Severe stenosis in the distal M1 segments (series 7, images 114 and 118). Additional  multifocal severe stenosis throughout the bilateral MCA branches, similar to prior. Posterior circulation: Vertebral arteries patent to the vertebrobasilar junction without significant stenosis. Posterior inferior cerebellar arteries patent proximally. Basilar patent to its distal aspect without significant stenosis. Superior cerebellar arteries patent proximally. Redemonstrated occlusion of the left PCA. Redemonstrated multifocal moderate to severe stenosis in the right PCA branches. Venous sinuses: Not well opacified due to arterial timing. Anatomic variants: None significant. Review of the MIP images confirms the above findings CT Brain Perfusion Findings: ASPECTS: 10 CBF (<30%) Volume: 9mL Perfusion (Tmax>6.0s) volume: 20mL Mismatch Volume: 11mL Infarction Location:The infarct core is in the left parietal lobe, with the penumbra in the left parietal and left frontal lobe. Some of this may correlate with encephalomalacia in the left PCA territory. The left frontal lobe penumbra may be artifactual, given motion. IMPRESSION: 1. No new intracranial large vessel occlusion. Redemonstrated short segment occlusion of the left A2, with additional areas of focal moderate stenosis more distally in the ACAs. 2. Redemonstrated occlusion of the left PCA with multifocal moderate to severe stenosis in the right PCA. 3. Multifocal severe stenosis throughout the bilateral MCA branches. 4. No hemodynamically significant stenosis in the neck. 5. Perfusion imaging demonstrates an infarct core in the left parietal lobe, with the number in the left parietal and left frontal lobe. The left parietal findings likely correlate with the chronic encephalomalacia in the left PCA territory. In the inferior left frontal lobe penumbra may be artifactual, secondary to motion. Electronically Signed   By: Wiliam Ke M.D.   On: 02/25/2023 03:07   DG Chest Portable 1 View  Result Date: 02/25/2023 CLINICAL DATA:  Status post central line  placement EXAM: PORTABLE CHEST 1 VIEW COMPARISON:  01/01/2019 FINDINGS: Cardiac shadow is within normal limits. Lungs are well aerated bilaterally. Right jugular central line is noted with the tip at the inferior cavoatrial junction. This should be withdrawn several cm. No pneumothorax is seen. No bony abnormality is noted. IMPRESSION: Status post central line placement without evidence of pneumothorax. Catheter should be withdrawn several cm. Electronically Signed   By: Alcide Clever M.D.   On: 02/25/2023 02:27   CT HEAD CODE STROKE WO CONTRAST  Result Date: 02/25/2023 CLINICAL DATA:  Code stroke.  Stroke suspected, nonverbal EXAM: CT HEAD WITHOUT CONTRAST TECHNIQUE: Contiguous axial images were obtained from the base of the skull through the vertex without intravenous contrast. RADIATION DOSE REDUCTION: This exam was  performed according to the departmental dose-optimization program which includes automated exposure control, adjustment of the mA and/or kV according to patient size and/or use of iterative reconstruction technique. COMPARISON:  01/21/2023 FINDINGS: Brain: No evidence of acute infarction, hemorrhage, mass, mass effect, or midline shift. No hydrocephalus or extra-axial collection. Redemonstrated remote infarct involving the left PCA territory. Periventricular white matter changes, likely the sequela of chronic small vessel ischemic disease. Vascular: No hyperdense vessel. Atherosclerotic calcifications in the intracranial carotid and vertebral arteries. Skull: Negative for fracture or focal lesion. Sinuses/Orbits: Small right maxillary mucous retention cyst. Otherwise clear paranasal sinuses. No acute finding in the orbits. Other: The mastoid air cells are well aerated. ASPECTS [facility] Stroke Program Early CT Score) - Ganglionic level infarction (caudate, lentiform nuclei, internal capsule, insula, M1-M3 cortex): 7 - Supraganglionic infarction (M4-M6 cortex): 3 Total score (0-10 with 10 being  normal): 10 IMPRESSION: 1. No acute intracranial process. ASPECTS is 10. 2. Remote left PCA territory infarct. Code stroke imaging results were communicated on 02/25/2023 at 12:48 am to provider [facility] via telephone, who verbally acknowledged these results. Electronically Signed   By: Wiliam Ke M.D.   On: 02/25/2023 00:49   DG Abdomen 1 View  Result Date: 02/24/2023 CLINICAL DATA:  62 year old female with history of constipation. EXAM: ABDOMEN - 1 VIEW COMPARISON:  Abdominal radiograph 11/21/2022. FINDINGS: Gas and stool are seen scattered throughout the colon extending to the level of the distal rectum. No pathologic distension of small bowel is noted. Large volume of stool in the colon and rectum. No gross evidence of pneumoperitoneum. IMPRESSION: 1. Nonobstructive bowel gas pattern. 2. No pneumoperitoneum. 3. Relatively large volume of stool throughout the colon and rectum compatible with reported clinical history of constipation. Electronically Signed   By: Trudie Reed M.D.   On: 02/24/2023 06:15   (Echo, Carotid, EGD, Colonoscopy, ERCP)    Subjective: Pt c/o malaise    Discharge Exam: Vitals:   03/12/23 0525 03/12/23 0754  BP: 124/67 (!) 140/61  Pulse: 79 74  Resp: 18 18  Temp: 98 F (36.7 C) 97.7 F (36.5 C)  SpO2: 99% 100%   Vitals:   03/11/23 1635 03/11/23 2001 03/12/23 0525 03/12/23 0754  BP: (!) 147/69 (!) 140/63 124/67 (!) 140/61  Pulse: 76 84 79 74  Resp: 16 15 18 18   Temp: 98 F (36.7 C) 98.6 F (37 C) 98 F (36.7 C) 97.7 F (36.5 C)  TempSrc:  Oral Oral Oral  SpO2: 100% 99% 99% 100%  Weight:      Height:        General: Pt is alert, awake, not in acute distress Cardiovascular: S1/S2 +, no rubs, no gallops Respiratory: CTA bilaterally, no wheezing, no rhonchi Abdominal: Soft, NT, ND, bowel sounds + Extremities: no cyanosis    The results of significant diagnostics from this hospitalization (including imaging, microbiology, ancillary and laboratory)  are listed below for reference.     Microbiology: No results found for this or any previous visit (from the past 240 hour(s)).   Labs: BNP (last 3 results) Recent Labs    10/14/22 1429  BNP 28.2   Basic Metabolic Panel: Recent Labs  Lab 03/08/23 0335 03/09/23 0350 03/10/23 0355 03/11/23 0434 03/12/23 0558  NA 136 140 138 137 135  K 4.6 4.8 4.4 4.8 5.3*  CL 106 108 108 105 106  CO2 24 25 25 24  20*  GLUCOSE 203* 256* 179* 226* 291*  BUN 40* 46* 52* 54* 56*  CREATININE 1.94* 1.87*  1.73* 1.93* 1.86*  CALCIUM 8.9 8.8* 8.8* 8.5* 8.5*   Liver Function Tests: Recent Labs  Lab 03/07/23 0818  AST 59*  ALT 43  ALKPHOS 90  BILITOT 0.3  PROT 6.5  ALBUMIN 2.8*   No results for input(s): "LIPASE", "AMYLASE" in the last 168 hours. No results for input(s): "AMMONIA" in the last 168 hours. CBC: Recent Labs  Lab 03/08/23 0335 03/09/23 0350 03/10/23 0355 03/11/23 0434 03/12/23 0558  WBC 5.6 5.4 5.4 5.6 5.9  HGB 10.2* 9.5* 9.6* 9.2* 9.8*  HCT 31.4* 28.7* 29.3* 28.7* 28.2*  MCV 88.7 88.6 88.5 89.4 83.4  PLT 319 294 290 277 273   Cardiac Enzymes: No results for input(s): "CKTOTAL", "CKMB", "CKMBINDEX", "TROPONINI" in the last 168 hours. BNP: Invalid input(s): "POCBNP" CBG: Recent Labs  Lab 03/11/23 0748 03/11/23 1104 03/11/23 1637 03/11/23 2009 03/12/23 0757  GLUCAP 221* 273* 280* 220* 269*   D-Dimer No results for input(s): "DDIMER" in the last 72 hours. Hgb A1c No results for input(s): "HGBA1C" in the last 72 hours. Lipid Profile No results for input(s): "CHOL", "HDL", "LDLCALC", "TRIG", "CHOLHDL", "LDLDIRECT" in the last 72 hours. Thyroid function studies No results for input(s): "TSH", "T4TOTAL", "T3FREE", "THYROIDAB" in the last 72 hours.  Invalid input(s): "FREET3" Anemia work up No results for input(s): "VITAMINB12", "FOLATE", "FERRITIN", "TIBC", "IRON", "RETICCTPCT" in the last 72 hours. Urinalysis    Component Value Date/Time   COLORURINE YELLOW  (A) 02/25/2023 0530   APPEARANCEUR HAZY (A) 02/25/2023 0530   APPEARANCEUR Clear 12/06/2014 0133   LABSPEC 1.041 (H) 02/25/2023 0530   LABSPEC 1.011 12/06/2014 0133   PHURINE 5.0 02/25/2023 0530   GLUCOSEU >=500 (A) 02/25/2023 0530   GLUCOSEU >=500 12/06/2014 0133   HGBUR NEGATIVE 02/25/2023 0530   BILIRUBINUR NEGATIVE 02/25/2023 0530   BILIRUBINUR Negative 12/06/2014 0133   KETONESUR 5 (A) 02/25/2023 0530   PROTEINUR 100 (A) 02/25/2023 0530   NITRITE NEGATIVE 02/25/2023 0530   LEUKOCYTESUR NEGATIVE 02/25/2023 0530   LEUKOCYTESUR Negative 12/06/2014 0133   Sepsis Labs Recent Labs  Lab 03/09/23 0350 03/10/23 0355 03/11/23 0434 03/12/23 0558  WBC 5.4 5.4 5.6 5.9   Microbiology No results found for this or any previous visit (from the past 240 hour(s)).   Time coordinating discharge: Over 30 minutes  SIGNED:   Charise Killian, MD  [facility] 03/12/2023, 11:05 AM Pager   If 7PM-7AM, please contact night-coverage [email or web address]

## 2023-03-12 NOTE — TOC Transition Note (Signed)
Transition of Care [facility]) - CM/SW Discharge Note   Patient Details  Name: [name] Gilbert MRN: [id number] Date of Birth: [birth year]  [facility]) CM/SW Contact:  Garret Reddish, RN Phone Number: 03/12/2023, 11:13 AM   Clinical Narrative:   Chart reviewed.  Noted that Francine Graven has overturned SNF appeal.  Francine Graven has approved SNF stay from 03-05-2023- 03-14-2023.  Next review date will be 03-14-2023.  Plan auth ID- [id number] Auth ID- [id number].   I have informed patient's husband Mr. [name] Gilbert that patient has been approved for SNF placement.   I have informed Marcelino Duster with [facility] that patient has been approved for SNF.  I have given her the above SNF approval information.  Marcelino Duster reports that she can accept patient to the facility today.    I have also made Dr. Mayford Knife aware.    I will arranged [facility] to transport patient  to [facility] today.  I have informed staff nurse of the above information.     Final next level of care: Skilled Nursing Facility Barriers to Discharge: No Barriers Identified   Patient Goals and CMS Choice CMS Medicare.gov Compare Post Acute Care list provided to:: Patient Represenative (must comment) (Patient's husband [name] Gilbert) Choice offered to / list presented to : Spouse  Discharge Placement                Patient chooses bed at: [facility] Patient to be transferred to facility by: [facility] Name of family member notified: [name] Gilbert Patient and family notified of of transfer: 03/12/23  Discharge Plan and Services Additional resources added to the After Visit Summary for                                       Social Determinants of Health (SDOH) Interventions SDOH Screenings   Food Insecurity: No Food Insecurity (02/25/2023)  Housing: Low Risk  (02/25/2023)  Transportation Needs: No Transportation Needs (02/25/2023)  Utilities: Not At Risk (02/25/2023)  Tobacco Use: Low Risk  (02/25/2023)      Readmission Risk Interventions     No data to display

## 2023-03-12 NOTE — TOC Progression Note (Signed)
Transition of Care [facility]) - Progression Note    Patient Details  Name: [name] Gilbert MRN: [id number] Date of Birth: [birth year]  Transition [facility] Care [facility]) CM/SW Contact  Garret Reddish, RN Phone Number: 03/12/2023, 11:53 AM  Clinical Narrative:   Marcelino Duster with [facility] informs me that patient will be going to room 1203 and the number to call report will be [phone].  I have informed staff nurse of the above information.    I will arrange EMS via [facility] transport for patient to transport to the facility.   I have informed patient's husband of the above information.      Expected Discharge Plan: Skilled Nursing Facility Barriers to Discharge: No Barriers Identified  Expected Discharge Plan and Services         Expected Discharge Date: 03/12/23                                     Social Determinants of Health (SDOH) Interventions SDOH Screenings   Food Insecurity: No Food Insecurity (02/25/2023)  Housing: Low Risk  (02/25/2023)  Transportation Needs: No Transportation Needs (02/25/2023)  Utilities: Not At Risk (02/25/2023)  Tobacco Use: Low Risk  (02/25/2023)    Readmission Risk Interventions     No data to display

## 2023-03-14 ENCOUNTER — Emergency Department: Payer: Medicare HMO

## 2023-03-14 ENCOUNTER — Emergency Department
Admission: EM | Admit: 2023-03-14 | Discharge: 2023-03-15 | Disposition: A | Discharge status: Home / Self Care | Payer: Medicare HMO | Attending: Emergency Medicine | Admitting: Emergency Medicine

## 2023-03-14 DIAGNOSIS — K5641 Fecal impaction: Secondary | ICD-10-CM | POA: Insufficient documentation

## 2023-03-14 DIAGNOSIS — K5900 Constipation, unspecified: Secondary | ICD-10-CM | POA: Diagnosis not present

## 2023-03-14 DIAGNOSIS — N1832 Chronic kidney disease, stage 3b: Secondary | ICD-10-CM | POA: Insufficient documentation

## 2023-03-14 DIAGNOSIS — E1122 Type 2 diabetes mellitus with diabetic chronic kidney disease: Secondary | ICD-10-CM | POA: Diagnosis not present

## 2023-03-14 DIAGNOSIS — R1084 Generalized abdominal pain: Secondary | ICD-10-CM | POA: Diagnosis present

## 2023-03-14 DIAGNOSIS — I129 Hypertensive chronic kidney disease with stage 1 through stage 4 chronic kidney disease, or unspecified chronic kidney disease: Secondary | ICD-10-CM | POA: Insufficient documentation

## 2023-03-14 LAB — URINALYSIS, ROUTINE W REFLEX MICROSCOPIC
Bacteria, UA: NONE SEEN
Bilirubin Urine: NEGATIVE
Glucose, UA: NEGATIVE mg/dL
Ketones, ur: NEGATIVE mg/dL
Leukocytes,Ua: NEGATIVE
Nitrite: NEGATIVE
Protein, ur: 300 mg/dL — AB
RBC / HPF: >50 RBC/hpf (ref 0–5)
Specific Gravity, Urine: 1.02 (ref 1.005–1.030)
pH: 6 (ref 5.0–8.0)

## 2023-03-14 LAB — CBC WITH DIFFERENTIAL/PLATELET
Abs Immature Granulocytes: 0.01 10*3/uL (ref 0.00–0.07)
Basophils Absolute: 0 10*3/uL (ref 0.0–0.1)
Basophils Relative: 0 %
Eosinophils Absolute: 0 10*3/uL (ref 0.0–0.5)
Eosinophils Relative: 1 %
HCT: 35.9 % — ABNORMAL LOW (ref 36.0–46.0)
Hemoglobin: 11.7 g/dL — ABNORMAL LOW (ref 12.0–15.0)
Immature Granulocytes: 0 %
Lymphocytes Relative: 24 %
Lymphs Abs: 1.9 10*3/uL (ref 0.7–4.0)
MCH: 29 pg (ref 26.0–34.0)
MCHC: 32.6 g/dL (ref 30.0–36.0)
MCV: 88.9 fL (ref 80.0–100.0)
Monocytes Absolute: 0.3 10*3/uL (ref 0.1–1.0)
Monocytes Relative: 4 %
Neutro Abs: 5.4 10*3/uL (ref 1.7–7.7)
Neutrophils Relative %: 71 %
Platelets: 327 10*3/uL (ref 150–400)
RBC: 4.04 MIL/uL (ref 3.87–5.11)
RDW: 14.8 % (ref 11.5–15.5)
WBC: 7.6 10*3/uL (ref 4.0–10.5)
nRBC: 0 % (ref 0.0–0.2)

## 2023-03-14 LAB — COMPREHENSIVE METABOLIC PANEL
ALT: 48 U/L — ABNORMAL HIGH (ref 0–44)
AST: 35 U/L (ref 15–41)
Albumin: 3.8 g/dL (ref 3.5–5.0)
Alkaline Phosphatase: 120 U/L (ref 38–126)
Anion gap: 12 (ref 5–15)
BUN: 36 mg/dL — ABNORMAL HIGH (ref 8–23)
CO2: 23 mmol/L (ref 22–32)
Calcium: 9.2 mg/dL (ref 8.9–10.3)
Chloride: 103 mmol/L (ref 98–111)
Creatinine, Ser: 1.72 mg/dL — ABNORMAL HIGH (ref 0.44–1.00)
GFR, Estimated: 33 mL/min — ABNORMAL LOW (ref >60)
Glucose, Bld: 193 mg/dL — ABNORMAL HIGH (ref 70–99)
Potassium: 4.4 mmol/L (ref 3.5–5.1)
Sodium: 138 mmol/L (ref 135–145)
Total Bilirubin: 0.5 mg/dL (ref 0.3–1.2)
Total Protein: 8.7 g/dL — ABNORMAL HIGH (ref 6.5–8.1)

## 2023-03-14 MED ORDER — GLYCERIN (LAXATIVE) 2.1 G RE SUPP: 1.0000 | RECTAL | Status: DC | PRN

## 2023-03-14 MED ORDER — HYDROCODONE-ACETAMINOPHEN 5-325 MG PO TABS
1.0000 | ORAL_TABLET | Freq: Once | ORAL | Status: AC
  Administered 2023-03-14: 1 via ORAL
  Filled 2023-03-14: qty 1

## 2023-03-14 MED ORDER — POLYETHYLENE GLYCOL 3350 17 G PO PACK
17.0000 g | PACK | Freq: Every day | ORAL | Status: DC
  Administered 2023-03-14: 17 g via ORAL
  Filled 2023-03-14: qty 1

## 2023-03-14 MED ORDER — FLEET ENEMA 7-19 GM/118ML RE ENEM
1.0000 | ENEMA | Freq: Once | RECTAL | Status: AC
  Administered 2023-03-14: 1 via RECTAL

## 2023-03-14 NOTE — ED Notes (Signed)
This RN gave report back to [place] at [facility].

## 2023-03-14 NOTE — Discharge Instructions (Signed)
Your exam and labs are reassuring at this time.  No signs of a UTI.  You do have x-ray evidence of a large stool burden in the last portion of the colon and rectum.  We performed a manual disimpaction in the ED today.  You must take MiraLAX twice daily to help promote normal soft stools.  Drink plenty of fluids to help prevent dehydration.  You should follow-up with your primary provider for ongoing concerns.  Return to the ED if needed.

## 2023-03-14 NOTE — ED Triage Notes (Signed)
Pt from [facility] was seen her this weekend, and return for constipation and abdominal pain. Per nursing home they believe she is impacted.

## 2023-03-14 NOTE — ED Provider Notes (Signed)
[facility] Emergency Department Provider Note     Event Date/Time   First MD Initiated Contact with Patient 03/14/23 1504     (approximate)   History   Abdominal Pain   HPI  [name] Gilbert is a 62 y.o. female with a medical history significant for type 2 diabetes mellitus, with stage IIIb chronic kidney disease, seizure disorder, hypertension and CVA, and left hemiparesis, presents to the ED via EMS from her facility, [facility].  Patient presents to the ED for complaints of abdominal pain and constipation.  She describes generalized abdominal discomfort and a history of poor p.o. intake.  Nursing home staff believes the patient may be impacted.  Patient is minimally mobile spends her day in incontinence briefs, and relies on staff for care.  She admits to not eating or drinking as much and this is also endorsed by her husband.  Reports limited cough, congestion, chest pain, or shortness of breath.  No nausea, vomiting, or diarrhea reported.  Patient had an ED admission last month for acute aphasia.   Physical Exam   Triage Vital Signs: ED Triage Vitals  Enc Vitals Group     BP 03/14/23 1503 135/76     Pulse Rate 03/14/23 1503 (!) 118     Resp 03/14/23 1503 16     Temp 03/14/23 1502 98 F (36.7 C)     Temp Source 03/14/23 1502 Oral     SpO2 03/14/23 1503 97 %     Weight 03/14/23 1501 138 lb 14.2 oz (63 kg)     Height 03/14/23 1501 5\' 2"  (1.575 m)     Head Circumference --      Peak Flow --      Pain Score --      Pain Loc --      Pain Edu? --      Excl. in GC? --     Most recent vital signs: Vitals:   03/14/23 1800 03/14/23 1930  BP: 103/74   Pulse: 93   Resp: 11 12  Temp:    SpO2: 98%     General Awake, no distress. NAD HEENT NCAT. PERRL. EOMI. No rhinorrhea. Mucous membranes are moist.  CV:  Good peripheral perfusion.  RESP:  Normal effort. CTA ABD:  No distention. Soft, nontender to palp. Normal bowel sounds. No rebound,  guarding, or rigidity.  Rectal exam reveals firm stool coming from the rectum.  Manual disimpaction performed with significant removal of claylike stool and multiple formed balls.  Some minimal bright red blood per rectum noted following procedure.   ED Results / Procedures / Treatments   Labs (all labs ordered are listed, but only abnormal results are displayed) Labs Reviewed  CBC WITH DIFFERENTIAL/PLATELET - Abnormal; Notable for the following components:      Result Value   Hemoglobin 11.7 (*)    HCT 35.9 (*)    All other components within normal limits  COMPREHENSIVE METABOLIC PANEL - Abnormal; Notable for the following components:   Glucose, Bld 193 (*)    BUN 36 (*)    Creatinine, Ser 1.72 (*)    Total Protein 8.7 (*)    ALT 48 (*)    GFR, Estimated 33 (*)    All other components within normal limits  URINALYSIS, ROUTINE W REFLEX MICROSCOPIC - Abnormal; Notable for the following components:   Color, Urine YELLOW (*)    APPearance CLOUDY (*)    Hgb urine dipstick MODERATE (*)  Protein, ur 300 (*)    All other components within normal limits     EKG   RADIOLOGY  I personally viewed and evaluated these images as part of my medical decision making, as well as reviewing the written report by the radiologist.  ED Provider Interpretation: Left-sided colonic and rectal stool burden  DG Abdomen 1 View  Result Date: 03/14/2023 CLINICAL DATA:  Constipation for a week.  Abdominal pain EXAM: ABDOMEN - 1 VIEW COMPARISON:  X-ray 02/24/2023 FINDINGS: Gas seen in nondilated loops of small and large bowel. Air in the stomach. There is significant stool along the left side of the colon with some high density as well as significant stool in the rectum. No obstruction. No obvious free air on these supine portable radiographs. No abnormal calcifications. Overlapping cardiac leads. Presumed vascular calcifications in the pelvis. IMPRESSION: Nonspecific bowel gas pattern with significant  colonic stool distally. Electronically Signed   By: Karen Kays M.D.   On: 03/14/2023 15:36     PROCEDURES:  Critical Care performed: No  Procedures   MEDICATIONS ORDERED IN ED: Medications  polyethylene glycol (MIRALAX / GLYCOLAX) packet 17 g (17 g Oral Given 03/14/23 1619)  Glycerin (Adult) 2.1 g suppository 1 suppository (has no administration in time range)  HYDROcodone-acetaminophen (NORCO/VICODIN) 5-325 MG per tablet 1 tablet (1 tablet Oral Given 03/14/23 1534)  sodium phosphate (FLEET) 7-19 GM/118ML enema 1 enema (1 enema Rectal Given 03/14/23 1700)     IMPRESSION / MDM / ASSESSMENT AND PLAN / ED COURSE  I reviewed the triage vital signs and the nursing notes.                              Differential diagnosis includes, but is not limited to, SBO, constipation, rectal impaction, gastroparesis  Patient's presentation is most consistent with acute complicated illness / injury requiring diagnostic workup.  Patient's diagnosis is consistent with chronic constipation with rectal impaction.  Patient with reassuring exam and workup overall.  No acute lab abnormalities noted.  She has no evidence of a UTI as her urine sample is without dysuria.  Patient will be discharged home with directions to take OTC MiraLAX twice daily.  She is also encouraged to increase her fluid intake to prevent constipation. Patient is to follow up with primary provider as needed or otherwise directed. Patient is given ED precautions to return to the ED for any worsening or new symptoms.  FINAL CLINICAL IMPRESSION(S) / ED DIAGNOSES   Final diagnoses:  Constipation, unspecified constipation type  Fecal impaction in rectum [facility])     Rx / DC Orders   ED Discharge Orders     None        Note:  This document was prepared using Dragon voice recognition software and may include unintentional dictation errors.    Lissa Hoard, PA-C 03/14/23 2021    Sharyn Creamer, MD 03/15/23 [id number]

## 2023-03-14 NOTE — ED Notes (Signed)
This RN attempted to call [facility] no answer

## 2023-03-15 NOTE — ED Notes (Signed)
[facility] here to pick up patient for transport back to SNF at this time. All documentation including DNR given to EMS.

## 2023-08-30 ENCOUNTER — Other Ambulatory Visit: Payer: Self-pay

## 2023-08-30 ENCOUNTER — Emergency Department

## 2023-08-30 ENCOUNTER — Inpatient Hospital Stay
Admission: EM | Admit: 2023-08-30 | Discharge: 2023-09-03 | DRG: 065 | Disposition: A | Discharge status: Hospice | Attending: Hospitalist | Admitting: Hospitalist

## 2023-08-30 DIAGNOSIS — I639 Cerebral infarction, unspecified: Secondary | ICD-10-CM | POA: Diagnosis present

## 2023-08-30 DIAGNOSIS — Z515 Encounter for palliative care: Secondary | ICD-10-CM | POA: Diagnosis not present

## 2023-08-30 DIAGNOSIS — N1832 Chronic kidney disease, stage 3b: Secondary | ICD-10-CM | POA: Diagnosis not present

## 2023-08-30 DIAGNOSIS — Z66 Do not resuscitate: Secondary | ICD-10-CM | POA: Diagnosis present

## 2023-08-30 DIAGNOSIS — G40909 Epilepsy, unspecified, not intractable, without status epilepticus: Secondary | ICD-10-CM

## 2023-08-30 DIAGNOSIS — N184 Chronic kidney disease, stage 4 (severe): Secondary | ICD-10-CM | POA: Diagnosis present

## 2023-08-30 DIAGNOSIS — R4701 Aphasia: Secondary | ICD-10-CM | POA: Diagnosis present

## 2023-08-30 DIAGNOSIS — I129 Hypertensive chronic kidney disease with stage 1 through stage 4 chronic kidney disease, or unspecified chronic kidney disease: Secondary | ICD-10-CM | POA: Diagnosis present

## 2023-08-30 DIAGNOSIS — Z888 Allergy status to other drugs, medicaments and biological substances status: Secondary | ICD-10-CM | POA: Diagnosis not present

## 2023-08-30 DIAGNOSIS — I10 Essential (primary) hypertension: Secondary | ICD-10-CM | POA: Diagnosis not present

## 2023-08-30 DIAGNOSIS — I69398 Other sequelae of cerebral infarction: Secondary | ICD-10-CM | POA: Diagnosis not present

## 2023-08-30 DIAGNOSIS — K7460 Unspecified cirrhosis of liver: Secondary | ICD-10-CM | POA: Diagnosis present

## 2023-08-30 DIAGNOSIS — R4182 Altered mental status, unspecified: Secondary | ICD-10-CM

## 2023-08-30 DIAGNOSIS — E785 Hyperlipidemia, unspecified: Secondary | ICD-10-CM | POA: Diagnosis present

## 2023-08-30 DIAGNOSIS — G9349 Other encephalopathy: Secondary | ICD-10-CM | POA: Diagnosis present

## 2023-08-30 DIAGNOSIS — I63522 Cerebral infarction due to unspecified occlusion or stenosis of left anterior cerebral artery: Principal | ICD-10-CM | POA: Diagnosis present

## 2023-08-30 DIAGNOSIS — Z794 Long term (current) use of insulin: Secondary | ICD-10-CM | POA: Diagnosis not present

## 2023-08-30 DIAGNOSIS — Z833 Family history of diabetes mellitus: Secondary | ICD-10-CM

## 2023-08-30 DIAGNOSIS — Z79899 Other long term (current) drug therapy: Secondary | ICD-10-CM

## 2023-08-30 DIAGNOSIS — N183 Chronic kidney disease, stage 3 unspecified: Secondary | ICD-10-CM

## 2023-08-30 DIAGNOSIS — Z885 Allergy status to narcotic agent status: Secondary | ICD-10-CM | POA: Diagnosis not present

## 2023-08-30 DIAGNOSIS — I6389 Other cerebral infarction: Secondary | ICD-10-CM | POA: Diagnosis not present

## 2023-08-30 DIAGNOSIS — Z880 Allergy status to penicillin: Secondary | ICD-10-CM

## 2023-08-30 DIAGNOSIS — E1122 Type 2 diabetes mellitus with diabetic chronic kidney disease: Secondary | ICD-10-CM

## 2023-08-30 DIAGNOSIS — Z90710 Acquired absence of both cervix and uterus: Secondary | ICD-10-CM

## 2023-08-30 DIAGNOSIS — I69320 Aphasia following cerebral infarction: Secondary | ICD-10-CM | POA: Diagnosis not present

## 2023-08-30 DIAGNOSIS — I69351 Hemiplegia and hemiparesis following cerebral infarction affecting right dominant side: Secondary | ICD-10-CM | POA: Diagnosis not present

## 2023-08-30 DIAGNOSIS — Z7401 Bed confinement status: Secondary | ICD-10-CM

## 2023-08-30 DIAGNOSIS — E1129 Type 2 diabetes mellitus with other diabetic kidney complication: Secondary | ICD-10-CM | POA: Diagnosis present

## 2023-08-30 LAB — COMPREHENSIVE METABOLIC PANEL
ALT: 10 U/L (ref 0–44)
AST: 22 U/L (ref 15–41)
Albumin: 3.8 g/dL (ref 3.5–5.0)
Alkaline Phosphatase: 88 U/L (ref 38–126)
Anion gap: 13 (ref 5–15)
BUN: 28 mg/dL — ABNORMAL HIGH (ref 8–23)
CO2: 25 mmol/L (ref 22–32)
Calcium: 9.8 mg/dL (ref 8.9–10.3)
Chloride: 102 mmol/L (ref 98–111)
Creatinine, Ser: 1.57 mg/dL — ABNORMAL HIGH (ref 0.44–1.00)
GFR, Estimated: 37 mL/min — ABNORMAL LOW (ref >60)
Glucose, Bld: 249 mg/dL — ABNORMAL HIGH (ref 70–99)
Potassium: 3.8 mmol/L (ref 3.5–5.1)
Sodium: 140 mmol/L (ref 135–145)
Total Bilirubin: 1.5 mg/dL — ABNORMAL HIGH (ref <1.2)
Total Protein: 8.6 g/dL — ABNORMAL HIGH (ref 6.5–8.1)

## 2023-08-30 LAB — LACTIC ACID, PLASMA
Lactic Acid, Venous: 2 mmol/L (ref 0.5–1.9)
Lactic Acid, Venous: 1.7 mmol/L (ref 0.5–1.9)

## 2023-08-30 LAB — CBC
HCT: 41.7 % (ref 36.0–46.0)
Hemoglobin: 14 g/dL (ref 12.0–15.0)
MCH: 29 pg (ref 26.0–34.0)
MCHC: 33.6 g/dL (ref 30.0–36.0)
MCV: 86.3 fL (ref 80.0–100.0)
Platelets: 293 10*3/uL (ref 150–400)
RBC: 4.83 MIL/uL (ref 3.87–5.11)
RDW: 14.3 % (ref 11.5–15.5)
WBC: 7 10*3/uL (ref 4.0–10.5)
nRBC: 0 % (ref 0.0–0.2)

## 2023-08-30 LAB — CBG MONITORING, ED: Glucose-Capillary: 243 mg/dL — ABNORMAL HIGH (ref 70–99)

## 2023-08-30 MED ORDER — STROKE: EARLY STAGES OF RECOVERY BOOK: Freq: Once | Status: DC

## 2023-08-30 MED ORDER — LABETALOL HCL 5 MG/ML IV SOLN
10.0000 mg | Freq: Once | INTRAVENOUS | Status: AC
  Administered 2023-08-30: 10 mg via INTRAVENOUS
  Filled 2023-08-30: qty 4

## 2023-08-30 MED ORDER — ACETAMINOPHEN 325 MG PO TABS: 650.0000 mg | ORAL_TABLET | ORAL | Status: DC | PRN

## 2023-08-30 MED ORDER — SODIUM CHLORIDE 0.9 % IV SOLN: INTRAVENOUS | Status: DC

## 2023-08-30 MED ORDER — SODIUM CHLORIDE 0.9 % IV BOLUS: 1000.0000 mL | Freq: Once | INTRAVENOUS | Status: DC

## 2023-08-30 MED ORDER — ACETAMINOPHEN 10 MG/ML IV SOLN
1000.0000 mg | Freq: Once | INTRAVENOUS | Status: AC
  Administered 2023-08-30: 1000 mg via INTRAVENOUS
  Filled 2023-08-30: qty 100

## 2023-08-30 MED ORDER — LACTATED RINGERS IV BOLUS
1000.0000 mL | Freq: Once | INTRAVENOUS | Status: AC
  Administered 2023-08-30: 1000 mL via INTRAVENOUS

## 2023-08-30 MED ORDER — ACETAMINOPHEN 650 MG RE SUPP
650.0000 mg | RECTAL | Status: DC | PRN
  Administered 2023-09-01 – 2023-09-02 (×2): 650 mg via RECTAL
  Filled 2023-08-30: qty 2
  Filled 2023-08-30: qty 1

## 2023-08-30 MED ORDER — HEPARIN SODIUM (PORCINE) 5000 UNIT/ML IJ SOLN
5000.0000 [IU] | Freq: Three times a day (TID) | INTRAMUSCULAR | Status: DC
  Administered 2023-08-31 (×3): 5000 [IU] via SUBCUTANEOUS
  Filled 2023-08-30 (×3): qty 1

## 2023-08-30 MED ORDER — ACETAMINOPHEN 160 MG/5ML PO SOLN: 650.0000 mg | ORAL | Status: DC | PRN

## 2023-08-30 MED ORDER — INSULIN ASPART 100 UNIT/ML IJ SOLN
0.0000 [IU] | INTRAMUSCULAR | Status: DC
  Administered 2023-08-30: 5 [IU] via SUBCUTANEOUS
  Administered 2023-08-31: 3 [IU] via SUBCUTANEOUS
  Administered 2023-08-31: 5 [IU] via SUBCUTANEOUS
  Filled 2023-08-30 (×3): qty 1

## 2023-08-30 NOTE — ED Notes (Signed)
Attempted to in/out cath pt without success. Dr Vicente Males made aware fluids were ordered.

## 2023-08-30 NOTE — ED Provider Notes (Signed)
[facility] Provider Note   Event Date/Time   First MD Initiated Contact with Patient 08/30/23 1823     (approximate) History  Altered Mental Status  HPI [name] Gilbert is a 62 y.o. female with a stated past medical history of CVA with left-sided and right-sided deficits who presents with her son and caregiver who states that she has had decreased activity as well as become minimally verbal over the last week.  States that patient's last known well was at least 4 days prior to arrival.  He has also noticed patient's right upper extremity to be more contracted than usual ROS: Unable to assess   Physical Exam  Triage Vital Signs: ED Triage Vitals [08/30/23 1657]  Encounter Vitals Group     BP (!) 132/99     Systolic BP Percentile      Diastolic BP Percentile      Pulse Rate 92     Resp 16     Temp 99.3 F (37.4 C)     Temp Source Axillary     SpO2 100 %     Weight      Height      Head Circumference      Peak Flow      Pain Score      Pain Loc      Pain Education      Exclude from Growth Chart    Most recent vital signs: Vitals:   08/30/23 2230 08/30/23 2300  BP: (!) 145/73 138/75  Pulse: 79 83  Resp:  18  Temp:    SpO2: 98% 98%   General: Awake, GCS 9 CV:  Good peripheral perfusion.  Resp:  Normal effort.  Abd:  No distention.  Other:  Elderly well-developed, well-nourished African-American female resting in stretcher.  Right upper extremity held in contraction. ED Results / Procedures / Treatments  Labs (all labs ordered are listed, but only abnormal results are displayed) Labs Reviewed  COMPREHENSIVE METABOLIC PANEL - Abnormal; Notable for the following components:      Result Value   Glucose, Bld 249 (*)    BUN 28 (*)    Creatinine, Ser 1.57 (*)    Total Protein 8.6 (*)    Total Bilirubin 1.5 (*)    GFR, Estimated 37 (*)    All other components within normal limits  LACTIC ACID, PLASMA - Abnormal; Notable for the following  components:   Lactic Acid, Venous 2.0 (*)    All other components within normal limits  CBG MONITORING, ED - Abnormal; Notable for the following components:   Glucose-Capillary 243 (*)    All other components within normal limits  CBC  LACTIC ACID, PLASMA  URINALYSIS, W/ REFLEX TO CULTURE (INFECTION SUSPECTED)  HEMOGLOBIN A1C   EKG ED ECG REPORT I, Merwyn Katos, the attending physician, personally viewed and interpreted this ECG. Date: 08/30/2023 EKG Time: 1703 Rate: 93 Rhythm: normal sinus rhythm QRS Axis: normal Intervals: normal ST/T Wave abnormalities: normal Narrative Interpretation: no evidence of acute ischemia RADIOLOGY ED MD interpretation: Head CT without contrast independently interpreted and shows an acute early subacute left anterior ACA distribution infarct with no hemorrhage or mass effect -Agree with radiology assessment Official radiology report(s): CT Head Wo Contrast Result Date: 08/30/2023 CLINICAL DATA:  Altered mental status EXAM: CT HEAD WITHOUT CONTRAST TECHNIQUE: Contiguous axial images were obtained from the base of the skull through the vertex without intravenous contrast. RADIATION DOSE REDUCTION: This exam was performed according to  the departmental dose-optimization program which includes automated exposure control, adjustment of the mA and/or kV according to patient size and/or use of iterative reconstruction technique. COMPARISON:  03/10/2023 FINDINGS: Brain: There is an area of cytotoxic edema within the left anterior cerebral artery distribution, likely an acute/early subacute infarct. There is encephalomalacia in the posterior left hemisphere that is unchanged. There is also an old right parieto-occipital infarct. Generalized white matter changes of chronic small vessel ischemia. No acute hemorrhage. Vascular: There is atherosclerotic calcification of both internal carotid arteries at the skull base. Skull: Negative Sinuses/Orbits: No acute finding.  Other: None. IMPRESSION: 1. Acute/early subacute left anterior cerebral artery distribution infarct. No hemorrhage or mass effect. 2. Old left posterior cerebral artery and right parieto-occipital infarcts. Electronically Signed   By: Deatra Robinson M.D.   On: 08/30/2023 19:10   PROCEDURES: Critical Care performed: Yes, see critical care procedure note(s) .1-3 Lead EKG Interpretation  Performed by: Merwyn Katos, MD Authorized by: Merwyn Katos, MD     Interpretation: normal     ECG rate:  91   ECG rate assessment: normal     Rhythm: sinus rhythm     Ectopy: none     Conduction: normal   CRITICAL CARE Performed by: Merwyn Katos  Total critical care time: 35 minutes  Critical care time was exclusive of separately billable procedures and treating other patients.  Critical care was necessary to treat or prevent imminent or life-threatening deterioration.  Critical care was time spent personally by me on the following activities: development of treatment plan with patient and/or surrogate as well as nursing, discussions with consultants, evaluation of patient's response to treatment, examination of patient, obtaining history from patient or surrogate, ordering and performing treatments and interventions, ordering and review of laboratory studies, ordering and review of radiographic studies, pulse oximetry and re-evaluation of patient's condition.  MEDICATIONS ORDERED IN ED: Medications  insulin aspart (novoLOG) injection 0-15 Units (5 Units Subcutaneous Given 08/30/23 2236)  lactated ringers bolus 1,000 mL (0 mLs Intravenous Stopped 08/30/23 2100)  labetalol (NORMODYNE) injection 10 mg (10 mg Intravenous Given 08/30/23 2036)  acetaminophen (OFIRMEV) IV 1,000 mg (0 mg Intravenous Stopped 08/30/23 2224)   IMPRESSION / MDM / ASSESSMENT AND PLAN / ED COURSE  I reviewed the triage vital signs and the nursing notes.                             The patient is on the cardiac monitor to  evaluate for evidence of arrhythmia and/or significant heart rate changes. Patient's presentation is most consistent with acute presentation with potential threat to life or bodily function. Stroke alert PMH risk factors: Previous CVA, hyperlipidemia, hypertension Neurologic Deficits: Right side contraction, aphasic Last known Well Time: 4 days ago Given History and Exam I have lower suspicion for infectious etiology, neurologic changes secondary to toxicologic ingestion, seizure, complex migraine. Presentation concerning for possible stroke requiring workup.  Workup: Labs: POC glucose, CBC, BMP, LFTs, Troponin, PT/INR, PTT, Type and Screen Other Diagnostics: ECG, CXR, non-contrast head CT followed by CTA brain and neck (to r/o large vessel occlusion amenable to thrombectomy) Interventions: Patient low on NIH scale and out of the window for tpa.  Consult: Neurology.  Pending Disposition: Admission to medicine.   FINAL CLINICAL IMPRESSION(S) / ED DIAGNOSES   Final diagnoses:  Acute CVA (cerebrovascular accident) (HCC)  Altered mental status, unspecified altered mental status type  Aphasia   Rx /  DC Orders   ED Discharge Orders     None      Note:  This document was prepared using Dragon voice recognition software and may include unintentional dictation errors.   Merwyn Katos, MD 08/30/23 [phone]

## 2023-08-30 NOTE — ED Triage Notes (Signed)
Family called EMS for possible stroke; normally patient talks throughout the day but since Monday has not spoken much. Patient currently at baseline due to previous strokes.   175/98 16 98 ETCO2 32

## 2023-08-30 NOTE — H&P (Signed)
PCP:   Tobey Grim, MD   Chief Complaint:  Decreased level of consciousness.  HPI: This is an unfortunate 62 year old female with past medical history of recurrent CVAs, seizures, HLD, HTN, T2DM, expressive aphasia, liver cirrhosis, CKD stage IV.  Patient lives at home with her husband and daughter.  At baseline patient bedbound.  Per husband he left for work today around 6:30 AM, patient with good bite.  When his daughter came down HPI patient is a mostly unresponsive.  Per husband over the course the past 5 days the patient has had increasing levels of interaction.  She has not been talking much, she is increasing her teeth, she has not been eating much.  At baseline patient with left-sided deficit, aphasia, lower extremity rigidity per husband.  Her p.o. intake has been decreasing perhaps over months with patient mostly pocketing food.  Patient taken to the ER.  In the ER vital stable patient with a low-grade temperature 99.3.  Lactic acid 2.0.  Creatinine 1.57 baseline, glucose 249, CT head shows acute/early subacute left anterior cerebral artery distribution infarct.  No hemorrhage.  Teleneuro consult requested.  Per EDP response pending.  Patient with right-sided deficits  Review of Systems:  Unable to obtain due to patient's mentation  Past Medical History: Past Medical History:  Diagnosis Date   Diabetes mellitus without complication (HCC)    Hypertension    Stroke (HCC)    visual impaired    Past Surgical History:  Procedure Laterality Date   ABDOMINAL HYSTERECTOMY     TONSILLECTOMY      Medications: Prior to Admission medications   Medication Sig Start Date End Date Taking? Authorizing Provider  acetaminophen (TYLENOL) 325 MG tablet Take 2 tablets (650 mg total) by mouth every 4 (four) hours as needed for mild pain (or temp > 37.5 C (99.5 F)). 10/25/22   Osvaldo Shipper, MD  amLODipine (NORVASC) 10 MG tablet Take 10 mg by mouth daily. 10/03/22   [provider]   B Complex-C (B-COMPLEX WITH VITAMIN C) tablet Take 1 tablet by mouth daily. Patient not taking: Reported on 10/16/2022 02/17/22   Angiulli, Mcarthur Rossetti, PA-C  clopidogrel (PLAVIX) 75 MG tablet Take 1 tablet (75 mg total) by mouth daily. 01/25/23 01/25/24  Dorcas Carrow, MD  ezetimibe (ZETIA) 10 MG tablet Take 1 tablet (10 mg total) by mouth daily. 03/13/23 04/12/23  Charise Killian, MD  Insulin Aspart FlexPen (NOVOLOG) 100 UNIT/ML Inject 5 Units into the skin with breakfast, with lunch, and with evening meal. 01/25/23   Dorcas Carrow, MD  isosorbide mononitrate (ISMO) 10 MG tablet Take 1 tablet (10 mg total) by mouth 3 (three) times daily. 03/12/23 04/11/23  Charise Killian, MD  levETIRAcetam (KEPPRA) 250 MG tablet Take 250 mg by mouth 2 (two) times daily. 10/03/22   [provider]  metoprolol succinate (TOPROL-XL) 25 MG 24 hr tablet Take 1 tablet (25 mg total) by mouth daily. 01/25/23 02/25/23  Dorcas Carrow, MD  polyethylene glycol (MIRALAX / GLYCOLAX) 17 g packet Take 17 g by mouth daily. 11/30/22   [provider]  rosuvastatin (CRESTOR) 40 MG tablet Take 0.5 tablets (20 mg total) by mouth at bedtime. 01/25/23 02/25/23  Dorcas Carrow, MD  senna-docusate (SENOKOT-S) 8.6-50 MG tablet Take 2 tablets by mouth at bedtime. 11/30/22 11/30/23  [provider]  TRESIBA FLEXTOUCH 100 UNIT/ML FlexTouch Pen Inject 10 Units into the skin at bedtime. 01/25/23   Dorcas Carrow, MD  Vitamin D, Ergocalciferol, (DRISDOL) 1.25 MG (  50000 UNIT) CAPS capsule Take 50,000 Units by mouth every 7 (seven) days. 02/02/23   [provider]    Allergies:   Allergies  Allergen Reactions   Metformin Other (See Comments)    Reaction: unknown   Penicillins Hives, Nausea And Vomiting and Swelling    Has patient had a PCN reaction causing immediate rash, facial/tongue/throat swelling, SOB or lightheadedness with hypotension: Yes Has patient had a PCN reaction causing severe rash involving mucus  membranes or skin necrosis: No Has patient had a PCN reaction that required hospitalization Yes Has patient had a PCN reaction occurring within the last 10 years: No If all of the above answers are "NO", then may proceed with Cephalosporin use.    Codeine Other (See Comments)    Reaction: HIVES   Hydralazine     Stomach pain   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    Social History:  reports that she has never smoked. She has never been exposed to tobacco smoke. She has never used smokeless tobacco. She reports that she does not drink alcohol and does not use drugs.  Family History: Family History  Problem Relation Age of Onset   Diabetes Father     Physical Exam: Vitals:   08/30/23 2045 08/30/23 2100 08/30/23 2130 08/30/23 2200  BP:  (!) 204/124 (!) 152/109 (!) 145/73  Pulse: 94 98 (!) 107 86  Resp:      Temp:      TempSrc:      SpO2: 99% 99% 100% 99%    General: Awake, somnolent, poorly interactive, in no distress.  Does not follow directions.  Nonfocal exam Eyes: Pink conjunctiva, no scleral icterus ENT: Moist oral mucosa, neck supple, no thyromegaly Lungs: CTA B/L, no wheeze, no crackles, no use of accessory muscles Cardiovascular: RRR, no regurgitation, no gallops, no murmurs. No carotid bruits, no JVD Abdomen: soft, positive BS, NTND, no organomegaly, not an acute abdomen GU: not examined Neuro: Unable to accurately assess d/t patient's baseline mentation plus current Musculoskeletal: Unable to accurately assess due to patient's baseline mentation Skin: no rash, no subcutaneous crepitation, no decubitus Psych: Arousable patient   Labs on Admission:  Recent Labs    08/30/23 1730  NA 140  K 3.8  CL 102  CO2 25  GLUCOSE 249*  BUN 28*  CREATININE 1.57*  CALCIUM 9.8   Recent Labs    08/30/23 1730  AST 22  ALT 10  ALKPHOS 88  BILITOT 1.5*  PROT 8.6*  ALBUMIN 3.8    Recent Labs    08/30/23 1730  WBC 7.0  HGB 14.0  HCT 41.7   MCV 86.3  PLT 293    Radiological Exams on Admission: CT Head Wo Contrast Result Date: 08/30/2023 CLINICAL DATA:  Altered mental status EXAM: CT HEAD WITHOUT CONTRAST TECHNIQUE: Contiguous axial images were obtained from the base of the skull through the vertex without intravenous contrast. RADIATION DOSE REDUCTION: This exam was performed according to the departmental dose-optimization program which includes automated exposure control, adjustment of the mA and/or kV according to patient size and/or use of iterative reconstruction technique. COMPARISON:  03/10/2023 FINDINGS: Brain: There is an area of cytotoxic edema within the left anterior cerebral artery distribution, likely an acute/early subacute infarct. There is encephalomalacia in the posterior left hemisphere that is unchanged. There is also an old right parieto-occipital infarct. Generalized white matter changes of chronic small vessel ischemia. No acute hemorrhage. Vascular: There is atherosclerotic calcification of  both internal carotid arteries at the skull base. Skull: Negative Sinuses/Orbits: No acute finding. Other: None. IMPRESSION: 1. Acute/early subacute left anterior cerebral artery distribution infarct. No hemorrhage or mass effect. 2. Old left posterior cerebral artery and right parieto-occipital infarcts. Electronically Signed   By: Deatra Robinson M.D.   On: 08/30/2023 19:10    Assessment/Plan Present on Admission:  Acute CVA //   Right sided hemiplegia //  Expressive aphasia -CVA order set initiated -Rectal aspirin -Neurocheck every 2 hours -Permissive hypertension ordered -MRI brain, 2D echo ordered, CTA head and neck -Neurology consult pending -IV fluid hydration   Seizures -Keppra 500 mg IV every 12 ordered -EEG ordered -Seizure precautions   Type II diabetes mellitus with renal manifestations (HCC) -N.p.o., sliding scale insulin every 4 hours.   Dyslipidemia -N.p.o. no medications currently   Essential  hypertension -Permissive hypertension   Stage 3b chronic kidney disease (CKD) (HCC) -Stable at baseline.  Avoid nephrotoxic medication   Liver cirrhosis (HCC) -Ammonia level ordered  General: Patient is a hospice patient.  Patient likely discontinued from hospice secondary to being brought to the hospital.  Would likely need hospice reconsult  Yides Saidi 08/30/2023, 10:29 PM

## 2023-08-30 NOTE — Hospital Course (Signed)
type 2 diabetes mellitus, with stage IIIb chronic kidney disease, seizure disorder, hypertension and CVA

## 2023-08-30 NOTE — ED Notes (Signed)
No urine noted in urine collection bag--husband remains at bedside

## 2023-08-31 ENCOUNTER — Inpatient Hospital Stay

## 2023-08-31 ENCOUNTER — Ambulatory Visit: Payer: Medicare HMO

## 2023-08-31 ENCOUNTER — Inpatient Hospital Stay (HOSPITAL_COMMUNITY)
Admit: 2023-08-31 | Discharge: 2023-08-31 | Disposition: A | Discharge status: Home / Self Care | Attending: Family Medicine | Admitting: Family Medicine

## 2023-08-31 DIAGNOSIS — I639 Cerebral infarction, unspecified: Secondary | ICD-10-CM | POA: Diagnosis not present

## 2023-08-31 DIAGNOSIS — R4701 Aphasia: Secondary | ICD-10-CM | POA: Diagnosis not present

## 2023-08-31 DIAGNOSIS — Z515 Encounter for palliative care: Secondary | ICD-10-CM

## 2023-08-31 DIAGNOSIS — I10 Essential (primary) hypertension: Secondary | ICD-10-CM

## 2023-08-31 DIAGNOSIS — R4182 Altered mental status, unspecified: Secondary | ICD-10-CM | POA: Diagnosis not present

## 2023-08-31 DIAGNOSIS — E1122 Type 2 diabetes mellitus with diabetic chronic kidney disease: Secondary | ICD-10-CM | POA: Diagnosis not present

## 2023-08-31 DIAGNOSIS — I6389 Other cerebral infarction: Secondary | ICD-10-CM | POA: Diagnosis not present

## 2023-08-31 DIAGNOSIS — N183 Chronic kidney disease, stage 3 unspecified: Secondary | ICD-10-CM

## 2023-08-31 DIAGNOSIS — G40909 Epilepsy, unspecified, not intractable, without status epilepticus: Secondary | ICD-10-CM

## 2023-08-31 DIAGNOSIS — N1832 Chronic kidney disease, stage 3b: Secondary | ICD-10-CM

## 2023-08-31 DIAGNOSIS — Z794 Long term (current) use of insulin: Secondary | ICD-10-CM

## 2023-08-31 LAB — ECHOCARDIOGRAM COMPLETE
AR max vel: 2.46 cm2
AV Area VTI: 2.17 cm2
AV Area mean vel: 2.2 cm2
AV Mean grad: 2 mm[Hg]
AV Peak grad: 4.5 mm[Hg]
Ao pk vel: 1.06 m/s
Area-P 1/2: 5.66 cm2
MV VTI: 2.93 cm2
S' Lateral: 1.9 cm

## 2023-08-31 LAB — COMPREHENSIVE METABOLIC PANEL
ALT: 11 U/L (ref 0–44)
AST: 27 U/L (ref 15–41)
Albumin: 3.3 g/dL — ABNORMAL LOW (ref 3.5–5.0)
Alkaline Phosphatase: 72 U/L (ref 38–126)
Anion gap: 11 (ref 5–15)
BUN: 25 mg/dL — ABNORMAL HIGH (ref 8–23)
CO2: 23 mmol/L (ref 22–32)
Calcium: 9.1 mg/dL (ref 8.9–10.3)
Chloride: 104 mmol/L (ref 98–111)
Creatinine, Ser: 1.68 mg/dL — ABNORMAL HIGH (ref 0.44–1.00)
GFR, Estimated: 34 mL/min — ABNORMAL LOW (ref >60)
Glucose, Bld: 201 mg/dL — ABNORMAL HIGH (ref 70–99)
Potassium: 3.8 mmol/L (ref 3.5–5.1)
Sodium: 138 mmol/L (ref 135–145)
Total Bilirubin: 1.5 mg/dL — ABNORMAL HIGH (ref <1.2)
Total Protein: 7.5 g/dL (ref 6.5–8.1)

## 2023-08-31 LAB — CBG MONITORING, ED
Glucose-Capillary: 109 mg/dL — ABNORMAL HIGH (ref 70–99)
Glucose-Capillary: 181 mg/dL — ABNORMAL HIGH (ref 70–99)
Glucose-Capillary: 72 mg/dL (ref 70–99)
Glucose-Capillary: 87 mg/dL (ref 70–99)

## 2023-08-31 LAB — CBC
HCT: 36 % (ref 36.0–46.0)
Hemoglobin: 12.4 g/dL (ref 12.0–15.0)
MCH: 29.1 pg (ref 26.0–34.0)
MCHC: 34.4 g/dL (ref 30.0–36.0)
MCV: 84.5 fL (ref 80.0–100.0)
Platelets: 257 10*3/uL (ref 150–400)
RBC: 4.26 MIL/uL (ref 3.87–5.11)
RDW: 14.3 % (ref 11.5–15.5)
WBC: 7.5 10*3/uL (ref 4.0–10.5)
nRBC: 0 % (ref 0.0–0.2)

## 2023-08-31 LAB — LIPID PANEL
Cholesterol: 336 mg/dL — ABNORMAL HIGH (ref 0–200)
HDL: 69 mg/dL (ref >40)
LDL Cholesterol: 247 mg/dL — ABNORMAL HIGH (ref 0–99)
Total CHOL/HDL Ratio: 4.9 {ratio}
Triglycerides: 98 mg/dL (ref <150)
VLDL: 20 mg/dL (ref 0–40)

## 2023-08-31 LAB — AMMONIA: Ammonia: 18 umol/L (ref 9–35)

## 2023-08-31 LAB — PROTIME-INR
INR: 1.1 (ref 0.8–1.2)
Prothrombin Time: 14.4 s (ref 11.4–15.2)

## 2023-08-31 LAB — HEMOGLOBIN A1C
Hgb A1c MFr Bld: 8.3 % — ABNORMAL HIGH (ref 4.8–5.6)
Mean Plasma Glucose: 191.51 mg/dL

## 2023-08-31 MED ORDER — ASPIRIN 300 MG RE SUPP: 300.0000 mg | Freq: Every day | RECTAL | Status: DC

## 2023-08-31 MED ORDER — LORAZEPAM 2 MG/ML IJ SOLN
1.0000 mg | Freq: Once | INTRAMUSCULAR | Status: AC | PRN
  Administered 2023-09-02: 1 mg via INTRAVENOUS
  Filled 2023-08-31: qty 1

## 2023-08-31 MED ORDER — METOPROLOL TARTRATE 5 MG/5ML IV SOLN: 5.0000 mg | INTRAVENOUS | Status: DC | PRN

## 2023-08-31 MED ORDER — LEVETIRACETAM IN NACL 500 MG/100ML IV SOLN: 500.0000 mg | Freq: Two times a day (BID) | INTRAVENOUS | Status: DC

## 2023-08-31 MED ORDER — DEXTROSE IN LACTATED RINGERS 5 % IV SOLN: INTRAVENOUS | Status: AC

## 2023-08-31 MED ORDER — SODIUM CHLORIDE 0.9 % IV SOLN: 250.0000 mg | Freq: Two times a day (BID) | INTRAVENOUS | Status: DC

## 2023-08-31 MED ORDER — LEVETIRACETAM IN NACL 500 MG/100ML IV SOLN
500.0000 mg | Freq: Two times a day (BID) | INTRAVENOUS | Status: DC
  Administered 2023-08-31 – 2023-09-02 (×5): 500 mg via INTRAVENOUS
  Filled 2023-08-31 (×9): qty 100

## 2023-08-31 MED ORDER — IOHEXOL 350 MG/ML SOLN
60.0000 mL | Freq: Once | INTRAVENOUS | Status: AC | PRN
  Administered 2023-08-31: 60 mL via INTRAVENOUS

## 2023-08-31 NOTE — Progress Notes (Signed)
Progress Note   Patient: [name] Gilbert NWG:[id number] DOB: [birth year] DOA: 08/30/2023     1 DOS: the patient was seen and examined on 08/31/2023   Brief hospital course: [name] Gilbert is a 62 year old female with past medical history of recurrent CVAs, seizures, HLD, HTN, T2DM, expressive aphasia, liver cirrhosis, CKD stage IV, bed bound, at baseline patient with left-sided deficit, aphasia, lower extremity rigidity presented to ED for poor interaction than usual, decreased p.o. intake, worsening right upper extremity contracture.   CT head shows acute/early subacute left anterior cerebral artery distribution infarct. Admitted to hospitalist service for further management and evaluation of stroke. Patient is previously followed by hospice services at home.  Assessment and Plan: Acute CVA Right sided hemiplegia, aphasia. Discussed with patient's husband and daughter at bedside who agree that her prognosis is poor. Overall quality of life is poor agreed to talk to palliative to initiate hospice care services. Neurology team did talk to family advised hospice care.  Seizure disorder: Continue Keppra 500mg  q12hr. Seizure precautions.  Hypertension Hyperlipidemia Liver cirrhosis Type 2 Diabetes  Will continue only supportive care. Will hold off accucheks, labs and frequent vitals.  Hospice liaison to evaluate her for inpatient services.     Nursing supportive care. Fall, aspiration precautions. DVT prophylaxis   Code Status: Do not attempt resuscitation (DNR) - Comfort care Subjective: Patient is seen and examined today morning. She is lethargic, does not follow commands. I discussed with patient's husband at bedside and later in the afternoon with her daughter.   Physical Exam: Vitals:   08/31/23 1030 08/31/23 1130 08/31/23 1150 08/31/23 1558  BP: 107/71 133/75    Pulse: 72 72    Resp: 13 12    Temp:   98.7 F (37.1 C) 98.9 F (37.2 C)  TempSrc:   Axillary Axillary   SpO2: 100% 100%      General - Elderly African American female, obtunded HEENT - PERRLA, EOMI, atraumatic head, non tender sinuses. Lung - Clear, diffuse rales, rhonchi, no wheezes. Heart - S1, S2 heard, no murmurs, rubs, trace pedal edema. Abdomen - lethargic, unable to do full neuro exam. Neuro - lethargic, does not follow commands, unable to do full neuro exam. Skin - Warm and dry.  Data Reviewed:      Latest Ref Rng & Units 08/31/2023    8:49 AM 08/30/2023    5:30 PM 03/14/2023    3:09 PM  CBC  WBC 4.0 - 10.5 K/uL 7.5  7.0  7.6   Hemoglobin 12.0 - 15.0 g/dL 57.8  46.9  62.9   Hematocrit 36.0 - 46.0 % 36.0  41.7  35.9   Platelets 150 - 400 K/uL 257  293  327       Latest Ref Rng & Units 08/31/2023    8:49 AM 08/30/2023    5:30 PM 03/14/2023    3:09 PM  BMP  Glucose 70 - 99 mg/dL 528  413  244   BUN 8 - 23 mg/dL 25  28  36   Creatinine 0.44 - 1.00 mg/dL 0.10  2.72  5.36   Sodium 135 - 145 mmol/L 138  140  138   Potassium 3.5 - 5.1 mmol/L 3.8  3.8  4.4   Chloride 98 - 111 mmol/L 104  102  103   CO2 22 - 32 mmol/L 23  25  23    Calcium 8.9 - 10.3 mg/dL 9.1  9.8  9.2    EEG adult Result Date: 08/31/2023 [name] Quest,  MD     08/31/2023  2:24 PM Patient Name: [name] Gilbert MRN: [id number] Epilepsy Attending: Charlsie Gilbert Referring Physician/Provider: Gery Pray, MD Date: 08/31/2023 Duration: Patient history: 62yo F with ams getting eeg to evaluate for seizure Level of alertness: Awake AEDs during EEG study: LEV Technical aspects: This EEG study was done with scalp electrodes positioned according to the 10-20 International system of electrode placement. Electrical activity was reviewed with band pass filter of 1-70Hz , sensitivity of 7 uV/mm, display speed of 43mm/sec with a 60Hz  notched filter applied as appropriate. EEG data were recorded continuously and digitally stored.  Video monitoring was available and reviewed as appropriate. Description: EEG showed  continuous generalized 3 to 6 Hz theta-delta slowing. Generalized periodic discharges with triphasic morphology at 1-1.5 Hz were also noted. Hyperventilation and photic stimulation were not performed.   ABNORMALITY - Periodic discharges with triphasic morphology, generalized ( GPDs) - Continuous slow, generalized IMPRESSION: This study is suggestive of moderate diffuse encephalopathy, likely related to toxic-metabolic causes. No seizures or definite epileptiform discharges were seen throughout the recording. [name] Gilbert   ECHOCARDIOGRAM COMPLETE Result Date: 08/31/2023    ECHOCARDIOGRAM REPORT   Patient Name:   [name] Gilbert Date of Exam: 08/31/2023 Medical Rec #:  [id number]        Height:       62.0 in Accession #:    [id number]       Weight:       138.9 lb Date of Birth:  21-Jul-1961        BSA:          1.637 m Patient Age:    62 years         BP:           111/64 mmHg Patient Gender: F                HR:           96 bpm. Exam Location:  [facility] Procedure: 2D Echo, Cardiac Doppler and Color Doppler Indications:     TIA  History:         Patient has prior history of Echocardiogram examinations, most                  recent 10/15/2022. Stroke and TIA; Risk Factors:Hypertension,                  Diabetes, Dyslipidemia and Non-Smoker. CKD, TDS-Patient's right                  arm is contracted over her chest.  Sonographer:     Mikki Harbor Referring Phys:  [id number] DEBBY CROSLEY Diagnosing Phys: Debbe Odea MD  Sonographer Comments: Technically challenging study due to limited acoustic windows, suboptimal parasternal window and suboptimal apical window. IMPRESSIONS  1. Left ventricular ejection fraction, by estimation, is 60 to 65%. The left ventricle has normal function. The left ventricle has no regional wall motion abnormalities. Left ventricular diastolic parameters are consistent with Grade I diastolic dysfunction (impaired relaxation).  2. Right ventricular systolic function is normal. The right  ventricular size is normal. There is normal pulmonary artery systolic pressure.  3. The mitral valve is normal in structure. No evidence of mitral valve regurgitation.  4. The aortic valve was not well visualized. Aortic valve regurgitation is not visualized.  5. The inferior vena cava is normal in size with greater than 50% respiratory variability, suggesting right atrial pressure of 3 mmHg. FINDINGS  Left Ventricle:  Left ventricular ejection fraction, by estimation, is 60 to 65%. The left ventricle has normal function. The left ventricle has no regional wall motion abnormalities. The left ventricular internal cavity size was normal in size. There is  no left ventricular hypertrophy. Left ventricular diastolic parameters are consistent with Grade I diastolic dysfunction (impaired relaxation). Right Ventricle: The right ventricular size is normal. No increase in right ventricular wall thickness. Right ventricular systolic function is normal. There is normal pulmonary artery systolic pressure. The tricuspid regurgitant velocity is 1.44 m/s, and  with an assumed right atrial pressure of 3 mmHg, the estimated right ventricular systolic pressure is 11.3 mmHg. Left Atrium: Left atrial size was normal in size. Right Atrium: Right atrial size was normal in size. Pericardium: There is no evidence of pericardial effusion. Mitral Valve: The mitral valve is normal in structure. No evidence of mitral valve regurgitation. MV peak gradient, 4.3 mmHg. The mean mitral valve gradient is 2.0 mmHg. Tricuspid Valve: The tricuspid valve is normal in structure. Tricuspid valve regurgitation is not demonstrated. Aortic Valve: The aortic valve was not well visualized. Aortic valve regurgitation is not visualized. Aortic valve mean gradient measures 2.0 mmHg. Aortic valve peak gradient measures 4.5 mmHg. Aortic valve area, by VTI measures 2.17 cm. Pulmonic Valve: The pulmonic valve was not well visualized. Pulmonic valve regurgitation is  not visualized. Aorta: The aortic root is normal in size and structure. Venous: The inferior vena cava is normal in size with greater than 50% respiratory variability, suggesting right atrial pressure of 3 mmHg. IAS/Shunts: No atrial level shunt detected by color flow Doppler.  LEFT VENTRICLE PLAX 2D LVIDd:         3.30 cm   Diastology LVIDs:         1.90 cm   LV e' medial:    5.33 cm/s LV PW:         0.80 cm   LV E/e' medial:  8.0 LV IVS:        0.90 cm   LV e' lateral:   5.00 cm/s LVOT diam:     1.90 cm   LV E/e' lateral: 8.5 LV SV:         45 LV SV Index:   27 LVOT Area:     2.84 cm  RIGHT VENTRICLE RV Basal diam:  2.50 cm RV Mid diam:    2.40 cm RV S prime:     15.30 cm/s LEFT ATRIUM             Index        RIGHT ATRIUM          Index LA diam:        2.30 cm 1.40 cm/m   RA Area:     7.67 cm LA Vol (A2C):   19.4 ml 11.85 ml/m  RA Volume:   14.80 ml 9.04 ml/m LA Vol (A4C):   18.5 ml 11.30 ml/m LA Biplane Vol: 20.2 ml 12.34 ml/m  AORTIC VALVE                    PULMONIC VALVE AV Area (Vmax):    2.46 cm     PV Vmax:       1.04 m/s AV Area (Vmean):   2.20 cm     PV Peak grad:  4.3 mmHg AV Area (VTI):     2.17 cm AV Vmax:           106.00 cm/s AV Vmean:  71.500 cm/s AV VTI:            0.205 m AV Peak Grad:      4.5 mmHg AV Mean Grad:      2.0 mmHg LVOT Vmax:         91.80 cm/s LVOT Vmean:        55.400 cm/s LVOT VTI:          0.157 m LVOT/AV VTI ratio: 0.77  AORTA Ao Root diam: 2.70 cm MITRAL VALVE               TRICUSPID VALVE MV Area (PHT): 5.66 cm    TR Peak grad:   8.3 mmHg MV Area VTI:   2.93 cm    TR Vmax:        144.00 cm/s MV Peak grad:  4.3 mmHg MV Mean grad:  2.0 mmHg    SHUNTS MV Vmax:       1.04 m/s    Systemic VTI:  0.16 m MV Vmean:      55.7 cm/s   Systemic Diam: 1.90 cm MV Decel Time: 134 msec MV E velocity: 42.40 cm/s MV A velocity: 96.90 cm/s MV E/A ratio:  0.44 Debbe Odea MD Electronically signed by Debbe Odea MD Signature Date/Time: 08/31/2023/1:13:28 PM    Final     CT ANGIO HEAD NECK W WO CM Result Date: 08/31/2023 CLINICAL DATA:  Right upper extremity weakness EXAM: CT ANGIOGRAPHY HEAD AND NECK WITH AND WITHOUT CONTRAST TECHNIQUE: Multidetector CT imaging of the head and neck was performed using the standard protocol during bolus administration of intravenous contrast. Multiplanar CT image reconstructions and MIPs were obtained to evaluate the vascular anatomy. Carotid stenosis measurements (when applicable) are obtained utilizing NASCET criteria, using the distal internal carotid diameter as the denominator. RADIATION DOSE REDUCTION: This exam was performed according to the departmental dose-optimization program which includes automated exposure control, adjustment of the mA and/or kV according to patient size and/or use of iterative reconstruction technique. CONTRAST:  60mL OMNIPAQUE IOHEXOL 350 MG/ML SOLN COMPARISON:  Head CT 08/30/2023, brain MRI 08/31/2023, CTA head neck 02/25/2023 FINDINGS: CTA NECK FINDINGS Skeleton: No acute abnormality or high grade bony spinal canal stenosis. Other neck: Normal pharynx, larynx and major salivary glands. No cervical lymphadenopathy. Unremarkable thyroid gland. Upper chest: No pneumothorax or pleural effusion. No nodules or masses. Aortic arch: There is no calcific atherosclerosis of the aortic arch. Conventional 3 vessel aortic branching pattern. RIGHT carotid system: Normal without aneurysm, dissection or stenosis. LEFT carotid system: No dissection, occlusion or aneurysm. Mild atherosclerotic calcification at the carotid bifurcation without hemodynamically significant stenosis. Generally diminutive left ICA, unchanged. Vertebral arteries: Left dominant configuration. There is no dissection, occlusion or flow-limiting stenosis to the skull base (V1-V3 segments). CTA HEAD FINDINGS POSTERIOR CIRCULATION: Vertebral arteries are normal. No proximal occlusion of the anterior or inferior cerebellar arteries. Basilar artery is  normal. Superior cerebellar arteries are normal. Chronic occlusion of the left PCA. ANTERIOR CIRCULATION: Atherosclerotic calcification of the internal carotid arteries at the skull base without hemodynamically significant stenosis. There is proximal occlusion of the left anterior cerebral artery A2 segment. The right ACA is normal. Bilateral atherosclerotic irregularity without high-grade stenosis. Venous sinuses: As permitted by contrast timing, patent. Anatomic variants: None Review of the MIP images confirms the above findings. IMPRESSION: 1. Proximal occlusion of the left anterior cerebral artery A2 segment. 2. Chronic occlusion of the left PCA. 3. No hemodynamically significant stenosis of the neck. These results will be called to  the ordering clinician or representative by the Radiologist Assistant, and communication documented in the PACS or [employer]. Electronically Signed   By: Deatra Robinson M.D.   On: 08/31/2023 02:06   MR BRAIN WO CONTRAST Result Date: 08/31/2023 CLINICAL DATA:  Transient ischemic attack EXAM: MRI HEAD WITHOUT CONTRAST TECHNIQUE: Multiplanar, multiecho pulse sequences of the brain and surrounding structures were obtained without intravenous contrast. COMPARISON:  None Available. FINDINGS: Brain: Abnormal diffusion restriction throughout the left anterior cerebral artery territory. Minimal diffusion abnormality within the right hemispheric white matter. Chronic microhemorrhages in a predominantly central distribution, including the pons. Chronic petechial hemorrhage over the posterior left hemisphere. There is confluent hyperintense T2-weighted signal within the white matter. Generalized volume loss. 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 Old right parietal infarct. The midline structures are normal. Vascular: Normal flow voids. Skull and upper cervical spine: Normal calvarium and skull base. Visualized upper cervical spine and soft tissues are normal. Sinuses/Orbits:No paranasal  sinus fluid levels or advanced mucosal thickening. No mastoid or middle ear effusion. Normal orbits. IMPRESSION: 1. Acute left anterior cerebral artery territory infarct. No hemorrhage or mass effect. 2. Minimal diffusion abnormality within the right hemispheric white matter, which may indicate a small focus of acute/subacute ischemia. 3. Old left PCA territory infarct and old right parietal infarct. Electronically Signed   By: Deatra Robinson M.D.   On: 08/31/2023 01:37   CT Head Wo Contrast Result Date: 08/30/2023 CLINICAL DATA:  Altered mental status EXAM: CT HEAD WITHOUT CONTRAST TECHNIQUE: Contiguous axial images were obtained from the base of the skull through the vertex without intravenous contrast. RADIATION DOSE REDUCTION: This exam was performed according to the departmental dose-optimization program which includes automated exposure control, adjustment of the mA and/or kV according to patient size and/or use of iterative reconstruction technique. COMPARISON:  03/10/2023 FINDINGS: Brain: There is an area of cytotoxic edema within the left anterior cerebral artery distribution, likely an acute/early subacute infarct. There is encephalomalacia in the posterior left hemisphere that is unchanged. There is also an old right parieto-occipital infarct. Generalized white matter changes of chronic small vessel ischemia. No acute hemorrhage. Vascular: There is atherosclerotic calcification of both internal carotid arteries at the skull base. Skull: Negative Sinuses/Orbits: No acute finding. Other: None. IMPRESSION: 1. Acute/early subacute left anterior cerebral artery distribution infarct. No hemorrhage or mass effect. 2. Old left posterior cerebral artery and right parieto-occipital infarcts. Electronically Signed   By: Deatra Robinson M.D.   On: 08/30/2023 19:10     Family Communication: Discussed with family at bedside, they understand and agree. All questions answereed.  Disposition: Status is:  Inpatient Remains inpatient appropriate because: acute stroke work up  Planned Discharge Destination:  Unknown at this time, palliative discussion with family.     Time spent: 42 minutes  Author: Marcelino Duster, MD 08/31/2023 4:02 PM Secure chat 7am to 7pm For on call review [email or web address].

## 2023-08-31 NOTE — ED Notes (Signed)
Patient transported to CT 

## 2023-08-31 NOTE — ED Notes (Signed)
Lab contacted to send phlebotomist

## 2023-08-31 NOTE — Evaluation (Addendum)
Clinical/Bedside Swallow Evaluation Patient Details  Name: [name] Gilbert MRN: [id number] Date of Birth: 12-23-1960  Today's Date: 08/31/2023 Time: SLP Start Time (ACUTE ONLY): 1040 SLP Stop Time (ACUTE ONLY): 1140 SLP Time Calculation (min) (ACUTE ONLY): 60 min  Past Medical History:  Past Medical History:  Diagnosis Date   Diabetes mellitus without complication (HCC)    Hypertension    Stroke (HCC)    visual impaired    Past Surgical History:  Past Surgical History:  Procedure Laterality Date   ABDOMINAL HYSTERECTOMY     TONSILLECTOMY     HPI:  Pt is a 62 y/o female presented to ED on 08/30/23 for possible stroke with less responsiveness and difficulty speaking. MRI showed acute left anterior cerebral artery territory infarct and possible small focus of acute/subacute ischemia. PMH: multiple, recurrent CVAs, seizures, HLD, HTN, T2DM, expressive aphasia, liver cirrhosis, CKD stage IV.  Pt lives at home w/ family/husband and has had ongoing HOSPICE services in the home.  Husband reported decreased oral intake w/ oral pocketing of foods in recent days/weeks. Pt is Bedbound at home and requires assistance w/ all ADLs.     Assessment / Plan / Recommendation  Clinical Impression   Pt seen for BSE today. Pt lying in bed; arm contracted to under chin w/ washcloth roll in hand. Pt was nonverbal and did not indicate awareness of this SLP, her name. Eyes open. Husband present.  On RA, afebrile. WBC WNL.   Pt appears to present w/ (suspected) Profound oropharyngeal phase Dysphagia in setting of Profound Cognitive decline and decreased awareness/engagement in oral/po tasks today. Pt's Husband described ongoing s/s of Dysphagia in the home c/b pocketing of foods in her cheeks ("like a chipmunk") and decreased oral intake. Pt requires 100% Supervision and Support in the home. She has been receiving Hospice services in the home.  Pt's presentation of Dysphagia and declined Cognitive status  significantly increases risk for aspiration/aspiration pneumonia. An oral diet is NOT recommended at this time.   During po attempts of nectar liquids via tsp and applesauce, po trial attempts were placed anteriorly in mouth; placed on lips each time. Pt did NOT indicate awareness of the bolus material in/at mouth -- much delayed, involuntary lingual licking/smacking responses noted. This was followed by bolus leakage anteriorly from mouth. A pharyngeal swallow was appreciated x1 w/ these bolus attempts -- much delayed.  No overt unilateral OM weakness noted in labial symmetry/tone; midline lingual extension intermittently occurred. Unable to assess fully d/t mouth closed; tonic biting responses++. Husband reported same at home. Some anterior Dentition+.   Pt appears at HIGH risk for aspiration/aspiration pneumonia in setting of declined Cognitive status and engagement in po tasks/intake. An oral diet is NOT recommended at this time. Recommend oral care as able to when pt can participate safely. ST services can be available for further assessment if pt demonstrates improvements in status to safely participate in oral intake. Pt has known Cognitive-communication decline at Baseline -- see chart notes of prior evaluations; current status in the home. Any such assessment can be performed in the home.  Recommend f/u w/ Palliative Care and Hospice services to address GOC moving forward. MD/NSG updated, agreed.  SLP Visit Diagnosis: Dysphagia, oropharyngeal phase (R13.12) (Cognitive decline baseline)    Aspiration Risk  Severe aspiration risk;Risk for inadequate nutrition/hydration    Diet Recommendation   NPO (oral care if able to)  Medication Administration: Via alternative means    Other  Recommendations Recommended Consults:  (Palliative Care/Hospice) Oral  Care Recommendations: Oral care QID;Staff/trained caregiver to provide oral care (if able to)    Recommendations for follow up therapy are one  component of a multi-disciplinary discharge planning process, led by the attending physician.  Recommendations may be updated based on patient status, additional functional criteria and insurance authorization.  Follow up Recommendations Follow physician's recommendations for discharge plan and follow up therapies      Assistance Recommended at Discharge  FULL   Functional Status Assessment Patient has had a recent decline in their functional status and/or demonstrates limited ability to make significant improvements in function in a reasonable and predictable amount of time  Frequency and Duration  (TBD)   (TBD)       Prognosis Prognosis for improved oropharyngeal function: Guarded Barriers to Reach Goals: Cognitive deficits;Language deficits;Time post onset;Severity of deficits Barriers/Prognosis Comment: baseline Dementia; CVAs      Swallow Study   General Date of Onset: 08/30/23 HPI: Pt is a 62 y/o female presented to ED on 08/30/23 for possible stroke with less responsiveness and difficulty speaking. MRI showed acute left anterior cerebral artery territory infarct and possible small focus of acute/subacute ischemia. PMH: multiple, recurrent CVAs, seizures, HLD, HTN, T2DM, expressive aphasia, liver cirrhosis, CKD stage IV.  Pt lives at home w/ family/husband and has had ongoing HOSPICE services in the home.  Husband reported decreased oral intake w/ oral pocketing of foods in recent days/weeks. Pt is Bedbound at home and requires assistance w/ all ADLs. Type of Study: Bedside Swallow Evaluation Previous Swallow Assessment: 10/2022; language evals completed since that time w/ decline noted Diet Prior to this Study: NPO Temperature Spikes Noted: No (wbc 7.5) Respiratory Status: Room air History of Recent Intubation: No Behavior/Cognition: Alert;Confused;Distractible;Doesn't follow directions (Eyes opened; no indication of awareness of task) Oral Cavity Assessment:  (unable to assess d/t  mouth closed; tonic biting response+) Oral Care Completed by SLP:  (only able to brush front teeth exposed) Oral Cavity - Dentition:  (front/anterior Dentition noted) Vision:  (n/a) Self-Feeding Abilities: Total assist Patient Positioning: Upright in bed (full positioning given) Baseline Vocal Quality:  (nonverbal) Volitional Cough: Cognitively unable to elicit Volitional Swallow: Unable to elicit    Oral/Motor/Sensory Function Overall Oral Motor/Sensory Function:  (unable to assess - no overt/outward unilateral labial weakness noted)   Ice Chips Ice chips: Not tested   Thin Liquid Thin Liquid: Not tested    Nectar Thick Nectar Thick Liquid: Impaired Presentation: Spoon (2; placed anteriorly in mouth; placed on lips) Oral Phase Impairments: Reduced labial seal;Reduced lingual movement/coordination;Poor awareness of bolus Oral phase functional implications:  (leakage; involuntary lip smacking - delayed) Pharyngeal Phase Impairments:  (no pharyngeal swallow appreciated)   Honey Thick Honey Thick Liquid: Not tested   Puree Puree: Impaired Presentation: Spoon (fed; 2 trials -- similar as w/ nectar liquid) Pharyngeal Phase Impairments: Suspected delayed Swallow (1 pharyngeal swallow appreciated - delayed)   Solid     Solid: Not tested        Jerilynn Som, MS, CCC-SLP Speech Language Pathologist Rehab Services; [facility] [place] [phone] (ascom) Sugey Trevathan 08/31/2023,1:49 PM

## 2023-08-31 NOTE — Evaluation (Signed)
Physical Therapy Evaluation & Discharge  Patient Details Name: [name] Gilbert MRN: [id number] DOB: [birth year] Today's Date: 08/31/2023  History of Present Illness  62 y/o female presented to ED on 08/30/23 for possible stroke with less responsiveness and difficulty speaking. MRI showed Acute left anterior cerebral artery territory infarct and possible small focus of acute/subacute ischemia. PMH: recurrent CVAs, seizures, HLD, HTN, T2DM, expressive aphasia, liver cirrhosis, CKD stage IV  Clinical Impression  Patient admitted with the above. PTA, patient is bedbound at home with husband who provides assistance for bed level pericare. Has hospice aide who comes in Tuesday and Friday to assist with care and bath. Patient not participatory throughout session. Minimal response to painful stimuli and cold temperature applied to face. Eyes closed entire session. Required totalA+2 for repositioning in bed. Patient is currently not appropriate for skilled PT interventions as she is not actively participate or alert enough to participate as well as her baseline is bed bound for the past 4-5 months with no motivation to participate at home per husband. No further skilled PT needs identified acutely. PT will sign off.         If plan is discharge home, recommend the following: Two people to help with walking and/or transfers;Two people to help with bathing/dressing/bathroom;Assistance with cooking/housework;Assistance with feeding;Direct supervision/assist for medications management;Direct supervision/assist for financial management;Assist for transportation;Help with stairs or ramp for entrance;Supervision due to cognitive status   Can travel by private vehicle   No    Equipment Recommendations Hoyer lift  Recommendations for Other Services       Functional Status Assessment Patient has had a recent decline in their functional status and/or demonstrates limited ability to make significant improvements  in function in a reasonable and predictable amount of time     Precautions / Restrictions Precautions Precautions: Fall Restrictions Weight Bearing Restrictions Per Provider Order: No      Mobility  Bed Mobility Overal bed mobility: Needs Assistance Bed Mobility: Rolling Rolling: Total assist, +2 for physical assistance, +2 for safety/equipment              Transfers                        Ambulation/Gait                  Stairs            Wheelchair Mobility     Tilt Bed    Modified Rankin (Stroke Patients Only)       Balance                                             Pertinent Vitals/Pain Pain Assessment Pain Assessment: Faces Faces Pain Scale: No hurt Pain Intervention(s): Monitored during session    Home Living Family/patient expects to be discharged to:: Private residence Living Arrangements: Spouse/significant other Available Help at Discharge: Family;Available PRN/intermittently                    Prior Function Prior Level of Function : Needs assist             Mobility Comments: bedbound at baseline. Husband and daughter provide assistance for pericare and bathing at bed level. Hospice aide comes Tuesday and Friday to assist with bathing and care       Extremity/Trunk Assessment  Upper Extremity Assessment Upper Extremity Assessment: Difficult to assess due to impaired cognition    Lower Extremity Assessment Lower Extremity Assessment: Difficult to assess due to impaired cognition       Communication   Communication Communication: Difficulty communicating thoughts/reduced clarity of speech;Difficulty following commands/understanding  Cognition Arousal: Stuporous Behavior During Therapy: Flat affect Overall Cognitive Status: History of cognitive impairments - at baseline                                          General Comments      Exercises      Assessment/Plan    PT Assessment Patient does not need any further PT services  PT Problem List         PT Treatment Interventions      PT Goals (Current goals can be found in the Care Plan section)  Acute Rehab PT Goals Patient Stated Goal: did not state PT Goal Formulation: All assessment and education complete, DC therapy    Frequency       Co-evaluation PT/OT/SLP Co-Evaluation/Treatment: Yes Reason for Co-Treatment: For patient/therapist safety;To address functional/ADL transfers PT goals addressed during session: Mobility/safety with mobility         AM-PAC PT "6 Clicks" Mobility  Outcome Measure Help needed turning from your back to your side while in a flat bed without using bedrails?: Total Help needed moving from lying on your back to sitting on the side of a flat bed without using bedrails?: Total Help needed moving to and from a bed to a chair (including a wheelchair)?: Total Help needed standing up from a chair using your arms (e.g., wheelchair or bedside chair)?: Total Help needed to walk in hospital room?: Total Help needed climbing 3-5 steps with a railing? : Total 6 Click Score: 6    End of Session   Activity Tolerance: Other (comment) (limited by cognition) Patient left: in bed;with call bell/phone within reach;with family/visitor present Nurse Communication: Mobility status PT Visit Diagnosis: Muscle weakness (generalized) (M62.81)    Time: 2440-1027 PT Time Calculation (min) (ACUTE ONLY): 18 min   Charges:   PT Evaluation $PT Eval High Complexity: 1 High   PT General Charges $$ ACUTE PT VISIT: 1 Visit         Maylon Peppers, PT, DPT Physical Therapist - [facility]  [facility]   Jonel Sick A Irbin Fines 08/31/2023, 12:30 PM

## 2023-08-31 NOTE — ED Notes (Signed)
ECHO team at bedside

## 2023-08-31 NOTE — ED Notes (Signed)
Patient cleaned and brief/padding changed. Incontinent of urine

## 2023-08-31 NOTE — ED Notes (Signed)
MD speaking with husband and daughter about patient at this time

## 2023-08-31 NOTE — ED Notes (Signed)
Pt cleaned up after bowel movement. Brief and bed pad changed

## 2023-08-31 NOTE — Consult Note (Addendum)
NEUROLOGY CONSULT NOTE   Date of service: August 31, 2023 Patient Name: [name] Gilbert MRN:  [id number] DOB:  08/29/1961 Chief Complaint: "stopped talking for longer than normal" Requesting Provider: Marcelino Duster, MD  History of Present Illness  [name] Gilbert is a 61 y.o. 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  However her affect seemed different even on but initially was chalked up to one of her typical fluctuations.  Given she was is not improving she was brought to the ED for further evaluation yesterday and was found to have had a new stroke for which neurology is consulted     LKW: 12/17 Modified rankin score: 5-Severe disability-bedridden, incontinent, needs constant attention IV Thrombolysis: No out of the window EVT: No, out of the window   NIHSS components Score: Comment  1a Level of Conscious 0[x]  1[]  2[]  3[]      1b LOC Questions 0[]  1[x]  2[]       1c LOC Commands 0[]  1[x]  2[]       2 Best Gaze 0[]  1[x]  2[]       3 Visual 0[]  1[]  2[x]  3[]      4 Facial Palsy 0[]  1[]  2[x]  3[]      5a Motor Arm - left 0[]  1[]  2[x]  3[]  4[]  UN[]    5b Motor Arm - Right 0[]  1[]  2[]  3[x]  4[]  UN[]    6a Motor Leg - Left 0[]  1[]  2[x]  3[]  4[]  UN[]    6b Motor Leg - Right 0[]  1[]  2[]  3[x]  4[]  UN[]    7 Limb Ataxia 0[]  1[]  2[]  3[]  UN[x]     8 Sensory 0[]  1[x]  2[]  UN[]      9 Best Language 0[]  1[]  2[]  3[x]      10 Dysarthria 0[]  1[]  2[x]  UN[]      11 Extinct. and Inattention 0[x]  1[]  2[]       TOTAL:       ROS  Unable to assess secondary to patient's mental status   Past History   Past Medical History:  Diagnosis Date   Diabetes mellitus without complication (HCC)    Hypertension    Stroke (HCC)    visual impaired      Past Surgical History:  Procedure Laterality Date   ABDOMINAL HYSTERECTOMY     TONSILLECTOMY      Family History: Family History  Problem Relation Age of Onset   Diabetes Father     Social History  reports that she has never smoked. She has never been exposed to tobacco smoke. She has never used smokeless tobacco. She reports that she does not drink alcohol and does not use drugs.  Allergies  Allergen Reactions   Metformin Other (See Comments)    Reaction: unknown   Penicillins Hives, Nausea And Vomiting and Swelling    Has patient had a PCN reaction causing immediate rash, facial/tongue/throat swelling, SOB or lightheadedness with hypotension: Yes Has patient had a PCN reaction causing severe rash involving mucus membranes or skin necrosis: No Has patient had a PCN reaction that required hospitalization Yes Has patient had a PCN reaction occurring within the last 10 years: No If all of the above answers are "NO", then may proceed with Cephalosporin use.    Codeine Other (See Comments)    Reaction: HIVES   Hydralazine     Stomach  pain   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    Medications   Current Facility-Administered Medications:     stroke: early stages of recovery book, , Does not apply, Once, Crosley, Debby, MD   acetaminophen (TYLENOL) tablet 650 mg, 650 mg, Oral, Q4H PRN **OR** acetaminophen (TYLENOL) 160 MG/5ML solution 650 mg, 650 mg, Per Tube, Q4H PRN **OR** acetaminophen (TYLENOL) suppository 650 mg, 650 mg, Rectal, Q4H PRN, Crosley, Debby, MD   aspirin suppository 300 mg, 300 mg, Rectal, Daily, Crosley, Debby, MD   dextrose 5 % in lactated ringers infusion, , Intravenous, Continuous, Crosley, Debby, MD, Paused at 08/31/23 1654   heparin injection 5,000 Units, 5,000 Units, Subcutaneous, Q8H, Crosley, Debby, MD, 5,000 Units at 08/31/23 1559   insulin aspart (novoLOG) injection 0-15 Units, 0-15 Units, Subcutaneous, Q4H, Crosley, Debby, MD, 5  Units at 08/31/23 1142   levETIRAcetam (KEPPRA) IVPB 500 mg/100 mL premix, 500 mg, Intravenous, Q12H, Crosley, Debby, MD, Stopped at 08/31/23 1716   LORazepam (ATIVAN) injection 1 mg, 1 mg, Intravenous, Once PRN, Crosley, Debby, MD   metoprolol tartrate (LOPRESSOR) injection 5 mg, 5 mg, Intravenous, Q4H PRN, Crosley, Debby, MD  Current Outpatient Medications:    B Complex-C-Folic Acid (KP B COMPLEX-C) TABS, Take 1 tablet by mouth daily., Disp: , Rfl:    SENOKOT 8.6 MG tablet, Take 2 tablets by mouth at bedtime., Disp: , Rfl:    Vitamin D, Ergocalciferol, (DRISDOL) 1.25 MG (50000 UNIT) CAPS capsule, Take 50,000 Units by mouth every 7 (seven) days., Disp: , Rfl:    acetaminophen (TYLENOL) 325 MG tablet, Take 2 tablets (650 mg total) by mouth every 4 (four) hours as needed for mild pain (or temp > 37.5 C (99.5 F))., Disp: , Rfl:    amLODipine (NORVASC) 10 MG tablet, Take 10 mg by mouth daily., Disp: , Rfl:    B Complex-C (B-COMPLEX WITH VITAMIN C) tablet, Take 1 tablet by mouth daily. (Patient not taking: Reported on 10/16/2022), Disp: 30 tablet, Rfl: 0   clopidogrel (PLAVIX) 75 MG tablet, Take 1 tablet (75 mg total) by mouth daily., Disp: 30 tablet, Rfl: 11   ezetimibe (ZETIA) 10 MG tablet, Take 1 tablet (10 mg total) by mouth daily., Disp: 30 tablet, Rfl: 0   Insulin Aspart FlexPen (NOVOLOG) 100 UNIT/ML, Inject 5 Units into the skin with breakfast, with lunch, and with evening meal., Disp: 15 mL, Rfl: 0   isosorbide mononitrate (ISMO) 10 MG tablet, Take 1 tablet (10 mg total) by mouth 3 (three) times daily., Disp: 90 tablet, Rfl: 0   levETIRAcetam (KEPPRA) 250 MG tablet, Take 250 mg by mouth 2 (two) times daily., Disp: , Rfl:    metoprolol succinate (TOPROL-XL) 25 MG 24 hr tablet, Take 1 tablet (25 mg total) by mouth daily., Disp: 30 tablet, Rfl: 0   polyethylene glycol (MIRALAX / GLYCOLAX) 17 g packet, Take 17 g by mouth daily., Disp: , Rfl:    rosuvastatin (CRESTOR) 40 MG tablet, Take 0.5 tablets  (20 mg total) by mouth at bedtime., Disp: 30 tablet, Rfl: 0   senna-docusate (SENOKOT-S) 8.6-50 MG tablet, Take 2 tablets by mouth at bedtime., Disp: , Rfl:    TRESIBA FLEXTOUCH 100 UNIT/ML FlexTouch Pen, Inject 10 Units into the skin at bedtime., Disp: 15 mL, Rfl: 0  Vitals   Vitals:   08/31/23 1605 08/31/23 1630 08/31/23 1700 08/31/23 1730  BP: (!) 162/87 (!) 177/96 (!) 164/85 (!) 159/77  Pulse: 80 87 79 72  Resp:  12 11 12   Temp:      TempSrc:      SpO2: 99% 99% 99% 100%    There is no height or weight on file to calculate BMI.  Physical Exam   Constitutional: Appears chronically ill Psych: Affect flat Eyes: No scleral injection.  HENT: No OP obstruction.  Head: Normocephalic.  Cardiovascular: Normal rate and regular rhythm.  Respiratory: Effort normal, non-labored breathing.  GI: Soft.  No distension. There is no tenderness.  Skin: WDI.   Neurologic Examination   See NIH stroke scale above   Labs/Imaging/Neurodiagnostic studies   CBC:  Recent Labs  Lab 09-24-23 1730 08/31/23 0849  WBC 7.0 7.5  HGB 14.0 12.4  HCT 41.7 36.0  MCV 86.3 84.5  PLT 293 257   Basic Metabolic Panel:  Lab Results  Component Value Date   NA 138 08/31/2023   K 3.8 08/31/2023   CO2 23 08/31/2023   GLUCOSE 201 (H) 08/31/2023   BUN 25 (H) 08/31/2023   CREATININE 1.68 (H) 08/31/2023   CALCIUM 9.1 08/31/2023   GFRNONAA 34 (L) 08/31/2023   GFRAA >60 10/05/2019   Lipid Panel:  Lab Results  Component Value Date   LDLCALC 247 (H) 08/31/2023   HgbA1c:  Lab Results  Component Value Date   HGBA1C 8.3 (H) September 24, 2023   Urine Drug Screen:     Component Value Date/Time   LABOPIA NONE DETECTED 02/25/2023 0530   COCAINSCRNUR NONE DETECTED 02/25/2023 0530   LABBENZ NONE DETECTED 02/25/2023 0530   AMPHETMU NONE DETECTED 02/25/2023 0530   THCU NONE DETECTED 02/25/2023 0530   LABBARB NONE DETECTED 02/25/2023 0530    Alcohol Level     Component Value Date/Time   ETH <10 02/25/2023  0145   INR  Lab Results  Component Value Date   INR 1.1 08/31/2023   APTT  Lab Results  Component Value Date   APTT 27 02/25/2023   AED levels: No results found for: "PHENYTOIN", "ZONISAMIDE", "LAMOTRIGINE", "LEVETIRACETA"  CT Head without contrast(Personally reviewed): 1. Acute/early subacute left anterior cerebral artery distribution infarct. No hemorrhage or mass effect. 2. Old left posterior cerebral artery and right parieto-occipital infarcts.  CT angio Head and Neck with contrast(Personally reviewed): 1. Proximal occlusion of the left anterior cerebral artery A2 segment. 2. Chronic occlusion of the left PCA. 3. No hemodynamically significant stenosis of the neck.  MRI Brain(Personally reviewed): 1. Acute left anterior cerebral artery territory infarct. No hemorrhage or mass effect. 2. Minimal diffusion abnormality within the right hemispheric white matter, which may indicate a small focus of acute/subacute ischemia. 3. Old left PCA territory infarct and old right parietal infarct.   Neurodiagnostics rEEG:  - Periodic discharges with triphasic morphology, generalized ( GPDs) - Continuous slow, generalized This study is suggestive of moderate diffuse encephalopathy, likely related to toxic-metabolic causes. No seizures or definite epileptiform discharges were seen throughout the recording.  ASSESSMENT   Detailed discussion with family at bedside including review of imaging.  Given the extent of her prior left hemisphere strokes, we discussed that she will not have any formal language capacity (inability to communicate or to understand what is said to her), which is consistent with her examination.  We discussed she may still be able to process tone, recognize family members seen on the left side and interact with them to some degree (again consistent with her exam at the time of my evaluation).  They expressed that given this quality of life, given there was no  intervention that  would be expected to return her language function, they would like to focus on her comfort at this time  RECOMMENDATIONS  -Appreciate comfort care measures per palliative care team and primary team -Because seizures are very uncomfortable at end-of-life, please continue antiseizure medications. -Inpatient neurology will sign off at this time, please reach out if additional questions or concerns arise -SLP evaluation and neuro checks discontinued ______________________________________________________________________   Brooke Dare MD-PhD [facility] [phone] [facility] coverage for [facility] is from 8 AM to 4 AM in-house and 4 PM to 8 PM by telephone/video. 8 PM to 8 AM emergent questions or overnight urgent questions should be addressed to Teleneurology On-call or [facility] neurohospitalist; contact information can be found on AMION  >60 min spent in care of patient, majority in discussion with family

## 2023-08-31 NOTE — Procedures (Signed)
Patient Name: [name] Gilbert  MRN: [id number]  Epilepsy Attending: Charlsie Quest  Referring Physician/Provider: Gery Pray, MD  Date: 08/31/2023 Duration:   Patient history: 62yo F with ams getting eeg to evaluate for seizure  Level of alertness: Awake  AEDs during EEG study: LEV  Technical aspects: This EEG study was done with scalp electrodes positioned according to the 10-20 International system of electrode placement. Electrical activity was reviewed with band pass filter of 1-70Hz , sensitivity of 7 uV/mm, display speed of 55mm/sec with a 60Hz  notched filter applied as appropriate. EEG data were recorded continuously and digitally stored.  Video monitoring was available and reviewed as appropriate.  Description: EEG showed continuous generalized 3 to 6 Hz theta-delta slowing. Generalized periodic discharges with triphasic morphology at 1-1.5 Hz were also noted. Hyperventilation and photic stimulation were not performed.     ABNORMALITY - Periodic discharges with triphasic morphology, generalized ( GPDs) - Continuous slow, generalized  IMPRESSION: This study is suggestive of moderate diffuse encephalopathy, likely related to toxic-metabolic causes. No seizures or definite epileptiform discharges were seen throughout the recording.  [name] Gilbert

## 2023-08-31 NOTE — ED Notes (Signed)
Bed alarm on. Pt sleeping. Rise and fall of chest noted.

## 2023-08-31 NOTE — Consult Note (Signed)
Consultation Note Date: 08/31/2023   Patient Name: [name] Gilbert  DOB: 01-10-1961  MRN: [id number]  Age / Sex: 62 y.o., female  PCP: Tobey Grim, MD Referring Physician: Marcelino Duster, MD  Reason for Consultation: Establishing goals of care   HPI/Brief Hospital Course: 62 y.o. female  with past medical history of recurrent CVA's, seizure disorder, HTN, HLD, T2DM, expressive aphasia, liver cirrhosis and CKD stage IV admitted from home on 08/30/2023 with unresponsiveness. Per reports from family, when daughter checked on Ms. Kirkey she was found to be unresponsive, was in usual state of health hours before when husband left for work.   CT head revealed IMPRESSION: 1. Acute/early subacute left anterior cerebral artery distribution infarct. No hemorrhage or mass effect. 2. Old left posterior cerebral artery and right parieto-occipital infarcts.  MRI revealed IMPRESSION: 1. Acute left anterior cerebral artery territory infarct. No hemorrhage or mass effect. 2. Minimal diffusion abnormality within the right hemispheric white matter, which may indicate a small focus of acute/subacute ischemia. 3. Old left PCA territory infarct and old right parietal infarct.  Palliative medicine was consulted for assisting with goals of care, possibly enrolled with hospice agency currently.  Subjective:  Extensive chart review has been completed prior to meeting patient including labs, vital signs, imaging, progress notes, orders, and available advanced directive documents from current and previous encounters.  Visited with Ms. Monzingo at her bedside, she does not acknowledge my presence in room. Daughter and husband at bedside during my visit.  Introduced myself as a Publishing rights manager as a member of the palliative care team. Explained palliative medicine is specialized medical care for people living with serious illness. It focuses on providing relief  from the symptoms and stress of a serious illness. The goal is to improve quality of life for both the patient and the family.   Family confirms Ms. Headen is actively enrolled with [facility] out of [place]. They express their concerns related to the care she has received through this agency. Daughter and husband are primary caregivers. At baseline, Ms. Ketchen is able to communicate and engage with her family. Daughter shares a noted functional decline over the last several months.  We discussed patient's current illness and what it means in the larger context of patient's on-going co-morbidities. Natural disease trajectory and expectations at EOL were discussed.   Daughter and husband share they wish to continue with hospice services. They are interested in transitioning to a local hospice agency and are ultimately interested in her transitioning to local hospice home. We briefly discussed eligibility criteria--agree to speaking with [facility] hospice liaison.  TOC and [facility] HL engaged.  All questions/concerns addressed. PMT will continue to follow and support patient as needed.  Objective: Primary Diagnoses: Present on Admission:  Dyslipidemia  Essential hypertension  Expressive aphasia  Liver cirrhosis (HCC)  Stage 3b chronic kidney disease (CKD) (HCC)  Type II diabetes mellitus with renal manifestations (HCC)  Acute CVA (cerebrovascular accident) (HCC)  Assessment and Plan  SUMMARY OF RECOMMENDATIONS   Hospice liaison engaged for [facility] evaluation  Palliative Prophylaxis:   Bowel Regimen, Delirium Protocol and Frequent Pain Assessment  Discussed With: Primary team, TOC, nursing staff and HL.   Thank you for this consult and allowing Palliative Medicine to participate in the care of Xanthia Kurihara. Palliative medicine will continue to follow and assist as needed.   Time Total: 55 minutes  Time spent includes: Detailed review of medical records (labs, imaging, vital signs),  medically appropriate exam (mental  status, respiratory, cardiac, skin), discussed with treatment team, counseling and educating patient, family and staff, documenting clinical information, medication management and coordination of care.   Signed by: Leeanne Deed, DNP, AGNP-C Palliative Medicine    Please contact Palliative Medicine Team phone at [phone] for questions and concerns.  For individual provider: See Loretha Stapler

## 2023-08-31 NOTE — Evaluation (Signed)
Occupational Therapy Evaluation Patient Details Name: [name] Gilbert MRN: [id number] DOB: 1960/11/21 Today's Date: 08/31/2023   History of Present Illness 62 y/o female presented to ED on 08/30/23 for possible stroke with less responsiveness and difficulty speaking. MRI showed Acute left anterior cerebral artery territory infarct and possible small focus of acute/subacute ischemia. PMH: recurrent CVAs, seizures, HLD, HTN, T2DM, expressive aphasia, liver cirrhosis, CKD stage IV   Clinical Impression   Pt was seen for PT/OT evaluation this date. Prior to hospital admission, pt lives at home with her husband. He reports she is bedbound at home, has been through therapy multiple times and will participate while there and then not to anything with family upon return home. She was able to wash her face and feed herself at baseline, otherwise dependent. Has hospice aide who comes in Tuesday and Friday to assist with care and bath.   Pt admitted d/t the above. She was not not participatory throughout session with minimal response to painful stimuli and cold temperature applied to face. She did not open her eyes throughout session and needed total assist x2 for repositioning in bed. Eyes closed entire session. Required totalA+2 for repositioning in bed. Due to her current status/minimal responsiveness and inability to participate, she is not appropriate for skilled OT services. Baseline is bed bound for the past 4-5 months with no motivation to participate at home per husband. OT to DC in house/sign off. Can be re-consulted if pt shows a change in status with ability to better participate.        If plan is discharge home, recommend the following:      Functional Status Assessment  Patient has had a recent decline in their functional status and/or demonstrates limited ability to make significant improvements in function in a reasonable and predictable amount of time  Equipment Recommendations  Hoyer  lift    Recommendations for Other Services       Precautions / Restrictions Precautions Precautions: Fall Restrictions Weight Bearing Restrictions Per Provider Order: No      Mobility Bed Mobility Overal bed mobility: Needs Assistance Bed Mobility: Rolling Rolling: Total assist, +2 for physical assistance, +2 for safety/equipment              Transfers                   General transfer comment: deferred-does not perform at baseline      Balance                                           ADL either performed or assessed with clinical judgement   ADL Overall ADL's : Needs assistance/impaired     Grooming: Wash/dry face;Total assistance;Bed level                                       Vision         Perception         Praxis         Pertinent Vitals/Pain Pain Assessment Pain Assessment: Faces Faces Pain Scale: No hurt Pain Intervention(s): Monitored during session     Extremity/Trunk Assessment Upper Extremity Assessment Upper Extremity Assessment: Difficult to assess due to impaired cognition   Lower Extremity Assessment Lower Extremity Assessment: Difficult to assess due to impaired  cognition       Communication Communication Communication: Difficulty communicating thoughts/reduced clarity of speech;Difficulty following commands/understanding   Cognition Arousal: Stuporous Behavior During Therapy: Flat affect Overall Cognitive Status: History of cognitive impairments - at baseline                                       General Comments  pt not alert; eyes closed throughout evaluations    Exercises Other Exercises Other Exercises: Spoke with husband at bedside regarding her status-he is interested in comfort care.   Shoulder Instructions      Home Living Family/patient expects to be discharged to:: Private residence Living Arrangements: Spouse/significant other Available Help  at Discharge: Family;Available PRN/intermittently                                    Prior Functioning/Environment Prior Level of Function : Needs assist             Mobility Comments: bedbound at baseline. Husband and daughter provide assistance for pericare and bathing at bed level. Hospice aide comes Tuesday and Friday to assist with bathing and care ADLs Comments: pt only able to wash face and feed self at baseline        OT Problem List:        OT Treatment/Interventions:      OT Goals(Current goals can be found in the care plan section)    OT Frequency:      Co-evaluation PT/OT/SLP Co-Evaluation/Treatment: Yes Reason for Co-Treatment: For patient/therapist safety;To address functional/ADL transfers PT goals addressed during session: Mobility/safety with mobility        AM-PAC OT "6 Clicks" Daily Activity     Outcome Measure Help from another person eating meals?: Total Help from another person taking care of personal grooming?: Total Help from another person toileting, which includes using toliet, bedpan, or urinal?: Total Help from another person bathing (including washing, rinsing, drying)?: Total Help from another person to put on and taking off regular upper body clothing?: Total Help from another person to put on and taking off regular lower body clothing?: Total 6 Click Score: 6   End of Session Nurse Communication: Mobility status  Activity Tolerance: Patient limited by lethargy Patient left: in bed;with call bell/phone within reach;with family/visitor present  OT Visit Diagnosis: Other abnormalities of gait and mobility (R26.89);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

## 2023-08-31 NOTE — Progress Notes (Signed)
Eeg done 

## 2023-08-31 NOTE — ED Notes (Signed)
Patient transported to MRI 

## 2023-08-31 NOTE — ED Notes (Signed)
Family "El" updated as to patient's status.

## 2023-08-31 NOTE — Progress Notes (Addendum)
[facility] ROOM ED [facility] Liaison note   Received request from Transitions of Care Manger for family interest in the Hospice Home.  Chart reviewed and spoke with family to acknowledge referral.  Hospice Home eligibility confirmed.    Unfortunately, The Hospice Home does not have a bed available at this time   Family and Transitions of Care Manager aware hospital liaison will follow up tomorrow or sooner if bed becomes available. HL will also monitor patient's condition for appropriateness.    Please do not hesitate to call with any Hospice related questions. Thank you  [facility] Liaison [phone]

## 2023-08-31 NOTE — Progress Notes (Signed)
*  PRELIMINARY RESULTS* Echocardiogram 2D Echocardiogram has been performed.  [name] 08/31/2023, 11:32 AM

## 2023-08-31 NOTE — TOC Initial Note (Signed)
Transition of Care [facility]) - Initial/Assessment Note    Patient Details  Name: [name] Gilbert MRN: [id number] Date of Birth: Sep 14, 1960  Transition of Care [facility]) CM/SW Contact:    Marquita Palms, LCSW Phone Number: 08/31/2023, 4:24 PM  Clinical Narrative:                  CSW met with daughter of patient. Patient was being cared for at home by her daughter. Patients family spoke with Ree Kida with [facility]. Patients daughter and husband have been her with family. Awaiting medical workup.        Patient Goals and CMS Choice            Expected Discharge Plan and Services                                              Prior Living Arrangements/Services                       Activities of Daily Living      Permission Sought/Granted                  Emotional Assessment              Admission diagnosis:  Acute CVA (cerebrovascular accident) [facility]) [I63.9] Patient Active Problem List   Diagnosis Date Noted   Acute CVA (cerebrovascular accident) (HCC) 08/30/2023   Iron deficiency anemia 03/01/2023   Liver cirrhosis (HCC) 03/01/2023   CKD (chronic kidney disease), stage IV (HCC) 02/28/2023   CVA (cerebral vascular accident) (HCC) 02/25/2023   Expressive aphasia 02/25/2023   Acute proctitis 01/22/2023   Acute renal failure superimposed on stage 3b chronic kidney disease (HCC) 01/22/2023   Constipation 01/22/2023   History of stroke 01/22/2023   TIA (transient ischemic attack) 01/01/2023   Essential hypertension 01/01/2023   Seizure disorder (HCC) 01/01/2023   Dyslipidemia 01/01/2023   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   Advanced care planning/counseling discussion 02/02/2022   Left thalamic infarction (HCC) 02/02/2022   Hyperkalemia 02/01/2022   Closed fracture of right distal fibula    HLD (hyperlipidemia) 01/25/2022   Type II diabetes mellitus with renal manifestations (HCC) 01/25/2022   Stage 3b chronic kidney disease  (CKD) (HCC) 01/25/2022   Neurologic abnormality 06/13/2021   Dehydration 06/12/2021   Headache 06/12/2021   Abdominal pain 06/12/2021   Hypoglycemia associated with diabetes (HCC) 06/12/2021   Acute respiratory failure with hypoxia (HCC)    HHNC (hyperglycemic hyperosmolar nonketotic coma) (HCC) 10/01/2019   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   Hypoglycemia 01/26/2016   PCP:  Tobey Grim, MD Pharmacy:   [facility] [phone] [place], [place] - [address] AT [facility] OF [address] [place] [address] Phone: [phone] Fax: [phone]  [facility] #[id number] [place], [place] - [address] AT [facility] OF [address] [place] [address] Phone: [phone] Fax: [phone]     Social Drivers of Health (SDOH) Social History: SDOH Screenings   Food Insecurity: No Food Insecurity (02/25/2023)  Housing: Low Risk  (02/25/2023)  Transportation Needs: No Transportation Needs (02/25/2023)  Utilities: Not At Risk (02/25/2023)  Financial Resource Strain: Low Risk  (12/21/2021)   Received from [facility], [facility]  Physical Activity: Inactive (12/21/2021)   Received from [facility], [facility]  Social Connections: Socially Integrated (12/21/2021)   Received from [facility], [facility]  Stress: No Stress Concern Present (12/21/2021)   Received from [facility], [facility]  Tobacco Use: Low Risk  (08/30/2023)  Health Literacy: Low Risk  (12/16/2020)   Received from [facility], [facility]   SDOH Interventions:     Readmission Risk Interventions    08/31/2023    4:23 PM  Readmission Risk Prevention Plan  Transportation Screening Complete  PCP or Specialist Appt within 3-5 Days Complete  HRI or Home  Care Consult Complete  Social Work Consult for Recovery Care Planning/Counseling Complete  Palliative Care Screening Complete  Medication Review Oceanographer) Complete

## 2023-09-01 ENCOUNTER — Encounter: Payer: Self-pay | Admitting: Family Medicine

## 2023-09-01 DIAGNOSIS — E1122 Type 2 diabetes mellitus with diabetic chronic kidney disease: Secondary | ICD-10-CM | POA: Diagnosis not present

## 2023-09-01 DIAGNOSIS — R4182 Altered mental status, unspecified: Secondary | ICD-10-CM | POA: Diagnosis not present

## 2023-09-01 DIAGNOSIS — Z515 Encounter for palliative care: Secondary | ICD-10-CM | POA: Diagnosis not present

## 2023-09-01 DIAGNOSIS — I10 Essential (primary) hypertension: Secondary | ICD-10-CM | POA: Diagnosis not present

## 2023-09-01 DIAGNOSIS — R4701 Aphasia: Secondary | ICD-10-CM | POA: Diagnosis not present

## 2023-09-01 DIAGNOSIS — I639 Cerebral infarction, unspecified: Secondary | ICD-10-CM | POA: Diagnosis not present

## 2023-09-01 LAB — URINALYSIS, W/ REFLEX TO CULTURE (INFECTION SUSPECTED)
Bilirubin Urine: NEGATIVE
Glucose, UA: 50 mg/dL — AB
Ketones, ur: 5 mg/dL — AB
Leukocytes,Ua: NEGATIVE
Nitrite: NEGATIVE
Protein, ur: 100 mg/dL — AB
Specific Gravity, Urine: >1.046 — ABNORMAL HIGH (ref 1.005–1.030)
pH: 5 (ref 5.0–8.0)

## 2023-09-01 LAB — GLUCOSE, CAPILLARY: Glucose-Capillary: 238 mg/dL — ABNORMAL HIGH (ref 70–99)

## 2023-09-01 LAB — CBG MONITORING, ED: Glucose-Capillary: 206 mg/dL — ABNORMAL HIGH (ref 70–99)

## 2023-09-01 MED ORDER — ONDANSETRON HCL 4 MG/2ML IJ SOLN: 4.0000 mg | Freq: Four times a day (QID) | INTRAMUSCULAR | Status: DC | PRN

## 2023-09-01 MED ORDER — IPRATROPIUM-ALBUTEROL 0.5-2.5 (3) MG/3ML IN SOLN
3.0000 mL | Freq: Four times a day (QID) | RESPIRATORY_TRACT | Status: DC
  Filled 2023-09-01: qty 3

## 2023-09-01 MED ORDER — BISACODYL 10 MG RE SUPP: 10.0000 mg | Freq: Every day | RECTAL | Status: DC | PRN

## 2023-09-01 MED ORDER — ONDANSETRON 4 MG PO TBDP: 4.0000 mg | ORAL_TABLET | Freq: Four times a day (QID) | ORAL | Status: DC | PRN

## 2023-09-01 NOTE — Progress Notes (Signed)
Daily Progress Note   Patient Name: [name] Gilbert       Date: 09/01/2023 DOB: Jul 27, 1961  Age: 62 y.o. MRN#: [id number] Attending Physician: Marcelino Duster, MD Primary Care Physician: Tobey Grim, MD Admit Date: 08/30/2023  Reason for Consultation/Follow-up: Establishing goals of care  HPI/Brief Hospital Review:  62 y.o. female  with past medical history of recurrent CVA's, seizure disorder, HTN, HLD, T2DM, expressive aphasia, liver cirrhosis and CKD stage IV admitted from home on 08/30/2023 with unresponsiveness. Per reports from family, when daughter checked on Ms. Leinweber she was found to be unresponsive, was in usual state of health hours before when husband left for work.    CT head revealed IMPRESSION: 1. Acute/early subacute left anterior cerebral artery distribution infarct. No hemorrhage or mass effect. 2. Old left posterior cerebral artery and right parieto-occipital infarcts.   MRI revealed IMPRESSION: 1. Acute left anterior cerebral artery territory infarct. No hemorrhage or mass effect. 2. Minimal diffusion abnormality within the right hemispheric white matter, which may indicate a small focus of acute/subacute ischemia. 3. Old left PCA territory infarct and old right parietal infarct.   Palliative medicine was consulted for assisting with goals of care, possibly enrolled with hospice agency currently.  Subjective: Extensive chart review has been completed prior to meeting patient including labs, vital signs, imaging, progress notes, orders, and available advanced directive documents from current and previous encounters.    Visited with [name] Gilbert at her bedside. She is resting in bed, does open eyes, does not communicate or follow commands. Appears  comfortable without any signs of discomfort or distress. Husband at bedside during time of visit.  Renea Ee, RN HL with [facility] present during visit as well. Diannia Ruder engaged with husband in discussing disposition options. Diannia Ruder to return to bedside once daughter arrives. Anticipate discharge home with hospice services following.  Answered and addressed all questions and concerns. PMT to continue to follow for ongoing needs and support.  Thank you for allowing the Palliative Medicine Team to assist in the care of this patient.  Total time:  25 minutes  Time spent includes: Detailed review of medical records (labs, imaging, vital signs), medically appropriate exam (mental status, respiratory, cardiac, skin), discussed with treatment team, counseling and educating patient, family and staff, documenting clinical information, medication management and coordination of care.  Leeanne Deed, DNP, AGNP-C Palliative Medicine   Please contact Palliative Medicine Team phone at [phone] for questions and concerns.

## 2023-09-01 NOTE — Progress Notes (Signed)
Civil engineer, contracting [facility] Hospital Note  Follow up with patient's daughter at the bedside to discuss IPU/routine bed vs. Taking patient home with [facility] hospice.  After a long discussion and time given to answer questions- both daughter and husband are agreeable to take patient back home with hospice services.  Will need to order hospital bed and over bed table in the morning.  Hospital Medical Care Team notified of the above information.  Continued collaboration and communication with hospital team and family through final disposition.  Norris Cross, RN Nurse Liaison [phone]

## 2023-09-01 NOTE — Plan of Care (Signed)

## 2023-09-01 NOTE — Progress Notes (Signed)
Progress Note   Patient: Natalie Cuadros WUJ:[id number] DOB: May 02, 1961 DOA: 08/30/2023     2 DOS: the patient was seen and examined on 09/01/2023   Brief hospital course: Meda Pascuzzi is a 62 year old female with past medical history of recurrent CVAs, seizures, HLD, HTN, T2DM, expressive aphasia, liver cirrhosis, CKD stage IV, bed bound, at baseline patient with left-sided deficit, aphasia, lower extremity rigidity presented to ED for poor interaction than usual, decreased p.o. intake, worsening right upper extremity contracture.   CT head shows acute/early subacute left anterior cerebral artery distribution infarct. Admitted to hospitalist service for further management and evaluation of stroke. Patient is previously followed by hospice services at home.  Assessment and Plan: Acute CVA Right sided hemiplegia, aphasia. Palliative team discussed with family who agreed with to initiate hospice care services. Neurology team did talk to family advised hospice care.  Seizure disorder: Continue Keppra 500mg  q12hr. Seizure precautions.  Hypertension Hyperlipidemia Liver cirrhosis Type 2 Diabetes  Will continue only supportive care. Hold off accucheks, labs and frequent vitals. Palliative care order set placed.  Hospice liaison evaluated her advised for inpatient services, awaiting bed placement at hospice home.     Nursing supportive care. Fall, aspiration precautions. DVT prophylaxis   Code Status: Do not attempt resuscitation (DNR) - Comfort care  Subjective: Patient is seen and examined today morning in ED. She opened eyes, smiled at husband, does not follow commands. I discussed with patient's husband at bedside about comfort care measures only.   Physical Exam: Vitals:   09/01/23 0700 09/01/23 0730 09/01/23 0830 09/01/23 1556  BP: 124/66 (!) 140/75 (!) 169/87 (!) 132/110  Pulse: 68 75 90 81  Resp: (!) 9 (!) 9 11 18   Temp:    97.8 F (36.6 C)  TempSrc:    Axillary   SpO2: 100% 100% 99% 98%    General - Elderly African American female, awake, no distress HEENT - PERRLA, EOMI, atraumatic head, non tender sinuses. Lung - Clear, diffuse rales, rhonchi, no wheezes. Heart - S1, S2 heard, no murmurs, rubs, trace pedal edema. Abdomen - soft, non tender, no distention. Neuro - alert, opens eyes, did not follow commands, unable to do full neuro exam. Skin - Warm and dry.  Data Reviewed:      Latest Ref Rng & Units 08/31/2023    8:49 AM 08/30/2023    5:30 PM 03/14/2023    3:09 PM  CBC  WBC 4.0 - 10.5 K/uL 7.5  7.0  7.6   Hemoglobin 12.0 - 15.0 g/dL 95.6  21.3  08.6   Hematocrit 36.0 - 46.0 % 36.0  41.7  35.9   Platelets 150 - 400 K/uL 257  293  327       Latest Ref Rng & Units 08/31/2023    8:49 AM 08/30/2023    5:30 PM 03/14/2023    3:09 PM  BMP  Glucose 70 - 99 mg/dL 578  469  629   BUN 8 - 23 mg/dL 25  28  36   Creatinine 0.44 - 1.00 mg/dL 5.28  4.13  2.44   Sodium 135 - 145 mmol/L 138  140  138   Potassium 3.5 - 5.1 mmol/L 3.8  3.8  4.4   Chloride 98 - 111 mmol/L 104  102  103   CO2 22 - 32 mmol/L 23  25  23    Calcium 8.9 - 10.3 mg/dL 9.1  9.8  9.2    EEG adult Result Date: 08/31/2023 Charlsie Quest, MD  08/31/2023  2:24 PM Patient Name: Gatha Jentzen MRN: [id number] Epilepsy Attending: Charlsie Quest Referring Physician/Provider: Gery Pray, MD Date: 08/31/2023 Duration: Patient history: 62yo F with ams getting eeg to evaluate for seizure Level of alertness: Awake AEDs during EEG study: LEV Technical aspects: This EEG study was done with scalp electrodes positioned according to the 10-20 International system of electrode placement. Electrical activity was reviewed with band pass filter of 1-70Hz , sensitivity of 7 uV/mm, display speed of 19mm/sec with a 60Hz  notched filter applied as appropriate. EEG data were recorded continuously and digitally stored.  Video monitoring was available and reviewed as appropriate. Description: EEG  showed continuous generalized 3 to 6 Hz theta-delta slowing. Generalized periodic discharges with triphasic morphology at 1-1.5 Hz were also noted. Hyperventilation and photic stimulation were not performed.   ABNORMALITY - Periodic discharges with triphasic morphology, generalized ( GPDs) - Continuous slow, generalized IMPRESSION: This study is suggestive of moderate diffuse encephalopathy, likely related to toxic-metabolic causes. No seizures or definite epileptiform discharges were seen throughout the recording. Charlsie Quest   ECHOCARDIOGRAM COMPLETE Result Date: 08/31/2023    ECHOCARDIOGRAM REPORT   Patient Name:   CATALEAH BERON Date of Exam: 08/31/2023 Medical Rec #:  [id number]        Height:       62.0 in Accession #:    [id number]       Weight:       138.9 lb Date of Birth:  1960/12/28        BSA:          1.637 m Patient Age:    62 years         BP:           111/64 mmHg Patient Gender: F                HR:           96 bpm. Exam Location:  [facility] Procedure: 2D Echo, Cardiac Doppler and Color Doppler Indications:     TIA  History:         Patient has prior history of Echocardiogram examinations, most                  recent 10/15/2022. Stroke and TIA; Risk Factors:Hypertension,                  Diabetes, Dyslipidemia and Non-Smoker. CKD, TDS-Patient's right                  arm is contracted over her chest.  Sonographer:     Mikki Harbor Referring Phys:  [id number] DEBBY CROSLEY Diagnosing Phys: Debbe Odea MD  Sonographer Comments: Technically challenging study due to limited acoustic windows, suboptimal parasternal window and suboptimal apical window. IMPRESSIONS  1. Left ventricular ejection fraction, by estimation, is 60 to 65%. The left ventricle has normal function. The left ventricle has no regional wall motion abnormalities. Left ventricular diastolic parameters are consistent with Grade I diastolic dysfunction (impaired relaxation).  2. Right ventricular systolic function is normal. The  right ventricular size is normal. There is normal pulmonary artery systolic pressure.  3. The mitral valve is normal in structure. No evidence of mitral valve regurgitation.  4. The aortic valve was not well visualized. Aortic valve regurgitation is not visualized.  5. The inferior vena cava is normal in size with greater than 50% respiratory variability, suggesting right atrial pressure of 3 mmHg. FINDINGS  Left Ventricle: Left ventricular ejection fraction, by  estimation, is 60 to 65%. The left ventricle has normal function. The left ventricle has no regional wall motion abnormalities. The left ventricular internal cavity size was normal in size. There is  no left ventricular hypertrophy. Left ventricular diastolic parameters are consistent with Grade I diastolic dysfunction (impaired relaxation). Right Ventricle: The right ventricular size is normal. No increase in right ventricular wall thickness. Right ventricular systolic function is normal. There is normal pulmonary artery systolic pressure. The tricuspid regurgitant velocity is 1.44 m/s, and  with an assumed right atrial pressure of 3 mmHg, the estimated right ventricular systolic pressure is 11.3 mmHg. Left Atrium: Left atrial size was normal in size. Right Atrium: Right atrial size was normal in size. Pericardium: There is no evidence of pericardial effusion. Mitral Valve: The mitral valve is normal in structure. No evidence of mitral valve regurgitation. MV peak gradient, 4.3 mmHg. The mean mitral valve gradient is 2.0 mmHg. Tricuspid Valve: The tricuspid valve is normal in structure. Tricuspid valve regurgitation is not demonstrated. Aortic Valve: The aortic valve was not well visualized. Aortic valve regurgitation is not visualized. Aortic valve mean gradient measures 2.0 mmHg. Aortic valve peak gradient measures 4.5 mmHg. Aortic valve area, by VTI measures 2.17 cm. Pulmonic Valve: The pulmonic valve was not well visualized. Pulmonic valve  regurgitation is not visualized. Aorta: The aortic root is normal in size and structure. Venous: The inferior vena cava is normal in size with greater than 50% respiratory variability, suggesting right atrial pressure of 3 mmHg. IAS/Shunts: No atrial level shunt detected by color flow Doppler.  LEFT VENTRICLE PLAX 2D LVIDd:         3.30 cm   Diastology LVIDs:         1.90 cm   LV e' medial:    5.33 cm/s LV PW:         0.80 cm   LV E/e' medial:  8.0 LV IVS:        0.90 cm   LV e' lateral:   5.00 cm/s LVOT diam:     1.90 cm   LV E/e' lateral: 8.5 LV SV:         45 LV SV Index:   27 LVOT Area:     2.84 cm  RIGHT VENTRICLE RV Basal diam:  2.50 cm RV Mid diam:    2.40 cm RV S prime:     15.30 cm/s LEFT ATRIUM             Index        RIGHT ATRIUM          Index LA diam:        2.30 cm 1.40 cm/m   RA Area:     7.67 cm LA Vol (A2C):   19.4 ml 11.85 ml/m  RA Volume:   14.80 ml 9.04 ml/m LA Vol (A4C):   18.5 ml 11.30 ml/m LA Biplane Vol: 20.2 ml 12.34 ml/m  AORTIC VALVE                    PULMONIC VALVE AV Area (Vmax):    2.46 cm     PV Vmax:       1.04 m/s AV Area (Vmean):   2.20 cm     PV Peak grad:  4.3 mmHg AV Area (VTI):     2.17 cm AV Vmax:           106.00 cm/s AV Vmean:  71.500 cm/s AV VTI:            0.205 m AV Peak Grad:      4.5 mmHg AV Mean Grad:      2.0 mmHg LVOT Vmax:         91.80 cm/s LVOT Vmean:        55.400 cm/s LVOT VTI:          0.157 m LVOT/AV VTI ratio: 0.77  AORTA Ao Root diam: 2.70 cm MITRAL VALVE               TRICUSPID VALVE MV Area (PHT): 5.66 cm    TR Peak grad:   8.3 mmHg MV Area VTI:   2.93 cm    TR Vmax:        144.00 cm/s MV Peak grad:  4.3 mmHg MV Mean grad:  2.0 mmHg    SHUNTS MV Vmax:       1.04 m/s    Systemic VTI:  0.16 m MV Vmean:      55.7 cm/s   Systemic Diam: 1.90 cm MV Decel Time: 134 msec MV E velocity: 42.40 cm/s MV A velocity: 96.90 cm/s MV E/A ratio:  0.44 Debbe Odea MD Electronically signed by Debbe Odea MD Signature Date/Time:  08/31/2023/1:13:28 PM    Final    CT ANGIO HEAD NECK W WO CM Result Date: 08/31/2023 CLINICAL DATA:  Right upper extremity weakness EXAM: CT ANGIOGRAPHY HEAD AND NECK WITH AND WITHOUT CONTRAST TECHNIQUE: Multidetector CT imaging of the head and neck was performed using the standard protocol during bolus administration of intravenous contrast. Multiplanar CT image reconstructions and MIPs were obtained to evaluate the vascular anatomy. Carotid stenosis measurements (when applicable) are obtained utilizing NASCET criteria, using the distal internal carotid diameter as the denominator. RADIATION DOSE REDUCTION: This exam was performed according to the departmental dose-optimization program which includes automated exposure control, adjustment of the mA and/or kV according to patient size and/or use of iterative reconstruction technique. CONTRAST:  60mL OMNIPAQUE IOHEXOL 350 MG/ML SOLN COMPARISON:  Head CT 08/30/2023, brain MRI 08/31/2023, CTA head neck 02/25/2023 FINDINGS: CTA NECK FINDINGS Skeleton: No acute abnormality or high grade bony spinal canal stenosis. Other neck: Normal pharynx, larynx and major salivary glands. No cervical lymphadenopathy. Unremarkable thyroid gland. Upper chest: No pneumothorax or pleural effusion. No nodules or masses. Aortic arch: There is no calcific atherosclerosis of the aortic arch. Conventional 3 vessel aortic branching pattern. RIGHT carotid system: Normal without aneurysm, dissection or stenosis. LEFT carotid system: No dissection, occlusion or aneurysm. Mild atherosclerotic calcification at the carotid bifurcation without hemodynamically significant stenosis. Generally diminutive left ICA, unchanged. Vertebral arteries: Left dominant configuration. There is no dissection, occlusion or flow-limiting stenosis to the skull base (V1-V3 segments). CTA HEAD FINDINGS POSTERIOR CIRCULATION: Vertebral arteries are normal. No proximal occlusion of the anterior or inferior cerebellar  arteries. Basilar artery is normal. Superior cerebellar arteries are normal. Chronic occlusion of the left PCA. ANTERIOR CIRCULATION: Atherosclerotic calcification of the internal carotid arteries at the skull base without hemodynamically significant stenosis. There is proximal occlusion of the left anterior cerebral artery A2 segment. The right ACA is normal. Bilateral atherosclerotic irregularity without high-grade stenosis. Venous sinuses: As permitted by contrast timing, patent. Anatomic variants: None Review of the MIP images confirms the above findings. IMPRESSION: 1. Proximal occlusion of the left anterior cerebral artery A2 segment. 2. Chronic occlusion of the left PCA. 3. No hemodynamically significant stenosis of the neck. These results will be called to  the ordering clinician or representative by the Radiologist Assistant, and communication documented in the PACS or [employer]. Electronically Signed   By: Deatra Robinson M.D.   On: 08/31/2023 02:06   MR BRAIN WO CONTRAST Result Date: 08/31/2023 CLINICAL DATA:  Transient ischemic attack EXAM: MRI HEAD WITHOUT CONTRAST TECHNIQUE: Multiplanar, multiecho pulse sequences of the brain and surrounding structures were obtained without intravenous contrast. COMPARISON:  None Available. FINDINGS: Brain: Abnormal diffusion restriction throughout the left anterior cerebral artery territory. Minimal diffusion abnormality within the right hemispheric white matter. Chronic microhemorrhages in a predominantly central distribution, including the pons. Chronic petechial hemorrhage over the posterior left hemisphere. There is confluent hyperintense T2-weighted signal within the white matter. Generalized volume loss. 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 Old right parietal infarct. The midline structures are normal. Vascular: Normal flow voids. Skull and upper cervical spine: Normal calvarium and skull base. Visualized upper cervical spine and soft tissues are normal.  Sinuses/Orbits:No paranasal sinus fluid levels or advanced mucosal thickening. No mastoid or middle ear effusion. Normal orbits. IMPRESSION: 1. Acute left anterior cerebral artery territory infarct. No hemorrhage or mass effect. 2. Minimal diffusion abnormality within the right hemispheric white matter, which may indicate a small focus of acute/subacute ischemia. 3. Old left PCA territory infarct and old right parietal infarct. Electronically Signed   By: Deatra Robinson M.D.   On: 08/31/2023 01:37   CT Head Wo Contrast Result Date: 08/30/2023 CLINICAL DATA:  Altered mental status EXAM: CT HEAD WITHOUT CONTRAST TECHNIQUE: Contiguous axial images were obtained from the base of the skull through the vertex without intravenous contrast. RADIATION DOSE REDUCTION: This exam was performed according to the departmental dose-optimization program which includes automated exposure control, adjustment of the mA and/or kV according to patient size and/or use of iterative reconstruction technique. COMPARISON:  03/10/2023 FINDINGS: Brain: There is an area of cytotoxic edema within the left anterior cerebral artery distribution, likely an acute/early subacute infarct. There is encephalomalacia in the posterior left hemisphere that is unchanged. There is also an old right parieto-occipital infarct. Generalized white matter changes of chronic small vessel ischemia. No acute hemorrhage. Vascular: There is atherosclerotic calcification of both internal carotid arteries at the skull base. Skull: Negative Sinuses/Orbits: No acute finding. Other: None. IMPRESSION: 1. Acute/early subacute left anterior cerebral artery distribution infarct. No hemorrhage or mass effect. 2. Old left posterior cerebral artery and right parieto-occipital infarcts. Electronically Signed   By: Deatra Robinson M.D.   On: 08/30/2023 19:10     Family Communication: Discussed with husband at bedside, he understand and agree. All questions  answereed.  Disposition: Status is: Inpatient Remains inpatient appropriate because: hospice home bed placement  Planned Discharge Destination:  Hospice facility.     Time spent: 40 minutes  Author: Marcelino Duster, MD 09/01/2023 4:13 PM Secure chat 7am to 7pm For on call review [email or web address].

## 2023-09-01 NOTE — Progress Notes (Signed)
[facility] Liaison Note  Follow up from new referral for IPU who was approved yesterday for routine level of care.  I spoke with patient's husband regarding what hospice at home would look like with [facility].  Patient had been on hospice services with [facility] in [place], but were not pleased with their care and wanted to transfer hospices.  After discussion with husband, he was agreeable to take patient home but wanted me to have a conversation with his daughter, who would be at [facility] after 3pm.   Patient awake today and tracking movement and voices across room.  Patient does not answer any questions and does not try to interact.  We have no hospice beds available at the [facility].  Patient has several patients on the list before her.    Will follow up with daughter later today to discuss home with hospice.  Norris Cross, RN Nurse Liaison [phone]

## 2023-09-02 DIAGNOSIS — E1122 Type 2 diabetes mellitus with diabetic chronic kidney disease: Secondary | ICD-10-CM | POA: Diagnosis not present

## 2023-09-02 DIAGNOSIS — R4701 Aphasia: Secondary | ICD-10-CM | POA: Diagnosis not present

## 2023-09-02 DIAGNOSIS — I10 Essential (primary) hypertension: Secondary | ICD-10-CM | POA: Diagnosis not present

## 2023-09-02 DIAGNOSIS — I639 Cerebral infarction, unspecified: Secondary | ICD-10-CM | POA: Diagnosis not present

## 2023-09-02 LAB — GLUCOSE, CAPILLARY
Glucose-Capillary: 241 mg/dL — ABNORMAL HIGH (ref 70–99)
Glucose-Capillary: 229 mg/dL — ABNORMAL HIGH (ref 70–99)
Glucose-Capillary: 220 mg/dL — ABNORMAL HIGH (ref 70–99)
Glucose-Capillary: 249 mg/dL — ABNORMAL HIGH (ref 70–99)

## 2023-09-02 MED ORDER — GLYCOPYRROLATE 0.2 MG/ML IJ SOLN: 0.2000 mg | INTRAMUSCULAR | Status: DC | PRN

## 2023-09-02 MED ORDER — LORAZEPAM 2 MG/ML PO CONC: 2.0000 mg | ORAL | Status: DC | PRN

## 2023-09-02 MED ORDER — GLYCOPYRROLATE 1 MG PO TABS: 1.0000 mg | ORAL_TABLET | ORAL | Status: DC | PRN

## 2023-09-02 MED ORDER — MORPHINE SULFATE (CONCENTRATE) 10 MG /0.5 ML PO SOLN: 10.0000 mg | ORAL | Status: DC | PRN

## 2023-09-02 MED ORDER — IPRATROPIUM-ALBUTEROL 0.5-2.5 (3) MG/3ML IN SOLN: 3.0000 mL | Freq: Four times a day (QID) | RESPIRATORY_TRACT | Status: DC | PRN

## 2023-09-02 MED ORDER — POLYVINYL ALCOHOL 1.4 % OP SOLN: 1.0000 [drp] | Freq: Four times a day (QID) | OPHTHALMIC | Status: DC | PRN

## 2023-09-02 MED ORDER — HYDROMORPHONE HCL 1 MG/ML IJ SOLN
0.5000 mg | INTRAMUSCULAR | Status: DC | PRN
  Administered 2023-09-02: 0.5 mg via INTRAVENOUS
  Filled 2023-09-02: qty 0.5

## 2023-09-02 NOTE — Progress Notes (Signed)
[facility] Liaison Note  Follow up on new referral for home hospice.  Patient has DME in home needed for discharge.  Spoke with family last night about possible d/c today.  Daughter is not feeling well this morning and they need time to prepare to bring her mother home.    Collaboration with hospital medical team and patient will plan on discharge tomorrow morning. EMS set for 1000 pick up from [facility] by [facility] Awanya Caesar.  Hospital Liaison Team will follow through final disposition.  Thank you for allowing participation in this patient's care.  Norris Cross, RN Nurse Liaison [phone]

## 2023-09-02 NOTE — TOC Progression Note (Addendum)
Transition of Care [facility]) - Progression Note    Patient Details  Name: [name] Gilbert MRN: [id number] Date of Birth: July 31, 1961  Transition of Care [facility]) CM/SW Contact  Hetty Ely, RN Phone Number: 09/02/2023, 2:09 PM  Clinical Narrative: Maretta Los, Amy transport for 10 am discharge to home with Hospice care for tomorrow 09/03/23. Facesheet and Med Necessity taken to unit placed in discharge packet. Attempted to call family for update, no answer left message.         Expected Discharge Plan and Services                                               Social Determinants of Health (SDOH) Interventions SDOH Screenings   Food Insecurity: Patient Unable To Answer (09/01/2023)  Housing: Unknown (09/01/2023)  Transportation Needs: Unknown (09/01/2023)  Utilities: Patient Unable To Answer (09/01/2023)  Financial Resource Strain: Low Risk  (12/21/2021)   Received from [facility], [facility]  Physical Activity: Inactive (12/21/2021)   Received from [facility], [facility]  Social Connections: Socially Integrated (12/21/2021)   Received from [facility], [facility]  Stress: No Stress Concern Present (12/21/2021)   Received from [facility], [facility]  Tobacco Use: Low Risk  (09/01/2023)  Health Literacy: Low Risk  (12/16/2020)   Received from [facility], [facility]    Readmission Risk Interventions    08/31/2023    4:23 PM  Readmission Risk Prevention Plan  Transportation Screening Complete  PCP or Specialist Appt within 3-5 Days Complete  HRI or Home Care Consult Complete  Social Work Consult for Recovery Care Planning/Counseling Complete  Palliative Care Screening Complete  Medication Review [employer]) Complete

## 2023-09-02 NOTE — Plan of Care (Signed)
  Problem: Education: Goal: Ability to describe self-care measures that may prevent or decrease complications (Diabetes Survival Skills Education) will improve Outcome: Not Progressing Goal: Individualized Educational Video(s) Outcome: Not Progressing   Problem: Coping: Goal: Ability to adjust to condition or change in health will improve Outcome: Not Progressing   Problem: Fluid Volume: Goal: Ability to maintain a balanced intake and output will improve Outcome: Not Progressing   Problem: Health Behavior/Discharge Planning: Goal: Ability to identify and utilize available resources and services will improve Outcome: Not Progressing Goal: Ability to manage health-related needs will improve Outcome: Not Progressing   Problem: Metabolic: Goal: Ability to maintain appropriate glucose levels will improve Outcome: Not Progressing   Problem: Nutritional: Goal: Maintenance of adequate nutrition will improve Outcome: Not Progressing Goal: Progress toward achieving an optimal weight will improve Outcome: Not Progressing

## 2023-09-02 NOTE — Plan of Care (Signed)
Problem: Education: Goal: Ability to describe self-care measures that may prevent or decrease complications (Diabetes Survival Skills Education) will improve Outcome: Not Progressing Goal: Individualized Educational Video(s) Outcome: Not Progressing   Problem: Coping: Goal: Ability to adjust to condition or change in health will improve Outcome: Not Progressing   Problem: Fluid Volume: Goal: Ability to maintain a balanced intake and output will improve Outcome: Not Progressing   Problem: Health Behavior/Discharge Planning: Goal: Ability to identify and utilize available resources and services will improve Outcome: Not Progressing Goal: Ability to manage health-related needs will improve Outcome: Not Progressing   Problem: Metabolic: Goal: Ability to maintain appropriate glucose levels will improve Outcome: Not Progressing   Problem: Nutritional: Goal: Maintenance of adequate nutrition will improve Outcome: Not Progressing Goal: Progress toward achieving an optimal weight will improve Outcome: Not Progressing   Problem: Skin Integrity: Goal: Risk for impaired skin integrity will decrease Outcome: Not Progressing   Problem: Tissue Perfusion: Goal: Adequacy of tissue perfusion will improve Outcome: Not Progressing   Problem: Education: Goal: Knowledge of disease or condition will improve Outcome: Not Progressing Goal: Knowledge of secondary prevention will improve (MUST DOCUMENT ALL) Outcome: Not Progressing Goal: Knowledge of patient specific risk factors will improve Loraine Leriche N/A or DELETE if not current risk factor) Outcome: Not Progressing   Problem: Ischemic Stroke/TIA Tissue Perfusion: Goal: Complications of ischemic stroke/TIA will be minimized Outcome: Not Progressing   Problem: Coping: Goal: Will verbalize positive feelings about self Outcome: Not Progressing Goal: Will identify appropriate support needs Outcome: Not Progressing   Problem: Health  Behavior/Discharge Planning: Goal: Ability to manage health-related needs will improve Outcome: Not Progressing Goal: Goals will be collaboratively established with patient/family Outcome: Not Progressing   Problem: Self-Care: Goal: Ability to participate in self-care as condition permits will improve Outcome: Not Progressing Goal: Verbalization of feelings and concerns over difficulty with self-care will improve Outcome: Not Progressing Goal: Ability to communicate needs accurately will improve Outcome: Not Progressing   Problem: Nutrition: Goal: Risk of aspiration will decrease Outcome: Not Progressing Goal: Dietary intake will improve Outcome: Not Progressing   Problem: Education: Goal: Knowledge of General Education information will improve Description: Including pain rating scale, medication(s)/side effects and non-pharmacologic comfort measures Outcome: Not Progressing   Problem: Health Behavior/Discharge Planning: Goal: Ability to manage health-related needs will improve Outcome: Not Progressing   Problem: Clinical Measurements: Goal: Ability to maintain clinical measurements within normal limits will improve Outcome: Not Progressing Goal: Will remain free from infection Outcome: Not Progressing Goal: Diagnostic test results will improve Outcome: Not Progressing Goal: Respiratory complications will improve Outcome: Not Progressing Goal: Cardiovascular complication will be avoided Outcome: Not Progressing   Problem: Activity: Goal: Risk for activity intolerance will decrease Outcome: Not Progressing   Problem: Nutrition: Goal: Adequate nutrition will be maintained Outcome: Not Progressing   Problem: Coping: Goal: Level of anxiety will decrease Outcome: Not Progressing   Problem: Elimination: Goal: Will not experience complications related to bowel motility Outcome: Not Progressing Goal: Will not experience complications related to urinary  retention Outcome: Not Progressing   Problem: Pain Management: Goal: General experience of comfort will improve Outcome: Not Progressing   Problem: Safety: Goal: Ability to remain free from injury will improve Outcome: Not Progressing   Problem: Skin Integrity: Goal: Risk for impaired skin integrity will decrease Outcome: Not Progressing   Problem: Education: Goal: Knowledge of the prescribed therapeutic regimen will improve Outcome: Not Progressing   Problem: Coping: Goal: Ability to identify and develop effective coping behavior will  improve Outcome: Not Progressing   Problem: Clinical Measurements: Goal: Quality of life will improve Outcome: Not Progressing   Problem: Respiratory: Goal: Verbalizations of increased ease of respirations will increase Outcome: Not Progressing   Problem: Role Relationship: Goal: Family's ability to cope with current situation will improve Outcome: Not Progressing Goal: Ability to verbalize concerns, feelings, and thoughts to partner or family member will improve Outcome: Not Progressing   Problem: Pain Management: Goal: Satisfaction with pain management regimen will improve Outcome: Not Progressing

## 2023-09-02 NOTE — Progress Notes (Signed)
Progress Note   Patient: [name] Gilbert ZOX:[id number] DOB: 06/03/1961 DOA: 08/30/2023     3 DOS: the patient was seen and examined on 09/02/2023   Brief hospital course: Armando Ullery is a 62 year old female with past medical history of recurrent CVAs, seizures, HLD, HTN, T2DM, expressive aphasia, liver cirrhosis, CKD stage IV, bed bound, at baseline patient with left-sided deficit, aphasia, lower extremity rigidity presented to ED for poor interaction than usual, decreased p.o. intake, worsening right upper extremity contracture.   CT head shows acute/early subacute left anterior cerebral artery distribution infarct. Admitted to hospitalist service for further management and evaluation of stroke. Patient is previously followed by hospice services at home.  Assessment and Plan: Acute CVA Right sided hemiplegia, aphasia. Palliative team discussed with family who agreed with to initiate hospice care services. Neurology team did talk to family advised hospice care. Mental status poor, unable to swallow, pleasure feeds only.  Seizure disorder: Keppra 500mg  q12hr as seizures can be uncomfortable. Seizure precautions.  Hypertension Hyperlipidemia Liver cirrhosis Type 2 Diabetes  Palliative care order set placed. Continue only supportive care. Hold off accucheks, labs and frequent vitals. Pleasure feeds. Discharge home with hospice tomorrow. Discussed with family at bedside.     Nursing supportive care. Fall, aspiration precautions. DVT prophylaxis   Code Status: Do not attempt resuscitation (DNR) - Comfort care  Subjective: Patient is seen and examined today morning in ED. Mental status poor. Family at bedside. Agreed to take her home with hospice services.  Physical Exam: Vitals:   09/01/23 2051 09/02/23 0111 09/02/23 0519 09/02/23 0833  BP: (!) 168/90 (!) 187/82 (!) 154/83 (!) 156/91  Pulse: 92 (!) 102 91 (!) 125  Resp: 18 16 18 19   Temp: (!) 97.3 F (36.3 C) (!)  97.5 F (36.4 C) 97.8 F (36.6 C) (!) 100.5 F (38.1 C)  TempSrc:      SpO2: 99% 100% 100% 100%    General - Elderly African American female, no distress HEENT - PERRLA, EOMI, atraumatic head, non tender sinuses. Lung - Clear, diffuse rales, rhonchi. Heart - S1, S2 heard, no murmurs, rubs, trace pedal edema. Abdomen - soft, non tender, no distention. Neuro - lethargic, did not follow commands, unable to do full neuro exam. Skin - Warm and dry.  Data Reviewed:      Latest Ref Rng & Units 08/31/2023    8:49 AM 08/30/2023    5:30 PM 03/14/2023    3:09 PM  CBC  WBC 4.0 - 10.5 K/uL 7.5  7.0  7.6   Hemoglobin 12.0 - 15.0 g/dL 81.1  91.4  78.2   Hematocrit 36.0 - 46.0 % 36.0  41.7  35.9   Platelets 150 - 400 K/uL 257  293  327       Latest Ref Rng & Units 08/31/2023    8:49 AM 08/30/2023    5:30 PM 03/14/2023    3:09 PM  BMP  Glucose 70 - 99 mg/dL 956  213  086   BUN 8 - 23 mg/dL 25  28  36   Creatinine 0.44 - 1.00 mg/dL 5.78  4.69  6.29   Sodium 135 - 145 mmol/L 138  140  138   Potassium 3.5 - 5.1 mmol/L 3.8  3.8  4.4   Chloride 98 - 111 mmol/L 104  102  103   CO2 22 - 32 mmol/L 23  25  23    Calcium 8.9 - 10.3 mg/dL 9.1  9.8  9.2    EEG  adult Result Date: 08/31/2023 Charlsie Quest, MD     08/31/2023  2:24 PM Patient Name: [name] Gilbert MRN: [id number] Epilepsy Attending: Charlsie Quest Referring Physician/Provider: Gery Pray, MD Date: 08/31/2023 Duration: Patient history: 62yo F with ams getting eeg to evaluate for seizure Level of alertness: Awake AEDs during EEG study: LEV Technical aspects: This EEG study was done with scalp electrodes positioned according to the 10-20 International system of electrode placement. Electrical activity was reviewed with band pass filter of 1-70Hz , sensitivity of 7 uV/mm, display speed of 14mm/sec with a 60Hz  notched filter applied as appropriate. EEG data were recorded continuously and digitally stored.  Video monitoring was available  and reviewed as appropriate. Description: EEG showed continuous generalized 3 to 6 Hz theta-delta slowing. Generalized periodic discharges with triphasic morphology at 1-1.5 Hz were also noted. Hyperventilation and photic stimulation were not performed.   ABNORMALITY - Periodic discharges with triphasic morphology, generalized ( GPDs) - Continuous slow, generalized IMPRESSION: This study is suggestive of moderate diffuse encephalopathy, likely related to toxic-metabolic causes. No seizures or definite epileptiform discharges were seen throughout the recording. Priyanka Annabelle Harman     Family Communication: Discussed with family at bedside, they understand and agree. All questions answereed.  Disposition: Status is: Inpatient Remains inpatient appropriate because: home with hospice services  Planned Discharge Destination:  Home with hospice     Time spent: 37 minutes  Author: Marcelino Duster, MD 09/02/2023 1:56 PM Secure chat 7am to 7pm For on call review [email or web address].

## 2023-09-03 DIAGNOSIS — I639 Cerebral infarction, unspecified: Secondary | ICD-10-CM | POA: Diagnosis not present

## 2023-09-03 MED ORDER — MORPHINE SULFATE (CONCENTRATE) 10 MG /0.5 ML PO SOLN: 10.0000 mg | ORAL | 0 refills | Status: AC | PRN

## 2023-09-03 MED ORDER — POLYVINYL ALCOHOL 1.4 % OP SOLN: 1.0000 [drp] | Freq: Four times a day (QID) | OPHTHALMIC | Status: AC | PRN

## 2023-09-03 MED ORDER — ORAL CARE MOUTH RINSE: 15.0000 mL | OROMUCOSAL | Status: DC | PRN

## 2023-09-03 MED ORDER — MORPHINE SULFATE (CONCENTRATE) 10 MG /0.5 ML PO SOLN: 10.0000 mg | ORAL | 0 refills | Status: DC | PRN

## 2023-09-03 NOTE — Care Management Important Message (Signed)
Important Message  Patient Details  Name: [name] Gilbert MRN: [id number] Date of Birth: 08-Jul-1961   Important Message Given:  Yes - Medicare IM     Bernadette Hoit 09/03/2023, 11:08 AM

## 2023-09-03 NOTE — TOC Transition Note (Signed)
Transition of Care [facility]) - Discharge Note   Patient Details  Name: [name] Gilbert MRN: [id number] Date of Birth: 01-04-1961  [facility] CM/SW Contact:  Allena Katz, LCSW Phone Number: 09/03/2023, 12:16 PM   Clinical Narrative:   Pt has orders to discharge home today with home hospice through [facility]. Previous CM for weekend has arranged ems transport for today at 10. Medical necessity and DNR all on chart.     Final next level of care: Home w Hospice Care Barriers to Discharge: Barriers Resolved   Patient Goals and CMS Choice Patient states their goals for this hospitalization and ongoing recovery are:: go home with hospice CMS Medicare.gov Compare Post Acute Care list provided to:: Patient        Discharge Placement                Patient to be transferred to facility by: [facility] Name of family member notified: [name] Patient and family notified of of transfer: 09/03/23  Discharge Plan and Services Additional resources added to the After Visit Summary for                                       Social Drivers of Health (SDOH) Interventions SDOH Screenings   Food Insecurity: Patient Unable To Answer (09/01/2023)  Housing: Unknown (09/01/2023)  Transportation Needs: Unknown (09/01/2023)  Utilities: Patient Unable To Answer (09/01/2023)  Financial Resource Strain: Low Risk  (12/21/2021)   Received from [facility], [facility]  Physical Activity: Inactive (12/21/2021)   Received from [facility], [facility]  Social Connections: Socially Integrated (12/21/2021)   Received from [facility], [facility]  Stress: No Stress Concern Present (12/21/2021)   Received from [facility], [facility]  Tobacco Use: Low Risk  (09/01/2023)  Health Literacy: Low Risk  (12/16/2020)   Received from [facility], [facility]     Readmission Risk Interventions    08/31/2023    4:23 PM  Readmission Risk  Prevention Plan  Transportation Screening Complete  PCP or Specialist Appt within 3-5 Days Complete  HRI or Home Care Consult Complete  Social Work Consult for Recovery Care Planning/Counseling Complete  Palliative Care Screening Complete  Medication Review Oceanographer) Complete

## 2023-09-03 NOTE — Discharge Summary (Signed)
Physician Discharge Summary   [name] Gilbert  female DOB: July 02, 1961  ZOX:[id number]  PCP: Tobey Grim, MD  Admit date: 08/30/2023 Discharge date: 09/03/2023  Admitted From: home Disposition:  home with hospice Husband and daughter updated at bedside prior to discharge.  CODE STATUS: DNR Comfort care   Hospital Course:  For full details, please see H&P, progress notes, consult notes and ancillary notes.  Briefly,  [name] is a 62 year old female with past medical history of recurrent CVAs, seizures, HLD, HTN, T2DM, expressive aphasia, liver cirrhosis, CKD stage IV, bed bound, at baseline patient with left-sided deficit, aphasia, lower extremity rigidity presented to ED for poor interaction than usual, decreased p.o. intake, worsening right upper extremity contracture.    CT head showed acute/early subacute left anterior cerebral artery distribution infarct. Admitted to hospitalist service for further management and evaluation of stroke. Patient is previously followed by hospice services at home.   Acute CVA Right sided hemiplegia, aphasia. Palliative team discussed with family who agreed to initiate hospice care services. Neurology team did talk to family advised hospice care. Mental status poor, unable to swallow, pleasure feeds only.   Seizure disorder Hypertension Hyperlipidemia Liver cirrhosis Type 2 Diabetes    Discharge Diagnoses:  Principal Problem:   Acute CVA (cerebrovascular accident) (HCC) Active Problems:   CVA (cerebral vascular accident) (HCC)   Essential hypertension   Type II diabetes mellitus with renal manifestations (HCC)   Dyslipidemia   Seizure disorder (HCC)   Stage 3b chronic kidney disease (CKD) (HCC)   Expressive aphasia   Liver cirrhosis (HCC)   30 Day Unplanned Readmission Risk Score    Flowsheet Row ED to Hosp-Admission (Current) from 08/30/2023 in [facility] 1C MEDICAL TELEMETRY  30 Day  Unplanned Readmission Risk Score (%) 27.59 Filed at 09/03/2023 0801       This score is the patient's risk of an unplanned readmission within 30 days of being discharged (0 -100%). The score is based on dignosis, age, lab data, medications, orders, and past utilization.   Low:  0-14.9   Medium: 15-21.9   High: 22-29.9   Extreme: 30 and above         Discharge Instructions:  Allergies as of 09/03/2023       Reactions   Metformin Other (See Comments)   Reaction: unknown   Penicillins Hives, Nausea And Vomiting, Swelling   Has patient had a PCN reaction causing immediate rash, facial/tongue/throat swelling, SOB or lightheadedness with hypotension: Yes Has patient had a PCN reaction causing severe rash involving mucus membranes or skin necrosis: No Has patient had a PCN reaction that required hospitalization Yes Has patient had a PCN reaction occurring within the last 10 years: No If all of the above answers are "NO", then may proceed with Cephalosporin use.   Codeine Other (See Comments)   Reaction: HIVES   Hydralazine    Stomach pain   Statins Other (See Comments)   Myopathy with atorva 80        Medication List     STOP taking these medications    acetaminophen 325 MG tablet Commonly known as: TYLENOL   amLODipine 10 MG tablet Commonly known as: NORVASC   B-complex with vitamin C tablet   clopidogrel 75 MG tablet Commonly known as: PLAVIX   ezetimibe 10 MG tablet Commonly known as: ZETIA   Insulin Aspart FlexPen 100 UNIT/ML Commonly known as: NOVOLOG   isosorbide mononitrate 10 MG tablet Commonly known as: ISMO  KP B Complex-C Tabs   levETIRAcetam 250 MG tablet Commonly known as: KEPPRA   metoprolol succinate 25 MG 24 hr tablet Commonly known as: TOPROL-XL   polyethylene glycol 17 g packet Commonly known as: MIRALAX / GLYCOLAX   rosuvastatin 40 MG tablet Commonly known as: CRESTOR   senna-docusate 8.6-50 MG tablet Commonly known as:  Senokot-S   Senokot 8.6 MG tablet Generic drug: senna   Tresiba FlexTouch 100 UNIT/ML FlexTouch Pen Generic drug: insulin degludec   Vitamin D (Ergocalciferol) 1.25 MG (50000 UNIT) Caps capsule Commonly known as: DRISDOL       TAKE these medications    morphine CONCENTRATE 10 mg / 0.5 ml concentrated solution Place 0.5 mLs (10 mg total) under the tongue every 3 (three) hours as needed for severe pain (pain score 7-10) or shortness of breath.   polyvinyl alcohol 1.4 % ophthalmic solution Commonly known as: LIQUIFILM TEARS Place 1 drop into both eyes 4 (four) times daily as needed for dry eyes.          Allergies  Allergen Reactions   Metformin Other (See Comments)    Reaction: unknown   Penicillins Hives, Nausea And Vomiting and Swelling    Has patient had a PCN reaction causing immediate rash, facial/tongue/throat swelling, SOB or lightheadedness with hypotension: Yes Has patient had a PCN reaction causing severe rash involving mucus membranes or skin necrosis: No Has patient had a PCN reaction that required hospitalization Yes Has patient had a PCN reaction occurring within the last 10 years: No If all of the above answers are "NO", then may proceed with Cephalosporin use.    Codeine Other (See Comments)    Reaction: HIVES   Hydralazine     Stomach pain   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     The results of significant diagnostics from this hospitalization (including imaging, microbiology, ancillary and laboratory) are listed below for reference.   Consultations:   Procedures/Studies: EEG adult Result Date: 08/31/2023 Charlsie Quest, MD     08/31/2023  2:24 PM Patient Name: [name] Gilbert MRN: [id number] Epilepsy Attending: Charlsie Quest Referring Physician/Provider: Gery Pray, MD Date: 08/31/2023 Duration: Patient history: 62yo F with ams getting eeg to evaluate for seizure Level of alertness: Awake AEDs during EEG study:  LEV Technical aspects: This EEG study was done with scalp electrodes positioned according to the 10-20 International system of electrode placement. Electrical activity was reviewed with band pass filter of 1-70Hz , sensitivity of 7 uV/mm, display speed of 54mm/sec with a 60Hz  notched filter applied as appropriate. EEG data were recorded continuously and digitally stored.  Video monitoring was available and reviewed as appropriate. Description: EEG showed continuous generalized 3 to 6 Hz theta-delta slowing. Generalized periodic discharges with triphasic morphology at 1-1.5 Hz were also noted. Hyperventilation and photic stimulation were not performed.   ABNORMALITY - Periodic discharges with triphasic morphology, generalized ( GPDs) - Continuous slow, generalized IMPRESSION: This study is suggestive of moderate diffuse encephalopathy, likely related to toxic-metabolic causes. No seizures or definite epileptiform discharges were seen throughout the recording. Charlsie Quest   ECHOCARDIOGRAM COMPLETE Result Date: 08/31/2023    ECHOCARDIOGRAM REPORT   Patient Name:   BASEEMAH LONGCOR Date of Exam: 08/31/2023 Medical Rec #:  [id number]        Height:       62.0 in Accession #:    [id number]       Weight:       138.9  lb Date of Birth:  [birth year]        BSA:          1.637 m Patient Age:    62 years         BP:           111/64 mmHg Patient Gender: F                HR:           96 bpm. Exam Location:  [facility] Procedure: 2D Echo, Cardiac Doppler and Color Doppler Indications:     TIA  History:         Patient has prior history of Echocardiogram examinations, most                  recent 10/15/2022. Stroke and TIA; Risk Factors:Hypertension,                  Diabetes, Dyslipidemia and Non-Smoker. CKD, TDS-Patient's right                  arm is contracted over her chest.  Sonographer:     Mikki Harbor Referring Phys:  [id number] DEBBY CROSLEY Diagnosing Phys: Debbe Odea MD  Sonographer Comments: Technically  challenging study due to limited acoustic windows, suboptimal parasternal window and suboptimal apical window. IMPRESSIONS  1. Left ventricular ejection fraction, by estimation, is 60 to 65%. The left ventricle has normal function. The left ventricle has no regional wall motion abnormalities. Left ventricular diastolic parameters are consistent with Grade I diastolic dysfunction (impaired relaxation).  2. Right ventricular systolic function is normal. The right ventricular size is normal. There is normal pulmonary artery systolic pressure.  3. The mitral valve is normal in structure. No evidence of mitral valve regurgitation.  4. The aortic valve was not well visualized. Aortic valve regurgitation is not visualized.  5. The inferior vena cava is normal in size with greater than 50% respiratory variability, suggesting right atrial pressure of 3 mmHg. FINDINGS  Left Ventricle: Left ventricular ejection fraction, by estimation, is 60 to 65%. The left ventricle has normal function. The left ventricle has no regional wall motion abnormalities. The left ventricular internal cavity size was normal in size. There is  no left ventricular hypertrophy. Left ventricular diastolic parameters are consistent with Grade I diastolic dysfunction (impaired relaxation). Right Ventricle: The right ventricular size is normal. No increase in right ventricular wall thickness. Right ventricular systolic function is normal. There is normal pulmonary artery systolic pressure. The tricuspid regurgitant velocity is 1.44 m/s, and  with an assumed right atrial pressure of 3 mmHg, the estimated right ventricular systolic pressure is 11.3 mmHg. Left Atrium: Left atrial size was normal in size. Right Atrium: Right atrial size was normal in size. Pericardium: There is no evidence of pericardial effusion. Mitral Valve: The mitral valve is normal in structure. No evidence of mitral valve regurgitation. MV peak gradient, 4.3 mmHg. The mean mitral valve  gradient is 2.0 mmHg. Tricuspid Valve: The tricuspid valve is normal in structure. Tricuspid valve regurgitation is not demonstrated. Aortic Valve: The aortic valve was not well visualized. Aortic valve regurgitation is not visualized. Aortic valve mean gradient measures 2.0 mmHg. Aortic valve peak gradient measures 4.5 mmHg. Aortic valve area, by VTI measures 2.17 cm. Pulmonic Valve: The pulmonic valve was not well visualized. Pulmonic valve regurgitation is not visualized. Aorta: The aortic root is normal in size and structure. Venous: The inferior vena cava is normal in size with  greater than 50% respiratory variability, suggesting right atrial pressure of 3 mmHg. IAS/Shunts: No atrial level shunt detected by color flow Doppler.  LEFT VENTRICLE PLAX 2D LVIDd:         3.30 cm   Diastology LVIDs:         1.90 cm   LV e' medial:    5.33 cm/s LV PW:         0.80 cm   LV E/e' medial:  8.0 LV IVS:        0.90 cm   LV e' lateral:   5.00 cm/s LVOT diam:     1.90 cm   LV E/e' lateral: 8.5 LV SV:         45 LV SV Index:   27 LVOT Area:     2.84 cm  RIGHT VENTRICLE RV Basal diam:  2.50 cm RV Mid diam:    2.40 cm RV S prime:     15.30 cm/s LEFT ATRIUM             Index        RIGHT ATRIUM          Index LA diam:        2.30 cm 1.40 cm/m   RA Area:     7.67 cm LA Vol (A2C):   19.4 ml 11.85 ml/m  RA Volume:   14.80 ml 9.04 ml/m LA Vol (A4C):   18.5 ml 11.30 ml/m LA Biplane Vol: 20.2 ml 12.34 ml/m  AORTIC VALVE                    PULMONIC VALVE AV Area (Vmax):    2.46 cm     PV Vmax:       1.04 m/s AV Area (Vmean):   2.20 cm     PV Peak grad:  4.3 mmHg AV Area (VTI):     2.17 cm AV Vmax:           106.00 cm/s AV Vmean:          71.500 cm/s AV VTI:            0.205 m AV Peak Grad:      4.5 mmHg AV Mean Grad:      2.0 mmHg LVOT Vmax:         91.80 cm/s LVOT Vmean:        55.400 cm/s LVOT VTI:          0.157 m LVOT/AV VTI ratio: 0.77  AORTA Ao Root diam: 2.70 cm MITRAL VALVE               TRICUSPID VALVE MV Area (PHT):  5.66 cm    TR Peak grad:   8.3 mmHg MV Area VTI:   2.93 cm    TR Vmax:        144.00 cm/s MV Peak grad:  4.3 mmHg MV Mean grad:  2.0 mmHg    SHUNTS MV Vmax:       1.04 m/s    Systemic VTI:  0.16 m MV Vmean:      55.7 cm/s   Systemic Diam: 1.90 cm MV Decel Time: 134 msec MV E velocity: 42.40 cm/s MV A velocity: 96.90 cm/s MV E/A ratio:  0.44 Debbe Odea MD Electronically signed by Debbe Odea MD Signature Date/Time: 08/31/2023/1:13:28 PM    Final    CT ANGIO HEAD NECK W WO CM Result Date: 08/31/2023 CLINICAL DATA:  Right upper extremity weakness EXAM:  CT ANGIOGRAPHY HEAD AND NECK WITH AND WITHOUT CONTRAST TECHNIQUE: Multidetector CT imaging of the head and neck was performed using the standard protocol during bolus administration of intravenous contrast. Multiplanar CT image reconstructions and MIPs were obtained to evaluate the vascular anatomy. Carotid stenosis measurements (when applicable) are obtained utilizing NASCET criteria, using the distal internal carotid diameter as the denominator. RADIATION DOSE REDUCTION: This exam was performed according to the departmental dose-optimization program which includes automated exposure control, adjustment of the mA and/or kV according to patient size and/or use of iterative reconstruction technique. CONTRAST:  60mL OMNIPAQUE IOHEXOL 350 MG/ML SOLN COMPARISON:  Head CT 08/30/2023, brain MRI 08/31/2023, CTA head neck 02/25/2023 FINDINGS: CTA NECK FINDINGS Skeleton: No acute abnormality or high grade bony spinal canal stenosis. Other neck: Normal pharynx, larynx and major salivary glands. No cervical lymphadenopathy. Unremarkable thyroid gland. Upper chest: No pneumothorax or pleural effusion. No nodules or masses. Aortic arch: There is no calcific atherosclerosis of the aortic arch. Conventional 3 vessel aortic branching pattern. RIGHT carotid system: Normal without aneurysm, dissection or stenosis. LEFT carotid system: No dissection, occlusion or  aneurysm. Mild atherosclerotic calcification at the carotid bifurcation without hemodynamically significant stenosis. Generally diminutive left ICA, unchanged. Vertebral arteries: Left dominant configuration. There is no dissection, occlusion or flow-limiting stenosis to the skull base (V1-V3 segments). CTA HEAD FINDINGS POSTERIOR CIRCULATION: Vertebral arteries are normal. No proximal occlusion of the anterior or inferior cerebellar arteries. Basilar artery is normal. Superior cerebellar arteries are normal. Chronic occlusion of the left PCA. ANTERIOR CIRCULATION: Atherosclerotic calcification of the internal carotid arteries at the skull base without hemodynamically significant stenosis. There is proximal occlusion of the left anterior cerebral artery A2 segment. The right ACA is normal. Bilateral atherosclerotic irregularity without high-grade stenosis. Venous sinuses: As permitted by contrast timing, patent. Anatomic variants: None Review of the MIP images confirms the above findings. IMPRESSION: 1. Proximal occlusion of the left anterior cerebral artery A2 segment. 2. Chronic occlusion of the left PCA. 3. No hemodynamically significant stenosis of the neck. These results will be called to the ordering clinician or representative by the Radiologist Assistant, and communication documented in the PACS or [employer]. Electronically Signed   By: Deatra Robinson M.D.   On: 08/31/2023 02:06   MR BRAIN WO CONTRAST Result Date: 08/31/2023 CLINICAL DATA:  Transient ischemic attack EXAM: MRI HEAD WITHOUT CONTRAST TECHNIQUE: Multiplanar, multiecho pulse sequences of the brain and surrounding structures were obtained without intravenous contrast. COMPARISON:  None Available. FINDINGS: Brain: Abnormal diffusion restriction throughout the left anterior cerebral artery territory. Minimal diffusion abnormality within the right hemispheric white matter. Chronic microhemorrhages in a predominantly central distribution,  including the pons. Chronic petechial hemorrhage over the posterior left hemisphere. There is confluent hyperintense T2-weighted signal within the white matter. Generalized volume loss. 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 Old right parietal infarct. The midline structures are normal. Vascular: Normal flow voids. Skull and upper cervical spine: Normal calvarium and skull base. Visualized upper cervical spine and soft tissues are normal. Sinuses/Orbits:No paranasal sinus fluid levels or advanced mucosal thickening. No mastoid or middle ear effusion. Normal orbits. IMPRESSION: 1. Acute left anterior cerebral artery territory infarct. No hemorrhage or mass effect. 2. Minimal diffusion abnormality within the right hemispheric white matter, which may indicate a small focus of acute/subacute ischemia. 3. Old left PCA territory infarct and old right parietal infarct. Electronically Signed   By: Deatra Robinson M.D.   On: 08/31/2023 01:37   CT Head Wo Contrast Result Date: 08/30/2023  CLINICAL DATA:  Altered mental status EXAM: CT HEAD WITHOUT CONTRAST TECHNIQUE: Contiguous axial images were obtained from the base of the skull through the vertex without intravenous contrast. RADIATION DOSE REDUCTION: This exam was performed according to the departmental dose-optimization program which includes automated exposure control, adjustment of the mA and/or kV according to patient size and/or use of iterative reconstruction technique. COMPARISON:  03/10/2023 FINDINGS: Brain: There is an area of cytotoxic edema within the left anterior cerebral artery distribution, likely an acute/early subacute infarct. There is encephalomalacia in the posterior left hemisphere that is unchanged. There is also an old right parieto-occipital infarct. Generalized white matter changes of chronic small vessel ischemia. No acute hemorrhage. Vascular: There is atherosclerotic calcification of both internal carotid arteries at the skull base. Skull: Negative  Sinuses/Orbits: No acute finding. Other: None. IMPRESSION: 1. Acute/early subacute left anterior cerebral artery distribution infarct. No hemorrhage or mass effect. 2. Old left posterior cerebral artery and right parieto-occipital infarcts. Electronically Signed   By: Deatra Robinson M.D.   On: 08/30/2023 19:10      Labs: BNP (last 3 results) Recent Labs    10/14/22 1429  BNP 28.2   Basic Metabolic Panel: Recent Labs  Lab 08/30/23 1730 08/31/23 0849  NA 140 138  K 3.8 3.8  CL 102 104  CO2 25 23  GLUCOSE 249* 201*  BUN 28* 25*  CREATININE 1.57* 1.68*  CALCIUM 9.8 9.1   Liver Function Tests: Recent Labs  Lab 08/30/23 1730 08/31/23 0849  AST 22 27  ALT 10 11  ALKPHOS 88 72  BILITOT 1.5* 1.5*  PROT 8.6* 7.5  ALBUMIN 3.8 3.3*   No results for input(s): "LIPASE", "AMYLASE" in the last 168 hours. Recent Labs  Lab 08/31/23 0849  AMMONIA 18   CBC: Recent Labs  Lab 08/30/23 1730 08/31/23 0849  WBC 7.0 7.5  HGB 14.0 12.4  HCT 41.7 36.0  MCV 86.3 84.5  PLT 293 257   Cardiac Enzymes: No results for input(s): "CKTOTAL", "CKMB", "CKMBINDEX", "TROPONINI" in the last 168 hours. BNP: Invalid input(s): "POCBNP" CBG: Recent Labs  Lab 09/01/23 2057 09/02/23 0116 09/02/23 0521 09/02/23 0747 09/02/23 1137  GLUCAP 238* 241* 229* 220* 249*   D-Dimer No results for input(s): "DDIMER" in the last 72 hours. Hgb A1c No results for input(s): "HGBA1C" in the last 72 hours. Lipid Profile No results for input(s): "CHOL", "HDL", "LDLCALC", "TRIG", "CHOLHDL", "LDLDIRECT" in the last 72 hours.  Thyroid function studies No results for input(s): "TSH", "T4TOTAL", "T3FREE", "THYROIDAB" in the last 72 hours.  Invalid input(s): "FREET3" Anemia work up No results for input(s): "VITAMINB12", "FOLATE", "FERRITIN", "TIBC", "IRON", "RETICCTPCT" in the last 72 hours. Urinalysis    Component Value Date/Time   COLORURINE YELLOW (A) 09/01/2023 0316   APPEARANCEUR HAZY (A) 09/01/2023  0316   APPEARANCEUR Clear 12/06/2014 0133   LABSPEC >1.046 (H) 09/01/2023 0316   LABSPEC 1.011 12/06/2014 0133   PHURINE 5.0 09/01/2023 0316   GLUCOSEU 50 (A) 09/01/2023 0316   GLUCOSEU >=500 12/06/2014 0133   HGBUR SMALL (A) 09/01/2023 0316   BILIRUBINUR NEGATIVE 09/01/2023 0316   BILIRUBINUR Negative 12/06/2014 0133   KETONESUR 5 (A) 09/01/2023 0316   PROTEINUR 100 (A) 09/01/2023 0316   NITRITE NEGATIVE 09/01/2023 0316   LEUKOCYTESUR NEGATIVE 09/01/2023 0316   LEUKOCYTESUR Negative 12/06/2014 0133   Sepsis Labs Recent Labs  Lab 08/30/23 1730 08/31/23 0849  WBC 7.0 7.5   Microbiology No results found for this or any previous visit (from the  past 240 hours).   Total time spend on discharging this patient, including the last patient exam, discussing the hospital stay, instructions for ongoing care as it relates to all pertinent caregivers, as well as preparing the medical discharge records, prescriptions, and/or referrals as applicable, is 45 minutes.    Darlin Priestly, MD  [facility] 09/03/2023, 11:57 AM

## 2023-09-03 NOTE — Plan of Care (Signed)
Patient remains on comfort care measures. No signs of distress tonight. Daughter remained at bedside. Continued support given to her daughter. Will continue to monitor needs for comfort.   Problem: Education: Goal: Ability to describe self-care measures that may prevent or decrease complications (Diabetes Survival Skills Education) will improve Outcome: Not Progressing Goal: Individualized Educational Video(s) Outcome: Not Progressing   Problem: Coping: Goal: Ability to adjust to condition or change in health will improve Outcome: Not Progressing   Problem: Fluid Volume: Goal: Ability to maintain a balanced intake and output will improve Outcome: Not Progressing   Problem: Health Behavior/Discharge Planning: Goal: Ability to identify and utilize available resources and services will improve Outcome: Not Progressing Goal: Ability to manage health-related needs will improve Outcome: Not Progressing   Problem: Metabolic: Goal: Ability to maintain appropriate glucose levels will improve Outcome: Not Progressing   Problem: Nutritional: Goal: Maintenance of adequate nutrition will improve Outcome: Not Progressing Goal: Progress toward achieving an optimal weight will improve Outcome: Not Progressing   Problem: Skin Integrity: Goal: Risk for impaired skin integrity will decrease Outcome: Not Progressing   Problem: Tissue Perfusion: Goal: Adequacy of tissue perfusion will improve Outcome: Not Progressing   Problem: Education: Goal: Knowledge of disease or condition will improve Outcome: Not Progressing Goal: Knowledge of secondary prevention will improve (MUST DOCUMENT ALL) Outcome: Not Progressing Goal: Knowledge of patient specific risk factors will improve Loraine Leriche N/A or DELETE if not current risk factor) Outcome: Not Progressing   Problem: Ischemic Stroke/TIA Tissue Perfusion: Goal: Complications of ischemic stroke/TIA will be minimized Outcome: Not Progressing    Problem: Coping: Goal: Will verbalize positive feelings about self Outcome: Not Progressing Goal: Will identify appropriate support needs Outcome: Not Progressing   Problem: Health Behavior/Discharge Planning: Goal: Ability to manage health-related needs will improve Outcome: Not Progressing Goal: Goals will be collaboratively established with patient/family Outcome: Not Progressing   Problem: Self-Care: Goal: Ability to participate in self-care as condition permits will improve Outcome: Not Progressing Goal: Verbalization of feelings and concerns over difficulty with self-care will improve Outcome: Not Progressing Goal: Ability to communicate needs accurately will improve Outcome: Not Progressing   Problem: Nutrition: Goal: Risk of aspiration will decrease Outcome: Not Progressing Goal: Dietary intake will improve Outcome: Not Progressing   Problem: Education: Goal: Knowledge of General Education information will improve Description: Including pain rating scale, medication(s)/side effects and non-pharmacologic comfort measures Outcome: Not Progressing   Problem: Health Behavior/Discharge Planning: Goal: Ability to manage health-related needs will improve Outcome: Not Progressing   Problem: Clinical Measurements: Goal: Ability to maintain clinical measurements within normal limits will improve Outcome: Not Progressing Goal: Will remain free from infection Outcome: Not Progressing Goal: Diagnostic test results will improve Outcome: Not Progressing Goal: Respiratory complications will improve Outcome: Not Progressing Goal: Cardiovascular complication will be avoided Outcome: Not Progressing   Problem: Activity: Goal: Risk for activity intolerance will decrease Outcome: Not Progressing   Problem: Nutrition: Goal: Adequate nutrition will be maintained Outcome: Not Progressing   Problem: Coping: Goal: Level of anxiety will decrease Outcome: Not Progressing    Problem: Elimination: Goal: Will not experience complications related to bowel motility Outcome: Not Progressing Goal: Will not experience complications related to urinary retention Outcome: Not Progressing   Problem: Pain Management: Goal: General experience of comfort will improve Outcome: Not Progressing   Problem: Safety: Goal: Ability to remain free from injury will improve Outcome: Not Progressing   Problem: Skin Integrity: Goal: Risk for impaired skin integrity will decrease Outcome: Not Progressing  Problem: Education: Goal: Knowledge of the prescribed therapeutic regimen will improve Outcome: Not Progressing   Problem: Coping: Goal: Ability to identify and develop effective coping behavior will improve Outcome: Not Progressing   Problem: Clinical Measurements: Goal: Quality of life will improve Outcome: Not Progressing   Problem: Respiratory: Goal: Verbalizations of increased ease of respirations will increase Outcome: Not Progressing   Problem: Role Relationship: Goal: Family's ability to cope with current situation will improve Outcome: Not Progressing Goal: Ability to verbalize concerns, feelings, and thoughts to partner or family member will improve Outcome: Not Progressing   Problem: Pain Management: Goal: Satisfaction with pain management regimen will improve Outcome: Not Progressing
